# Patient Record
Sex: Female | Born: 1935 | Race: White | Hispanic: No | Smoking: Former smoker
Health system: Southern US, Community
[De-identification: ages and names within clinical notes are randomized; demographics above are authoritative.]

## PROBLEM LIST (undated history)

## (undated) DIAGNOSIS — E785 Hyperlipidemia, unspecified: Secondary | ICD-10-CM

## (undated) DIAGNOSIS — F419 Anxiety disorder, unspecified: Secondary | ICD-10-CM

## (undated) DIAGNOSIS — Z923 Personal history of irradiation: Secondary | ICD-10-CM

## (undated) DIAGNOSIS — I5022 Chronic systolic (congestive) heart failure: Secondary | ICD-10-CM

## (undated) DIAGNOSIS — K219 Gastro-esophageal reflux disease without esophagitis: Secondary | ICD-10-CM

## (undated) DIAGNOSIS — D649 Anemia, unspecified: Secondary | ICD-10-CM

## (undated) DIAGNOSIS — J849 Interstitial pulmonary disease, unspecified: Secondary | ICD-10-CM

## (undated) DIAGNOSIS — M8580 Other specified disorders of bone density and structure, unspecified site: Secondary | ICD-10-CM

## (undated) DIAGNOSIS — I255 Ischemic cardiomyopathy: Secondary | ICD-10-CM

## (undated) DIAGNOSIS — R55 Syncope and collapse: Secondary | ICD-10-CM

## (undated) DIAGNOSIS — C50919 Malignant neoplasm of unspecified site of unspecified female breast: Secondary | ICD-10-CM

## (undated) DIAGNOSIS — I2510 Atherosclerotic heart disease of native coronary artery without angina pectoris: Secondary | ICD-10-CM

## (undated) DIAGNOSIS — I10 Essential (primary) hypertension: Secondary | ICD-10-CM

## (undated) HISTORY — DX: Interstitial pulmonary disease, unspecified: J84.9

## (undated) HISTORY — DX: Other specified disorders of bone density and structure, unspecified site: M85.80

## (undated) HISTORY — PX: OVARIAN CYST SURGERY: SHX726

## (undated) HISTORY — DX: Chronic systolic (congestive) heart failure: I50.22

## (undated) HISTORY — DX: Anxiety disorder, unspecified: F41.9

## (undated) HISTORY — PX: CHOLECYSTECTOMY: SHX55

## (undated) HISTORY — DX: Hyperlipidemia, unspecified: E78.5

## (undated) HISTORY — DX: Anemia, unspecified: D64.9

## (undated) HISTORY — DX: Essential (primary) hypertension: I10

## (undated) HISTORY — DX: Syncope and collapse: R55

## (undated) HISTORY — DX: Ischemic cardiomyopathy: I25.5

## (undated) HISTORY — PX: APPENDECTOMY: SHX54

## (undated) HISTORY — DX: Gastro-esophageal reflux disease without esophagitis: K21.9

## (undated) HISTORY — DX: Atherosclerotic heart disease of native coronary artery without angina pectoris: I25.10

---

## 2001-07-02 ENCOUNTER — Other Ambulatory Visit: Admission: RE | Admit: 2001-07-02 | Discharge: 2001-07-02 | Payer: Self-pay | Admitting: Family Medicine

## 2004-03-11 HISTORY — PX: FEMUR FRACTURE SURGERY: SHX633

## 2004-07-13 ENCOUNTER — Other Ambulatory Visit: Payer: Self-pay

## 2004-07-13 ENCOUNTER — Inpatient Hospital Stay: Payer: Self-pay | Admitting: Specialist

## 2004-11-05 ENCOUNTER — Ambulatory Visit: Payer: Self-pay | Admitting: Family Medicine

## 2004-11-05 ENCOUNTER — Other Ambulatory Visit: Admission: RE | Admit: 2004-11-05 | Discharge: 2004-11-05 | Payer: Self-pay | Admitting: Family Medicine

## 2004-11-06 ENCOUNTER — Encounter: Payer: Self-pay | Admitting: Family Medicine

## 2004-11-06 LAB — CONVERTED CEMR LAB: Pap Smear: NORMAL

## 2004-11-08 ENCOUNTER — Ambulatory Visit: Payer: Self-pay | Admitting: Family Medicine

## 2004-11-13 ENCOUNTER — Ambulatory Visit: Payer: Self-pay | Admitting: Family Medicine

## 2004-11-26 LAB — FECAL OCCULT BLOOD, GUAIAC: Fecal Occult Blood: NEGATIVE

## 2004-11-27 ENCOUNTER — Ambulatory Visit: Payer: Self-pay | Admitting: Family Medicine

## 2004-12-19 ENCOUNTER — Ambulatory Visit: Payer: Self-pay | Admitting: Family Medicine

## 2005-01-30 ENCOUNTER — Ambulatory Visit: Payer: Self-pay | Admitting: Family Medicine

## 2005-03-01 ENCOUNTER — Ambulatory Visit: Payer: Self-pay | Admitting: Family Medicine

## 2005-04-09 ENCOUNTER — Ambulatory Visit: Payer: Self-pay | Admitting: Family Medicine

## 2005-05-10 ENCOUNTER — Ambulatory Visit: Payer: Self-pay | Admitting: Family Medicine

## 2005-06-26 ENCOUNTER — Ambulatory Visit: Payer: Self-pay | Admitting: Family Medicine

## 2005-08-07 ENCOUNTER — Ambulatory Visit: Payer: Self-pay | Admitting: Family Medicine

## 2005-09-09 ENCOUNTER — Ambulatory Visit: Payer: Self-pay | Admitting: Family Medicine

## 2005-10-10 ENCOUNTER — Ambulatory Visit: Payer: Self-pay | Admitting: Family Medicine

## 2005-11-25 ENCOUNTER — Ambulatory Visit: Payer: Self-pay | Admitting: Family Medicine

## 2005-12-30 ENCOUNTER — Ambulatory Visit: Payer: Self-pay | Admitting: Family Medicine

## 2006-01-20 ENCOUNTER — Ambulatory Visit: Payer: Self-pay | Admitting: Family Medicine

## 2006-01-28 ENCOUNTER — Ambulatory Visit: Payer: Self-pay | Admitting: Family Medicine

## 2006-04-01 ENCOUNTER — Ambulatory Visit: Payer: Self-pay | Admitting: Family Medicine

## 2006-04-08 ENCOUNTER — Ambulatory Visit: Payer: Self-pay | Admitting: Family Medicine

## 2006-04-11 ENCOUNTER — Ambulatory Visit: Payer: Self-pay | Admitting: Family Medicine

## 2006-07-01 ENCOUNTER — Ambulatory Visit: Payer: Self-pay | Admitting: Family Medicine

## 2006-09-24 ENCOUNTER — Encounter: Payer: Self-pay | Admitting: Family Medicine

## 2006-09-24 DIAGNOSIS — E538 Deficiency of other specified B group vitamins: Secondary | ICD-10-CM | POA: Insufficient documentation

## 2006-09-24 DIAGNOSIS — M810 Age-related osteoporosis without current pathological fracture: Secondary | ICD-10-CM | POA: Insufficient documentation

## 2006-09-24 DIAGNOSIS — J309 Allergic rhinitis, unspecified: Secondary | ICD-10-CM | POA: Insufficient documentation

## 2006-09-24 DIAGNOSIS — K219 Gastro-esophageal reflux disease without esophagitis: Secondary | ICD-10-CM | POA: Insufficient documentation

## 2006-09-24 DIAGNOSIS — I872 Venous insufficiency (chronic) (peripheral): Secondary | ICD-10-CM | POA: Insufficient documentation

## 2006-09-24 DIAGNOSIS — E785 Hyperlipidemia, unspecified: Secondary | ICD-10-CM | POA: Insufficient documentation

## 2006-09-30 ENCOUNTER — Ambulatory Visit: Payer: Self-pay | Admitting: Family Medicine

## 2006-12-31 ENCOUNTER — Ambulatory Visit: Payer: Self-pay | Admitting: Family Medicine

## 2007-03-30 ENCOUNTER — Ambulatory Visit: Payer: Self-pay | Admitting: Family Medicine

## 2007-06-29 ENCOUNTER — Ambulatory Visit: Payer: Self-pay | Admitting: Family Medicine

## 2007-09-25 ENCOUNTER — Ambulatory Visit: Payer: Self-pay | Admitting: Family Medicine

## 2007-09-25 DIAGNOSIS — I10 Essential (primary) hypertension: Secondary | ICD-10-CM | POA: Insufficient documentation

## 2007-09-28 LAB — CONVERTED CEMR LAB
ALT: 16 units/L (ref 0–35)
AST: 18 units/L (ref 0–37)
Albumin: 4.1 g/dL (ref 3.5–5.2)
Alkaline Phosphatase: 68 units/L (ref 39–117)
BUN: 9 mg/dL (ref 6–23)
Basophils Absolute: 0 10*3/uL (ref 0.0–0.1)
Basophils Relative: 0.4 % (ref 0.0–3.0)
Bilirubin, Direct: 0.1 mg/dL (ref 0.0–0.3)
CO2: 26 meq/L (ref 19–32)
Calcium: 9.4 mg/dL (ref 8.4–10.5)
Chloride: 105 meq/L (ref 96–112)
Cholesterol: 217 mg/dL (ref 0–200)
Creatinine, Ser: 0.8 mg/dL (ref 0.4–1.2)
Direct LDL: 127.1 mg/dL
Eosinophils Absolute: 0.1 10*3/uL (ref 0.0–0.7)
Eosinophils Relative: 1.4 % (ref 0.0–5.0)
GFR calc Af Amer: 90 mL/min
GFR calc non Af Amer: 75 mL/min
Glucose, Bld: 98 mg/dL (ref 70–99)
HCT: 34.2 % — ABNORMAL LOW (ref 36.0–46.0)
HDL: 48.2 mg/dL (ref >39.0)
Hemoglobin: 11.7 g/dL — ABNORMAL LOW (ref 12.0–15.0)
Lymphocytes Relative: 43.6 % (ref 12.0–46.0)
MCHC: 34.2 g/dL (ref 30.0–36.0)
MCV: 83.1 fL (ref 78.0–100.0)
Monocytes Absolute: 0.4 10*3/uL (ref 0.1–1.0)
Monocytes Relative: 7.4 % (ref 3.0–12.0)
Neutro Abs: 2.7 10*3/uL (ref 1.4–7.7)
Neutrophils Relative %: 47.2 % (ref 43.0–77.0)
Phosphorus: 4.3 mg/dL (ref 2.3–4.6)
Platelets: 208 10*3/uL (ref 150–400)
Potassium: 4.2 meq/L (ref 3.5–5.1)
RBC: 4.12 M/uL (ref 3.87–5.11)
RDW: 15.5 % — ABNORMAL HIGH (ref 11.5–14.6)
Sodium: 138 meq/L (ref 135–145)
TSH: 1.73 microintl units/mL (ref 0.35–5.50)
Total Bilirubin: 0.8 mg/dL (ref 0.3–1.2)
Total CHOL/HDL Ratio: 4.5
Total Protein: 8 g/dL (ref 6.0–8.3)
Triglycerides: 144 mg/dL (ref 0–149)
VLDL: 29 mg/dL (ref 0–40)
WBC: 5.6 10*3/uL (ref 4.5–10.5)

## 2007-09-29 ENCOUNTER — Encounter (INDEPENDENT_AMBULATORY_CARE_PROVIDER_SITE_OTHER): Payer: Self-pay | Admitting: *Deleted

## 2007-09-29 LAB — CONVERTED CEMR LAB: Vit D, 1,25-Dihydroxy: 21 — ABNORMAL LOW (ref 30–89)

## 2007-10-06 ENCOUNTER — Encounter: Payer: Self-pay | Admitting: Family Medicine

## 2007-10-06 ENCOUNTER — Ambulatory Visit: Payer: Self-pay | Admitting: Family Medicine

## 2007-10-06 LAB — HM MAMMOGRAPHY: HM Mammogram: NORMAL

## 2007-12-08 ENCOUNTER — Ambulatory Visit: Payer: Self-pay | Admitting: Family Medicine

## 2007-12-11 LAB — CONVERTED CEMR LAB: Vit D, 1,25-Dihydroxy: 23 — ABNORMAL LOW (ref 30–89)

## 2008-01-08 ENCOUNTER — Telehealth: Payer: Self-pay | Admitting: Family Medicine

## 2008-01-10 ENCOUNTER — Emergency Department: Payer: Self-pay | Admitting: Emergency Medicine

## 2008-01-20 ENCOUNTER — Ambulatory Visit: Payer: Self-pay | Admitting: Family Medicine

## 2008-01-21 ENCOUNTER — Telehealth: Payer: Self-pay | Admitting: Family Medicine

## 2008-01-22 ENCOUNTER — Telehealth: Payer: Self-pay | Admitting: Family Medicine

## 2008-01-29 ENCOUNTER — Ambulatory Visit: Payer: Self-pay | Admitting: Family Medicine

## 2008-01-29 DIAGNOSIS — F419 Anxiety disorder, unspecified: Secondary | ICD-10-CM | POA: Insufficient documentation

## 2008-01-29 DIAGNOSIS — F411 Generalized anxiety disorder: Secondary | ICD-10-CM | POA: Insufficient documentation

## 2008-02-19 ENCOUNTER — Ambulatory Visit: Payer: Self-pay | Admitting: Family Medicine

## 2008-03-24 ENCOUNTER — Ambulatory Visit: Payer: Self-pay | Admitting: Family Medicine

## 2008-03-28 LAB — CONVERTED CEMR LAB
ALT: 14 units/L (ref 0–35)
AST: 17 units/L (ref 0–37)
Albumin: 4.1 g/dL (ref 3.5–5.2)
BUN: 11 mg/dL (ref 6–23)
CO2: 29 meq/L (ref 19–32)
Calcium: 9.7 mg/dL (ref 8.4–10.5)
Chloride: 100 meq/L (ref 96–112)
Cholesterol: 224 mg/dL (ref 0–200)
Creatinine, Ser: 0.8 mg/dL (ref 0.4–1.2)
Direct LDL: 129.8 mg/dL
GFR calc Af Amer: 90 mL/min
GFR calc non Af Amer: 75 mL/min
Glucose, Bld: 102 mg/dL — ABNORMAL HIGH (ref 70–99)
HDL: 63.6 mg/dL (ref >39.0)
Phosphorus: 4.1 mg/dL (ref 2.3–4.6)
Potassium: 3.7 meq/L (ref 3.5–5.1)
Sodium: 138 meq/L (ref 135–145)
Total CHOL/HDL Ratio: 3.5
Triglycerides: 94 mg/dL (ref 0–149)
VLDL: 19 mg/dL (ref 0–40)
Vit D, 1,25-Dihydroxy: 30 (ref 30–89)

## 2008-08-09 ENCOUNTER — Ambulatory Visit: Payer: Self-pay | Admitting: Family Medicine

## 2008-08-19 ENCOUNTER — Ambulatory Visit: Payer: Self-pay | Admitting: Family Medicine

## 2008-08-19 DIAGNOSIS — E559 Vitamin D deficiency, unspecified: Secondary | ICD-10-CM | POA: Insufficient documentation

## 2008-08-26 LAB — CONVERTED CEMR LAB: Vit D, 25-Hydroxy: 38 ng/mL (ref 30–89)

## 2008-10-31 ENCOUNTER — Telehealth: Payer: Self-pay | Admitting: Family Medicine

## 2008-11-14 ENCOUNTER — Ambulatory Visit: Payer: Self-pay | Admitting: Cardiology

## 2008-11-14 ENCOUNTER — Encounter (INDEPENDENT_AMBULATORY_CARE_PROVIDER_SITE_OTHER): Payer: Self-pay | Admitting: Internal Medicine

## 2008-11-14 ENCOUNTER — Inpatient Hospital Stay (HOSPITAL_COMMUNITY): Admission: EM | Admit: 2008-11-14 | Discharge: 2008-11-15 | Payer: Self-pay | Admitting: Emergency Medicine

## 2008-11-15 ENCOUNTER — Encounter (INDEPENDENT_AMBULATORY_CARE_PROVIDER_SITE_OTHER): Payer: Self-pay | Admitting: Internal Medicine

## 2008-11-15 ENCOUNTER — Ambulatory Visit: Payer: Self-pay | Admitting: Surgery

## 2008-11-16 ENCOUNTER — Ambulatory Visit: Payer: Self-pay | Admitting: Family Medicine

## 2008-11-16 ENCOUNTER — Encounter: Payer: Self-pay | Admitting: Family Medicine

## 2008-11-21 ENCOUNTER — Ambulatory Visit: Payer: Self-pay | Admitting: Family Medicine

## 2008-11-22 ENCOUNTER — Encounter: Payer: Self-pay | Admitting: Family Medicine

## 2008-11-22 ENCOUNTER — Ambulatory Visit: Payer: Self-pay | Admitting: Family Medicine

## 2008-11-22 LAB — CONVERTED CEMR LAB
Albumin: 4 g/dL (ref 3.5–5.2)
BUN: 8 mg/dL (ref 6–23)
CO2: 28 meq/L (ref 19–32)
Calcium: 9.4 mg/dL (ref 8.4–10.5)
Chloride: 104 meq/L (ref 96–112)
Creatinine, Ser: 0.8 mg/dL (ref 0.4–1.2)
Glucose, Bld: 95 mg/dL (ref 70–99)
Phosphorus: 4.4 mg/dL (ref 2.3–4.6)
Potassium: 4.4 meq/L (ref 3.5–5.1)
Sodium: 138 meq/L (ref 135–145)

## 2008-11-28 ENCOUNTER — Encounter (INDEPENDENT_AMBULATORY_CARE_PROVIDER_SITE_OTHER): Payer: Self-pay | Admitting: General Surgery

## 2008-11-28 ENCOUNTER — Encounter: Payer: Self-pay | Admitting: Family Medicine

## 2008-11-28 ENCOUNTER — Encounter: Admission: RE | Admit: 2008-11-28 | Discharge: 2008-11-28 | Payer: Self-pay | Admitting: General Surgery

## 2008-11-29 HISTORY — PX: BREAST BIOPSY: SHX20

## 2008-12-08 ENCOUNTER — Encounter: Payer: Self-pay | Admitting: Family Medicine

## 2008-12-09 ENCOUNTER — Telehealth: Payer: Self-pay | Admitting: Family Medicine

## 2008-12-09 HISTORY — PX: BREAST SURGERY: SHX581

## 2008-12-22 ENCOUNTER — Ambulatory Visit: Payer: Self-pay | Admitting: Family Medicine

## 2008-12-26 ENCOUNTER — Encounter: Admission: RE | Admit: 2008-12-26 | Discharge: 2008-12-26 | Payer: Self-pay | Admitting: General Surgery

## 2008-12-26 HISTORY — PX: BREAST EXCISIONAL BIOPSY: SUR124

## 2009-01-09 ENCOUNTER — Encounter: Payer: Self-pay | Admitting: Family Medicine

## 2009-02-01 ENCOUNTER — Ambulatory Visit: Payer: Self-pay | Admitting: Family Medicine

## 2009-02-13 ENCOUNTER — Encounter: Payer: Self-pay | Admitting: Family Medicine

## 2009-02-28 ENCOUNTER — Ambulatory Visit: Payer: Self-pay | Admitting: Family Medicine

## 2009-04-28 ENCOUNTER — Telehealth: Payer: Self-pay | Admitting: Family Medicine

## 2009-05-03 ENCOUNTER — Ambulatory Visit: Payer: Self-pay | Admitting: Family Medicine

## 2009-06-06 ENCOUNTER — Ambulatory Visit: Payer: Self-pay | Admitting: Family Medicine

## 2009-08-01 ENCOUNTER — Ambulatory Visit: Payer: Self-pay | Admitting: Family Medicine

## 2009-08-01 DIAGNOSIS — F4321 Adjustment disorder with depressed mood: Secondary | ICD-10-CM | POA: Insufficient documentation

## 2009-08-16 ENCOUNTER — Telehealth: Payer: Self-pay | Admitting: Family Medicine

## 2009-08-18 ENCOUNTER — Ambulatory Visit: Payer: Self-pay | Admitting: Family Medicine

## 2009-09-05 ENCOUNTER — Ambulatory Visit: Payer: Self-pay | Admitting: Family Medicine

## 2009-10-16 ENCOUNTER — Ambulatory Visit: Payer: Self-pay | Admitting: Family Medicine

## 2009-11-19 IMAGING — MG MM BREAST NEEDLE LOCALIZATION*L*
3 series · 3 of 3 positions shown · non-contrast
Comparison: none

CLINICAL DATA: Recent diagnosis of sclerosing ductal papilloma in
the left breast.

[L CC]
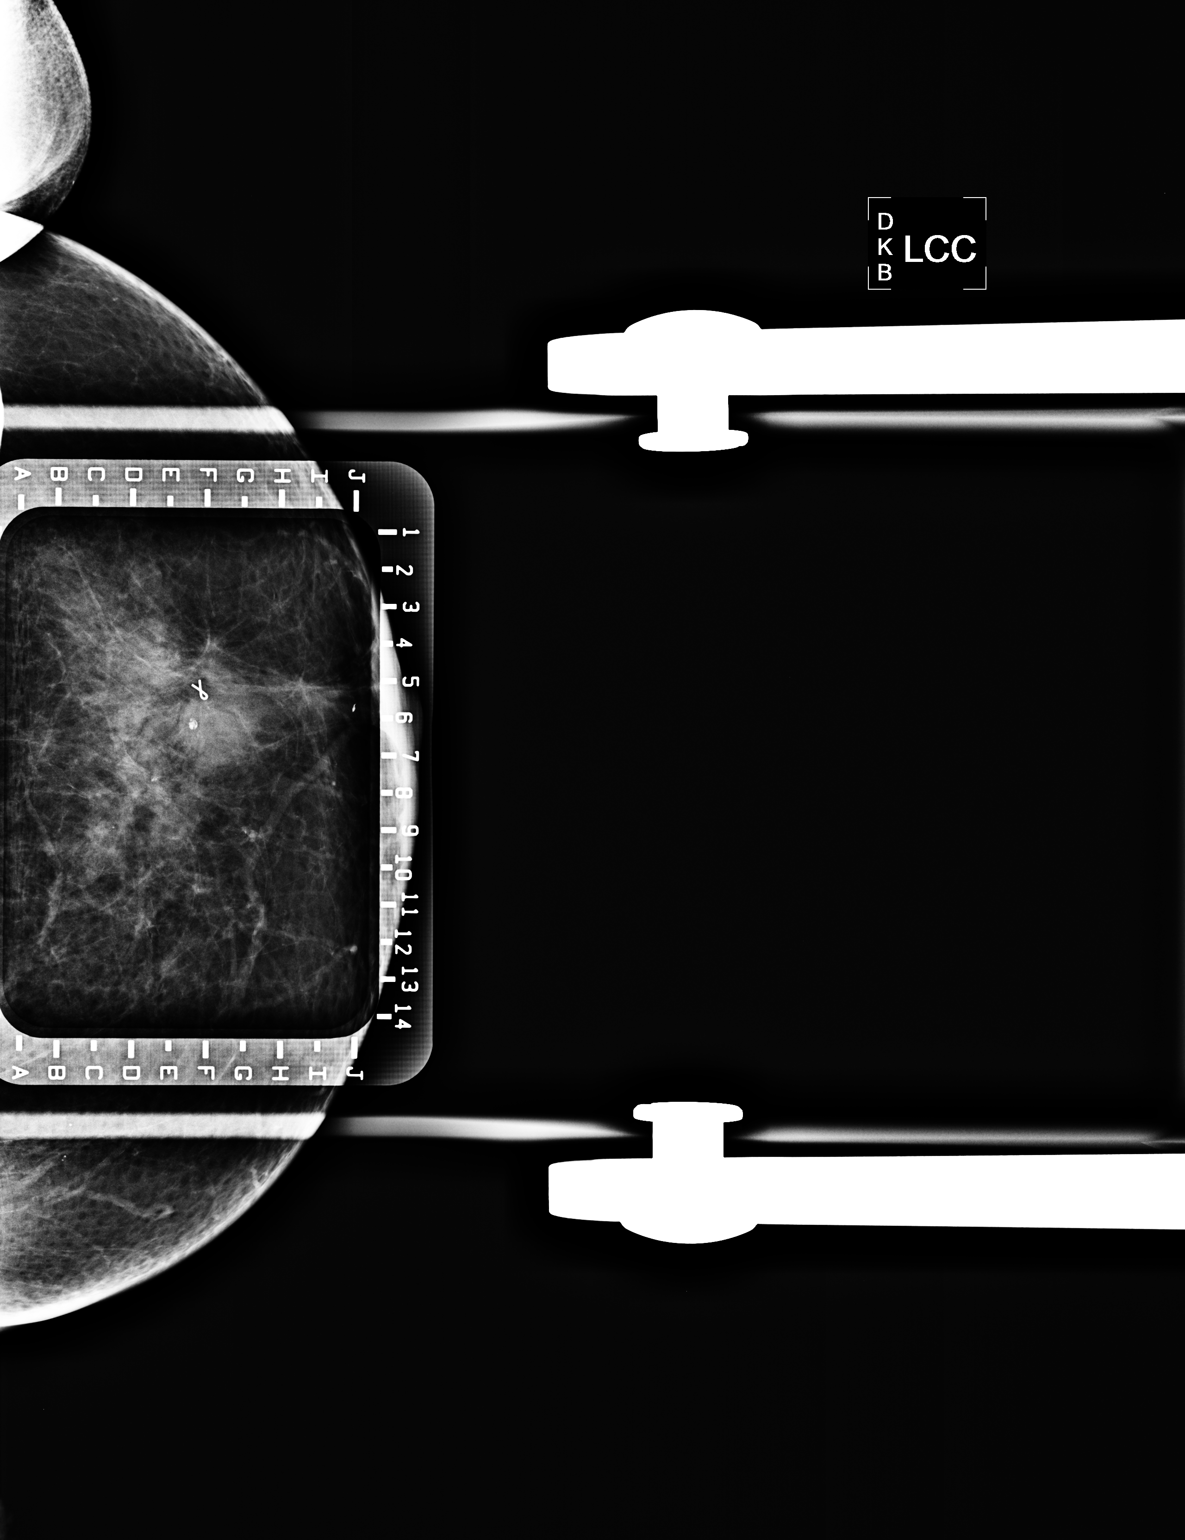

[L ML (1 of 2)]
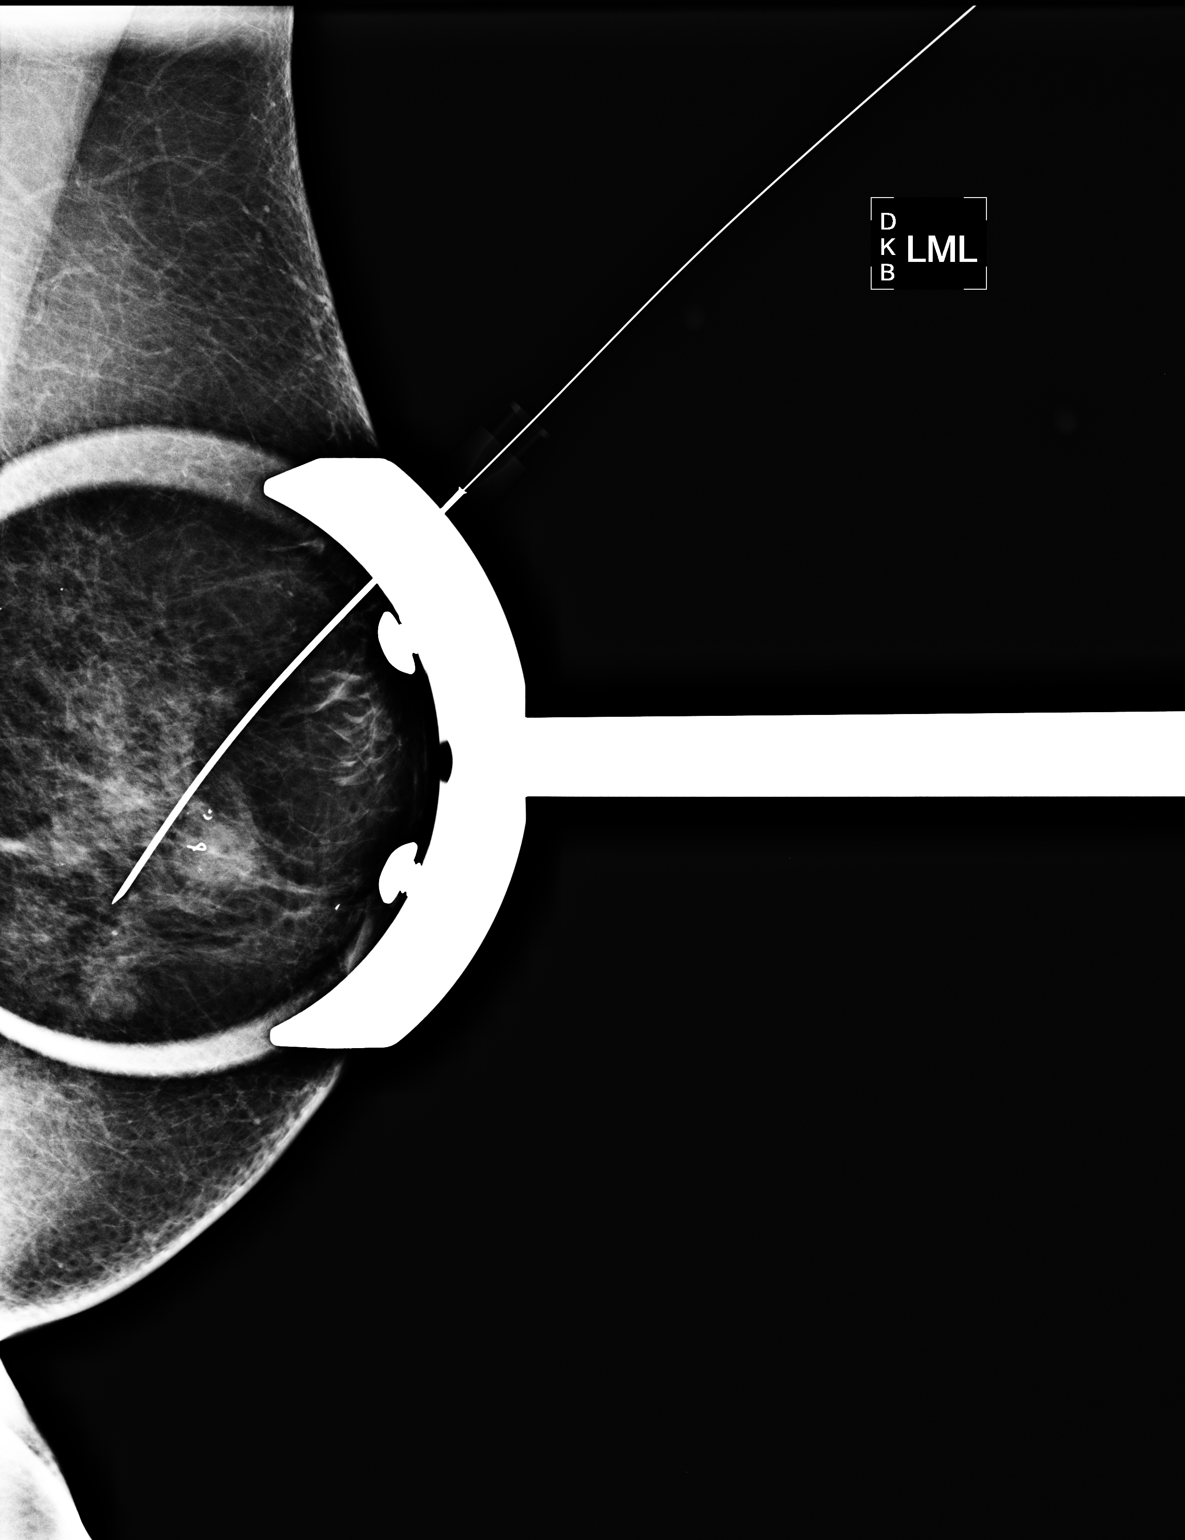

[L ML (2 of 2)]
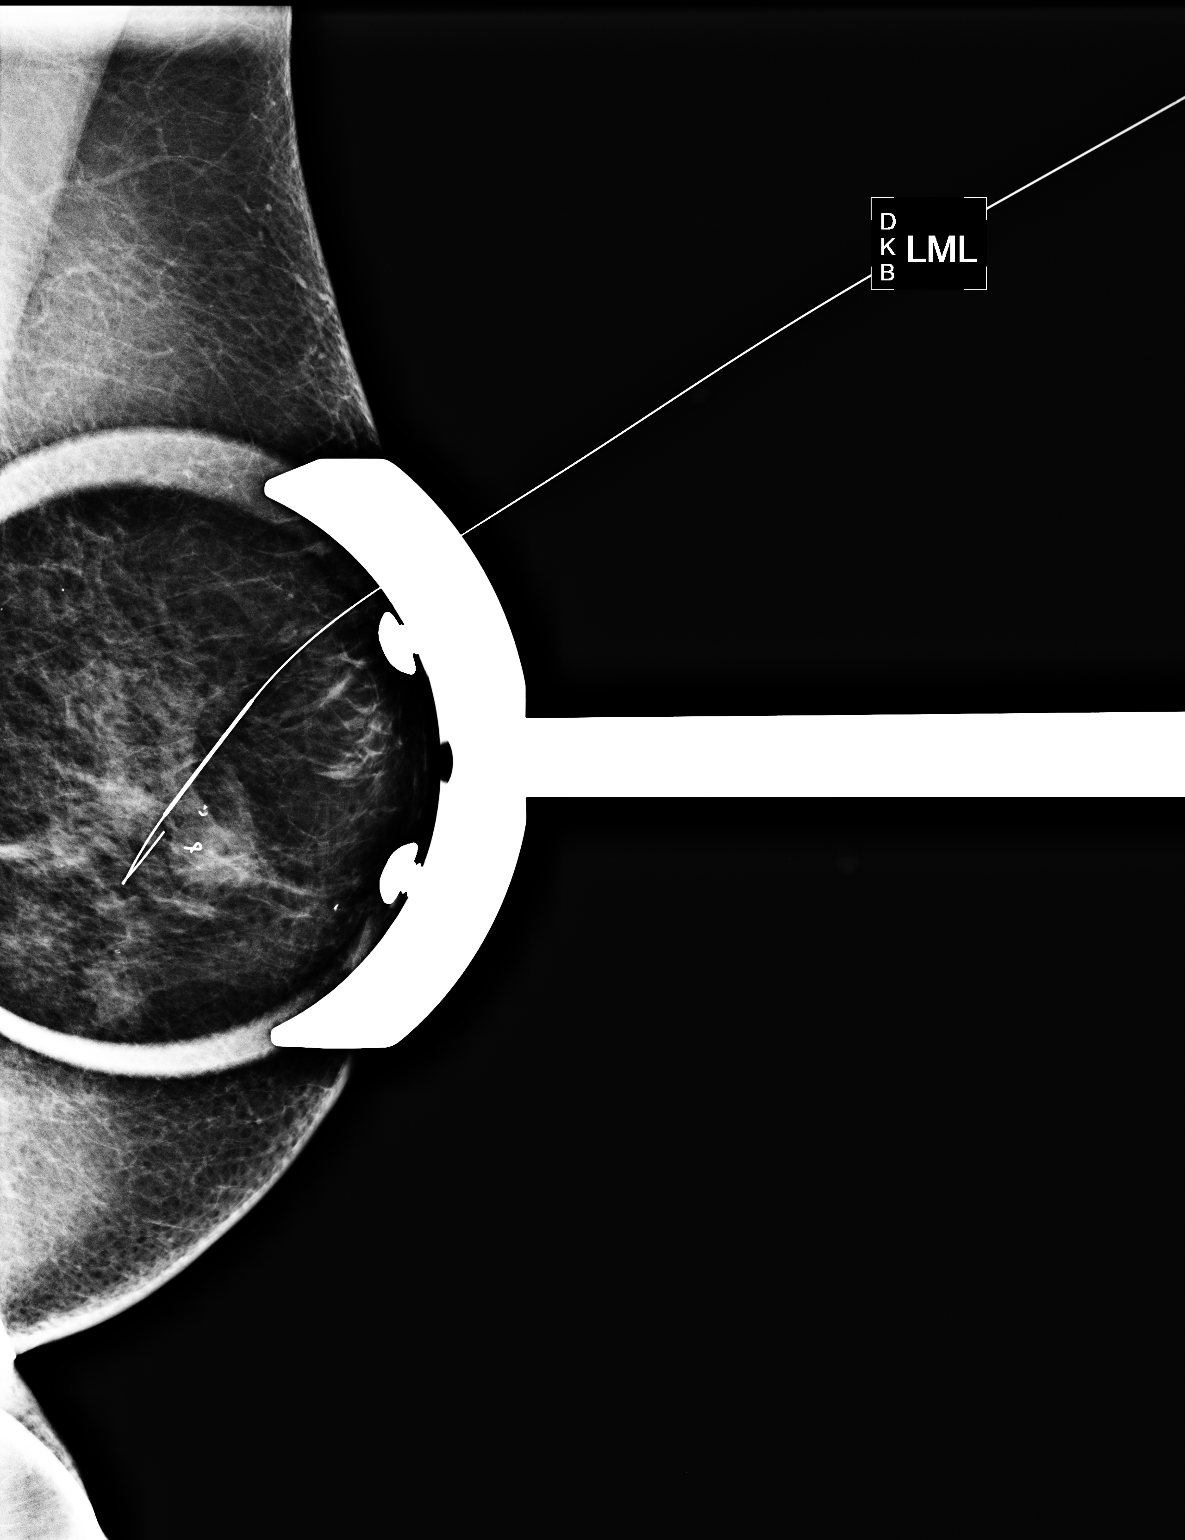

[3 of 3 positions shown; findings below may reference images not displayed]

LEFT BREAST NEEDLE LOCALIZATION WITH MAMMOGRAPHIC GUIDANCE AND
SPECIMEN RADIOGRAPH

Patient presents for needle localization prior to surgical
excision.  I met with the patient and we discussed the procedure of
needle localization including risks, benefits, and alternatives.
Specifically, we discussed the risks of infection, bleeding, tissue
injury, and inadequate sampling. Informed written consent was
given.

Using mammographic guidance, sterile technique, 2% lidocaine and a
7 cm modified Kopans needle, the clip and mass in the left
subareolar region was localized using a craniocaudal approach.
Films were labeled and sent with the patient surgery.  She
tolerated procedure well.

Specimen radiograph was performed at the [REDACTED] [REDACTED], and confirms the mass clip and wire to be present in the
tissue sample.  The specimen is marked for pathology.
IMPRESSION: Needle localization left breast.  No apparent complications.

## 2009-12-13 ENCOUNTER — Ambulatory Visit: Payer: Self-pay | Admitting: Family Medicine

## 2009-12-20 ENCOUNTER — Encounter: Admission: RE | Admit: 2009-12-20 | Discharge: 2009-12-20 | Payer: Self-pay | Admitting: General Surgery

## 2010-03-22 ENCOUNTER — Telehealth: Payer: Self-pay | Admitting: Family Medicine

## 2010-04-05 ENCOUNTER — Ambulatory Visit
Admission: RE | Admit: 2010-04-05 | Discharge: 2010-04-05 | Payer: Self-pay | Source: Home / Self Care | Attending: Family Medicine | Admitting: Family Medicine

## 2010-04-09 ENCOUNTER — Ambulatory Visit
Admission: RE | Admit: 2010-04-09 | Discharge: 2010-04-09 | Payer: Self-pay | Source: Home / Self Care | Attending: Family Medicine | Admitting: Family Medicine

## 2010-04-10 NOTE — Assessment & Plan Note (Signed)
Summary: B12 SHOT / LFW   Nurse Visit    Prior Medications: OSCAL 500/200 D-3 500-200 MG-UNIT  TABS (CALCIUM-VITAMIN D) take two by mouth daily VITAMIN B-12 CR 1000 MCG  TBCR (CYANOCOBALAMIN) one injection q 3 months ALTACE 2.5 MG  CAPS (RAMIPRIL) one by mouth daily PEPCID AC 10 MG  TABS (FAMOTIDINE) 1 AT BEDTIME BY MOUTH Current Allergies: ! PREVACID PENICILLIN CODEINE    Medication Administration  Injection # 1:    Medication: Vit B12 1000 mcg    Diagnosis: VITAMIN B12 DEFICIENCY (ICD-266.2)    Route: IM    Site: R deltoid    Exp Date: 08/08/2009    Lot #: [id number]    Mfr: American Regent    Patient tolerated injection without complications    Given by: [name] (December 08, 2007 8:22 AM)  Orders Added: 1)  Flu Vaccine 36yrs + [54098] 2)  Administration Flu vaccine [G0008] 3)  Vit B12 1000 mcg [J3420] 4)  Admin of Therapeutic Inj  intramuscular or subcutaneous Lepidus.Putnam    ]                  Flu Vaccine Consent Questions     Do you have a history of severe allergic reactions to this vaccine? no    Any prior history of allergic reactions to egg and/or gelatin? no    Do you have a sensitivity to the preservative Thimersol? no    Do you have a past history of Guillan-Barre Syndrome? no    Do you currently have an acute febrile illness? no    Have you ever had a severe reaction to latex? no    Vaccine information given and explained to patient? yes    Are you currently pregnant? no    Lot Number:[id number]   Site Given  Left Deltoid IM

## 2010-04-10 NOTE — Assessment & Plan Note (Signed)
Summary: B12 INJECTION  CYD   Nurse Visit   Allergies: 1)  ! Prevacid 2)  ! * Buspar 3)  ! Minocin (Minocycline Hcl) 4)  Penicillin 5)  Codeine  Medication Administration  Injection # 1:    Medication: Vit B12 1000 mcg    Diagnosis: Hx of VITAMIN B12 DEFICIENCY (ICD-266.2)    Route: IM    Site: L deltoid    Exp Date: 01/10/2011    Lot #: [id number]    Mfr: American Regent    Patient tolerated injection without complications    Given by: Linde Gillis CMA (AAMA) (June 06, 2009 12:00 PM)  Orders Added: 1)  Vit B12 1000 mcg [J3420] 2)  Admin of Therapeutic Inj  intramuscular or subcutaneous [11914]

## 2010-04-10 NOTE — Assessment & Plan Note (Signed)
Summary: CPX/HEA   Vital Signs:  Patient Profile:   77 Years Old Female Height:     62 inches Weight:      137 pounds Temp:     97.9 degrees F oral Pulse rate:   76 / minute Pulse rhythm:   regular Resp:     20 per minute BP sitting:   134 / 70  (right arm) Cuff size:   regular  Vitals Entered By: Providence Crosby (September 25, 2007 10:27 AM)                 Chief Complaint:  CHECK UP// HEMOCCULT CARDS TO PATIENT.  History of Present Illness: is doing well overall   some stress- husb dx with AAA could not operate due to bp problems-- will [facility]   prevacid gave her abd pain -- but pepcid works great for heartburn  takes ca and vitamin D  walks a mile a day- to tow healthy diet -- works on wt [place]  no gyn problems- nl pap in 06, no new partners no lumps on self breast exam-- due for mammogram is not interested in colonoscopy  no bowel changes or blood in her stool  refuses Td -- her mother had problems with shot in the past so she is apprehsive  gets flu shot no pneumovax   is not interested in tx or further monitoring of osteoporosis-- wishes not to have dexa          Prior Medications Reviewed Using: Patient Recall  Current Allergies (reviewed today): ! PREVACID PENICILLIN CODEINE   Family History:    Father: heart problems    Mother: HTN    Siblings:     sister with macular deg    sister OP    aunt DM    aunt throat ca       Social History:    Marital Status: Married    Children: 2    Occupation: part time Haematologist    non smoker     no alcohol     Review of Systems  General      Denies fatigue, loss of appetite, malaise, and weakness.  Eyes      Denies blurring and eye pain.  CV      Denies chest pain or discomfort, palpitations, and shortness of breath with exertion.  Resp      Denies cough and shortness of breath.  GI      Denies abdominal pain, bloody stools, and change in bowel habits.  GU      Denies discharge and  dysuria.  Derm      Denies itching, lesion(s), and rash.  Neuro      Denies numbness and tingling.  Psych      Denies anxiety and depression.  Endo      Denies excessive thirst and excessive urination.     Impression & Recommendations:  Problem # 1:  VITAMIN B12 DEFICIENCY (ICD-266.2) q 3 mo B 12 shot given today pt feels good with nl energy level and good nutrition Orders: Vit B12 1000 mcg (J3420) Admin of Therapeutic Inj  intramuscular or subcutaneous (16109)   Problem # 2:  OSTEOPOROSIS (ICD-733.00) pt refuses further monitoring and tx did agree to vit D level today-- added to labs  adv re: ca and vit D and exercise-- and safety with inc fx risk Her updated medication list for this problem includes:    Oscal 500/200 D-3 500-200 Mg-unit Tabs (Calcium-vitamin  d) ..... Take two by mouth daily  Orders: T-Vitamin D (25-Hydroxy) [phone])   Problem # 3:  HYPERLIPIDEMIA (ICD-272.4) has been controlled with diet labs today and advise Orders: Venipuncture (14782) TLB-Lipid Panel (80061-LIPID) TLB-Renal Function Panel (80069-RENAL) TLB-CBC Platelet - w/Differential (85025-CBCD) TLB-Hepatic/Liver Function Pnl (80076-HEPATIC)   Problem # 4:  GERD (ICD-530.81) good control of symptoms with daily pepcid and good diet  The following medications were removed from the medication list:    Prevacid Solutab 30 Mg Tbdp (Lansoprazole) ..... One by mouth tid  Her updated medication list for this problem includes:    Pepcid Ac 10 Mg Tabs (Famotidine) .Marland Kitchen... 1 at bedtime by mouth   Complete Medication List: 1)  Oscal 500/200 D-3 500-200 Mg-unit Tabs (Calcium-vitamin d) .... Take two by mouth daily 2)  Vitamin B-12 Cr 1000 Mcg Tbcr (Cyanocobalamin) .... One injection q 3 months 3)  Altace 2.5 Mg Caps (Ramipril) .... One by mouth daily 4)  Pepcid Ac 10 Mg Tabs (Famotidine) .Marland Kitchen.. 1 at bedtime by mouth  Other Orders: TLB-TSH (Thyroid Stimulating Hormone) [phone])  Radiology Referral (Radiology)   Patient Instructions: 1)  if you are interested in shingles vaccine in future -- zostavax-- check with your insurance first and you can call us to schedule 2)  the current recommendation for calcium intake is 1200-1500 mg daily with [phone] IU of vitamin D 3)  keep up the good work with diet and exercise  4)  we will set up mammogram at check out   Prescriptions: ALTACE 2.5 MG  CAPS (RAMIPRIL) one by mouth daily  #90 x 3   Entered and Authorized by:   Judith Part MD   Signed by:   Judith Part MD on 09/25/2007   Method used:   Print then Give to Patient   RxID:   [id number]  ]  Preventive Care Screening  Last Flu Shot:    Date:  12/10/2006    Results:  given   Bone Density:    Date:  11/09/2004    Results:  abnormal std dev     refuses td at this time --09          Medication Administration  Injection # 1:    Medication: Vit B12 1000 mcg    Diagnosis: VITAMIN B12 DEFICIENCY (ICD-266.2)    Route: IM    Site: L deltoid    Exp Date: 04/2009    Lot #: [id number]    Mfr: American Regent    Patient tolerated injection without complications  Orders Added: 1)  Venipuncture [36415] 2)  TLB-Lipid Panel [80061-LIPID] 3)  TLB-Renal Function Panel [80069-RENAL] 4)  TLB-CBC Platelet - w/Differential [85025-CBCD] 5)  TLB-Hepatic/Liver Function Pnl [80076-HEPATIC] 6)  TLB-TSH (Thyroid Stimulating Hormone) [84443-TSH] 7)  T-Vitamin D (25-Hydroxy) [10272-53664] 8)  Radiology Referral [Radiology] 9)  Vit B12 1000 mcg [J3420] 10)  Admin of Therapeutic Inj  intramuscular or subcutaneous [40347]

## 2010-04-10 NOTE — Assessment & Plan Note (Signed)
Summary: 8 Week follow-up   Vital Signs:  Patient profile:   77 year old female Height:      62.5 inches Weight:      134.25 pounds BMI:     24.25 Temp:     98 degrees F oral Pulse rate:   80 / minute Pulse rhythm:   regular BP sitting:   136 / 70  (left arm) Cuff size:   regular  Vitals Entered By: Lewanda Rife LPN (December 13, 2009 12:06 PM) CC: eight week f/u   History of Present Illness: here for f/u of HTN and stress reaction   overall has been well  bp is better at home  130s/70s at home for the most part -mostly not bad at all   wt is stable   bp is ok at 136/70 on first check last visit increased her altace to 10 mg daily   stressor - keeping grandchild-- he is doing better  , did turn 20  is still grieving loss of husband  it is his birthday today- tough day   Allergies: 1)  ! Prevacid 2)  ! * Buspar 3)  ! Minocin (Minocycline Hcl) 4)  Penicillin 5)  Codeine  Past History:  Past Medical History: Last updated: 02/19/2008 Allergic rhinitis GERD Hyperlipidemia Osteopenia Osteoporosis Fear of swallowing meds (pills) anxiety HTN   Past Surgical History: Last updated: 01/18/2009 Appendectomy Caesarean section Cholecystectomy Ovarian cyst- surgery Benign breast biopsy Dexa- OP of femoral neck (04/2000) Femoral neck fracture- surgery (08-07-2004) Dexa- OP (11/2004) 2D echo 09/10 -mild diastolic dysf carotid dopplers 9/10- nl  CT head 9/10 - calcified meningioma/otherwide nl  hosp 9/10 syncope from dehydration/ and uti breast bx neg 10/10  Family History: Last updated: 09/25/2007 Father: heart problems Mother: HTN Siblings:  sister with macular deg sister OP aunt DM aunt throat ca   Social History: Last updated: 10/16/2009 Marital Status: Married husb with AAA- almost died in 08-07-2009 , made recovery then died  Children: 2 Occupation: part time bank teller non smoker  no alcohol   Risk Factors: Smoking Status: quit  (09/24/2006)  Review of Systems General:  Denies fatigue, fever, loss of appetite, and malaise. Eyes:  Denies blurring and eye irritation. CV:  Denies chest pain or discomfort, lightheadness, palpitations, shortness of breath with exertion, and swelling of feet. Resp:  Denies cough and shortness of breath. GI:  Denies abdominal pain, change in bowel habits, and indigestion. GU:  Denies dysuria and urinary frequency. MS:  Denies muscle aches and cramps. Derm:  Denies itching, lesion(s), poor wound healing, and rash. Neuro:  Denies numbness and tingling. Psych:  Complains of depression; denies anxiety, panic attacks, sense of great danger, and suicidal thoughts/plans. Endo:  Denies excessive thirst and excessive urination. Heme:  Denies abnormal bruising and bleeding.  Physical Exam  General:  slim and well appearing elderly female Head:  normocephalic, atraumatic, and no abnormalities observed.   Eyes:  vision grossly intact, pupils equal, pupils round, and pupils reactive to light.  no conjunctival pallor, injection or icterus  Mouth:  pharynx pink and moist.   Neck:  supple with full rom and no masses or thyromegally, no JVD or carotid bruit  Lungs:  Normal respiratory effort, chest expands symmetrically. Lungs are clear to auscultation, no crackles or wheezes. Heart:  Normal rate and regular rhythm. S1 and S2 normal without gallop, murmur, click, rub or other extra sounds. Abdomen:  no renal bruits  Pulses:  R and L carotid,radial,femoral,dorsalis pedis  and posterior tibial pulses are full and equal bilaterally Extremities:  No clubbing, cyanosis, edema, or deformity noted with normal full range of motion of all joints.   Neurologic:  sensation intact to light touch, gait normal, and DTRs symmetrical and normal.  no tremor  Skin:  Intact without suspicious lesions or rashes Cervical Nodes:  No lymphadenopathy noted Psych:  a bit tearful today when disc her husband's death good eye  contact and comm skills   Impression & Recommendations:  Problem # 1:  HYPERTENSION NEC (ICD-997.91) Assessment Improved this is imp with inc altace- no problems  f/u 6 mo  urged to stay active  Problem # 2:  Hx of VITAMIN B12 DEFICIENCY (ICD-266.2) Assessment: Unchanged  B12 shot today  Orders: Vit B12 1000 mcg (J3420) Admin of Therapeutic Inj  intramuscular or subcutaneous (16109)  Problem # 3:  ANXIETY (ICD-300.00) Assessment: Improved from grief rxn and also poor home situation caring for teen  again disc imp of talking to family about reducing her responsibilities  offered counseling if she wants it  overall doing quite well Her updated medication list for this problem includes:    Alprazolam 0.5 Mg Tabs (Alprazolam) .Marland Kitchen... 1 by mouth at bedtime as needed anxiety  Complete Medication List: 1)  Oscal 500/200 D-3 500-200 Mg-unit Tabs (Calcium-vitamin d) .... Take two by mouth daily 2)  Vitamin B-12 Cr 1000 Mcg Tbcr (Cyanocobalamin) .... One injection every 3 months 3)  Altace 10 Mg Caps (Ramipril) .Marland Kitchen.. 1 by mouth once daily 4)  Vitamin D 2000 Unit Tabs (Cholecalciferol) .... Take 1 tablet by mouth once a day 5)  Aspirin 325 Mg Tabs (Aspirin) .... As needed 6)  Flonase 50 Mcg/act Susp (Fluticasone propionate) .... 2 sprays in each nostril once daily as needed 7)  Antivert 25 Mg Tabs (Meclizine hcl) .Marland Kitchen.. 1 by mouth up to three times a day as needed dizziness  (warn- can sedate) 8)  Alprazolam 0.5 Mg Tabs (Alprazolam) .Marland Kitchen.. 1 by mouth at bedtime as needed anxiety  Other Orders: Flu Vaccine 18yrs + MEDICARE PATIENTS (U0454) Administration Flu vaccine - MCR (U9811) Prescription Created Electronically [phone])  Patient Instructions: 1)  B12 shot and flu shot today  2)  blood pressure is better  3)  keep working on reducing your stress and stay active  4)  no change in medicines  5)  follow up in 6 months Prescriptions: FLONASE 50 MCG/ACT SUSP (FLUTICASONE PROPIONATE) 2  sprays in each nostril once daily as needed  #75mdi x 11   Entered and Authorized by:   Judith Part MD   Signed by:   Judith Part MD on 12/13/2009   Method used:   Electronically to        [facility]. [address] [phone]* (retail)       [address] [place], [place]  [id number]       Ph: [phone]       Fax: [phone]   RxID:   [id number]   Current Allergies (reviewed today): ! PREVACID ! * BUSPAR ! MINOCIN (MINOCYCLINE HCL) PENICILLIN CODEINE   Medication Administration  Injection # 1:    Medication: Vit B12 1000 mcg    Diagnosis: Hx of VITAMIN B12 DEFICIENCY (ICD-266.2)    Route: IM    Site: R deltoid    Exp Date: 06/10/2011    Lot #: [id number]    Mfr: American Regent    Patient tolerated injection without  complications    Given by: Lewanda Rife LPN (December 13, 2009 1:11 PM)  Orders Added: 1)  Vit B12 1000 mcg [J3420] 2)  Admin of Therapeutic Inj  intramuscular or subcutaneous [96372] 3)  Flu Vaccine 80yrs + MEDICARE PATIENTS [Q2039] 4)  Administration Flu vaccine - MCR [G0008] 5)  Prescription Created Electronically [G8553] 6)  Est. Patient Level IV [09811]    Flu Vaccine Consent Questions     Do you have a history of severe allergic reactions to this vaccine? no    Any prior history of allergic reactions to egg and/or gelatin? no    Do you have a sensitivity to the preservative Thimersol? no    Do you have a past history of Guillan-Barre Syndrome? no    Do you currently have an acute febrile illness? no    Have you ever had a severe reaction to latex? no    Vaccine information given and explained to patient? yes    Are you currently pregnant? no    Lot Number:[id number]   Exp Date:09/08/2010   Site Given  Left Deltoid IMbmedflu Lewanda Rife LPN  December 13, 2009 1:12 PM

## 2010-04-10 NOTE — Assessment & Plan Note (Signed)
Summary: ?SINUS INFECTION/CLE   Vital Signs:  Patient profile:   77 year old female Height:      62.5 inches Weight:      133.75 pounds BMI:     24.16 O2 Sat:      97 % on Room air Temp:     98.7 degrees F oral Pulse rate:   76 / minute Pulse rhythm:   regular Resp:     20 per minute BP sitting:   120 / 70  (left arm) Cuff size:   regular  Vitals Entered By: Lewanda Rife (August 09, 2008 11:36 AM)  O2 Flow:  Room air  CC:  sinus infection with headache and non productive cough.  History of Present Illness: Here for UTI signs--onset x 5d HA and cugh--non-productive most of the time, fever--100.8 last night and chills, does not feel good --taking tylenol and ASA 325 mg 2 when she takes--helps --decreased appetitie and drinking OK   Allergies: 1)  ! Prevacid 2)  ! * Buspar 3)  Penicillin 4)  Codeine  Review of Systems      See HPI  Physical Exam  General:  alert, well-developed, well-nourished, and well-hydrated.  NAD Ears:  R ear normal and L ear normal.   Nose:  no mucosal edema, no airflow obstruction, and mucosal erythema.  R maxillary sinus tender Mouth:  no exudates and pharyngeal erythema.   Lungs:  moist harsh cough, no wheezes.   Cervical Nodes:  no anterior cervical adenopathy and no posterior cervical adenopathy.   Psych:  normally interactive and good eye contact.     Impression & Recommendations:  Problem # 1:  BRONCHITIS-ACUTE (ICD-466.0) continue comfort care measures: increase po fluids, rest, tylenol or IBP as needed will start on Minocin two times a day x 7d see back in 4d if not improved Her updated medication list for this problem includes:    Minocin 100 Mg Caps (Minocycline hcl) .Marland Kitchen... 1 two times a day  Complete Medication List: 1)  Oscal 500/200 D-3 500-200 Mg-unit Tabs (Calcium-vitamin d) .... Take two by mouth daily 2)  Vitamin B-12 Cr 1000 Mcg Tbcr (Cyanocobalamin) .... One injection q 3 months 3)  Altace 5 Mg Caps (Ramipril) .Marland Kitchen.. 1 by  mouth once daily 4)  Pepcid Ac 10 Mg Tabs (Famotidine) .Marland Kitchen.. 1 at bedtime by mouth as needed 5)  Hydrochlorothiazide 25 Mg Tabs (Hydrochlorothiazide) .Marland Kitchen.. 1 by mouth once daily 6)  Vitamin D 2000 Unit Tabs (Cholecalciferol) .... Take 1 tablet by mouth once a day 7)  Aspirin 325 Mg Tabs (Aspirin) .... As needed 8)  Minocin 100 Mg Caps (Minocycline hcl) .Marland Kitchen.. 1 two times a day Prescriptions: MINOCIN 100 MG CAPS (MINOCYCLINE HCL) 1 two times a day  #14 x 0   Entered and Authorized by:   Gildardo Griffes FNP   Signed by:   Gildardo Griffes FNP on 08/09/2008   Method used:   Electronically to        [facility]. [address] [phone]* (retail)       [address] [place], [place]  [id number]       Ph: [phone]       Fax: [phone]   RxID:   [id number]   Current Allergies (reviewed today): ! PREVACID ! * BUSPAR PENICILLIN CODEINE

## 2010-04-10 NOTE — Miscellaneous (Signed)
Summary: Controlled Substance Agreement  Controlled Substance Agreement   Imported By: Lanelle Bal 08/24/2009 11:50:09  _____________________________________________________________________  External Attachment:    Type:   Image     Comment:   External Document

## 2010-04-10 NOTE — Assessment & Plan Note (Signed)
Summary: B-12   Nurse Visit   Allergies: 1)  ! Prevacid 2)  ! * Buspar 3)  ! Minocin (Minocycline Hcl) 4)  Penicillin 5)  Codeine  Medication Administration  Injection # 1:    Medication: Vit B12 1000 mcg    Diagnosis: Hx of VITAMIN B12 DEFICIENCY (ICD-266.2)    Route: IM    Site: R deltoid    Exp Date: 03/11/2011    Lot #: [id number]    Mfr: American Regent    Patient tolerated injection without complications    Given by: Delilah Shan CMA (AAMA) (September 05, 2009 10:27 AM)  Orders Added: 1)  Admin of Therapeutic Inj  intramuscular or subcutaneous [96372] 2)  Vit B12 1000 mcg [J3420]   Medication Administration  Injection # 1:    Medication: Vit B12 1000 mcg    Diagnosis: Hx of VITAMIN B12 DEFICIENCY (ICD-266.2)    Route: IM    Site: R deltoid    Exp Date: 03/11/2011    Lot #: [id number]    Mfr: American Regent    Patient tolerated injection without complications    Given by: Delilah Shan CMA (AAMA) (September 05, 2009 10:27 AM)  Orders Added: 1)  Admin of Therapeutic Inj  intramuscular or subcutaneous [96372] 2)  Vit B12 1000 mcg [J3420]

## 2010-04-10 NOTE — Assessment & Plan Note (Signed)
Summary: ROA 3 MTHS CYD   Vital Signs:  Patient profile:   77 year old female Height:      62.5 inches Weight:      133.25 pounds BMI:     24.07 Temp:     98 degrees F oral Pulse rate:   76 / minute Pulse rhythm:   regular BP sitting:   136 / 72  (left arm) Cuff size:   regular  Vitals Entered By: Lewanda Rife LPN (Aug 01, 2009 9:26 AM) CC: three month f/u   History of Present Illness: here for f/u of HTN and anxiety  husb had aneurysm and it ruptured again  he died 4 weeks ago  in bad shape with grief right now  in good support from neighbors   cries all the time  most of the legal issues are rectified   is re- living this over and over in her mind  very difficult to deal with   son is close by --he is very emotional too   would consider something mild for anxiety/ depression is not sleeping well -- gets most anxious and worried at night      Allergies: 1)  ! Prevacid 2)  ! * Buspar 3)  ! Minocin (Minocycline Hcl) 4)  Penicillin 5)  Codeine  Past History:  Past Medical History: Last updated: 02/19/2008 Allergic rhinitis GERD Hyperlipidemia Osteopenia Osteoporosis Fear of swallowing meds (pills) anxiety HTN   Past Surgical History: Last updated: 01/18/2009 Appendectomy Caesarean section Cholecystectomy Ovarian cyst- surgery Benign breast biopsy Dexa- OP of femoral neck (04/2000) Femoral neck fracture- surgery (07/25/04) Dexa- OP (11/2004) 2D echo 09/10 -mild diastolic dysf carotid dopplers 9/10- nl  CT head 9/10 - calcified meningioma/otherwide nl  hosp 9/10 syncope from dehydration/ and uti breast bx neg 10/10  Family History: Last updated: 09/25/2007 Father: heart problems Mother: HTN Siblings:  sister with macular deg sister OP aunt DM aunt throat ca   Social History: Last updated: 05/03/2009 Marital Status: Married husb with AAA- almost died in 07/25/09 , made recovery Children: 2 Occupation: part time bank teller non smoker    no alcohol   Risk Factors: Smoking Status: quit (09/24/2006)  Review of Systems General:  Complains of fatigue and loss of appetite; denies malaise. Eyes:  Denies blurring and eye irritation. CV:  Denies palpitations, shortness of breath with exertion, and swelling of feet. Resp:  Denies cough and wheezing. GI:  Denies abdominal pain, change in bowel habits, and indigestion. GU:  Denies abnormal vaginal bleeding, discharge, and hematuria. Derm:  Denies lesion(s) and rash. Neuro:  Denies headaches, numbness, tingling, and tremors. Psych:  Complains of anxiety and depression; denies panic attacks, sense of great danger, and suicidal thoughts/plans. Endo:  Denies cold intolerance, excessive thirst, excessive urination, and heat intolerance. Heme:  Denies abnormal bruising and bleeding.  Physical Exam  General:  Well-developed,well-nourished,in no acute distress; alert,appropriate and cooperative throughout examination Head:  normocephalic, atraumatic, and no abnormalities observed.   Eyes:  vision grossly intact, pupils equal, pupils round, and pupils reactive to no conjunctival pallor, injection or icterus  Mouth:  pharynx pink and moist.   Neck:  supple with full rom and no masses or thyromegally, no JVD or carotid bruit  Chest Wall:  No deformities, masses, or tenderness noted. Lungs:  Normal respiratory effort, chest expands symmetrically. Lungs are clear to auscultation, no crackles or wheezes. Heart:  Normal rate and regular rhythm. S1 and S2 normal without gallop, murmur, click, rub or other  extra sounds. Abdomen:  soft and non-tender.  no renal bruits  Msk:  No deformity or scoliosis noted of thoracic or lumbar spine.   Extremities:  No clubbing, cyanosis, edema, or deformity noted with normal full range of motion of all joints.   Neurologic:  gait normal and DTRs symmetrical and normal.  no tremor  Skin:  Intact without suspicious lesions or rashes Cervical Nodes:  No  lymphadenopathy noted Psych:  is tearful and anxious -- speaks freely about loss good eye contact and good insight about her grief and feelings    Impression & Recommendations:  Problem # 1:  GRIEF REACTION (ICD-309.0) Assessment New with anxiety and trouble sleeping fair support at home xanax given as needed at bedtime  ref to counselor disc stressors/symptoms/ coping techniques/ tx options and side eff in detail today 25 min spent face to face time  over 50% of which spent on counseling and coordination of care  f/u 6 wk  Orders: Psychology Referral (Psychology)  Problem # 2:  HYPERTENSION NEC (ICD-997.91) Assessment: Unchanged this is stable -despite worse anx no change in med   Complete Medication List: 1)  Oscal 500/200 D-3 500-200 Mg-unit Tabs (Calcium-vitamin d) .... Take two by mouth daily 2)  Vitamin B-12 Cr 1000 Mcg Tbcr (Cyanocobalamin) .... One injection q 3 months 3)  Altace 5 Mg Caps (Ramipril) .Marland Kitchen.. 1 by mouth once daily 4)  Hydrochlorothiazide 25 Mg Tabs (Hydrochlorothiazide) .Marland Kitchen.. 1 by mouth once daily holding for hypotension 5)  Vitamin D 2000 Unit Tabs (Cholecalciferol) .... Take 1 tablet by mouth once a day 6)  Aspirin 325 Mg Tabs (Aspirin) .... As needed 7)  Flonase 50 Mcg/act Susp (Fluticasone propionate) .... 2 sprays in each nostril once daily as needed 8)  Antivert 25 Mg Tabs (Meclizine hcl) .Marland Kitchen.. 1 by mouth up to three times a day as needed dizziness  (warn- can sedate) 9)  Alprazolam 0.5 Mg Tabs (Alprazolam) .Marland Kitchen.. 1 by mouth at bedtime as needed anxiety  Patient Instructions: 1)  we will do referral for some counseling at check out  2)  try the alprazolam as needed at night- caution- it will sedate/ will also help you sleep 3)  blood pressure is ok today 4)  try to eat regular meals and take care of yourself  5)  follow up with me in 6 weeks  Prescriptions: ALPRAZOLAM 0.5 MG TABS (ALPRAZOLAM) 1 by mouth at bedtime as needed anxiety  #30 x 3   Entered  and Authorized by:   Judith Part MD   Signed by:   Judith Part MD on 08/01/2009   Method used:   Print then Give to Patient   RxID:   [id number]   Current Allergies (reviewed today): ! PREVACID ! * BUSPAR ! MINOCIN (MINOCYCLINE HCL) PENICILLIN CODEINE

## 2010-04-10 NOTE — Assessment & Plan Note (Signed)
Summary: BP concerns per Dr. Fabio Pierce   Vital Signs:  Patient profile:   77 year old female Height:      62.5 inches Weight:      137.25 pounds BMI:     24.79 Temp:     97.9 degrees F oral Pulse rate:   60 / minute Pulse rhythm:   regular BP sitting:   140 / 70  (right arm) Cuff size:   regular  Vitals Entered By: Lewanda Rife LPN (May 03, 2009 9:49 AM)  Serial Vital Signs/Assessments:  Time      Position  BP       Pulse  Resp  Temp     By                     135/65                         Judith Part MD   History of Present Illness: here for some blood pressure concerns   at home has been checking bp and ranged from 150-170/80s several times in a row has come down in past couple of days  150/71 sat - ever since then 140/ 70-80s - overall a lot better   no symptoms from her blood pressure in generall   occ headache is not related  husband had aneurysm in his aorta  and he miraculously recovered  surgery was 21st of jan is not back to normal yet - will take a while  is requiring a lot of help and she has no extra help   he is doing a lot better  no rehab    some stress since husband had surgery -- now stress level is calming down   wants to start walking more  Allergies: 1)  ! Prevacid 2)  ! * Buspar 3)  ! Minocin (Minocycline Hcl) 4)  Penicillin 5)  Codeine  Past History:  Past Medical History: Last updated: 02/19/2008 Allergic rhinitis GERD Hyperlipidemia Osteopenia Osteoporosis Fear of swallowing meds (pills) anxiety HTN   Past Surgical History: Last updated: 01/18/2009 Appendectomy Caesarean section Cholecystectomy Ovarian cyst- surgery Benign breast biopsy Dexa- OP of femoral neck (04/2000) Femoral neck fracture- surgery (2004-08-12) Dexa- OP (11/2004) 2D echo 09/10 -mild diastolic dysf carotid dopplers 9/10- nl  CT head 9/10 - calcified meningioma/otherwide nl  hosp 9/10 syncope from dehydration/ and uti breast bx neg  10/10  Family History: Last updated: 09/25/2007 Father: heart problems Mother: HTN Siblings:  sister with macular deg sister OP aunt DM aunt throat ca   Social History: Last updated: 05/03/2009 Marital Status: Married husb with AAA- almost died in 2009/08/12 , made recovery Children: 2 Occupation: part time bank teller non smoker  no alcohol   Risk Factors: Smoking Status: quit (09/24/2006)  Social History: Marital Status: Married husb with AAA- almost died in 2009-08-12 , made recovery Children: 2 Occupation: part time Haematologist non smoker  no alcohol   Review of Systems General:  Complains of fatigue; denies chills, fever, loss of appetite, and malaise. Eyes:  Denies blurring and eye irritation. CV:  Denies chest pain or discomfort, lightheadness, palpitations, and shortness of breath with exertion. Resp:  Denies cough and wheezing. GI:  Denies abdominal pain, indigestion, and nausea. GU:  Denies hematuria and urinary frequency. MS:  Denies joint pain. Derm:  Denies itching, lesion(s), poor wound healing, and rash. Neuro:  Denies numbness and tingling. Endo:  Denies cold intolerance,  excessive thirst, excessive urination, and heat intolerance. Heme:  Denies abnormal bruising and bleeding.  Physical Exam  General:  Well-developed,well-nourished,in no acute distress; alert,appropriate and cooperative throughout examination Head:  normocephalic, atraumatic, and no abnormalities observed.   Eyes:  vision grossly intact, pupils equal, pupils round, and pupils reactive to light.  no conjunctival pallor, injection or icterus  Mouth:  pharynx pink and moist.   Neck:  supple with full rom and no masses or thyromegally, no JVD or carotid bruit  Chest Wall:  No deformities, masses, or tenderness noted. Lungs:  Normal respiratory effort, chest expands symmetrically. Lungs are clear to auscultation, no crackles or wheezes. Heart:  Normal rate and regular rhythm. S1 and S2 normal  without gallop, murmur, click, rub or other extra sounds. Abdomen:  Bowel sounds positive,abdomen soft and non-tender without masses, organomegaly or hernias noted. no renal bruits  Msk:  No deformity or scoliosis noted of thoracic or lumbar spine.   Pulses:  R and L carotid,radial,femoral,dorsalis pedis and posterior tibial pulses are full and equal bilaterally Extremities:  No clubbing, cyanosis, edema, or deformity noted with normal full range of motion of all joints.   Neurologic:  sensation intact to light touch, gait normal, and DTRs symmetrical and normal.  no tremor  Skin:  Intact without suspicious lesions or rashes Cervical Nodes:  No lymphadenopathy noted Inguinal Nodes:  No significant adenopathy Psych:  slightly anxious but not tearful good eye contact and comm skills     Impression & Recommendations:  Problem # 1:  HYPERTENSION NEC (ICD-997.91) Assessment Deteriorated bp was briefly elevated during extreme stress- but now seems to be improved  will continue to watch (consider new bp cuff as it seems to run high) rev lifestyle change - with low salt diet and exercise update if changes  f/u 3 mo   Problem # 2:  ANXIETY (ICD-300.00) Assessment: Deteriorated this worsened during stress but now is much imp good support in spouse and neighbor and getting back to nl  offered counseling- pt declined suspect bp lability is aff by this - and will try to check it when relaxed enc to call if symptoms worsen again  Complete Medication List: 1)  Oscal 500/200 D-3 500-200 Mg-unit Tabs (Calcium-vitamin d) .... Take two by mouth daily 2)  Vitamin B-12 Cr 1000 Mcg Tbcr (Cyanocobalamin) .... One injection q 3 months 3)  Altace 5 Mg Caps (Ramipril) .Marland Kitchen.. 1 by mouth once daily 4)  Hydrochlorothiazide 25 Mg Tabs (Hydrochlorothiazide) .Marland Kitchen.. 1 by mouth once daily holding for hypotension 5)  Vitamin D 2000 Unit Tabs (Cholecalciferol) .... Take 1 tablet by mouth once a day 6)  Aspirin 325 Mg  Tabs (Aspirin) .... As needed 7)  Flonase 50 Mcg/act Susp (Fluticasone propionate) .... 2 sprays in each nostril once daily as needed 8)  Antivert 25 Mg Tabs (Meclizine hcl) .Marland Kitchen.. 1 by mouth up to three times a day as needed dizziness  (warn- can sedate)  Patient Instructions: 1)  watch salt in diet and get good water intake 2)  start walking regularly indoors and out  3)  keep talking to your neighbor for support  4)  if blood pressure increases again or if symptoms worsen - let me know  5)  follow up with me in about 3 months 6)  shedule nurse visit for B12 shot at end of march please   Current Allergies (reviewed today): ! PREVACID ! * BUSPAR ! MINOCIN (MINOCYCLINE HCL) PENICILLIN CODEINE

## 2010-04-10 NOTE — Assessment & Plan Note (Signed)
Summary: fell-knot on head,filled with fluid,went to ER last sun.   Vital Signs:  Patient Profile:   77 Years Old Female Height:     62 inches Weight:      137 pounds BMI:     25.15 Temp:     97.9 degrees F oral Pulse rate:   68 / minute Pulse rhythm:   regular BP sitting:   150 / 78  (left arm) Cuff size:   regular  Vitals Entered By: [name] (January 20, 2008 9:20 AM)                 Chief Complaint:  f/u er fell hit head.  History of Present Illness: stubbed her toe going into a resturant -- on nov 1st  fell flat on her face (tried to catch herself too- a little soreness in L arm ) sidewalk was very high- and face hit it   had a lot of bleeding from lac of forehead and also nosebleed  went to [facility]  used tape to repair laceration  was recommended to f/u with ENt - but she chose not to (was told she has small nasal fx)   no breathing problems- no congestion (can blow nose with no problem)  has large hematoma in between/ over eyes - and some bruising   no headache or dizziness or blurry vision overall is feeling ok   was given pain pill - did not really need   did do a CT - showed small nasal fx and nothing else     Current Allergies (reviewed today): ! PREVACID PENICILLIN CODEINE  Past Medical History:    Reviewed history from 09/24/2006 and no changes required:       Allergic rhinitis       GERD       Hyperlipidemia       Osteopenia       Osteoporosis       Fear of swallowing meds (pills)  Past Surgical History:    Reviewed history from 09/24/2006 and no changes required:       Appendectomy       Caesarean section       Cholecystectomy       Ovarian cyst- surgery       Benign breast biopsy       Dexa- OP of femoral neck (04/2000)       Femoral neck fracture- surgery (07/2004)       Dexa- OP (11/2004)   Family History:    Reviewed history from 09/25/2007 and no changes required:       Father: heart problems       Mother: HTN  Siblings:        sister with macular deg       sister OP       aunt DM       aunt throat ca          Social History:    Reviewed history from 09/25/2007 and no changes required:       Marital Status: Married       Children: 2       Occupation: part time Haematologist       non smoker        no alcohol     Review of Systems  General      Denies chills, fatigue, fever, and malaise.  Eyes      Denies blurring, discharge, double vision, and eye pain.  ENT  Denies nasal congestion and nosebleeds.  CV      Denies chest pain or discomfort, palpitations, and shortness of breath with exertion.  Resp      Denies cough and wheezing.  GI      Denies nausea and vomiting.  MS      Complains of stiffness.      Denies joint redness and joint swelling.  Derm      Denies itching and rash.  Neuro      Denies difficulty with concentration, disturbances in coordination, headaches, inability to speak, memory loss, numbness, poor balance, seizures, sensation of room spinning, tingling, visual disturbances, and weakness.   Physical Exam  General:     Well-developed,well-nourished,in no acute distress; alert,appropriate and cooperative throughout examination Head:     hematoma on lower forehead above brows- is soft and compressible ecchymosis surrounding both eyes with mild edama underneath bridge of nose tender/ no crepitice healing /clean app laceration on forehead  no sinus or cheek tenderness  Eyes:     vision grossly intact, pupils equal, pupils round, and pupils reactive to light.  no conj pallor or injection Ears:     R ear normal and L ear normal.   Nose:     nares are patent and non- congested with good air flow  Mouth:     pharynx pink and moist, no erythema, and no exudates.   Neck:     nl rom without bony tenderness  Chest Wall:     No deformities, masses, or tenderness noted. Lungs:     Normal respiratory effort, chest expands symmetrically. Lungs are  clear to auscultation, no crackles or wheezes. Heart:     Normal rate and regular rhythm. S1 and S2 normal without gallop, murmur, click, rub or other extra sounds. Msk:     No deformity or scoliosis noted of thoracic or lumbar spine.  nl rom all ext with no acute joint changes  Extremities:     No clubbing, cyanosis, edema, or deformity noted with normal full range of motion of all joints.   Neurologic:     cranial nerves II-XII intact, sensation intact to light touch, gait normal, DTRs symmetrical and normal, and Romberg negative.   Skin:     see head exam - healing laceration facial  Cervical Nodes:     No lymphadenopathy noted Psych:     normal affect, talkative and pleasant     Impression & Recommendations:  Problem # 1:  ACCIDENTAL FALL ON OR FROM SIDEWALK CURB (ICD-E880.1) Assessment: New with facial injuries and hematoma that are healing pt will continue ice and update if inc pain or headache or other neurol change   Problem # 2:  CONTUSION OF FACE SCALP AND NECK EXCEPT EYE (ICD-920) Assessment: New overall - laceration is healing with skin edges well approximated  hematoma is still large but imp recommended ice and will take time to resolve discussed s/s of infection to watch for adv to update if inc pain or swelling or neurol sympt sent for ER records   Problem # 3:  FRACTURE, NOSE (ICD-802.0) Assessment: New with minimal swelling of nasal bridge at this time  pend records/ CT report if fx is displaced or if any nasal congestion- will need f/u with ENT ([facility]) will rev ER reports and update  Complete Medication List: 1)  Oscal 500/200 D-3 500-200 Mg-unit Tabs (Calcium-vitamin d) .... Take two by mouth daily 2)  Vitamin B-12 Cr 1000 Mcg Tbcr (Cyanocobalamin) .... One  injection q 3 months 3)  Altace 2.5 Mg Caps (Ramipril) .... 2 by mouth once daily 4)  Pepcid Ac 10 Mg Tabs (Famotidine) .Marland Kitchen.. 1 at bedtime by mouth   Patient Instructions: 1)  please send for ER  reports from [facility] as well as xrays and CT scan 2)  you can use cold compress on swollen area of forehead  3)  update me if you have headache or blurred vision or dizziness 4)  update me if you get nasal congestion or difficulty breathing through your nose    ]

## 2010-04-10 NOTE — Assessment & Plan Note (Signed)
Summary: Jenna Young b12 shot/rbh  Nurse Visit    Prior Medications: OSCAL 500/200 D-3 500-200 MG-UNIT  TABS (CALCIUM-VITAMIN D) take two by mouth daily VITAMIN B-12 CR 1000 MCG  TBCR (CYANOCOBALAMIN) one injection q 3 months ALTACE 2.5 MG  CAPS (RAMIPRIL) one by mouth daily PREVACID SOLUTAB 30 MG  TBDP (LANSOPRAZOLE) one by mouth tid Current Allergies: PENICILLIN CODEINE    Medication Administration  Injection # 1:    Medication: Vit B12 1000 mcg    Diagnosis: Hx of VITAMIN B12 DEFICIENCY (ICD-266.2)    Route: IM    Site: R deltoid    Exp Date: 01/2009    Lot #: [id number]    Mfr: American Regent    Patient tolerated injection without complications    Given by: Lowella Petties (June 29, 2007 2:33 PM)  Orders Added: 1)  Vit B12 1000 mcg [J3420] 2)  Admin of Therapeutic Inj  intramuscular or subcutaneous [email or web address]    ]

## 2010-04-10 NOTE — Progress Notes (Signed)
Summary: BP concerns  Phone Note Call from Patient   Caller: Patient Call For: Judith Part MD Summary of Call: Blood pressure concerns.  Yesterday 167/83 and 170/80.  Today 150/80, she has walked around the block and feels ok.  Been under a lot of stress lately because her husband just had surgery.  Advised her to just keep a check on it and if she becomes SOB or have palpitations or develop any other symptoms over the weekend to go to the ER.  Call us back and let us know how her BP's are running on Monday.   Initial call taken by: Linde Gillis CMA [facility] Dull),  April 28, 2009 3:03 PM  Follow-up for Phone Call        looks like her bp was a bit high last time here as well go ahead and please just schedule her f/u next week- thanks if symptoms , let me know  per MAT  Left message on voicemail  in detail.  Personalized VM.  Follow-up by: Delilah Shan CMA [facility] Dull),  April 28, 2009 4:45 PM

## 2010-04-10 NOTE — Assessment & Plan Note (Signed)
Summary: B-12 INJ/Winnie Barsky/[facility]   Nurse Visit   Allergies: 1)  ! Prevacid 2)  ! * Buspar 3)  ! Minocin (Minocycline Hcl) 4)  Penicillin 5)  Codeine  Medication Administration  Injection # 1:    Medication: Vit B12 1000 mcg    Diagnosis: Hx of VITAMIN B12 DEFICIENCY (ICD-266.2)    Route: IM    Site: R deltoid    Exp Date: 11/2010    Lot #: [id number]    Mfr: American Regent    Patient tolerated injection without complications    Given by: Lowella Petties CMA (February 28, 2009 10:14 AM)  Orders Added: 1)  Vit B12 1000 mcg [J3420] 2)  Admin of Therapeutic Inj  intramuscular or subcutaneous [[id number]]   Medication Administration  Injection # 1:    Medication: Vit B12 1000 mcg    Diagnosis: Hx of VITAMIN B12 DEFICIENCY (ICD-266.2)    Route: IM    Site: R deltoid    Exp Date: 11/2010    Lot #: [id number]    Mfr: American Regent    Patient tolerated injection without complications    Given by: Lowella Petties CMA (February 28, 2009 10:14 AM)  Orders Added: 1)  Vit B12 1000 mcg [J3420] 2)  Admin of Therapeutic Inj  intramuscular or subcutaneous [[id number]]

## 2010-04-10 NOTE — Assessment & Plan Note (Signed)
Summary: elevated blood pressure/alc   Vital Signs:  Patient profile:   77 year old female Height:      62.5 inches Weight:      133.75 pounds BMI:     24.16 Temp:     98.2 degrees F oral Pulse rate:   80 / minute Pulse rhythm:   regular BP sitting:   140 / 72  (left arm) Cuff size:   regular  Vitals Entered By: Lewanda Rife LPN (October 16, 2009 11:57 AM) CC: Elevated BP   History of Present Illness: here for f/u of bp  in recent past - goes up with grief and anx and then goes back down  bp has been up -- if she gets stressed out  had to take extra altace one day if she has inc bp does get bp in back of head  thought she was doing ok with stress and grief  is living with 43 year old grandchild -- lived with them since age 66  this is becoming more and more stressful  Allergies: 1)  ! Prevacid 2)  ! * Buspar 3)  ! Minocin (Minocycline Hcl) 4)  Penicillin 5)  Codeine  Past History:  Past Medical History: Last updated: 02/19/2008 Allergic rhinitis GERD Hyperlipidemia Osteopenia Osteoporosis Fear of swallowing meds (pills) anxiety HTN   Past Surgical History: Last updated: 01/18/2009 Appendectomy Caesarean section Cholecystectomy Ovarian cyst- surgery Benign breast biopsy Dexa- OP of femoral neck (04/2000) Femoral neck fracture- surgery (31-Jul-2004) Dexa- OP (11/2004) 2D echo 09/10 -mild diastolic dysf carotid dopplers 9/10- nl  CT head 9/10 - calcified meningioma/otherwide nl  hosp 9/10 syncope from dehydration/ and uti breast bx neg 10/10  Family History: Last updated: 09/25/2007 Father: heart problems Mother: HTN Siblings:  sister with macular deg sister OP aunt DM aunt throat ca   Social History: Last updated: 10/16/2009 Marital Status: Married husb with AAA- almost died in 07/31/2009 , made recovery then died  Children: 2 Occupation: part time bank teller non smoker  no alcohol   Risk Factors: Smoking Status: quit (09/24/2006)  Social  History: Marital Status: Married husb with AAA- almost died in Jul 31, 2009 , made recovery then died  Children: 2 Occupation: part time Haematologist non smoker  no alcohol   Review of Systems General:  Complains of fatigue; denies chills, fever, loss of appetite, and malaise. Eyes:  Denies blurring and eye irritation. CV:  Denies chest pain or discomfort and lightheadness. Resp:  Denies cough, shortness of breath, and wheezing. GI:  Denies abdominal pain, change in bowel habits, indigestion, nausea, and vomiting. MS:  Denies muscle aches and cramps. Derm:  Denies poor wound healing and rash. Neuro:  Complains of headaches; denies numbness, tingling, and weakness. Psych:  Complains of anxiety. Endo:  Denies cold intolerance, excessive thirst, excessive urination, and heat intolerance. Heme:  Denies abnormal bruising and bleeding.  Physical Exam  General:  Well-developed,well-nourished,in no acute distress; alert,appropriate and cooperative throughout examination--- seems generally fatigued  Head:  normocephalic, atraumatic, and no abnormalities observed.   Eyes:  vision grossly intact, pupils equal, pupils round, and pupils reactive to light.  no conjunctival pallor, injection or icterus  Mouth:  pharynx pink and moist.   Neck:  supple with full rom and no masses or thyromegally, no JVD or carotid bruit  Chest Wall:  No deformities, masses, or tenderness noted. Lungs:  Normal respiratory effort, chest expands symmetrically. Lungs are clear to auscultation, no crackles or wheezes. Heart:  Normal rate  and regular rhythm. S1 and S2 normal without gallop, murmur, click, rub or other extra sounds. Abdomen:  Bowel sounds positive,abdomen soft and non-tender without masses, organomegaly or hernias noted. no renal bruits  Msk:  No deformity or scoliosis noted of thoracic or lumbar spine.  no acute joint changes  Pulses:  R and L carotid,radial,femoral,dorsalis pedis and posterior tibial pulses are  full and equal bilaterally Extremities:  No clubbing, cyanosis, edema, or deformity noted with normal full range of motion of all joints.   Neurologic:  sensation intact to light touch, gait normal, and DTRs symmetrical and normal.  no tremor  Skin:  Intact without suspicious lesions or rashes Cervical Nodes:  No lymphadenopathy noted Psych:  seems stressed and tired nl eye contact and comm skills    Impression & Recommendations:  Problem # 1:  HYPERTENSION NEC (ICD-997.91) Assessment Deteriorated  this varies with stress but is not staying up more and more inc altace to 10 mg daily  disc low salt diet  enc to keep up walking  update if side eff or hypotension f/u 4-6 wk  Orders: Prescription Created Electronically [phone])  Problem # 2:  ANXIETY (ICD-300.00) Assessment: Deteriorated  has stress issues taking care of grandson this must stop for her own sanity and she will disc this with the child's parents disc this in detail along with her coping skills and stressors/symptoms  did offer counseling - declined at this time  Her updated medication list for this problem includes:    Alprazolam 0.5 Mg Tabs (Alprazolam) .Marland Kitchen... 1 by mouth at bedtime as needed anxiety  Orders: Prescription Created Electronically [phone])  Complete Medication List: 1)  Oscal 500/200 D-3 500-200 Mg-unit Tabs (Calcium-vitamin d) .... Take two by mouth daily 2)  Vitamin B-12 Cr 1000 Mcg Tbcr (Cyanocobalamin) .... One injection every 3 months 3)  Altace 10 Mg Caps (Ramipril) .Marland Kitchen.. 1 by mouth once daily 4)  Hydrochlorothiazide 25 Mg Tabs (Hydrochlorothiazide) .Marland Kitchen.. 1 by mouth once daily holding for hypotension 5)  Vitamin D 2000 Unit Tabs (Cholecalciferol) .... Take 1 tablet by mouth once a day 6)  Aspirin 325 Mg Tabs (Aspirin) .... As needed 7)  Flonase 50 Mcg/act Susp (Fluticasone propionate) .... 2 sprays in each nostril once daily as needed 8)  Antivert 25 Mg Tabs (Meclizine hcl) .Marland Kitchen.. 1 by mouth up to  three times a day as needed dizziness  (warn- can sedate) 9)  Alprazolam 0.5 Mg Tabs (Alprazolam) .Marland Kitchen.. 1 by mouth at bedtime as needed anxiety  Patient Instructions: 1)  increase your blood pressure medicine (altace) from 5 to 10 mg once daily  2)  take 2 of what you have until you fill new px  3)  keep up the walking  4)  think about wearing support hose  5)  if blood pressure is below 90/50 or you feel dizzy- let me know  6)  you need to talk to your son about taking care of the grandchild --- you do not need or deserve the stress from that  7)  follow up with me in about 6-8 weeks  Prescriptions: ALTACE 10 MG CAPS (RAMIPRIL) 1 by mouth once daily  #30 x 11   Entered and Authorized by:   Judith Part MD   Signed by:   Judith Part MD on 10/16/2009   Method used:   Electronically to        [facility]. [facility] [phone]* (retail)  [address] [place], [place]  [id number]       Ph: [phone]       Fax: [phone]   RxID:   [id number]   Current Allergies (reviewed today): ! PREVACID ! * BUSPAR ! MINOCIN (MINOCYCLINE HCL) PENICILLIN CODEINE

## 2010-04-10 NOTE — Progress Notes (Signed)
Summary: refill for ramipril  Phone Note Refill Request Message from:  Fax from Pharmacy  Refills Requested: Medication #1:  ALTACE 2.5 MG  CAPS one by mouth daily [facility], form is on your shelf  Initial call taken by: Lowella Petties,  January 08, 2008 2:13 PM  Follow-up for Phone Call        px printed out for fax   Follow-up by: Judith Part MD,  January 11, 2008 8:07 AM  Additional Follow-up for Phone Call Additional follow up Details #1::        Rx faxed to pharmacy Additional Follow-up by: [name],  January 11, 2008 8:56 AM    New/Updated Medications: ALTACE 2.5 MG  CAPS (RAMIPRIL) one by mouth daily   Prescriptions: ALTACE 2.5 MG  CAPS (RAMIPRIL) one by mouth daily  #90 x 3   Entered and Authorized by:   Judith Part MD   Signed by:   Judith Part MD on 01/11/2008   Method used:   Printed then faxed to ...       [facility]. # [phone]* (retail)       [address] [place], [place]  [address]       Ph: [phone]       Fax: [phone]   RxID:   [id number]

## 2010-04-10 NOTE — Assessment & Plan Note (Signed)
Summary: B-12 INJ/Shineka Auble/CLE   Nurse Visit    Prior Medications: OSCAL 500/200 D-3 500-200 MG-UNIT  TABS (CALCIUM-VITAMIN D) take two by mouth daily VITAMIN B-12 CR 1000 MCG  TBCR (CYANOCOBALAMIN) one injection q 3 months ALTACE 2.5 MG  CAPS (RAMIPRIL) one by mouth daily PREVACID SOLUTAB 30 MG  TBDP (LANSOPRAZOLE) one by mouth tid Current Allergies: PENICILLIN CODEINE    Medication Administration  Injection # 1:    Medication: Vit B12 1000 mcg    Diagnosis: Hx of VITAMIN B12 DEFICIENCY (ICD-266.2)    Route: IM    Site: L deltoid    Exp Date: 08/2008    Lot #: [id number]    Mfr: american regent    Patient tolerated injection without complications    Given by: Lowella Petties (December 31, 2006 2:41 PM)  Orders Added: 1)  Vit B12 1000 mcg [J3420] 2)  Admin of Therapeutic Inj  intramuscular or subcutaneous Quintilian.[email or web address]    ]  Medication Administration  Injection # 1:    Medication: Vit B12 1000 mcg    Diagnosis: Hx of VITAMIN B12 DEFICIENCY (ICD-266.2)    Route: IM    Site: L deltoid    Exp Date: 08/2008    Lot #: [id number]    Mfr: american regent    Patient tolerated injection without complications    Given by: Lowella Petties (December 31, 2006 2:41 PM)  Orders Added: 1)  Vit B12 1000 mcg [J3420] 2)  Admin of Therapeutic Inj  intramuscular or subcutaneous [[id number]]

## 2010-04-10 NOTE — Assessment & Plan Note (Signed)
Summary: nurse visit 9am--b-12 shot/tasha :)   Nurse Visit    Prior Medications: OSCAL 500/200 D-3 500-200 MG-UNIT  TABS (CALCIUM-VITAMIN D) take two by mouth daily VITAMIN B-12 CR 1000 MCG  TBCR (CYANOCOBALAMIN) one injection q 3 months ALTACE 5 MG CAPS (RAMIPRIL) 1 by mouth once daily PEPCID AC 10 MG  TABS (FAMOTIDINE) 1 AT BEDTIME BY MOUTH BUSPAR 15 MG TABS (BUSPIRONE HCL) 1/2 by mouth two times a day HYDROCHLOROTHIAZIDE 25 MG TABS (HYDROCHLOROTHIAZIDE) 1 by mouth once daily Current Allergies: ! PREVACID PENICILLIN CODEINE    Medication Administration  Injection # 1:    Medication: Vit B12 1000 mcg    Diagnosis: Hx of VITAMIN B12 DEFICIENCY (ICD-266.2)    Route: IM    Site: R deltoid    Exp Date: 11/2009    Lot #: [id number]    Mfr: American Regent    Patient tolerated injection without complications    Given by: Burna Mortimer Combs (February 29, 2008 8:41 AM)  Orders Added: 1)  Vit B12 1000 mcg [J3420] 2)  Admin of Therapeutic Inj  intramuscular or subcutaneous Lepidus.Putnam    ]    Medication Administration  Injection # 1:    Medication: Vit B12 1000 mcg    Diagnosis: Hx of VITAMIN B12 DEFICIENCY (ICD-266.2)    Route: IM    Site: R deltoid    Exp Date: 11/2009    Lot #: [id number]    Mfr: American Regent    Patient tolerated injection without complications    Given by: Burna Mortimer Combs (February 29, 2008 8:41 AM)  Orders Added: 1)  Vit B12 1000 mcg [J3420] 2)  Admin of Therapeutic Inj  intramuscular or subcutaneous [[id number]]

## 2010-04-10 NOTE — Assessment & Plan Note (Signed)
Summary: CHECK BLOOD PRESSURE CHECK LEG/MK   Vital Signs:  Patient profile:   77 year old female Height:      62.5 inches Weight:      135.13 pounds BMI:     24.41 Temp:     98 degrees F oral Pulse rate:   80 / minute Pulse rhythm:   regular BP sitting:   132 / 78  (left arm) Cuff size:   regular  Vitals Entered By: Delilah Shan CMA (AAMA) (December 22, 2008 12:14 PM) CC: 1. Check BP.  2.  Check leg.   History of Present Illness: 77 yo pt new to me here for follow up hypertension and new spot on her leg.  Hypertension - was  in hosp from 9/5- 9/7 for syncope/ dehydration/ low na.Cleotis Nipper was held at that time.  Restarted it at 5 mg due to hypertension and resolution of dizziness and she is here today to recheck BP.  Denies any more episodes of dizziness, blurred vision, presyncope, chest pain.    Spot on leg- right lower leg small red spot that was flaky and itchy and has grown circularly outwards.  Continues to itch.  Has not tried anything for it.  no bug bites.  has a dog.  Around children at times.    Current Medications (verified): 1)  Oscal 500/200 D-3 500-200 Mg-Unit  Tabs (Calcium-Vitamin D) .... Take Two By Mouth Daily 2)  Vitamin B-12 Cr 1000 Mcg  Tbcr (Cyanocobalamin) .... One Injection Q 3 Months 3)  Altace 5 Mg Caps (Ramipril) .Marland Kitchen.. 1 By Mouth Once Daily Holding For Hypotension 4)  Pepcid Ac 10 Mg  Tabs (Famotidine) .Marland Kitchen.. 1 At Bedtime By Mouth As Needed 5)  Hydrochlorothiazide 25 Mg Tabs (Hydrochlorothiazide) .Marland Kitchen.. 1 By Mouth Once Daily Holding For Hypotension 6)  Vitamin D 2000 Unit Tabs (Cholecalciferol) .... Take 1 Tablet By Mouth Once A Day 7)  Aspirin 325 Mg  Tabs (Aspirin) .... As Needed 8)  Clotrimazole 1 % Crea (Clotrimazole) .... Apply To Affected Area Bid  Allergies: 1)  ! Prevacid 2)  ! * Buspar 3)  ! Minocin (Minocycline Hcl) 4)  Penicillin 5)  Codeine  Review of Systems      See HPI Eyes:  Denies blurring. Neuro:  Denies seizures, sensation of  room spinning, visual disturbances, and weakness.  Physical Exam  General:  Well-developed,well-nourished,in no acute distress; alert,appropriate and cooperative throughout examination Vs reviewed- BP 132/78 Lungs:  Normal respiratory effort, chest expands symmetrically. Lungs are clear to auscultation, no crackles or wheezes. Heart:  Normal rate and regular rhythm. S1 and S2 normal without gallop, murmur, click, rub or other extra sounds. Skin:  4 cm, circular, scaly raised plaque Psych:  normal affect, talkative and pleasant    Impression & Recommendations:  Problem # 1:  HYPERTENSION NEC (ICD-997.91) Assessment Improved Continue Altace.  F/u with primary doctor if problems persist.  Has her own BP cuff at home.  Problem # 2:  RINGWORM (ICD-110.9) Assessment: New Treat with topical Clotrimazole.  Advised to follow up in a few weeks if lesion worsens.  Complete Medication List: 1)  Oscal 500/200 D-3 500-200 Mg-unit Tabs (Calcium-vitamin d) .... Take two by mouth daily 2)  Vitamin B-12 Cr 1000 Mcg Tbcr (Cyanocobalamin) .... One injection q 3 months 3)  Altace 5 Mg Caps (Ramipril) .Marland Kitchen.. 1 by mouth once daily holding for hypotension 4)  Pepcid Ac 10 Mg Tabs (Famotidine) .Marland Kitchen.. 1 at bedtime by mouth as needed  5)  Hydrochlorothiazide 25 Mg Tabs (Hydrochlorothiazide) .Marland Kitchen.. 1 by mouth once daily holding for hypotension 6)  Vitamin D 2000 Unit Tabs (Cholecalciferol) .... Take 1 tablet by mouth once a day 7)  Aspirin 325 Mg Tabs (Aspirin) .... As needed 8)  Clotrimazole 1 % Crea (Clotrimazole) .... Apply to affected area bid Prescriptions: CLOTRIMAZOLE 1 % CREA (CLOTRIMAZOLE) Apply to affected area bid  #30 g x 0   Entered and Authorized by:   Ruthe Mannan MD   Signed by:   Ruthe Mannan MD on 12/22/2008   Method used:   Electronically to        [facility]. [address] [phone]* (retail)       [address] [place], [place]  [id number]       Ph: [phone]       Fax: [phone]   RxID:    [id number]   Current Allergies (reviewed today): ! PREVACID ! * BUSPAR ! MINOCIN (MINOCYCLINE HCL) PENICILLIN CODEINE

## 2010-04-10 NOTE — Letter (Signed)
Summary: [facility]   Imported By: Maryln Gottron 12/27/2008 12:28:56  _____________________________________________________________________  External Attachment:    Type:   Image     Comment:   External Document

## 2010-04-10 NOTE — Assessment & Plan Note (Signed)
Summary: B12   Nurse Visit    Prior Medications: OSCAL 500/200 D-3 500-200 MG-UNIT  TABS (CALCIUM-VITAMIN D) take two by mouth daily VITAMIN B-12 CR 1000 MCG  TBCR (CYANOCOBALAMIN) one injection q 3 months ALTACE 2.5 MG  CAPS (RAMIPRIL) one by mouth daily PREVACID SOLUTAB 30 MG  TBDP (LANSOPRAZOLE) one by mouth tid Current Allergies: PENICILLIN CODEINE    Medication Administration  Injection # 1:    Medication: Vit B12 1000 mcg    Diagnosis: Hx of VITAMIN B12 DEFICIENCY (ICD-266.2)    Route: IV    Site: R deltoid    Exp Date: 12/2008    Lot #: [id number]    Mfr: American Regent    Patient tolerated injection without complications    Given by: Lowella Petties (March 30, 2007 3:08 PM)  Orders Added: 1)  Vit B12 1000 mcg [J3420] 2)  Admin of Therapeutic Inj  intramuscular or subcutaneous Lepidus.Putnam    ]

## 2010-04-10 NOTE — Assessment & Plan Note (Signed)
Summary: HOSPTIAL FOLLOW UP [facility] DISCHARGED 9/7/[facility]   Vital Signs:  Patient profile:   77 year old female Height:      62.5 inches Weight:      135 pounds BMI:     24.39 Temp:     97.8 degrees F oral Pulse rate:   72 / minute Pulse rhythm:   regular BP sitting:   120 / 60  (left arm) Cuff size:   regular  Vitals Entered By: [name] CMA [facility] Dull) (November 21, 2008 11:34 AM)  History of Present Illness: pt was in hosp from 9/5- 9/7 for syncope/ dehydration/ low na  got really dizzy one day- walking to bathroom in hall -- and lost balance  got her to bedroom and she passed out  thinks she was not drinking enough fluids-- had been to gun show outdoors and got really hot and sunburned   per pt had uti - did not know it  given iv abx for that  2Decho showed mild diastolic - this is new-- no sob / cp or other problems at all  nl carotid dopplers  CT head - showed old calcified meningioma   is currently holding bp meds (altace and hctz) bp is good today off of those   still feels a little weak- no dizziness at all  also has to return for f/u mam - retroalveolar density on L that is more promient     Allergies: 1)  ! Prevacid 2)  ! * Buspar 3)  ! 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 Eyes:  Denies blurring and eye pain. CV:  Denies chest pain or discomfort and palpitations; no PND or orthopnea. Resp:  Denies cough, shortness of breath, and wheezing. GI:  Denies abdominal pain and change in bowel habits. GU:  Denies dysuria, hematuria, and urinary frequency. Derm:  Denies itching, lesion(s), poor wound healing, and rash. Neuro:  Denies numbness and tingling. Psych:  mood is ok . Endo:  Denies excessive thirst and excessive urination. Heme:  Denies abnormal bruising and bleeding.  Physical Exam  General:  Well-developed,well-nourished,in no acute distress; alert,appropriate and cooperative throughout examination Head:  normocephalic, atraumatic, and no abnormalities observed.   Eyes:  vision grossly intact, pupils equal, pupils round, and pupils reactive to light.  no conjunctival pallor, injection or icterus  Mouth:  pharynx pink and moist.   Neck:  supple with full rom and no masses or thyromegally, no JVD or carotid bruit  Chest Wall:  No deformities, masses, or tenderness noted. Lungs:  Normal respiratory effort, chest expands symmetrically. Lungs are clear to auscultation, no crackles or  wheezes. Heart:  Normal rate and regular rhythm. S1 and S2 normal without gallop, murmur, click, rub or other extra sounds. Abdomen:  Bowel sounds positive,abdomen soft and non-tender without masses, organomegaly or hernias noted. no suprapubic tenderness or fullness felt  Msk:  No deformity or scoliosis noted of thoracic or lumbar spine.   Pulses:  R and L carotid,radial,femoral,dorsalis pedis and posterior tibial pulses are full and equal bilaterally  Extremities:  No clubbing, cyanosis, edema, or deformity noted with normal full range of motion of all joints.   Neurologic:  sensation intact to light touch, gait normal, and DTRs symmetrical and normal.   Skin:  Intact without suspicious lesions or rashes no pallor or jaundice Cervical Nodes:  No lymphadenopathy noted Inguinal Nodes:  No significant adenopathy Psych:  normal affect, talkative and pleasant    Impression & Recommendations:  Problem # 1:  SYNCOPE (ICD-780.2) Assessment New f/u post hosp for syncope/ heat exhaustion/dehydration/hyponatremia (was also tx for uti) reviewed hosp notes in detail  is orally re hydrating and feeling better  check renal panel today  continue to hold ace and hctz - since bp is good today suspect we will need to re start those in future  f/u 1 mo   rev hosp records in detail - incl test results there was some diastolic dysf on echo-- will disc further at f/u (may consid cardiol f/u or stress test) pt is entirely asymptomatic     Orders: Venipuncture (09811) TLB-Renal Function Panel (80069-RENAL)  Problem # 2:  HYPERTENSION NEC (ICD-997.91) Assessment: Improved holding meds for hypotension following syncope f/u 1 mo  Orders: Venipuncture (91478) TLB-Renal Function Panel (80069-RENAL)  Problem # 3:  Hx of VITAMIN B12 DEFICIENCY (ICD-266.2) Assessment: Unchanged  B12 shot today (last one 3 mo)  Orders: Admin of Therapeutic Inj  intramuscular or subcutaneous (29562) Vit B12 1000 mcg (J3420)  Problem # 4:  MAMMOGRAM, ABNORMAL, LEFT (ICD-793.80) Assessment: Comment Only for f/u tomorrow   Complete Medication List: 1)  Oscal 500/200 D-3 500-200 Mg-unit Tabs (Calcium-vitamin d) .... Take two by mouth daily 2)  Vitamin B-12 Cr 1000 Mcg Tbcr (Cyanocobalamin) .... One injection q 3 months 3)  Altace 5 Mg Caps (Ramipril) .Marland Kitchen.. 1 by mouth once daily holding for hypotension 4)  Pepcid Ac 10 Mg Tabs (Famotidine) .Marland Kitchen.. 1 at bedtime by  mouth as needed 5)  Hydrochlorothiazide 25 Mg Tabs (Hydrochlorothiazide) .Marland Kitchen.. 1 by mouth once daily holding for hypotension 6)  Vitamin D 2000 Unit Tabs (Cholecalciferol) .... Take 1 tablet by mouth once a day 7)  Aspirin 325 Mg Tabs (Aspirin) .... As needed  Patient Instructions: 1)  B12 shot today 2)  keep up a good fluid intake 3)  continue to hold the altace and the hctz  4)  labs today 5)  update me if you get any urine symptoms  6)  get mammogram tomorrow as planned  7)  follow up with me in 1 month to re check blood pressure   Prior Medications (reviewed today): OSCAL 500/200 D-3 500-200 MG-UNIT  TABS (CALCIUM-VITAMIN D) take two by mouth daily VITAMIN B-12 CR 1000 MCG  TBCR (CYANOCOBALAMIN) one injection q 3 months PEPCID AC 10 MG  TABS (FAMOTIDINE) 1 AT BEDTIME BY MOUTH as needed VITAMIN D 2000 UNIT TABS (CHOLECALCIFEROL) Take 1 tablet by mouth once a day ASPIRIN 325 MG  TABS (ASPIRIN) as needed Current Allergies (reviewed today): ! PREVACID ! * BUSPAR ! MINOCIN (MINOCYCLINE HCL) PENICILLIN CODEINE Current Medications (including changes  made in today's visit):  OSCAL 500/200 D-3 500-200 MG-UNIT  TABS (CALCIUM-VITAMIN D) take two by mouth daily VITAMIN B-12 CR 1000 MCG  TBCR (CYANOCOBALAMIN) one injection q 3 months ALTACE 5 MG CAPS (RAMIPRIL) 1 by mouth once daily HOLDING FOR HYPOTENSION PEPCID AC 10 MG  TABS (FAMOTIDINE) 1 AT BEDTIME BY MOUTH as needed HYDROCHLOROTHIAZIDE 25 MG TABS (HYDROCHLOROTHIAZIDE) 1 by mouth once daily HOLDING FOR HYPOTENSION VITAMIN D 2000 UNIT TABS (CHOLECALCIFEROL) Take 1 tablet by mouth once a day ASPIRIN 325 MG  TABS (ASPIRIN) as needed    Preventive Care Screening  Last Tetanus Booster:    Date:  01/10/2008    Results:  Td    Medication Administration  Injection # 1:    Medication: Vit B12 1000 mcg    Diagnosis: Hx of VITAMIN B12 DEFICIENCY (ICD-266.2)    Route: IM    Site: L deltoid    Exp Date: 07/09/2010    Lot #: [id number]     Mfr: [employer]    Patient tolerated injection without complications    Given by: [name] CMA (AAMA) (November 21, 2008 12:33 PM)  Orders Added: 1)  Venipuncture [50093] 2)  TLB-Renal Function Panel [80069-RENAL] 3)  Admin of Therapeutic Inj  intramuscular or subcutaneous [96372] 4)  Vit B12 1000 mcg [J3420] 5)  Est. Patient Level IV [81829]

## 2010-04-10 NOTE — Progress Notes (Signed)
Summary: BP up today  Phone Note Call from Patient Call back at [facility] Phone [phone] Call back at [phone]   Caller: Patient Call For: Judith Part MD Summary of Call: Pt states her BP has been elevated, 186/75 and 192/73 today. Felt a little light headed today.  She says it has been doing ok but went up today.  She was taken off hctz but still takes the altace.  She says she has been under a lot of stress which might be why it's up.  What should she do. Initial call taken by: Lowella Petties CMA,  August 16, 2009 3:48 PM  Follow-up for Phone Call        follow up when able - I know we have had problems with low bp and fainting in the past so we have to be careful about med adjustments   Follow-up by: Judith Part MD,  August 16, 2009 5:20 PM  Additional Follow-up for Phone Call Additional follow up Details #1::        Patient notified as instructed by telephone. Pt scheduled appt with Dr Milinda Antis 08/18/09 at 10:15am. If pt needs to be seen before will go to [facility] or ER.Lewanda Rife LPN  August 16, 4780 5:30 PM

## 2010-04-10 NOTE — Assessment & Plan Note (Signed)
Summary: F/U BP/CLE   Vital Signs:  Patient Profile:   77 Years Old Female Height:     62 inches Weight:      137 pounds BMI:     25.15 Temp:     98 degrees F oral Pulse rate:   64 / minute Pulse rhythm:   regular BP sitting:   150 / 80  (right arm) Cuff size:   regular  Vitals Entered By: [name] (January 29, 2008 11:33 AM)                 Chief Complaint:  f/u bp.  History of Present Illness: bp has been up since fall increased altace to 5 mg daily from 2.5  no side effects at all  bp at home - started coming down yesterday -- 130s over 70   days before usually 140s systolic  one day was very high 170 ? if up before she fell has been really nervous --ever since she fell ( ? because of her fall)   lots of worries- husband has aneurysm worries about son who drives truck grandson left her care to go back and live with his mom-- all these things upset  since not working - not much support (does not want to burden friends)  walks outside when the weather is ok - with her neighbor  reading helps - gets her away from worrying   had recent fall nasal fracture  breathing fine - no cong or pain  hematoma on forehead is imp-  very little pain -- is gradually coming down in size         Current Allergies (reviewed today): ! PREVACID PENICILLIN CODEINE  Past Medical History:    Allergic rhinitis    GERD    Hyperlipidemia    Osteopenia    Osteoporosis    Fear of swallowing meds (pills)    anxiety  Past Surgical History:    Reviewed history from 09/24/2006 and no changes required:       Appendectomy       Caesarean section       Cholecystectomy       Ovarian cyst- surgery       Benign breast biopsy       Dexa- OP of femoral neck (04/2000)       Femoral neck fracture- surgery (07/2004)       Dexa- OP (11/2004)   Family History:    Reviewed history from 09/25/2007 and no changes required:       Father: heart problems       Mother: HTN   Siblings:        sister with macular deg       sister OP       aunt DM       aunt throat ca          Social History:    Reviewed history from 09/25/2007 and no changes required:       Marital Status: Married       Children: 2       Occupation: part time Haematologist       non smoker        no alcohol     Review of Systems  General      Denies loss of appetite, malaise, sweats, and weight loss.  Eyes      Denies blurring, eye pain, and light sensitivity.  ENT      Denies nasal congestion, postnasal  drainage, sinus pressure, and sore throat.  CV      Denies chest pain or discomfort, palpitations, and shortness of breath with exertion.  Resp      Denies cough, pleuritic, and wheezing.  GI      Denies abdominal pain and change in bowel habits.  Derm      Denies itching, lesion(s), and rash.  Neuro      Denies numbness, tingling, tremors, and weakness.  Psych      Complains of anxiety.      Denies sense of great danger and suicidal thoughts/plans.  Endo      Denies excessive thirst and excessive urination.  Heme      Denies abnormal bruising and bleeding.   Physical Exam  General:     Well-developed,well-nourished,in no acute distress; alert,appropriate and cooperative throughout examination Head:     normocephalic, atraumatic, and no abnormalities observed.  no sinus tenderness  hematoma in central forehead is decreasing in size and color is normalizing  Eyes:     vision grossly intact, pupils equal, pupils round, and pupils reactive to light.  EOM- I Ears:     R ear normal and L ear normal.   Nose:     nares are patent with good air flow  slt tenderness over bridge of nose with improved swelling/ecchymosis Mouth:     pharynx pink and moist, no erythema, and no exudates.   Neck:     supple with full rom and no masses or thyromegally, no JVD or carotid bruit  Lungs:     Normal respiratory effort, chest expands symmetrically. Lungs are clear to  auscultation, no crackles or wheezes. Heart:     Normal rate and regular rhythm. S1 and S2 normal without gallop, murmur, click, rub or other extra sounds. Abdomen:     Bowel sounds positive,abdomen soft and non-tender without masses, organomegaly or hernias noted. no renal bruits  Msk:     No deformity or scoliosis noted of thoracic or lumbar spine.   Pulses:     R and L carotid,radial,femoral,dorsalis pedis and posterior tibial pulses are full and equal bilaterally Extremities:     No clubbing, cyanosis, edema, or deformity noted with normal full range of motion of all joints.   Neurologic:     sensation intact to light touch, gait normal, and DTRs symmetrical and normal.  no tremor  Skin:     Intact without suspicious lesions or rashes Cervical Nodes:     No lymphadenopathy noted Psych:     anxious appearing - but talkative with good communication skills     Impression & Recommendations:  Problem # 1:  FRACTURE, NOSE (ICD-802.0) Assessment: Improved clinically improved with no breathing problems at all  will continue to monitor  adv to continue cold compresses if helpful  Problem # 2:  CONTUSION OF FACE SCALP AND NECK EXCEPT EYE (ICD-920) Assessment: Improved well healed large hematoma on forehead is gradually dec in size adv to continue ice as needed   Problem # 3:  HYPERTENSION NEC (ICD-997.91) bp is up - poss multifactorial (stress)  will continue 5 of altace and add 25 hctz- will carefully monitor at home adv to update if dizzy or low bp or leg cramps enc to start back with exercise  f/u 2-3 weeks for visit and labs   Problem # 4:  ANXIETY (ICD-300.00) Assessment: Deteriorated worse lately after fall and with stress disc coping tech - pt not interested in counseling adv to talk  to friends and family  start buspar- rev poss side eff/ warnings  will update if side eff f/u 2-3 wk Her updated medication list for this problem includes:    Buspar 15 Mg Tabs  (Buspirone hcl) .Marland Kitchen... 1/2 by mouth two times a day   Complete Medication List: 1)  Oscal 500/200 D-3 500-200 Mg-unit Tabs (Calcium-vitamin d) .... Take two by mouth daily 2)  Vitamin B-12 Cr 1000 Mcg Tbcr (Cyanocobalamin) .... One injection q 3 months 3)  Altace 5 Mg Caps (Ramipril) .Marland Kitchen.. 1 by mouth once daily 4)  Pepcid Ac 10 Mg Tabs (Famotidine) .Marland Kitchen.. 1 at bedtime by mouth 5)  Buspar 15 Mg Tabs (Buspirone hcl) .... 1/2 by mouth two times a day 6)  Hydrochlorothiazide 25 Mg Tabs (Hydrochlorothiazide) .Marland Kitchen.. 1 by mouth once daily   Patient Instructions: 1)  start buspar 1/2 pill twice daily (if you have any side effects or feel more anxious or depressed-- stop it and let me know) 2)  continue altace at 5 mg daily (new px)  3)  add hctz 25 mg 1 pill each am with your other medicines  4)  if you are dizzy or have bp under 90s/ 50s - please update me  5)  get back walking regularly 6)  talk to friends or family about stress  7)  follow up in 2-3 weeks for blood pressure visit and labs   Prescriptions: HYDROCHLOROTHIAZIDE 25 MG TABS (HYDROCHLOROTHIAZIDE) 1 by mouth once daily  #30 x 5   Entered and Authorized by:   Judith Part MD   Signed by:   Judith Part MD on 01/29/2008   Method used:   Print then Give to Patient   RxID:   [id number] BUSPAR 15 MG TABS (BUSPIRONE HCL) 1/2 by mouth two times a day  #30 x 11   Entered and Authorized by:   Judith Part MD   Signed by:   Judith Part MD on 01/29/2008   Method used:   Print then Give to Patient   RxID:   [id number] ALTACE 5 MG CAPS (RAMIPRIL) 1 by mouth once daily  #30 x 11   Entered and Authorized by:   Judith Part MD   Signed by:   Judith Part MD on 01/29/2008   Method used:   Print then Give to Patient   RxID:   [id number]  ]

## 2010-04-10 NOTE — Progress Notes (Signed)
Summary: bp update  Phone Note Call from Patient Call back at Home Phone [phone]   Caller: Patient Call For: Sophya Vanblarcom Summary of Call: this am at waking up bp was 159/74  @ 9:25 156/72  just a few minutes ago bp was 134/65  pt took her altace around 4:30 pm yesterday Initial call taken by: [name],  January 22, 2008 10:29 AM  Follow-up for Phone Call        I want her to increase altace to 2 of the 2.5 mg pills once daily if any dizziness or if bp below 100/50 please update me  will put change on EMR f/u with me late next week  Follow-up by: Judith Part MD,  January 22, 2008 1:33 PM  Additional Follow-up for Phone Call Additional follow up Details #1::        Advised patient.  She just got mail order altace in mail so will wait on rx for a ittle while......................................................Marland KitchenLiane Comber January 22, 2008 3:16 PM     New/Updated Medications: ALTACE 2.5 MG  CAPS (RAMIPRIL) 2 by mouth once daily   Prescriptions: ALTACE 2.5 MG  CAPS (RAMIPRIL) 2 by mouth once daily  #60 x 1   Entered by:   [name]   Authorized by:   Judith Part MD   Signed by:   [name] on 01/22/2008   Method used:   Telephoned to ...       [facility]. # [phone]* (retail)       [address] [place], [place]  [address]       Ph: [phone]       Fax: [phone]   RxID:   [id number]

## 2010-04-10 NOTE — Assessment & Plan Note (Signed)
Summary: BP ELEVATED PER DR Ayesha Markwell/RI   Vital Signs:  Patient profile:   77 year old female Height:      62.5 inches Weight:      132.50 pounds BMI:     23.93 Temp:     98.3 degrees F oral Pulse rate:   76 / minute Pulse rhythm:   regular BP sitting:   134 / 72  (left arm) Cuff size:   regular  Vitals Entered By: Lewanda Rife LPN (August 18, 2009 10:34 AM)  Serial Vital Signs/Assessments:  Time      Position  BP       Pulse  Resp  Temp     By                     122/70                         Judith Part MD  CC: BP was elevated   History of Present Illness: called several days ago -- and bp went way up  now is back down to normal again   was 145/71 this am --much better  then 124/ 65 later in am    thinks it went up due to being upset and grief  got some help from her son  still having hard time with grief  she talked with hospice counselor -- and will had a good experience- that was helpful  will be entering a grief program   syncope in past was from low bp -- hesitant to inc med     Allergies: 1)  ! Prevacid 2)  ! * Buspar 3)  ! Minocin (Minocycline Hcl) 4)  Penicillin 5)  Codeine  Past History:  Past Medical History: Last updated: 02/19/2008 Allergic rhinitis GERD Hyperlipidemia Osteopenia Osteoporosis Fear of swallowing meds (pills) anxiety HTN   Past Surgical History: Last updated: 01/18/2009 Appendectomy Caesarean section Cholecystectomy Ovarian cyst- surgery Benign breast biopsy Dexa- OP of femoral neck (04/2000) Femoral neck fracture- surgery (Aug 01, 2004) Dexa- OP (11/2004) 2D echo 09/10 -mild diastolic dysf carotid dopplers 9/10- nl  CT head 9/10 - calcified meningioma/otherwide nl  hosp 9/10 syncope from dehydration/ and uti breast bx neg 10/10  Family History: Last updated: 09/25/2007 Father: heart problems Mother: HTN Siblings:  sister with macular deg sister OP aunt DM aunt throat ca   Social History: Last updated:  05/03/2009 Marital Status: Married husb with AAA- almost died in 2009/08/01 , made recovery Children: 2 Occupation: part time bank teller non smoker  no alcohol   Risk Factors: Smoking Status: quit (09/24/2006)  Review of Systems General:  Complains of fatigue; denies fever, loss of appetite, and malaise. Eyes:  Denies blurring, double vision, and eye irritation. CV:  Denies chest pain or discomfort, palpitations, shortness of breath with exertion, and swelling of feet. Resp:  Denies cough, shortness of breath, and wheezing. GI:  Denies abdominal pain, indigestion, and nausea. MS:  Denies muscle aches and stiffness. Derm:  Denies itching, lesion(s), poor wound healing, and rash. Neuro:  Denies numbness and tingling. Endo:  Denies cold intolerance, excessive thirst, excessive urination, and heat intolerance. Heme:  Denies abnormal bruising and bleeding.  Physical Exam  General:  Well-developed,well-nourished,in no acute distress; alert,appropriate and cooperative throughout examination Head:  normocephalic, atraumatic, and no abnormalities observed.   Eyes:  vision grossly intact, pupils equal, pupils round, and pupils reactive to light.  no conjunctival pallor, injection or icterus  Mouth:  pharynx pink and moist.   Neck:  supple with full rom and no masses or thyromegally, no JVD or carotid bruit  Lungs:  Normal respiratory effort, chest expands symmetrically. Lungs are clear to auscultation, no crackles or wheezes. Heart:  Normal rate and regular rhythm. S1 and S2 normal without gallop, murmur, click, rub or other extra sounds. Msk:  No deformity or scoliosis noted of thoracic or lumbar spine.   Pulses:  R and L carotid,radial,femoral,dorsalis pedis and posterior tibial pulses are full and equal bilaterally Extremities:  No clubbing, cyanosis, edema, or deformity noted with normal full range of motion of all joints.   Neurologic:  sensation intact to light touch, gait normal, and DTRs  symmetrical and normal.  no tremor  Skin:  Intact without suspicious lesions or rashes Cervical Nodes:  No lymphadenopathy noted Psych:  occ tearful and anxious  good eye contact and communication skills   Impression & Recommendations:  Problem # 1:  GRIEF REACTION (ICD-309.0) Assessment Unchanged overall grief causing some acute episodes of anxiety -- but pt is doing better with counseling does not want addn med will begin grief support group in fall - feel this will help a lot  disc link between this and elevated bp in detail   Problem # 2:  HYPERTENSION NEC (ICD-997.91) Assessment: Unchanged bp tends to go up with anx and come back down second check at rest today was 122/70-- do not want to inc med in addn - past hx of syncope with hypotension made agreement that for bp above 160/90- can take extra altace and update me  disc anx coping tech  continue counseling   Complete Medication List: 1)  Oscal 500/200 D-3 500-200 Mg-unit Tabs (Calcium-vitamin d) .... Take two by mouth daily 2)  Vitamin B-12 Cr 1000 Mcg Tbcr (Cyanocobalamin) .... One injection every 3 months 3)  Altace 5 Mg Caps (Ramipril) .Marland Kitchen.. 1 by mouth once daily 4)  Hydrochlorothiazide 25 Mg Tabs (Hydrochlorothiazide) .Marland Kitchen.. 1 by mouth once daily holding for hypotension 5)  Vitamin D 2000 Unit Tabs (Cholecalciferol) .... Take 1 tablet by mouth once a day 6)  Aspirin 325 Mg Tabs (Aspirin) .... As needed 7)  Flonase 50 Mcg/act Susp (Fluticasone propionate) .... 2 sprays in each nostril once daily as needed 8)  Antivert 25 Mg Tabs (Meclizine hcl) .Marland Kitchen.. 1 by mouth up to three times a day as needed dizziness  (warn- can sedate) 9)  Alprazolam 0.5 Mg Tabs (Alprazolam) .Marland Kitchen.. 1 by mouth at bedtime as needed anxiety  Patient Instructions: 1)  if you have a day when your blood pressure goes up above 160/90- please take an extra altace (ramipril)  2)  update me if it stays up  3)  continue counseling  4)  let me know if you need  more help   Current Allergies (reviewed today): ! PREVACID ! * BUSPAR ! MINOCIN (MINOCYCLINE HCL) PENICILLIN CODEINE

## 2010-04-10 NOTE — Progress Notes (Signed)
Summary: BP has been elevated  Phone Note Call from Patient Call back at Home Phone [phone]   Caller: Patient Call For: Judith Part MD Summary of Call: Pt thinks her blood pressure is up, she says it was 155/66 yesterday. 185/79 today.  She has been feeling dizzy.  She asks if she should go back on her medicine.  She has an appt with you on 10/14. Initial call taken by: Lowella Petties CMA,  December 09, 2008 11:20 AM  Follow-up for Phone Call        yes- start back on altace (stop it if dizzy or bp under 90/50) will re check at f/u Follow-up by: Judith Part MD,  December 09, 2008 11:49 AM  Additional Follow-up for Phone Call Additional follow up Details #1::        patient is aware Additional Follow-up by: Kern Reap CMA Duncan Dull),  December 09, 2008 12:33 PM

## 2010-04-10 NOTE — Letter (Signed)
Summary: [facility]   Imported By: Maryln Gottron 01/18/2009 11:33:45  _____________________________________________________________________  External Attachment:    Type:   Image     Comment:   External Document  Appended Document: [facility]     Clinical Lists Changes  Observations: Added new observation of PAST SURG HX: Appendectomy Caesarean section Cholecystectomy Ovarian cyst- surgery Benign breast biopsy Dexa- OP of femoral neck (04/2000) Femoral neck fracture- surgery (07/2004) Dexa- OP (11/2004) 2D echo 09/10 -mild diastolic dysf carotid dopplers 9/10- nl  CT head 9/10 - calcified meningioma/otherwide nl  hosp 9/10 syncope from dehydration/ and uti breast bx neg 10/10    (01/18/2009 22:11)       Past Surgical History:    Appendectomy    Caesarean section    Cholecystectomy    Ovarian cyst- surgery    Benign breast biopsy    Dexa- OP of femoral neck (04/2000)    Femoral neck fracture- surgery (07/2004)    Dexa- OP (11/2004)    2D echo 09/10 -mild diastolic dysf    carotid dopplers 9/10- nl     CT head 9/10 - calcified meningioma/otherwide nl     hosp 9/10 syncope from dehydration/ and uti    breast bx neg 10/10

## 2010-04-10 NOTE — Progress Notes (Signed)
Summary: BP up today  Phone Note Call from Patient   Caller: Patient Call For: Jenna Part MD Reason for Call: Talk to Nurse Summary of Call: Pt states her BP has been up and down since yesterday.  Up to 188/73 today.  She says she feels ok., no dizziness or light headedness.  She has not taken her altace yet today, takes this in the evening.  Please advise. Initial call taken by: Jenna Young,  January 21, 2008 3:12 PM  Follow-up for Phone Call        it was high in the office yesterday - suspected from stress of fall go ahead and take altace now instead of waiting until tonight update me if headache/dizziness- as reviewed before  if bp is not imp later tonight or in the am- I will increase her altace dose -- so call and update me in the am    Follow-up by: Jenna Part MD,  January 21, 2008 4:19 PM  Additional Follow-up for Phone Call Additional follow up Details #1::        Advised patient.  ......................................................Marland KitchenLiane Comber January 21, 2008 5:12 PM

## 2010-04-10 NOTE — Miscellaneous (Signed)
Summary: altace  Medications Added ALTACE 5 MG CAPS (RAMIPRIL) 1 by mouth once daily       Clinical Lists Changes  Medications: Changed medication from ALTACE 5 MG CAPS (RAMIPRIL) 1 by mouth once daily HOLDING FOR HYPOTENSION to ALTACE 5 MG CAPS (RAMIPRIL) 1 by mouth once daily - Signed Rx of ALTACE 5 MG CAPS (RAMIPRIL) 1 by mouth once daily;  #30 x 6;  Signed;  Entered by: Lowella Petties CMA;  Authorized by: Judith Part MD;  Method used: Electronically to [facility]. [facility] [id number]*, [address], [place], [place]  [address], Ph: [phone], Fax: [phone]    Prescriptions: ALTACE 5 MG CAPS (RAMIPRIL) 1 by mouth once daily  #30 x 6   Entered by:   Lowella Petties CMA   Authorized by:   Judith Part MD   Signed by:   Lowella Petties CMA on 02/13/2009   Method used:   Electronically to        [facility]. [address] [id number]* (retail)       [address] [place], [place]  [id number]       Ph: [phone]       Fax: [phone]   RxID:   [id number]   Prior Medications: OSCAL 500/200 D-3 500-200 MG-UNIT  TABS (CALCIUM-VITAMIN D) take two by mouth daily VITAMIN B-12 CR 1000 MCG  TBCR (CYANOCOBALAMIN) one injection q 3 months ALTACE 5 MG CAPS (RAMIPRIL) 1 by mouth once daily PEPCID AC 10 MG  TABS (FAMOTIDINE) 1 AT BEDTIME BY MOUTH as needed HYDROCHLOROTHIAZIDE 25 MG TABS (HYDROCHLOROTHIAZIDE) 1 by mouth once daily HOLDING FOR HYPOTENSION VITAMIN D 2000 UNIT TABS (CHOLECALCIFEROL) Take 1 tablet by mouth once a day ASPIRIN 325 MG  TABS (ASPIRIN) as needed FLONASE 50 MCG/ACT SUSP (FLUTICASONE PROPIONATE) 2 sprays in each nostril once daily ANTIVERT 25 MG TABS (MECLIZINE HCL) 1 by mouth up to three times a day as needed dizziness  (warn- can sedate) Current Allergies: ! PREVACID ! * BUSPAR ! MINOCIN (MINOCYCLINE HCL) PENICILLIN CODEINE

## 2010-04-10 NOTE — Assessment & Plan Note (Signed)
Summary: roa/6 month f/u/Dr. Milinda Antis   Vital Signs:  Patient profile:   77 year old female Height:      62.5 inches Weight:      134 pounds BMI:     24.21 Temp:     98.2 degrees F oral Pulse rate:   80 / minute Pulse rhythm:   regular BP sitting:   140 / 68  (left arm) Cuff size:   regular  Vitals Entered By: [name] (August 19, 2008 11:14 AM)  Serial Vital Signs/Assessments:  Time      Position  BP       Pulse  Resp  Temp     By                     135/70                         Judith Part MD   History of Present Illness: is here for f/u of HTN/ OP and lipids is feeling ok  now-- was sick with bronchitis  is starting to get better  is still coughing - dry -- not productive  did run fever - better now  could not tolerate minocycline -- made her too woozy and diarrhea   bp is up a bit on first check today with systolic 140 is on altace and hctz   lipids fair in jan with trig 94/ good HDL of 63 and LDL 129 diet is overall good is eating healthy diet overall - is trying to avoid fatty foods  was walking every day -- good exercise   wt is stable - BMI of 24.2  checked vit D level in light of bone loss  was 30 - so adv inc D to 2000 international units daily  due for re check  last dexa 06?    Allergies: 1)  ! Prevacid 2)  ! * Buspar 3)  ! Minocin (Minocycline Hcl) 4)  Penicillin 5)  Codeine  Past History:  Past Medical History: Last updated: 02/19/2008 Allergic rhinitis GERD Hyperlipidemia Osteopenia Osteoporosis Fear of swallowing meds (pills) anxiety HTN   Past Surgical History: Last updated: 09/24/2006 Appendectomy Caesarean section Cholecystectomy Ovarian cyst- surgery Benign breast biopsy Dexa- OP of femoral neck (04/2000) Femoral neck fracture- surgery (07/2004) Dexa- OP (11/2004)  Family History: Last updated: 09/25/2007 Father: heart problems Mother: HTN Siblings:  sister with macular deg sister OP aunt DM aunt throat  ca   Social History: Last updated: 09/25/2007 Marital Status: Married Children: 2 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 Eyes:  Denies blurring and eye irritation. ENT:  Complains of postnasal drainage. CV:  Denies chest pain or discomfort, lightheadness, palpitations, and shortness of breath with exertion. Resp:  Complains of cough; denies pleuritic, shortness of breath, sputum productive, and wheezing. GI:  Denies abdominal pain and change in bowel habits. MS:  Denies muscle aches, stiffness, and thoracic pain. Derm:  Denies poor wound healing and rash. Endo:  Denies excessive thirst and excessive urination.  Physical Exam  General:  Well-developed,well-nourished,in no acute distress; alert,appropriate and cooperative throughout examination Head:  normocephalic, atraumatic, and no abnormalities observed.  no sinus tenderness  Eyes:  vision grossly intact, pupils equal, pupils round, and pupils reactive to light.   Ears:  R ear normal and  L ear normal.   Nose:  no nasal discharge.   Mouth:  pharynx pink and moist.   Neck:  supple with full rom and no masses or thyromegally, no JVD or carotid bruit  Lungs:  CTA , no rales/rhonchi or wheeze  harsh cough occasionally Heart:  Normal rate and regular rhythm. S1 and S2 normal without gallop, murmur, click, rub or other extra sounds. Abdomen:  soft and non-tender.  no renal bruits  Msk:  No deformity or scoliosis noted of thoracic or lumbar spine.   Pulses:  R and L carotid,radial,femoral,dorsalis pedis and posterior tibial pulses are full and equal bilaterally Extremities:  No clubbing, cyanosis, edema, or deformity noted with normal full range of motion of all joints.   Neurologic:  sensation intact to light touch, gait normal, and DTRs symmetrical and normal.   Skin:  Intact without  suspicious lesions or rashes Cervical Nodes:  No lymphadenopathy noted Psych:  normal affect, talkative and pleasant    Impression & Recommendations:  Problem # 1:  BRONCHITIS-ACUTE (ICD-466.0) Assessment Improved improving - after stopping abx due to intolerance  will continue to monitor- keep up fluid intake pt advised to update me if symptoms worsen or do not improve - esp cough or any fever  The following medications were removed from the medication list:    Minocin 100 Mg Caps (Minocycline hcl) .Marland Kitchen... 1 two times a day  Problem # 2:  UNSPECIFIED VITAMIN D DEFICIENCY (ICD-268.9) Assessment: Improved mild - with last level of 30 checking today on inc dose of 2000 international units daily  disc ca intake  dexa ordered  Orders: Venipuncture (84132) T-Vitamin D (25-Hydroxy) (44010-27253)  Problem # 3:  OSTEOPOROSIS (ICD-733.00) Assessment: Comment Only  is overdue for dexa / last 2006 ca and vit D disc schedule dexa  Her updated medication list for this problem includes:    Oscal 500/200 D-3 500-200 Mg-unit Tabs (Calcium-vitamin d) .Marland Kitchen... Take two by mouth daily    Vitamin D 2000 Unit Tabs (Cholecalciferol) .Marland Kitchen... Take 1 tablet by mouth once a day  Orders: Venipuncture (66440) T-Vitamin D (25-Hydroxy) (34742-59563) Radiology Referral (Radiology)  Problem # 4:  HYPERLIPIDEMIA (ICD-272.4) Assessment: Unchanged  fair control with diet - rev labs today want to keep LDL below 130 good HDL  adv to continue watching sat fats in diet   Labs Reviewed: SGOT: 17 (03/24/2008)   SGPT: 14 (03/24/2008)   HDL:63.6 (03/24/2008), 48.2 (09/25/2007)  LDL:DEL (03/24/2008), DEL (09/25/2007)  Chol:224 (03/24/2008), 217 (09/25/2007)  Trig:94 (03/24/2008), 144 (09/25/2007)  Problem # 5:  HYPERTENSION NEC (ICD-997.91) Assessment: Unchanged better on second check -- overall fairly well controlled  135/60 second check  continue avoiding sodium in diet   Complete Medication List: 1)   Oscal 500/200 D-3 500-200 Mg-unit Tabs (Calcium-vitamin d) .... Take two by mouth daily 2)  Vitamin B-12 Cr 1000 Mcg Tbcr (Cyanocobalamin) .... One injection q 3 months 3)  Altace 5 Mg Caps (Ramipril) .Marland Kitchen.. 1 by mouth once daily 4)  Pepcid Ac 10 Mg Tabs (Famotidine) .Marland Kitchen.. 1 at bedtime by mouth as needed 5)  Hydrochlorothiazide 25 Mg Tabs (Hydrochlorothiazide) .Marland Kitchen.. 1 by mouth once daily 6)  Vitamin D 2000 Unit Tabs (Cholecalciferol) .... Take 1 tablet by mouth once a day 7)  Aspirin 325 Mg Tabs (Aspirin) .... As needed  Other Orders: Admin of Therapeutic Inj  intramuscular or subcutaneous (87564) Vit B12 1000 mcg (P3295)   Patient Instructions: 1)  update me if cough gets worse or fever  or other symptoms  2)  update me if bronchitis is not improving in 1-2 weeks  3)  no change in medication  4)  B12 shot today  5)  labs today for vit D- will update you  6)  we will schedule you for bone density test at check out  7)  (check your insurance coverage before you go) 8)  keep watching saturated fats in diet   Prior Medications (reviewed today): OSCAL 500/200 D-3 500-200 MG-UNIT  TABS (CALCIUM-VITAMIN D) take two by mouth daily VITAMIN B-12 CR 1000 MCG  TBCR (CYANOCOBALAMIN) one injection q 3 months ALTACE 5 MG CAPS (RAMIPRIL) 1 by mouth once daily PEPCID AC 10 MG  TABS (FAMOTIDINE) 1 AT BEDTIME BY MOUTH as needed HYDROCHLOROTHIAZIDE 25 MG TABS (HYDROCHLOROTHIAZIDE) 1 by mouth once daily VITAMIN D 2000 UNIT TABS (CHOLECALCIFEROL) Take 1 tablet by mouth once a day ASPIRIN 325 MG  TABS (ASPIRIN) as needed Current Allergies (reviewed today): ! PREVACID ! * BUSPAR ! MINOCIN (MINOCYCLINE HCL) PENICILLIN CODEINE Current Medications (including changes made in today's visit):  OSCAL 500/200 D-3 500-200 MG-UNIT  TABS (CALCIUM-VITAMIN D) take two by mouth daily VITAMIN B-12 CR 1000 MCG  TBCR (CYANOCOBALAMIN) one injection q 3 months ALTACE 5 MG CAPS (RAMIPRIL) 1 by mouth once daily PEPCID AC 10  MG  TABS (FAMOTIDINE) 1 AT BEDTIME BY MOUTH as needed HYDROCHLOROTHIAZIDE 25 MG TABS (HYDROCHLOROTHIAZIDE) 1 by mouth once daily VITAMIN D 2000 UNIT TABS (CHOLECALCIFEROL) Take 1 tablet by mouth once a day ASPIRIN 325 MG  TABS (ASPIRIN) as needed     Medication Administration  Injection # 1:    Medication: Vit B12 1000 mcg    Diagnosis: Hx of VITAMIN B12 DEFICIENCY (ICD-266.2)    Route: IM    Site: R deltoid    Exp Date: 03/10/2010    Lot #: [id number]    Mfr: [employer] Regent    Patient tolerated injection without complications    Given by: [name] (August 19, 2008 11:43 AM)  Orders Added: 1)  Venipuncture [96045] 2)  T-Vitamin D (25-Hydroxy) [40981-19147] 3)  Admin of Therapeutic Inj  intramuscular or subcutaneous [96372] 4)  Vit B12 1000 mcg [J3420] 5)  Radiology Referral [Radiology] 6)  Est. Patient Level IV [82956]

## 2010-04-10 NOTE — Assessment & Plan Note (Signed)
Summary: DIZZY/CLE   Vital Signs:  Patient profile:   77 year old female Weight:      137 pounds Temp:     98.2 degrees F oral Pulse rate:   84 / minute Pulse rhythm:   regular BP sitting:   160 / 70  (left arm) Cuff size:   regular  Vitals Entered By: Lowella Petties CMA (February 01, 2009 3:05 PM)  Serial Vital Signs/Assessments:  Time      Position  BP       Pulse  Resp  Temp     By                     132/71                         Judith Part MD  CC: Dizzy x one month   History of Present Illness: thinks she may have vertigo  is having intermittent dizziness where she feels like she is spinning - has to quickly sit down and shut her eyes  worse if she changes position / looks down or looks up quickly  happens 0-2 times depending on the day  started back in september - after her fall   no headaches  , no sinus pain  a lot of congestion and cannot breathe well through her nose  no fever  sometimes some blood when she blows her nose -- no colored mucous   no other neurol symptoms no weakness or numbness/ no memory change   Allergies: 1)  ! Prevacid 2)  ! * Buspar 3)  ! Minocin (Minocycline Hcl) 4)  Penicillin 5)  Codeine  Past History:  Past Medical History: Last updated: 02/19/2008 Allergic rhinitis GERD Hyperlipidemia Osteopenia Osteoporosis Fear of swallowing meds (pills) anxiety HTN   Past Surgical History: Last updated: 01/18/2009 Appendectomy Caesarean section Cholecystectomy Ovarian cyst- surgery Benign breast biopsy Dexa- OP of femoral neck (04/2000) Femoral neck fracture- surgery (07/2004) Dexa- OP (11/2004) 2D echo 09/10 -mild diastolic dysf carotid dopplers 9/10- nl  CT head 9/10 - calcified meningioma/otherwide nl  hosp 9/10 syncope from dehydration/ and uti breast bx neg 10/10  Family History: Last updated: 09/25/2007 Father: heart problems Mother: HTN Siblings:  sister with macular deg sister OP aunt DM aunt throat  ca   Social History: Last updated: 09/25/2007 Marital Status: Married Children: 2 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 Eyes:  Denies blurring, double vision, and eye irritation. ENT:  Complains of earache, nasal congestion, postnasal drainage, and sinus pressure; denies ear discharge and sore throat. CV:  Denies chest pain or discomfort and palpitations. Resp:  Denies cough, shortness of breath, and wheezing. GI:  Denies nausea and vomiting. Derm:  Denies itching, lesion(s), poor wound healing, and rash. Neuro:  Denies brief paralysis, falling down, headaches, inability to speak, memory loss, numbness, tingling, visual disturbances, and weakness. Psych:  mood is ok . Endo:  Denies cold intolerance and heat intolerance.  Physical Exam  General:  Well-developed,well-nourished,in no acute distress; alert,appropriate and cooperative throughout examination Head:  normocephalic, atraumatic, and no abnormalities observed.   Eyes:  vision grossly intact, pupils equal, pupils round, and pupils reactive to light.  several beats of horizontal nystagmus noted worse to L  Ears:  TMs are dull with some scarring  Nose:  nares are mildly congested and injected  Mouth:  pharynx pink and moist, no erythema, and no exudates.   Neck:  supple with full rom and no masses or thyromegally, no JVD or carotid bruit  Chest Wall:  No deformities, masses, or tenderness noted. Lungs:  Normal respiratory effort, chest expands symmetrically. Lungs are clear to auscultation, no crackles or wheezes. Heart:  Normal rate and regular rhythm. S1 and S2 normal without gallop, murmur, click, rub or other extra sounds. Msk:  No deformity or scoliosis noted of thoracic or lumbar spine.   Extremities:  No clubbing, cyanosis, edema, or deformity noted with normal full  range of motion of all joints.   Neurologic:  alert & oriented X3, cranial nerves II-XII intact, strength normal in all extremities, sensation intact to light touch, gait normal, DTRs symmetrical and normal, finger-to-nose normal, toes down bilaterally on Babinski, and Romberg negative.   Skin:  Intact without suspicious lesions or rashes Cervical Nodes:  No lymphadenopathy noted Psych:  normal affect, talkative and pleasant    Impression & Recommendations:  Problem # 1:  VERTIGO (ICD-780.4) Assessment New positional and intermittent with sinus congestion and etd  will tx with antivert acutely/ being cautious of sedation  flonase ns for congestion and etd (pt mentions this worked for her in the past)  update if worse or if no imp in a week Her updated medication list for this problem includes:    Antivert 25 Mg Tabs (Meclizine hcl) .Marland Kitchen... 1 by mouth up to three times a day as needed dizziness  (warn- can sedate)  Complete Medication List: 1)  Oscal 500/200 D-3 500-200 Mg-unit Tabs (Calcium-vitamin d) .... Take two by mouth daily 2)  Vitamin B-12 Cr 1000 Mcg Tbcr (Cyanocobalamin) .... One injection q 3 months 3)  Altace 5 Mg Caps (Ramipril) .Marland Kitchen.. 1 by mouth once daily holding for hypotension 4)  Pepcid Ac 10 Mg Tabs (Famotidine) .Marland Kitchen.. 1 at bedtime by mouth as needed 5)  Hydrochlorothiazide 25 Mg Tabs (Hydrochlorothiazide) .Marland Kitchen.. 1 by mouth once daily holding for hypotension 6)  Vitamin D 2000 Unit Tabs (Cholecalciferol) .... Take 1 tablet by mouth once a day 7)  Aspirin 325 Mg Tabs (Aspirin) .... As needed 8)  Flonase 50 Mcg/act Susp (Fluticasone propionate) .... 2 sprays in each nostril once daily 9)  Antivert 25 Mg Tabs (Meclizine hcl) .Marland Kitchen.. 1 by mouth up to three times a day as needed dizziness  (warn- can sedate)  Patient Instructions: 1)  use flonase every day as directed - this will help with congestion which should help your ears 2)  take the meclizine as needed 3)  update me if worse  or if not improved in 2 weeks  Prescriptions: ANTIVERT 25 MG TABS (MECLIZINE HCL) 1 by mouth up to three times a day as needed dizziness  (warn- can sedate)  #30 x 1   Entered and Authorized by:   Judith Part MD   Signed by:   Judith Part MD on 02/01/2009   Method used:   Print then Give to Patient   RxID:   [id number] FLONASE 50 MCG/ACT SUSP (FLUTICASONE PROPIONATE) 2 sprays in each nostril once daily  #1 mdi x 11   Entered and Authorized by:   Judith Part MD   Signed by:   Judith Part MD on 02/01/2009   Method used:   Print then Give to Patient   RxID:   [id number]   Prior Medications (reviewed  today): OSCAL 500/200 D-3 500-200 MG-UNIT  TABS (CALCIUM-VITAMIN D) take two by mouth daily VITAMIN B-12 CR 1000 MCG  TBCR (CYANOCOBALAMIN) one injection q 3 months ALTACE 5 MG CAPS (RAMIPRIL) 1 by mouth once daily HOLDING FOR HYPOTENSION PEPCID AC 10 MG  TABS (FAMOTIDINE) 1 AT BEDTIME BY MOUTH as needed HYDROCHLOROTHIAZIDE 25 MG TABS (HYDROCHLOROTHIAZIDE) 1 by mouth once daily HOLDING FOR HYPOTENSION VITAMIN D 2000 UNIT TABS (CHOLECALCIFEROL) Take 1 tablet by mouth once a day ASPIRIN 325 MG  TABS (ASPIRIN) as needed FLONASE 50 MCG/ACT SUSP (FLUTICASONE PROPIONATE) 2 sprays in each nostril once daily ANTIVERT 25 MG TABS (MECLIZINE HCL) 1 by mouth up to three times a day as needed dizziness  (warn- can sedate) Current Allergies: ! PREVACID ! * BUSPAR ! MINOCIN (MINOCYCLINE HCL) PENICILLIN CODEINE

## 2010-04-12 NOTE — Assessment & Plan Note (Signed)
Summary: B12 INJECTION/ TOWER   Nurse Visit   Vital Signs:  Patient profile:   77 year old female Temp:     98.3 degrees F Pulse rate:   80 / minute Pulse rhythm:   regular BP sitting:   132 / 68  (left arm) Cuff size:   regular CC: Pt is here for B12 injection. Comments Pt states BP has been running high and she does not have h/a, dizziness, chest pain or trouble breathing. Pt states she feels out of focus. Pt scheduled to see Dr Milinda Antis 04/09/10 at 12:30pm. Pt will bring BP log with her   Allergies: 1)  ! Prevacid 2)  ! * Buspar 3)  ! Minocin (Minocycline Hcl) 4)  Penicillin 5)  Codeine  Medication Administration  Injection # 1:    Medication: Vit B12 1000 mcg    Diagnosis: Hx of VITAMIN B12 DEFICIENCY (ICD-266.2)    Route: IM    Site: L deltoid    Exp Date: 12/10/2011    Lot #: [id number]    Mfr: [employer] Regent    Patient tolerated injection without complications    Given by: Lewanda Rife LPN (April 05, 2010 1:35 PM)  Orders Added: 1)  Vit B12 1000 mcg [J3420] 2)  Admin of Therapeutic Inj  intramuscular or subcutaneous [78295]

## 2010-04-12 NOTE — Progress Notes (Signed)
Summary: BP is up today  Phone Note Call from Patient Call back at [phone]   Caller: Patient Summary of Call: Pt had felt woozy and weak this morning so she took her BP and it was 176/72.  She says it has been running ok.  Took some alka seltzer plus last week, but none since.  She did take her medicine this morning, says she didnt sleep well last night, thinks that might be contributing to it being up.  She states she feels ok now but is concerned about BP.  Please advise. Initial call taken by: Lowella Petties CMA, AAMA,  March 22, 2010 9:45 AM  Follow-up for Phone Call        ensure no HA, CP/tightness, vision changes, SOB, etc.  encourage to get plenty of water and rest throughout day.  to check bp later today and tomorrow.  if staying high and still concerned, could come in for visit/rechekc.   Follow-up by: Eustaquio Boyden  MD,  March 22, 2010 10:18 AM  Additional Follow-up for Phone Call Additional follow up Details #1::        Message left for patient to return my call. Kim Dance CMA Duncan Dull)  March 22, 2010 10:45 AM   Spoke with patient and she denied any HA, SOB, Chest pain/tightness or vision changes. I advised her to get plenty of fluids and rest today and keep a check on her BP today and tomorrow. If it's still high and has her concerned, I told her to call for an appt. Instructed her to go to the ER with any of the above symptoms. She verbalized understanding. Additional Follow-up by: Janee Morn CMA Duncan Dull),  March 22, 2010 11:39 AM

## 2010-04-18 NOTE — Assessment & Plan Note (Signed)
Summary: HIGH BP PER [facility]   Vital Signs:  Patient profile:   77 year old female Height:      62.5 inches Weight:      135.75 pounds BMI:     24.52 Temp:     98 degrees F oral Pulse rate:   76 / minute Pulse rhythm:   regular BP sitting:   148 / 72  (left arm) Cuff size:   regular  Vitals Entered By: Lewanda Rife LPN (April 09, 2010 12:50 PM) CC: high BP Comments Pt brought her home BP cuff to office and BP on home cuff was 140/73.   History of Present Illness: here for inc bps   was up last week realized she was on alka selzer plus and held that  bp ok at nurse visit last week  her bp machine is accurate- just checked it   at home 136-176/ 60-80s  feels weird when it goes up -- kind of spacey and out of focus   does not eat like she should - does not watch sodium tries to drink more water  no swelling   walks only when it is warm   still in grief / loss of husband very lonely has a lot of support and goes out with friends a lot   Allergies: 1)  ! Prevacid 2)  ! * Buspar 3)  ! Minocin (Minocycline Hcl) 4)  ! * Hctz 5)  Penicillin 6)  Codeine  Past History:  Past Medical History: Last updated: 02/19/2008 Allergic rhinitis GERD Hyperlipidemia Osteopenia Osteoporosis Fear of swallowing meds (pills) anxiety HTN   Past Surgical History: Last updated: 01/18/2009 Appendectomy Caesarean section Cholecystectomy Ovarian cyst- surgery Benign breast biopsy Dexa- OP of femoral neck (04/2000) Femoral neck fracture- surgery (Aug 12, 2004) Dexa- OP (11/2004) 2D echo 09/10 -mild diastolic dysf carotid dopplers 9/10- nl  CT head 9/10 - calcified meningioma/otherwide nl  hosp 9/10 syncope from dehydration/ and uti breast bx neg 10/10  Family History: Last updated: 09/25/2007 Father: heart problems Mother: HTN Siblings:  sister with macular deg sister OP aunt DM aunt throat ca   Social History: Last updated: 10/16/2009 Marital Status: Married husb  with AAA- almost died in 08/12/09 , made recovery then died  Children: 2 Occupation: part time bank teller non smoker  no alcohol   Risk Factors: Smoking Status: quit (09/24/2006)  Review of Systems General:  Denies fatigue, fever, loss of appetite, and malaise. Eyes:  Denies blurring and eye irritation. ENT:  Denies sinus pressure and sore throat. CV:  Denies chest pain or discomfort, lightheadness, palpitations, and shortness of breath with exertion. Resp:  Denies cough, shortness of breath, and wheezing. GI:  Denies abdominal pain, change in bowel habits, indigestion, nausea, and vomiting. GU:  Denies dysuria and urinary frequency. Derm:  Denies itching and rash. Neuro:  Complains of weakness; denies headaches and tingling. Psych:  still in grief . Endo:  Denies cold intolerance, excessive thirst, excessive urination, and heat intolerance. Heme:  Denies abnormal bruising and bleeding.  Physical Exam  General:  Well-developed,well-nourished,in no acute distress; alert,appropriate and cooperative throughout examination Head:  normocephalic, atraumatic, and no abnormalities observed.   Eyes:  vision grossly intact, pupils equal, pupils round, and pupils reactive to light.   Mouth:  pharynx pink and moist.   Neck:  supple with full rom and no masses or thyromegally, no JVD or carotid bruit  Lungs:  Normal respiratory effort, chest expands symmetrically. Lungs are clear to auscultation, no crackles or  wheezes. Heart:  Normal rate and regular rhythm. S1 and S2 normal without gallop, murmur, click, rub or other extra sounds. Abdomen:  no renal bruits  Pulses:  R and L carotid,radial,femoral,dorsalis pedis and posterior tibial pulses are full and equal bilaterally Extremities:  No clubbing, cyanosis, edema, or deformity noted with normal full range of motion of all joints.   Neurologic:  sensation intact to light touch, gait normal, and DTRs symmetrical and normal.   Skin:  Intact without  suspicious lesions or rashes Cervical Nodes:  No lymphadenopathy noted Psych:  tearful at times when disc grief overall pleasant and talkative    Impression & Recommendations:  Problem # 1:  HYPERTENSION NEC (ICD-997.91) Assessment Deteriorated  this is worse over time - poss from a variety of factors incl age/ lack of exercise/ ? high sodium diet/ grief and stress will add norvasc hct in past may have been linked to dehydration and syncope  ?--unsure -- so will avoid diuretic for now  pt advised to update me if side eff or problems f/u 2 wk  Orders: Prescription Created Electronically [id number])  Complete Medication List: 1)  Oscal 500/200 D-3 500-200 Mg-unit Tabs (Calcium-vitamin d) .... Take two by mouth daily 2)  Vitamin B-12 Cr 1000 Mcg Tbcr (Cyanocobalamin) .... One injection every 3 months 3)  Altace 10 Mg Caps (Ramipril) .Marland Kitchen.. 1 by mouth once daily 4)  Vitamin D 2000 Unit Tabs (Cholecalciferol) .... Take 1 tablet by mouth once a day 5)  Aspirin 325 Mg Tabs (Aspirin) .... As needed 6)  Flonase 50 Mcg/act Susp (Fluticasone propionate) .... 2 sprays in each nostril once daily as needed 7)  Antivert 25 Mg Tabs (Meclizine hcl) .Marland Kitchen.. 1 by mouth up to three times a day as needed dizziness  (warn- can sedate) 8)  Alprazolam 0.5 Mg Tabs (Alprazolam) .Marland Kitchen.. 1 by mouth at bedtime as needed anxiety 9)  Norvasc 5 Mg Tabs (Amlodipine besylate) .Marland Kitchen.. 1 by mouth once daily  Patient Instructions: 1)  continue the altace  2)  add norvasc (amlodipine) 5 mg once daily in am  3)  if any side effects - stop it and let me know  4)  try to get exercise and drink enough water  5)  follow up in 2 weeks  Prescriptions: NORVASC 5 MG TABS (AMLODIPINE BESYLATE) 1 by mouth once daily  #30 x 11   Entered and Authorized by:   Judith Part MD   Signed by:   Judith Part MD on 04/09/2010   Method used:   Electronically to        [facility]. [address] [phone]* (retail)       [address] [place], [place]  [id number]       Ph: [phone]       Fax: [phone]   RxID:   [id number]    Orders Added: 1)  Prescription Created Electronically [[id number]] 2)  Est. Patient Level III [36644]    Current Allergies (reviewed today): ! PREVACID ! * BUSPAR ! MINOCIN (MINOCYCLINE HCL) ! * HCTZ PENICILLIN CODEINE

## 2010-04-23 ENCOUNTER — Other Ambulatory Visit: Payer: Self-pay | Admitting: Family Medicine

## 2010-04-23 ENCOUNTER — Encounter: Payer: Self-pay | Admitting: Family Medicine

## 2010-04-23 ENCOUNTER — Ambulatory Visit (INDEPENDENT_AMBULATORY_CARE_PROVIDER_SITE_OTHER): Payer: Commercial Managed Care - PPO | Admitting: Family Medicine

## 2010-04-23 DIAGNOSIS — E559 Vitamin D deficiency, unspecified: Secondary | ICD-10-CM

## 2010-04-23 DIAGNOSIS — E538 Deficiency of other specified B group vitamins: Secondary | ICD-10-CM

## 2010-04-23 DIAGNOSIS — IMO0002 Reserved for concepts with insufficient information to code with codable children: Secondary | ICD-10-CM

## 2010-04-23 DIAGNOSIS — E785 Hyperlipidemia, unspecified: Secondary | ICD-10-CM

## 2010-04-23 LAB — LIPID PANEL
Cholesterol: 209 mg/dL — ABNORMAL HIGH (ref 0–200)
HDL: 65.1 mg/dL (ref >39.00)
Total CHOL/HDL Ratio: 3
Triglycerides: 106 mg/dL (ref 0.0–149.0)
VLDL: 21.2 mg/dL (ref 0.0–40.0)

## 2010-04-23 LAB — CBC WITH DIFFERENTIAL/PLATELET
Basophils Absolute: 0 10*3/uL (ref 0.0–0.1)
Basophils Relative: 0.1 % (ref 0.0–3.0)
Eosinophils Absolute: 0.1 10*3/uL (ref 0.0–0.7)
Eosinophils Relative: 1.3 % (ref 0.0–5.0)
HCT: 33.9 % — ABNORMAL LOW (ref 36.0–46.0)
Hemoglobin: 11.4 g/dL — ABNORMAL LOW (ref 12.0–15.0)
Lymphocytes Relative: 36.1 % (ref 12.0–46.0)
Lymphs Abs: 2.5 10*3/uL (ref 0.7–4.0)
MCHC: 33.6 g/dL (ref 30.0–36.0)
MCV: 82.9 fl (ref 78.0–100.0)
Monocytes Absolute: 0.5 10*3/uL (ref 0.1–1.0)
Monocytes Relative: 7.1 % (ref 3.0–12.0)
Neutro Abs: 3.9 10*3/uL (ref 1.4–7.7)
Neutrophils Relative %: 55.4 % (ref 43.0–77.0)
Platelets: 231 10*3/uL (ref 150.0–400.0)
RBC: 4.09 Mil/uL (ref 3.87–5.11)
RDW: 16.4 % — ABNORMAL HIGH (ref 11.5–14.6)
WBC: 6.9 10*3/uL (ref 4.5–10.5)

## 2010-04-23 LAB — TSH: TSH: 1.53 u[IU]/mL (ref 0.35–5.50)

## 2010-04-23 LAB — HEPATIC FUNCTION PANEL
ALT: 12 U/L (ref 0–35)
AST: 16 U/L (ref 0–37)
Albumin: 4.1 g/dL (ref 3.5–5.2)
Alkaline Phosphatase: 67 U/L (ref 39–117)
Bilirubin, Direct: 0.1 mg/dL (ref 0.0–0.3)
Total Bilirubin: 0.5 mg/dL (ref 0.3–1.2)
Total Protein: 7.8 g/dL (ref 6.0–8.3)

## 2010-04-23 LAB — RENAL FUNCTION PANEL
Albumin: 4.1 g/dL (ref 3.5–5.2)
BUN: 14 mg/dL (ref 6–23)
CO2: 26 mEq/L (ref 19–32)
Calcium: 9.2 mg/dL (ref 8.4–10.5)
Chloride: 99 mEq/L (ref 96–112)
Creatinine, Ser: 0.8 mg/dL (ref 0.4–1.2)
GFR: 73.01 mL/min (ref >60.00)
Glucose, Bld: 89 mg/dL (ref 70–99)
Phosphorus: 3.9 mg/dL (ref 2.3–4.6)
Potassium: 4.4 mEq/L (ref 3.5–5.1)
Sodium: 138 mEq/L (ref 135–145)

## 2010-04-23 LAB — VITAMIN B12: Vitamin B-12: 335 pg/mL (ref 211–911)

## 2010-04-23 LAB — LDL CHOLESTEROL, DIRECT: Direct LDL: 117.1 mg/dL

## 2010-04-27 LAB — CONVERTED CEMR LAB: Vit D, 25-Hydroxy: 44 ng/mL (ref 30–89)

## 2010-05-02 NOTE — Assessment & Plan Note (Signed)
Summary: 2wk Follow Up / LFW   Vital Signs:  Patient profile:   77 year old female Height:      62.5 inches Weight:      134.75 pounds BMI:     24.34 Temp:     98 degrees F oral Pulse rate:   80 / minute Pulse rhythm:   regular BP sitting:   124 / 66  (left arm) Cuff size:   regular  Vitals Entered By: Lewanda Rife LPN (April 23, 2010 11:54 AM) CC: 2 wk f/u   History of Present Illness: here for f/u of HTN   124/66 on norvasc - much better  feels a bit more tired with the med but wants to give it some more time  overall feels better   at home check of bp is better lowest 117 systolic 130/63 this am   wt is down 1 lb with bmi of 24  b12 def - utd inj in jan   due for vit D check   due for chol check- on low sat fat diet       Allergies: 1)  ! Prevacid 2)  ! * Buspar 3)  ! Minocin (Minocycline Hcl) 4)  ! * Hctz 5)  Penicillin 6)  Codeine  Past History:  Past Medical History: Last updated: 02/19/2008 Allergic rhinitis GERD Hyperlipidemia Osteopenia Osteoporosis Fear of swallowing meds (pills) anxiety HTN   Past Surgical History: Last updated: 01/18/2009 Appendectomy Caesarean section Cholecystectomy Ovarian cyst- surgery Benign breast biopsy Dexa- OP of femoral neck (04/2000) Femoral neck fracture- surgery (2004/08/11) Dexa- OP (11/2004) 2D echo 09/10 -mild diastolic dysf carotid dopplers 9/10- nl  CT head 9/10 - calcified meningioma/otherwide nl  hosp 9/10 syncope from dehydration/ and uti breast bx neg 10/10  Family History: Last updated: 09/25/2007 Father: heart problems Mother: HTN Siblings:  sister with macular deg sister OP aunt DM aunt throat ca   Social History: Last updated: 10/16/2009 Marital Status: Married husb with AAA- almost died in 11-Aug-2009 , made recovery then died  Children: 2 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 Eyes:  Denies blurring and eye irritation. CV:  Denies chest pain or discomfort and fatigue. Resp:  Denies cough, shortness of breath, and wheezing. GI:  Denies abdominal pain, change in bowel habits, indigestion, and nausea. GU:  Denies urinary frequency. MS:  Denies muscle aches and cramps. Derm:  Denies itching, lesion(s), poor wound healing, and rash. Neuro:  Denies numbness and tingling. Endo:  Denies cold intolerance, excessive thirst, excessive urination, and heat intolerance. Heme:  Denies abnormal bruising and bleeding.  Physical Exam  General:  Well-developed,well-nourished,in no acute distress; alert,appropriate and cooperative throughout examination Head:  normocephalic, atraumatic, and no abnormalities observed.   Eyes:  vision grossly intact, pupils equal, pupils round, and pupils reactive to light.   Mouth:  pharynx pink and moist.   Neck:  supple with full rom and no masses or thyromegally, no JVD or carotid bruit  Chest Wall:  No deformities, masses, or tenderness noted. Lungs:  Normal respiratory effort, chest expands symmetrically. Lungs are clear to auscultation, no crackles or wheezes. Heart:  Normal rate and regular rhythm. S1 and S2 normal without gallop, murmur, click, rub or other extra sounds. Msk:  No deformity or scoliosis noted of thoracic or lumbar spine.  no acute joint changes  Extremities:  No clubbing, cyanosis, edema, or deformity noted with normal full range of motion of all joints.   Neurologic:  sensation intact to light touch, gait normal, and DTRs symmetrical and normal.   Skin:  Intact without suspicious lesions or rashes Cervical Nodes:  No lymphadenopathy noted Inguinal Nodes:  No significant adenopathy Psych:  normal affect, talkative and pleasant    Impression & Recommendations:  Problem # 1:  HYPERTENSION NEC (ICD-997.91) Assessment Improved overall  much imp with norvasc she tolerates with a little fatigue will udpdate if any other problems  enc exercise and low sod diet  f/u 6 mo  lab today Orders: Venipuncture (16109) TLB-Lipid Panel (80061-LIPID) TLB-Renal Function Panel (80069-RENAL) TLB-CBC Platelet - w/Differential (85025-CBCD) TLB-Hepatic/Liver Function Pnl (80076-HEPATIC) TLB-TSH (Thyroid Stimulating Hormone) (84443-TSH) TLB-B12, Serum-Total ONLY (60454-U98) T-Vitamin D (25-Hydroxy) (11914-78295)  Problem # 2:  UNSPECIFIED VITAMIN D DEFICIENCY (ICD-268.9) Assessment: Unchanged lab today and adv rev otc dose  Orders: Venipuncture (62130) TLB-Lipid Panel (80061-LIPID) TLB-Renal Function Panel (80069-RENAL) TLB-CBC Platelet - w/Differential (85025-CBCD) TLB-Hepatic/Liver Function Pnl (80076-HEPATIC) TLB-TSH (Thyroid Stimulating Hormone) (84443-TSH) TLB-B12, Serum-Total ONLY (86578-I69) T-Vitamin D (25-Hydroxy) (62952-84132)  Problem # 3:  Hx of VITAMIN B12 DEFICIENCY (ICD-266.2) Assessment: Unchanged continues Q 3 mo shots check level today Orders: Venipuncture (44010) TLB-Lipid Panel (80061-LIPID) TLB-Renal Function Panel (80069-RENAL) TLB-CBC Platelet - w/Differential (85025-CBCD) TLB-Hepatic/Liver Function Pnl (80076-HEPATIC) TLB-TSH (Thyroid Stimulating Hormone) (84443-TSH) TLB-B12, Serum-Total ONLY (27253-G64) T-Vitamin D (25-Hydroxy) (40347-42595)  Problem # 4:  HYPERLIPIDEMIA (ICD-272.4) Assessment: Unchanged  controlling with diet rev low sat fat  lab today and adv f/u 6 mo  Orders: Venipuncture (63875) TLB-Lipid Panel (80061-LIPID) TLB-Renal Function Panel (80069-RENAL) TLB-CBC Platelet - w/Differential (85025-CBCD) TLB-Hepatic/Liver Function Pnl (80076-HEPATIC) TLB-TSH (Thyroid Stimulating Hormone) (84443-TSH) TLB-B12, Serum-Total ONLY (64332-R51) T-Vitamin D (25-Hydroxy) [phone])  Labs Reviewed: SGOT: 17 (03/24/2008)   SGPT: 14 (03/24/2008)   HDL:63.6 (03/24/2008), 48.2  (09/25/2007)  LDL:DEL (03/24/2008), DEL (09/25/2007)  Chol:224 (03/24/2008), 217 (09/25/2007)  Trig:94 (03/24/2008), 144 (09/25/2007)  Complete Medication List: 1)  Oscal 500/200 D-3 500-200 Mg-unit Tabs (Calcium-vitamin d) .... Take two by mouth daily 2)  Vitamin B-12 Cr 1000 Mcg Tbcr (Cyanocobalamin) .... One injection every 3 months 3)  Altace 10 Mg Caps (Ramipril) .Marland Kitchen.. 1 by mouth once daily 4)  Vitamin D 2000 Unit Tabs (Cholecalciferol) .... Take 1 tablet by mouth once a day 5)  Aspirin 325 Mg Tabs (Aspirin) .... As needed 6)  Flonase 50 Mcg/act Susp (Fluticasone propionate) .... 2 sprays in each nostril once daily as needed 7)  Antivert 25 Mg Tabs (Meclizine hcl) .Marland Kitchen.. 1 by mouth up to three times a day as needed dizziness  (warn- can sedate) 8)  Alprazolam 0.5 Mg Tabs (Alprazolam) .Marland Kitchen.. 1 by mouth at bedtime as needed anxiety 9)  Norvasc 5 Mg Tabs (Amlodipine besylate) .Marland Kitchen.. 1 by mouth once daily  Patient Instructions: 1)  continue same medicines  2)  labs today  3)  blood pressure is better 4)  follow up with [facility] in 6 months 5)      Orders Added: 1)  Venipuncture [36415] 2)  TLB-Lipid Panel [80061-LIPID] 3)  TLB-Renal Function Panel [80069-RENAL] 4)  TLB-CBC Platelet - w/Differential [85025-CBCD] 5)  TLB-Hepatic/Liver Function Pnl [80076-HEPATIC] 6)  TLB-TSH (Thyroid Stimulating Hormone) [84443-TSH] 7)  TLB-B12, Serum-Total ONLY [82607-B12] 8)  T-Vitamin D (25-Hydroxy) [16010-93235] 9)  Est. Patient Level IV [57322]    Current Allergies (reviewed today): ! PREVACID ! * BUSPAR ! MINOCIN (MINOCYCLINE HCL) ! * HCTZ PENICILLIN CODEINE

## 2010-06-15 LAB — GLUCOSE, CAPILLARY: Glucose-Capillary: 113 mg/dL — ABNORMAL HIGH (ref 70–99)

## 2010-06-15 LAB — POCT I-STAT, CHEM 8
BUN: 13 mg/dL (ref 6–23)
Calcium, Ion: 1.13 mmol/L (ref 1.12–1.32)
Chloride: 94 mEq/L — ABNORMAL LOW (ref 96–112)
Creatinine, Ser: 0.9 mg/dL (ref 0.4–1.2)
Glucose, Bld: 118 mg/dL — ABNORMAL HIGH (ref 70–99)
HCT: 37 % (ref 36.0–46.0)
Hemoglobin: 12.6 g/dL (ref 12.0–15.0)
Potassium: 4 mEq/L (ref 3.5–5.1)
Sodium: 131 mEq/L — ABNORMAL LOW (ref 135–145)
TCO2: 27 mmol/L (ref 0–100)

## 2010-06-15 LAB — COMPREHENSIVE METABOLIC PANEL
ALT: 12 U/L (ref 0–35)
ALT: 16 U/L (ref 0–35)
AST: 17 U/L (ref 0–37)
AST: 25 U/L (ref 0–37)
Albumin: 3.5 g/dL (ref 3.5–5.2)
Albumin: 3.6 g/dL (ref 3.5–5.2)
Alkaline Phosphatase: 53 U/L (ref 39–117)
Alkaline Phosphatase: 53 U/L (ref 39–117)
BUN: 6 mg/dL (ref 6–23)
BUN: 7 mg/dL (ref 6–23)
CO2: 21 mEq/L (ref 19–32)
CO2: 25 mEq/L (ref 19–32)
Calcium: 8.9 mg/dL (ref 8.4–10.5)
Calcium: 9.2 mg/dL (ref 8.4–10.5)
Chloride: 104 mEq/L (ref 96–112)
Chloride: 104 mEq/L (ref 96–112)
Creatinine, Ser: 0.8 mg/dL (ref 0.4–1.2)
Creatinine, Ser: 0.86 mg/dL (ref 0.4–1.2)
GFR calc Af Amer: >60 mL/min (ref >60)
GFR calc Af Amer: >60 mL/min (ref >60)
GFR calc non Af Amer: >60 mL/min (ref >60)
GFR calc non Af Amer: >60 mL/min (ref >60)
Glucose, Bld: 135 mg/dL — ABNORMAL HIGH (ref 70–99)
Glucose, Bld: 93 mg/dL (ref 70–99)
Potassium: 3.2 mEq/L — ABNORMAL LOW (ref 3.5–5.1)
Potassium: 3.4 mEq/L — ABNORMAL LOW (ref 3.5–5.1)
Sodium: 136 mEq/L (ref 135–145)
Sodium: 138 mEq/L (ref 135–145)
Total Bilirubin: 0.7 mg/dL (ref 0.3–1.2)
Total Bilirubin: 0.7 mg/dL (ref 0.3–1.2)
Total Protein: 7 g/dL (ref 6.0–8.3)
Total Protein: 7.5 g/dL (ref 6.0–8.3)

## 2010-06-15 LAB — LIPID PANEL
Cholesterol: 189 mg/dL (ref 0–200)
HDL: 56 mg/dL (ref >39)
LDL Cholesterol: 123 mg/dL — ABNORMAL HIGH (ref 0–99)
Total CHOL/HDL Ratio: 3.4 RATIO
Triglycerides: 51 mg/dL (ref <150)
VLDL: 10 mg/dL (ref 0–40)

## 2010-06-15 LAB — DIFFERENTIAL
Basophils Absolute: 0 10*3/uL (ref 0.0–0.1)
Basophils Relative: 0 % (ref 0–1)
Eosinophils Absolute: 0 10*3/uL (ref 0.0–0.7)
Eosinophils Relative: 0 % (ref 0–5)
Lymphocytes Relative: 22 % (ref 12–46)
Lymphs Abs: 1.6 10*3/uL (ref 0.7–4.0)
Monocytes Absolute: 0.4 10*3/uL (ref 0.1–1.0)
Monocytes Relative: 6 % (ref 3–12)
Neutro Abs: 5.4 10*3/uL (ref 1.7–7.7)
Neutrophils Relative %: 72 % (ref 43–77)

## 2010-06-15 LAB — PROTIME-INR
INR: 1.1 (ref 0.00–1.49)
Prothrombin Time: 14.1 seconds (ref 11.6–15.2)

## 2010-06-15 LAB — CBC
HCT: 31.6 % — ABNORMAL LOW (ref 36.0–46.0)
HCT: 31.7 % — ABNORMAL LOW (ref 36.0–46.0)
HCT: 34.4 % — ABNORMAL LOW (ref 36.0–46.0)
Hemoglobin: 10.6 g/dL — ABNORMAL LOW (ref 12.0–15.0)
Hemoglobin: 10.8 g/dL — ABNORMAL LOW (ref 12.0–15.0)
Hemoglobin: 11.7 g/dL — ABNORMAL LOW (ref 12.0–15.0)
MCHC: 33.6 g/dL (ref 30.0–36.0)
MCHC: 34.1 g/dL (ref 30.0–36.0)
MCHC: 34.1 g/dL (ref 30.0–36.0)
MCV: 83 fL (ref 78.0–100.0)
MCV: 83.6 fL (ref 78.0–100.0)
MCV: 83.9 fL (ref 78.0–100.0)
Platelets: 229 10*3/uL (ref 150–400)
Platelets: 230 10*3/uL (ref 150–400)
Platelets: 268 10*3/uL (ref 150–400)
RBC: 3.77 MIL/uL — ABNORMAL LOW (ref 3.87–5.11)
RBC: 3.79 MIL/uL — ABNORMAL LOW (ref 3.87–5.11)
RBC: 4.14 MIL/uL (ref 3.87–5.11)
RDW: 15.8 % — ABNORMAL HIGH (ref 11.5–15.5)
RDW: 16 % — ABNORMAL HIGH (ref 11.5–15.5)
RDW: 16.4 % — ABNORMAL HIGH (ref 11.5–15.5)
WBC: 5.7 10*3/uL (ref 4.0–10.5)
WBC: 5.9 10*3/uL (ref 4.0–10.5)
WBC: 7.5 10*3/uL (ref 4.0–10.5)

## 2010-06-15 LAB — TROPONIN I
Troponin I: 0.02 ng/mL (ref 0.00–0.06)
Troponin I: 0.02 ng/mL (ref 0.00–0.06)
Troponin I: 0.03 ng/mL (ref 0.00–0.06)

## 2010-06-15 LAB — URINE MICROSCOPIC-ADD ON

## 2010-06-15 LAB — URINE CULTURE: Colony Count: 35000

## 2010-06-15 LAB — OSMOLALITY, URINE: Osmolality, Ur: 183 mOsm/kg — ABNORMAL LOW (ref 390–1090)

## 2010-06-15 LAB — URINALYSIS, ROUTINE W REFLEX MICROSCOPIC
Bilirubin Urine: NEGATIVE
Glucose, UA: NEGATIVE mg/dL
Ketones, ur: NEGATIVE mg/dL
Nitrite: NEGATIVE
Protein, ur: NEGATIVE mg/dL
Specific Gravity, Urine: 1.006 (ref 1.005–1.030)
Urobilinogen, UA: 0.2 mg/dL (ref 0.0–1.0)
pH: 7.5 (ref 5.0–8.0)

## 2010-06-15 LAB — CK TOTAL AND CKMB (NOT AT ARMC)
CK, MB: 1.7 ng/mL (ref 0.3–4.0)
CK, MB: 1.8 ng/mL (ref 0.3–4.0)
CK, MB: 1.9 ng/mL (ref 0.3–4.0)
Relative Index: INVALID (ref 0.0–2.5)
Relative Index: INVALID (ref 0.0–2.5)
Relative Index: INVALID (ref 0.0–2.5)
Total CK: 68 U/L (ref 7–177)
Total CK: 83 U/L (ref 7–177)
Total CK: 98 U/L (ref 7–177)

## 2010-06-15 LAB — APTT: aPTT: 26 seconds (ref 24–37)

## 2010-06-15 LAB — HEMOGLOBIN A1C
Hgb A1c MFr Bld: 6.1 % (ref 4.6–6.1)
Mean Plasma Glucose: 128 mg/dL

## 2010-06-15 LAB — TSH
TSH: 2.531 u[IU]/mL (ref 0.350–4.500)
TSH: 3.448 u[IU]/mL (ref 0.350–4.500)

## 2010-06-15 LAB — POCT CARDIAC MARKERS
CKMB, poc: <1 ng/mL — ABNORMAL LOW (ref 1.0–8.0)
Myoglobin, poc: 53.5 ng/mL (ref 12–200)
Troponin i, poc: <0.05 ng/mL (ref 0.00–0.09)

## 2010-06-15 LAB — D-DIMER, QUANTITATIVE (NOT AT ARMC): D-Dimer, Quant: 0.25 ug/mL-FEU (ref 0.00–0.48)

## 2010-06-15 LAB — SODIUM, URINE, RANDOM: Sodium, Ur: 59 mEq/L

## 2010-06-15 LAB — CREATININE, URINE, RANDOM: Creatinine, Urine: 24.8 mg/dL

## 2010-06-18 ENCOUNTER — Ambulatory Visit: Payer: Self-pay | Admitting: Family Medicine

## 2010-07-05 ENCOUNTER — Ambulatory Visit (INDEPENDENT_AMBULATORY_CARE_PROVIDER_SITE_OTHER): Payer: Commercial Managed Care - PPO | Admitting: Family Medicine

## 2010-07-05 DIAGNOSIS — E538 Deficiency of other specified B group vitamins: Secondary | ICD-10-CM

## 2010-07-05 MED ORDER — CYANOCOBALAMIN 1000 MCG/ML IJ SOLN
1000.0000 ug | Freq: Once | INTRAMUSCULAR | Status: AC
  Administered 2010-07-05: 1000 ug via INTRAMUSCULAR

## 2010-07-05 NOTE — Progress Notes (Signed)
  Subjective:    Patient ID: Jenna Young, female    DOB: 1931-11-09, 77 y.o.   MRN: [id number]  HPI  Here for B12 injection  Review of Systems     Objective:   Physical Exam        Assessment & Plan:

## 2010-10-04 ENCOUNTER — Ambulatory Visit (INDEPENDENT_AMBULATORY_CARE_PROVIDER_SITE_OTHER): Payer: Medicare Other | Admitting: Family Medicine

## 2010-10-04 ENCOUNTER — Other Ambulatory Visit: Payer: Self-pay | Admitting: *Deleted

## 2010-10-04 DIAGNOSIS — E538 Deficiency of other specified B group vitamins: Secondary | ICD-10-CM

## 2010-10-04 MED ORDER — CYANOCOBALAMIN 1000 MCG/ML IJ SOLN
1000.0000 ug | Freq: Once | INTRAMUSCULAR | Status: AC
  Administered 2010-10-04: 1000 ug via INTRAMUSCULAR

## 2010-10-04 MED ORDER — RAMIPRIL 10 MG PO CAPS: 10.0000 mg | ORAL_CAPSULE | Freq: Every day | ORAL | Status: DC

## 2010-10-05 NOTE — Progress Notes (Signed)
b12 shot 

## 2010-10-17 ENCOUNTER — Encounter: Payer: Self-pay | Admitting: Family Medicine

## 2010-10-22 ENCOUNTER — Ambulatory Visit (INDEPENDENT_AMBULATORY_CARE_PROVIDER_SITE_OTHER): Payer: Medicare Other | Admitting: Family Medicine

## 2010-10-22 ENCOUNTER — Encounter: Payer: Self-pay | Admitting: Family Medicine

## 2010-10-22 DIAGNOSIS — J309 Allergic rhinitis, unspecified: Secondary | ICD-10-CM

## 2010-10-22 DIAGNOSIS — M810 Age-related osteoporosis without current pathological fracture: Secondary | ICD-10-CM

## 2010-10-22 DIAGNOSIS — IMO0002 Reserved for concepts with insufficient information to code with codable children: Secondary | ICD-10-CM

## 2010-10-22 MED ORDER — FLUTICASONE PROPIONATE 50 MCG/ACT NA SUSP: 2.0000 | Freq: Every day | NASAL | Status: DC

## 2010-10-22 NOTE — Assessment & Plan Note (Signed)
Worse lately with constant post nasal drip and runny/stuffy nose This may exacerbate her inner ear problem also  Will try zyrtec otc 10 mg daily Also flonase ns Update if not imp

## 2010-10-22 NOTE — Progress Notes (Signed)
Subjective:    Patient ID: Jenna Young, female    DOB: [birth year], 77 y.o.   MRN: [id number]  HPI Here for f/u of HTN  Wt is stable Summer is going pretty good overall   Has had some vertigo off and on   Is clearing her throat - but not coughing  Runny nose every am  Also some congestion  occ pains in her R ear at times  Took allergy shots for years    bp is 126/64- very good today No cp or palpitations or sob on exercion On norvasc and altace currently   Mam was last October   Last dexa years ago- pt does not want to follow it any longer Takes her ca and vit D One fracture - broke her leg - 5 years ago  Patient Active Problem List  Diagnoses  . VITAMIN B12 DEFICIENCY  . UNSPECIFIED VITAMIN D DEFICIENCY  . HYPERLIPIDEMIA  . ANXIETY  . GRIEF REACTION  . VENOUS INSUFFICIENCY  . ALLERGIC RHINITIS  . GERD  . OSTEOPOROSIS  . HYPERTENSION NEC   Past Medical History  Diagnosis Date  . GERD (gastroesophageal reflux disease)   . Allergic rhinitis   . Hyperlipidemia   . Osteopenia   . Osteoporosis   . Anxiety   . Hypertension    Past Surgical History  Procedure Date  . Appendectomy   . Cesarean section   . Cholecystectomy   . Ovarian cyst surgery   . Fracture surgery 07/2008    neck  . Breast surgery 10/10    benign biopsy  negative   History  Substance Use Topics  . Smoking status: Never Smoker   . Smokeless tobacco: Not on file  . Alcohol Use: No   Family History  Problem Relation Age of Onset  . Hypertension Mother   . Heart disease Father   . Macular degeneration Sister   . Diabetes Maternal Aunt   . Cancer Maternal Aunt     throat   Allergies  Allergen Reactions  . Buspirone Hcl     REACTION: HEART PALPITATIONS  . Codeine     REACTION: nausea and vomiting  . Hydrochlorothiazide     REACTION: syncope-possibly from dehydration  . Lansoprazole     REACTION: abd pain  . Minocycline Hcl   . Penicillins     REACTION: mouth numbness  .  Sulfa Antibiotics Nausea Only   Current Outpatient Prescriptions on File Prior to Visit  Medication Sig Dispense Refill  . amLODipine (NORVASC) 5 MG tablet Take 5 mg by mouth daily.        . calcium-vitamin D (OSCAL WITH D) 500-200 MG-UNIT per tablet Take 2 tablets by mouth daily.        . Cholecalciferol (VITAMIN D) 2000 UNITS CAPS Take 1 capsule by mouth daily.        . cyanocobalamin (,VITAMIN B-12,) 1000 MCG/ML injection Inject 1,000 mcg into the muscle. One injection every 3 months       . fluticasone (FLONASE) 50 MCG/ACT nasal spray Place 2 sprays into the nose daily. In each nostril       . ramipril (ALTACE) 10 MG capsule Take 1 capsule (10 mg total) by mouth daily.  30 capsule  0  . ALPRAZolam (XANAX) 0.5 MG tablet Take 0.5 mg by mouth at bedtime as needed.        Marland Kitchen aspirin 325 MG tablet Take 325 mg by mouth as needed.  Review of Systems Review of Systems  Constitutional: Negative for fever, appetite change, fatigue and unexpected weight change.  Eyes: Negative for pain and visual disturbance.  Respiratory: Negative for cough and shortness of breath.   Cardiovascular: Negative.  for cp or palpitations Gastrointestinal: Negative for nausea, diarrhea and constipation.  Genitourinary: Negative for urgency and frequency.  Skin: Negative for pallor. or rash  Neurological: Negative for weakness, light-headedness, numbness and headaches.  Hematological: Negative for adenopathy. Does not bruise/bleed easily.  Psychiatric/Behavioral: Negative for dysphoric mood. The patient is not nervous/anxious.          Objective:   Physical Exam  Constitutional: She appears well-developed and well-nourished. No distress.  HENT:  Head: Normocephalic and atraumatic.  Mouth/Throat: Oropharynx is clear and moist.       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  Pupils are equal, round, and reactive to light. Right eye exhibits no discharge. Left eye exhibits no discharge.  Neck: Normal range of motion. Neck supple. No JVD present. Carotid bruit is not present. No thyromegaly present.  Cardiovascular: Normal rate, regular rhythm, normal heart sounds and intact distal pulses.   No murmur heard.      Varicosities noted No edema   Pulmonary/Chest: Effort normal and breath sounds normal. No respiratory distress. She has no wheezes.  Abdominal: Soft. Bowel sounds are normal. She exhibits no distension, no abdominal bruit and no mass. There is no tenderness.  Musculoskeletal: Normal range of motion. She exhibits no edema and no tenderness.  Lymphadenopathy:    She has no cervical adenopathy.  Neurological: She is alert. She has normal reflexes. No cranial nerve deficit. Coordination normal.  Skin: Skin is warm and dry. No rash noted. No erythema. No pallor.  Psychiatric: She has a normal mood and affect.          Assessment & Plan:

## 2010-10-22 NOTE — Patient Instructions (Signed)
We need to treat your allergies  I recommend 10 mg of zyrtec over the counter -- there is a liquid availible  In addition- please use flonase nasal spray as directed  I think this will help the vertigo too Blood pressure is good  No other changes  If you change your mind about getting bone density test - let me know  Will see you at your physical

## 2010-10-22 NOTE — Assessment & Plan Note (Signed)
Good control with 126/64 today  Disc habits- diet and exercise No change in meds

## 2010-11-01 ENCOUNTER — Telehealth: Payer: Self-pay | Admitting: *Deleted

## 2010-11-01 MED ORDER — ALPRAZOLAM 0.5 MG PO TABS: 0.5000 mg | ORAL_TABLET | Freq: Every day | ORAL | Status: DC | PRN

## 2010-11-01 NOTE — Telephone Encounter (Signed)
Rx called in and patient notified. Instructed her to call and schedule a follow up if she finds she is needing it daily. She verbalized understanding.

## 2010-11-01 NOTE — Telephone Encounter (Signed)
Xanax is fine to use for emergencies/ very stressful times But if she needs it more than once per week - please have her f/u to disc other more long term options and even counseling Px written for call in   Do not drive on this med as it sedates

## 2010-11-01 NOTE — Telephone Encounter (Signed)
Pt is asking if she can have something mild for anxiety.  She says she has a lot going on right now.  Xanax is on her med list, she says she ran out and never asked for a refill.  Uses [facility] [address]

## 2010-11-06 ENCOUNTER — Other Ambulatory Visit: Payer: Self-pay | Admitting: *Deleted

## 2010-11-06 MED ORDER — RAMIPRIL 10 MG PO CAPS: 10.0000 mg | ORAL_CAPSULE | Freq: Every day | ORAL | Status: DC

## 2010-11-28 ENCOUNTER — Other Ambulatory Visit (INDEPENDENT_AMBULATORY_CARE_PROVIDER_SITE_OTHER): Payer: Self-pay | Admitting: General Surgery

## 2010-11-28 DIAGNOSIS — Z1231 Encounter for screening mammogram for malignant neoplasm of breast: Secondary | ICD-10-CM

## 2010-12-04 ENCOUNTER — Other Ambulatory Visit (INDEPENDENT_AMBULATORY_CARE_PROVIDER_SITE_OTHER): Payer: Medicare Other

## 2010-12-04 DIAGNOSIS — E538 Deficiency of other specified B group vitamins: Secondary | ICD-10-CM

## 2010-12-04 DIAGNOSIS — I10 Essential (primary) hypertension: Secondary | ICD-10-CM

## 2010-12-04 DIAGNOSIS — J309 Allergic rhinitis, unspecified: Secondary | ICD-10-CM

## 2010-12-04 DIAGNOSIS — M810 Age-related osteoporosis without current pathological fracture: Secondary | ICD-10-CM

## 2010-12-04 DIAGNOSIS — E785 Hyperlipidemia, unspecified: Secondary | ICD-10-CM

## 2010-12-04 DIAGNOSIS — F419 Anxiety disorder, unspecified: Secondary | ICD-10-CM

## 2010-12-04 DIAGNOSIS — K219 Gastro-esophageal reflux disease without esophagitis: Secondary | ICD-10-CM

## 2010-12-04 DIAGNOSIS — F411 Generalized anxiety disorder: Secondary | ICD-10-CM

## 2010-12-04 DIAGNOSIS — E559 Vitamin D deficiency, unspecified: Secondary | ICD-10-CM

## 2010-12-04 LAB — HEPATIC FUNCTION PANEL
ALT: 12 U/L (ref 0–35)
AST: 18 U/L (ref 0–37)
Albumin: 4.3 g/dL (ref 3.5–5.2)
Alkaline Phosphatase: 81 U/L (ref 39–117)
Bilirubin, Direct: 0 mg/dL (ref 0.0–0.3)
Total Bilirubin: 0.5 mg/dL (ref 0.3–1.2)
Total Protein: 8.7 g/dL — ABNORMAL HIGH (ref 6.0–8.3)

## 2010-12-04 LAB — CBC WITH DIFFERENTIAL/PLATELET
Basophils Absolute: 0 10*3/uL (ref 0.0–0.1)
Basophils Relative: 0.5 % (ref 0.0–3.0)
Eosinophils Absolute: 0.1 10*3/uL (ref 0.0–0.7)
Eosinophils Relative: 1.8 % (ref 0.0–5.0)
HCT: 36.4 % (ref 36.0–46.0)
Hemoglobin: 12 g/dL (ref 12.0–15.0)
Lymphocytes Relative: 41.5 % (ref 12.0–46.0)
Lymphs Abs: 2.4 10*3/uL (ref 0.7–4.0)
MCHC: 33 g/dL (ref 30.0–36.0)
MCV: 83.8 fl (ref 78.0–100.0)
Monocytes Absolute: 0.4 10*3/uL (ref 0.1–1.0)
Monocytes Relative: 6.4 % (ref 3.0–12.0)
Neutro Abs: 2.9 10*3/uL (ref 1.4–7.7)
Neutrophils Relative %: 49.8 % (ref 43.0–77.0)
Platelets: 278 10*3/uL (ref 150.0–400.0)
RBC: 4.35 Mil/uL (ref 3.87–5.11)
RDW: 16.8 % — ABNORMAL HIGH (ref 11.5–14.6)
WBC: 5.7 10*3/uL (ref 4.5–10.5)

## 2010-12-04 LAB — BASIC METABOLIC PANEL
BUN: 10 mg/dL (ref 6–23)
CO2: 27 mEq/L (ref 19–32)
Calcium: 9.8 mg/dL (ref 8.4–10.5)
Chloride: 104 mEq/L (ref 96–112)
Creatinine, Ser: 0.8 mg/dL (ref 0.4–1.2)
GFR: 70.87 mL/min (ref >60.00)
Glucose, Bld: 93 mg/dL (ref 70–99)
Potassium: 4.5 mEq/L (ref 3.5–5.1)
Sodium: 139 mEq/L (ref 135–145)

## 2010-12-04 LAB — LIPID PANEL
Cholesterol: 209 mg/dL — ABNORMAL HIGH (ref 0–200)
HDL: 61.6 mg/dL (ref >39.00)
Total CHOL/HDL Ratio: 3
Triglycerides: 147 mg/dL (ref 0.0–149.0)
VLDL: 29.4 mg/dL (ref 0.0–40.0)

## 2010-12-04 LAB — LDL CHOLESTEROL, DIRECT: Direct LDL: 114.5 mg/dL

## 2010-12-05 LAB — VITAMIN D 25 HYDROXY (VIT D DEFICIENCY, FRACTURES): Vit D, 25-Hydroxy: 43 ng/mL (ref 30–89)

## 2010-12-05 LAB — VITAMIN B12: Vitamin B-12: 242 pg/mL (ref 211–911)

## 2010-12-05 LAB — TSH: TSH: 2.56 u[IU]/mL (ref 0.35–5.50)

## 2010-12-11 ENCOUNTER — Encounter: Payer: Self-pay | Admitting: Family Medicine

## 2010-12-11 ENCOUNTER — Ambulatory Visit (INDEPENDENT_AMBULATORY_CARE_PROVIDER_SITE_OTHER): Payer: Medicare Other | Admitting: Family Medicine

## 2010-12-11 VITALS — BP 130/64 | HR 84 | Temp 97.9°F | Ht 62.0 in | Wt 136.0 lb

## 2010-12-11 DIAGNOSIS — Z01419 Encounter for gynecological examination (general) (routine) without abnormal findings: Secondary | ICD-10-CM | POA: Insufficient documentation

## 2010-12-11 DIAGNOSIS — E785 Hyperlipidemia, unspecified: Secondary | ICD-10-CM

## 2010-12-11 DIAGNOSIS — IMO0002 Reserved for concepts with insufficient information to code with codable children: Secondary | ICD-10-CM

## 2010-12-11 DIAGNOSIS — E538 Deficiency of other specified B group vitamins: Secondary | ICD-10-CM

## 2010-12-11 DIAGNOSIS — Z23 Encounter for immunization: Secondary | ICD-10-CM

## 2010-12-11 DIAGNOSIS — E559 Vitamin D deficiency, unspecified: Secondary | ICD-10-CM

## 2010-12-11 DIAGNOSIS — M810 Age-related osteoporosis without current pathological fracture: Secondary | ICD-10-CM

## 2010-12-11 DIAGNOSIS — I10 Essential (primary) hypertension: Secondary | ICD-10-CM

## 2010-12-11 NOTE — Progress Notes (Signed)
Subjective:    Patient ID: Jenna Young, female    DOB: 1932-02-21, 77 y.o.   MRN: [id number]  HPI Here for check up of chronic health problems and to review health mt list Is feeling fine overall  Flu shot today   No new medical issues   HTN 130/64 Good conrol No side eff from med- no problems  No cp or palp or headache  Wt stable/ down 2 lb with bmi of 24-good  Lipids in fair control and stable  Lab Results  Component Value Date   CHOL 209* 12/04/2010   CHOL 209* 04/23/2010   CHOL  Value: 189        ATP III CLASSIFICATION:  <200     mg/dL   Desirable  657-846  mg/dL   Borderline High  >=962    mg/dL   High        11/14/2839   Lab Results  Component Value Date   HDL 61.60 12/04/2010   HDL 32.44 04/23/2010   HDL 56 11/14/2008   Lab Results  Component Value Date   LDLCALC  Value: 123        Total Cholesterol/HDL:CHD Risk Coronary Heart Disease Risk Table                     Men   Women  1/2 Average Risk   3.4   3.3  Average Risk       5.0   4.4  2 X Average Risk   9.6   7.1  3 X Average Risk  23.4   11.0        Use the calculated Patient Ratio above and the CHD Risk Table to determine the patient's CHD Risk.        ATP III CLASSIFICATION (LDL):  <100     mg/dL   Optimal  010-272  mg/dL   Near or Above                    Optimal  130-159  mg/dL   Borderline  536-644  mg/dL   High  >034     mg/dL   Very High* 09/11/2593   Lab Results  Component Value Date   TRIG 147.0 12/04/2010   TRIG 106.0 04/23/2010   TRIG 51 11/14/2008   Lab Results  Component Value Date   CHOLHDL 3 12/04/2010   CHOLHDL 3 04/23/2010   CHOLHDL 3.4 11/14/2008   Lab Results  Component Value Date   LDLDIRECT 114.5 12/04/2010   LDLDIRECT 117.1 04/23/2010   LDLDIRECT 129.8 03/24/2008   diet- is good to eat a fairly healthy diet  Can always do better  No red meat  occ french fries   Vit D def- ok with current suppl vit D level 43 OP- aware of this  Is taking her ca and vitamin D  Last dexa- was about 5 years with a  fracture  Is not interested in further dexa at this time   Vit B12 def Level 242 low normal - gets shots ever 3 months   Tdap 09 Flu shot today Pneumovax- interested in that today  Zoster- is not interested yet   Mam - is planned next Friday  No lumps on self exam  Needs breast exam   Colon screen - never had a colonoscopy -and does not want one  Wants to do ifob instead   Pap -- long time ago  No abnormal paps in past at all  No gyn symptoms  No new sexual partners   Patient Active Problem List  Diagnoses  . VITAMIN B12 DEFICIENCY  . UNSPECIFIED VITAMIN D DEFICIENCY  . HYPERLIPIDEMIA  . ANXIETY  . GRIEF REACTION  . VENOUS INSUFFICIENCY  . ALLERGIC RHINITIS  . GERD  . OSTEOPOROSIS  . HYPERTENSION NEC  . Gynecological examination   Past Medical History  Diagnosis Date  . GERD (gastroesophageal reflux disease)   . Allergic rhinitis   . Hyperlipidemia   . Osteopenia   . Osteoporosis   . Anxiety   . Hypertension    Past Surgical History  Procedure Date  . Appendectomy   . Cesarean section   . Cholecystectomy   . Ovarian cyst surgery   . Fracture surgery 07/2008    neck  . Breast surgery 10/10    benign biopsy  negative   History  Substance Use Topics  . Smoking status: Former Smoker    Quit date: 03/11/1968  . Smokeless tobacco: Not on file  . Alcohol Use: No   Family History  Problem Relation Age of Onset  . Hypertension Mother   . Heart disease Father   . Macular degeneration Sister   . Diabetes Maternal Aunt   . Cancer Maternal Aunt     throat   Allergies  Allergen Reactions  . Buspirone Hcl     REACTION: HEART PALPITATIONS  . Codeine     REACTION: nausea and vomiting  . Hydrochlorothiazide     REACTION: syncope-possibly from dehydration  . Lansoprazole     REACTION: abd pain  . Minocycline Hcl   . Penicillins     REACTION: mouth numbness  . Sulfa Antibiotics Nausea Only   Current Outpatient Prescriptions on File Prior to Visit    Medication Sig Dispense Refill  . amLODipine (NORVASC) 5 MG tablet Take 5 mg by mouth daily.        . Cholecalciferol (VITAMIN D) 2000 UNITS CAPS Take 1 capsule by mouth daily.        . cyanocobalamin (,VITAMIN B-12,) 1000 MCG/ML injection Inject 1,000 mcg into the muscle. One injection every 3 months       . fluticasone (FLONASE) 50 MCG/ACT nasal spray Place 2 sprays into the nose daily.  16 g  11  . ramipril (ALTACE) 10 MG capsule Take 1 capsule (10 mg total) by mouth daily.  30 capsule  6  . ALPRAZolam (XANAX) 0.5 MG tablet Take 1 tablet (0.5 mg total) by mouth daily as needed for anxiety.  30 tablet  0  . aspirin 325 MG tablet Take 325 mg by mouth as needed.        . calcium-vitamin D (OSCAL WITH D) 500-200 MG-UNIT per tablet Take 2 tablets by mouth daily.        . DimenhyDRINATE (DRAMAMINE) 50 MG CHEW Chew 1 tablet by mouth as needed.              Review of Systems Review of Systems  Constitutional: Negative for fever, appetite change, fatigue and unexpected weight change.  Eyes: Negative for pain and visual disturbance.  Respiratory: Negative for cough and shortness of breath.   Cardiovascular: Negative for cp or palpitations    Gastrointestinal: Negative for nausea, diarrhea and constipation.  Genitourinary: Negative for urgency and frequency.  Skin: Negative for pallor or rash   Neurological: Negative for weakness, light-headedness, numbness and headaches.  Hematological: Negative for adenopathy. Does not bruise/bleed easily.  Psychiatric/Behavioral: Negative for dysphoric mood. The patient  is not nervous/anxious.          Objective:   Physical Exam  Constitutional: She appears well-developed and well-nourished. No distress.  HENT:  Head: Normocephalic and atraumatic.  Right Ear: External ear normal.  Left Ear: External ear normal.  Nose: Nose normal.  Mouth/Throat: Oropharynx is clear and moist.  Eyes: Conjunctivae and EOM are normal. Pupils are equal, round, and  reactive to light.  Neck: Normal range of motion. Neck supple. No JVD present. Carotid bruit is not present. Erythema present. No thyromegaly present.  Cardiovascular: Normal rate, regular rhythm, normal heart sounds and intact distal pulses.  Exam reveals no gallop.   Pulmonary/Chest: Effort normal and breath sounds normal. No respiratory distress. She has no wheezes. She exhibits no tenderness.  Abdominal: Soft. Bowel sounds are normal. She exhibits no distension, no abdominal bruit and no mass. There is no tenderness.  Genitourinary: Vagina normal and uterus normal. No breast swelling, tenderness, discharge or bleeding. No vaginal discharge found.       No M or tenderness on bimanual exam Pap not done  Musculoskeletal: Normal range of motion. She exhibits no edema and no tenderness.  Lymphadenopathy:    She has no cervical adenopathy.  Neurological: She is alert. She has normal reflexes. No cranial nerve deficit. Coordination normal.  Skin: Skin is warm and dry. No rash noted. No erythema. No pallor.  Psychiatric: She has a normal mood and affect.          Assessment & Plan:

## 2010-12-11 NOTE — Assessment & Plan Note (Signed)
Pt declines further dexa  fx in past  Will continue ca and D  D level is theraputic

## 2010-12-11 NOTE — Assessment & Plan Note (Signed)
This is controlled fairly well with diet  LDL in one teens Rev low sat fat diet  Could cut down on fried foods

## 2010-12-11 NOTE — Assessment & Plan Note (Signed)
Level in 60s with current suppl Rev this with pt and imp to bone and general health

## 2010-12-11 NOTE — Assessment & Plan Note (Signed)
Level low nl- in 200s  Will continue inj every 3 mo  Asked her to add B complex vit daily also otc

## 2010-12-11 NOTE — Patient Instructions (Addendum)
Get any generic B complex vitamin over the counter and take one daily  Pneumonia vaccine today Flu vaccine today  Pelvic and breast exam today Don't forget your mammogram  Do the stool card for colon cancer screening please  Continue your calcium and vitamin D for your bones  Stay active

## 2010-12-11 NOTE — Assessment & Plan Note (Signed)
Exam today without pap  No complaints or problems No hx of abn paps and not sexually active

## 2010-12-11 NOTE — Assessment & Plan Note (Signed)
bp is in good control currently No change in medicines  Enc to stay active Labs reviewed

## 2010-12-21 ENCOUNTER — Ambulatory Visit
Admission: RE | Admit: 2010-12-21 | Discharge: 2010-12-21 | Disposition: A | Discharge status: Home / Self Care | Payer: Medicare Other | Source: Ambulatory Visit | Attending: General Surgery | Admitting: General Surgery

## 2010-12-21 DIAGNOSIS — Z1231 Encounter for screening mammogram for malignant neoplasm of breast: Secondary | ICD-10-CM

## 2011-01-03 ENCOUNTER — Encounter: Payer: Self-pay | Admitting: *Deleted

## 2011-01-03 ENCOUNTER — Other Ambulatory Visit: Payer: Medicare Other

## 2011-01-03 ENCOUNTER — Other Ambulatory Visit: Payer: Self-pay | Admitting: Family Medicine

## 2011-01-03 DIAGNOSIS — Z1211 Encounter for screening for malignant neoplasm of colon: Secondary | ICD-10-CM

## 2011-01-03 LAB — FECAL OCCULT BLOOD, IMMUNOCHEMICAL: Fecal Occult Bld: NEGATIVE

## 2011-01-07 ENCOUNTER — Ambulatory Visit (INDEPENDENT_AMBULATORY_CARE_PROVIDER_SITE_OTHER): Payer: Medicare Other | Admitting: *Deleted

## 2011-01-07 DIAGNOSIS — E538 Deficiency of other specified B group vitamins: Secondary | ICD-10-CM

## 2011-01-07 MED ORDER — CYANOCOBALAMIN 1000 MCG/ML IJ SOLN
1000.0000 ug | Freq: Once | INTRAMUSCULAR | Status: AC
  Administered 2011-01-07: 1000 ug via INTRAMUSCULAR

## 2011-03-06 ENCOUNTER — Encounter: Payer: Self-pay | Admitting: Family Medicine

## 2011-03-06 ENCOUNTER — Ambulatory Visit (INDEPENDENT_AMBULATORY_CARE_PROVIDER_SITE_OTHER): Payer: Medicare Other | Admitting: Family Medicine

## 2011-03-06 VITALS — BP 124/70 | HR 84 | Temp 98.8°F | Ht 62.0 in | Wt 137.5 lb

## 2011-03-06 DIAGNOSIS — J069 Acute upper respiratory infection, unspecified: Secondary | ICD-10-CM | POA: Insufficient documentation

## 2011-03-06 DIAGNOSIS — J029 Acute pharyngitis, unspecified: Secondary | ICD-10-CM

## 2011-03-06 DIAGNOSIS — R05 Cough: Secondary | ICD-10-CM

## 2011-03-06 DIAGNOSIS — R059 Cough, unspecified: Secondary | ICD-10-CM

## 2011-03-06 LAB — POCT RAPID STREP A (OFFICE): Rapid Strep A Screen: NEGATIVE

## 2011-03-06 MED ORDER — BENZONATATE 100 MG PO CAPS: 100.0000 mg | ORAL_CAPSULE | Freq: Three times a day (TID) | ORAL | Status: AC | PRN

## 2011-03-06 NOTE — Assessment & Plan Note (Signed)
With nl exam except for some throat irritation (rapid strep neg) Wonder about post viral vs ace cough Will hold altace shortly and update Also trial of tessalon  Will make plan once she updates me Of course inst to call back if worse

## 2011-03-06 NOTE — Progress Notes (Signed)
Subjective:    Patient ID: Jenna Young, female    DOB: 15-Apr-1931, 77 y.o.   MRN: [id number]  HPI Dry cough is driving her crazy for about 3 weeks-- dry cough  L side of throat is sore too  Has coughed so hard until her chest is sore  Up all night coughing Sunday night   Went to urgent care on Friday - told her it was allergy related and gave her zyrtec and gave her cough syrup with a narcotic -- did not help at all  She thinks they dx her with allergies- but not sure   Has not had a cold  No fever  Sore throat is irritating but not severe  Can swallow  No rash No exp to strep   Is on altace -- has never made her cough before - but knows it is possible   Patient Active Problem List  Diagnoses  . VITAMIN B12 DEFICIENCY  . UNSPECIFIED VITAMIN D DEFICIENCY  . HYPERLIPIDEMIA  . ANXIETY  . GRIEF REACTION  . VENOUS INSUFFICIENCY  . ALLERGIC RHINITIS  . GERD  . OSTEOPOROSIS  . HYPERTENSION NEC  . Gynecological examination  . Cough  . Sore throat   Past Medical History  Diagnosis Date  . GERD (gastroesophageal reflux disease)   . Allergic rhinitis   . Hyperlipidemia   . Osteopenia   . Osteoporosis   . Anxiety   . Hypertension    Past Surgical History  Procedure Date  . Appendectomy   . Cesarean section   . Cholecystectomy   . Ovarian cyst surgery   . Fracture surgery 07/2008    neck  . Breast surgery 10/10    benign biopsy  negative   History  Substance Use Topics  . Smoking status: Former Smoker    Quit date: 03/11/1968  . Smokeless tobacco: Not on file  . Alcohol Use: No   Family History  Problem Relation Age of Onset  . Hypertension Mother   . Heart disease Father   . Macular degeneration Sister   . Diabetes Maternal Aunt   . Cancer Maternal Aunt     throat   Allergies  Allergen Reactions  . Buspirone Hcl     REACTION: HEART PALPITATIONS  . Codeine     REACTION: nausea and vomiting  . Hydrochlorothiazide     REACTION: syncope-possibly from  dehydration  . Lansoprazole     REACTION: abd pain  . Minocycline Hcl   . Penicillins     REACTION: mouth numbness  . Sulfa Antibiotics Nausea Only   Current Outpatient Prescriptions on File Prior to Visit  Medication Sig Dispense Refill  . amLODipine (NORVASC) 5 MG tablet Take 5 mg by mouth daily.        Marland Kitchen aspirin 325 MG tablet Take 325 mg by mouth as needed.        . calcium-vitamin D (OSCAL WITH D) 500-200 MG-UNIT per tablet Take 2 tablets by mouth daily.        . Cholecalciferol (VITAMIN D) 2000 UNITS CAPS Take 1 capsule by mouth daily.        . cyanocobalamin (,VITAMIN B-12,) 1000 MCG/ML injection Inject 1,000 mcg into the muscle. One injection every 3 months       . Famotidine (PEPCID PO) Take 1 tablet by mouth daily as needed.        . fluticasone (FLONASE) 50 MCG/ACT nasal spray Place 2 sprays into the nose daily.  16 g  11  .  loratadine (CLARITIN) 10 MG tablet Take 10 mg by mouth daily.        . ramipril (ALTACE) 10 MG capsule Take 1 capsule (10 mg total) by mouth daily.  30 capsule  6  . ALPRAZolam (XANAX) 0.5 MG tablet Take 1 tablet (0.5 mg total) by mouth daily as needed for anxiety.  30 tablet  0  . DimenhyDRINATE (DRAMAMINE) 50 MG CHEW Chew 1 tablet by mouth as needed.           Review of Systems Review of Systems  Constitutional: Negative for fever, appetite change, fatigue and unexpected weight change.  Eyes: Negative for pain and visual disturbance.  ENT pos for post nasal drainage mild and sore throat - neg for runny and stuffy nose and ear pain  Respiratory: Negative for shortness of breath, wheeze, or cough that is productive   Cardiovascular: Negative for cp or palpitations    Gastrointestinal: Negative for nausea, diarrhea and constipation.  Genitourinary: Negative for urgency and frequency.  Skin: Negative for pallor or rash   Neurological: Negative for weakness, light-headedness, numbness and headaches.  Hematological: Negative for adenopathy. Does not  bruise/bleed easily.  Psychiatric/Behavioral: Negative for dysphoric mood. The patient is not nervous/anxious.          Objective:   Physical Exam  Constitutional: She appears well-developed and well-nourished. No distress.  HENT:  Head: Normocephalic and atraumatic.  Right Ear: External ear normal.  Left Ear: External ear normal.       Mild erythema L side of throat without swelling or ulceration Nares are boggy but not congested  No sinus tenderness  Eyes: Conjunctivae and EOM are normal. Pupils are equal, round, and reactive to light. Right eye exhibits no discharge. Left eye exhibits no discharge. No scleral icterus.  Neck: Normal range of motion. Neck supple. No JVD present. No thyromegaly present.  Cardiovascular: Normal rate, regular rhythm and normal heart sounds.   No murmur heard. Pulmonary/Chest: Effort normal and breath sounds normal. No respiratory distress. She has no wheezes. She has no rales. She exhibits no tenderness.  Musculoskeletal: She exhibits no edema and no tenderness.       No acute joint changes  Lymphadenopathy:    She has no cervical adenopathy.  Neurological: She is alert. She has normal reflexes.  Skin: Skin is warm and dry. No rash noted. No erythema. No pallor.  Psychiatric: She has a normal mood and affect.          Assessment & Plan:

## 2011-03-06 NOTE — Patient Instructions (Addendum)
Strep test is negative  Drink lots of fluids  Hold your altace (ramipril) for 3-4 days and then call and let me know how your cough is doing  Also try tessalon pills - swallow them whole / don't chew them  If you develop fever or productive cough -let me know

## 2011-03-06 NOTE — Assessment & Plan Note (Signed)
Mild redness on L without swelling (suspect due to cough) Neg rapid strep  Plan is to push fluids and try to control the cough

## 2011-03-11 ENCOUNTER — Telehealth: Payer: Self-pay | Admitting: Internal Medicine

## 2011-03-11 NOTE — Telephone Encounter (Signed)
Patient stopped her Ramipril for 4 days like you suggested to see if her cough would go away.  She states it's better but not completely gone.  It's only at night.

## 2011-03-11 NOTE — Telephone Encounter (Signed)
Patient notified as instructed by telephone. 

## 2011-03-11 NOTE — Telephone Encounter (Signed)
Ok- give it until the end of the week- update me on Friday as to status of the cough -- we may need to change her medicine

## 2011-03-15 ENCOUNTER — Telehealth: Payer: Self-pay | Admitting: Family Medicine

## 2011-03-15 NOTE — Telephone Encounter (Signed)
Spoke with pt and she was calling back with update since she had stopped taking Altace due to a persistent cough. Pt said cough is much better since stopped Altace; only occasional cough. Pt's BP on 03/14/11 was 152/66. Today her BP was 122/65. Pt said she feels fine and is still taking the Amlodipine 5 mg daily. Pt said she will continue monitoring her BP. Pt said OK to hear from Dr Milinda Antis next week but if she has elevated BP over weekend she will call call a nurse or go to UC.Pt uses [employer] [facility] and can be reached at [phone].

## 2011-03-15 NOTE — Telephone Encounter (Signed)
Patient has questions about blood pressure meds.  Please call back.

## 2011-03-17 NOTE — Telephone Encounter (Signed)
I'm glad cough is better  That 2nd bp is excellent - so check is through this week - if over 140/90 will need to add med , otherwise will not  Update me at end of the week

## 2011-03-18 NOTE — Telephone Encounter (Signed)
Patient notified as instructed by telephone. 

## 2011-03-19 ENCOUNTER — Telehealth: Payer: Self-pay | Admitting: Internal Medicine

## 2011-03-19 MED ORDER — LOSARTAN POTASSIUM 50 MG PO TABS: 50.0000 mg | ORAL_TABLET | Freq: Every day | ORAL | Status: DC

## 2011-03-19 NOTE — Telephone Encounter (Signed)
Patient notified as instructed by telephone. Medication phoned to [facility] [address] pharmacy as instructed.  

## 2011-03-19 NOTE — Telephone Encounter (Signed)
Patient called today about blood pressure she states on Sunday it was 152/61. Last night it ran 174/74 and this morning it has ran 153/69 and she checked it again it was 163/67.   She states she was supposed to call and update you on her blood pressure.

## 2011-03-19 NOTE — Telephone Encounter (Signed)
Thanks for the update  I'm going to go ahead and replace her altace with cozaar (losartan)  Update me with bp readings next week Px written for call in  Or elect transmission

## 2011-03-27 ENCOUNTER — Telehealth: Payer: Self-pay | Admitting: Internal Medicine

## 2011-03-27 NOTE — Telephone Encounter (Signed)
Patient called and stated you wanted to receive a update on her BP since she switched medication:  Here are her readings:   It is flucurating 184/70 to 110/57 and this morning it was 144/61 and last night it was 161/68.

## 2011-03-27 NOTE — Telephone Encounter (Signed)
Increase her cozaar to 2 pills each am and update me early next week with some bp  If better will inc her dose to 100  Thanks

## 2011-03-27 NOTE — Telephone Encounter (Signed)
Patient notified as instructed by telephone. Pt will call back next week with BP readings and if BP drops too low pt will call back sooner.

## 2011-04-03 ENCOUNTER — Telehealth: Payer: Self-pay | Admitting: *Deleted

## 2011-04-03 NOTE — Telephone Encounter (Signed)
Patient called stating that she was to report back to you on her BP readings after increasing her Cozaar. Patient states that she started out taking 2 every am and changed that to two times a day. Patient states that she does not like this medication because her BP and pulse rate have been all over the place. Patient states that her BP last week was 131/57 at 8:00 am, same day 146/65 at 6:00 pm, another day 141/62 at 7:30 am, 162/66 at 1:00 pm, 04/01/11 136/57 at 11:00 am, 04/02/11 122/60 pulse 76 at 8:15 am, today 110/59 pulse 87 at 9:00 am, 136/63 pulse 91 at 10:00 am. Pulse has been running 83-92 per patient. Please advise patient.

## 2011-04-03 NOTE — Telephone Encounter (Signed)
Continue the 2 times daily dosing (one in am , one in pm)-that may be working better , and this med does not generally affect heart rate - so pulse may be variable depending on what she is doing at that time Continue checking bp for another week - and try to check while at rest and relaxed  Then report back

## 2011-04-03 NOTE — Telephone Encounter (Signed)
Patient notified as instructed by telephone. 

## 2011-04-06 ENCOUNTER — Other Ambulatory Visit: Payer: Self-pay | Admitting: Family Medicine

## 2011-04-08 ENCOUNTER — Other Ambulatory Visit: Payer: Self-pay | Admitting: Family Medicine

## 2011-04-08 MED ORDER — AMLODIPINE BESYLATE 5 MG PO TABS: 5.0000 mg | ORAL_TABLET | Freq: Every day | ORAL | Status: DC

## 2011-04-08 MED ORDER — LOSARTAN POTASSIUM 100 MG PO TABS: 100.0000 mg | ORAL_TABLET | Freq: Every day | ORAL | Status: DC

## 2011-04-08 NOTE — Telephone Encounter (Signed)
Patient notified as instructed by telephone. 

## 2011-04-08 NOTE — Telephone Encounter (Signed)
I wanted her to increase cozaar from 50 to total dose of 100 each am  Will send in 100 mg pill with inst to take in am  Will refill electronically for both

## 2011-04-08 NOTE — Telephone Encounter (Signed)
Pt called, need refill on Amlodipine 5 mg  and Losavtan 50 mg. Says Dr. Milinda Antis increase this med to 50 mg twice daily. Says she is out and need Rx called in w/ the increase.  [facility] [address] [place], [place]

## 2011-04-09 ENCOUNTER — Ambulatory Visit: Payer: Medicare Other

## 2011-04-09 ENCOUNTER — Telehealth: Payer: Self-pay | Admitting: Family Medicine

## 2011-04-09 NOTE — Telephone Encounter (Signed)
Patient states that the drugstore wants her to get her refills through [facility] and that she does not want to get them through [facility].  She does not want to go through the mail order for her refills and is asking for assistance with her pharmacy on this.  Please call back.

## 2011-04-09 NOTE — Telephone Encounter (Signed)
Pt said she spoke with [facility] and got the problem resolved. Pt does not have to use [facility].

## 2011-04-11 ENCOUNTER — Ambulatory Visit (INDEPENDENT_AMBULATORY_CARE_PROVIDER_SITE_OTHER): Payer: Medicare Other | Admitting: Family Medicine

## 2011-04-11 ENCOUNTER — Encounter: Payer: Self-pay | Admitting: Family Medicine

## 2011-04-11 ENCOUNTER — Ambulatory Visit: Payer: Medicare Other

## 2011-04-11 DIAGNOSIS — R059 Cough, unspecified: Secondary | ICD-10-CM

## 2011-04-11 DIAGNOSIS — R05 Cough: Secondary | ICD-10-CM

## 2011-04-11 DIAGNOSIS — E538 Deficiency of other specified B group vitamins: Secondary | ICD-10-CM

## 2011-04-11 MED ORDER — CYANOCOBALAMIN 1000 MCG/ML IJ SOLN
1000.0000 ug | Freq: Once | INTRAMUSCULAR | Status: AC
  Administered 2011-04-11: 1000 ug via INTRAMUSCULAR

## 2011-04-11 NOTE — Patient Instructions (Signed)
B12 shot today. Sounds like you have a viral upper respiratory infection. Antibiotics are not needed for this.  Viral infections usually take 7-10 days to resolve.  The cough can last a couple weeks to go away. Take over the counter cough syrup like delsym or robitussin (make sure with pharmacist doesn't have decongestant in it). Push fluids and plenty of rest. Please let us know if you are not improving as expected, or if you have high fevers (>101.5) or difficulty swallowing or worsening productive cough. Call clinic with questions.  Good to see you today.

## 2011-04-11 NOTE — Assessment & Plan Note (Addendum)
Anticipate viral urti.  Supportive care. Crackles that clear - anticipate atelectasis. May use delsym otc and tylenol. Discussed reasons to update [place] to consider abx therapy. Tessalon didn't help in past.

## 2011-04-11 NOTE — Progress Notes (Signed)
Addended by: Josph Macho A on: 04/11/2011 10:43 AM   Modules accepted: Orders

## 2011-04-11 NOTE — Progress Notes (Signed)
  Subjective:    Patient ID: Anne-Marie Genson, female    DOB: [birth year], 78 y.o.   MRN: [id number]  HPI CC: feeling ill  2d h/o sinus congestion, cough with chest discomfort mildly productive, muffled hearing and full ears.  Fever 2d ago to 100.5.  Feeling very fatigued.  + HA, sinus congestion and PNdrainage, RN.    So far as tried aspirin.  Drinking plenty of water.  No abd pain, n/v, tooth pain, rashes, myalgia, arthralgia.  No ST.  Grandson and GF sick at home.  No smokers at home.  No h/o asthma, COPD.  + h/o allergic rhinitis.  Tessalon perls didn't help in past.  Review of Systems Per HPI    Objective:   Physical Exam  Nursing note and vitals reviewed. Constitutional: She appears well-developed and well-nourished. No distress.  HENT:  Head: Normocephalic and atraumatic.  Right Ear: Hearing, tympanic membrane, external ear and ear canal normal.  Left Ear: Hearing, tympanic membrane, external ear and ear canal normal.  Nose: No mucosal edema or rhinorrhea. Right sinus exhibits no maxillary sinus tenderness and no frontal sinus tenderness. Left sinus exhibits no maxillary sinus tenderness and no frontal sinus tenderness.  Mouth/Throat: Uvula is midline, oropharynx is clear and moist and mucous membranes are normal. No oropharyngeal exudate, posterior oropharyngeal edema, posterior oropharyngeal erythema or tonsillar abscesses.  Eyes: Conjunctivae and EOM are normal. Pupils are equal, round, and reactive to light. No scleral icterus.  Neck: Normal range of motion. Neck supple.  Cardiovascular: Normal rate, regular rhythm, normal heart sounds and intact distal pulses.   No murmur heard. Pulmonary/Chest: Effort normal and breath sounds normal. No respiratory distress. She has no wheezes. She has no rales.       Crackles bibasilarly but mostly clear with deep cough  Lymphadenopathy:    She has no cervical adenopathy.  Skin: Skin is warm and dry. No rash noted.       Assessment &  Plan:

## 2011-05-10 DIAGNOSIS — M7750 Other enthesopathy of unspecified foot: Secondary | ICD-10-CM | POA: Diagnosis not present

## 2011-05-10 DIAGNOSIS — G5760 Lesion of plantar nerve, unspecified lower limb: Secondary | ICD-10-CM | POA: Diagnosis not present

## 2011-07-11 ENCOUNTER — Ambulatory Visit (INDEPENDENT_AMBULATORY_CARE_PROVIDER_SITE_OTHER): Payer: Medicare Other | Admitting: *Deleted

## 2011-07-11 DIAGNOSIS — E538 Deficiency of other specified B group vitamins: Secondary | ICD-10-CM | POA: Diagnosis not present

## 2011-07-11 MED ORDER — CYANOCOBALAMIN 1000 MCG/ML IJ SOLN
1000.0000 ug | Freq: Once | INTRAMUSCULAR | Status: AC
  Administered 2011-07-11: 1000 ug via INTRAMUSCULAR

## 2011-08-13 DIAGNOSIS — L280 Lichen simplex chronicus: Secondary | ICD-10-CM | POA: Diagnosis not present

## 2011-08-14 ENCOUNTER — Ambulatory Visit (INDEPENDENT_AMBULATORY_CARE_PROVIDER_SITE_OTHER): Payer: Medicare Other | Admitting: Family Medicine

## 2011-08-14 ENCOUNTER — Encounter: Payer: Self-pay | Admitting: Family Medicine

## 2011-08-14 VITALS — BP 106/60 | HR 68 | Temp 97.6°F | Ht 62.0 in | Wt 139.2 lb

## 2011-08-14 DIAGNOSIS — J309 Allergic rhinitis, unspecified: Secondary | ICD-10-CM | POA: Diagnosis not present

## 2011-08-14 DIAGNOSIS — H6980 Other specified disorders of Eustachian tube, unspecified ear: Secondary | ICD-10-CM | POA: Insufficient documentation

## 2011-08-14 MED ORDER — FLUTICASONE PROPIONATE 50 MCG/ACT NA SUSP: 2.0000 | Freq: Every day | NASAL | Status: DC

## 2011-08-14 NOTE — Assessment & Plan Note (Signed)
Worse lately with spring season inst to continue flonase (refilled) Also change claritin to zyrtec 10 mg and update if not imp

## 2011-08-14 NOTE — Progress Notes (Signed)
Subjective:    Patient ID: Jenna Young, female    DOB: 07-13-1931, 78 y.o.   MRN: [id number]  HPI R hear hurts- comes and goes  No drainage  Does have post nasal drip  Going on for about a week  No fever- but feeling washed out  No uri   Using some ear drops -- ? For pain otc  Cannot hear well even with hearing aid   Allergies-sneezing and horseness No cough , but does clear her throat  Is using flonase and claritin  Patient Active Problem List  Diagnoses  . VITAMIN B12 DEFICIENCY  . UNSPECIFIED VITAMIN D DEFICIENCY  . HYPERLIPIDEMIA  . ANXIETY  . GRIEF REACTION  . VENOUS INSUFFICIENCY  . ALLERGIC RHINITIS  . GERD  . OSTEOPOROSIS  . HYPERTENSION NEC  . Gynecological examination  . Viral URI with cough  . Sore throat   Past Medical History  Diagnosis Date  . GERD (gastroesophageal reflux disease)   . Allergic rhinitis   . Hyperlipidemia   . Osteopenia   . Osteoporosis   . Anxiety   . Hypertension    Past Surgical History  Procedure Date  . Appendectomy   . Cesarean section   . Cholecystectomy   . Ovarian cyst surgery   . Fracture surgery 07/2008    neck  . Breast surgery 10/10    benign biopsy  negative   History  Substance Use Topics  . Smoking status: Former Smoker    Quit date: 03/11/1968  . Smokeless tobacco: Not on file  . Alcohol Use: No   Family History  Problem Relation Age of Onset  . Hypertension Mother   . Heart disease Father   . Macular degeneration Sister   . Diabetes Maternal Aunt   . Cancer Maternal Aunt     throat   Allergies  Allergen Reactions  . Buspirone Hcl     REACTION: HEART PALPITATIONS  . Codeine     REACTION: nausea and vomiting  . Hydrochlorothiazide     REACTION: syncope-possibly from dehydration  . Lansoprazole     REACTION: abd pain  . Minocycline Hcl   . Penicillins     REACTION: mouth numbness  . Ramipril     Cough   . Sulfa Antibiotics Nausea Only   Current Outpatient Prescriptions on File  Prior to Visit  Medication Sig Dispense Refill  . ALPRAZolam (XANAX) 0.5 MG tablet Take 1 tablet (0.5 mg total) by mouth daily as needed for anxiety.  30 tablet  0  . amLODipine (NORVASC) 5 MG tablet Take 1 tablet (5 mg total) by mouth daily.  30 tablet  11  . aspirin 325 MG tablet Take 325 mg by mouth as needed.        . calcium-vitamin D (OSCAL WITH D) 500-200 MG-UNIT per tablet Take 2 tablets by mouth daily.        . Cholecalciferol (VITAMIN D) 2000 UNITS CAPS Take 1 capsule by mouth daily.        . cyanocobalamin (,VITAMIN B-12,) 1000 MCG/ML injection Inject 1,000 mcg into the muscle. One injection every 3 months       . DimenhyDRINATE (DRAMAMINE) 50 MG CHEW Chew 1 tablet by mouth as needed.        . Famotidine (PEPCID PO) Take 1 tablet by mouth daily as needed.        . fluticasone (FLONASE) 50 MCG/ACT nasal spray Place 2 sprays into the nose daily.  16 g  11  .  loratadine (CLARITIN) 10 MG tablet Take 10 mg by mouth daily.        Marland Kitchen losartan (COZAAR) 100 MG tablet Take 1 tablet (100 mg total) by mouth daily.  30 tablet  11       Review of Systems Review of Systems  Constitutional: Negative for fever, appetite change,  and unexpected weight change.  Eyes: Negative for pain and visual disturbance.  ENT pos for runny/ stuffy nose / neg for ST or sinus pain  Respiratory: Negative for cough and shortness of breath.   Cardiovascular: Negative for cp or palpitations    Gastrointestinal: Negative for nausea, diarrhea and constipation.  Genitourinary: Negative for urgency and frequency.  Skin: Negative for pallor or rash   Neurological: Negative for weakness, light-headedness, numbness and headaches.  Hematological: Negative for adenopathy. Does not bruise/bleed easily.  Psychiatric/Behavioral: Negative for dysphoric mood. The patient is not nervous/anxious.          Objective:   Physical Exam  Constitutional: She appears well-developed and well-nourished. No distress.  HENT:  Head:  Normocephalic and atraumatic.  Mouth/Throat: Oropharynx is clear and moist. No oropharyngeal exudate.       Nares are injected and congested  No sinus tenderness  bilat TM effusions with dullness No erythema or bulging Throat clear - with some post nasal drip  Eyes: Conjunctivae and EOM are normal. Pupils are equal, round, and reactive to light. Right eye exhibits no discharge. Left eye exhibits no discharge.  Neck: Normal range of motion. Neck supple.  Cardiovascular: Normal rate, regular rhythm and normal heart sounds.   Pulmonary/Chest: Breath sounds normal. No respiratory distress. She has no wheezes.  Neurological: She is alert. No cranial nerve deficit.  Skin: Skin is warm and dry. No rash noted. No erythema. No pallor.  Psychiatric: She has a normal mood and affect.          Assessment & Plan:

## 2011-08-14 NOTE — Assessment & Plan Note (Signed)
In pt with prior hx of OM and ETD from allergies Will continue flonase Change antihist to zyrtec Use afrin for 2 days (disc rebound cong poss)  Update if worse or not improving

## 2011-08-14 NOTE — Patient Instructions (Signed)
I think you have ear pain from blockage of inner ear due to allergies (eustacian tube dysfunction) Stop claritin Start zyrtec otc 10 mg once daily (pills or liquid)  Also try afrin as directed for no more than 2 days  If no further improvement let me know  If worse or fever let me know- because this can turn into an infection

## 2011-10-02 DIAGNOSIS — H18419 Arcus senilis, unspecified eye: Secondary | ICD-10-CM | POA: Diagnosis not present

## 2011-10-02 DIAGNOSIS — H35049 Retinal micro-aneurysms, unspecified, unspecified eye: Secondary | ICD-10-CM | POA: Diagnosis not present

## 2011-10-02 DIAGNOSIS — H31009 Unspecified chorioretinal scars, unspecified eye: Secondary | ICD-10-CM | POA: Diagnosis not present

## 2011-10-02 DIAGNOSIS — H25099 Other age-related incipient cataract, unspecified eye: Secondary | ICD-10-CM | POA: Diagnosis not present

## 2011-10-11 ENCOUNTER — Ambulatory Visit (INDEPENDENT_AMBULATORY_CARE_PROVIDER_SITE_OTHER): Payer: Medicare Other

## 2011-10-11 DIAGNOSIS — E538 Deficiency of other specified B group vitamins: Secondary | ICD-10-CM | POA: Diagnosis not present

## 2011-10-11 MED ORDER — CYANOCOBALAMIN 1000 MCG/ML IJ SOLN
1000.0000 ug | Freq: Once | INTRAMUSCULAR | Status: AC
  Administered 2011-10-11: 1000 ug via INTRAMUSCULAR

## 2011-11-15 ENCOUNTER — Other Ambulatory Visit: Payer: Self-pay | Admitting: Family Medicine

## 2011-11-15 DIAGNOSIS — Z1231 Encounter for screening mammogram for malignant neoplasm of breast: Secondary | ICD-10-CM

## 2011-11-25 ENCOUNTER — Ambulatory Visit (INDEPENDENT_AMBULATORY_CARE_PROVIDER_SITE_OTHER): Payer: Medicare Other | Admitting: Family Medicine

## 2011-11-25 ENCOUNTER — Encounter: Payer: Self-pay | Admitting: Family Medicine

## 2011-11-25 VITALS — BP 140/58 | HR 82 | Temp 98.5°F | Ht 62.25 in | Wt 137.8 lb

## 2011-11-25 DIAGNOSIS — R42 Dizziness and giddiness: Secondary | ICD-10-CM

## 2011-11-25 DIAGNOSIS — H6980 Other specified disorders of Eustachian tube, unspecified ear: Secondary | ICD-10-CM | POA: Diagnosis not present

## 2011-11-25 DIAGNOSIS — H6990 Unspecified Eustachian tube disorder, unspecified ear: Secondary | ICD-10-CM

## 2011-11-25 DIAGNOSIS — J309 Allergic rhinitis, unspecified: Secondary | ICD-10-CM

## 2011-11-25 NOTE — Patient Instructions (Addendum)
I think you had vertigo related to allergies that create an ear problem  Let's try to prevent the ear problem by controlling allergies (with flonase and either claritin or zyrtec) You can decide whether you need flonase just during allergy season or all year  When you do get vertigo (dizziness) - dramamine is fine to take If worse - call and let me know  Update if not starting to improve in a week or if worsening

## 2011-11-25 NOTE — Assessment & Plan Note (Signed)
Intermittent when she has runny nose-resulting in vertigo  Will continue flonase - perhaps indefinitely If not imp or if ear pain or fever inst to call

## 2011-11-25 NOTE — Assessment & Plan Note (Signed)
BPV for one day -relieved by dramamine Suspect due to allergies and ETD  Will keep dramamine on hand and tx above problems more aggressively with antihist and flonase Update if not starting to improve in a week or if worsening

## 2011-11-25 NOTE — Progress Notes (Signed)
Subjective:    Patient ID: Jenna Young, female    DOB: [birth year], 78 y.o.   MRN: [id number]  HPI Here for ear symptoms and dizziness  Has hx of intermittent ETD  Friday- ear R did not feel right - occ fleeting pain/ fullness Back of throat is raw Then Saturday - started with vertigo- felt like the room was spinning -- took a dramamine and felt better pretty quickly  Then dizziness wore off  Went to church yesterday- felt generally fuzzy  Now lots of runny nose and sneezing No fever or facial pain   Went ahead and started flonase last night Takes claritin Could not find liquid zyrtec otc  Patient Active Problem List  Diagnosis  . VITAMIN B12 DEFICIENCY  . UNSPECIFIED VITAMIN D DEFICIENCY  . HYPERLIPIDEMIA  . ANXIETY  . GRIEF REACTION  . VENOUS INSUFFICIENCY  . ALLERGIC RHINITIS  . GERD  . OSTEOPOROSIS  . HYPERTENSION NEC  . Gynecological examination  . Viral URI with cough  . Sore throat  . ETD (eustachian tube dysfunction)   Past Medical History  Diagnosis Date  . GERD (gastroesophageal reflux disease)   . Allergic rhinitis   . Hyperlipidemia   . Osteopenia   . Osteoporosis   . Anxiety   . Hypertension    Past Surgical History  Procedure Date  . Appendectomy   . Cesarean section   . Cholecystectomy   . Ovarian cyst surgery   . Fracture surgery 07/2008    neck  . Breast surgery 10/10    benign biopsy  negative   History  Substance Use Topics  . Smoking status: Former Smoker    Quit date: 03/11/1968  . Smokeless tobacco: Not on file  . Alcohol Use: No   Family History  Problem Relation Age of Onset  . Hypertension Mother   . Heart disease Father   . Macular degeneration Sister   . Diabetes Maternal Aunt   . Cancer Maternal Aunt     throat   Allergies  Allergen Reactions  . Buspirone Hcl     REACTION: HEART PALPITATIONS  . Codeine     REACTION: nausea and vomiting  . Hydrochlorothiazide     REACTION: syncope-possibly from dehydration    . Lansoprazole     REACTION: abd pain  . Minocycline Hcl   . Penicillins     REACTION: mouth numbness  . Ramipril     Cough   . Sulfa Antibiotics Nausea Only   Current Outpatient Prescriptions on File Prior to Visit  Medication Sig Dispense Refill  . ALPRAZolam (XANAX) 0.5 MG tablet Take 1 tablet (0.5 mg total) by mouth daily as needed for anxiety.  30 tablet  0  . amLODipine (NORVASC) 5 MG tablet Take 1 tablet (5 mg total) by mouth daily.  30 tablet  11  . aspirin 325 MG tablet Take 325 mg by mouth as needed.        . calcium-vitamin D (OSCAL WITH D) 500-200 MG-UNIT per tablet Take 2 tablets by mouth daily.        . Cholecalciferol (VITAMIN D) 2000 UNITS CAPS Take 1 capsule by mouth daily.        . cyanocobalamin (,VITAMIN B-12,) 1000 MCG/ML injection Inject 1,000 mcg into the muscle. One injection every 3 months       . DimenhyDRINATE (DRAMAMINE) 50 MG CHEW Chew 1 tablet by mouth as needed.        . Famotidine (PEPCID PO) Take 1 tablet  by mouth daily as needed.        . fluticasone (FLONASE) 50 MCG/ACT nasal spray Place 2 sprays into the nose daily.  16 g  11  . loratadine (CLARITIN) 10 MG tablet Take 10 mg by mouth daily.        Marland Kitchen losartan (COZAAR) 100 MG tablet Take 1 tablet (100 mg total) by mouth daily.  30 tablet  11      Review of Systems Review of Systems  Constitutional: Negative for fever, appetite change, fatigue and unexpected weight change.  ENt pos for ear pain, cong/ rhinorrhea and sneezing, neg for st or sinus pain  Eyes: Negative for pain and visual disturbance.  Respiratory: Negative for cough and shortness of breath.  neg for wheezing  Cardiovascular: Negative for cp or palpitations    Gastrointestinal: Negative for nausea, diarrhea and constipation.  Genitourinary: Negative for urgency and frequency.  Skin: Negative for pallor or rash   Neurological: Negative for weakness, numbness and headaches. Neg for dizziness today (improved), neg for facial droop or  speech problem Hematological: Negative for adenopathy. Does not bruise/bleed easily.  Psychiatric/Behavioral: Negative for dysphoric mood. The patient is not nervous/anxious.         Objective:   Physical Exam  Constitutional: She is oriented to person, place, and time. She appears well-developed and well-nourished. No distress.  HENT:  Head: Normocephalic and atraumatic.  Mouth/Throat: Oropharynx is clear and moist. No oropharyngeal exudate.       Nares are injected and congested   No facial tenderness , no temporal tenderness TMs are dull with small effusions and no erythema  Throat clear post nasal drip   Eyes: Conjunctivae normal and EOM are normal. Pupils are equal, round, and reactive to light. Right eye exhibits no discharge. Left eye exhibits no discharge. No scleral icterus.       No nystagmus today  Neck: Normal range of motion. Neck supple. No JVD present. Carotid bruit is not present. No thyromegaly present.  Cardiovascular: Normal rate, regular rhythm and normal heart sounds.   Pulmonary/Chest: Effort normal and breath sounds normal. No respiratory distress. She has no wheezes.  Musculoskeletal: She exhibits no edema.  Lymphadenopathy:    She has no cervical adenopathy.  Neurological: She is alert and oriented to person, place, and time. She has normal reflexes. She displays no atrophy and no tremor. No cranial nerve deficit or sensory deficit. She exhibits normal muscle tone. She displays a negative Romberg sign. Coordination and gait normal.       Regular and tandem gait are normal today  Skin: Skin is warm and dry. No rash noted. No erythema. No pallor.  Psychiatric: She has a normal mood and affect.          Assessment & Plan:

## 2011-11-25 NOTE — Assessment & Plan Note (Signed)
With ETD and vertigo along with inc sneezing/ rhinorrhea- I adv continuing the flonase through allergy season- or all year  Also continue antihistamine Update if not starting to improve in a week or if worsening

## 2011-12-23 ENCOUNTER — Ambulatory Visit: Payer: Medicare (Managed Care)

## 2011-12-23 ENCOUNTER — Ambulatory Visit
Admission: RE | Admit: 2011-12-23 | Discharge: 2011-12-23 | Disposition: A | Discharge status: Home / Self Care | Payer: Medicare Other | Source: Ambulatory Visit | Attending: Family Medicine | Admitting: Family Medicine

## 2011-12-23 DIAGNOSIS — Z1231 Encounter for screening mammogram for malignant neoplasm of breast: Secondary | ICD-10-CM | POA: Diagnosis not present

## 2011-12-24 ENCOUNTER — Encounter: Payer: Self-pay | Admitting: *Deleted

## 2012-01-06 ENCOUNTER — Telehealth: Payer: Self-pay | Admitting: Family Medicine

## 2012-01-06 DIAGNOSIS — E559 Vitamin D deficiency, unspecified: Secondary | ICD-10-CM

## 2012-01-06 DIAGNOSIS — M810 Age-related osteoporosis without current pathological fracture: Secondary | ICD-10-CM

## 2012-01-06 DIAGNOSIS — I10 Essential (primary) hypertension: Secondary | ICD-10-CM

## 2012-01-06 DIAGNOSIS — K219 Gastro-esophageal reflux disease without esophagitis: Secondary | ICD-10-CM

## 2012-01-06 DIAGNOSIS — E538 Deficiency of other specified B group vitamins: Secondary | ICD-10-CM

## 2012-01-06 DIAGNOSIS — E785 Hyperlipidemia, unspecified: Secondary | ICD-10-CM

## 2012-01-06 DIAGNOSIS — IMO0002 Reserved for concepts with insufficient information to code with codable children: Secondary | ICD-10-CM

## 2012-01-06 NOTE — Telephone Encounter (Signed)
Message copied by Judy Pimple on Mon Jan 06, 2012  7:49 AM ------      Message from: Alvina Chou      Created: Wed Jan 01, 2012  4:19 PM      Regarding: Lab orders for Friday Nov. 1. 2013       Patient is scheduled for CPX labs, please order future labs, Thanks , Camelia Eng

## 2012-01-10 ENCOUNTER — Other Ambulatory Visit (INDEPENDENT_AMBULATORY_CARE_PROVIDER_SITE_OTHER): Payer: Medicare Other

## 2012-01-10 DIAGNOSIS — IMO0002 Reserved for concepts with insufficient information to code with codable children: Secondary | ICD-10-CM | POA: Diagnosis not present

## 2012-01-10 DIAGNOSIS — K219 Gastro-esophageal reflux disease without esophagitis: Secondary | ICD-10-CM

## 2012-01-10 DIAGNOSIS — I10 Essential (primary) hypertension: Secondary | ICD-10-CM

## 2012-01-10 DIAGNOSIS — E559 Vitamin D deficiency, unspecified: Secondary | ICD-10-CM

## 2012-01-10 DIAGNOSIS — E785 Hyperlipidemia, unspecified: Secondary | ICD-10-CM

## 2012-01-10 DIAGNOSIS — M810 Age-related osteoporosis without current pathological fracture: Secondary | ICD-10-CM

## 2012-01-10 DIAGNOSIS — E538 Deficiency of other specified B group vitamins: Secondary | ICD-10-CM

## 2012-01-10 LAB — CBC WITH DIFFERENTIAL/PLATELET
Basophils Absolute: 0 10*3/uL (ref 0.0–0.1)
Basophils Relative: 0.5 % (ref 0.0–3.0)
Eosinophils Absolute: 0.2 10*3/uL (ref 0.0–0.7)
Eosinophils Relative: 2.8 % (ref 0.0–5.0)
HCT: 31.8 % — ABNORMAL LOW (ref 36.0–46.0)
Hemoglobin: 10.5 g/dL — ABNORMAL LOW (ref 12.0–15.0)
Lymphocytes Relative: 37.4 % (ref 12.0–46.0)
Lymphs Abs: 2.1 10*3/uL (ref 0.7–4.0)
MCHC: 33.1 g/dL (ref 30.0–36.0)
MCV: 83.2 fl (ref 78.0–100.0)
Monocytes Absolute: 0.5 10*3/uL (ref 0.1–1.0)
Monocytes Relative: 9.1 % (ref 3.0–12.0)
Neutro Abs: 2.8 10*3/uL (ref 1.4–7.7)
Neutrophils Relative %: 50.2 % (ref 43.0–77.0)
Platelets: 243 10*3/uL (ref 150.0–400.0)
RBC: 3.82 Mil/uL — ABNORMAL LOW (ref 3.87–5.11)
RDW: 16.4 % — ABNORMAL HIGH (ref 11.5–14.6)
WBC: 5.6 10*3/uL (ref 4.5–10.5)

## 2012-01-10 LAB — LIPID PANEL
Cholesterol: 194 mg/dL (ref 0–200)
HDL: 56.8 mg/dL (ref >39.00)
LDL Cholesterol: 119 mg/dL — ABNORMAL HIGH (ref 0–99)
Total CHOL/HDL Ratio: 3
Triglycerides: 93 mg/dL (ref 0.0–149.0)
VLDL: 18.6 mg/dL (ref 0.0–40.0)

## 2012-01-10 LAB — COMPREHENSIVE METABOLIC PANEL
ALT: 15 U/L (ref 0–35)
AST: 18 U/L (ref 0–37)
Albumin: 3.8 g/dL (ref 3.5–5.2)
Alkaline Phosphatase: 71 U/L (ref 39–117)
BUN: 13 mg/dL (ref 6–23)
CO2: 25 mEq/L (ref 19–32)
Calcium: 9.2 mg/dL (ref 8.4–10.5)
Chloride: 103 mEq/L (ref 96–112)
Creatinine, Ser: 0.9 mg/dL (ref 0.4–1.2)
GFR: 63.55 mL/min (ref >60.00)
Glucose, Bld: 91 mg/dL (ref 70–99)
Potassium: 4.1 mEq/L (ref 3.5–5.1)
Sodium: 136 mEq/L (ref 135–145)
Total Bilirubin: 0.4 mg/dL (ref 0.3–1.2)
Total Protein: 8 g/dL (ref 6.0–8.3)

## 2012-01-10 LAB — TSH: TSH: 2 u[IU]/mL (ref 0.35–5.50)

## 2012-01-10 LAB — VITAMIN B12: Vitamin B-12: 231 pg/mL (ref 211–911)

## 2012-01-10 NOTE — Addendum Note (Signed)
Addended by: Alvina Chou on: 01/10/2012 10:56 AM   Modules accepted: Orders

## 2012-01-11 LAB — VITAMIN D 25 HYDROXY (VIT D DEFICIENCY, FRACTURES): Vit D, 25-Hydroxy: 48 ng/mL (ref 30–89)

## 2012-01-17 ENCOUNTER — Ambulatory Visit (INDEPENDENT_AMBULATORY_CARE_PROVIDER_SITE_OTHER): Payer: Medicare Other | Admitting: Family Medicine

## 2012-01-17 ENCOUNTER — Encounter: Payer: Self-pay | Admitting: Family Medicine

## 2012-01-17 VITALS — BP 132/58 | HR 68 | Temp 98.2°F | Ht 61.75 in | Wt 138.5 lb

## 2012-01-17 DIAGNOSIS — D649 Anemia, unspecified: Secondary | ICD-10-CM | POA: Diagnosis not present

## 2012-01-17 DIAGNOSIS — Z23 Encounter for immunization: Secondary | ICD-10-CM

## 2012-01-17 DIAGNOSIS — E538 Deficiency of other specified B group vitamins: Secondary | ICD-10-CM | POA: Diagnosis not present

## 2012-01-17 DIAGNOSIS — E785 Hyperlipidemia, unspecified: Secondary | ICD-10-CM

## 2012-01-17 DIAGNOSIS — IMO0002 Reserved for concepts with insufficient information to code with codable children: Secondary | ICD-10-CM | POA: Diagnosis not present

## 2012-01-17 DIAGNOSIS — Z1211 Encounter for screening for malignant neoplasm of colon: Secondary | ICD-10-CM | POA: Diagnosis not present

## 2012-01-17 DIAGNOSIS — E559 Vitamin D deficiency, unspecified: Secondary | ICD-10-CM

## 2012-01-17 MED ORDER — AMLODIPINE BESYLATE 5 MG PO TABS: 5.0000 mg | ORAL_TABLET | Freq: Every day | ORAL | Status: DC

## 2012-01-17 MED ORDER — CYANOCOBALAMIN 1000 MCG/ML IJ SOLN
1000.0000 ug | Freq: Once | INTRAMUSCULAR | Status: AC
  Administered 2012-01-17: 1000 ug via INTRAMUSCULAR

## 2012-01-17 MED ORDER — LOSARTAN POTASSIUM 100 MG PO TABS: 100.0000 mg | ORAL_TABLET | Freq: Every day | ORAL | Status: DC

## 2012-01-17 NOTE — Assessment & Plan Note (Signed)
Fair diet control Disc goals for lipids and reasons to control them Rev labs with pt Rev low sat fat diet in detail

## 2012-01-17 NOTE — Patient Instructions (Addendum)
Flu shot today B12 shot today  Please do stool card and then after you do it - get some iron (ferrous sulfate 325 mg over the counter) and take one daily - if this constipates you use a stool softener Increase your B12 injections to every 2 months now  Follow up with me in 6 months  Stay as active as you can

## 2012-01-17 NOTE — Assessment & Plan Note (Signed)
Pt declines colonosc  Will do ifob Is intermittently anemic -has been all her life

## 2012-01-17 NOTE — Assessment & Plan Note (Signed)
B12 level is low nl  Will inc inj to every 2 mo  inj today

## 2012-01-17 NOTE — Assessment & Plan Note (Signed)
bp in fair control at this time  No changes needed  Disc lifstyle change with low sodium diet and exercise   Rev labs today 

## 2012-01-17 NOTE — Progress Notes (Signed)
Subjective:    Patient ID: Jenna Young, female    DOB: 1931/05/19, 78 y.o.   MRN: [id number]  HPI Here for check up of chronic medical conditions and to review health mt list   Doing well overall -nothing new medically   Wt is stable with bmi of 25  Lipids- diet controlled Lab Results  Component Value Date   CHOL 194 01/10/2012   CHOL 209* 12/04/2010   CHOL 209* 04/23/2010   Lab Results  Component Value Date   HDL 56.80 01/10/2012   HDL 36.64 12/04/2010   HDL 40.34 04/23/2010   Lab Results  Component Value Date   LDLCALC 119* 01/10/2012   LDLCALC  Value: 123        Total Cholesterol/HDL:CHD Risk Coronary Heart Disease Risk Table                     Men   Women  1/2 Average Risk   3.4   3.3  Average Risk       5.0   4.4  2 X Average Risk   9.6   7.1  3 X Average Risk  23.4   11.0        Use the calculated Patient Ratio above and the CHD Risk Table to determine the patient's CHD Risk.        ATP III CLASSIFICATION (LDL):  <100     mg/dL   Optimal  742-595  mg/dL   Near or Above                    Optimal  130-159  mg/dL   Borderline  638-756  mg/dL   High  >433     mg/dL   Very High* 04/19/5186   Lab Results  Component Value Date   TRIG 93.0 01/10/2012   TRIG 147.0 12/04/2010   TRIG 106.0 04/23/2010   Lab Results  Component Value Date   CHOLHDL 3 01/10/2012   CHOLHDL 3 12/04/2010   CHOLHDL 3 04/23/2010   Lab Results  Component Value Date   LDLDIRECT 114.5 12/04/2010   LDLDIRECT 117.1 04/23/2010   LDLDIRECT 129.8 03/24/2008   is pretty good with diet -no red meat and very seldom eats fried foods   OP- has been 5 or more years ago  Does not want to get any further dexas  Last dexa Vit d is 48- good  No broken bones No falls whatsoever   B12 Lab Results  Component Value Date   VITAMINB12 231 01/10/2012   takes shot every 3 mo  Supposed to get one today  Feels better if level is higher   Colon cancer screen-never had one and is not interested in colon cancer screening    Zoster status- never had shingles vaccine  May be interested    Flu shot - will get that today  mammo 10/13 Self exam - no lumps or changes   Was anemic in labs  Lab Results  Component Value Date   WBC 5.6 01/10/2012   HGB 10.5* 01/10/2012   HCT 31.8* 01/10/2012   MCV 83.2 01/10/2012   PLT 243.0 01/10/2012   will do stool cards again - nothing ever shows up  Her hb goes up and down over time  Her diet is not optimal She does not want an aggressive work up for this   Patient Active Problem List  Diagnosis  . VITAMIN B12 DEFICIENCY  . UNSPECIFIED VITAMIN D DEFICIENCY  .  HYPERLIPIDEMIA  . ANXIETY  . GRIEF REACTION  . VENOUS INSUFFICIENCY  . ALLERGIC RHINITIS  . GERD  . OSTEOPOROSIS  . HYPERTENSION NEC  . Gynecological examination  . Viral URI with cough  . Sore throat  . ETD (eustachian tube dysfunction)  . Vertigo   Past Medical History  Diagnosis Date  . GERD (gastroesophageal reflux disease)   . Allergic rhinitis   . Hyperlipidemia   . Osteopenia   . Osteoporosis   . Anxiety   . Hypertension    Past Surgical History  Procedure Date  . Appendectomy   . Cesarean section   . Cholecystectomy   . Ovarian cyst surgery   . Fracture surgery 07/2008    neck  . Breast surgery 10/10    benign biopsy  negative   History  Substance Use Topics  . Smoking status: Former Smoker    Quit date: 03/11/1968  . Smokeless tobacco: Not on file  . Alcohol Use: No   Family History  Problem Relation Age of Onset  . Hypertension Mother   . Heart disease Father   . Macular degeneration Sister   . Diabetes Maternal Aunt   . Cancer Maternal Aunt     throat   Allergies  Allergen Reactions  . Buspirone Hcl     REACTION: HEART PALPITATIONS  . Codeine     REACTION: nausea and vomiting  . Hydrochlorothiazide     REACTION: syncope-possibly from dehydration  . Lansoprazole     REACTION: abd pain  . Minocycline Hcl   . Penicillins     REACTION: mouth numbness  .  Ramipril     Cough   . Sulfa Antibiotics Nausea Only   Current Outpatient Prescriptions on File Prior to Visit  Medication Sig Dispense Refill  . amLODipine (NORVASC) 5 MG tablet Take 1 tablet (5 mg total) by mouth daily.  30 tablet  11  . aspirin 325 MG tablet Take 325 mg by mouth as needed.        . calcium-vitamin D (OSCAL WITH D) 500-200 MG-UNIT per tablet Take 2 tablets by mouth daily.        . Cholecalciferol (VITAMIN D) 2000 UNITS CAPS Take 1 capsule by mouth daily.        . cyanocobalamin (,VITAMIN B-12,) 1000 MCG/ML injection Inject 1,000 mcg into the muscle. One injection every 3 months       . DimenhyDRINATE (DRAMAMINE) 50 MG CHEW Chew 1 tablet by mouth as needed.        . Famotidine (PEPCID PO) Take 1 tablet by mouth daily as needed.        . fluticasone (FLONASE) 50 MCG/ACT nasal spray Place 2 sprays into the nose daily.  16 g  11  . loratadine (CLARITIN) 10 MG tablet Take 10 mg by mouth daily.        Marland Kitchen losartan (COZAAR) 100 MG tablet Take 1 tablet (100 mg total) by mouth daily.  30 tablet  11        Review of Systems Review of Systems  Constitutional: Negative for fever, appetite change, fatigue and unexpected weight change.  Eyes: Negative for pain and visual disturbance.  Respiratory: Negative for cough and shortness of breath.   Cardiovascular: Negative for cp or palpitations    Gastrointestinal: Negative for nausea, diarrhea and constipation.  Genitourinary: Negative for urgency and frequency.  Skin: Negative for pallor or rash   Neurological: Negative for weakness, light-headedness, numbness and headaches.  Hematological: Negative for  adenopathy. Does not bruise/bleed easily.  Psychiatric/Behavioral: Negative for dysphoric mood. The patient is not nervous/anxious.         Objective:   Physical Exam  Constitutional: She appears well-developed and well-nourished. No distress.  HENT:  Head: Normocephalic and atraumatic.  Mouth/Throat: Oropharynx is clear and  moist.  Eyes: Conjunctivae normal and EOM are normal. Pupils are equal, round, and reactive to light. Right eye exhibits no discharge. Left eye exhibits no discharge. No scleral icterus.  Neck: Normal range of motion. Neck supple. No JVD present. Carotid bruit is not present. No thyromegaly present.  Cardiovascular: Normal rate, regular rhythm and normal heart sounds.   Pulmonary/Chest: Effort normal and breath sounds normal. No respiratory distress. She has no wheezes.  Abdominal: Soft. Bowel sounds are normal. She exhibits no distension, no abdominal bruit and no mass. There is no tenderness.  Musculoskeletal: She exhibits no edema.  Lymphadenopathy:    She has no cervical adenopathy.  Neurological: She is alert. She has normal reflexes. No cranial nerve deficit. She exhibits normal muscle tone. Coordination normal.  Skin: Skin is warm and dry. No rash noted. No erythema. No pallor.  Psychiatric: She has a normal mood and affect.          Assessment & Plan:

## 2012-01-17 NOTE — Assessment & Plan Note (Signed)
D level is ok  Pt declines further dexas

## 2012-01-17 NOTE — Assessment & Plan Note (Signed)
This comes and goes- per pt all her life  Lab Results  Component Value Date   WBC 5.6 01/10/2012   HGB 10.5* 01/10/2012   HCT 31.8* 01/10/2012   MCV 83.2 01/10/2012   PLT 243.0 01/10/2012    Will do ifob - declines endoscopy inst to start 325 ferrous sulfate daily after doing ifob -has helped in past Disc iron in diet also

## 2012-01-30 ENCOUNTER — Other Ambulatory Visit (INDEPENDENT_AMBULATORY_CARE_PROVIDER_SITE_OTHER): Payer: Medicare Other

## 2012-01-30 DIAGNOSIS — Z1211 Encounter for screening for malignant neoplasm of colon: Secondary | ICD-10-CM | POA: Diagnosis not present

## 2012-01-30 LAB — FECAL OCCULT BLOOD, IMMUNOCHEMICAL: Fecal Occult Bld: NEGATIVE

## 2012-03-15 DIAGNOSIS — H9209 Otalgia, unspecified ear: Secondary | ICD-10-CM | POA: Diagnosis not present

## 2012-03-15 DIAGNOSIS — R55 Syncope and collapse: Secondary | ICD-10-CM | POA: Diagnosis not present

## 2012-03-16 ENCOUNTER — Encounter: Payer: Self-pay | Admitting: Family Medicine

## 2012-03-16 ENCOUNTER — Ambulatory Visit (INDEPENDENT_AMBULATORY_CARE_PROVIDER_SITE_OTHER): Payer: Medicare Other | Admitting: Family Medicine

## 2012-03-16 VITALS — BP 140/68 | HR 79 | Temp 97.8°F | Ht 61.75 in | Wt 137.5 lb

## 2012-03-16 DIAGNOSIS — H9209 Otalgia, unspecified ear: Secondary | ICD-10-CM

## 2012-03-16 DIAGNOSIS — E538 Deficiency of other specified B group vitamins: Secondary | ICD-10-CM | POA: Diagnosis not present

## 2012-03-16 DIAGNOSIS — G8929 Other chronic pain: Secondary | ICD-10-CM | POA: Insufficient documentation

## 2012-03-16 DIAGNOSIS — H6980 Other specified disorders of Eustachian tube, unspecified ear: Secondary | ICD-10-CM

## 2012-03-16 DIAGNOSIS — H9201 Otalgia, right ear: Secondary | ICD-10-CM

## 2012-03-16 MED ORDER — CYANOCOBALAMIN 1000 MCG/ML IJ SOLN
1000.0000 ug | Freq: Once | INTRAMUSCULAR | Status: AC
  Administered 2012-03-16: 1000 ug via INTRAMUSCULAR

## 2012-03-16 NOTE — Assessment & Plan Note (Signed)
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

## 2012-03-16 NOTE — Patient Instructions (Addendum)
Continue flonase Continue and finish the antibiotic  We will do referral to ENT at check out B12 shot today  If symptoms worsen in the meantime- please let me know

## 2012-03-16 NOTE — Progress Notes (Signed)
Subjective:    Patient ID: Jenna Young, female    DOB: 1931/03/19, 79 y.o.   MRN: [id number]  HPI Here with ear problems   Last Thursday she eating bkfast - stood up and then got really dizzy - worried she would faint and then sat down until she felt ok  Then laid down and stayed in bed all day  Felt like the room was spinning   Yesterday went to urgent care - they wondered if she may have had an orthostatic bp change R ear hurts again -- got better after last visit- hurts all the way down to her neck  Worried about the vertigo   She was given cefdinir at the [facility]- unsure if infection  Uses flonase every day  Still feels congested all the time   Had lots of ear infections as a child and her ear drum ruptured a lot  Always the right ear   Patient Active Problem List  Diagnosis  . VITAMIN B12 DEFICIENCY  . UNSPECIFIED VITAMIN D DEFICIENCY  . HYPERLIPIDEMIA  . ANXIETY  . GRIEF REACTION  . VENOUS INSUFFICIENCY  . ALLERGIC RHINITIS  . GERD  . OSTEOPOROSIS  . HYPERTENSION NEC  . Gynecological examination  . Viral URI with cough  . Sore throat  . ETD (eustachian tube dysfunction)  . Vertigo  . Colon cancer screening  . Anemia   Past Medical History  Diagnosis Date  . GERD (gastroesophageal reflux disease)   . Allergic rhinitis   . Hyperlipidemia   . Osteopenia   . Osteoporosis   . Anxiety   . Hypertension    Past Surgical History  Procedure Date  . Appendectomy   . Cesarean section   . Cholecystectomy   . Ovarian cyst surgery   . Fracture surgery 07/2008    neck  . Breast surgery 10/10    benign biopsy  negative   History  Substance Use Topics  . Smoking status: Former Smoker    Quit date: 03/11/1968  . Smokeless tobacco: Not on file  . Alcohol Use: No   Family History  Problem Relation Age of Onset  . Hypertension Mother   . Heart disease Father   . Macular degeneration Sister   . Diabetes Maternal Aunt   . Cancer Maternal Aunt     throat    Allergies  Allergen Reactions  . Buspirone Hcl     REACTION: HEART PALPITATIONS  . Codeine     REACTION: nausea and vomiting  . Hydrochlorothiazide     REACTION: syncope-possibly from dehydration  . Lansoprazole     REACTION: abd pain  . Minocycline Hcl   . Penicillins     REACTION: mouth numbness  . Ramipril     Cough   . Sulfa Antibiotics Nausea Only   Current Outpatient Prescriptions on File Prior to Visit  Medication Sig Dispense Refill  . amLODipine (NORVASC) 5 MG tablet Take 1 tablet (5 mg total) by mouth daily.  30 tablet  11  . aspirin 325 MG tablet Take 325 mg by mouth as needed.        . calcium-vitamin D (OSCAL WITH D) 500-200 MG-UNIT per tablet Take 2 tablets by mouth daily.        . Cholecalciferol (VITAMIN D) 2000 UNITS CAPS Take 1 capsule by mouth daily.        . cyanocobalamin (,VITAMIN B-12,) 1000 MCG/ML injection Inject 1,000 mcg into the muscle. One injection every 2 months      .  DimenhyDRINATE (DRAMAMINE) 50 MG CHEW Chew 1 tablet by mouth as needed.        . Famotidine (PEPCID PO) Take 1 tablet by mouth daily as needed.        . fluticasone (FLONASE) 50 MCG/ACT nasal spray Place 2 sprays into the nose daily.  16 g  11  . loratadine (CLARITIN) 10 MG tablet Take 10 mg by mouth daily.        Marland Kitchen losartan (COZAAR) 100 MG tablet Take 1 tablet (100 mg total) by mouth daily.  30 tablet  11       Review of Systems Review of Systems  Constitutional: Negative for fever, appetite change, fatigue and unexpected weight change.  ENT pos for congestion/ ear pain / neg for ear drainage Eyes: Negative for pain and visual disturbance.  Respiratory: Negative for cough and shortness of breath.   Cardiovascular: Negative for cp or palpitations    Gastrointestinal: Negative for nausea, diarrhea and constipation.  Genitourinary: Negative for urgency and frequency.  Skin: Negative for pallor or rash   Neurological: Negative for weakness, light-headedness, numbness and  headaches. pos for vertigo that is better now Hematological: Negative for adenopathy. Does not bruise/bleed easily.  Psychiatric/Behavioral: Negative for dysphoric mood. The patient is not nervous/anxious.         Objective:   Physical Exam  Constitutional: She appears well-developed and well-nourished. No distress.  HENT:  Head: Normocephalic and atraumatic.  Mouth/Throat: Oropharynx is clear and moist. No oropharyngeal exudate.       Nares are boggy No sinus tenderness Tms are dull bilat-no erythema or bulging   Eyes: Conjunctivae normal and EOM are normal. Pupils are equal, round, and reactive to light. Right eye exhibits no discharge. Left eye exhibits no discharge. No scleral icterus.       No nystagmus   Neck: Normal range of motion. Neck supple. No JVD present.  Cardiovascular: Normal rate and regular rhythm.   Pulmonary/Chest: Effort normal and breath sounds normal. No respiratory distress. She has no wheezes.  Musculoskeletal: She exhibits no edema.  Lymphadenopathy:    She has no cervical adenopathy.  Neurological: She is alert. She has normal reflexes. No cranial nerve deficit. She exhibits normal muscle tone. Coordination normal.       No focal cerebellar signs  Skin: Skin is warm and dry. No rash noted. No erythema. No pallor.  Psychiatric: She has a normal mood and affect.          Assessment & Plan:

## 2012-03-16 NOTE — Assessment & Plan Note (Signed)
Chronic in R ear with hx of frequent infx ? If may need further eval or myringotomy tubes  Will ref to ENT Will continue flonase

## 2012-03-18 ENCOUNTER — Ambulatory Visit: Payer: Medicare Other

## 2012-03-18 DIAGNOSIS — H6120 Impacted cerumen, unspecified ear: Secondary | ICD-10-CM | POA: Diagnosis not present

## 2012-03-18 DIAGNOSIS — H9209 Otalgia, unspecified ear: Secondary | ICD-10-CM | POA: Diagnosis not present

## 2012-03-18 DIAGNOSIS — M2669 Other specified disorders of temporomandibular joint: Secondary | ICD-10-CM | POA: Diagnosis not present

## 2012-04-01 ENCOUNTER — Telehealth: Payer: Self-pay | Admitting: Family Medicine

## 2012-04-01 ENCOUNTER — Ambulatory Visit (INDEPENDENT_AMBULATORY_CARE_PROVIDER_SITE_OTHER): Payer: Medicare Other | Admitting: Family Medicine

## 2012-04-01 ENCOUNTER — Encounter: Payer: Self-pay | Admitting: Family Medicine

## 2012-04-01 VITALS — BP 144/60 | HR 60 | Temp 98.2°F | Wt 138.0 lb

## 2012-04-01 DIAGNOSIS — L293 Anogenital pruritus, unspecified: Secondary | ICD-10-CM | POA: Diagnosis not present

## 2012-04-01 DIAGNOSIS — N898 Other specified noninflammatory disorders of vagina: Secondary | ICD-10-CM | POA: Insufficient documentation

## 2012-04-01 MED ORDER — TERCONAZOLE 0.8 % VA CREA: TOPICAL_CREAM | VAGINAL | Status: DC

## 2012-04-01 MED ORDER — FLUCONAZOLE 150 MG PO TABS: 150.0000 mg | ORAL_TABLET | Freq: Once | ORAL | Status: DC

## 2012-04-01 NOTE — Assessment & Plan Note (Signed)
With yeast hyphae on wet prep tx with diflucan and terazol cream Given handout  Update if not starting to improve in a week or if worsening

## 2012-04-01 NOTE — Telephone Encounter (Signed)
I will see her then  

## 2012-04-01 NOTE — Telephone Encounter (Signed)
Patient Information:  Caller Name: Sahily  Phone: [phone]  Patient: Jenna, Young  Gender: Female  DOB: May 02, 1931  Age: 79 Years  PCP: Roxy Manns [facility])  Office Follow Up:  Does the office need to follow up with this patient?: No  Instructions For The Office: N/A  RN Note:  Wears peripad intrmittently for urinary leakage.  Took Cefdinir BID for 10 days for ear pain 03/15/12 prescribed by Urgent Care. No vaginal discharge.  Symptoms  Reason For Call & Symptoms: External vaginal itching for 1 week  Reviewed Health History In EMR: Yes  Reviewed Medications In EMR: Yes  Reviewed Allergies In EMR: Yes  Reviewed Surgeries / Procedures: Yes  Date of Onset of Symptoms: 03/25/2012  Treatments Tried: Vagisil, Vaseline  Treatments Tried Worked: No  Guideline(s) Used:  Vulvar Symptoms  Disposition Per Guideline:   See Today or Tomorrow in Office  Reason For Disposition Reached:   Vulvar itching and not improved > 3 days following Care Advice  Advice Given:   Genital Hygiene:  Keep your genital area clean. Wash daily.  Keep your genital area dry. Wear cotton underwear or underwear with a cotton crotch.  Expected Course:  If there is no improvement within 3 days, then you will need to be examined.  Call Back If:  Any rash lasts longer than 24 hours  Fever occurs  Yellow or green vaginal discharge occurs  No improvement in "yeast infection" within 3 days  Appointment Scheduled:  04/01/2012 16:00:00 Appointment Scheduled Provider:  Roxy Manns [facility])

## 2012-04-01 NOTE — Progress Notes (Signed)
Subjective:    Patient ID: Jenna Young, female    DOB: 11/23/1931, 79 y.o.   MRN: [id number]  HPI Here with vulvar/ vagina - mostly external  No vaginal discharge  She was recently on antibiotic from urgent care   Last yeast infx was years ago    Got some vagasil -no imp  Used some vaseline today  She does wear a pad for incontinence and support hose    No cramping or pelvic pain at all  No fever or other symptoms   Patient Active Problem List  Diagnosis  . VITAMIN B12 DEFICIENCY  . UNSPECIFIED VITAMIN D DEFICIENCY  . HYPERLIPIDEMIA  . ANXIETY  . GRIEF REACTION  . VENOUS INSUFFICIENCY  . ALLERGIC RHINITIS  . GERD  . OSTEOPOROSIS  . HYPERTENSION NEC  . Gynecological examination  . Viral URI with cough  . Sore throat  . ETD (eustachian tube dysfunction)  . Vertigo  . Colon cancer screening  . Anemia  . Chronic right ear pain   Past Medical History  Diagnosis Date  . GERD (gastroesophageal reflux disease)   . Allergic rhinitis   . Hyperlipidemia   . Osteopenia   . Osteoporosis   . Anxiety   . Hypertension    Past Surgical History  Procedure Date  . Appendectomy   . Cesarean section   . Cholecystectomy   . Ovarian cyst surgery   . Fracture surgery 07/2008    neck  . Breast surgery 10/10    benign biopsy  negative   History  Substance Use Topics  . Smoking status: Former Smoker    Quit date: 03/11/1968  . Smokeless tobacco: Not on file  . Alcohol Use: No   Family History  Problem Relation Age of Onset  . Hypertension Mother   . Heart disease Father   . Macular degeneration Sister   . Diabetes Maternal Aunt   . Cancer Maternal Aunt     throat   Allergies  Allergen Reactions  . Buspirone Hcl     REACTION: HEART PALPITATIONS  . Codeine     REACTION: nausea and vomiting  . Hydrochlorothiazide     REACTION: syncope-possibly from dehydration  . Lansoprazole     REACTION: abd pain  . Minocycline Hcl   . Penicillins     REACTION: mouth  numbness  . Ramipril     Cough   . Sulfa Antibiotics Nausea Only   Current Outpatient Prescriptions on File Prior to Visit  Medication Sig Dispense Refill  . amLODipine (NORVASC) 5 MG tablet Take 1 tablet (5 mg total) by mouth daily.  30 tablet  11  . aspirin 325 MG tablet Take 325 mg by mouth as needed.        . calcium-vitamin D (OSCAL WITH D) 500-200 MG-UNIT per tablet Take 2 tablets by mouth daily.        . Cholecalciferol (VITAMIN D) 2000 UNITS CAPS Take 1 capsule by mouth daily.        . cyanocobalamin (,VITAMIN B-12,) 1000 MCG/ML injection Inject 1,000 mcg into the muscle. One injection every 2 months      . DimenhyDRINATE (DRAMAMINE) 50 MG CHEW Chew 1 tablet by mouth as needed.        . Famotidine (PEPCID PO) Take 1 tablet by mouth daily as needed.        . fluticasone (FLONASE) 50 MCG/ACT nasal spray Place 2 sprays into the nose daily.  16 g  11  . loratadine (CLARITIN)  10 MG tablet Take 10 mg by mouth daily.        Marland Kitchen losartan (COZAAR) 100 MG tablet Take 1 tablet (100 mg total) by mouth daily.  30 tablet  11       Review of Systems Review of Systems  Constitutional: Negative for fever, appetite change, fatigue and unexpected weight change.  Eyes: Negative for pain and visual disturbance.  Respiratory: Negative for cough and shortness of breath.   Cardiovascular: Negative for cp or palpitations    Gastrointestinal: Negative for nausea, diarrhea and constipation.  Genitourinary: Negative for urgency and frequency. neg for vaginal d/c or pelvic pain  Skin: Negative for pallor or rash  pos for itching in vulvar area  Neurological: Negative for weakness, light-headedness, numbness and headaches.  Hematological: Negative for adenopathy. Does not bruise/bleed easily.  Psychiatric/Behavioral: Negative for dysphoric mood. The patient is not nervous/anxious.         Objective:   Physical Exam  Constitutional: She appears well-developed and well-nourished. No distress.  HENT:    Head: Normocephalic and atraumatic.  Mouth/Throat: Oropharynx is clear and moist.  Eyes: Conjunctivae normal and EOM are normal. Pupils are equal, round, and reactive to light.  Neck: Normal range of motion. Neck supple.  Cardiovascular: Normal rate.   Pulmonary/Chest: Effort normal and breath sounds normal.  Abdominal: Soft. Bowel sounds are normal. She exhibits no distension and no mass. There is no tenderness.       No suprapubic tenderness or fullness    Genitourinary: There is no rash, tenderness or lesion on the right labia. There is no rash, tenderness or lesion on the left labia. There is erythema around the vagina. No tenderness around the vagina. Vaginal discharge found.       Scant white vag d/c noted  No excoriations  Wet prep obtained  Musculoskeletal: She exhibits no edema.  Lymphadenopathy:    She has no cervical adenopathy.  Neurological: She is alert.  Skin: Skin is warm and dry. No rash noted.  Psychiatric: She has a normal mood and affect.          Assessment & Plan:

## 2012-04-01 NOTE — Patient Instructions (Addendum)
I think you have a vaginal yeast infection - brought on from recent antibiotics Take the diflucan pill - if it is too big it is ok to cut it  Also use terazol cream on itchy areas daily as needed  Try not to wear constrictive clothing for several days  Update if not starting to improve in a week or if worsening

## 2012-04-02 LAB — POCT WET PREP (WET MOUNT)
KOH Wet Prep POC: NEGATIVE
Trichomonas Wet Prep HPF POC: NEGATIVE

## 2012-04-28 ENCOUNTER — Ambulatory Visit (INDEPENDENT_AMBULATORY_CARE_PROVIDER_SITE_OTHER): Payer: Medicare Other | Admitting: Family Medicine

## 2012-04-28 ENCOUNTER — Encounter: Payer: Self-pay | Admitting: Family Medicine

## 2012-04-28 VITALS — BP 136/70 | HR 75 | Temp 98.8°F | Ht 61.75 in | Wt 136.0 lb

## 2012-04-28 DIAGNOSIS — R42 Dizziness and giddiness: Secondary | ICD-10-CM | POA: Diagnosis not present

## 2012-04-28 MED ORDER — MECLIZINE HCL 25 MG PO TABS: 25.0000 mg | ORAL_TABLET | Freq: Three times a day (TID) | ORAL | Status: DC | PRN

## 2012-04-28 NOTE — Assessment & Plan Note (Signed)
Recurrent - and not severe today  Pt has long hx of sinus and ear problems  Was once scheduled for testing at ENT office- but cancelled because she got better Will try meclizine 25 up to tid with warning of sedation Use flonase Update if not starting to improve in a week or if worsening

## 2012-04-28 NOTE — Patient Instructions (Addendum)
For vertigo- try the meclizine px - up to three times per day  Stop the bonine Continue flonase If new symptoms- like fever or facial pain - let me know  Update if not starting to improve in a week or if worsening

## 2012-04-28 NOTE — Progress Notes (Signed)
Subjective:    Patient ID: Jenna Young, female    DOB: [birth year], 79 y.o.   MRN: [id number]  HPI Here with a flare of her vertigo  Started over a week ago -- waxes and wanes-some moments better than others This time it happened while she was sitting  Has sensation of the room spinning - has to sit down quickly  (afraid she will fall) Has to be very careful not to move her head very quickly   Took bonine - helps some    No fever  Had had some sinus pressure over her eyes on both sides  occ her neck aches  Has had a lot of congestion  Ears feel ok for now -has had problems in the past - saw ENT and told she was ok , but she has TMJ occ nosebleed  Hearing is about the same   Patient Active Problem List  Diagnosis  . VITAMIN B12 DEFICIENCY  . UNSPECIFIED VITAMIN D DEFICIENCY  . HYPERLIPIDEMIA  . ANXIETY  . GRIEF REACTION  . VENOUS INSUFFICIENCY  . ALLERGIC RHINITIS  . GERD  . OSTEOPOROSIS  . HYPERTENSION NEC  . Gynecological examination  . Viral URI with cough  . Sore throat  . ETD (eustachian tube dysfunction)  . Vertigo  . Colon cancer screening  . Anemia  . Chronic right ear pain  . Itching in the vaginal area   Past Medical History  Diagnosis Date  . GERD (gastroesophageal reflux disease)   . Allergic rhinitis   . Hyperlipidemia   . Osteopenia   . Osteoporosis   . Anxiety   . Hypertension    Past Surgical History  Procedure Laterality Date  . Appendectomy    . Cesarean section    . Cholecystectomy    . Ovarian cyst surgery    . Fracture surgery  07/2008    neck  . Breast surgery  10/10    benign biopsy  negative   History  Substance Use Topics  . Smoking status: Former Smoker    Quit date: 03/11/1968  . Smokeless tobacco: Not on file  . Alcohol Use: No   Family History  Problem Relation Age of Onset  . Hypertension Mother   . Heart disease Father   . Macular degeneration Sister   . Diabetes Maternal Aunt   . Cancer Maternal Aunt    throat   Allergies  Allergen Reactions  . Buspirone Hcl     REACTION: HEART PALPITATIONS  . Codeine     REACTION: nausea and vomiting  . Hydrochlorothiazide     REACTION: syncope-possibly from dehydration  . Lansoprazole     REACTION: abd pain  . Minocycline Hcl   . Penicillins     REACTION: mouth numbness  . Ramipril     Cough   . Sulfa Antibiotics Nausea Only   Current Outpatient Prescriptions on File Prior to Visit  Medication Sig Dispense Refill  . amLODipine (NORVASC) 5 MG tablet Take 1 tablet (5 mg total) by mouth daily.  30 tablet  11  . aspirin 325 MG tablet Take 325 mg by mouth as needed.        . calcium-vitamin D (OSCAL WITH D) 500-200 MG-UNIT per tablet Take 2 tablets by mouth daily.        . Cholecalciferol (VITAMIN D) 2000 UNITS CAPS Take 1 capsule by mouth daily.        . cyanocobalamin (,VITAMIN B-12,) 1000 MCG/ML injection Inject 1,000 mcg into the muscle.  One injection every 2 months      . DimenhyDRINATE (DRAMAMINE) 50 MG CHEW Chew 1 tablet by mouth as needed.        . Famotidine (PEPCID PO) Take 1 tablet by mouth daily as needed.        . fluconazole (DIFLUCAN) 150 MG tablet Take 1 tablet (150 mg total) by mouth once.  1 tablet  0  . fluticasone (FLONASE) 50 MCG/ACT nasal spray Place 2 sprays into the nose daily.  16 g  11  . loratadine (CLARITIN) 10 MG tablet Take 10 mg by mouth daily.        Marland Kitchen losartan (COZAAR) 100 MG tablet Take 1 tablet (100 mg total) by mouth daily.  30 tablet  11  . terconazole (TERAZOL 3) 0.8 % vaginal cream Apply to affected area once daily as needed  20 g  0   No current facility-administered medications on file prior to visit.      Review of Systems Review of Systems  Constitutional: Negative for fever, appetite change, fatigue and unexpected weight change.  ENT pos for intermittent post nasal drip and ear fullness/ neg for ear drainage or sinus pain  Eyes: Negative for pain and visual disturbance.  Respiratory: Negative for  cough and shortness of breath.   Cardiovascular: Negative for cp or palpitations    Gastrointestinal: Negative for nausea, diarrhea and constipation.  Genitourinary: Negative for urgency and frequency.  Skin: Negative for pallor or rash   Neurological: Negative for weakness,  numbness and headaches. neg for problems with speech / neg for facial droop Hematological: Negative for adenopathy. Does not bruise/bleed easily.  Psychiatric/Behavioral: Negative for dysphoric mood. The patient is not nervous/anxious.         Objective:   Physical Exam  Constitutional: She is oriented to person, place, and time. She appears well-developed and well-nourished. No distress.  Well app elderly female -who moves slowly  HENT:  Head: Normocephalic and atraumatic.  Right Ear: External ear normal.  Left Ear: External ear normal.  Mouth/Throat: Oropharynx is clear and moist. No oropharyngeal exudate.  Nares are boggy but clear  TMs- scarred  Hearing aid intact Throat clear- some post nasal drip No sinus tenderness  Eyes: Conjunctivae and EOM are normal. Pupils are equal, round, and reactive to light. Right eye exhibits no discharge. Left eye exhibits no discharge.  2-3 beats of horizontal nystagmus bilaterally  Neck: Normal range of motion. Neck supple. No JVD present. Carotid bruit is not present. No thyromegaly present.  Cardiovascular: Normal rate and regular rhythm.   Pulmonary/Chest: Effort normal and breath sounds normal. No respiratory distress. She has no wheezes.  Musculoskeletal: She exhibits no edema.  Lymphadenopathy:    She has no cervical adenopathy.  Neurological: She is alert and oriented to person, place, and time. She has normal reflexes. No cranial nerve deficit or sensory deficit. She exhibits normal muscle tone. Coordination and gait normal.  No focal cerebellar signs  Skin: Skin is warm and dry. No rash noted. No erythema. No pallor.  Psychiatric: She has a normal mood and  affect.          Assessment & Plan:

## 2012-05-19 ENCOUNTER — Ambulatory Visit (INDEPENDENT_AMBULATORY_CARE_PROVIDER_SITE_OTHER): Payer: Medicare Other | Admitting: *Deleted

## 2012-05-19 DIAGNOSIS — E538 Deficiency of other specified B group vitamins: Secondary | ICD-10-CM

## 2012-05-19 MED ORDER — CYANOCOBALAMIN 1000 MCG/ML IJ SOLN
1000.0000 ug | Freq: Once | INTRAMUSCULAR | Status: AC
  Administered 2012-05-19: 1000 ug via INTRAMUSCULAR

## 2012-06-15 ENCOUNTER — Ambulatory Visit (INDEPENDENT_AMBULATORY_CARE_PROVIDER_SITE_OTHER): Payer: Medicare Other | Admitting: Family Medicine

## 2012-06-15 ENCOUNTER — Encounter: Payer: Self-pay | Admitting: Family Medicine

## 2012-06-15 VITALS — BP 168/76 | HR 88 | Temp 97.8°F | Wt 138.8 lb

## 2012-06-15 DIAGNOSIS — H8110 Benign paroxysmal vertigo, unspecified ear: Secondary | ICD-10-CM | POA: Diagnosis not present

## 2012-06-15 MED ORDER — MECLIZINE HCL 25 MG PO TABS: 25.0000 mg | ORAL_TABLET | Freq: Three times a day (TID) | ORAL | Status: DC | PRN

## 2012-06-15 NOTE — Patient Instructions (Addendum)
Take your blood pressure medicine when you get home, let me know if blood pressure is staying consistently elevated. You have benign paroxysmal vertigo - we did maneuvers today to get pebbles back into place. May do maneuvers provided today to keep them in place. No sudden head turns or bending over for next 24 hours. May use meclizine as needed at home (may make you sleepy).  Benign Positional Vertigo Vertigo means you feel like you or your surroundings are moving when they are not. Benign positional vertigo is the most common form of vertigo. Benign means that the cause of your condition is not serious. Benign positional vertigo is more common in older adults. CAUSES  Benign positional vertigo is the result of an upset in the labyrinth system. This is an area in the middle ear that helps control your balance. This may be caused by a viral infection, head injury, or repetitive motion. However, often no specific cause is found. SYMPTOMS  Symptoms of benign positional vertigo occur when you move your head or eyes in different directions. Some of the symptoms may include:  Loss of balance and falls.  Vomiting.  Blurred vision.  Dizziness.  Nausea.  Involuntary eye movements (nystagmus). DIAGNOSIS  Benign positional vertigo is usually diagnosed by physical exam. If the specific cause of your benign positional vertigo is unknown, your caregiver may perform imaging tests, such as magnetic resonance imaging (MRI) or computed tomography (CT). TREATMENT  Your caregiver may recommend movements or procedures to correct the benign positional vertigo. Medicines such as meclizine, benzodiazepines, and medicines for nausea may be used to treat your symptoms. In rare cases, if your symptoms are caused by certain conditions that affect the inner ear, you may need surgery. HOME CARE INSTRUCTIONS   Follow your caregiver's instructions.  Move slowly. Do not make sudden body or head movements.  Avoid  driving.  Avoid operating heavy machinery.  Avoid performing any tasks that would be dangerous to you or others during a vertigo episode.  Drink enough fluids to keep your urine clear or pale yellow. SEEK IMMEDIATE MEDICAL CARE IF:   You develop problems with walking, weakness, numbness, or using your arms, hands, or legs.  You have difficulty speaking.  You develop severe headaches.  Your nausea or vomiting continues or gets worse.  You develop visual changes.  Your family or friends notice any behavioral changes.  Your condition gets worse.  You have a fever.  You develop a stiff neck or sensitivity to light. MAKE SURE YOU:   Understand these instructions.  Will watch your condition.  Will get help right away if you are not doing well or get worse. Document Released: 12/03/2005 Document Revised: 05/20/2011 Document Reviewed: 11/15/2010 [facility] Patient Information 2013 [place], [place].

## 2012-06-15 NOTE — Assessment & Plan Note (Signed)
Treated in office with right modified epley maneuver. Provided with home treatment handout. Meclizine updated. To update [place] if sxs persist. No evidence of central cause of vertigo today.

## 2012-06-15 NOTE — Progress Notes (Signed)
  Subjective:    Patient ID: Jenna Young, female    DOB: 1931/10/15, 79 y.o.   MRN: [id number]  HPI CC: vertigo  2d h/o vertigo - started when turned over in bed.  Dizziness described as room spinning sensation.  Spinning episodes last a few seconds.  Nauseated with this. Happened again this morning.  Happens more when turning to right.  Persistent dizziness. Has had similar episodes 8 years ago - vertigo.  No fevers/chills, tinnitus, vomiting, headache, recent cold sxs.  No falls.  Has been taking out of date meclizine which did help some.  bp elevated recently.  Did not take bp meds this morning. BP Readings from Last 3 Encounters:  06/15/12 168/76  04/28/12 136/70  04/01/12 144/60   Lives alone.  Widower.   Past Medical History  Diagnosis Date  . GERD (gastroesophageal reflux disease)   . Allergic rhinitis   . Hyperlipidemia   . Osteopenia   . Osteoporosis   . Anxiety   . Hypertension      Review of Systems Per HPI    Objective:   Physical Exam  Nursing note and vitals reviewed. Constitutional: She appears well-developed and well-nourished. No distress.  HENT:  Head: Normocephalic and atraumatic.  Right Ear: Tympanic membrane, external ear and ear canal normal. Decreased hearing is noted.  Left Ear: Tympanic membrane, external ear and ear canal normal. Decreased hearing is noted.  Nose: Nose normal. No mucosal edema or rhinorrhea.  Mouth/Throat: Uvula is midline, oropharynx is clear and moist and mucous membranes are normal. No oropharyngeal exudate, posterior oropharyngeal edema, posterior oropharyngeal erythema or tonsillar abscesses.  Eyes: Conjunctivae and EOM are normal. Pupils are equal, round, and reactive to light. No scleral icterus.  Neck: Normal range of motion. Neck supple. Carotid bruit is not present.  Cardiovascular: Normal rate, regular rhythm, normal heart sounds and intact distal pulses.   No murmur heard. Pulmonary/Chest: Effort normal and  breath sounds normal. No respiratory distress. She has no wheezes. She has no rales.  Lymphadenopathy:    She has no cervical adenopathy.  Neurological: She has normal strength. No cranial nerve deficit or sensory deficit.  CN 2-12 intact. Normal FTN + dix hallpike with horizontal nystagmus with testing on right       Assessment & Plan:

## 2012-06-19 ENCOUNTER — Encounter: Payer: Self-pay | Admitting: Family Medicine

## 2012-06-19 ENCOUNTER — Ambulatory Visit (INDEPENDENT_AMBULATORY_CARE_PROVIDER_SITE_OTHER): Payer: Medicare Other | Admitting: Family Medicine

## 2012-06-19 VITALS — BP 152/70 | HR 99 | Temp 98.0°F | Wt 136.0 lb

## 2012-06-19 DIAGNOSIS — H8110 Benign paroxysmal vertigo, unspecified ear: Secondary | ICD-10-CM | POA: Diagnosis not present

## 2012-06-19 DIAGNOSIS — IMO0002 Reserved for concepts with insufficient information to code with codable children: Secondary | ICD-10-CM

## 2012-06-19 MED ORDER — AMLODIPINE BESYLATE 10 MG PO TABS: 10.0000 mg | ORAL_TABLET | Freq: Every day | ORAL | Status: DC

## 2012-06-19 NOTE — Patient Instructions (Addendum)
For blood pressure - increase amlodipine to 10mg  daily (2 pills daily until you run out then new prescription will be 1 pill daily) For vertigo - continue home exercises, and meclizine.  If persistent into next week, call me for referral for vertigo rehab. Good to see you today, I'm sorry you're not feeling better yet. Start 81mg  enteric coated aspirin daily.

## 2012-06-19 NOTE — Progress Notes (Signed)
  Subjective:    Patient ID: Jenna Young, female    DOB: [birth year], 79 y.o.   MRN: [id number]  HPI CC: high blood pressures  See prior note for details.  Seen here on Monday with dx BPPV - treated in office with epley maneuver - and has been doing modified epley which has helped.  However, this morning when sat up in bed had another episode of vertigo.  Noticed R earache today. Meclizine helps vertigo.  At bp has been elevated - up to 176/71 this morning.  did take meds this morning. No low blood pressures.  No chest pain/tightness, dyspnea, headaches.  No peripheral edema.  No fevers/chills.  No unilateral weakness, slurred speech.  BP Readings from Last 3 Encounters:  06/19/12 152/70  06/15/12 168/76  04/28/12 136/70    Past Medical History  Diagnosis Date  . GERD (gastroesophageal reflux disease)   . Allergic rhinitis   . Hyperlipidemia   . Osteopenia   . Osteoporosis   . Anxiety   . Hypertension      Review of Systems Per HPI    Objective:   Physical Exam  Nursing note and vitals reviewed. Constitutional: She appears well-developed and well-nourished. No distress.  HENT:  Head: Normocephalic and atraumatic.  Right Ear: Tympanic membrane, external ear and ear canal normal. Decreased hearing is noted.  Left Ear: Tympanic membrane, external ear and ear canal normal. Decreased hearing is noted.  Nose: Nose normal. No mucosal edema or rhinorrhea.  Mouth/Throat: Uvula is midline, oropharynx is clear and moist and mucous membranes are normal. No oropharyngeal exudate, posterior oropharyngeal edema, posterior oropharyngeal erythema or tonsillar abscesses.  Hearing aide in left ear Mild cerumen cleaned our of right canal  Eyes: Conjunctivae and EOM are normal. Pupils are equal, round, and reactive to light. No scleral icterus.  Neck: Normal range of motion. Neck supple. Carotid bruit is not present.  Cardiovascular: Normal rate, regular rhythm, normal heart sounds and  intact distal pulses.   No murmur heard. Pulmonary/Chest: Effort normal and breath sounds normal. No respiratory distress. She has no wheezes. She has no rales.  Lymphadenopathy:    She has no cervical adenopathy.  Neurological: She has normal strength. No cranial nerve deficit or sensory deficit.  CN 2-12 intact. Normal FTN dix hallpike negative. epley maneuver performed today.       Assessment & Plan:

## 2012-06-19 NOTE — Assessment & Plan Note (Signed)
Persistent BPPV.  Initially improved, then deteriorated again today. Re performed epley today in office, recommended continue modified epley at home.   Continue meclizine prn. If persists into next week, advised to call us for vestibular rehab.  Pt agrees with plan. Longstanding vertigo issues.

## 2012-06-19 NOTE — Assessment & Plan Note (Signed)
Persistently elevated, possibly attributable to feeling poorly from vertigo, however given persistent issues, I did recommend increasing amlodipine to 10mg  daily. If uncontrolled, low threshold to start HCTZ.

## 2012-06-22 ENCOUNTER — Telehealth: Payer: Self-pay | Admitting: Family Medicine

## 2012-06-22 ENCOUNTER — Telehealth: Payer: Self-pay | Admitting: *Deleted

## 2012-06-22 DIAGNOSIS — R42 Dizziness and giddiness: Secondary | ICD-10-CM

## 2012-06-22 DIAGNOSIS — Z0279 Encounter for issue of other medical certificate: Secondary | ICD-10-CM

## 2012-06-22 NOTE — Telephone Encounter (Signed)
Done and in IN box 

## 2012-06-22 NOTE — Telephone Encounter (Signed)
Placed referral in chart. How are blood pressures? Will route to PCP as fyi.

## 2012-06-22 NOTE — Telephone Encounter (Signed)
Pt dropped off a Release for Activity form to be completed.  She is requesting to pick it up tomorrow, if possible.  I advised her I would give it to you and she would be called once it's completed.

## 2012-06-22 NOTE — Telephone Encounter (Signed)
Patient notified. She will await call from [facility] for referral. Her BP is doing much better. It was 134/57 today and hasn't been much higher than that.

## 2012-06-22 NOTE — Telephone Encounter (Signed)
Patient says she was told to call and let you know if her vertigo was not better and you would refer her for vertigo rehab at [facility]. Patient says she is not better and would like this referral.

## 2012-06-22 NOTE — Telephone Encounter (Signed)
Form placed in your inbox

## 2012-06-23 NOTE — Addendum Note (Signed)
Addended by: Eustaquio Boyden on: 06/23/2012 10:31 AM   Modules accepted: Orders

## 2012-06-23 NOTE — Telephone Encounter (Signed)
Pt notified form ready for pick-up and advise of fee 

## 2012-07-07 ENCOUNTER — Encounter: Payer: Self-pay | Admitting: Family Medicine

## 2012-07-07 DIAGNOSIS — IMO0001 Reserved for inherently not codable concepts without codable children: Secondary | ICD-10-CM | POA: Diagnosis not present

## 2012-07-07 DIAGNOSIS — H8110 Benign paroxysmal vertigo, unspecified ear: Secondary | ICD-10-CM | POA: Diagnosis not present

## 2012-07-09 ENCOUNTER — Encounter: Payer: Self-pay | Admitting: Family Medicine

## 2012-07-09 DIAGNOSIS — R42 Dizziness and giddiness: Secondary | ICD-10-CM | POA: Diagnosis not present

## 2012-07-09 DIAGNOSIS — I69998 Other sequelae following unspecified cerebrovascular disease: Secondary | ICD-10-CM | POA: Diagnosis not present

## 2012-07-09 DIAGNOSIS — H8110 Benign paroxysmal vertigo, unspecified ear: Secondary | ICD-10-CM | POA: Diagnosis not present

## 2012-07-09 DIAGNOSIS — IMO0001 Reserved for inherently not codable concepts without codable children: Secondary | ICD-10-CM | POA: Diagnosis not present

## 2012-07-17 ENCOUNTER — Encounter: Payer: Self-pay | Admitting: Family Medicine

## 2012-07-17 ENCOUNTER — Ambulatory Visit (INDEPENDENT_AMBULATORY_CARE_PROVIDER_SITE_OTHER): Payer: Medicare Other | Admitting: Family Medicine

## 2012-07-17 VITALS — BP 122/60 | HR 88 | Temp 98.8°F | Ht 61.75 in | Wt 139.5 lb

## 2012-07-17 DIAGNOSIS — IMO0002 Reserved for concepts with insufficient information to code with codable children: Secondary | ICD-10-CM | POA: Diagnosis not present

## 2012-07-17 DIAGNOSIS — E538 Deficiency of other specified B group vitamins: Secondary | ICD-10-CM | POA: Diagnosis not present

## 2012-07-17 DIAGNOSIS — R42 Dizziness and giddiness: Secondary | ICD-10-CM

## 2012-07-17 NOTE — Progress Notes (Signed)
Subjective:    Patient ID: Jenna Young, female    DOB: 1931/09/01, 79 y.o.   MRN: [id number]  HPI Here for f/u of chronic medical problems   Has had vertigo problem - saw Dr Reece Agar  She is going to "vertigo rehab" - has had 7 treatments - and she is getting better   Wt is up 3 lb with bmi of 25  bp is stable today  No cp or palpitations or headaches or edema - Dr Reece Agar had to increase her bp medicine -inc amlodipine to 10 mg  She wears support stockings when she can No side effects to medicines  BP Readings from Last 3 Encounters:  07/17/12 122/60  06/19/12 152/70  06/15/12 168/76    She goes to the gym 3 times per week   Hx of B12 def Lab Results  Component Value Date   VITAMINB12 231 01/10/2012   was getting shots-now they are not avail  Hyperlipidemia Lab Results  Component Value Date   CHOL 194 01/10/2012   HDL 56.80 01/10/2012   LDLCALC 119* 01/10/2012   LDLDIRECT 114.5 12/04/2010   TRIG 93.0 01/10/2012   CHOLHDL 3 01/10/2012     Patient Active Problem List   Diagnosis Date Noted  . BPPV (benign paroxysmal positional vertigo) 06/15/2012  . Itching in the vaginal area 04/01/2012  . Chronic right ear pain 03/16/2012  . Colon cancer screening 01/17/2012  . Anemia 01/17/2012  . Vertigo 11/25/2011  . ETD (eustachian tube dysfunction) 08/14/2011  . Sore throat 03/06/2011  . Gynecological examination 12/11/2010  . GRIEF REACTION 08/01/2009  . UNSPECIFIED VITAMIN D DEFICIENCY 08/19/2008  . ANXIETY 01/29/2008  . HYPERTENSION NEC 09/25/2007  . VITAMIN B12 DEFICIENCY 09/24/2006  . HYPERLIPIDEMIA 09/24/2006  . VENOUS INSUFFICIENCY 09/24/2006  . ALLERGIC RHINITIS 09/24/2006  . GERD 09/24/2006  . OSTEOPOROSIS 09/24/2006   Past Medical History  Diagnosis Date  . GERD (gastroesophageal reflux disease)   . Allergic rhinitis   . Hyperlipidemia   . Osteopenia   . Osteoporosis   . Anxiety   . Hypertension    Past Surgical History  Procedure Laterality Date  . Appendectomy     . Cesarean section    . Cholecystectomy    . Ovarian cyst surgery    . Fracture surgery  07/2008    neck  . Breast surgery  10/10    benign biopsy  negative   History  Substance Use Topics  . Smoking status: Former Smoker    Quit date: 03/11/1968  . Smokeless tobacco: Not on file  . Alcohol Use: No   Family History  Problem Relation Age of Onset  . Hypertension Mother   . Heart disease Father   . Macular degeneration Sister   . Diabetes Maternal Aunt   . Cancer Maternal Aunt     throat   Allergies  Allergen Reactions  . Buspirone Hcl     REACTION: HEART PALPITATIONS  . Codeine     REACTION: nausea and vomiting  . Hydrochlorothiazide     REACTION: syncope-possibly from dehydration  . Lansoprazole     REACTION: abd pain  . Minocycline Hcl   . Penicillins     REACTION: mouth numbness  . Ramipril     Cough   . Sulfa Antibiotics Nausea Only   Current Outpatient Prescriptions on File Prior to Visit  Medication Sig Dispense Refill  . amLODipine (NORVASC) 10 MG tablet Take 1 tablet (10 mg total) by mouth daily.  30 tablet  11  . aspirin (ASPIRIN EC) 81 MG EC tablet Take 81 mg by mouth daily. Swallow whole.      . calcium-vitamin D (OSCAL WITH D) 500-200 MG-UNIT per tablet Take 2 tablets by mouth daily.        . cetirizine (ZYRTEC) 10 MG tablet Take 10 mg by mouth daily.      . Cholecalciferol (VITAMIN D) 2000 UNITS CAPS Take 1 capsule by mouth daily.        . cyanocobalamin (,VITAMIN B-12,) 1000 MCG/ML injection Inject 1,000 mcg into the muscle. One injection every 2 months      . DimenhyDRINATE (DRAMAMINE) 50 MG CHEW Chew 1 tablet by mouth as needed.        . Famotidine (PEPCID PO) Take 1 tablet by mouth daily as needed.        . ferrous sulfate 325 (65 FE) MG tablet Take 325 mg by mouth daily.      . fluticasone (FLONASE) 50 MCG/ACT nasal spray Place 2 sprays into the nose daily.  16 g  11  . losartan (COZAAR) 100 MG tablet Take 1 tablet (100 mg total) by mouth  daily.  30 tablet  11  . meclizine (ANTIVERT) 25 MG tablet Take 1 tablet (25 mg total) by mouth 3 (three) times daily as needed for dizziness.  30 tablet  1  . terconazole (TERAZOL 3) 0.8 % vaginal cream Apply to affected area once daily as needed  20 g  0   No current facility-administered medications on file prior to visit.    Review of Systems Review of Systems  Constitutional: Negative for fever, appetite change, fatigue and unexpected weight change.  Eyes: Negative for pain and visual disturbance.  Respiratory: Negative for cough and shortness of breath.   Cardiovascular: Negative for cp or palpitations    Gastrointestinal: Negative for nausea, diarrhea and constipation.  Genitourinary: Negative for urgency and frequency.  Skin: Negative for pallor or rash   Neurological: Negative for weakness, , numbness and headaches. pos for vertigo that is improving  Hematological: Negative for adenopathy. Does not bruise/bleed easily.  Psychiatric/Behavioral: Negative for dysphoric mood. The patient is not nervous/anxious.         Objective:   Physical Exam  Constitutional: She appears well-developed and well-nourished. No distress.  HENT:  Head: Normocephalic and atraumatic.  Right Ear: External ear normal.  Left Ear: External ear normal.  Nose: Nose normal.  Mouth/Throat: Oropharynx is clear and moist.  Eyes: Conjunctivae and EOM are normal. Pupils are equal, round, and reactive to light. Right eye exhibits no discharge. Left eye exhibits no discharge. No scleral icterus.  No nystagmus today  Neck: Normal range of motion. Neck supple. No JVD present. Carotid bruit is not present. No thyromegaly present.  Cardiovascular: Normal rate, regular rhythm, normal heart sounds and intact distal pulses.  Exam reveals no gallop.   Pulmonary/Chest: Effort normal and breath sounds normal. No respiratory distress. She has no wheezes. She has no rales.  No crackles   Abdominal: Soft. Bowel sounds are  normal. She exhibits no distension, no abdominal bruit and no mass. There is no tenderness.  Musculoskeletal: She exhibits no edema.  Lymphadenopathy:    She has no cervical adenopathy.  Neurological: She is alert. She has normal reflexes. No cranial nerve deficit. She exhibits normal muscle tone. Coordination normal.  Skin: Skin is warm and dry. No rash noted. No erythema. No pallor.  Psychiatric: She has a normal mood and affect.  Assessment & Plan:

## 2012-07-17 NOTE — Assessment & Plan Note (Signed)
Going to rehab for vertigo and slowly improving -uses meclizine sparingly  Disc safety and will keep me updated

## 2012-07-17 NOTE — Assessment & Plan Note (Signed)
Since shots are no longer available - will start 1000 mcg orally otc daily

## 2012-07-17 NOTE — Patient Instructions (Addendum)
Your blood pressure is great  Keep taking current medicines If you develop any more side effects to the amlodipine (blood pressure medicine) - let me know , and I think the swelling in your legs is going to improve with cooler weather - elevate legs and use support hose when you need to  Good luck with the vertigo rehabilitation  Take care of yourself  Get B12 supplement 1000 mcg daily over the counter (1 pill today)  Follow up in 6 months for annual exam with lab prior

## 2012-07-19 NOTE — Assessment & Plan Note (Signed)
This is improved with inc of norvasc to 10 mg - pt c/o edema but it is not too bothersome  Disc elevating feet or wearing supp hose She is intol to several other bp agents and I am hesitant to change something that works  Will continue to monitor

## 2012-07-21 ENCOUNTER — Ambulatory Visit: Payer: Medicare Other

## 2012-08-06 ENCOUNTER — Other Ambulatory Visit: Payer: Self-pay

## 2012-08-06 NOTE — Telephone Encounter (Signed)
If they have it -she can have 6 mo of refills

## 2012-08-06 NOTE — Telephone Encounter (Signed)
Pt request order for injectable  B 12 to [facility]. Pt request call back when done.

## 2012-08-07 ENCOUNTER — Ambulatory Visit (INDEPENDENT_AMBULATORY_CARE_PROVIDER_SITE_OTHER): Payer: Medicare Other | Admitting: *Deleted

## 2012-08-07 DIAGNOSIS — E538 Deficiency of other specified B group vitamins: Secondary | ICD-10-CM | POA: Diagnosis not present

## 2012-08-07 MED ORDER — CYANOCOBALAMIN 1000 MCG/ML IJ SOLN: 1000.0000 ug | INTRAMUSCULAR | Status: DC

## 2012-08-07 MED ORDER — CYANOCOBALAMIN 1000 MCG/ML IJ SOLN
1000.0000 ug | Freq: Once | INTRAMUSCULAR | Status: AC
  Administered 2012-08-07: 1000 ug via INTRAMUSCULAR

## 2012-08-07 NOTE — Telephone Encounter (Signed)
Checked with the pharmacy and they do have b12 inj available, Rx sent to pharmacy and left voicemail letting pt know Rx was sent to pharmacy

## 2012-08-09 ENCOUNTER — Encounter: Payer: Self-pay | Admitting: Family Medicine

## 2012-08-09 DIAGNOSIS — I69998 Other sequelae following unspecified cerebrovascular disease: Secondary | ICD-10-CM | POA: Diagnosis not present

## 2012-08-09 DIAGNOSIS — R42 Dizziness and giddiness: Secondary | ICD-10-CM | POA: Diagnosis not present

## 2012-08-09 DIAGNOSIS — H8110 Benign paroxysmal vertigo, unspecified ear: Secondary | ICD-10-CM | POA: Diagnosis not present

## 2012-08-09 DIAGNOSIS — IMO0001 Reserved for inherently not codable concepts without codable children: Secondary | ICD-10-CM | POA: Diagnosis not present

## 2012-08-12 DIAGNOSIS — H8110 Benign paroxysmal vertigo, unspecified ear: Secondary | ICD-10-CM | POA: Diagnosis not present

## 2012-08-12 DIAGNOSIS — IMO0001 Reserved for inherently not codable concepts without codable children: Secondary | ICD-10-CM | POA: Diagnosis not present

## 2012-08-12 DIAGNOSIS — I69998 Other sequelae following unspecified cerebrovascular disease: Secondary | ICD-10-CM | POA: Diagnosis not present

## 2012-08-12 DIAGNOSIS — R42 Dizziness and giddiness: Secondary | ICD-10-CM | POA: Diagnosis not present

## 2012-08-13 DIAGNOSIS — D485 Neoplasm of uncertain behavior of skin: Secondary | ICD-10-CM | POA: Diagnosis not present

## 2012-09-25 ENCOUNTER — Ambulatory Visit (INDEPENDENT_AMBULATORY_CARE_PROVIDER_SITE_OTHER): Payer: Medicare Other | Admitting: Family Medicine

## 2012-09-25 ENCOUNTER — Encounter: Payer: Self-pay | Admitting: Internal Medicine

## 2012-09-25 ENCOUNTER — Encounter: Payer: Self-pay | Admitting: Family Medicine

## 2012-09-25 VITALS — BP 126/60 | HR 84 | Temp 98.4°F | Ht 61.75 in | Wt 137.0 lb

## 2012-09-25 DIAGNOSIS — R109 Unspecified abdominal pain: Secondary | ICD-10-CM

## 2012-09-25 DIAGNOSIS — R55 Syncope and collapse: Secondary | ICD-10-CM | POA: Insufficient documentation

## 2012-09-25 LAB — POCT URINALYSIS DIPSTICK
Blood, UA: NEGATIVE
Glucose, UA: NEGATIVE
Ketones, UA: NEGATIVE
Nitrite, UA: NEGATIVE
Spec Grav, UA: 1.01
Urobilinogen, UA: 0.2
pH, UA: 6

## 2012-09-25 MED ORDER — HYOSCYAMINE SULFATE 0.125 MG SL SUBL: 0.1250 mg | SUBLINGUAL_TABLET | Freq: Four times a day (QID) | SUBLINGUAL | Status: DC | PRN

## 2012-09-25 NOTE — Progress Notes (Signed)
Subjective:    Patient ID: Jenna Young, female    DOB: 1931-12-17, 79 y.o.   MRN: [id number]  HPI Here with abdominal pain  Recurrent pain - low abdomen -- (one episode last week and one this week) Starts hurting "really bad" and she gets really hot  Then almost passes out    [place] - called 911  Had eaten breakfast-sausage egg biscuit Took blood sugar - it was good  Took EKG- NSR at rate of 69  Poss biventricular hypertrophy  Once she has a bm she is ok  Has never had a colonoscopy  Has never had a GI specialist   She has passed out in the past  Usually assoc with need to go to the bathroom      Chemistry      Component Value Date/Time   NA 136 01/10/2012 0858   K 4.1 01/10/2012 0858   CL 103 01/10/2012 0858   CO2 25 01/10/2012 0858   BUN 13 01/10/2012 0858   CREATININE 0.9 01/10/2012 0858      Component Value Date/Time   CALCIUM 9.2 01/10/2012 0858   ALKPHOS 71 01/10/2012 0858   AST 18 01/10/2012 0858   ALT 15 01/10/2012 0858   BILITOT 0.4 01/10/2012 0858     Lab Results  Component Value Date   WBC 5.6 01/10/2012   HGB 10.5* 01/10/2012   HCT 31.8* 01/10/2012   MCV 83.2 01/10/2012   PLT 243.0 01/10/2012     Patient Active Problem List   Diagnosis Date Noted  . Abdominal cramping 09/25/2012  . Vaso-vagal reaction 09/25/2012  . BPPV (benign paroxysmal positional vertigo) 06/15/2012  . Itching in the vaginal area 04/01/2012  . Chronic right ear pain 03/16/2012  . Colon cancer screening 01/17/2012  . Anemia 01/17/2012  . Vertigo 11/25/2011  . ETD (eustachian tube dysfunction) 08/14/2011  . Sore throat 03/06/2011  . Gynecological examination 12/11/2010  . GRIEF REACTION 08/01/2009  . UNSPECIFIED VITAMIN D DEFICIENCY 08/19/2008  . ANXIETY 01/29/2008  . HYPERTENSION NEC 09/25/2007  . VITAMIN B12 DEFICIENCY 09/24/2006  . HYPERLIPIDEMIA 09/24/2006  . VENOUS INSUFFICIENCY 09/24/2006  . ALLERGIC RHINITIS 09/24/2006  . GERD  09/24/2006  . OSTEOPOROSIS 09/24/2006   Past Medical History  Diagnosis Date  . GERD (gastroesophageal reflux disease)   . Allergic rhinitis   . Hyperlipidemia   . Osteopenia   . Osteoporosis   . Anxiety   . Hypertension    Past Surgical History  Procedure Laterality Date  . Appendectomy    . Cesarean section    . Cholecystectomy    . Ovarian cyst surgery    . Fracture surgery  07/2008    neck  . Breast surgery  10/10    benign biopsy  negative   History  Substance Use Topics  . Smoking status: Former Smoker    Quit date: 03/11/1968  . Smokeless tobacco: Not on file  . Alcohol Use: No   Family History  Problem Relation Age of Onset  . Hypertension Mother   . Heart disease Father   . Macular degeneration Sister   . Diabetes Maternal Aunt   . Cancer Maternal Aunt     throat   Allergies  Allergen Reactions  . Buspirone Hcl     REACTION: HEART PALPITATIONS  . Codeine     REACTION: nausea and vomiting  . Hydrochlorothiazide     REACTION: syncope-possibly from dehydration  . Lansoprazole  REACTION: abd pain  . Minocycline Hcl   . Penicillins     REACTION: mouth numbness  . Ramipril     Cough   . Sulfa Antibiotics Nausea Only   Current Outpatient Prescriptions on File Prior to Visit  Medication Sig Dispense Refill  . amLODipine (NORVASC) 10 MG tablet Take 1 tablet (10 mg total) by mouth daily.  30 tablet  11  . aspirin (ASPIRIN EC) 81 MG EC tablet Take 81 mg by mouth daily. Swallow whole.      . calcium-vitamin D (OSCAL WITH D) 500-200 MG-UNIT per tablet Take 2 tablets by mouth daily.        . cetirizine (ZYRTEC) 10 MG tablet Take 10 mg by mouth as needed.       . Cholecalciferol (VITAMIN D) 2000 UNITS CAPS Take 1 capsule by mouth daily.        . cyanocobalamin (,VITAMIN B-12,) 1000 MCG/ML injection Inject 1 mL (1,000 mcg total) into the muscle every 8 (eight) weeks. One injection every 2 months  1 mL  5  . DimenhyDRINATE (DRAMAMINE) 50 MG CHEW Chew 1  tablet by mouth as needed.        . Famotidine (PEPCID PO) Take 1 tablet by mouth daily as needed.        . ferrous sulfate 325 (65 FE) MG tablet Take 325 mg by mouth daily.      . fluticasone (FLONASE) 50 MCG/ACT nasal spray Place 2 sprays into the nose daily.  16 g  11  . losartan (COZAAR) 100 MG tablet Take 1 tablet (100 mg total) by mouth daily.  30 tablet  11  . meclizine (ANTIVERT) 25 MG tablet Take 1 tablet (25 mg total) by mouth 3 (three) times daily as needed for dizziness.  30 tablet  1   No current facility-administered medications on file prior to visit.    Review of Systems Review of Systems  Constitutional: Negative for fever, appetite change, fatigue and unexpected weight change.  Eyes: Negative for pain and visual disturbance.  Respiratory: Negative for cough and shortness of breath.   Cardiovascular: Negative for cp or palpitations    Gastrointestinal: Negative for nausea,  and constipation. neg for blood in stools  Genitourinary: Negative for urgency and frequency.  Skin: Negative for pallor or rash   Neurological: Negative for weakness, light-headedness, numbness and headaches. pos for pre syncope (and syncope in the past)- has always been an "easy fainter" Hematological: Negative for adenopathy. Does not bruise/bleed easily.  Psychiatric/Behavioral: Negative for dysphoric mood. The patient is not nervous/anxious.         Objective:   Physical Exam  Constitutional: She appears well-developed and well-nourished. No distress.  HENT:  Head: Normocephalic and atraumatic.  Mouth/Throat: Oropharynx is clear and moist.  Eyes: Conjunctivae and EOM are normal. Pupils are equal, round, and reactive to light. Right eye exhibits no discharge. Left eye exhibits no discharge. No scleral icterus.  Neck: Normal range of motion. Neck supple. No JVD present. Carotid bruit is not present. No thyromegaly present.  Cardiovascular: Normal rate, regular rhythm, normal heart sounds and  intact distal pulses.  Exam reveals no gallop.   No murmur heard. Pulmonary/Chest: Effort normal and breath sounds normal. No respiratory distress. She has no wheezes. She has no rales.  Abdominal: Soft. Bowel sounds are normal. She exhibits no distension, no abdominal bruit and no mass. There is no hepatosplenomegaly. There is no tenderness. There is no rebound, no guarding and no CVA  tenderness.  Musculoskeletal: She exhibits no edema and no tenderness.  Lymphadenopathy:    She has no cervical adenopathy.  Neurological: She is alert. She has normal reflexes. She displays no atrophy and no tremor. No cranial nerve deficit or sensory deficit. She exhibits normal muscle tone. Coordination and gait normal.  No cerebellar signs  Skin: Skin is warm and dry. No rash noted. No erythema. No pallor.  Psychiatric: She has a normal mood and affect.          Assessment & Plan:

## 2012-09-25 NOTE — Patient Instructions (Addendum)
We will do a GI referral at check out -for abdominal cramping If you get cramps in the meantime - try levsin sl under the tongue  Eat bland diet-no fatty or spicy foods  If symptoms worsen please let me know

## 2012-09-27 NOTE — Assessment & Plan Note (Signed)
Related to abd cramping episodes/ diarrhea (ref to GI for this)  Given hx - suspect vaso vagal  No episodes with urination fortunately Disc need to stop the cramping episodes and most importantly- to lie down / sit if she feels like she could faint at any time  If no imp consider cardiovasc eval No s/s of arrhythmia known

## 2012-09-27 NOTE — Assessment & Plan Note (Signed)
This could be from IBS but other etiologies need to be ruled out  Ref to GI (of note-pt has never had a screening colonoscopy) More pressing in that- these episodes can bring on what seems to be a vasovagal reaction  Disc risk of falls with pt and her daughter -who voiced understanding Given levsin for acute cramping to be used SL in the meantime

## 2012-09-29 ENCOUNTER — Other Ambulatory Visit (INDEPENDENT_AMBULATORY_CARE_PROVIDER_SITE_OTHER): Payer: Medicare Other

## 2012-09-29 ENCOUNTER — Ambulatory Visit (INDEPENDENT_AMBULATORY_CARE_PROVIDER_SITE_OTHER): Payer: Medicare Other | Admitting: Internal Medicine

## 2012-09-29 ENCOUNTER — Encounter: Payer: Self-pay | Admitting: Internal Medicine

## 2012-09-29 VITALS — BP 128/70 | HR 80 | Ht 61.5 in | Wt 137.0 lb

## 2012-09-29 DIAGNOSIS — Z1211 Encounter for screening for malignant neoplasm of colon: Secondary | ICD-10-CM

## 2012-09-29 DIAGNOSIS — D649 Anemia, unspecified: Secondary | ICD-10-CM | POA: Diagnosis not present

## 2012-09-29 DIAGNOSIS — R109 Unspecified abdominal pain: Secondary | ICD-10-CM

## 2012-09-29 LAB — CBC
HCT: 34.9 % — ABNORMAL LOW (ref 36.0–46.0)
Hemoglobin: 11.7 g/dL — ABNORMAL LOW (ref 12.0–15.0)
MCHC: 33.6 g/dL (ref 30.0–36.0)
MCV: 83.4 fl (ref 78.0–100.0)
Platelets: 245 10*3/uL (ref 150.0–400.0)
RBC: 4.18 Mil/uL (ref 3.87–5.11)
RDW: 16.7 % — ABNORMAL HIGH (ref 11.5–14.6)
WBC: 6.4 10*3/uL (ref 4.5–10.5)

## 2012-09-29 LAB — IBC PANEL
Iron: 81 ug/dL (ref 42–145)
Saturation Ratios: 21.4 % (ref 20.0–50.0)
Transferrin: 270.7 mg/dL (ref 212.0–360.0)

## 2012-09-29 LAB — FERRITIN: Ferritin: 28.5 ng/mL (ref 10.0–291.0)

## 2012-09-29 MED ORDER — HYOSCYAMINE SULFATE 0.125 MG SL SUBL: 0.1250 mg | SUBLINGUAL_TABLET | Freq: Four times a day (QID) | SUBLINGUAL | Status: DC | PRN

## 2012-09-29 NOTE — Progress Notes (Signed)
Patient ID: Jenna Young, female   DOB: December 17, 1931, 79 y.o.   MRN: [id number] HPI: Jenna Young is a 79 year old female with a past medical history of hypertension, hyperlipidemia, vertigo, long-standing anemia who is seen in consultation at the request of Dr. Milinda Antis for evaluation of lower abdominal cramping and presyncope/syncope.  The patient is here today with her niece and daughter.  The patient reports that she's had episodic intense lower abdominal cramping since January 2012. She associates this directly with oral iron supplementation. She reports episodes of fairly urgent onset lower abdominal cramping which is associated with loose stools. The stools are urgent and if she is not able to defecate immediately, she often has nausea, sweating, and presyncopal symptoms. It sounds that she had an episode of syncope occurring recently during a trip to the beach, again in the setting of lower abdominal intense cramping pain. The cramping is relieved by defecation. The stools are nonbloody and non-melenic.  She estimates this has happened less than once per month and is always been associated with taking oral iron for her anemia. In the intervening times she denies abdominal pain. She notes normal bowel movements without significant diarrhea or constipation. She reports she is eating well for her with no nausea or vomiting. She reports a stable weight. She has never had colonoscopy though she does recall FOB testing.  She was given a prescription for Levsin but has not needed it because she has not had an episode since her beach trip about a month ago.  No known family history of colon cancer. She has previously had cholecystectomy and cesarean section and removal of an ovarian cyst.  Patient Active Problem List   Diagnosis Date Noted  . Abdominal cramping 09/25/2012  . Vaso-vagal reaction 09/25/2012  . BPPV (benign paroxysmal positional vertigo) 06/15/2012  . Chronic right ear pain 03/16/2012  . Colon  cancer screening 01/17/2012  . Anemia 01/17/2012  . Vertigo 11/25/2011  . Gynecological examination 12/11/2010  . GRIEF REACTION 08/01/2009  . UNSPECIFIED VITAMIN D DEFICIENCY 08/19/2008  . ANXIETY 01/29/2008  . HYPERTENSION NEC 09/25/2007  . VITAMIN B12 DEFICIENCY 09/24/2006  . HYPERLIPIDEMIA 09/24/2006  . VENOUS INSUFFICIENCY 09/24/2006  . ALLERGIC RHINITIS 09/24/2006  . GERD 09/24/2006  . OSTEOPOROSIS 09/24/2006    Past Surgical History  Procedure Laterality Date  . Appendectomy    . Cesarean section    . Cholecystectomy    . Ovarian cyst surgery    . Fracture surgery  07/2008    neck  . Breast surgery  10/10    benign biopsy  negative    Current Outpatient Prescriptions  Medication Sig Dispense Refill  . amLODipine (NORVASC) 10 MG tablet Take 1 tablet (10 mg total) by mouth daily.  30 tablet  11  . aspirin (ASPIRIN EC) 81 MG EC tablet Take 81 mg by mouth daily. Swallow whole.      . calcium-vitamin D (OSCAL WITH D) 500-200 MG-UNIT per tablet Take 2 tablets by mouth daily.        . cetirizine (ZYRTEC) 10 MG tablet Take 10 mg by mouth as needed.       . Cholecalciferol (VITAMIN D) 2000 UNITS CAPS Take 1 capsule by mouth daily.        . cyanocobalamin (,VITAMIN B-12,) 1000 MCG/ML injection Inject 1 mL (1,000 mcg total) into the muscle every 8 (eight) weeks. One injection every 2 months  1 mL  5  . DimenhyDRINATE (DRAMAMINE) 50 MG CHEW Chew 1 tablet by mouth  as needed.        . Famotidine (PEPCID PO) Take 1 tablet by mouth daily as needed.        . ferrous sulfate 325 (65 FE) MG tablet Take 325 mg by mouth daily.      . fluticasone (FLONASE) 50 MCG/ACT nasal spray Place 2 sprays into the nose daily.  16 g  11  . hyoscyamine (LEVSIN SL) 0.125 MG SL tablet Place 1 tablet (0.125 mg total) under the tongue every 6 (six) hours as needed for cramping.  15 tablet  0  . losartan (COZAAR) 100 MG tablet Take 1 tablet (100 mg total) by mouth daily.  30 tablet  11  . meclizine  (ANTIVERT) 25 MG tablet Take 1 tablet (25 mg total) by mouth 3 (three) times daily as needed for dizziness.  30 tablet  1   No current facility-administered medications for this visit.    Allergies  Allergen Reactions  . Buspirone Hcl     REACTION: HEART PALPITATIONS  . Codeine     REACTION: nausea and vomiting  . Hydrochlorothiazide     REACTION: syncope-possibly from dehydration  . Lansoprazole     REACTION: abd pain  . Minocycline Hcl   . Penicillins     REACTION: mouth numbness  . Ramipril     Cough   . Sulfa Antibiotics Nausea Only    Family History  Problem Relation Age of Onset  . Hypertension Mother   . Heart disease Father   . Macular degeneration Sister   . Diabetes Maternal Aunt   . Cancer Maternal Aunt     throat    History  Substance Use Topics  . Smoking status: Former Smoker    Quit date: 03/11/1968  . Smokeless tobacco: Never Used  . Alcohol Use: No    ROS: As per history of present illness, otherwise negative  BP 128/70  Pulse 80  Ht 5' 1.5" (1.562 m)  Wt 137 lb (62.143 kg)  BMI 25.47 kg/m2 Constitutional: Well-developed and well-nourished. No distress. HEENT: Normocephalic and atraumatic. Oropharynx is clear and moist. No oropharyngeal exudate. Conjunctivae are normal.  No scleral icterus. Neck: Neck supple. Trachea midline. Cardiovascular: Normal rate, regular rhythm and intact distal pulses. No M/R/G Pulmonary/chest: Effort normal and breath sounds normal. No wheezing, rales or rhonchi. Abdominal: Soft, nontender, nondistended. Bowel sounds active throughout.  Extremities: no clubbing, cyanosis, or edema Lymphadenopathy: No cervical adenopathy noted. Neurological: Alert and oriented to person place and time. Skin: Skin is warm and dry. No rashes noted. Psychiatric: Normal mood and affect. Behavior is normal.  RELEVANT LABS AND IMAGING: CBC    Component Value Date/Time   WBC 5.6 01/10/2012 0858   RBC 3.82* 01/10/2012 0858   HGB  10.5* 01/10/2012 0858   HCT 31.8* 01/10/2012 0858   PLT 243.0 01/10/2012 0858   MCV 83.2 01/10/2012 0858   MCHC 33.1 01/10/2012 0858   RDW 16.4* 01/10/2012 0858   LYMPHSABS 2.1 01/10/2012 0858   MONOABS 0.5 01/10/2012 0858   EOSABS 0.2 01/10/2012 0858   BASOSABS 0.0 01/10/2012 0858    CMP     Component Value Date/Time   NA 136 01/10/2012 0858   K 4.1 01/10/2012 0858   CL 103 01/10/2012 0858   CO2 25 01/10/2012 0858   GLUCOSE 91 01/10/2012 0858   BUN 13 01/10/2012 0858   CREATININE 0.9 01/10/2012 0858   CALCIUM 9.2 01/10/2012 0858   PROT 8.0 01/10/2012 0858   ALBUMIN 3.8 01/10/2012 0858  AST 18 01/10/2012 0858   ALT 15 01/10/2012 0858   ALKPHOS 71 01/10/2012 0858   BILITOT 0.4 01/10/2012 0858   GFRNONAA >60 11/15/2008 0505   GFRAA  Value: >60        The eGFR has been calculated using the MDRD equation. This calculation has not been validated in all clinical situations. eGFR's persistently <60 mL/min signify possible Chronic Kidney Disease. 11/15/2008 0505   FOBT 01/22/2012 - negative  ASSESSMENT/PLAN: 79 year old female with a past medical history of hypertension, hyperlipidemia, vertigo, long-standing anemia who is seen in consultation at the request of Dr. Milinda Antis for evaluation of lower abdominal cramping and presyncope/syncope.   1.  Lower abd cramping pain/syncope -- her lower abdominal cramping is felt most likely to represent severe colonic spasming, possibly caused by oral iron. The pain is often associated with urgent loose stools and then the pain subsides. The episodic nature makes inflammation or infection much less likely. She has stopped oral iron with no subsequent cramping pain in the last month.  We have discussed colonoscopy at length today both for colon cancer screening and to evaluate her episodic pain, but she is very hesitant to undergo this test. She reports having a friend who died of a colonic perforation after colonoscopy.  We discussed other possible screening modalities for the  colon including barium enema and virtual colonoscopy. She is aware that my recommendation is colonoscopy, but she would like to think more about this. For now she will avoid oral iron and I will see her back in 3 months. She continues to have these episodes, she will consider colonoscopy in the future. She can use Levsin on an as-needed basis for lower abdominal cramping pain.  It is reassuring that her stool has been heme negative when checked 3 times over the last several years.  2.  Anemia -- I would like to check her blood counts and iron studies today. If her iron is low, I will recommend IV iron infusion due to the poor toleration of oral iron as discussed in #1  3.  CRC screening -- see #1. Patient declines colonoscopy at this time.

## 2012-09-29 NOTE — Patient Instructions (Addendum)
Your physician has requested that you go to the basement for the following lab work before leaving today: CBC, Iron studies  Stop taking all oral iron  Follow up with Dr. Rhea Belton in office in 3 months                                               We are excited to introduce MyChart, a new best-in-class service that provides you online access to important information in your electronic medical record. We want to make it easier for you to view your health information - all in one secure location - when and where you need it. We expect MyChart will enhance the quality of care and service we provide.  When you register for MyChart, you can:    View your test results.    Request appointments and receive appointment reminders via email.    Request medication renewals.    View your medical history, allergies, medications and immunizations.    Communicate with your physician's office through a password-protected site.    Conveniently print information such as your medication lists.  To find out if MyChart is right for you, please talk to a member of our clinical staff today. We will gladly answer your questions about this free health and wellness tool.  If you are age 77 or older and want a member of your family to have access to your record, you must provide written consent by completing a proxy form available at our office. Please speak to our clinical staff about guidelines regarding accounts for patients younger than age 77.  As you activate your MyChart account and need any technical assistance, please call the MyChart technical support line at [phone]-CHART [phone]) or email your question to [email or web address]. If you email your question(s), please include your name, a return phone number and the best time to reach you.  If you have non-urgent health-related questions, you can send a message to our office through MyChart at [email or web address]. If you have a medical  emergency, call 911.  Thank you for using MyChart as your new health and wellness resource!   MyChart licensed from [employer],  4540-9811. Patents Pending.

## 2012-10-08 ENCOUNTER — Ambulatory Visit (INDEPENDENT_AMBULATORY_CARE_PROVIDER_SITE_OTHER): Payer: Medicare Other | Admitting: Family Medicine

## 2012-10-08 DIAGNOSIS — D518 Other vitamin B12 deficiency anemias: Secondary | ICD-10-CM | POA: Diagnosis not present

## 2012-10-08 DIAGNOSIS — D519 Vitamin B12 deficiency anemia, unspecified: Secondary | ICD-10-CM

## 2012-10-08 MED ORDER — CYANOCOBALAMIN 1000 MCG/ML IJ SOLN
1000.0000 ug | Freq: Once | INTRAMUSCULAR | Status: AC
  Administered 2012-10-08: 1000 ug via INTRAMUSCULAR

## 2012-10-15 DIAGNOSIS — D485 Neoplasm of uncertain behavior of skin: Secondary | ICD-10-CM | POA: Diagnosis not present

## 2012-10-22 ENCOUNTER — Emergency Department: Payer: Self-pay | Admitting: Emergency Medicine

## 2012-10-22 DIAGNOSIS — R42 Dizziness and giddiness: Secondary | ICD-10-CM | POA: Diagnosis not present

## 2012-10-22 DIAGNOSIS — K219 Gastro-esophageal reflux disease without esophagitis: Secondary | ICD-10-CM | POA: Diagnosis not present

## 2012-10-22 DIAGNOSIS — I10 Essential (primary) hypertension: Secondary | ICD-10-CM | POA: Diagnosis not present

## 2012-10-22 DIAGNOSIS — Z79899 Other long term (current) drug therapy: Secondary | ICD-10-CM | POA: Diagnosis not present

## 2012-10-22 DIAGNOSIS — Z9089 Acquired absence of other organs: Secondary | ICD-10-CM | POA: Diagnosis not present

## 2012-10-22 DIAGNOSIS — E860 Dehydration: Secondary | ICD-10-CM | POA: Diagnosis not present

## 2012-10-22 LAB — COMPREHENSIVE METABOLIC PANEL
Albumin: 3.9 g/dL (ref 3.4–5.0)
Alkaline Phosphatase: 85 U/L (ref 50–136)
Anion Gap: 6 — ABNORMAL LOW (ref 7–16)
BUN: 13 mg/dL (ref 7–18)
Bilirubin,Total: 0.3 mg/dL (ref 0.2–1.0)
Calcium, Total: 9.1 mg/dL (ref 8.5–10.1)
Chloride: 103 mmol/L (ref 98–107)
Co2: 27 mmol/L (ref 21–32)
Creatinine: 0.95 mg/dL (ref 0.60–1.30)
EGFR (African American): >60
EGFR (Non-African Amer.): 57 — ABNORMAL LOW
Glucose: 106 mg/dL — ABNORMAL HIGH (ref 65–99)
Osmolality: 272 (ref 275–301)
Potassium: 3.8 mmol/L (ref 3.5–5.1)
SGOT(AST): 20 U/L (ref 15–37)
SGPT (ALT): 20 U/L (ref 12–78)
Sodium: 136 mmol/L (ref 136–145)
Total Protein: 8.7 g/dL — ABNORMAL HIGH (ref 6.4–8.2)

## 2012-10-22 LAB — CBC
HCT: 33.2 % — ABNORMAL LOW (ref 35.0–47.0)
HGB: 11.3 g/dL — ABNORMAL LOW (ref 12.0–16.0)
MCH: 27.9 pg (ref 26.0–34.0)
MCHC: 34.1 g/dL (ref 32.0–36.0)
MCV: 82 fL (ref 80–100)
Platelet: 229 10*3/uL (ref 150–440)
RBC: 4.06 10*6/uL (ref 3.80–5.20)
RDW: 16.2 % — ABNORMAL HIGH (ref 11.5–14.5)
WBC: 6.3 10*3/uL (ref 3.6–11.0)

## 2012-10-22 LAB — TROPONIN I: Troponin-I: <0.02 ng/mL

## 2012-10-26 ENCOUNTER — Telehealth: Payer: Self-pay

## 2012-10-26 NOTE — Telephone Encounter (Signed)
Thanks - I will see her then  

## 2012-10-26 NOTE — Telephone Encounter (Signed)
Pt seen at [facility] ED on 10/22/12 for dizziness; pt had CT scan and EKG and other test that were OK. Pt was to f/u with PCP in 5 days. Pt said she is still taking Meclizine but feels much better. Pt scheduled 30 min ED f/u on 10/27/12 at 2 pm. [facility] ED records requested.

## 2012-10-27 ENCOUNTER — Encounter: Payer: Self-pay | Admitting: Family Medicine

## 2012-10-27 ENCOUNTER — Ambulatory Visit (INDEPENDENT_AMBULATORY_CARE_PROVIDER_SITE_OTHER): Payer: Medicare Other | Admitting: Family Medicine

## 2012-10-27 VITALS — BP 122/78 | HR 90 | Temp 98.4°F | Ht 61.5 in | Wt 137.5 lb

## 2012-10-27 DIAGNOSIS — R42 Dizziness and giddiness: Secondary | ICD-10-CM

## 2012-10-27 DIAGNOSIS — J0120 Acute ethmoidal sinusitis, unspecified: Secondary | ICD-10-CM

## 2012-10-27 MED ORDER — AZITHROMYCIN 250 MG PO TABS: ORAL_TABLET | ORAL | Status: DC

## 2012-10-27 NOTE — Assessment & Plan Note (Addendum)
Intermittent - with a bad but brief episode thurday-sending her to the ER at [facility] Records and studies rev in detail with pt  Will tx sinusitis seen on CT Her symptoms have totally resolved She has been w/u by ENT and knows exercises to do- but I would certainly send her back if necessary  Disc fall prevention Will update if worse/ return of symptoms

## 2012-10-27 NOTE — Progress Notes (Signed)
Subjective:    Patient ID: Jenna Young, female    DOB: 08/09/1931, 79 y.o.   MRN: [id number]  HPI Here for f/u of ER visit at [facility] last Thursday for dizziness  Was hit with dizziness walking to her car after lunch  Spinning / vertigo ? Her bp went up with this episode and she got shaky Was a typical day - but was a little anxious   No falls  Her friend to took her to [facility]  No vomiting or nausea   Had some diarrhea when she got home -- had eaten chicken   Had stable EKG and labs CT scan showed air fluid level in L ethmoid sinus   Did not get any medicines in the ER - got fluids / was a little dehydrated  She got better when she left the ER   Is feeling normal now - just fatigued Does have sinus pressure on the left and ears feel full  No fever    Patient Active Problem List   Diagnosis Date Noted  . Abdominal cramping 09/25/2012  . Vaso-vagal reaction 09/25/2012  . BPPV (benign paroxysmal positional vertigo) 06/15/2012  . Chronic right ear pain 03/16/2012  . Colon cancer screening 01/17/2012  . Anemia 01/17/2012  . Vertigo 11/25/2011  . Gynecological examination 12/11/2010  . GRIEF REACTION 08/01/2009  . UNSPECIFIED VITAMIN D DEFICIENCY 08/19/2008  . ANXIETY 01/29/2008  . HYPERTENSION NEC 09/25/2007  . VITAMIN B12 DEFICIENCY 09/24/2006  . HYPERLIPIDEMIA 09/24/2006  . VENOUS INSUFFICIENCY 09/24/2006  . ALLERGIC RHINITIS 09/24/2006  . GERD 09/24/2006  . OSTEOPOROSIS 09/24/2006   Past Medical History  Diagnosis Date  . GERD (gastroesophageal reflux disease)   . Allergic rhinitis   . Hyperlipidemia   . Osteopenia   . Osteoporosis   . Anxiety   . Hypertension    Past Surgical History  Procedure Laterality Date  . Appendectomy    . Cesarean section    . Cholecystectomy    . Ovarian cyst surgery    . Fracture surgery  07/2008    neck  . Breast surgery  10/10    benign biopsy  negative   History  Substance Use Topics  . Smoking status: Former Smoker    Quit date: 03/11/1968  . Smokeless tobacco: Never Used  . Alcohol Use: No   Family History  Problem Relation Age of Onset  . Hypertension Mother   . Heart disease Father   . Macular degeneration Sister   . Diabetes Maternal Aunt   . Cancer Maternal Aunt     throat   Allergies  Allergen Reactions  . Buspirone Hcl     REACTION: HEART PALPITATIONS  . Codeine     REACTION: nausea and vomiting  . Hydrochlorothiazide     REACTION: syncope-possibly from dehydration  . Lansoprazole     REACTION: abd pain  . Minocycline Hcl   . Penicillins     REACTION: mouth numbness  . Ramipril     Cough   . Sulfa Antibiotics Nausea Only   Current Outpatient Prescriptions on File Prior to Visit  Medication Sig Dispense Refill  . amLODipine (NORVASC) 10 MG tablet Take 1 tablet (10 mg total) by mouth daily.  30 tablet  11  . aspirin (ASPIRIN EC) 81 MG EC tablet Take 81 mg by mouth daily. Swallow whole.      . calcium-vitamin D (OSCAL WITH D) 500-200 MG-UNIT per tablet Take 2 tablets by mouth daily.        Marland Kitchen  cetirizine (ZYRTEC) 10 MG tablet Take 10 mg by mouth as needed.       . Cholecalciferol (VITAMIN D) 2000 UNITS CAPS Take 1 capsule by mouth daily.        . cyanocobalamin (,VITAMIN B-12,) 1000 MCG/ML injection Inject 1 mL (1,000 mcg total) into the muscle every 8 (eight) weeks. One injection every 2 months  1 mL  5  . DimenhyDRINATE (DRAMAMINE) 50 MG CHEW Chew 1 tablet by mouth as needed.        . Famotidine (PEPCID PO) Take 1 tablet by mouth daily as needed.        . ferrous sulfate 325 (65 FE) MG tablet Take 325 mg by mouth daily.      . fluticasone (FLONASE) 50 MCG/ACT nasal spray Place 2 sprays into the nose daily.  16 g  11  . hyoscyamine (LEVSIN SL) 0.125 MG SL tablet Place 1 tablet (0.125 mg total) under the tongue every 6 (six) hours as needed for cramping.  15 tablet  0  . losartan (COZAAR) 100 MG tablet Take 1 tablet (100 mg total) by mouth daily.  30 tablet  11  . meclizine  (ANTIVERT) 25 MG tablet Take 1 tablet (25 mg total) by mouth 3 (three) times daily as needed for dizziness.  30 tablet  1   No current facility-administered medications on file prior to visit.     Review of Systems Review of Systems  Constitutional: Negative for fever, appetite change, fatigue and unexpected weight change.  Eyes: Negative for pain and visual disturbance.  ENT pos for sinus and ear pressure/ neg for purulent nasal discharge Respiratory: Negative for cough and shortness of breath.   Cardiovascular: Negative for cp or palpitations    Gastrointestinal: Negative for nausea, diarrhea and constipation.  Genitourinary: Negative for urgency and frequency.  Skin: Negative for pallor or rash   Neurological: Negative for weakness, light-headedness, numbness and headaches. (no dizziness today) Hematological: Negative for adenopathy. Does not bruise/bleed easily.  Psychiatric/Behavioral: Negative for dysphoric mood. The patient is not nervous/anxious.         Objective:   Physical Exam  Constitutional: She appears well-developed and well-nourished. No distress.  HENT:  Head: Normocephalic and atraumatic.  Right Ear: External ear normal.  Mouth/Throat: Oropharynx is clear and moist. No oropharyngeal exudate.  Nares are injected and congested  Tender ethmoid sinus areas  L TM has baseline perf (wears hearing aide in that ear)  Eyes: Conjunctivae and EOM are normal. Pupils are equal, round, and reactive to light. Right eye exhibits no discharge. Left eye exhibits no discharge. No scleral icterus.  No nystagmus today  Neck: Normal range of motion. Neck supple. Carotid bruit is not present. No thyromegaly present.  Cardiovascular: Normal rate, regular rhythm, normal heart sounds and intact distal pulses.  Exam reveals no gallop.   Pulmonary/Chest: Effort normal and breath sounds normal. No respiratory distress. She has no wheezes. She has no rales.  Abdominal: Soft. Bowel sounds are  normal. She exhibits no abdominal bruit.  Musculoskeletal: She exhibits no edema.  Lymphadenopathy:    She has no cervical adenopathy.  Neurological: She is alert. She has normal reflexes. No cranial nerve deficit or sensory deficit. She exhibits normal muscle tone. Coordination and gait normal.  No cerebellar signs  Skin: Skin is warm and dry. No rash noted. No erythema. No pallor.  Psychiatric: She has a normal mood and affect.          Assessment & Plan:

## 2012-10-27 NOTE — Assessment & Plan Note (Signed)
Evidence of L ethmoid sinusitis on recent CT scan in the ER and dizziness as well as sinus and ear pressure  Will tx with zithromax (pcn and sulfa all)  Update if no imp Disc tx for congestion

## 2012-10-27 NOTE — Patient Instructions (Addendum)
Your vertigo may have been triggered by a sinus infection Take the zithromax as directed  Stay hydrated - make sure to drink water Take meclizine as needed Update me if worse or no improvement

## 2012-11-18 ENCOUNTER — Other Ambulatory Visit: Payer: Self-pay

## 2012-11-18 DIAGNOSIS — Z1231 Encounter for screening mammogram for malignant neoplasm of breast: Secondary | ICD-10-CM

## 2012-12-09 ENCOUNTER — Ambulatory Visit (INDEPENDENT_AMBULATORY_CARE_PROVIDER_SITE_OTHER): Payer: Medicare Other | Admitting: *Deleted

## 2012-12-09 DIAGNOSIS — E538 Deficiency of other specified B group vitamins: Secondary | ICD-10-CM | POA: Diagnosis not present

## 2012-12-09 DIAGNOSIS — Z23 Encounter for immunization: Secondary | ICD-10-CM | POA: Diagnosis not present

## 2012-12-09 MED ORDER — CYANOCOBALAMIN 1000 MCG/ML IJ SOLN
1000.0000 ug | Freq: Once | INTRAMUSCULAR | Status: AC
  Administered 2012-12-09: 1000 ug via INTRAMUSCULAR

## 2012-12-18 ENCOUNTER — Ambulatory Visit: Payer: Self-pay | Admitting: Oncology

## 2012-12-24 ENCOUNTER — Other Ambulatory Visit: Payer: Self-pay

## 2012-12-24 ENCOUNTER — Ambulatory Visit
Admission: RE | Admit: 2012-12-24 | Discharge: 2012-12-24 | Disposition: A | Discharge status: Home / Self Care | Payer: Medicare Other | Source: Ambulatory Visit

## 2012-12-24 ENCOUNTER — Ambulatory Visit: Payer: Medicare Other

## 2012-12-24 DIAGNOSIS — Z1231 Encounter for screening mammogram for malignant neoplasm of breast: Secondary | ICD-10-CM

## 2013-01-01 ENCOUNTER — Encounter: Payer: Self-pay | Admitting: Internal Medicine

## 2013-01-05 ENCOUNTER — Ambulatory Visit (INDEPENDENT_AMBULATORY_CARE_PROVIDER_SITE_OTHER): Payer: Medicare Other | Admitting: Internal Medicine

## 2013-01-05 ENCOUNTER — Ambulatory Visit: Payer: Medicare Other | Admitting: Internal Medicine

## 2013-01-05 ENCOUNTER — Encounter: Payer: Self-pay | Admitting: Internal Medicine

## 2013-01-05 ENCOUNTER — Other Ambulatory Visit (INDEPENDENT_AMBULATORY_CARE_PROVIDER_SITE_OTHER): Payer: Medicare Other

## 2013-01-05 VITALS — BP 118/72 | HR 80 | Ht 61.5 in | Wt 141.4 lb

## 2013-01-05 DIAGNOSIS — D649 Anemia, unspecified: Secondary | ICD-10-CM

## 2013-01-05 DIAGNOSIS — Z1211 Encounter for screening for malignant neoplasm of colon: Secondary | ICD-10-CM

## 2013-01-05 LAB — CBC
HCT: 31.6 % — ABNORMAL LOW (ref 36.0–46.0)
Hemoglobin: 10.6 g/dL — ABNORMAL LOW (ref 12.0–15.0)
MCHC: 33.7 g/dL (ref 30.0–36.0)
MCV: 82.5 fl (ref 78.0–100.0)
Platelets: 236 10*3/uL (ref 150.0–400.0)
RBC: 3.83 Mil/uL — ABNORMAL LOW (ref 3.87–5.11)
RDW: 16.2 % — ABNORMAL HIGH (ref 11.5–14.6)
WBC: 7.9 10*3/uL (ref 4.5–10.5)

## 2013-01-05 LAB — IBC PANEL
Iron: 63 ug/dL (ref 42–145)
Saturation Ratios: 16.3 % — ABNORMAL LOW (ref 20.0–50.0)
Transferrin: 276.1 mg/dL (ref 212.0–360.0)

## 2013-01-05 LAB — FERRITIN: Ferritin: 16.7 ng/mL (ref 10.0–291.0)

## 2013-01-05 NOTE — Patient Instructions (Signed)
Your physician has requested that you go to the basement for the following lab work before leaving today: Iron studies  You have been given a form for [employer], they will contact you to set everything up.                                               We are excited to introduce MyChart, a new best-in-class service that provides you online access to important information in your electronic medical record. We want to make it easier for you to view your health information - all in one secure location - when and where you need it. We expect MyChart will enhance the quality of care and service we provide.  When you register for MyChart, you can:    View your test results.    Request appointments and receive appointment reminders via email.    Request medication renewals.    View your medical history, allergies, medications and immunizations.    Communicate with your physician's office through a password-protected site.    Conveniently print information such as your medication lists.  To find out if MyChart is right for you, please talk to a member of our clinical staff today. We will gladly answer your questions about this free health and wellness tool.  If you are age 77 or older and want a member of your family to have access to your record, you must provide written consent by completing a proxy form available at our office. Please speak to our clinical staff about guidelines regarding accounts for patients younger than age 61.  As you activate your MyChart account and need any technical assistance, please call the MyChart technical support line at [phone]-CHART 5136948827) or email your question to [email or web address]. If you email your question(s), please include your name, a return phone number and the best time to reach you.  If you have non-urgent health-related questions, you can send a message to our office through MyChart at [email or web address]. If you have a medical  emergency, call 911.  Thank you for using MyChart as your new health and wellness resource!   MyChart licensed from [employer],  4540-9811. Patents Pending.

## 2013-01-06 ENCOUNTER — Encounter: Payer: Self-pay | Admitting: Internal Medicine

## 2013-01-06 ENCOUNTER — Telehealth: Payer: Self-pay | Admitting: *Deleted

## 2013-01-06 DIAGNOSIS — D509 Iron deficiency anemia, unspecified: Secondary | ICD-10-CM

## 2013-01-06 NOTE — Telephone Encounter (Signed)
Per Dr Rhea Belton, refer pt to Hem/Onc for IV Iron therapy. Faxed info to [facility], [phone] for Dr Orlie Dakin to review. Informed pt.

## 2013-01-06 NOTE — Progress Notes (Signed)
Subjective:    Patient ID: Jenna Young, female    DOB: [birth year], 79 y.o.   MRN: [id number]  HPI Jenna Young is a 79 year old female with a past medical history of hypertension, hyperlipidemia, vertigo, long-standing anemia who is seen in followup. She is to see seen 3 months ago to evaluate lower abdominal cramping which was felt to be secondary to oral iron intolerance. She returns today with her daughter.  She reports she has been feeling well. She's had no further lower abdominal pain or cramping since discontinuing oral iron. She reports she is eating well. No nausea or vomiting. Bowel movements have been regular for her without blood or melena. She reports a good appetite. No and heartburn. She reports she still does not want colonoscopy unless she absolutely needs it, and asked about the new colon cancer stool test which she heard about recently on the news.  Review of Systems As per history of present illness, otherwise negative  Current Medications, Allergies, Past Medical History, Past Surgical History, Family History and Social History were reviewed in [employer] record.     Objective:   Physical Exam BP 118/72  Pulse 80  Ht 5' 1.5" (1.562 m)  Wt 141 lb 6.4 oz (64.139 kg)  BMI 26.29 kg/m2  SpO2 97% Constitutional: Well-developed and well-nourished. No distress. HEENT: Normocephalic and atraumatic. Oropharynx is clear and moist. No oropharyngeal exudate. Conjunctivae are normal.  No scleral icterus. Cardiovascular: Normal rate, regular rhythm and intact distal pulses.  Pulmonary/chest: Effort normal and breath sounds normal. No wheezing, rales or rhonchi. Abdominal: Soft, nontender, nondistended. Bowel sounds active throughout Extremities: no clubbing, cyanosis, or edema Neurological: Alert and oriented to person place and time. Skin: Skin is warm and dry. No rashes noted. Psychiatric: Normal mood and affect. Behavior is normal.  CBC    Component  Value Date/Time   WBC 7.9 01/05/2013 1448   RBC 3.83* 01/05/2013 1448   HGB 10.6* 01/05/2013 1448   HCT 31.6* 01/05/2013 1448   PLT 236.0 01/05/2013 1448   MCV 82.5 01/05/2013 1448   MCHC 33.7 01/05/2013 1448   RDW 16.2* 01/05/2013 1448   LYMPHSABS 2.1 01/10/2012 0858   MONOABS 0.5 01/10/2012 0858   EOSABS 0.2 01/10/2012 0858   BASOSABS 0.0 01/10/2012 0858    CMP     Component Value Date/Time   NA 136 01/10/2012 0858   K 4.1 01/10/2012 0858   CL 103 01/10/2012 0858   CO2 25 01/10/2012 0858   GLUCOSE 91 01/10/2012 0858   BUN 13 01/10/2012 0858   CREATININE 0.9 01/10/2012 0858   CALCIUM 9.2 01/10/2012 0858   PROT 8.0 01/10/2012 0858   ALBUMIN 3.8 01/10/2012 0858   AST 18 01/10/2012 0858   ALT 15 01/10/2012 0858   ALKPHOS 71 01/10/2012 0858   BILITOT 0.4 01/10/2012 0858   GFRNONAA >60 11/15/2008 0505   GFRAA  Value: >60        The eGFR has been calculated using the MDRD equation. This calculation has not been validated in all clinical situations. eGFR's persistently <60 mL/min signify possible Chronic Kidney Disease. 11/15/2008 0505    Iron/TIBC/Ferritin    Component Value Date/Time   IRON 63 01/05/2013 1448   FERRITIN 16.7 01/05/2013 1448       Assessment & Plan:  79 year old female with a past medical history of hypertension, hyperlipidemia, vertigo, long-standing anemia who is seen in followup.   1.  Lower abd pain -- resolved, felt to be secondary to constipation  caused by oral iron therapy.  2.  Anemia, low iron -- her hemoglobin has trended down 1 g over the last 3 months. Her ferritin is also at the low end of normal which is suggestive of at least a partial iron deficiency. She is intolerant of oral iron and I will refer her for IV iron therapy. See #3  3.  CRC screening -- we have discussed colonoscopy, and she is hesitant and less she knows something therapeutic as necessary such as polypectomy. She asked about Cologuard.  We discussed this test at length today and she is  instructed in proceeding. We will proceed with this test as screening, and she understands that if this test is positive colonoscopy will be indicated and advised. She reports willingness for colonoscopy if this test is positive.

## 2013-01-18 ENCOUNTER — Ambulatory Visit: Payer: Self-pay | Admitting: Oncology

## 2013-01-18 DIAGNOSIS — Z7982 Long term (current) use of aspirin: Secondary | ICD-10-CM | POA: Diagnosis not present

## 2013-01-18 DIAGNOSIS — Z79899 Other long term (current) drug therapy: Secondary | ICD-10-CM | POA: Diagnosis not present

## 2013-01-18 DIAGNOSIS — I10 Essential (primary) hypertension: Secondary | ICD-10-CM | POA: Diagnosis not present

## 2013-01-18 DIAGNOSIS — R42 Dizziness and giddiness: Secondary | ICD-10-CM | POA: Diagnosis not present

## 2013-01-18 DIAGNOSIS — D509 Iron deficiency anemia, unspecified: Secondary | ICD-10-CM | POA: Diagnosis not present

## 2013-01-18 DIAGNOSIS — K219 Gastro-esophageal reflux disease without esophagitis: Secondary | ICD-10-CM | POA: Diagnosis not present

## 2013-01-18 DIAGNOSIS — Z1211 Encounter for screening for malignant neoplasm of colon: Secondary | ICD-10-CM | POA: Diagnosis not present

## 2013-01-18 LAB — CBC CANCER CENTER
Basophil #: 0 x10 3/mm (ref 0.0–0.1)
Basophil %: 0.6 %
Eosinophil #: 0.1 x10 3/mm (ref 0.0–0.7)
Eosinophil %: 1.3 %
HCT: 35.3 % (ref 35.0–47.0)
HGB: 11.5 g/dL — ABNORMAL LOW (ref 12.0–16.0)
Lymphocyte #: 2.5 x10 3/mm (ref 1.0–3.6)
Lymphocyte %: 39.7 %
MCH: 27.1 pg (ref 26.0–34.0)
MCHC: 32.5 g/dL (ref 32.0–36.0)
MCV: 83 fL (ref 80–100)
Monocyte #: 0.5 x10 3/mm (ref 0.2–0.9)
Monocyte %: 7.1 %
Neutrophil #: 3.3 x10 3/mm (ref 1.4–6.5)
Neutrophil %: 51.3 %
Platelet: 264 x10 3/mm (ref 150–440)
RBC: 4.23 10*6/uL (ref 3.80–5.20)
RDW: 16 % — ABNORMAL HIGH (ref 11.5–14.5)
WBC: 6.4 x10 3/mm (ref 3.6–11.0)

## 2013-01-18 LAB — FERRITIN: Ferritin (ARMC): 21 ng/mL (ref 8–388)

## 2013-01-18 LAB — FOLATE: Folic Acid: 19.8 ng/mL (ref 3.1–100.0)

## 2013-01-18 LAB — IRON AND TIBC
Iron Bind.Cap.(Total): 399 ug/dL (ref 250–450)
Iron Saturation: 20 %
Iron: 79 ug/dL (ref 50–170)
Unbound Iron-Bind.Cap.: 320 ug/dL

## 2013-01-18 LAB — LACTATE DEHYDROGENASE: LDH: 168 U/L (ref 81–246)

## 2013-01-19 NOTE — Telephone Encounter (Signed)
Misty Stanley called back and stated they saw the pt today and pt has a f/u appt.

## 2013-01-19 NOTE — Telephone Encounter (Signed)
Never heard from Dr Milinda Cave ofc. Called today and someone will call me back.

## 2013-01-19 NOTE — Telephone Encounter (Signed)
Tried twice to reach someone at [facility] about the referral and no one has called back. Left another message just now.

## 2013-01-20 LAB — PROT IMMUNOELECTROPHORES(ARMC)

## 2013-01-26 ENCOUNTER — Telehealth: Payer: Self-pay | Admitting: Family Medicine

## 2013-01-26 DIAGNOSIS — E538 Deficiency of other specified B group vitamins: Secondary | ICD-10-CM

## 2013-01-26 DIAGNOSIS — D649 Anemia, unspecified: Secondary | ICD-10-CM

## 2013-01-26 DIAGNOSIS — M810 Age-related osteoporosis without current pathological fracture: Secondary | ICD-10-CM

## 2013-01-26 DIAGNOSIS — E559 Vitamin D deficiency, unspecified: Secondary | ICD-10-CM

## 2013-01-26 DIAGNOSIS — IMO0002 Reserved for concepts with insufficient information to code with codable children: Secondary | ICD-10-CM

## 2013-01-26 DIAGNOSIS — E785 Hyperlipidemia, unspecified: Secondary | ICD-10-CM

## 2013-01-26 NOTE — Telephone Encounter (Signed)
Message copied by Judy Pimple on Tue Jan 26, 2013  8:01 AM ------      Message from: Alvina Chou      Created: Wed Jan 20, 2013  3:03 PM      Regarding: Lab orders for Wednesday 11.19.14       Patient is scheduled for CPX labs, please order future labs, Thanks , Terri       ------

## 2013-01-27 ENCOUNTER — Other Ambulatory Visit (INDEPENDENT_AMBULATORY_CARE_PROVIDER_SITE_OTHER): Payer: Medicare Other

## 2013-01-27 DIAGNOSIS — E538 Deficiency of other specified B group vitamins: Secondary | ICD-10-CM | POA: Diagnosis not present

## 2013-01-27 DIAGNOSIS — E559 Vitamin D deficiency, unspecified: Secondary | ICD-10-CM

## 2013-01-27 DIAGNOSIS — D649 Anemia, unspecified: Secondary | ICD-10-CM | POA: Diagnosis not present

## 2013-01-27 DIAGNOSIS — IMO0002 Reserved for concepts with insufficient information to code with codable children: Secondary | ICD-10-CM | POA: Diagnosis not present

## 2013-01-27 DIAGNOSIS — E785 Hyperlipidemia, unspecified: Secondary | ICD-10-CM | POA: Diagnosis not present

## 2013-01-27 DIAGNOSIS — M810 Age-related osteoporosis without current pathological fracture: Secondary | ICD-10-CM

## 2013-01-27 LAB — LDL CHOLESTEROL, DIRECT: Direct LDL: 116.6 mg/dL

## 2013-01-27 LAB — LIPID PANEL
Cholesterol: 207 mg/dL — ABNORMAL HIGH (ref 0–200)
HDL: 59.8 mg/dL (ref >39.00)
Total CHOL/HDL Ratio: 3
Triglycerides: 123 mg/dL (ref 0.0–149.0)
VLDL: 24.6 mg/dL (ref 0.0–40.0)

## 2013-01-27 LAB — COMPREHENSIVE METABOLIC PANEL
ALT: 13 U/L (ref 0–35)
AST: 19 U/L (ref 0–37)
Albumin: 4.2 g/dL (ref 3.5–5.2)
Alkaline Phosphatase: 65 U/L (ref 39–117)
BUN: 14 mg/dL (ref 6–23)
CO2: 23 mEq/L (ref 19–32)
Calcium: 9.6 mg/dL (ref 8.4–10.5)
Chloride: 106 mEq/L (ref 96–112)
Creatinine, Ser: 0.8 mg/dL (ref 0.4–1.2)
GFR: 72.49 mL/min (ref >60.00)
Glucose, Bld: 101 mg/dL — ABNORMAL HIGH (ref 70–99)
Potassium: 4.3 mEq/L (ref 3.5–5.1)
Sodium: 137 mEq/L (ref 135–145)
Total Bilirubin: 0.6 mg/dL (ref 0.3–1.2)
Total Protein: 8.4 g/dL — ABNORMAL HIGH (ref 6.0–8.3)

## 2013-01-27 LAB — CBC WITH DIFFERENTIAL/PLATELET
Basophils Absolute: 0 10*3/uL (ref 0.0–0.1)
Basophils Relative: 0.7 % (ref 0.0–3.0)
Eosinophils Absolute: 0.1 10*3/uL (ref 0.0–0.7)
Eosinophils Relative: 2 % (ref 0.0–5.0)
HCT: 33.2 % — ABNORMAL LOW (ref 36.0–46.0)
Hemoglobin: 11.2 g/dL — ABNORMAL LOW (ref 12.0–15.0)
Lymphocytes Relative: 42.6 % (ref 12.0–46.0)
Lymphs Abs: 2.6 10*3/uL (ref 0.7–4.0)
MCHC: 33.8 g/dL (ref 30.0–36.0)
MCV: 82.6 fl (ref 78.0–100.0)
Monocytes Absolute: 0.4 10*3/uL (ref 0.1–1.0)
Monocytes Relative: 6.9 % (ref 3.0–12.0)
Neutro Abs: 2.9 10*3/uL (ref 1.4–7.7)
Neutrophils Relative %: 47.8 % (ref 43.0–77.0)
Platelets: 237 10*3/uL (ref 150.0–400.0)
RBC: 4.01 Mil/uL (ref 3.87–5.11)
RDW: 16.5 % — ABNORMAL HIGH (ref 11.5–14.6)
WBC: 6 10*3/uL (ref 4.5–10.5)

## 2013-01-27 LAB — TSH: TSH: 1.74 u[IU]/mL (ref 0.35–5.50)

## 2013-01-27 LAB — VITAMIN B12: Vitamin B-12: 321 pg/mL (ref 211–911)

## 2013-01-28 LAB — VITAMIN D 25 HYDROXY (VIT D DEFICIENCY, FRACTURES): Vit D, 25-Hydroxy: 40 ng/mL (ref 30–89)

## 2013-02-02 ENCOUNTER — Encounter: Payer: Self-pay | Admitting: Family Medicine

## 2013-02-02 ENCOUNTER — Ambulatory Visit (INDEPENDENT_AMBULATORY_CARE_PROVIDER_SITE_OTHER): Payer: Medicare Other | Admitting: Family Medicine

## 2013-02-02 VITALS — BP 130/64 | HR 80 | Temp 97.7°F | Ht 62.0 in | Wt 138.8 lb

## 2013-02-02 DIAGNOSIS — M810 Age-related osteoporosis without current pathological fracture: Secondary | ICD-10-CM | POA: Diagnosis not present

## 2013-02-02 DIAGNOSIS — E538 Deficiency of other specified B group vitamins: Secondary | ICD-10-CM | POA: Diagnosis not present

## 2013-02-02 DIAGNOSIS — Z0000 Encounter for general adult medical examination without abnormal findings: Secondary | ICD-10-CM

## 2013-02-02 DIAGNOSIS — E559 Vitamin D deficiency, unspecified: Secondary | ICD-10-CM

## 2013-02-02 DIAGNOSIS — E785 Hyperlipidemia, unspecified: Secondary | ICD-10-CM

## 2013-02-02 DIAGNOSIS — IMO0002 Reserved for concepts with insufficient information to code with codable children: Secondary | ICD-10-CM

## 2013-02-02 MED ORDER — CYANOCOBALAMIN 1000 MCG/ML IJ SOLN
1000.0000 ug | Freq: Once | INTRAMUSCULAR | Status: AC
  Administered 2013-02-02: 1000 ug via INTRAMUSCULAR

## 2013-02-02 MED ORDER — FLUTICASONE PROPIONATE 50 MCG/ACT NA SUSP: 2.0000 | Freq: Every day | NASAL | Status: DC

## 2013-02-02 MED ORDER — AMLODIPINE BESYLATE 10 MG PO TABS: 10.0000 mg | ORAL_TABLET | Freq: Every day | ORAL | Status: DC

## 2013-02-02 MED ORDER — LOSARTAN POTASSIUM 100 MG PO TABS: 100.0000 mg | ORAL_TABLET | Freq: Every day | ORAL | Status: DC

## 2013-02-02 NOTE — Patient Instructions (Addendum)
Don't forget to work on a living will - it is important  Take care of yourself  Keep exercising  Keep socializing  B12 shot today

## 2013-02-02 NOTE — Progress Notes (Signed)
Subjective:    Patient ID: Jenna Young, female    DOB: [birth year], 79 y.o.   MRN: [id number]  HPI I have personally reviewed the Medicare Annual Wellness questionnaire and have noted 1. The patient's medical and social history 2. Their use of alcohol, tobacco or illicit drugs 3. Their current medications and supplements 4. The patient's functional ability including ADL's, fall risks, home safety risks and hearing or visual             impairment. 5. Diet and physical activities 6. Evidence for depression or mood disorders  The patients weight, height, BMI have been recorded in the chart and visual acuity is per eye clinic.  I have made referrals, counseling and provided education to the patient based review of the above and I have provided the pt with a written personalized care plan for preventive services.  Feeling good overall  Nothing new to report   See scanned forms.  Routine anticipatory guidance given to patient.  See health maintenance. Flu vaccine 10/14 Shingles status - not interested in vaccine  PNA vaccine 10/12  Tetanus 1/09 vaccine  Colonoscopy- declined last year - and has done  ifob 11/13  -- is doing something new for colon screening from Dr Rhea Belton this year -- will update  Breast cancer screening mammogram 10/14 No lumps on self exam  Advance directive- she does not have a living will -has the papers to work on that  Cognitive function addressed- see scanned forms- and if abnormal then additional documentation follows. -no major memory problems   PMH and SH reviewed  Meds, vitals, and allergies reviewed.   ROS: See HPI.  Otherwise negative.    Not depressed - does get lonely Goes to the gym 3 times per week- social Also going on a church trip  bp is stable today  No cp or palpitations or headaches or edema  No side effects to medicines  BP Readings from Last 3 Encounters:  02/02/13 130/64  01/05/13 118/72  10/27/12 122/78   .  Hx of B12 def Due  for shot next week but will get today--needs to continue these  Lab Results  Component Value Date   VITAMINB12 321 01/27/2013      Chemistry      Component Value Date/Time   NA 137 01/27/2013 0905   K 4.3 01/27/2013 0905   CL 106 01/27/2013 0905   CO2 23 01/27/2013 0905   BUN 14 01/27/2013 0905   CREATININE 0.8 01/27/2013 0905      Component Value Date/Time   CALCIUM 9.6 01/27/2013 0905   ALKPHOS 65 01/27/2013 0905   AST 19 01/27/2013 0905   ALT 13 01/27/2013 0905   BILITOT 0.6 01/27/2013 0905      Lab Results  Component Value Date   WBC 6.0 01/27/2013   HGB 11.2* 01/27/2013   HCT 33.2* 01/27/2013   MCV 82.6 01/27/2013   PLT 237.0 01/27/2013    Lab Results  Component Value Date   TSH 1.74 01/27/2013     Lab Results  Component Value Date   CHOL 207* 01/27/2013   CHOL 194 01/10/2012   CHOL 209* 12/04/2010   Lab Results  Component Value Date   HDL 59.80 01/27/2013   HDL 56.80 01/10/2012   HDL 78.29 12/04/2010   Lab Results  Component Value Date   LDLCALC 119* 01/10/2012   LDLCALC  Value: 123        Total Cholesterol/HDL:CHD Risk Coronary Heart Disease Risk Table  Men   Women  1/2 Average Risk   3.4   3.3  Average Risk       5.0   4.4  2 X Average Risk   9.6   7.1  3 X Average Risk  23.4   11.0        Use the calculated Patient Ratio above and the CHD Risk Table to determine the patient's CHD Risk.        ATP III CLASSIFICATION (LDL):  <100     mg/dL   Optimal  161-096  mg/dL   Near or Above                    Optimal  130-159  mg/dL   Borderline  045-409  mg/dL   High  >811     mg/dL   Very High* 11/09/4780   Lab Results  Component Value Date   TRIG 123.0 01/27/2013   TRIG 93.0 01/10/2012   TRIG 147.0 12/04/2010   Lab Results  Component Value Date   CHOLHDL 3 01/27/2013   CHOLHDL 3 01/10/2012   CHOLHDL 3 12/04/2010   Lab Results  Component Value Date   LDLDIRECT 116.6 01/27/2013   LDLDIRECT 114.5 12/04/2010   LDLDIRECT 117.1 04/23/2010    profile is pretty low risk and pretty stable on a healthy diet No cp or other symptoms Takes one asa daily 81 mg   Patient Active Problem List   Diagnosis Date Noted  . Acute ethmoidal sinusitis 10/27/2012  . Abdominal cramping 09/25/2012  . Vaso-vagal reaction 09/25/2012  . BPPV (benign paroxysmal positional vertigo) 06/15/2012  . Chronic right ear pain 03/16/2012  . Colon cancer screening 01/17/2012  . Anemia 01/17/2012  . Vertigo 11/25/2011  . Gynecological examination 12/11/2010  . GRIEF REACTION 08/01/2009  . UNSPECIFIED VITAMIN D DEFICIENCY 08/19/2008  . ANXIETY 01/29/2008  . HYPERTENSION NEC 09/25/2007  . VITAMIN B12 DEFICIENCY 09/24/2006  . HYPERLIPIDEMIA 09/24/2006  . VENOUS INSUFFICIENCY 09/24/2006  . ALLERGIC RHINITIS 09/24/2006  . GERD 09/24/2006  . OSTEOPOROSIS 09/24/2006   Past Medical History  Diagnosis Date  . GERD (gastroesophageal reflux disease)   . Allergic rhinitis   . Hyperlipidemia   . Osteopenia   . Osteoporosis   . Anxiety   . Hypertension    Past Surgical History  Procedure Laterality Date  . Appendectomy    . Cesarean section    . Cholecystectomy    . Ovarian cyst surgery    . Fracture surgery  07/2008    neck  . Breast surgery  10/10    benign biopsy  negative   History  Substance Use Topics  . Smoking status: Former Smoker    Quit date: 03/11/1968  . Smokeless tobacco: Never Used  . Alcohol Use: No   Family History  Problem Relation Age of Onset  . Hypertension Mother   . Heart disease Father   . Macular degeneration Sister   . Diabetes Maternal Aunt   . Cancer Maternal Aunt     throat   Allergies  Allergen Reactions  . Buspirone Hcl     REACTION: HEART PALPITATIONS  . Codeine     REACTION: nausea and vomiting  . Hydrochlorothiazide     REACTION: syncope-possibly from dehydration  . Lansoprazole     REACTION: abd pain  . Minocycline Hcl   . Penicillins     REACTION: mouth numbness  . Ramipril     Cough   .  Sulfa Antibiotics Nausea  Only   Current Outpatient Prescriptions on File Prior to Visit  Medication Sig Dispense Refill  . amLODipine (NORVASC) 10 MG tablet Take 1 tablet (10 mg total) by mouth daily.  30 tablet  11  . aspirin (ASPIRIN EC) 81 MG EC tablet Take 81 mg by mouth daily. Swallow whole.      . calcium-vitamin D (OSCAL WITH D) 500-200 MG-UNIT per tablet Take 2 tablets by mouth daily.        . cetirizine (ZYRTEC) 10 MG tablet Take 10 mg by mouth as needed.       . Cholecalciferol (VITAMIN D) 2000 UNITS CAPS Take 1 capsule by mouth daily.        . cyanocobalamin (,VITAMIN B-12,) 1000 MCG/ML injection Inject 1 mL (1,000 mcg total) into the muscle every 8 (eight) weeks. One injection every 2 months  1 mL  5  . DimenhyDRINATE (DRAMAMINE) 50 MG CHEW Chew 1 tablet by mouth as needed.        . Famotidine (PEPCID PO) Take 1 tablet by mouth daily as needed.        . fluticasone (FLONASE) 50 MCG/ACT nasal spray Place 2 sprays into the nose daily.  16 g  11  . hyoscyamine (LEVSIN SL) 0.125 MG SL tablet Place 1 tablet (0.125 mg total) under the tongue every 6 (six) hours as needed for cramping.  15 tablet  0  . meclizine (ANTIVERT) 25 MG tablet Take 1 tablet (25 mg total) by mouth 3 (three) times daily as needed for dizziness.  30 tablet  1  . losartan (COZAAR) 100 MG tablet Take 1 tablet (100 mg total) by mouth daily.  30 tablet  11   No current facility-administered medications on file prior to visit.     Review of Systems Review of Systems  Constitutional: Negative for fever, appetite change, fatigue and unexpected weight change.  Eyes: Negative for pain and visual disturbance.  Respiratory: Negative for cough and shortness of breath.   Cardiovascular: Negative for cp or palpitations    Gastrointestinal: Negative for nausea, diarrhea and constipation.  Genitourinary: Negative for urgency and frequency.  Skin: Negative for pallor or rash   Neurological: Negative for weakness,  light-headedness, numbness and headaches.  Hematological: Negative for adenopathy. Does not bruise/bleed easily.  Psychiatric/Behavioral: Negative for dysphoric mood. The patient is not nervous/anxious.         Objective:   Physical Exam  Constitutional: She appears well-developed and well-nourished. No distress.  HENT:  Head: Normocephalic and atraumatic.  Right Ear: External ear normal.  Left Ear: External ear normal.  Mouth/Throat: Oropharynx is clear and moist.  Eyes: Conjunctivae and EOM are normal. Pupils are equal, round, and reactive to light. No scleral icterus.  Neck: Normal range of motion. Neck supple. No JVD present. Carotid bruit is not present. No thyromegaly present.  Cardiovascular: Normal rate, regular rhythm, normal heart sounds and intact distal pulses.  Exam reveals no gallop.   Pulmonary/Chest: Effort normal and breath sounds normal. No respiratory distress. She has no wheezes. She exhibits no tenderness.  Abdominal: Soft. Bowel sounds are normal. She exhibits no distension, no abdominal bruit and no mass. There is no tenderness.  Genitourinary: No breast swelling, tenderness, discharge or bleeding.  Breast exam: No mass, nodules, thickening, tenderness, bulging, retraction, inflamation, nipple discharge or skin changes noted.  No axillary or clavicular LA.    Musculoskeletal: Normal range of motion. She exhibits no edema and no tenderness.  Lymphadenopathy:    She  has no cervical adenopathy.  Neurological: She is alert. She has normal reflexes. No cranial nerve deficit. She exhibits normal muscle tone. Coordination normal.  Skin: Skin is warm and dry. No rash noted. No erythema. No pallor.  Some SKs  Nevi on arms -stable   Varicosities on ankles   Psychiatric: She has a normal mood and affect.          Assessment & Plan:

## 2013-02-03 ENCOUNTER — Telehealth: Payer: Self-pay | Admitting: Gastroenterology

## 2013-02-03 DIAGNOSIS — Z1211 Encounter for screening for malignant neoplasm of colon: Secondary | ICD-10-CM

## 2013-02-03 DIAGNOSIS — Z0000 Encounter for general adult medical examination without abnormal findings: Secondary | ICD-10-CM | POA: Insufficient documentation

## 2013-02-03 MED ORDER — MOVIPREP 100 G PO SOLR: ORAL | Status: DC

## 2013-02-03 NOTE — Assessment & Plan Note (Signed)
Reviewed health habits including diet and exercise and skin cancer prevention Reviewed appropriate screening tests for age  Also reviewed health mt list, fam hx and immunization status , as well as social and family history   See HPI Rev wellness labs and habits

## 2013-02-03 NOTE — Assessment & Plan Note (Signed)
Disc goals for lipids and reasons to control them Rev labs with pt Rev low sat fat diet in detail Diet controlled  

## 2013-02-03 NOTE — Assessment & Plan Note (Signed)
Continues to decline further dexa  No falls or fx  On ca and D

## 2013-02-03 NOTE — Assessment & Plan Note (Signed)
Lab Results  Component Value Date   VITAMINB12 321 01/27/2013   Shot today

## 2013-02-03 NOTE — Assessment & Plan Note (Signed)
D level is not on the 40s with current suppl  Declines dexa Disc imp to bone and overall health

## 2013-02-03 NOTE — Telephone Encounter (Signed)
Spoke to pt gave her results of cologuard test which came back positive. Scheduled colonoscopy on 03/08/2013 in 1 hour slot per Dr. Sherald Barge request. Told pt I will mail her out instructions and when she receives them to call me to go over them. Pt verbalized understanding.

## 2013-02-03 NOTE — Assessment & Plan Note (Signed)
bp in fair control at this time  No changes needed Disc lifstyle change with low sodium diet and exercise  BP: 130/64 mmHg  Lab reviewed

## 2013-02-08 ENCOUNTER — Ambulatory Visit: Payer: Self-pay | Admitting: Oncology

## 2013-02-09 ENCOUNTER — Ambulatory Visit: Payer: Medicare Other

## 2013-02-11 ENCOUNTER — Telehealth: Payer: Self-pay | Admitting: Family Medicine

## 2013-02-11 NOTE — Telephone Encounter (Signed)
Spoke with patient and gave suggestions. He BP now is 152/68. She has no symptoms-only the elevated BP. I scheduled follow up for next Monday, but advised patient if BP doesn't continue to come down, if it spikes again or if she develops dizziness or chest pain to come in tomorrow or go to [facility]/ ER. She verbalized understanding.

## 2013-02-11 NOTE — Telephone Encounter (Addendum)
I would have her rest over next few hours and recheck blood pressure this afternoon or evening.  Make sure to drink plenty of water.  If persistently elevated bp, recommend she come in for office visit.  If persistent dizziness or HA or chest pain, come in sooner. BP Readings from Last 3 Encounters:  02/02/13 130/64  01/05/13 118/72  10/27/12 122/78   Past Medical History  Diagnosis Date  . GERD (gastroesophageal reflux disease)   . Allergic rhinitis   . Hyperlipidemia   . Osteopenia   . Osteoporosis   . Anxiety   . Hypertension

## 2013-02-11 NOTE — Telephone Encounter (Signed)
Patient Information:  Caller Name: Janifer  Phone: [phone]  Patient: Jenna Young, Jenna Young  Gender: Female  DOB: May 12, 1931  Age: 79 Years  PCP: [facility] [employer] [facility])  Office Follow Up:  Does the office need to follow up with this patient?: Yes  Instructions For The Office: Concerned about spike in blood pressure with bout of dizziness.  Encouraged caller to keep Blood pressure log.  She no longer feels dizzy after treatment of meclazine but admits to being nervous.  She denies palpatations/chest pain or shortness of breath.  Please review and advise  RN Note:  Concerned about spike in blood pressure with bout of dizziness.  Encouraged caller to keep Blood pressure log.  She no longer feels dizzy after treatment of meclazine but admits to being nervous.  She denies palpatations/chest pain or shortness of breath.  Please review and advise.  Symptoms  Reason For Call & Symptoms: Patient states "all of sudden felt dizzy"  but has vertigo.  she took her medication for vertigo and she feels better.  She decided to take  Her Blood pressure 180/66 and rechecked 173/65.  She takes blood pressure medication (norvasc and Losartan)  and is compliant.  Had patient recheck blood pressure during triage- 160/68  Pulse 107.  Patient denies palpatations.  Reviewed Health History In EMR: Yes  Reviewed Medications In EMR: Yes  Reviewed Allergies In EMR: Yes  Reviewed Surgeries / Procedures: Yes  Date of Onset of Symptoms: 02/11/2013  Guideline(s) Used:  High Blood Pressure  Dizziness  Disposition Per Guideline:   See Within 2 Weeks in Office  Reason For Disposition Reached:   BP > 140/90 and is taking BP medications  Advice Given:  Lifestyle Changes  Maintain a healthy weight. Lose weight if you are overweight.  Eat a diet high in fresh fruits and low-fat dairy products. Limit your intake of saturated and total fat. Choose foods that are lower in salt.  If you smoke, you should stop.  If you  drink alcohol, you should limit your daily alcohol drinking. Women should have no more than one drink per day. Men should have no more than 2 drinks per day. A drink is defined as 1.5 oz hard liquor (one shot or jigger; 45 ml), 5 oz wine (small glass; 150 ml), or 12 oz beer (one can; 360 ml).  Call Back If:  Headache, blurred vision, difficulty talking, or difficulty walking occurs  Chest pain or difficulty breathing occurs  You become worse.  Some Causes of Temporary Dizziness:  Poor Fluid Intake - Not drinking enough fluids and being a little dehydrated is a common cause of temporary dizziness. This is always worse during hot weather.  Standing Up Suddenly - Standing up suddenly (especially getting out of bed) or prolonged standing in one place are common causes of temporary dizziness. Not drinking enough fluids always makes it worse. Certain medications can cause or increase this type of dizziness (e.g., blood pressure medications).  Drink Fluids:  Drink several glasses of fruit juice, other clear fluids, or water. This will improve hydration and blood glucose. If you have a fever or have had heat exposure, make sure the fluids are cold.  Rest for 1-2 Hours:  Lie down with feet elevated for 1 hour. This will improve blood flow and increase blood flow to the brain.  Call Back If:  Headache, blurred vision, difficulty talking, or difficulty walking occurs  Chest pain or difficulty breathing occurs  You become worse.  RN  Overrode Recommendation:  Make Appointment  Concerned about spike in blood pressure with bout of dizziness.  Encouraged caller to keep Blood pressure log.  She no longer feels dizzy after treatment of meclazine but admits to being nervous.  She denies palpatations/chest pain or shortness of breath.  Please review and advise

## 2013-02-12 NOTE — Telephone Encounter (Signed)
I will see her on Monday unless condition worsens

## 2013-02-15 ENCOUNTER — Encounter: Payer: Self-pay | Admitting: Family Medicine

## 2013-02-15 ENCOUNTER — Ambulatory Visit (INDEPENDENT_AMBULATORY_CARE_PROVIDER_SITE_OTHER): Payer: Medicare Other | Admitting: Family Medicine

## 2013-02-15 VITALS — BP 135/60 | HR 83 | Temp 97.9°F | Ht 62.75 in | Wt 137.0 lb

## 2013-02-15 DIAGNOSIS — IMO0002 Reserved for concepts with insufficient information to code with codable children: Secondary | ICD-10-CM

## 2013-02-15 NOTE — Assessment & Plan Note (Addendum)
BP Readings from Last 3 Encounters:  02/15/13 135/60  02/02/13 130/64  01/05/13 118/72   Although high at home-this remains well controlled here Spike correlated with a time of nervousness/ anxiety inst to check bp when very relaxed -will follow closely She declined further tx of anxiety or counseling

## 2013-02-15 NOTE — Progress Notes (Signed)
Pre visit review using our clinic review tool, if applicable. No additional management support is needed unless otherwise documented below in the visit note. 

## 2013-02-15 NOTE — Patient Instructions (Signed)
Your  Blood pressure is much much better here I think it went up due to anxiety If you ever want to see a counselor for anxiety let me know Only check your blood pressure when you feel relaxed -never when nervous or worried  We will keep an eye on this   DASH Diet The DASH diet stands for "Dietary Approaches to Stop Hypertension." It is a healthy eating plan that has been shown to reduce high blood pressure (hypertension) in as little as 14 days, while also possibly providing other significant health benefits. These other health benefits include reducing the risk of breast cancer after menopause and reducing the risk of type 2 diabetes, heart disease, colon cancer, and stroke. Health benefits also include weight loss and slowing kidney failure in patients with chronic kidney disease.  DIET GUIDELINES  Limit salt (sodium). Your diet should contain less than 1500 mg of sodium daily.  Limit refined or processed carbohydrates. Your diet should include mostly whole grains. Desserts and added sugars should be used sparingly.  Include small amounts of heart-healthy fats. These types of fats include nuts, oils, and tub margarine. Limit saturated and trans fats. These fats have been shown to be harmful in the body. CHOOSING FOODS  The following food groups are based on a 2000 calorie diet. See your Registered Dietitian for individual calorie needs. Grains and Grain Products (6 to 8 servings daily)  Eat More Often: Whole-wheat bread, brown rice, whole-grain or wheat pasta, quinoa, popcorn without added fat or salt (air popped).  Eat Less Often: White bread, white pasta, white rice, cornbread. Vegetables (4 to 5 servings daily)  Eat More Often: Fresh, frozen, and canned vegetables. Vegetables may be raw, steamed, roasted, or grilled with a minimal amount of fat.  Eat Less Often/Avoid: Creamed or fried vegetables. Vegetables in a cheese sauce. Fruit (4 to 5 servings daily)  Eat More Often: All  fresh, canned (in natural juice), or frozen fruits. Dried fruits without added sugar. One hundred percent fruit juice ( cup [237 mL] daily).  Eat Less Often: Dried fruits with added sugar. Canned fruit in light or heavy syrup. [employer], Fish, and Poultry (2 servings or less daily. One serving is 3 to 4 oz [85-114 g]).  Eat More Often: Ninety percent or leaner ground beef, tenderloin, sirloin. Round cuts of beef, chicken breast, [place] breast. All fish. Grill, bake, or broil your meat. Nothing should be fried.  Eat Less Often/Avoid: Fatty cuts of meat, [place], or chicken leg, thigh, or wing. Fried cuts of meat or fish. Dairy (2 to 3 servings)  Eat More Often: Low-fat or fat-free milk, low-fat plain or light yogurt, reduced-fat or part-skim cheese.  Eat Less Often/Avoid: Milk (whole, 2%).Whole milk yogurt. Full-fat cheeses. Nuts, Seeds, and Legumes (4 to 5 servings per week)  Eat More Often: All without added salt.  Eat Less Often/Avoid: Salted nuts and seeds, canned beans with added salt. Fats and Sweets (limited)  Eat More Often: Vegetable oils, tub margarines without trans fats, sugar-free gelatin. Mayonnaise and salad dressings.  Eat Less Often/Avoid: Coconut oils, palm oils, butter, stick margarine, cream, half and half, cookies, candy, pie. FOR MORE INFORMATION The Dash Diet Eating Plan: www.dashdiet.org Document Released: 02/14/2011 Document Revised: 05/20/2011 Document Reviewed: 02/14/2011 [facility] Patient Information 2014 [place], [place].

## 2013-02-15 NOTE — Progress Notes (Signed)
   Subjective:    Patient ID: Jenna Young, female    DOB: 12-21-1931, 79 y.o.   MRN: [id number]  HPI Here for HTN  Her bp went way upon Thursday and it went as high as 180/66= then 130s -- was very worried about upcoming colonoscopy -- was told her screening is pos and needs to do this Has never done one before   No bowel problems or abd pain or blood in stool  BP Readings from Last 3 Encounters:  02/15/13 134/62  02/02/13 130/64  01/05/13 118/72    She has not missed any medicines  No ha or dizziness     Chemistry      Component Value Date/Time   NA 137 01/27/2013 0905   K 4.3 01/27/2013 0905   CL 106 01/27/2013 0905   CO2 23 01/27/2013 0905   BUN 14 01/27/2013 0905   CREATININE 0.8 01/27/2013 0905      Component Value Date/Time   CALCIUM 9.6 01/27/2013 0905   ALKPHOS 65 01/27/2013 0905   AST 19 01/27/2013 0905   ALT 13 01/27/2013 0905   BILITOT 0.6 01/27/2013 0905         Review of Systems    Review of Systems  Constitutional: Negative for fever, appetite change, fatigue and unexpected weight change.  Eyes: Negative for pain and visual disturbance.  Respiratory: Negative for cough and shortness of breath.   Cardiovascular: Negative for cp or palpitations    Gastrointestinal: Negative for nausea, diarrhea and constipation.  Genitourinary: Negative for urgency and frequency.  Skin: Negative for pallor or rash   Neurological: Negative for weakness, light-headedness, numbness and headaches.  Hematological: Negative for adenopathy. Does not bruise/bleed easily.  Psychiatric/Behavioral: Negative for dysphoric mood. The patient is very nervous/anxious.      Objective:   Physical Exam  Constitutional: She appears well-developed and well-nourished. No distress.  HENT:  Head: Normocephalic and atraumatic.  Right Ear: External ear normal.  Left Ear: External ear normal.  Mouth/Throat: Oropharynx is clear and moist.  Eyes: Conjunctivae and EOM are normal. Pupils  are equal, round, and reactive to light. No scleral icterus.  Neck: Normal range of motion. Neck supple. No JVD present. Carotid bruit is not present. No thyromegaly present.  Cardiovascular: Normal rate, regular rhythm, normal heart sounds and intact distal pulses.  Exam reveals no gallop.   Pulmonary/Chest: Effort normal and breath sounds normal. No respiratory distress. She has no wheezes. She exhibits no tenderness.  Abdominal: Soft. Bowel sounds are normal. She exhibits no distension, no abdominal bruit and no mass. There is no tenderness.  Musculoskeletal: Normal range of motion. She exhibits no edema and no tenderness.  Lymphadenopathy:    She has no cervical adenopathy.  Neurological: She is alert. She has normal reflexes. No cranial nerve deficit. She exhibits normal muscle tone. Coordination normal.  Skin: Skin is warm and dry. No rash noted. No erythema. No pallor.  Psychiatric: Her mood appears anxious.  A little tearful Relaxes at end of the visit          Assessment & Plan:

## 2013-02-17 ENCOUNTER — Encounter: Payer: Self-pay | Admitting: Internal Medicine

## 2013-03-08 ENCOUNTER — Ambulatory Visit (AMBULATORY_SURGERY_CENTER): Payer: Medicare Other | Admitting: Internal Medicine

## 2013-03-08 ENCOUNTER — Encounter: Payer: Self-pay | Admitting: Internal Medicine

## 2013-03-08 VITALS — BP 128/59 | HR 74 | Temp 97.8°F | Resp 21 | Ht 62.75 in | Wt 137.0 lb

## 2013-03-08 DIAGNOSIS — D649 Anemia, unspecified: Secondary | ICD-10-CM | POA: Diagnosis not present

## 2013-03-08 DIAGNOSIS — E785 Hyperlipidemia, unspecified: Secondary | ICD-10-CM | POA: Diagnosis not present

## 2013-03-08 DIAGNOSIS — D126 Benign neoplasm of colon, unspecified: Secondary | ICD-10-CM

## 2013-03-08 DIAGNOSIS — K219 Gastro-esophageal reflux disease without esophagitis: Secondary | ICD-10-CM | POA: Diagnosis not present

## 2013-03-08 DIAGNOSIS — Z1211 Encounter for screening for malignant neoplasm of colon: Secondary | ICD-10-CM | POA: Diagnosis not present

## 2013-03-08 DIAGNOSIS — M810 Age-related osteoporosis without current pathological fracture: Secondary | ICD-10-CM | POA: Diagnosis not present

## 2013-03-08 DIAGNOSIS — R195 Other fecal abnormalities: Secondary | ICD-10-CM | POA: Diagnosis not present

## 2013-03-08 DIAGNOSIS — I10 Essential (primary) hypertension: Secondary | ICD-10-CM | POA: Diagnosis not present

## 2013-03-08 MED ORDER — SODIUM CHLORIDE 0.9 % IV SOLN: 500.0000 mL | INTRAVENOUS | Status: DC

## 2013-03-08 NOTE — Progress Notes (Signed)
Pt stable to RR 

## 2013-03-08 NOTE — Op Note (Signed)
[facility] [address] [place], [address]   COLONOSCOPY PROCEDURE REPORT  PATIENT: Jenna Young, Jenna Young  MR#: [id number] BIRTHDATE: Jan 04, 1932 , 81  yrs. old GENDER: Female ENDOSCOPIST: Beverley Fiedler, MD PROCEDURE DATE:  03/08/2013 PROCEDURE:   Colonoscopy with snare polypectomy and Colonoscopy with cold biopsy polypectomy First Screening Colonoscopy - Avg.  risk and is 50 yrs.  old or older - No.  Prior Negative Screening - Now for repeat screening. N/A  History of Adenoma - Now for follow-up colonoscopy & has been > or = to 3 yrs.  N/A  Polyps Removed Today? Yes. ASA CLASS:   Class III INDICATIONS:Colon cancer screening, positive Cologuard. MEDICATIONS: MAC sedation, administered by CRNA and propofol (Diprivan) 300mg  IV  DESCRIPTION OF PROCEDURE:   After the risks benefits and alternatives of the procedure were thoroughly explained, informed consent was obtained.  A digital rectal exam revealed no rectal mass.   The LB [id number]-[id number]  endoscope was introduced through the anus and advanced to the cecum, which was identified by both the appendix and ileocecal valve. No adverse events experienced. The quality of the prep was good, using MoviPrep  The instrument was then slowly withdrawn as the colon was fully examined.   COLON FINDINGS: Three sessile polyps measuring 2-6 mm in size were found at the cecum (2) and in the ascending colon (1).  Polypectomy was performed with cold forceps (2) and using cold snare (1).  All resections were complete and all polyp tissue was completely retrieved.   Two polyps, one sessile, the other semi-pedunculated, measuring 4-6 mm in size were found in the descending colon and rectosigmoid colon.  Polypectomy was performed using cold snare. All resections were complete and all polyp tissue was completely retrieved.   There was oozing from the rectosigmoid polypectomy site and 1 hemostatic clip was placed with success.  No bleeding  or oozing after clip placement. There was moderate diverticulosis noted in the descending colon and sigmoid colon with associated tortuosity.  Retroflexion was not performed due to a narrow rectal vault. The time to cecum=16 minutes 09 seconds.  Withdrawal time=16 minutes 59 seconds.  The scope was withdrawn and the procedure completed.  COMPLICATIONS: There were no complications.    ENDOSCOPIC IMPRESSION: 1.   Three sessile polyps measuring 3-6 mm in size were found at the cecum and in the ascending colon; Polypectomy was performed with cold forceps and using cold snare 2.   Two polyps measuring 4-6 mm in size were found in the descending colon and rectosigmoid colon; Polypectomy was performed using cold snare.   1 hemostatic clip placed at polypectomy site in rectosigmoid colon with excellent hemostasis 3.   There was moderate diverticulosis noted in the descending colon and sigmoid colon with significant tortuosity  RECOMMENDATIONS: 1.  Await pathology results 2.  Hold aspirin, aspirin products, and anti-inflammatory medication for 1 week. 3.  High fiber diet 4.  Given your age, you will not need another colonoscopy for colon cancer screening or polyp surveillance.  These types of tests usually stop around the age 45.   eSigned:  Beverley Fiedler, MD 03/08/2013 10:48 AM   cc: The Patient and Judy Pimple, MD   PATIENT NAME:  Jenna Young, Jenna Young MR#: [id number]

## 2013-03-08 NOTE — Patient Instructions (Signed)

## 2013-03-08 NOTE — Progress Notes (Signed)
Pt belly was soft, non-distended, non-tender at discharge, pt denies abd pain or cramping, instructions were given to pass gas and what to do after discharge to help get the rest of the air out, pt states she does not feel like she has any gas to pass, only passed a little during recovery-adm

## 2013-03-08 NOTE — Progress Notes (Signed)
Called to room to assist during endoscopic procedure.  Patient ID and intended procedure confirmed with present staff. Received instructions for my participation in the procedure from the performing physician.  

## 2013-03-09 ENCOUNTER — Telehealth: Payer: Self-pay

## 2013-03-09 NOTE — Telephone Encounter (Signed)
  Follow up Call-  Call back number 03/08/2013  Post procedure Call Back phone  # [phone]  Permission to leave phone message Yes     Patient questions:  Do you have a fever, pain , or abdominal swelling? no Pain Score  0 *  Have you tolerated food without any problems? yes  Have you been able to return to your normal activities? yes  Do you have any questions about your discharge instructions: Diet   no Medications  no Follow up visit  no  Do you have questions or concerns about your Care? no  Actions: * If pain score is 4 or above: No action needed, pain <4.

## 2013-03-14 ENCOUNTER — Encounter: Payer: Self-pay | Admitting: Internal Medicine

## 2013-03-22 ENCOUNTER — Ambulatory Visit: Payer: Self-pay | Admitting: Oncology

## 2013-03-22 DIAGNOSIS — D509 Iron deficiency anemia, unspecified: Secondary | ICD-10-CM | POA: Diagnosis not present

## 2013-03-22 DIAGNOSIS — K219 Gastro-esophageal reflux disease without esophagitis: Secondary | ICD-10-CM | POA: Diagnosis not present

## 2013-03-22 DIAGNOSIS — I10 Essential (primary) hypertension: Secondary | ICD-10-CM | POA: Diagnosis not present

## 2013-03-22 DIAGNOSIS — Z7982 Long term (current) use of aspirin: Secondary | ICD-10-CM | POA: Diagnosis not present

## 2013-03-22 DIAGNOSIS — Z79899 Other long term (current) drug therapy: Secondary | ICD-10-CM | POA: Diagnosis not present

## 2013-03-22 LAB — CBC CANCER CENTER
Basophil #: 0 x10 3/mm (ref 0.0–0.1)
Basophil %: 0.6 %
Eosinophil #: 0.3 x10 3/mm (ref 0.0–0.7)
Eosinophil %: 3.3 %
HCT: 34.5 % — ABNORMAL LOW (ref 35.0–47.0)
HGB: 11.1 g/dL — ABNORMAL LOW (ref 12.0–16.0)
Lymphocyte #: 2.8 x10 3/mm (ref 1.0–3.6)
Lymphocyte %: 35.4 %
MCH: 26.4 pg (ref 26.0–34.0)
MCHC: 32.1 g/dL (ref 32.0–36.0)
MCV: 82 fL (ref 80–100)
Monocyte #: 0.8 x10 3/mm (ref 0.2–0.9)
Monocyte %: 10.1 %
Neutrophil #: 4 x10 3/mm (ref 1.4–6.5)
Neutrophil %: 50.6 %
Platelet: 322 x10 3/mm (ref 150–440)
RBC: 4.2 10*6/uL (ref 3.80–5.20)
RDW: 15.6 % — ABNORMAL HIGH (ref 11.5–14.5)
WBC: 8 x10 3/mm (ref 3.6–11.0)

## 2013-03-22 LAB — IRON AND TIBC
Iron Bind.Cap.(Total): 343 ug/dL (ref 250–450)
Iron Saturation: 15 %
Iron: 51 ug/dL (ref 50–170)
Unbound Iron-Bind.Cap.: 292 ug/dL

## 2013-03-22 LAB — FERRITIN: Ferritin (ARMC): 20 ng/mL (ref 8–388)

## 2013-04-02 ENCOUNTER — Ambulatory Visit (INDEPENDENT_AMBULATORY_CARE_PROVIDER_SITE_OTHER): Payer: Medicare Other | Admitting: Internal Medicine

## 2013-04-02 ENCOUNTER — Encounter: Payer: Self-pay | Admitting: Internal Medicine

## 2013-04-02 ENCOUNTER — Telehealth: Payer: Self-pay

## 2013-04-02 ENCOUNTER — Ambulatory Visit (INDEPENDENT_AMBULATORY_CARE_PROVIDER_SITE_OTHER)
Admission: RE | Admit: 2013-04-02 | Discharge: 2013-04-02 | Disposition: A | Discharge status: Home / Self Care | Payer: Medicare Other | Source: Ambulatory Visit | Attending: Internal Medicine | Admitting: Internal Medicine

## 2013-04-02 VITALS — BP 140/64 | HR 86 | Temp 98.1°F | Wt 135.0 lb

## 2013-04-02 DIAGNOSIS — M545 Low back pain, unspecified: Secondary | ICD-10-CM

## 2013-04-02 DIAGNOSIS — Y92009 Unspecified place in unspecified non-institutional (private) residence as the place of occurrence of the external cause: Secondary | ICD-10-CM

## 2013-04-02 DIAGNOSIS — W19XXXA Unspecified fall, initial encounter: Secondary | ICD-10-CM

## 2013-04-02 DIAGNOSIS — E538 Deficiency of other specified B group vitamins: Secondary | ICD-10-CM | POA: Diagnosis not present

## 2013-04-02 DIAGNOSIS — S32009A Unspecified fracture of unspecified lumbar vertebra, initial encounter for closed fracture: Secondary | ICD-10-CM | POA: Diagnosis not present

## 2013-04-02 MED ORDER — TRAMADOL HCL 50 MG PO TABS: 50.0000 mg | ORAL_TABLET | Freq: Three times a day (TID) | ORAL | Status: DC | PRN

## 2013-04-02 MED ORDER — CYANOCOBALAMIN 1000 MCG/ML IJ SOLN
1000.0000 ug | Freq: Once | INTRAMUSCULAR | Status: AC
  Administered 2013-04-02: 1000 ug via INTRAMUSCULAR

## 2013-04-02 NOTE — Addendum Note (Signed)
Addended by: Lurlean Nanny on: 04/02/2013 02:46 PM   Modules accepted: Orders

## 2013-04-02 NOTE — Progress Notes (Signed)
Pre-visit discussion using our clinic review tool. No additional management support is needed unless otherwise documented below in the visit note.  

## 2013-04-02 NOTE — Progress Notes (Signed)
Subjective:    Patient ID: Jenna Young, female    DOB: 04/20/1931, 80 y.o.   MRN: [id number]  HPI  Pt presents to the clinic today with c/o back pain s/p a fall that occurred 2 days ago. She leaned over while sitting in a chair and fell on her chest. She felt her back twist. She describes the low back pain as soreness. She has tried heat, ice and been taking Ibuprofen. She denies loss of bowel or bladder.  Review of Systems      Past Medical History  Diagnosis Date  . GERD (gastroesophageal reflux disease)   . Allergic rhinitis   . Hyperlipidemia   . Osteopenia   . Osteoporosis   . Anxiety   . Hypertension   . Anemia     Current Outpatient Prescriptions  Medication Sig Dispense Refill  . amLODipine (NORVASC) 10 MG tablet Take 1 tablet (10 mg total) by mouth daily.  30 tablet  11  . aspirin (ASPIRIN EC) 81 MG EC tablet Take 81 mg by mouth daily. Swallow whole.      . calcium-vitamin D (OSCAL WITH D) 500-200 MG-UNIT per tablet Take 2 tablets by mouth daily.        . cetirizine (ZYRTEC) 10 MG tablet Take 10 mg by mouth as needed.       . Cholecalciferol (VITAMIN D) 2000 UNITS CAPS Take 1 capsule by mouth daily.        . cyanocobalamin (,VITAMIN B-12,) 1000 MCG/ML injection Inject 1 mL (1,000 mcg total) into the muscle every 8 (eight) weeks. One injection every 2 months  1 mL  5  . DimenhyDRINATE (DRAMAMINE) 50 MG CHEW Chew 1 tablet by mouth as needed.        . Famotidine (PEPCID PO) Take 1 tablet by mouth daily as needed.        . fluticasone (FLONASE) 50 MCG/ACT nasal spray Place 2 sprays into both nostrils daily.  16 g  11  . hyoscyamine (LEVSIN SL) 0.125 MG SL tablet Place 1 tablet (0.125 mg total) under the tongue every 6 (six) hours as needed for cramping.  15 tablet  0  . losartan (COZAAR) 100 MG tablet Take 1 tablet (100 mg total) by mouth daily.  30 tablet  11  . meclizine (ANTIVERT) 25 MG tablet Take 1 tablet (25 mg total) by mouth 3 (three) times daily as needed for  dizziness.  30 tablet  1   No current facility-administered medications for this visit.    Allergies  Allergen Reactions  . Buspirone Hcl     REACTION: HEART PALPITATIONS  . Codeine     REACTION: nausea and vomiting  . Hydrochlorothiazide     REACTION: syncope-possibly from dehydration  . Lansoprazole     REACTION: abd pain  . Minocycline Hcl   . Penicillins     REACTION: mouth numbness  . Ramipril     Cough   . Sulfa Antibiotics Nausea Only    Family History  Problem Relation Age of Onset  . Hypertension Mother   . Heart disease Father   . Macular degeneration Sister   . Diabetes Maternal Aunt   . Cancer Maternal Aunt     throat    History   Social History  . Marital Status: Widowed    Spouse Name: N/A    Number of Children: 1  . Years of Education: N/A   Occupational History  .     Social History Main Topics  .  Smoking status: Former Smoker    Quit date: 03/11/1968  . Smokeless tobacco: Never Used  . Alcohol Use: No  . Drug Use: No  . Sexual Activity: Not on file   Other Topics Concern  . Not on file   Social History Narrative  . No narrative on file     Constitutional: Denies fever, malaise, fatigue, headache or abrupt weight changes.  Musculoskeletal: Pt reports decrease of range of motion and muscle pain. Denies difficulty with gait, or joint pain and swelling.  Skin: Denies redness, rashes, lesions or ulcercations.  Neurological: Denies dizziness, difficulty with memory, difficulty with speech or problems with balance and coordination.   No other specific complaints in a complete review of systems (except as listed in HPI above).  Objective:   Physical Exam  BP 140/64  Pulse 86  Temp(Src) 98.1 F (36.7 C) (Oral)  Wt 135 lb (61.236 kg)  SpO2 98% Wt Readings from Last 3 Encounters:  04/02/13 135 lb (61.236 kg)  03/08/13 137 lb (62.143 kg)  02/15/13 137 lb (62.143 kg)    General: Appears her stated age, well developed, well  nourished in NAD. Cardiovascular: Normal rate and rhythm. S1,S2 noted.  No murmur, rubs or gallops noted. No JVD or BLE edema. No carotid bruits noted. Pulmonary/Chest: Normal effort and positive vesicular breath sounds. No respiratory distress. No wheezes, rales or ronchi noted.  Musculoskeletal: Decreased extension of the back secondary to pain. No signs of joint swelling. No difficulty with gait.  Neurological: Alert and oriented. Cranial nerves II-XII intact. Coordination normal. +DTRs bilaterally.   BMET    Component Value Date/Time   NA 137 01/27/2013 0905   K 4.3 01/27/2013 0905   CL 106 01/27/2013 0905   CO2 23 01/27/2013 0905   GLUCOSE 101* 01/27/2013 0905   BUN 14 01/27/2013 0905   CREATININE 0.8 01/27/2013 0905   CALCIUM 9.6 01/27/2013 0905   GFRNONAA >60 11/15/2008 0505   GFRAA  Value: >60        The eGFR has been calculated using the MDRD equation. This calculation has not been validated in all clinical situations. eGFR's persistently <60 mL/min signify possible Chronic Kidney Disease. 11/15/2008 0505    Lipid Panel     Component Value Date/Time   CHOL 207* 01/27/2013 0905   TRIG 123.0 01/27/2013 0905   HDL 59.80 01/27/2013 0905   CHOLHDL 3 01/27/2013 0905   VLDL 24.6 01/27/2013 0905   LDLCALC 119* 01/10/2012 0858    CBC    Component Value Date/Time   WBC 6.0 01/27/2013 0905   RBC 4.01 01/27/2013 0905   HGB 11.2* 01/27/2013 0905   HCT 33.2* 01/27/2013 0905   PLT 237.0 01/27/2013 0905   MCV 82.6 01/27/2013 0905   MCHC 33.8 01/27/2013 0905   RDW 16.5* 01/27/2013 0905   LYMPHSABS 2.6 01/27/2013 0905   MONOABS 0.4 01/27/2013 0905   EOSABS 0.1 01/27/2013 0905   BASOSABS 0.0 01/27/2013 0905    Hgb A1C Lab Results  Component Value Date   HGBA1C  Value: 6.1 (NOTE) The ADA recommends the following therapeutic goal for glycemic control related to Hgb A1c measurement: Goal of therapy: <6.5 Hgb A1c  Reference: American Diabetes Association: Clinical Practice  Recommendations 2010, Diabetes Care, 2010, 33: (Suppl  1). 11/15/2008         Assessment & Plan:   Low back pain s/p fall at home:  She has some decreased ROM Will check xray of lumbar spine to make sure she does   not have a fracture Rx for Tramadol- will phone in for you Information given about fall risk and home safety  RTC as needed or if symptoms persist or worsen

## 2013-04-02 NOTE — Patient Instructions (Signed)

## 2013-04-02 NOTE — Telephone Encounter (Signed)
Called in Rx for Tramadol to pharmacy

## 2013-04-06 ENCOUNTER — Ambulatory Visit: Payer: Medicare Other

## 2013-04-07 ENCOUNTER — Telehealth: Payer: Self-pay | Admitting: Family Medicine

## 2013-04-07 NOTE — Telephone Encounter (Signed)
Let me know if she has any severe abdominal pain please  The constipation may be due to her pain med for her back -continue miralax daily- it takes several days to begin working She can try a dulcolax suppository otc also - and let me know if no improvement in the next few days

## 2013-04-07 NOTE — Telephone Encounter (Signed)
Pt called and said that she has not been able to pass stool since last Wed when she had a fall and landed on her stomach.  She has taken Miralax, a stool softener, and a third medication that she could not remember.  She is passing gas with these medications which helps the discomfort, but only temporarily.  She had OV on 04/02/13 with Webb Silversmith.

## 2013-04-08 NOTE — Telephone Encounter (Signed)
Pt notified she can try OTC magnesium citrate, pt advise it can cause cramping and if it's to bad then stop medication. Pt did let me know she has used a suppository and it did help a little. Pt notified to update [place] if no improvement, pt verbalized understanding

## 2013-04-08 NOTE — Telephone Encounter (Signed)
Pt said when she used miralax it caused severe abdominal pain so she stopped taking it and since then she hasn't had any pain but she is still very constipated she has only had 1 small BM and that was yesterday, pt wanted to know if you could recommend anything else for her to try besides the miralax, I did advise pt she can try the suppositories too, but she wants to know is there any other medications she can take orally, please advise

## 2013-04-08 NOTE — Telephone Encounter (Signed)
She can try a bottle of magnesium citrate over the counter - warn her that this can cause more cramping than the miralax - let me know how it goes

## 2013-04-11 ENCOUNTER — Ambulatory Visit: Payer: Self-pay | Admitting: Oncology

## 2013-04-12 ENCOUNTER — Telehealth: Payer: Self-pay | Admitting: Internal Medicine

## 2013-04-12 NOTE — Telephone Encounter (Signed)
Friend states pt fell almost 2 weeks again on her stomach and hasn't had a BM since. She saw the NP at Dr Marliss Coots and has been taking tramadol for pain.  When I spoke with Ms Nicole Kindred, she states the pt reported a normal stool this am. Informed her Tramadol can cause constipation, I know she doesn't like Miralax, but as long as she is on a narcotic, please take daily. She will inform the pt; pt doesn't hear well.

## 2013-04-19 ENCOUNTER — Telehealth: Payer: Self-pay

## 2013-04-19 NOTE — Telephone Encounter (Signed)
I would start some slow walking first to get some strength back - if no improvement after a week we can consult physical therapy  If improved -then can drive  Do not drive on pain med however

## 2013-04-19 NOTE — Telephone Encounter (Signed)
Pt left v/m; pt was seen on 04/02/13 after fall that fx vertabrae; pt said her legs are still weak; pt said is feeling better, with less pain now. Pt wants to if she should be walking or if pt can drive. Pt request cb.

## 2013-04-20 NOTE — Telephone Encounter (Signed)
Left voicemail requesting pt to call office back 

## 2013-04-20 NOTE — Telephone Encounter (Signed)
Pt notified of Dr. Marliss Coots comments  "I would start some slow walking first to get some strength back - if no improvement after a week we can consult physical therapy  If improved -then can drive  Do not drive on pain med however"  And verbalized understanding. Pt did let me know she isn't taking her pain meds anymore, they were to strong for her stomach so she is only taking OTC pain med

## 2013-06-01 ENCOUNTER — Ambulatory Visit (INDEPENDENT_AMBULATORY_CARE_PROVIDER_SITE_OTHER): Payer: Medicare Other

## 2013-06-01 DIAGNOSIS — E538 Deficiency of other specified B group vitamins: Secondary | ICD-10-CM

## 2013-06-01 MED ORDER — CYANOCOBALAMIN 1000 MCG/ML IJ SOLN
1000.0000 ug | Freq: Once | INTRAMUSCULAR | Status: AC
  Administered 2013-06-01: 1000 ug via INTRAMUSCULAR

## 2013-06-18 DIAGNOSIS — L820 Inflamed seborrheic keratosis: Secondary | ICD-10-CM | POA: Diagnosis not present

## 2013-06-18 DIAGNOSIS — L739 Follicular disorder, unspecified: Secondary | ICD-10-CM | POA: Diagnosis not present

## 2013-06-18 DIAGNOSIS — L570 Actinic keratosis: Secondary | ICD-10-CM | POA: Diagnosis not present

## 2013-07-26 ENCOUNTER — Ambulatory Visit: Payer: Self-pay | Admitting: Oncology

## 2013-07-26 DIAGNOSIS — I10 Essential (primary) hypertension: Secondary | ICD-10-CM | POA: Diagnosis not present

## 2013-07-26 DIAGNOSIS — Z79899 Other long term (current) drug therapy: Secondary | ICD-10-CM | POA: Diagnosis not present

## 2013-07-26 DIAGNOSIS — D509 Iron deficiency anemia, unspecified: Secondary | ICD-10-CM | POA: Diagnosis not present

## 2013-07-26 DIAGNOSIS — Z9089 Acquired absence of other organs: Secondary | ICD-10-CM | POA: Diagnosis not present

## 2013-07-26 DIAGNOSIS — R42 Dizziness and giddiness: Secondary | ICD-10-CM | POA: Diagnosis not present

## 2013-07-26 DIAGNOSIS — Z7982 Long term (current) use of aspirin: Secondary | ICD-10-CM | POA: Diagnosis not present

## 2013-07-26 DIAGNOSIS — K219 Gastro-esophageal reflux disease without esophagitis: Secondary | ICD-10-CM | POA: Diagnosis not present

## 2013-07-27 DIAGNOSIS — D509 Iron deficiency anemia, unspecified: Secondary | ICD-10-CM | POA: Diagnosis not present

## 2013-07-27 LAB — CBC CANCER CENTER
Basophil #: 0.1 x10 3/mm (ref 0.0–0.1)
Basophil %: 0.7 %
Eosinophil #: 0.1 x10 3/mm (ref 0.0–0.7)
Eosinophil %: 1.3 %
HCT: 33.7 % — ABNORMAL LOW (ref 35.0–47.0)
HGB: 11.3 g/dL — ABNORMAL LOW (ref 12.0–16.0)
Lymphocyte #: 3.1 x10 3/mm (ref 1.0–3.6)
Lymphocyte %: 42.4 %
MCH: 26.7 pg (ref 26.0–34.0)
MCHC: 33.4 g/dL (ref 32.0–36.0)
MCV: 80 fL (ref 80–100)
Monocyte #: 0.5 x10 3/mm (ref 0.2–0.9)
Monocyte %: 7.1 %
Neutrophil #: 3.5 x10 3/mm (ref 1.4–6.5)
Neutrophil %: 48.5 %
Platelet: 257 x10 3/mm (ref 150–440)
RBC: 4.22 10*6/uL (ref 3.80–5.20)
RDW: 17.9 % — ABNORMAL HIGH (ref 11.5–14.5)
WBC: 7.2 x10 3/mm (ref 3.6–11.0)

## 2013-07-27 LAB — IRON AND TIBC
Iron Bind.Cap.(Total): 391 ug/dL (ref 250–450)
Iron Saturation: 18 %
Iron: 69 ug/dL (ref 50–170)
Unbound Iron-Bind.Cap.: 322 ug/dL

## 2013-07-27 LAB — FERRITIN: Ferritin (ARMC): 18 ng/mL (ref 8–388)

## 2013-08-03 ENCOUNTER — Ambulatory Visit (INDEPENDENT_AMBULATORY_CARE_PROVIDER_SITE_OTHER): Payer: Medicare Other

## 2013-08-03 DIAGNOSIS — E538 Deficiency of other specified B group vitamins: Secondary | ICD-10-CM

## 2013-08-03 MED ORDER — CYANOCOBALAMIN 1000 MCG/ML IJ SOLN
1000.0000 ug | Freq: Once | INTRAMUSCULAR | Status: AC
  Administered 2013-08-03: 1000 ug via INTRAMUSCULAR

## 2013-08-09 ENCOUNTER — Ambulatory Visit: Payer: Self-pay | Admitting: Oncology

## 2013-09-20 ENCOUNTER — Ambulatory Visit (INDEPENDENT_AMBULATORY_CARE_PROVIDER_SITE_OTHER): Payer: Medicare Other | Admitting: Family Medicine

## 2013-09-20 ENCOUNTER — Encounter: Payer: Self-pay | Admitting: Family Medicine

## 2013-09-20 VITALS — BP 132/64 | HR 77 | Temp 98.3°F | Ht 62.75 in | Wt 137.5 lb

## 2013-09-20 DIAGNOSIS — R8290 Unspecified abnormal findings in urine: Secondary | ICD-10-CM | POA: Insufficient documentation

## 2013-09-20 DIAGNOSIS — E538 Deficiency of other specified B group vitamins: Secondary | ICD-10-CM | POA: Diagnosis not present

## 2013-09-20 DIAGNOSIS — R5383 Other fatigue: Secondary | ICD-10-CM

## 2013-09-20 DIAGNOSIS — F341 Dysthymic disorder: Secondary | ICD-10-CM | POA: Insufficient documentation

## 2013-09-20 DIAGNOSIS — S20229A Contusion of unspecified back wall of thorax, initial encounter: Secondary | ICD-10-CM | POA: Diagnosis not present

## 2013-09-20 DIAGNOSIS — S20221A Contusion of right back wall of thorax, initial encounter: Secondary | ICD-10-CM

## 2013-09-20 DIAGNOSIS — R82998 Other abnormal findings in urine: Secondary | ICD-10-CM | POA: Diagnosis not present

## 2013-09-20 DIAGNOSIS — R5381 Other malaise: Secondary | ICD-10-CM

## 2013-09-20 LAB — POCT URINALYSIS DIPSTICK
Bilirubin, UA: NEGATIVE
Glucose, UA: NEGATIVE
Ketones, UA: NEGATIVE
Nitrite, UA: NEGATIVE
Protein, UA: NEGATIVE
Spec Grav, UA: 1.01
Urobilinogen, UA: 0.2
pH, UA: 6

## 2013-09-20 MED ORDER — CYANOCOBALAMIN 1000 MCG/ML IJ SOLN
1000.0000 ug | Freq: Once | INTRAMUSCULAR | Status: AC
  Administered 2013-09-20: 1000 ug via INTRAMUSCULAR

## 2013-09-20 NOTE — Assessment & Plan Note (Signed)
Pt admits to being lonely and needing more socialization - she would like to someday move from her house to an apt in a retirement communitly to be around more people-she will discuss this with her daughter

## 2013-09-20 NOTE — Assessment & Plan Note (Signed)
milldy abn/ plus one leukocytes in pt with fatigue but no urinary symptoms  Will culture and enc to drink water

## 2013-09-20 NOTE — Patient Instructions (Signed)
I think your back and side are healing  Try not to fall - especially if you feel unsteady  Talk to your daughter about possibly moving to [place] to a [place] community for more socialization  This would help your mood  Take bonine if needed for dizziness and keep me updated about that  We will do a urine culture to make sure you do not have an infection- will let you know

## 2013-09-20 NOTE — Assessment & Plan Note (Signed)
Mild- some old ecchymosis on back and R CW-not very tender Non tender spine with nl rom Reassuring - susupect resolving hematoma  wll continue to watch

## 2013-09-20 NOTE — Progress Notes (Signed)
Pre visit review using our clinic review tool, if applicable. No additional management support is needed unless otherwise documented below in the visit note. 

## 2013-09-20 NOTE — Assessment & Plan Note (Signed)
Shot today 

## 2013-09-20 NOTE — Progress Notes (Signed)
Subjective:    Patient ID: Jenna Young, female    DOB: 1931/11/21, 80 y.o.   MRN: [id number]  HPI Here for back pain after a fall 2-3 weeks ago  Also tired   She was trying to turn on a light (stepping on a bed rail)- hand slipped -she fell backwards and her back hit a dresser and she fell on the floor Bruised but not much pain at the time  Is more sore now than it was  R side - mid back  No radiation of pain   Feeling tired and washed out lately  Afraid her vertigo will come back (she gets this occ) Had a brief episode last week - took a bonine and she got better   No new urinary symptoms  Some nocturia-not new No dysuria or hematuria   Mood stays down - she is generally down Lonely  No SI  She stays quite motivated  Goes for exercise and socializes with her girlfriends as much as she can - but would prefer to move into a retirement community so she has more to do and more friends Has a daughter in [place] who could help her with this   Results for orders placed in visit on 09/20/13  POCT URINALYSIS DIPSTICK      Result Value Ref Range   Color, UA yellow     Clarity, UA hazy     Glucose, UA neg.     Bilirubin, UA neg.     Ketones, UA neg.     Spec Grav, UA 1.010     Blood, UA trace     pH, UA 6.0     Protein, UA neg.     Urobilinogen, UA 0.2     Nitrite, UA neg.     Leukocytes, UA small (1+)       Patient Active Problem List   Diagnosis Date Noted  . Abnormal urinalysis 09/20/2013  . Back contusion 09/20/2013  . Dysthymia 09/20/2013  . Encounter for Medicare annual wellness exam 02/03/2013  . Vaso-vagal reaction 09/25/2012  . BPPV (benign paroxysmal positional vertigo) 06/15/2012  . Chronic right ear pain 03/16/2012  . Colon cancer screening 01/17/2012  . Anemia 01/17/2012  . Vertigo 11/25/2011  . GRIEF REACTION 08/01/2009  . UNSPECIFIED VITAMIN D DEFICIENCY 08/19/2008  . ANXIETY 01/29/2008  . HYPERTENSION NEC 09/25/2007  . VITAMIN B12 DEFICIENCY  09/24/2006  . HYPERLIPIDEMIA 09/24/2006  . VENOUS INSUFFICIENCY 09/24/2006  . ALLERGIC RHINITIS 09/24/2006  . GERD 09/24/2006  . OSTEOPOROSIS 09/24/2006   Past Medical History  Diagnosis Date  . GERD (gastroesophageal reflux disease)   . Allergic rhinitis   . Hyperlipidemia   . Osteopenia   . Osteoporosis   . Anxiety   . Hypertension   . Anemia    Past Surgical History  Procedure Laterality Date  . Appendectomy    . Cesarean section    . Cholecystectomy    . Ovarian cyst surgery    . Fracture surgery  07/2008    neck  . Breast surgery  10/10    benign biopsy  negative   History  Substance Use Topics  . Smoking status: Former Smoker    Quit date: 03/11/1968  . Smokeless tobacco: Never Used  . Alcohol Use: No   Family History  Problem Relation Age of Onset  . Hypertension Mother   . Heart disease Father   . Macular degeneration Sister   . Diabetes Maternal Aunt   . Cancer  Maternal Aunt     throat   Allergies  Allergen Reactions  . Buspirone Hcl     REACTION: HEART PALPITATIONS  . Codeine     REACTION: nausea and vomiting  . Hydrochlorothiazide     REACTION: syncope-possibly from dehydration  . Lansoprazole     REACTION: abd pain  . Minocycline Hcl   . Penicillins     REACTION: mouth numbness  . Ramipril     Cough   . Sulfa Antibiotics Nausea Only   Current Outpatient Prescriptions on File Prior to Visit  Medication Sig Dispense Refill  . amLODipine (NORVASC) 10 MG tablet Take 1 tablet (10 mg total) by mouth daily.  30 tablet  11  . aspirin (ASPIRIN EC) 81 MG EC tablet Take 81 mg by mouth daily. Swallow whole.      . calcium-vitamin D (OSCAL WITH D) 500-200 MG-UNIT per tablet Take 2 tablets by mouth daily.        . cetirizine (ZYRTEC) 10 MG tablet Take 10 mg by mouth as needed.       . Cholecalciferol (VITAMIN D) 2000 UNITS CAPS Take 1 capsule by mouth daily.        . cyanocobalamin (,VITAMIN B-12,) 1000 MCG/ML injection Inject 1 mL (1,000 mcg total)  into the muscle every 8 (eight) weeks. One injection every 2 months  1 mL  5  . DimenhyDRINATE (DRAMAMINE) 50 MG CHEW Chew 1 tablet by mouth as needed.        . Famotidine (PEPCID PO) Take 1 tablet by mouth daily as needed.        . fluticasone (FLONASE) 50 MCG/ACT nasal spray Place 2 sprays into both nostrils daily.  16 g  11  . hyoscyamine (LEVSIN SL) 0.125 MG SL tablet Place 1 tablet (0.125 mg total) under the tongue every 6 (six) hours as needed for cramping.  15 tablet  0  . losartan (COZAAR) 100 MG tablet Take 1 tablet (100 mg total) by mouth daily.  30 tablet  11  . meclizine (ANTIVERT) 25 MG tablet Take 1 tablet (25 mg total) by mouth 3 (three) times daily as needed for dizziness.  30 tablet  1  . traMADol (ULTRAM) 50 MG tablet Take 1 tablet (50 mg total) by mouth every 8 (eight) hours as needed.  30 tablet  0   No current facility-administered medications on file prior to visit.    Review of Systems    Review of Systems  Constitutional: Negative for fever, appetite change,  and unexpected weight change. pos for fatigue  Eyes: Negative for pain and visual disturbance.  Respiratory: Negative for cough and shortness of breath.   Cardiovascular: Negative for cp or palpitations    Gastrointestinal: Negative for nausea, diarrhea and constipation.  Genitourinary: Negative for urgency and pos for frequency, neg for hematuria or dysuria or flank pain  Skin: Negative for pallor or rash   Neurological: Negative for weakness, numbness and headaches. pos for intermittent vertigo  Hematological: Negative for adenopathy. Does not bruise/bleed easily.  Psychiatric/Behavioral:pos for dysphoric mood without SI and neg for anxiety     Objective:   Physical Exam  Constitutional: She appears well-developed and well-nourished. No distress.  HENT:  Head: Normocephalic and atraumatic.  Mouth/Throat: Oropharynx is clear and moist.  Eyes: Conjunctivae and EOM are normal. Pupils are equal, round, and  reactive to light. No scleral icterus.  No nystagmus today  Neck: Normal range of motion. Neck supple. No JVD present. Carotid bruit  is not present. No thyromegaly present.  Cardiovascular: Normal rate, regular rhythm and intact distal pulses.  Exam reveals no gallop.   Pulmonary/Chest: Effort normal and breath sounds normal. No respiratory distress. She has no wheezes. She has no rales. She exhibits tenderness.  No crackles  Mild tenderness over R CW with ecchymosis (resolving) and no crepitus  Abdominal: Soft. Bowel sounds are normal. She exhibits no distension and no mass. There is no tenderness.  Musculoskeletal: She exhibits tenderness. She exhibits no edema.  R thoracic area - 3-4 cm oval area of ecchymosis that is slt tender  Nl rom spine No spinal tenderness No other signs of trauma   Lymphadenopathy:    She has no cervical adenopathy.  Neurological: She is alert. She has normal reflexes. No cranial nerve deficit. She exhibits normal muscle tone. Coordination normal.  Skin: Skin is warm and dry. No rash noted. No erythema. No pallor.  Psychiatric: Her behavior is normal. Thought content normal. Her affect is not blunt, not labile and not inappropriate. Her speech is not delayed and not tangential. Thought content is not paranoid. Cognition and memory are normal. She exhibits a depressed mood. She expresses no homicidal and no suicidal ideation.  Pt seems a bit down-but not tearful Is pleasant and talkative           Assessment & Plan:   Problem List Items Addressed This Visit     Other   VITAMIN B12 DEFICIENCY     Shot today    Abnormal urinalysis     milldy abn/ plus one leukocytes in pt with fatigue but no urinary symptoms  Will culture and enc to drink water    Relevant Orders      Urine culture   Back contusion     Mild- some old ecchymosis on back and R CW-not very tender Non tender spine with nl rom Reassuring - susupect resolving hematoma  wll continue to  watch    Dysthymia     Pt admits to being lonely and needing more socialization - she would like to someday move from her house to an apt in a retirement communitly to be around more people-she will discuss this with her daughter      Other Visit Diagnoses   Other fatigue    -  Primary    Relevant Orders       POCT urinalysis dipstick (Completed)    Vitamin B 12 deficiency        Relevant Medications       cyanocobalamin ((VITAMIN B-12)) injection 1,000 mcg (Completed)

## 2013-09-22 LAB — URINE CULTURE: Colony Count: 8000

## 2013-10-05 ENCOUNTER — Ambulatory Visit: Payer: Medicare Other

## 2013-10-06 ENCOUNTER — Telehealth: Payer: Self-pay | Admitting: Family Medicine

## 2013-10-06 DIAGNOSIS — H8113 Benign paroxysmal vertigo, bilateral: Secondary | ICD-10-CM

## 2013-10-06 NOTE — Telephone Encounter (Signed)
Ref done- let her know she will get a call

## 2013-10-06 NOTE — Telephone Encounter (Signed)
Jenna Young calls in stating her vertigo is worse and would like to be referred back to the [facility] in [place]. Current vertigo is not getting any better, pt is afraid to drive. Please call pt with referral information and any questions. Thank you

## 2013-10-06 NOTE — Telephone Encounter (Signed)
Left voicemail letting pt know referral done and Marion/Linda will call to schedule appt

## 2013-10-18 ENCOUNTER — Encounter: Payer: Self-pay | Admitting: Family Medicine

## 2013-10-18 ENCOUNTER — Ambulatory Visit (INDEPENDENT_AMBULATORY_CARE_PROVIDER_SITE_OTHER): Payer: Medicare Other | Admitting: Family Medicine

## 2013-10-18 VITALS — BP 134/60 | HR 82 | Temp 98.1°F | Ht 62.75 in | Wt 138.2 lb

## 2013-10-18 DIAGNOSIS — K1120 Sialoadenitis, unspecified: Secondary | ICD-10-CM | POA: Diagnosis not present

## 2013-10-18 MED ORDER — CLINDAMYCIN HCL 150 MG PO CAPS: 450.0000 mg | ORAL_CAPSULE | Freq: Three times a day (TID) | ORAL | Status: DC

## 2013-10-18 MED ORDER — CIPROFLOXACIN HCL 500 MG PO TABS: 500.0000 mg | ORAL_TABLET | Freq: Two times a day (BID) | ORAL | Status: DC

## 2013-10-18 NOTE — Progress Notes (Signed)
[place] [address]           Phone: [phone]             Fax: [phone]  Patient ID: Jenna Young MRN: [id number], DOB: [birth year], 80 y.o. Date of Encounter: 10/18/2013  Primary Physician:  Loura Pardon, MD   Chief Complaint: Knot on side of neck   Subjective:   History of Present Illness:  Jenna Young is a 80 y.o. very pleasant female patient who presents with the following:  Pleasant patient who developed some pain on the right side of her neck with some swelling at the right jaw line. She has not had any fevers, chills, or sweats she knows of. It is tender to palpation. She has not had any ear pain. No sore throat, no recent sinus infections or other URIs or other systemic symptoms.  Past Medical History, Surgical History, Social History, Family History, Problem List, Medications, and Allergies have been reviewed and updated if relevant.  Review of Systems: ROS: GEN: Acute illness details above GI: Tolerating PO intake GU: maintaining adequate hydration and urination Pulm: No SOB Interactive and getting along well at home.  Otherwise, ROS is as per the HPI.   Objective:   Physical Examination: BP 134/60  Pulse 82  Temp(Src) 98.1 F (36.7 C) (Oral)  Ht 5' 2.75" (1.594 m)  Wt 138 lb 4 oz (62.71 kg)  BMI 24.68 kg/m2   GEN: WDWN, NAD, Non-toxic, Alert & Oriented x 3 HEENT: Atraumatic, Normocephalic. TM clear B. NT.  NECK: On the left side there is no cervical lymphadenopathy. On the right side at the corner of the jaw there is a 3 cm area that is notably tender to palpation at the edge of the jaw in the region of the parotid gland. There appears to be no surrounding adjacent lymphadenopathy. Ears and Nose: No external deformity. EXTR: No clubbing/cyanosis/edema NEURO: Normal gait.  PSYCH: Normally interactive. Conversant. Not depressed or anxious appearing.  Calm demeanor.     Laboratory and Imaging Data:  Assessment &  Plan:   Parotitis  Most likely infectious or a blocked duct. If it does not get better, then further investigation would be warranted and recommended followup in the case.  New Prescriptions   CIPROFLOXACIN (CIPRO) 500 MG TABLET    Take 1 tablet (500 mg total) by mouth 2 (two) times daily.   CLINDAMYCIN (CLEOCIN) 150 MG CAPSULE    Take 3 capsules (450 mg total) by mouth 3 (three) times daily.   Patient Instructions  Usually would get better in 2-3 weeks, if not better by 11/09/2013, f/u Dr. Glori Bickers     Signed,  Maud Deed. Stephanie Mcglone, MD, [facility]  Current Medications at Discharge:   Medication List       This list is accurate as of: 10/18/13  2:57 PM.  Always use your most recent med list.               amLODipine 10 MG tablet  Commonly known as:  NORVASC  Take 1 tablet (10 mg total) by mouth daily.     aspirin EC 81 MG EC tablet  Generic drug:  aspirin  Take 81 mg by mouth daily. Swallow whole.     calcium-vitamin D 500-200 MG-UNIT per tablet  Commonly known as:  OSCAL WITH D  Take 2 tablets by mouth daily.  cetirizine 10 MG tablet  Commonly known as:  ZYRTEC  Take 10 mg by mouth as needed.     ciprofloxacin 500 MG tablet  Commonly known as:  CIPRO  Take 1 tablet (500 mg total) by mouth 2 (two) times daily.     clindamycin 150 MG capsule  Commonly known as:  CLEOCIN  Take 3 capsules (450 mg total) by mouth 3 (three) times daily.     cyanocobalamin 1000 MCG/ML injection  Commonly known as:  (VITAMIN B-12)  Inject 1 mL (1,000 mcg total) into the muscle every 8 (eight) weeks. One injection every 2 months     DRAMAMINE 50 MG Chew  Generic drug:  DimenhyDRINATE  Chew 1 tablet by mouth as needed.     fluticasone 50 MCG/ACT nasal spray  Commonly known as:  FLONASE  Place 2 sprays into both nostrils daily.     hyoscyamine 0.125 MG SL tablet  Commonly known as:  LEVSIN SL  Place 1 tablet (0.125 mg total) under the tongue every 6 (six) hours as needed  for cramping.     losartan 100 MG tablet  Commonly known as:  COZAAR  Take 1 tablet (100 mg total) by mouth daily.     meclizine 25 MG tablet  Commonly known as:  ANTIVERT  Take 1 tablet (25 mg total) by mouth 3 (three) times daily as needed for dizziness.     PEPCID PO  Take 1 tablet by mouth daily as needed.     traMADol 50 MG tablet  Commonly known as:  ULTRAM  Take 1 tablet (50 mg total) by mouth every 8 (eight) hours as needed.     Vitamin D 2000 UNITS Caps  Take 1 capsule by mouth daily.

## 2013-10-18 NOTE — Patient Instructions (Signed)
Usually would get better in 2-3 weeks, if not better by 11/09/2013, f/u Dr. Glori Bickers

## 2013-10-18 NOTE — Progress Notes (Signed)
Pre visit review using our clinic review tool, if applicable. No additional management support is needed unless otherwise documented below in the visit note. 

## 2013-10-26 ENCOUNTER — Encounter: Payer: Self-pay | Admitting: Family Medicine

## 2013-10-26 DIAGNOSIS — IMO0001 Reserved for inherently not codable concepts without codable children: Secondary | ICD-10-CM | POA: Diagnosis not present

## 2013-10-26 DIAGNOSIS — R42 Dizziness and giddiness: Secondary | ICD-10-CM | POA: Diagnosis not present

## 2013-10-26 DIAGNOSIS — H8110 Benign paroxysmal vertigo, unspecified ear: Secondary | ICD-10-CM | POA: Diagnosis not present

## 2013-10-28 ENCOUNTER — Telehealth: Payer: Self-pay

## 2013-10-28 MED ORDER — PRAMOXINE HCL 1 % RE FOAM: 1.0000 "application " | Freq: Three times a day (TID) | RECTAL | Status: DC | PRN

## 2013-10-28 NOTE — Telephone Encounter (Signed)
Please get more information.

## 2013-10-28 NOTE — Telephone Encounter (Signed)
Jenna Young notified prescription has been sent to her pharmacy.

## 2013-10-28 NOTE — Telephone Encounter (Signed)
Pt left v/m; pt was seen on 10/18/13 and has taken clindamycin and cipro; pt has had diarrhea and now having rectal itching. Pt request med for rectal itching to [facility] [address]. Pt request cb.

## 2013-10-29 ENCOUNTER — Telehealth: Payer: Self-pay | Admitting: *Deleted

## 2013-10-29 NOTE — Telephone Encounter (Signed)
Received Prior Authorization request from [facility] for [employer] and they state this is an excluded medication due to it being an OTC medication.  Spoke with pharmacist at [facility] who states they do not carry the Proctofoam as an over the counter medication and it was going to cost the patient $90.  Advised by pharmacist her cheapest option would be to try Preparation H anti-itch cream.  Jenna Young notified.

## 2013-11-01 DIAGNOSIS — R42 Dizziness and giddiness: Secondary | ICD-10-CM | POA: Diagnosis not present

## 2013-11-01 DIAGNOSIS — H8110 Benign paroxysmal vertigo, unspecified ear: Secondary | ICD-10-CM | POA: Diagnosis not present

## 2013-11-01 DIAGNOSIS — IMO0001 Reserved for inherently not codable concepts without codable children: Secondary | ICD-10-CM | POA: Diagnosis not present

## 2013-11-08 DIAGNOSIS — H8110 Benign paroxysmal vertigo, unspecified ear: Secondary | ICD-10-CM | POA: Diagnosis not present

## 2013-11-08 DIAGNOSIS — IMO0001 Reserved for inherently not codable concepts without codable children: Secondary | ICD-10-CM | POA: Diagnosis not present

## 2013-11-08 DIAGNOSIS — R42 Dizziness and giddiness: Secondary | ICD-10-CM | POA: Diagnosis not present

## 2013-11-09 ENCOUNTER — Encounter: Payer: Self-pay | Admitting: Family Medicine

## 2013-11-09 DIAGNOSIS — H8110 Benign paroxysmal vertigo, unspecified ear: Secondary | ICD-10-CM | POA: Diagnosis not present

## 2013-11-09 DIAGNOSIS — IMO0001 Reserved for inherently not codable concepts without codable children: Secondary | ICD-10-CM | POA: Diagnosis not present

## 2013-11-09 DIAGNOSIS — R42 Dizziness and giddiness: Secondary | ICD-10-CM | POA: Diagnosis not present

## 2013-11-23 ENCOUNTER — Ambulatory Visit (INDEPENDENT_AMBULATORY_CARE_PROVIDER_SITE_OTHER): Payer: Medicare Other

## 2013-11-23 DIAGNOSIS — E538 Deficiency of other specified B group vitamins: Secondary | ICD-10-CM

## 2013-11-23 MED ORDER — CYANOCOBALAMIN 1000 MCG/ML IJ SOLN
1000.0000 ug | Freq: Once | INTRAMUSCULAR | Status: AC
  Administered 2013-11-23: 1000 ug via INTRAMUSCULAR

## 2013-12-09 ENCOUNTER — Encounter: Payer: Self-pay | Admitting: Family Medicine

## 2013-12-14 ENCOUNTER — Telehealth: Payer: Self-pay | Admitting: Family Medicine

## 2013-12-14 NOTE — Telephone Encounter (Signed)
I will see her then  

## 2013-12-14 NOTE — Telephone Encounter (Signed)
Patient Information:  Caller Name: Pallie  Phone: [phone]  Patient: Jenna Young, Jenna Young  Gender: Female  DOB: 30-Aug-1931  Age: 80 Years  PCP: Loura Pardon [facility])  Office Follow Up:  Does the office need to follow up with this patient?: No  Instructions For The Office: N/A  RN Note:  Patient calling regarding one episode of mild dizziness on 12/12/13.  States "I took my blood pressure and it was 176/over something." Blood pressure since has been 158/58 to 129/73.  Denies any other episodes of dizziness or missing any pills.  Patient has scheduled an appointment for 12/15/13 for evaluation.  Symptoms  Reason For Call & Symptoms: dizziness  Reviewed Health History In EMR: Yes  Reviewed Medications In EMR: Yes  Reviewed Allergies In EMR: Yes  Reviewed Surgeries / Procedures: Yes  Date of Onset of Symptoms: 12/12/2013  Guideline(s) Used:  High Blood Pressure  Disposition Per Guideline:   See Today in Office  Reason For Disposition Reached:   Patient wants to be seen  Advice Given:  Lifestyle Changes  Maintain a healthy weight. Lose weight if you are overweight.  Do 30 minutes of aerobic physical activity (e.g., brisk walking) most days of the week.  If you smoke, you should stop.  If you drink alcohol, you should limit your daily alcohol drinking. Women should have no more than one drink per day. Men should have no more than 2 drinks per day. A drink is defined as 1.5 oz hard liquor (one shot or jigger; 45 ml), 5 oz wine (small glass; 150 ml), or 12 oz beer (one can; 360 ml).  Call Back If:  Headache, blurred vision, difficulty talking, or difficulty walking occurs  Chest pain or difficulty breathing occurs  You want to go in to the office for a blood pressure check  You become worse.  RN Overrode Recommendation:  Follow Up With Office Later  Patient declines appointment today because she is seeing ENT for visit.  Appointment is scheduled for 12/15/13 with office  regarding HTN

## 2013-12-15 ENCOUNTER — Ambulatory Visit (INDEPENDENT_AMBULATORY_CARE_PROVIDER_SITE_OTHER): Payer: Medicare Other | Admitting: Family Medicine

## 2013-12-15 ENCOUNTER — Ambulatory Visit: Payer: Medicare Other

## 2013-12-15 ENCOUNTER — Encounter: Payer: Self-pay | Admitting: Family Medicine

## 2013-12-15 VITALS — BP 136/54 | HR 78 | Temp 98.1°F | Ht 62.75 in | Wt 136.5 lb

## 2013-12-15 DIAGNOSIS — Z23 Encounter for immunization: Secondary | ICD-10-CM

## 2013-12-15 DIAGNOSIS — H8113 Benign paroxysmal vertigo, bilateral: Secondary | ICD-10-CM

## 2013-12-15 DIAGNOSIS — I10 Essential (primary) hypertension: Secondary | ICD-10-CM | POA: Diagnosis not present

## 2013-12-15 MED ORDER — MECLIZINE HCL 25 MG PO TABS: 25.0000 mg | ORAL_TABLET | Freq: Three times a day (TID) | ORAL | Status: DC | PRN

## 2013-12-15 NOTE — Assessment & Plan Note (Signed)
This is stable here  occ spikes at home - suspect this is in resp to anxiety or her occ dizziness  inst to take meclizine if needed/lie down and update if bp does not return to baseline  Reassuring exam today

## 2013-12-15 NOTE — Patient Instructions (Signed)
Your occasional dizziness may be the cause of increased blood pressure at times  If you are anxious or worried-this can also raise blood pressure Continue current medicines  If you get dizzy- lie down and take a meclizine (I will refill this) - watch out for sedation with this  If symptoms worsen or become more frequent let me know Blood pressure is good and stable today

## 2013-12-15 NOTE — Assessment & Plan Note (Signed)
This is intermittent-not frequent and helped with mecilzine Refilled this  Disc fall prev  Will take meclizine prn and lie down- then update if not imp  Reassuring exam today
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
## 2013-12-15 NOTE — Progress Notes (Signed)
Pre visit review using our clinic review tool, if applicable. No additional management support is needed unless otherwise documented below in the visit note. 

## 2013-12-16 ENCOUNTER — Telehealth: Payer: Self-pay | Admitting: Family Medicine

## 2013-12-16 ENCOUNTER — Other Ambulatory Visit: Payer: Self-pay

## 2013-12-16 DIAGNOSIS — Z1231 Encounter for screening mammogram for malignant neoplasm of breast: Secondary | ICD-10-CM

## 2013-12-16 NOTE — Telephone Encounter (Signed)
emmi emailed °

## 2013-12-28 ENCOUNTER — Ambulatory Visit
Admission: RE | Admit: 2013-12-28 | Discharge: 2013-12-28 | Disposition: A | Discharge status: Home / Self Care | Payer: Medicare Other | Source: Ambulatory Visit

## 2013-12-28 DIAGNOSIS — Z1231 Encounter for screening mammogram for malignant neoplasm of breast: Secondary | ICD-10-CM | POA: Diagnosis not present

## 2014-01-19 ENCOUNTER — Other Ambulatory Visit: Payer: Self-pay | Admitting: Family Medicine

## 2014-01-25 ENCOUNTER — Ambulatory Visit (INDEPENDENT_AMBULATORY_CARE_PROVIDER_SITE_OTHER): Payer: Medicare Other

## 2014-01-25 DIAGNOSIS — E538 Deficiency of other specified B group vitamins: Secondary | ICD-10-CM

## 2014-01-25 MED ORDER — CYANOCOBALAMIN 1000 MCG/ML IJ SOLN
1000.0000 ug | Freq: Once | INTRAMUSCULAR | Status: AC
  Administered 2014-01-25: 1000 ug via INTRAMUSCULAR

## 2014-01-31 ENCOUNTER — Other Ambulatory Visit: Payer: Self-pay | Admitting: Family Medicine

## 2014-02-20 ENCOUNTER — Telehealth: Payer: Self-pay | Admitting: Family Medicine

## 2014-02-20 DIAGNOSIS — M810 Age-related osteoporosis without current pathological fracture: Secondary | ICD-10-CM

## 2014-02-20 DIAGNOSIS — E559 Vitamin D deficiency, unspecified: Secondary | ICD-10-CM

## 2014-02-20 DIAGNOSIS — E538 Deficiency of other specified B group vitamins: Secondary | ICD-10-CM

## 2014-02-20 DIAGNOSIS — I10 Essential (primary) hypertension: Secondary | ICD-10-CM

## 2014-02-20 NOTE — Telephone Encounter (Signed)
-----   Message from Ellamae Sia sent at 02/16/2014  4:56 PM EST ----- Regarding: Lab orders for Monday,12.14.15 Patient is scheduled for CPX labs, please order future labs, Thanks , Karna Christmas

## 2014-02-21 ENCOUNTER — Other Ambulatory Visit (INDEPENDENT_AMBULATORY_CARE_PROVIDER_SITE_OTHER): Payer: Medicare Other

## 2014-02-21 DIAGNOSIS — E538 Deficiency of other specified B group vitamins: Secondary | ICD-10-CM | POA: Diagnosis not present

## 2014-02-21 DIAGNOSIS — I10 Essential (primary) hypertension: Secondary | ICD-10-CM

## 2014-02-21 DIAGNOSIS — E559 Vitamin D deficiency, unspecified: Secondary | ICD-10-CM | POA: Diagnosis not present

## 2014-02-21 DIAGNOSIS — M810 Age-related osteoporosis without current pathological fracture: Secondary | ICD-10-CM

## 2014-02-21 LAB — VITAMIN B12: Vitamin B-12: 397 pg/mL (ref 211–911)

## 2014-02-21 LAB — CBC WITH DIFFERENTIAL/PLATELET
Basophils Absolute: 0 10*3/uL (ref 0.0–0.1)
Basophils Relative: 0.5 % (ref 0.0–3.0)
Eosinophils Absolute: 0.2 10*3/uL (ref 0.0–0.7)
Eosinophils Relative: 2.9 % (ref 0.0–5.0)
HCT: 34.1 % — ABNORMAL LOW (ref 36.0–46.0)
Hemoglobin: 11.2 g/dL — ABNORMAL LOW (ref 12.0–15.0)
Lymphocytes Relative: 44 % (ref 12.0–46.0)
Lymphs Abs: 2.5 10*3/uL (ref 0.7–4.0)
MCHC: 32.9 g/dL (ref 30.0–36.0)
MCV: 81.5 fl (ref 78.0–100.0)
Monocytes Absolute: 0.5 10*3/uL (ref 0.1–1.0)
Monocytes Relative: 7.8 % (ref 3.0–12.0)
Neutro Abs: 2.6 10*3/uL (ref 1.4–7.7)
Neutrophils Relative %: 44.8 % (ref 43.0–77.0)
Platelets: 252 10*3/uL (ref 150.0–400.0)
RBC: 4.19 Mil/uL (ref 3.87–5.11)
RDW: 16.5 % — ABNORMAL HIGH (ref 11.5–15.5)
WBC: 5.8 10*3/uL (ref 4.0–10.5)

## 2014-02-21 LAB — VITAMIN D 25 HYDROXY (VIT D DEFICIENCY, FRACTURES): VITD: 48.36 ng/mL (ref 30.00–100.00)

## 2014-02-21 LAB — LIPID PANEL
Cholesterol: 221 mg/dL — ABNORMAL HIGH (ref 0–200)
HDL: 48.6 mg/dL (ref >39.00)
LDL Cholesterol: 137 mg/dL — ABNORMAL HIGH (ref 0–99)
NonHDL: 172.4
Total CHOL/HDL Ratio: 5
Triglycerides: 179 mg/dL — ABNORMAL HIGH (ref 0.0–149.0)
VLDL: 35.8 mg/dL (ref 0.0–40.0)

## 2014-02-21 LAB — COMPREHENSIVE METABOLIC PANEL
ALT: 12 U/L (ref 0–35)
AST: 19 U/L (ref 0–37)
Albumin: 4 g/dL (ref 3.5–5.2)
Alkaline Phosphatase: 69 U/L (ref 39–117)
BUN: 11 mg/dL (ref 6–23)
CO2: 25 mEq/L (ref 19–32)
Calcium: 9.4 mg/dL (ref 8.4–10.5)
Chloride: 108 mEq/L (ref 96–112)
Creatinine, Ser: 0.9 mg/dL (ref 0.4–1.2)
GFR: 63.2 mL/min (ref >60.00)
Glucose, Bld: 95 mg/dL (ref 70–99)
Potassium: 4.4 mEq/L (ref 3.5–5.1)
Sodium: 139 mEq/L (ref 135–145)
Total Bilirubin: 0.4 mg/dL (ref 0.2–1.2)
Total Protein: 8.1 g/dL (ref 6.0–8.3)

## 2014-02-21 LAB — TSH: TSH: 3.8 u[IU]/mL (ref 0.35–4.50)

## 2014-02-23 ENCOUNTER — Ambulatory Visit (INDEPENDENT_AMBULATORY_CARE_PROVIDER_SITE_OTHER): Payer: Medicare Other | Admitting: Family Medicine

## 2014-02-23 ENCOUNTER — Encounter: Payer: Self-pay | Admitting: Family Medicine

## 2014-02-23 VITALS — BP 122/60 | HR 85 | Temp 98.3°F | Ht 61.5 in | Wt 138.5 lb

## 2014-02-23 DIAGNOSIS — E538 Deficiency of other specified B group vitamins: Secondary | ICD-10-CM

## 2014-02-23 DIAGNOSIS — M810 Age-related osteoporosis without current pathological fracture: Secondary | ICD-10-CM

## 2014-02-23 DIAGNOSIS — Z1211 Encounter for screening for malignant neoplasm of colon: Secondary | ICD-10-CM

## 2014-02-23 DIAGNOSIS — Z0000 Encounter for general adult medical examination without abnormal findings: Secondary | ICD-10-CM | POA: Diagnosis not present

## 2014-02-23 DIAGNOSIS — D649 Anemia, unspecified: Secondary | ICD-10-CM | POA: Diagnosis not present

## 2014-02-23 DIAGNOSIS — Z23 Encounter for immunization: Secondary | ICD-10-CM

## 2014-02-23 DIAGNOSIS — I10 Essential (primary) hypertension: Secondary | ICD-10-CM | POA: Diagnosis not present

## 2014-02-23 DIAGNOSIS — E785 Hyperlipidemia, unspecified: Secondary | ICD-10-CM | POA: Diagnosis not present

## 2014-02-23 DIAGNOSIS — E559 Vitamin D deficiency, unspecified: Secondary | ICD-10-CM

## 2014-02-23 MED ORDER — OMEPRAZOLE 20 MG PO CPDR: 20.0000 mg | DELAYED_RELEASE_CAPSULE | Freq: Every day | ORAL | Status: DC

## 2014-02-23 NOTE — Assessment & Plan Note (Signed)
D level in 73s  Enc to continue supplementation  Imp to bone and overall health

## 2014-02-23 NOTE — Patient Instructions (Signed)
Cholesterol is up  Avoid red meat/ fried foods/ egg yolks/ fatty breakfast meats/ butter, cheese and high fat dairy/ and shellfish   For acid reflux stop pepcid and start omeprazole (generic prilosec) one daily  If no improvement let me know  prevnar vaccine today  Work on your advanced directive (the blue packet) Keep exercising

## 2014-02-23 NOTE — Assessment & Plan Note (Signed)
Despite hx of vert fx-still declines dexa or tx Disc need for calcium/ vitamin D/ wt bearing exercise and bone density test every 2 y to monitor Disc safety/ fracture risk in detail   Rev safety/fall prev

## 2014-02-23 NOTE — Assessment & Plan Note (Signed)
Disc goals for lipids and reasons to control them Rev labs with pt Rev low sat fat diet in detail  This is up - disc foods to cut down on (cheeze/shrimp/butter)

## 2014-02-23 NOTE — Assessment & Plan Note (Signed)
Lab Results  Component Value Date   [id number] 704 02/21/2014   Stable  Cbc is stable  Will continue supplements

## 2014-02-23 NOTE — Assessment & Plan Note (Signed)
utd colonosc 2014 Has polyps- unsure if she will do a 5 year recall due to age Anemia is improved No symptoms

## 2014-02-23 NOTE — Assessment & Plan Note (Signed)
bp in fair control at this time  BP Readings from Last 1 Encounters:  02/23/14 122/60   No changes needed Disc lifstyle change with low sodium diet and exercise  Labs reviewed

## 2014-02-23 NOTE — Progress Notes (Signed)
Subjective:    Patient ID: Jenna Young, female    DOB: 08/16/1931, 80 y.o.   MRN: [id number]  HPI Here for annual medicare wellness visit and acute/chronic medical problems  I have personally reviewed the Medicare Annual Wellness questionnaire and have noted 1. The patient's medical and social history 2. Their use of alcohol, tobacco or illicit drugs 3. Their current medications and supplements 4. The patient's functional ability including ADL's, fall risks, home safety risks and hearing or visual             impairment. 5. Diet and physical activities 6. Evidence for depression or mood disorders  The patients weight, height, BMI have been recorded in the chart and visual acuity is per eye clinic.  I have made referrals, counseling and provided education to the patient based review of the above and I have provided the pt with a written personalized care plan for preventive services.  Is doing well overall  No new complaints except acid reflux -it is getting worse with age  On pepcid  Tried prilosec otc - 2 weeks ago - (held the pepcid)  On that alone - improved , just one episode when eating peanut butter She does not tolerate salads either - she regurgitates     See scanned forms.  Routine anticipatory guidance given to patient.  See health maintenance. Colon cancer screening 12/14 - had polyps - ? 5 year recall - will decide when the time comes  Breast cancer screening 10/15 nl  Self breast exam- no lumps Flu vaccine 10/15 Tetanus vaccine  Pneumovax 10/12  Zoster vaccine- she declines this vaccine   Advance directive= does not have one - given packet to work on that today  Cognitive function addressed- see scanned forms- and if abnormal then additional documentation follows.  No concerns    PMH and SH reviewed  Meds, vitals, and allergies reviewed.   ROS: See HPI.  Otherwise negative.     Hx of OP - had a fractured vertebrae this year  She still declines any  tx or further dexa   D def- level 48   bp is stable today  No cp or palpitations or headaches or edema  No side effects to medicines  BP Readings from Last 3 Encounters:  02/23/14 122/60  12/15/13 136/54  10/18/13 134/60     Wt is up 2 lb with bmi of 25  Chronic anemia  Lab Results  Component Value Date   WBC 5.8 02/21/2014   HGB 11.2* 02/21/2014   HCT 34.1* 02/21/2014   MCV 81.5 02/21/2014   PLT 252.0 02/21/2014    Has had all her life and has had colonoscopies    Hyperlipidemia  Lab Results  Component Value Date   CHOL 221* 02/21/2014   CHOL 207* 01/27/2013   CHOL 194 01/10/2012   Lab Results  Component Value Date   HDL 48.60 02/21/2014   HDL 59.80 01/27/2013   HDL 56.80 01/10/2012   Lab Results  Component Value Date   LDLCALC 137* 02/21/2014   LDLCALC 119* 01/10/2012   LDLCALC * 11/14/2008    123        Total Cholesterol/HDL:CHD Risk Coronary Heart Disease Risk Table                     Men   Women  1/2 Average Risk   3.4   3.3  Average Risk       5.0   4.4  2 X Average Risk   9.6   7.1  3 X Average Risk  23.4   11.0        Use the calculated Patient Ratio above and the CHD Risk Table to determine the patient's CHD Risk.        ATP III CLASSIFICATION (LDL):  <100     mg/dL   Optimal  100-129  mg/dL   Near or Above                    Optimal  130-159  mg/dL   Borderline  160-189  mg/dL   High  >190     mg/dL   Very High   Lab Results  Component Value Date   TRIG 179.0* 02/21/2014   TRIG 123.0 01/27/2013   TRIG 93.0 01/10/2012   Lab Results  Component Value Date   CHOLHDL 5 02/21/2014   CHOLHDL 3 01/27/2013   CHOLHDL 3 01/10/2012   Lab Results  Component Value Date   LDLDIRECT 116.6 01/27/2013   LDLDIRECT 114.5 12/04/2010   LDLDIRECT 117.1 04/23/2010   her HDL went down despite more exercise  Diet - did not think had changed  Not a lot of fatty foods  Does eat butter and cheese   Patient Active Problem List   Diagnosis Date  Noted  . Abnormal urinalysis 09/20/2013  . Back contusion 09/20/2013  . Dysthymia 09/20/2013  . Encounter for Medicare annual wellness exam 02/03/2013  . Vaso-vagal reaction 09/25/2012  . BPPV (benign paroxysmal positional vertigo) 06/15/2012  . Chronic right ear pain 03/16/2012  . Colon cancer screening 01/17/2012  . Anemia 01/17/2012  . Vertigo 11/25/2011  . GRIEF REACTION 08/01/2009  . Vitamin D deficiency 08/19/2008  . ANXIETY 01/29/2008  . Essential hypertension, benign 09/25/2007  . B12 deficiency 09/24/2006  . Hyperlipidemia 09/24/2006  . VENOUS INSUFFICIENCY 09/24/2006  . ALLERGIC RHINITIS 09/24/2006  . GERD 09/24/2006  . Osteoporosis 09/24/2006   Past Medical History  Diagnosis Date  . GERD (gastroesophageal reflux disease)   . Allergic rhinitis   . Hyperlipidemia   . Osteopenia   . Osteoporosis   . Anxiety   . Hypertension   . Anemia    Past Surgical History  Procedure Laterality Date  . Appendectomy    . Cesarean section    . Cholecystectomy    . Ovarian cyst surgery    . Fracture surgery  07/2008    neck  . Breast surgery  10/10    benign biopsy  negative   History  Substance Use Topics  . Smoking status: Former Smoker    Quit date: 03/11/1968  . Smokeless tobacco: Never Used  . Alcohol Use: No   Family History  Problem Relation Age of Onset  . Hypertension Mother   . Heart disease Father   . Macular degeneration Sister   . Diabetes Maternal Aunt   . Cancer Maternal Aunt     throat   Allergies  Allergen Reactions  . Buspirone Hcl     REACTION: HEART PALPITATIONS  . Codeine     REACTION: nausea and vomiting  . Hydrochlorothiazide     REACTION: syncope-possibly from dehydration  . Lansoprazole     REACTION: abd pain  . Minocycline Hcl   . Penicillins     REACTION: mouth numbness  . Ramipril     Cough   . Sulfa Antibiotics Nausea Only   Current Outpatient Prescriptions on File Prior to Visit  Medication Sig Dispense  Refill  .  amLODipine (NORVASC) 10 MG tablet TAKE 1 TABLET BY MOUTH EVERY DAY 30 tablet 5  . aspirin (ASPIRIN EC) 81 MG EC tablet Take 81 mg by mouth daily. Swallow whole.    . calcium-vitamin D (OSCAL WITH D) 500-200 MG-UNIT per tablet Take 2 tablets by mouth daily.      . cetirizine (ZYRTEC) 10 MG tablet Take 10 mg by mouth as needed.     . Cholecalciferol (VITAMIN D) 2000 UNITS CAPS Take 1 capsule by mouth daily.      . cyanocobalamin (,VITAMIN B-12,) 1000 MCG/ML injection Inject 1 mL (1,000 mcg total) into the muscle every 8 (eight) weeks. One injection every 2 months 1 mL 5  . DimenhyDRINATE (DRAMAMINE) 50 MG CHEW Chew 1 tablet by mouth as needed.      . Famotidine (PEPCID PO) Take 1 tablet by mouth daily as needed.      . fluticasone (FLONASE) 50 MCG/ACT nasal spray Place 2 sprays into both nostrils daily. 16 g 11  . hyoscyamine (LEVSIN SL) 0.125 MG SL tablet Place 1 tablet (0.125 mg total) under the tongue every 6 (six) hours as needed for cramping. 15 tablet 0  . losartan (COZAAR) 100 MG tablet TAKE 1 TABLET BY MOUTH EVERY DAY 30 tablet 5  . meclizine (ANTIVERT) 25 MG tablet Take 1 tablet (25 mg total) by mouth 3 (three) times daily as needed for dizziness. 30 tablet 3  . pramoxine (PROCTOFOAM) 1 % foam Place 1 application rectally 3 (three) times daily as needed for itching. 15 g 0  . traMADol (ULTRAM) 50 MG tablet Take 1 tablet (50 mg total) by mouth every 8 (eight) hours as needed. 30 tablet 0   No current facility-administered medications on file prior to visit.       Review of Systems Review of Systems  Constitutional: Negative for fever, appetite change, fatigue and unexpected weight change.  Eyes: Negative for pain and visual disturbance.  Respiratory: Negative for cough and shortness of breath.   Cardiovascular: Negative for cp or palpitations    Gastrointestinal: Negative for nausea, diarrhea and constipation. pos for heartburn/ indigestion / neg for blood in stool or dark stool    Genitourinary: Negative for urgency and frequency.  Skin: Negative for pallor or rash   Neurological: Negative for weakness, light-headedness, numbness and headaches.  Hematological: Negative for adenopathy. Does not bruise/bleed easily.  Psychiatric/Behavioral: Negative for dysphoric mood. The patient is not nervous/anxious.         Objective:   Physical Exam  Constitutional: She appears well-developed and well-nourished. No distress.  overwt and well app  HENT:  Head: Normocephalic and atraumatic.  Right Ear: External ear normal.  Left Ear: External ear normal.  Mouth/Throat: Oropharynx is clear and moist.  Eyes: Conjunctivae and EOM are normal. Pupils are equal, round, and reactive to light. No scleral icterus.  Neck: Normal range of motion. Neck supple. No JVD present. Carotid bruit is not present. No thyromegaly present.  Cardiovascular: Normal rate, regular rhythm, normal heart sounds and intact distal pulses.  Exam reveals no gallop.   Pulmonary/Chest: Effort normal and breath sounds normal. No respiratory distress. She has no wheezes. She exhibits no tenderness.  Abdominal: Soft. Bowel sounds are normal. She exhibits no distension, no abdominal bruit and no mass. There is no tenderness.  Genitourinary: No breast swelling, tenderness, discharge or bleeding.  Breast exam: No mass, nodules, thickening, tenderness, bulging, retraction, inflamation, nipple discharge or skin changes noted.  No  axillary or clavicular LA.      Musculoskeletal: Normal range of motion. She exhibits no edema or tenderness.  Lymphadenopathy:    She has no cervical adenopathy.  Neurological: She is alert. She has normal reflexes. No cranial nerve deficit. She exhibits normal muscle tone. Coordination normal.  Skin: Skin is warm and dry. No rash noted. No erythema. No pallor.  Psychiatric: She has a normal mood and affect.          Assessment & Plan:   Problem List Items Addressed This Visit       Cardiovascular and Mediastinum   Essential hypertension, benign    bp in fair control at this time  BP Readings from Last 1 Encounters:  02/23/14 122/60   No changes needed Disc lifstyle change with low sodium diet and exercise  Labs reviewed       Digestive   B12 deficiency    Lab Results  Component Value Date   [id number] 098 02/21/2014   Stable  Cbc is stable  Will continue supplements       Musculoskeletal and Integument   Osteoporosis    Despite hx of vert fx-still declines dexa or tx Disc need for calcium/ vitamin D/ wt bearing exercise and bone density test every 2 y to monitor Disc safety/ fracture risk in detail   Rev safety/fall prev       Other   Anemia    Stable to improved  Rev last colonoscopy  Will continue to watch Lab Results  Component Value Date   WBC 5.8 02/21/2014   HGB 11.2* 02/21/2014   HCT 34.1* 02/21/2014   MCV 81.5 02/21/2014   PLT 252.0 02/21/2014       Colon cancer screening    utd colonosc 2014 Has polyps- unsure if she will do a 5 year recall due to age Anemia is improved No symptoms     Encounter for Medicare annual wellness exam - Primary    Reviewed health habits including diet and exercise and skin cancer prevention Reviewed appropriate screening tests for age  Also reviewed health mt list, fam hx and immunization status , as well as social and family history   See HPI Disc imp of adv directive -packet given and rev this  prevnar vaccine today  Labs rev  Declines dexa     Hyperlipidemia    Disc goals for lipids and reasons to control them Rev labs with pt Rev low sat fat diet in detail  This is up - disc foods to cut down on (cheeze/shrimp/butter)    Vitamin D deficiency    D level in 76s  Enc to continue supplementation  Imp to bone and overall health      Other Visit Diagnoses    Need for vaccination with 13-polyvalent pneumococcal conjugate vaccine        Relevant Orders       Pneumococcal conjugate  vaccine 13-valent (Completed)

## 2014-02-23 NOTE — Assessment & Plan Note (Signed)
Reviewed health habits including diet and exercise and skin cancer prevention Reviewed appropriate screening tests for age  Also reviewed health mt list, fam hx and immunization status , as well as social and family history   See HPI Disc imp of adv directive -packet given and rev this  prevnar vaccine today  Labs rev  Declines dexa

## 2014-02-23 NOTE — Progress Notes (Signed)
Pre visit review using our clinic review tool, if applicable. No additional management support is needed unless otherwise documented below in the visit note. 

## 2014-02-23 NOTE — Assessment & Plan Note (Signed)
Stable to improved  Rev last colonoscopy  Will continue to watch Lab Results  Component Value Date   WBC 5.8 02/21/2014   HGB 11.2* 02/21/2014   HCT 34.1* 02/21/2014   MCV 81.5 02/21/2014   PLT 252.0 02/21/2014

## 2014-03-30 ENCOUNTER — Ambulatory Visit (INDEPENDENT_AMBULATORY_CARE_PROVIDER_SITE_OTHER): Payer: Medicare Other | Admitting: *Deleted

## 2014-03-30 ENCOUNTER — Ambulatory Visit: Payer: Medicare Other

## 2014-03-30 DIAGNOSIS — E538 Deficiency of other specified B group vitamins: Secondary | ICD-10-CM | POA: Diagnosis not present

## 2014-03-30 MED ORDER — CYANOCOBALAMIN 1000 MCG/ML IJ SOLN
1000.0000 ug | Freq: Once | INTRAMUSCULAR | Status: AC
  Administered 2014-03-30: 1000 ug via INTRAMUSCULAR

## 2014-04-07 DIAGNOSIS — I10 Essential (primary) hypertension: Secondary | ICD-10-CM | POA: Diagnosis not present

## 2014-04-07 DIAGNOSIS — H52223 Regular astigmatism, bilateral: Secondary | ICD-10-CM | POA: Diagnosis not present

## 2014-04-07 DIAGNOSIS — H2513 Age-related nuclear cataract, bilateral: Secondary | ICD-10-CM | POA: Diagnosis not present

## 2014-04-07 DIAGNOSIS — H524 Presbyopia: Secondary | ICD-10-CM | POA: Diagnosis not present

## 2014-04-07 DIAGNOSIS — H5213 Myopia, bilateral: Secondary | ICD-10-CM | POA: Diagnosis not present

## 2014-05-31 ENCOUNTER — Ambulatory Visit (INDEPENDENT_AMBULATORY_CARE_PROVIDER_SITE_OTHER): Payer: Medicare Other | Admitting: *Deleted

## 2014-05-31 DIAGNOSIS — D519 Vitamin B12 deficiency anemia, unspecified: Secondary | ICD-10-CM

## 2014-05-31 MED ORDER — CYANOCOBALAMIN 1000 MCG/ML IJ SOLN
1000.0000 ug | Freq: Once | INTRAMUSCULAR | Status: AC
  Administered 2014-05-31: 1000 ug via INTRAMUSCULAR

## 2014-07-15 ENCOUNTER — Other Ambulatory Visit: Payer: Self-pay | Admitting: Family Medicine

## 2014-07-20 DIAGNOSIS — H1045 Other chronic allergic conjunctivitis: Secondary | ICD-10-CM | POA: Diagnosis not present

## 2014-08-01 ENCOUNTER — Other Ambulatory Visit: Payer: Self-pay | Admitting: Family Medicine

## 2014-08-02 DIAGNOSIS — H2513 Age-related nuclear cataract, bilateral: Secondary | ICD-10-CM | POA: Diagnosis not present

## 2014-08-02 DIAGNOSIS — H5213 Myopia, bilateral: Secondary | ICD-10-CM | POA: Diagnosis not present

## 2014-08-02 DIAGNOSIS — H40053 Ocular hypertension, bilateral: Secondary | ICD-10-CM | POA: Diagnosis not present

## 2014-08-02 DIAGNOSIS — H524 Presbyopia: Secondary | ICD-10-CM | POA: Diagnosis not present

## 2014-08-02 DIAGNOSIS — H52223 Regular astigmatism, bilateral: Secondary | ICD-10-CM | POA: Diagnosis not present

## 2014-08-02 DIAGNOSIS — I10 Essential (primary) hypertension: Secondary | ICD-10-CM | POA: Diagnosis not present

## 2014-08-17 ENCOUNTER — Ambulatory Visit (INDEPENDENT_AMBULATORY_CARE_PROVIDER_SITE_OTHER): Payer: Medicare Other | Admitting: Primary Care

## 2014-08-17 ENCOUNTER — Encounter: Payer: Self-pay | Admitting: Primary Care

## 2014-08-17 VITALS — BP 120/60 | HR 77 | Temp 98.2°F | Ht 61.5 in | Wt 140.4 lb

## 2014-08-17 DIAGNOSIS — K219 Gastro-esophageal reflux disease without esophagitis: Secondary | ICD-10-CM

## 2014-08-17 NOTE — Patient Instructions (Signed)
Your cough is likely related to your acid reflux. Increase your omeprazole to 1 tablet twice daily for 2 weeks, then take 1 tablet by mouth daily. Follow up if no improvement in cough within 2 weeks. It was nice meeting you!  Gastroesophageal Reflux Disease, Adult Gastroesophageal reflux disease (GERD) happens when acid from your stomach flows up into the esophagus. When acid comes in contact with the esophagus, the acid causes soreness (inflammation) in the esophagus. Over time, GERD may create small holes (ulcers) in the lining of the esophagus. CAUSES   Increased body weight. This puts pressure on the stomach, making acid rise from the stomach into the esophagus.  Smoking. This increases acid production in the stomach.  Drinking alcohol. This causes decreased pressure in the lower esophageal sphincter (valve or ring of muscle between the esophagus and stomach), allowing acid from the stomach into the esophagus.  Late evening meals and a full stomach. This increases pressure and acid production in the stomach.  A malformed lower esophageal sphincter. Sometimes, no cause is found. SYMPTOMS   Burning pain in the lower part of the mid-chest behind the breastbone and in the mid-stomach area. This may occur twice a week or more often.  Trouble swallowing.  Sore throat.  Dry cough.  Asthma-like symptoms including chest tightness, shortness of breath, or wheezing. DIAGNOSIS  Your caregiver may be able to diagnose GERD based on your symptoms. In some cases, X-rays and other tests may be done to check for complications or to check the condition of your stomach and esophagus. TREATMENT  Your caregiver may recommend over-the-counter or prescription medicines to help decrease acid production. Ask your caregiver before starting or adding any new medicines.  HOME CARE INSTRUCTIONS   Change the factors that you can control. Ask your caregiver for guidance concerning weight loss, quitting  smoking, and alcohol consumption.  Avoid foods and drinks that make your symptoms worse, such as:  Caffeine or alcoholic drinks.  Chocolate.  Peppermint or mint flavorings.  Garlic and onions.  Spicy foods.  Citrus fruits, such as oranges, lemons, or limes.  Tomato-based foods such as sauce, chili, salsa, and pizza.  Fried and fatty foods.  Avoid lying down for the 3 hours prior to your bedtime or prior to taking a nap.  Eat small, frequent meals instead of large meals.  Wear loose-fitting clothing. Do not wear anything tight around your waist that causes pressure on your stomach.  Raise the head of your bed 6 to 8 inches with wood blocks to help you sleep. Extra pillows will not help.  Only take over-the-counter or prescription medicines for pain, discomfort, or fever as directed by your caregiver.  Do not take aspirin, ibuprofen, or other nonsteroidal anti-inflammatory drugs (NSAIDs). SEEK IMMEDIATE MEDICAL CARE IF:   You have pain in your arms, neck, jaw, teeth, or back.  Your pain increases or changes in intensity or duration.  You develop nausea, vomiting, or sweating (diaphoresis).  You develop shortness of breath, or you faint.  Your vomit is green, yellow, black, or looks like coffee grounds or blood.  Your stool is red, bloody, or black. These symptoms could be signs of other problems, such as heart disease, gastric bleeding, or esophageal bleeding. MAKE SURE YOU:   Understand these instructions.  Will watch your condition.  Will get help right away if you are not doing well or get worse. Document Released: 12/05/2004 Document Revised: 05/20/2011 Document Reviewed: 09/14/2010 [facility] Patient Information 2015 [place], [place]. This information  is not intended to replace advice given to you by your health care provider. Make sure you discuss any questions you have with your health care provider.  

## 2014-08-17 NOTE — Progress Notes (Signed)
Pre visit review using our clinic review tool, if applicable. No additional management support is needed unless otherwise documented below in the visit note. 

## 2014-08-17 NOTE — Assessment & Plan Note (Addendum)
Suspect cough related to worsening GERD. HEENT exam unremarkable and does not appear acutely ill. Increase Omeprazole to 1 tablet twice daily x 2 weeks. Provided education regarding triggers for reflux. She is to call with an update in 2 weeks if no improvement.

## 2014-08-17 NOTE — Progress Notes (Signed)
Subjective:    Patient ID: Jenna Young, female    DOB: [birth year], 81 y.o.   MRN: [id number]  HPI  [name] is an 81 year old female who presents today with a chief complaint of cough. She first noticed her cough 7-10 days ago and is non productive. Denies fevers, sore throat, nasal congestion, shortness of breath. She has a history of acid reflux and is managed on omeprazole 20 mg tablets. She does occasionally experience reflux of gastric contents despite taking omeprazole. Overall she feels well and not acutely ill.  Review of Systems  Constitutional: Negative for fever and chills.  HENT: Negative for ear pain, rhinorrhea and sore throat.   Respiratory: Negative for cough and shortness of breath.   Cardiovascular: Negative for chest pain.  Gastrointestinal: Negative for nausea and abdominal pain.  Musculoskeletal: Negative for myalgias.       Past Medical History  Diagnosis Date  . GERD (gastroesophageal reflux disease)   . Allergic rhinitis   . Hyperlipidemia   . Osteopenia   . Osteoporosis   . Anxiety   . Hypertension   . Anemia     History   Social History  . Marital Status: Married    Spouse Name: N/A  . Number of Children: 1  . Years of Education: N/A   Occupational History  .     Social History Main Topics  . Smoking status: Former Smoker    Quit date: 03/11/1968  . Smokeless tobacco: Never Used  . Alcohol Use: No  . Drug Use: No  . Sexual Activity: Not on file   Other Topics Concern  . Not on file   Social History Narrative    Past Surgical History  Procedure Laterality Date  . Appendectomy    . Cesarean section    . Cholecystectomy    . Ovarian cyst surgery    . Fracture surgery  07/2008    neck  . Breast surgery  10/10    benign biopsy  negative    Family History  Problem Relation Age of Onset  . Hypertension Mother   . Heart disease Father   . Macular degeneration Sister   . Diabetes Maternal Aunt   . Cancer Maternal Aunt       throat    Allergies  Allergen Reactions  . Buspirone Hcl     REACTION: HEART PALPITATIONS  . Codeine     REACTION: nausea and vomiting  . Hydrochlorothiazide     REACTION: syncope-possibly from dehydration  . Lansoprazole     REACTION: abd pain  . Minocycline Hcl   . Penicillins     REACTION: mouth numbness  . Ramipril     Cough   . Sulfa Antibiotics Nausea Only    Current Outpatient Prescriptions on File Prior to Visit  Medication Sig Dispense Refill  . amLODipine (NORVASC) 10 MG tablet TAKE 1 TABLET BY MOUTH EVERY DAY 30 tablet 5  . aspirin (ASPIRIN EC) 81 MG EC tablet Take 81 mg by mouth daily. Swallow whole.    . calcium-vitamin D (OSCAL WITH D) 500-200 MG-UNIT per tablet Take 2 tablets by mouth daily.      . cetirizine (ZYRTEC) 10 MG tablet Take 10 mg by mouth as needed.     . Cholecalciferol (VITAMIN D) 2000 UNITS CAPS Take 1 capsule by mouth daily.      . DimenhyDRINATE (DRAMAMINE) 50 MG CHEW Chew 1 tablet by mouth as needed.      Marland Kitchen  fluticasone (FLONASE) 50 MCG/ACT nasal spray Place 2 sprays into both nostrils daily. 16 g 11  . hyoscyamine (LEVSIN SL) 0.125 MG SL tablet Place 1 tablet (0.125 mg total) under the tongue every 6 (six) hours as needed for cramping. 15 tablet 0  . losartan (COZAAR) 100 MG tablet TAKE 1 TABLET BY MOUTH EVERY DAY 30 tablet 5  . meclizine (ANTIVERT) 25 MG tablet Take 1 tablet (25 mg total) by mouth 3 (three) times daily as needed for dizziness. 30 tablet 3  . omeprazole (PRILOSEC) 20 MG capsule Take 1 capsule (20 mg total) by mouth daily. 30 capsule 11  . pramoxine (PROCTOFOAM) 1 % foam Place 1 application rectally 3 (three) times daily as needed for itching. 15 g 0  . traMADol (ULTRAM) 50 MG tablet Take 1 tablet (50 mg total) by mouth every 8 (eight) hours as needed. 30 tablet 0   No current facility-administered medications on file prior to visit.    BP 120/60 mmHg  Pulse 77  Temp(Src) 98.2 F (36.8 C) (Oral)  Ht 5' 1.5" (1.562 m)   Wt 140 lb 6.4 oz (63.685 kg)  BMI 26.10 kg/m2  SpO2 95%    Objective:   Physical Exam  Constitutional: She does not appear ill.  HENT:  Right Ear: Tympanic membrane and ear canal normal.  Left Ear: Tympanic membrane and ear canal normal.  Nose: Nose normal.  Mouth/Throat: Oropharynx is clear and moist.  Neck: Neck supple.  Cardiovascular: Normal rate and regular rhythm.   Pulmonary/Chest: Effort normal and breath sounds normal.  Lymphadenopathy:    She has no cervical adenopathy.  Skin: Skin is warm and dry.          Assessment & Plan:

## 2014-09-02 ENCOUNTER — Ambulatory Visit (INDEPENDENT_AMBULATORY_CARE_PROVIDER_SITE_OTHER): Payer: Medicare Other

## 2014-09-02 DIAGNOSIS — E538 Deficiency of other specified B group vitamins: Secondary | ICD-10-CM | POA: Diagnosis not present

## 2014-09-02 MED ORDER — CYANOCOBALAMIN 1000 MCG/ML IJ SOLN
1000.0000 ug | Freq: Once | INTRAMUSCULAR | Status: AC
  Administered 2014-09-02: 1000 ug via INTRAMUSCULAR

## 2014-11-08 ENCOUNTER — Ambulatory Visit (INDEPENDENT_AMBULATORY_CARE_PROVIDER_SITE_OTHER): Payer: Medicare Other | Admitting: Family Medicine

## 2014-11-08 ENCOUNTER — Encounter: Payer: Self-pay | Admitting: Family Medicine

## 2014-11-08 VITALS — BP 134/60 | HR 78 | Temp 97.7°F | Ht 61.5 in | Wt 140.5 lb

## 2014-11-08 DIAGNOSIS — D170 Benign lipomatous neoplasm of skin and subcutaneous tissue of head, face and neck: Secondary | ICD-10-CM | POA: Insufficient documentation

## 2014-11-08 NOTE — Progress Notes (Signed)
Pre visit review using our clinic review tool, if applicable. No additional management support is needed unless otherwise documented below in the visit note. 

## 2014-11-08 NOTE — Progress Notes (Signed)
Subjective:    Patient ID: Jenna Young, female    DOB: 1931-04-28, 81 y.o.   MRN: [id number]  HPI Here with a "knot" on her forehead No trauma  Noticed it last week  Not painful or sore to the touch No redness and no drainage   Some sneezing  No sinus pain or pressure (at least not persistant)  Patient Active Problem List   Diagnosis Date Noted  . Abnormal urinalysis 09/20/2013  . Back contusion 09/20/2013  . Dysthymia 09/20/2013  . Encounter for Medicare annual wellness exam 02/03/2013  . Vaso-vagal reaction 09/25/2012  . BPPV (benign paroxysmal positional vertigo) 06/15/2012  . Chronic right ear pain 03/16/2012  . Colon cancer screening 01/17/2012  . Anemia 01/17/2012  . Vertigo 11/25/2011  . GRIEF REACTION 08/01/2009  . Vitamin D deficiency 08/19/2008  . ANXIETY 01/29/2008  . Essential hypertension, benign 09/25/2007  . B12 deficiency 09/24/2006  . Hyperlipidemia 09/24/2006  . VENOUS INSUFFICIENCY 09/24/2006  . ALLERGIC RHINITIS 09/24/2006  . GERD 09/24/2006  . Osteoporosis 09/24/2006   Past Medical History  Diagnosis Date  . GERD (gastroesophageal reflux disease)   . Allergic rhinitis   . Hyperlipidemia   . Osteopenia   . Osteoporosis   . Anxiety   . Hypertension   . Anemia    Past Surgical History  Procedure Laterality Date  . Appendectomy    . Cesarean section    . Cholecystectomy    . Ovarian cyst surgery    . Fracture surgery  07/2008    neck  . Breast surgery  10/10    benign biopsy  negative   Social History  Substance Use Topics  . Smoking status: Former Smoker    Quit date: 03/11/1968  . Smokeless tobacco: Never Used  . Alcohol Use: No   Family History  Problem Relation Age of Onset  . Hypertension Mother   . Heart disease Father   . Macular degeneration Sister   . Diabetes Maternal Aunt   . Cancer Maternal Aunt     throat   Allergies  Allergen Reactions  . Buspirone Hcl     REACTION: HEART PALPITATIONS  . Codeine    REACTION: nausea and vomiting  . Hydrochlorothiazide     REACTION: syncope-possibly from dehydration  . Lansoprazole     REACTION: abd pain  . Minocycline Hcl   . Penicillins     REACTION: mouth numbness  . Ramipril     Cough   . Sulfa Antibiotics Nausea Only   Current Outpatient Prescriptions on File Prior to Visit  Medication Sig Dispense Refill  . amLODipine (NORVASC) 10 MG tablet TAKE 1 TABLET BY MOUTH EVERY DAY 30 tablet 5  . aspirin (ASPIRIN EC) 81 MG EC tablet Take 81 mg by mouth daily. Swallow whole.    . calcium-vitamin D (OSCAL WITH D) 500-200 MG-UNIT per tablet Take 2 tablets by mouth daily.      . cetirizine (ZYRTEC) 10 MG tablet Take 10 mg by mouth as needed.     . Cholecalciferol (VITAMIN D) 2000 UNITS CAPS Take 1 capsule by mouth daily.      . DimenhyDRINATE (DRAMAMINE) 50 MG CHEW Chew 1 tablet by mouth as needed.      Marland Kitchen losartan (COZAAR) 100 MG tablet TAKE 1 TABLET BY MOUTH EVERY DAY 30 tablet 5  . meclizine (ANTIVERT) 25 MG tablet Take 1 tablet (25 mg total) by mouth 3 (three) times daily as needed for dizziness. 30 tablet 3  .  omeprazole (PRILOSEC) 20 MG capsule Take 1 capsule (20 mg total) by mouth daily. 30 capsule 11  . fluticasone (FLONASE) 50 MCG/ACT nasal spray Place 2 sprays into both nostrils daily. (Patient not taking: Reported on 11/08/2014) 16 g 11   No current facility-administered medications on file prior to visit.      Review of Systems Review of Systems  Constitutional: Negative for fever, appetite change, fatigue and unexpected weight change.  Eyes: Negative for pain and visual disturbance.  Respiratory: Negative for cough and shortness of breath.   Cardiovascular: Negative for cp or palpitations    Gastrointestinal: Negative for nausea, diarrhea and constipation.  Genitourinary: Negative for urgency and frequency.  Skin: Negative for pallor or rash   Neurological: Negative for weakness, light-headedness, numbness and headaches.    Hematological: Negative for adenopathy. Does not bruise/bleed easily.  Psychiatric/Behavioral: Negative for dysphoric mood. The patient is not nervous/anxious.         Objective:   Physical Exam  Constitutional: She appears well-developed and well-nourished. No distress.  HENT:  Head: Normocephalic and atraumatic.  Nose: Nose normal.  Mouth/Throat: Oropharynx is clear and moist.  Eyes: Conjunctivae and EOM are normal. Pupils are equal, round, and reactive to light. Right eye exhibits no discharge. Left eye exhibits no discharge. No scleral icterus.  Neck: Normal range of motion. Neck supple.  Cardiovascular: Normal rate and regular rhythm.   Musculoskeletal: She exhibits no edema.  Lymphadenopathy:    She has no cervical adenopathy.  Neurological: She is alert. She has normal reflexes. No cranial nerve deficit.  Skin: Skin is warm and dry. No rash noted. No erythema. No pallor.  1 cm mobile nt lump over R medial brow  Feels fatty in texture  No redness or drainage  Suspect lipoma   Psychiatric: She has a normal mood and affect.          Assessment & Plan:   Problem List Items Addressed This Visit    Lipoma of face - Primary    1 cm lump that feels fatty in texture at R brow bone medially  nt / no s/s of infection Ref to derm for verification of lipoma - since it is not bothersome pt is not interested in removal unless necessary  Enc to update if this changes in any way or becomes painful or red       Relevant Orders   Ambulatory referral to Dermatology

## 2014-11-08 NOTE — Patient Instructions (Signed)
Stop at check out for referral to dermatology for area on forehead I suspect it is a fatty deposit called a lipoma - and not worrisome

## 2014-11-08 NOTE — Assessment & Plan Note (Signed)
1 cm lump that feels fatty in texture at R brow bone medially  nt / no s/s of infection Ref to derm for verification of lipoma - since it is not bothersome pt is not interested in removal unless necessary  Enc to update if this changes in any way or becomes painful or red

## 2014-12-06 ENCOUNTER — Ambulatory Visit (INDEPENDENT_AMBULATORY_CARE_PROVIDER_SITE_OTHER): Payer: Medicare Other | Admitting: *Deleted

## 2014-12-06 DIAGNOSIS — E538 Deficiency of other specified B group vitamins: Secondary | ICD-10-CM | POA: Diagnosis not present

## 2014-12-06 MED ORDER — CYANOCOBALAMIN 1000 MCG/ML IJ SOLN
1000.0000 ug | Freq: Once | INTRAMUSCULAR | Status: AC
  Administered 2014-12-06: 1000 ug via INTRAMUSCULAR

## 2014-12-19 ENCOUNTER — Other Ambulatory Visit: Payer: Self-pay

## 2014-12-19 DIAGNOSIS — Z1231 Encounter for screening mammogram for malignant neoplasm of breast: Secondary | ICD-10-CM

## 2014-12-22 ENCOUNTER — Ambulatory Visit (INDEPENDENT_AMBULATORY_CARE_PROVIDER_SITE_OTHER): Payer: Medicare Other

## 2014-12-22 DIAGNOSIS — Z23 Encounter for immunization: Secondary | ICD-10-CM | POA: Diagnosis not present

## 2014-12-30 ENCOUNTER — Ambulatory Visit
Admission: RE | Admit: 2014-12-30 | Discharge: 2014-12-30 | Disposition: A | Discharge status: Home / Self Care | Payer: Medicare Other | Source: Ambulatory Visit

## 2014-12-30 DIAGNOSIS — Z1231 Encounter for screening mammogram for malignant neoplasm of breast: Secondary | ICD-10-CM | POA: Diagnosis not present

## 2015-01-18 ENCOUNTER — Other Ambulatory Visit: Payer: Self-pay | Admitting: Family Medicine

## 2015-01-24 DIAGNOSIS — L814 Other melanin hyperpigmentation: Secondary | ICD-10-CM | POA: Diagnosis not present

## 2015-01-24 DIAGNOSIS — R234 Changes in skin texture: Secondary | ICD-10-CM | POA: Diagnosis not present

## 2015-01-24 DIAGNOSIS — L739 Follicular disorder, unspecified: Secondary | ICD-10-CM | POA: Diagnosis not present

## 2015-01-24 DIAGNOSIS — L821 Other seborrheic keratosis: Secondary | ICD-10-CM | POA: Diagnosis not present

## 2015-01-24 DIAGNOSIS — L820 Inflamed seborrheic keratosis: Secondary | ICD-10-CM | POA: Diagnosis not present

## 2015-02-10 ENCOUNTER — Telehealth: Payer: Self-pay

## 2015-02-10 NOTE — Telephone Encounter (Signed)
Pt started with diarrhea 02/08/15; last 24 hrs had diarrhea x 3;last time was 8:30 AM this morning. Watery diarrhea, no mucus or blood; no abd pain and no fever. Pt had vomiting on 02/05/15 and no vomiting since. Pt has taken Pepto Bismol with no relief. Pt has not tried Immodium. Pt request med to [place] request cb.

## 2015-02-10 NOTE — Telephone Encounter (Signed)
Pt hasn't been to hospital or had any abx, pt hasn't had any blood in stool or abd pain so she will try to immodium and if it doesn't help she will f/u with either our sat clinic or Dr. Glori Bickers next week

## 2015-02-10 NOTE — Telephone Encounter (Signed)
Let me know if she has been on abx lately or been in a hospital for any reason (would test for c diff) Also let me know if any abdominal pain or if blood in stool develops Keep up fluids (sips) to prevent dehydration   Can try a dose of immodium to see if this helps as long as no abd pain   F/u next week if no imp over the weekend (or sat clinic if she worsens)

## 2015-02-13 ENCOUNTER — Other Ambulatory Visit: Payer: Self-pay | Admitting: Family Medicine

## 2015-02-13 NOTE — Telephone Encounter (Signed)
Received refill request electronically Last office visit 11/08/14 See allergy/contraindication Is it okay to refill medication?

## 2015-02-13 NOTE — Telephone Encounter (Signed)
It is ok  Please refill for a year  

## 2015-02-14 NOTE — Telephone Encounter (Signed)
done

## 2015-03-09 ENCOUNTER — Ambulatory Visit (INDEPENDENT_AMBULATORY_CARE_PROVIDER_SITE_OTHER): Payer: Medicare Other

## 2015-03-09 ENCOUNTER — Encounter: Payer: Self-pay | Admitting: *Deleted

## 2015-03-09 DIAGNOSIS — E538 Deficiency of other specified B group vitamins: Secondary | ICD-10-CM | POA: Diagnosis not present

## 2015-03-09 MED ORDER — CYANOCOBALAMIN 1000 MCG/ML IJ SOLN
1000.0000 ug | Freq: Once | INTRAMUSCULAR | Status: AC
  Administered 2015-03-09: 1000 ug via INTRAMUSCULAR

## 2015-03-13 ENCOUNTER — Other Ambulatory Visit: Payer: Self-pay | Admitting: Family Medicine

## 2015-03-16 ENCOUNTER — Telehealth: Payer: Self-pay | Admitting: Family Medicine

## 2015-03-16 DIAGNOSIS — E559 Vitamin D deficiency, unspecified: Secondary | ICD-10-CM

## 2015-03-16 DIAGNOSIS — E538 Deficiency of other specified B group vitamins: Secondary | ICD-10-CM

## 2015-03-16 DIAGNOSIS — I10 Essential (primary) hypertension: Secondary | ICD-10-CM

## 2015-03-16 NOTE — Telephone Encounter (Signed)
-----   Message from Marchia Bond sent at 03/08/2015  3:20 PM EST ----- Regarding: cpx labs Fri 1/6, need orders. Thanks :-) Please order  future cpx labs for pt's upcoming lab appt. Thanks Aniceto Boss

## 2015-03-17 ENCOUNTER — Other Ambulatory Visit (INDEPENDENT_AMBULATORY_CARE_PROVIDER_SITE_OTHER): Payer: Medicare Other

## 2015-03-17 DIAGNOSIS — I10 Essential (primary) hypertension: Secondary | ICD-10-CM | POA: Diagnosis not present

## 2015-03-17 DIAGNOSIS — E538 Deficiency of other specified B group vitamins: Secondary | ICD-10-CM

## 2015-03-17 DIAGNOSIS — E559 Vitamin D deficiency, unspecified: Secondary | ICD-10-CM

## 2015-03-17 LAB — COMPREHENSIVE METABOLIC PANEL
ALT: 10 U/L (ref 0–35)
AST: 14 U/L (ref 0–37)
Albumin: 4.1 g/dL (ref 3.5–5.2)
Alkaline Phosphatase: 76 U/L (ref 39–117)
BUN: 13 mg/dL (ref 6–23)
CO2: 27 mEq/L (ref 19–32)
Calcium: 9.8 mg/dL (ref 8.4–10.5)
Chloride: 104 mEq/L (ref 96–112)
Creatinine, Ser: 0.94 mg/dL (ref 0.40–1.20)
GFR: 60.72 mL/min (ref >60.00)
Glucose, Bld: 89 mg/dL (ref 70–99)
Potassium: 4.2 mEq/L (ref 3.5–5.1)
Sodium: 139 mEq/L (ref 135–145)
Total Bilirubin: 0.3 mg/dL (ref 0.2–1.2)
Total Protein: 7.7 g/dL (ref 6.0–8.3)

## 2015-03-17 LAB — LIPID PANEL
Cholesterol: 200 mg/dL (ref 0–200)
HDL: 51.5 mg/dL (ref >39.00)
LDL Cholesterol: 115 mg/dL — ABNORMAL HIGH (ref 0–99)
NonHDL: 148.74
Total CHOL/HDL Ratio: 4
Triglycerides: 167 mg/dL — ABNORMAL HIGH (ref 0.0–149.0)
VLDL: 33.4 mg/dL (ref 0.0–40.0)

## 2015-03-17 LAB — CBC WITH DIFFERENTIAL/PLATELET
Basophils Absolute: 0 10*3/uL (ref 0.0–0.1)
Basophils Relative: 0.6 % (ref 0.0–3.0)
Eosinophils Absolute: 0.2 10*3/uL (ref 0.0–0.7)
Eosinophils Relative: 2.2 % (ref 0.0–5.0)
HCT: 33.4 % — ABNORMAL LOW (ref 36.0–46.0)
Hemoglobin: 10.8 g/dL — ABNORMAL LOW (ref 12.0–15.0)
Lymphocytes Relative: 43.2 % (ref 12.0–46.0)
Lymphs Abs: 2.9 10*3/uL (ref 0.7–4.0)
MCHC: 32.3 g/dL (ref 30.0–36.0)
MCV: 81.9 fl (ref 78.0–100.0)
Monocytes Absolute: 0.6 10*3/uL (ref 0.1–1.0)
Monocytes Relative: 9 % (ref 3.0–12.0)
Neutro Abs: 3 10*3/uL (ref 1.4–7.7)
Neutrophils Relative %: 45 % (ref 43.0–77.0)
Platelets: 261 10*3/uL (ref 150.0–400.0)
RBC: 4.08 Mil/uL (ref 3.87–5.11)
RDW: 16.9 % — ABNORMAL HIGH (ref 11.5–15.5)
WBC: 6.7 10*3/uL (ref 4.0–10.5)

## 2015-03-17 LAB — TSH: TSH: 2.98 u[IU]/mL (ref 0.35–4.50)

## 2015-03-17 LAB — VITAMIN B12: Vitamin B-12: 361 pg/mL (ref 211–911)

## 2015-03-17 LAB — VITAMIN D 25 HYDROXY (VIT D DEFICIENCY, FRACTURES): VITD: 44.02 ng/mL (ref 30.00–100.00)

## 2015-03-20 ENCOUNTER — Encounter: Payer: Medicare Other | Admitting: Family Medicine

## 2015-03-27 ENCOUNTER — Ambulatory Visit (INDEPENDENT_AMBULATORY_CARE_PROVIDER_SITE_OTHER): Payer: Medicare Other | Admitting: Family Medicine

## 2015-03-27 ENCOUNTER — Encounter: Payer: Self-pay | Admitting: Family Medicine

## 2015-03-27 VITALS — BP 132/70 | HR 84 | Temp 98.1°F | Ht 61.5 in | Wt 134.8 lb

## 2015-03-27 DIAGNOSIS — Z0000 Encounter for general adult medical examination without abnormal findings: Secondary | ICD-10-CM

## 2015-03-27 DIAGNOSIS — M810 Age-related osteoporosis without current pathological fracture: Secondary | ICD-10-CM | POA: Diagnosis not present

## 2015-03-27 DIAGNOSIS — I10 Essential (primary) hypertension: Secondary | ICD-10-CM

## 2015-03-27 DIAGNOSIS — E538 Deficiency of other specified B group vitamins: Secondary | ICD-10-CM | POA: Diagnosis not present

## 2015-03-27 DIAGNOSIS — E559 Vitamin D deficiency, unspecified: Secondary | ICD-10-CM | POA: Diagnosis not present

## 2015-03-27 DIAGNOSIS — E785 Hyperlipidemia, unspecified: Secondary | ICD-10-CM

## 2015-03-27 DIAGNOSIS — D649 Anemia, unspecified: Secondary | ICD-10-CM

## 2015-03-27 NOTE — Progress Notes (Signed)
Pre visit review using our clinic review tool, if applicable. No additional management support is needed unless otherwise documented below in the visit note. 

## 2015-03-27 NOTE — Assessment & Plan Note (Signed)
Declines further eval or tx  Fosamax in the past  One fx several years ago   On ca and D

## 2015-03-27 NOTE — Patient Instructions (Addendum)
Please work on an advance directive (living will and power of attorney)- the blue booklet I gave you  Anemia is still present  B12 level is stable  Continue vitamin D Take care of yourself and try to eat a balanced diet (make sure to get green vegetables)

## 2015-03-27 NOTE — Assessment & Plan Note (Signed)
Hb is 10.8  Close to baseline  No symptoms  Unsure of cause -has had GI w/u Continue to monitor Has seen hematology- +inst to only return if Hb below 10

## 2015-03-27 NOTE — Assessment & Plan Note (Signed)
bp in fair control at this time  BP Readings from Last 1 Encounters:  03/27/15 132/70   No changes needed Disc lifstyle change with low sodium diet and exercise  Labs reviewed

## 2015-03-27 NOTE — Assessment & Plan Note (Signed)
Reviewed health habits including diet and exercise and skin cancer prevention Reviewed appropriate screening tests for age  Also reviewed health mt list, fam hx and immunization status , as well as social and family history   See HPI Labs reviewed Please work on an advance directive (living will and power of attorney)- the blue booklet I gave you  Anemia is still present  B12 level is stable  Continue vitamin D Take care of yourself and try to eat a balanced diet (make sure to get green vegetables)

## 2015-03-27 NOTE — Assessment & Plan Note (Signed)
Disc goals for lipids and reasons to control them Rev labs with pt Rev low sat fat diet in detail  Improved-commended  

## 2015-03-27 NOTE — Progress Notes (Signed)
Subjective:    Patient ID: Jenna Young, female    DOB: October 27, 1931, 82 y.o.   MRN: [id number]  HPI Here for annual medicare wellness visit as well as chronic/acute medical problems    I have personally reviewed the Medicare Annual Wellness questionnaire and have noted 1. The patient's medical and social history 2. Their use of alcohol, tobacco or illicit drugs 3. Their current medications and supplements 4. The patient's functional ability including ADL's, fall risks, home safety risks and hearing or visual             impairment. 5. Diet and physical activities 6. Evidence for depression or mood disorders  The patients weight, height, BMI have been recorded in the chart and visual acuity is per eye clinic.  I have made referrals, counseling and provided education to the patient based review of the above and I have provided the pt with a written personalized care plan for preventive services. Reviewed and updated provider list, see scanned forms.  Is doing well overall  Wt is stable   See scanned forms.  Routine anticipatory guidance given to patient.  See health maintenance. Colon cancer screening -scope was 12/14 - had polyp/adenoma - no blood in stool Breast cancer screening mm 10/16 normal (dense)  Self breast exam-no lumps Flu vaccine 10/16  Tetanus vaccine 11/09 Pneumovax- had both of them  Zoster vaccine-declines  dexa - has hx of OP - declines further tx  No falls or fx recently (broke leg in remote past), takes her ca and D , D level is ok at 44  Is on PPI Advance directive does not have a living will and POA , given materials today  Cognitive function addressed- see scanned forms- and if abnormal then additional documentation follows. Memory is fine-no problems   PMH and SH reviewed  Meds, vitals, and allergies reviewed.   ROS: See HPI.  Otherwise negative.    B12 def On shots Lab Results  Component Value Date   VITAMINB12 361 03/17/2015   This is stable     bp is up on first check today today  No cp or palpitations or headaches or edema  No side effects to medicines  BP Readings from Last 3 Encounters:  03/27/15 140/58  11/08/14 134/60  08/17/14 120/60    BP: 132/70 mmHg -better on re check    Anemia  Lab Results  Component Value Date   WBC 6.7 03/17/2015   HGB 10.8* 03/17/2015   HCT 33.4* 03/17/2015   MCV 81.9 03/17/2015   PLT 261.0 03/17/2015   Hb was 11.2 Has not been tired/she exercises with no problem  No blood in urine or vaginal  Thinks she has always been anemic  Tries to eat a balanced diet -no red meat/eats chicken/ some hamburger , not enough greens  The hematologist she saw wants her to contact them if it gets in the single digits (HB)  Cholesterol Lab Results  Component Value Date   CHOL 200 03/17/2015   CHOL 221* 02/21/2014   CHOL 207* 01/27/2013   Lab Results  Component Value Date   HDL 51.50 03/17/2015   HDL 48.60 02/21/2014   HDL 59.80 01/27/2013   Lab Results  Component Value Date   LDLCALC 115* 03/17/2015   LDLCALC 137* 02/21/2014   LDLCALC 119* 01/10/2012   Lab Results  Component Value Date   TRIG 167.0* 03/17/2015   TRIG 179.0* 02/21/2014   TRIG 123.0 01/27/2013   Lab Results  Component Value Date   CHOLHDL 4 03/17/2015   CHOLHDL 5 02/21/2014   CHOLHDL 3 01/27/2013   Lab Results  Component Value Date   LDLDIRECT 116.6 01/27/2013   LDLDIRECT 114.5 12/04/2010   LDLDIRECT 117.1 04/23/2010    Overall improved! - she is exercising and eating well    Patient Active Problem List   Diagnosis Date Noted  . Lipoma of face 11/08/2014  . Abnormal urinalysis 09/20/2013  . Back contusion 09/20/2013  . Dysthymia 09/20/2013  . Encounter for Medicare annual wellness exam 02/03/2013  . Vaso-vagal reaction 09/25/2012  . BPPV (benign paroxysmal positional vertigo) 06/15/2012  . Chronic right ear pain 03/16/2012  . Colon cancer screening 01/17/2012  . Anemia 01/17/2012  . Vertigo  11/25/2011  . GRIEF REACTION 08/01/2009  . Vitamin D deficiency 08/19/2008  . ANXIETY 01/29/2008  . Essential hypertension, benign 09/25/2007  . B12 deficiency 09/24/2006  . Hyperlipidemia 09/24/2006  . VENOUS INSUFFICIENCY 09/24/2006  . ALLERGIC RHINITIS 09/24/2006  . GERD 09/24/2006  . Osteoporosis 09/24/2006   Past Medical History  Diagnosis Date  . GERD (gastroesophageal reflux disease)   . Allergic rhinitis   . Hyperlipidemia   . Osteopenia   . Osteoporosis   . Anxiety   . Hypertension   . Anemia    Past Surgical History  Procedure Laterality Date  . Appendectomy    . Cesarean section    . Cholecystectomy    . Ovarian cyst surgery    . Fracture surgery  07/2008    neck  . Breast surgery  10/10    benign biopsy  negative   Social History  Substance Use Topics  . Smoking status: Former Smoker    Quit date: 03/11/1968  . Smokeless tobacco: Never Used  . Alcohol Use: No   Family History  Problem Relation Age of Onset  . Hypertension Mother   . Heart disease Father   . Macular degeneration Sister   . Diabetes Maternal Aunt   . Cancer Maternal Aunt     throat   Allergies  Allergen Reactions  . Buspirone Hcl     REACTION: HEART PALPITATIONS  . Codeine     REACTION: nausea and vomiting  . Hydrochlorothiazide     REACTION: syncope-possibly from dehydration  . Lansoprazole     REACTION: abd pain  . Minocycline Hcl   . Penicillins     REACTION: mouth numbness  . Ramipril     Cough   . Sulfa Antibiotics Nausea Only   Current Outpatient Prescriptions on File Prior to Visit  Medication Sig Dispense Refill  . amLODipine (NORVASC) 10 MG tablet TAKE 1 TABLET BY MOUTH EVERY DAY 30 tablet 2  . aspirin (ASPIRIN EC) 81 MG EC tablet Take 81 mg by mouth daily. Swallow whole.    . calcium-vitamin D (OSCAL WITH D) 500-200 MG-UNIT per tablet Take 2 tablets by mouth daily.      . cetirizine (ZYRTEC) 10 MG tablet Take 10 mg by mouth as needed.     . Cholecalciferol  (VITAMIN D) 2000 UNITS CAPS Take 1 capsule by mouth daily.      . DimenhyDRINATE (DRAMAMINE) 50 MG CHEW Chew 1 tablet by mouth as needed.      Marland Kitchen losartan (COZAAR) 100 MG tablet TAKE 1 TABLET BY MOUTH EVERY DAY 30 tablet 2  . meclizine (ANTIVERT) 25 MG tablet Take 1 tablet (25 mg total) by mouth 3 (three) times daily as needed for dizziness. 30 tablet 3  .  omeprazole (PRILOSEC) 20 MG capsule TAKE 1 CAPSULE BY MOUTH EVERY DAY 30 capsule 11  . fluticasone (FLONASE) 50 MCG/ACT nasal spray Place 2 sprays into both nostrils daily. (Patient not taking: Reported on 11/08/2014) 16 g 11   No current facility-administered medications on file prior to visit.    Review of Systems Review of Systems  Constitutional: Negative for fever, appetite change, fatigue and unexpected weight change.  Eyes: Negative for pain and visual disturbance.  Respiratory: Negative for cough and shortness of breath.   Cardiovascular: Negative for cp or palpitations    Gastrointestinal: Negative for nausea, diarrhea and constipation.  Genitourinary: Negative for urgency and frequency.  Skin: Negative for pallor or rash   Neurological: Negative for weakness, light-headedness, numbness and headaches.  Hematological: Negative for adenopathy. Does not bruise/bleed easily.  Psychiatric/Behavioral: Negative for dysphoric mood. The patient is not nervous/anxious.         Objective:   Physical Exam  Constitutional: She appears well-developed and well-nourished. No distress.  Well appearing   HENT:  Head: Normocephalic and atraumatic.  Right Ear: External ear normal.  Left Ear: External ear normal.  Mouth/Throat: Oropharynx is clear and moist.  Eyes: Conjunctivae and EOM are normal. Pupils are equal, round, and reactive to light. No scleral icterus.  Neck: Normal range of motion. Neck supple. No JVD present. Carotid bruit is not present. No thyromegaly present.  Cardiovascular: Normal rate, regular rhythm, normal heart sounds  and intact distal pulses.  Exam reveals no gallop.   Varicose veins bilat legs of different sizes   Pulmonary/Chest: Effort normal and breath sounds normal. No respiratory distress. She has no wheezes. She exhibits no tenderness.  Abdominal: Soft. Bowel sounds are normal. She exhibits no distension, no abdominal bruit and no mass. There is no tenderness.  Genitourinary: No breast swelling, tenderness, discharge or bleeding.  Breast exam: No mass, nodules, thickening, tenderness, bulging, retraction, inflamation, nipple discharge or skin changes noted.  No axillary or clavicular LA.      Musculoskeletal: Normal range of motion. She exhibits no edema or tenderness.  Lymphadenopathy:    She has no cervical adenopathy.  Neurological: She is alert. She has normal reflexes. No cranial nerve deficit. She exhibits normal muscle tone. Coordination normal.  Skin: Skin is warm and dry. No rash noted. No erythema. No pallor.  Psychiatric: She has a normal mood and affect.          Assessment & Plan:   Problem List Items Addressed This Visit      Cardiovascular and Mediastinum   Essential hypertension, benign - Primary    bp in fair control at this time  BP Readings from Last 1 Encounters:  03/27/15 132/70   No changes needed Disc lifstyle change with low sodium diet and exercise  Labs reviewed         Digestive   B12 deficiency    Lab Results  Component Value Date   VITAMINB12 361 03/17/2015   Stable with b12 shots         Musculoskeletal and Integument   Osteoporosis    Declines further eval or tx  Fosamax in the past  One fx several years ago   On ca and D        Other   Anemia    Hb is 10.8  Close to baseline  No symptoms  Unsure of cause -has had GI w/u Continue to monitor Has seen hematology- +inst to only return if Hb below 10  Encounter for Medicare annual wellness exam    Reviewed health habits including diet and exercise and skin cancer  prevention Reviewed appropriate screening tests for age  Also reviewed health mt list, fam hx and immunization status , as well as social and family history   See HPI Labs reviewed Please work on an advance directive (living will and power of attorney)- the blue booklet I gave you  Anemia is still present  B12 level is stable  Continue vitamin D Take care of yourself and try to eat a balanced diet (make sure to get green vegetables)       Hyperlipidemia    Disc goals for lipids and reasons to control them Rev labs with pt Rev low sat fat diet in detail Improved- commended       Vitamin D deficiency    Vitamin D level is therapeutic with current supplementation Disc importance of this to bone and overall health

## 2015-03-27 NOTE — Assessment & Plan Note (Signed)
Vitamin D level is therapeutic with current supplementation Disc importance of this to bone and overall health  

## 2015-03-27 NOTE — Assessment & Plan Note (Signed)
Lab Results  Component Value Date   VITAMINB12 361 03/17/2015   Stable with b12 shots

## 2015-05-03 ENCOUNTER — Ambulatory Visit (INDEPENDENT_AMBULATORY_CARE_PROVIDER_SITE_OTHER)
Admission: RE | Admit: 2015-05-03 | Discharge: 2015-05-03 | Disposition: A | Discharge status: Home / Self Care | Payer: Medicare Other | Source: Ambulatory Visit | Attending: Family Medicine | Admitting: Family Medicine

## 2015-05-03 ENCOUNTER — Ambulatory Visit (INDEPENDENT_AMBULATORY_CARE_PROVIDER_SITE_OTHER): Payer: Medicare Other | Admitting: Family Medicine

## 2015-05-03 ENCOUNTER — Encounter: Payer: Self-pay | Admitting: Family Medicine

## 2015-05-03 VITALS — BP 120/60 | HR 82 | Temp 98.3°F | Wt 139.0 lb

## 2015-05-03 DIAGNOSIS — M545 Low back pain, unspecified: Secondary | ICD-10-CM

## 2015-05-03 DIAGNOSIS — S3992XA Unspecified injury of lower back, initial encounter: Secondary | ICD-10-CM | POA: Diagnosis not present

## 2015-05-03 MED ORDER — MELOXICAM 15 MG PO TABS: 15.0000 mg | ORAL_TABLET | Freq: Every day | ORAL | Status: DC | PRN

## 2015-05-03 NOTE — Progress Notes (Signed)
Pre visit review using our clinic review tool, if applicable. No additional management support is needed unless otherwise documented below in the visit note. 

## 2015-05-03 NOTE — Patient Instructions (Signed)
Try the meloxicam 15 mg one pill as needed daily with food -this is for pain and inflammation Back off the aspirin while you are on this  Use a cold compress on your back  Xray now - we will contact you in the am after results come in  I think you strained your back but I want to make sure nothing else is going on    Update me if pain worsens at any time

## 2015-05-03 NOTE — Progress Notes (Signed)
Subjective:    Patient ID: Jenna Young, female    DOB: September 19, 1931, 82 y.o.   MRN: [id number]  HPI Here with back pain  Started after she exercised - was lifting 5 lb weight (she picked up a 15 lb wt and it was too much- she lifted it in front of her ) Back started to hurt on the way back from exercise  Very sore -to the touch and a dull ache  Upper lumbar area (she points to)  Worse with movement  Flex and extend -hurts both No pain with twist   Heat made it worse Did not try ice  Took aspirin Tried asper cream    Hx of L1 fx (comp) in the past - in 2015- (with 30-40% ht loss ) after trauma  No radiation  No weakness or numbness in legs or feet   Walking does not bother her much   Patient Active Problem List   Diagnosis Date Noted  . Lumbar pain 05/03/2015  . Lipoma of face 11/08/2014  . Abnormal urinalysis 09/20/2013  . Back contusion 09/20/2013  . Dysthymia 09/20/2013  . Encounter for Medicare annual wellness exam 02/03/2013  . Vaso-vagal reaction 09/25/2012  . BPPV (benign paroxysmal positional vertigo) 06/15/2012  . Chronic right ear pain 03/16/2012  . Colon cancer screening 01/17/2012  . Anemia 01/17/2012  . Vertigo 11/25/2011  . GRIEF REACTION 08/01/2009  . Vitamin D deficiency 08/19/2008  . ANXIETY 01/29/2008  . Essential hypertension, benign 09/25/2007  . B12 deficiency 09/24/2006  . Hyperlipidemia 09/24/2006  . VENOUS INSUFFICIENCY 09/24/2006  . ALLERGIC RHINITIS 09/24/2006  . GERD 09/24/2006  . Osteoporosis 09/24/2006   Past Medical History  Diagnosis Date  . GERD (gastroesophageal reflux disease)   . Allergic rhinitis   . Hyperlipidemia   . Osteopenia   . Osteoporosis   . Anxiety   . Hypertension   . Anemia    Past Surgical History  Procedure Laterality Date  . Appendectomy    . Cesarean section    . Cholecystectomy    . Ovarian cyst surgery    . Fracture surgery  07/2008    neck  . Breast surgery  10/10    benign biopsy   negative   Social History  Substance Use Topics  . Smoking status: Former Smoker    Quit date: 03/11/1968  . Smokeless tobacco: Never Used  . Alcohol Use: No   Family History  Problem Relation Age of Onset  . Hypertension Mother   . Heart disease Father   . Macular degeneration Sister   . Diabetes Maternal Aunt   . Cancer Maternal Aunt     throat   Allergies  Allergen Reactions  . Buspirone Hcl     REACTION: HEART PALPITATIONS  . Codeine     REACTION: nausea and vomiting  . Hydrochlorothiazide     REACTION: syncope-possibly from dehydration  . Lansoprazole     REACTION: abd pain  . Minocycline Hcl   . Penicillins     REACTION: mouth numbness  . Ramipril     Cough   . Sulfa Antibiotics Nausea Only   Current Outpatient Prescriptions on File Prior to Visit  Medication Sig Dispense Refill  . amLODipine (NORVASC) 10 MG tablet TAKE 1 TABLET BY MOUTH EVERY DAY 30 tablet 2  . aspirin (ASPIRIN EC) 81 MG EC tablet Take 81 mg by mouth daily. Swallow whole.    . calcium-vitamin D (OSCAL WITH D) 500-200 MG-UNIT per tablet Take  2 tablets by mouth daily.      . cetirizine (ZYRTEC) 10 MG tablet Take 10 mg by mouth as needed.     . Cholecalciferol (VITAMIN D) 2000 UNITS CAPS Take 1 capsule by mouth daily.      . DimenhyDRINATE (DRAMAMINE) 50 MG CHEW Chew 1 tablet by mouth as needed.      Marland Kitchen losartan (COZAAR) 100 MG tablet TAKE 1 TABLET BY MOUTH EVERY DAY 30 tablet 2  . meclizine (ANTIVERT) 25 MG tablet Take 1 tablet (25 mg total) by mouth 3 (three) times daily as needed for dizziness. 30 tablet 3  . omeprazole (PRILOSEC) 20 MG capsule TAKE 1 CAPSULE BY MOUTH EVERY DAY 30 capsule 11  . fluticasone (FLONASE) 50 MCG/ACT nasal spray Place 2 sprays into both nostrils daily. (Patient not taking: Reported on 11/08/2014) 16 g 11   No current facility-administered medications on file prior to visit.     Review of Systems Review of Systems  Constitutional: Negative for fever, appetite  change, fatigue and unexpected weight change.  Eyes: Negative for pain and visual disturbance.  Respiratory: Negative for cough and shortness of breath.   Cardiovascular: Negative for cp or palpitations    Gastrointestinal: Negative for nausea, diarrhea and constipation.  Genitourinary: Negative for urgency and frequency.  Skin: Negative for pallor or rash   MSK pos for back pain w/o radicular symptoms  Neurological: Negative for weakness, light-headedness, numbness and headaches.  Hematological: Negative for adenopathy. Does not bruise/bleed easily.  Psychiatric/Behavioral: Negative for dysphoric mood. The patient is not nervous/anxious.         Objective:   Physical Exam  Constitutional: She appears well-developed and well-nourished. No distress.  Well appearing elderly female  HENT:  Head: Normocephalic and atraumatic.  Eyes: Conjunctivae and EOM are normal. Pupils are equal, round, and reactive to light. No scleral icterus.  Neck: Normal range of motion. Neck supple.  Cardiovascular: Normal rate and regular rhythm.   Pulmonary/Chest: Effort normal and breath sounds normal. She has no wheezes. She has no rales.  Abdominal: Soft. Bowel sounds are normal. She exhibits no distension. There is no tenderness.  Musculoskeletal: She exhibits tenderness.       Lumbar back: She exhibits decreased range of motion, tenderness and bony tenderness. She exhibits no edema and no spasm.  Mild tenderness in upper lumbar spines  No lateral tenderness or swelling  Flex 30 deg Ext 5-10 deg Nl twist and lateral flex  Nl slr  Nl rom hips   Walking is slow and steady  Lymphadenopathy:    She has no cervical adenopathy.  Neurological: She is alert. She has normal strength and normal reflexes. She displays no atrophy. No cranial nerve deficit or sensory deficit. She exhibits normal muscle tone. Coordination normal.  Negative SLR  Skin: Skin is warm and dry. No rash noted. No erythema. No pallor.    Psychiatric: She has a normal mood and affect.          Assessment & Plan:   Problem List Items Addressed This Visit      Other   Lumbar pain - Primary    After lifting a dumbell She has hx of L1 comp fx and OP  LS xray today  meloxicam 15 mg daily with food prn  Cold compress Slow walking   Update with plan       Relevant Medications   meloxicam (MOBIC) 15 MG tablet   Other Relevant Orders   DG Lumbar Spine Complete (  Completed)

## 2015-05-04 NOTE — Assessment & Plan Note (Signed)
After lifting a dumbell She has hx of L1 comp fx and OP  LS xray today  meloxicam 15 mg daily with food prn  Cold compress Slow walking   Update with plan

## 2015-05-05 ENCOUNTER — Telehealth: Payer: Self-pay | Admitting: Family Medicine

## 2015-05-05 DIAGNOSIS — IMO0001 Reserved for inherently not codable concepts without codable children: Secondary | ICD-10-CM

## 2015-05-05 DIAGNOSIS — M545 Low back pain, unspecified: Secondary | ICD-10-CM

## 2015-05-05 DIAGNOSIS — M4850XS Collapsed vertebra, not elsewhere classified, site unspecified, sequela of fracture: Secondary | ICD-10-CM

## 2015-05-05 NOTE — Telephone Encounter (Signed)
Patient called to find out the results of her x-ray.  Patient said she can't get on my chart.  I'm going to deactivate her my chart. Patient asked what an orthopaedic could do for her since she won't do surgery.  Please call patient and let her know.

## 2015-05-05 NOTE — Telephone Encounter (Signed)
There may be some non surgical proceedures they can offer without surgery, different imaging that can give them a better idea re; what is causing the pain / and other therapies like physical therapy  I want a more expert opinion to help [place] out  If she is interested-let me know

## 2015-05-05 NOTE — Telephone Encounter (Signed)
Pt notified of Dr. Marliss Coots comments and verbalized understanding, pt agrees with Ortho referral, she needs an appt in [place], pt is willing to see ortho she just wanted to make it clear that she isn't having any surgery to fix back issue, I advise pt our [facility] will call to schedule appt

## 2015-05-06 ENCOUNTER — Emergency Department: Payer: Medicare Other

## 2015-05-06 ENCOUNTER — Encounter: Payer: Self-pay | Admitting: Emergency Medicine

## 2015-05-06 ENCOUNTER — Emergency Department
Admission: EM | Admit: 2015-05-06 | Discharge: 2015-05-06 | Disposition: A | Discharge status: Home / Self Care | Payer: Medicare Other | Attending: Emergency Medicine | Admitting: Emergency Medicine

## 2015-05-06 DIAGNOSIS — M545 Low back pain: Secondary | ICD-10-CM | POA: Diagnosis not present

## 2015-05-06 DIAGNOSIS — K5901 Slow transit constipation: Secondary | ICD-10-CM | POA: Insufficient documentation

## 2015-05-06 DIAGNOSIS — K5900 Constipation, unspecified: Secondary | ICD-10-CM | POA: Diagnosis not present

## 2015-05-06 DIAGNOSIS — Z79899 Other long term (current) drug therapy: Secondary | ICD-10-CM | POA: Insufficient documentation

## 2015-05-06 DIAGNOSIS — I10 Essential (primary) hypertension: Secondary | ICD-10-CM | POA: Diagnosis not present

## 2015-05-06 DIAGNOSIS — Z880 Allergy status to penicillin: Secondary | ICD-10-CM | POA: Diagnosis not present

## 2015-05-06 DIAGNOSIS — Z7982 Long term (current) use of aspirin: Secondary | ICD-10-CM | POA: Insufficient documentation

## 2015-05-06 DIAGNOSIS — R109 Unspecified abdominal pain: Secondary | ICD-10-CM | POA: Diagnosis present

## 2015-05-06 DIAGNOSIS — Z87891 Personal history of nicotine dependence: Secondary | ICD-10-CM | POA: Diagnosis not present

## 2015-05-06 LAB — COMPREHENSIVE METABOLIC PANEL
ALT: 13 U/L — ABNORMAL LOW (ref 14–54)
AST: 18 U/L (ref 15–41)
Albumin: 4.1 g/dL (ref 3.5–5.0)
Alkaline Phosphatase: 93 U/L (ref 38–126)
Anion gap: 8 (ref 5–15)
BUN: 18 mg/dL (ref 6–20)
CO2: 22 mmol/L (ref 22–32)
Calcium: 9.1 mg/dL (ref 8.9–10.3)
Chloride: 107 mmol/L (ref 101–111)
Creatinine, Ser: 1.04 mg/dL — ABNORMAL HIGH (ref 0.44–1.00)
GFR calc Af Amer: 57 mL/min — ABNORMAL LOW (ref >60)
GFR calc non Af Amer: 49 mL/min — ABNORMAL LOW (ref >60)
Glucose, Bld: 106 mg/dL — ABNORMAL HIGH (ref 65–99)
Potassium: 4.5 mmol/L (ref 3.5–5.1)
Sodium: 137 mmol/L (ref 135–145)
Total Bilirubin: 0.9 mg/dL (ref 0.3–1.2)
Total Protein: 8.2 g/dL — ABNORMAL HIGH (ref 6.5–8.1)

## 2015-05-06 LAB — CBC
HCT: 32.9 % — ABNORMAL LOW (ref 35.0–47.0)
Hemoglobin: 11 g/dL — ABNORMAL LOW (ref 12.0–16.0)
MCH: 26.6 pg (ref 26.0–34.0)
MCHC: 33.6 g/dL (ref 32.0–36.0)
MCV: 79.1 fL — ABNORMAL LOW (ref 80.0–100.0)
Platelets: 264 10*3/uL (ref 150–440)
RBC: 4.16 MIL/uL (ref 3.80–5.20)
RDW: 16.4 % — ABNORMAL HIGH (ref 11.5–14.5)
WBC: 8.6 10*3/uL (ref 3.6–11.0)

## 2015-05-06 LAB — LIPASE, BLOOD: Lipase: 22 U/L (ref 11–51)

## 2015-05-06 LAB — TROPONIN I: Troponin I: 0.03 ng/mL (ref <0.031)

## 2015-05-06 MED ORDER — IOHEXOL 240 MG/ML SOLN
25.0000 mL | Freq: Once | INTRAMUSCULAR | Status: AC | PRN
  Administered 2015-05-06: 25 mL via ORAL

## 2015-05-06 MED ORDER — ONDANSETRON HCL 4 MG/2ML IJ SOLN
4.0000 mg | Freq: Once | INTRAMUSCULAR | Status: AC
  Administered 2015-05-06: 4 mg via INTRAVENOUS
  Filled 2015-05-06: qty 2

## 2015-05-06 MED ORDER — SODIUM CHLORIDE 0.9 % IV BOLUS (SEPSIS)
1000.0000 mL | Freq: Once | INTRAVENOUS | Status: AC
  Administered 2015-05-06: 1000 mL via INTRAVENOUS

## 2015-05-06 MED ORDER — MAGNESIUM CITRATE PO SOLN
1.0000 | Freq: Once | ORAL | Status: AC
  Administered 2015-05-06: 1 via ORAL
  Filled 2015-05-06: qty 296

## 2015-05-06 MED ORDER — IOHEXOL 300 MG/ML  SOLN
75.0000 mL | Freq: Once | INTRAMUSCULAR | Status: AC | PRN
  Administered 2015-05-06: 75 mL via INTRAVENOUS

## 2015-05-06 NOTE — ED Notes (Signed)
Reports no BM for several days.  NAD

## 2015-05-06 NOTE — ED Provider Notes (Signed)
[facility] Emergency Department Provider Note  ____________________________________________  Time seen: Approximately 2:24 PM  I have reviewed the triage vital signs and the nursing notes.   HISTORY  Chief Complaint Constipation  HPI Jenna Young is a 82 y.o. female presents for evaluation of abdominal pain. Patient began experiencing abdominal pain today, but tells me that she is not failed a bowel movement since Monday. She feels slightly bloated and nauseated. Denies any dark or bloody stool, she has not vomited. She does relate that on Monday she suffered a compression fracture which is currently under the care of her doctor, but the pain in her back does not seem be related to crampy abdominal pain she's been into today.  She tried to have a large bowel movement, but was unable and during that episode she did feel weak and felt sweaty for a couple seconds but this went away. Denies any chest pain or trouble breathing.  Past Medical History  Diagnosis Date  . GERD (gastroesophageal reflux disease)   . Allergic rhinitis   . Hyperlipidemia   . Osteopenia   . Osteoporosis   . Anxiety   . Hypertension   . Anemia     Patient Active Problem List   Diagnosis Date Noted  . Lumbar pain 05/03/2015  . Lipoma of face 11/08/2014  . Abnormal urinalysis 09/20/2013  . Back contusion 09/20/2013  . Dysthymia 09/20/2013  . Encounter for Medicare annual wellness exam 02/03/2013  . Vaso-vagal reaction 09/25/2012  . BPPV (benign paroxysmal positional vertigo) 06/15/2012  . Chronic right ear pain 03/16/2012  . Colon cancer screening 01/17/2012  . Anemia 01/17/2012  . Vertigo 11/25/2011  . GRIEF REACTION 08/01/2009  . Vitamin D deficiency 08/19/2008  . ANXIETY 01/29/2008  . Essential hypertension, benign 09/25/2007  . B12 deficiency 09/24/2006  . Hyperlipidemia 09/24/2006  . VENOUS INSUFFICIENCY 09/24/2006  . ALLERGIC RHINITIS 09/24/2006  . GERD 09/24/2006   . Osteoporosis 09/24/2006    Past Surgical History  Procedure Laterality Date  . Appendectomy    . Cesarean section    . Cholecystectomy    . Ovarian cyst surgery    . Fracture surgery  07/2008    neck  . Breast surgery  10/10    benign biopsy  negative    Current Outpatient Rx  Name  Route  Sig  Dispense  Refill  . amLODipine (NORVASC) 10 MG tablet      TAKE 1 TABLET BY MOUTH EVERY DAY   30 tablet   2   . aspirin (ASPIRIN EC) 81 MG EC tablet   Oral   Take 81 mg by mouth daily. Swallow whole.         . calcium-vitamin D (OSCAL WITH D) 500-200 MG-UNIT per tablet   Oral   Take 2 tablets by mouth daily.           . cetirizine (ZYRTEC) 10 MG tablet   Oral   Take 10 mg by mouth as needed.          . Cholecalciferol (VITAMIN D) 2000 UNITS CAPS   Oral   Take 1 capsule by mouth daily.           . DimenhyDRINATE (DRAMAMINE) 50 MG CHEW   Oral   Chew 1 tablet by mouth as needed.           Marland Kitchen EXPIRED: fluticasone (FLONASE) 50 MCG/ACT nasal spray   Each Nare   Place 2 sprays into both nostrils daily. Patient  not taking: Reported on 11/08/2014   16 g   11   . losartan (COZAAR) 100 MG tablet      TAKE 1 TABLET BY MOUTH EVERY DAY   30 tablet   2   . meclizine (ANTIVERT) 25 MG tablet   Oral   Take 1 tablet (25 mg total) by mouth 3 (three) times daily as needed for dizziness.   30 tablet   3   . meloxicam (MOBIC) 15 MG tablet   Oral   Take 1 tablet (15 mg total) by mouth daily as needed for pain (back pain). Take with a meal   30 tablet   1   . omeprazole (PRILOSEC) 20 MG capsule      TAKE 1 CAPSULE BY MOUTH EVERY DAY   30 capsule   11     Allergies Buspirone hcl; Codeine; Hydrochlorothiazide; Lansoprazole; Minocycline hcl; Penicillins; Ramipril; and Sulfa antibiotics  Family History  Problem Relation Age of Onset  . Hypertension Mother   . Heart disease Father   . Macular degeneration Sister   . Diabetes Maternal Aunt   . Cancer Maternal  Aunt     throat    Social History Social History  Substance Use Topics  . Smoking status: Former Smoker    Quit date: 03/11/1968  . Smokeless tobacco: Never Used  . Alcohol Use: No    Review of Systems Constitutional: No fever/chills Eyes: No visual changes. ENT: No sore throat. Cardiovascular: Denies chest pain. Respiratory: Denies shortness of breath. Gastrointestinal: No vomiting.  No diarrhea.   Genitourinary: Negative for dysuria. Musculoskeletal: Negative for back pain. Skin: Negative for rash. Neurological: Negative for headaches, focal weakness or numbness.  10-point ROS otherwise negative.  ____________________________________________   PHYSICAL EXAM:  VITAL SIGNS: ED Triage Vitals  Enc Vitals Group     BP 05/06/15 1250 140/59 mmHg     Pulse Rate 05/06/15 1250 89     Resp 05/06/15 1250 18     Temp 05/06/15 1250 97.9 F (36.6 C)     Temp Source 05/06/15 1250 Oral     SpO2 05/06/15 1250 96 %     Weight 05/06/15 1250 130 lb (58.968 kg)     Height 05/06/15 1250 5\' 4"  (1.626 m)     Head Cir --      Peak Flow --      Pain Score 05/06/15 1244 5     Pain Loc --      Pain Edu? --      Excl. in [place]? --    Constitutional: Alert and oriented. Well appearing and in no acute distress. Eyes: Conjunctivae are normal. PERRL. EOMI. Head: Atraumatic. Nose: No congestion/rhinnorhea. Mouth/Throat: Mucous membranes are moist.  Oropharynx non-erythematous. Neck: No stridor.   Cardiovascular: Normal rate, regular rhythm. Grossly normal heart sounds.  Good peripheral circulation. Respiratory: Normal respiratory effort.  No retractions. Lungs CTAB. Gastrointestinal: Soft but moderate nonfocal tenderness without frank rebound or guarding. No distention. No abdominal bruits. No CVA tenderness. Musculoskeletal: Mild tenderness to the mid lumbar spine without step-off. Normal distal motor sensory exam and strong dorsalis pedis pulses bilateral. 5 out of 5 strength in the legs  bilaterally. No lower extremity tenderness nor edema.  No joint effusions. Rectal exam demonstrates normal rectal tone, normal sensation, and vault empty. Heme-negative slight amount of brown stool. Neurologic:  Normal speech and language. No gross focal neurologic deficits are appreciated.able to stand and walk without difficulty Skin:  Skin is warm, dry and  intact. No rash noted. Psychiatric: Mood and affect are normal. Speech and behavior are normal.  ____________________________________________   LABS (all labs ordered are listed, but only abnormal results are displayed)  Labs Reviewed  CBC - Abnormal; Notable for the following:    Hemoglobin 11.0 (*)    HCT 32.9 (*)    MCV 79.1 (*)    RDW 16.4 (*)    All other components within normal limits  COMPREHENSIVE METABOLIC PANEL - Abnormal; Notable for the following:    Glucose, Bld 106 (*)    Creatinine, Ser 1.04 (*)    Total Protein 8.2 (*)    ALT 13 (*)    GFR calc non Af Amer 49 (*)    GFR calc Af Amer 57 (*)    All other components within normal limits  LIPASE, BLOOD  TROPONIN I   ____________________________________________  EKG  Reviewed and her by me at 1410 Normal sinus rhythm Heart rate 90 QRS 1:15 QTc 450 Probable left ventricular hypertrophy, some T-wave inversion noted in V6 and no concerning for acute ischemic abnormality is noted. ____________________________________________  RADIOLOGY  CT Abdomen Pelvis W Contrast (Final result) Result time: 05/06/15 15:48:28   Final result by Rad Results In Interface (05/06/15 15:48:28)   Narrative:   CLINICAL DATA: 82 year old female with abdominal pain, nausea and constipation for 1 week.  EXAM: CT ABDOMEN AND PELVIS WITH CONTRAST  TECHNIQUE: Multidetector CT imaging of the abdomen and pelvis was performed using the standard protocol following bolus administration of intravenous contrast.  CONTRAST: 41mL OMNIPAQUE IOHEXOL 300 MG/ML SOLN  COMPARISON:  05/03/2015 and prior radiographs  FINDINGS: Lower chest: Cardiomegaly without acute abnormality.  Hepatobiliary: The liver is unremarkable. The patient is status post cholecystectomy. There is no evidence of biliary dilatation.  Pancreas: Unremarkable  Spleen: Unremarkable  Adrenals/Urinary Tract: The kidneys, adrenal glands and bladder are unremarkable.  Stomach/Bowel: Moderate stool in the ascending and transverse colon noted. There is no evidence of bowel aortic atherosclerosis without aneurysm.  Obstruction or definite bowel wall thickening.  Vascular/Lymphatic: No enlarged lymph nodes. Aortic atherosclerotic calcifications noted without aneurysm.  Reproductive: Unremarkable  Other: No free fluid, abscess or pneumoperitoneum.  Musculoskeletal: No acute or suspicious abnormalities identified. 75% L1 compression fracture and mild compression of the T12 superior endplate again noted.  IMPRESSION: No evidence of acute abnormality.  Moderate stool within the ascending and transverse colon without transition point or evidence of bowel obstruction.  Unchanged T12 and L1 compression fractures.   Electronically Signed By: Margarette Canada M.D. On: 05/06/2015 15:48    ____________________________________________   PROCEDURES  Procedure(s) performed: None  Critical Care performed: No  ____________________________________________   INITIAL IMPRESSION / ASSESSMENT AND PLAN / ED COURSE  Pertinent labs & imaging results that were available during my care of the patient were reviewed by me and considered in my medical decision making (see chart for details).  for abdominal discomfort, feeling "constipated". She has a recent diagnosis of lumbar fracture but is currently under treatment by her primary care. Based on patient's age, we will obtain CT imaging to evaluate and rule out acute intra-abdominal pathology as  well.  ----------------------------------------- 5:20 PM on 05/06/2015 -----------------------------------------  CT scan reassuring, seems a fit the patient's clinical history of constipation. We will provide enema and oral laxatives. Thereafter anticipate likely discharge to home, close follow-up with primary care doctor. Patient is currently under treatment for compression fracture and does not have any evidence of neurovascular compromise.  ----------------------------------------- 6:49 PM on 05/06/2015 -----------------------------------------  Patient had large bowel movement. Feels much improved. Vital signs are stable. Discharge home with treatment recommendations and follow-up care/return precautions advised the patient and family. All in agreement with plan. ____________________________________________   FINAL CLINICAL IMPRESSION(S) / ED DIAGNOSES  Final diagnoses:  Slow transit constipation      Delman Kitten, MD 05/06/15 1850

## 2015-05-06 NOTE — Discharge Instructions (Signed)
You were seen in the emergency department today for constipation.  We recommend that you use one or more of the following over-the-counter medications in the order described:   1)  Colace (or Dulcolax) 100 mg:  This is a stool softener, and you may take it once or twice a day as needed. 2)  Senna tablets:  This is a bowel stimulant that will help "push" out your stool. It is the next step to add after you have tried a stool softener. 3)  Miralax (powder):  This medication works by drawing additional fluid into your intestines and helps to flush out your stool.  Mix the powder with water or juice according to label instructions.  It may help if the Colace and Senna are not sufficient, but you must be sure to use the recommended amount of water or juice when you mix up the powder. Remember that narcotic pain medications are constipating, so avoid them or minimize their use.  Drink plenty of fluids.  Please return to the Emergency Department immediately if you develop new or worsening symptoms that concern you, such as (but not limited to) fever > 101 degrees, severe abdominal pain, or persistent vomiting.   Constipation, Adult Constipation is when a person has fewer than three bowel movements a week, has difficulty having a bowel movement, or has stools that are dry, hard, or larger than normal. As people grow older, constipation is more common. A low-fiber diet, not taking in enough fluids, and taking certain medicines may make constipation worse.  CAUSES   Certain medicines, such as antidepressants, pain medicine, iron supplements, antacids, and water pills.   Certain diseases, such as diabetes, irritable bowel syndrome (IBS), thyroid disease, or depression.   Not drinking enough water.   Not eating enough fiber-rich foods.   Stress or travel.   Lack of physical activity or exercise.   Ignoring the urge to have a bowel movement.   Using laxatives too much.  SIGNS AND SYMPTOMS    Having fewer than three bowel movements a week.   Straining to have a bowel movement.   Having stools that are hard, dry, or larger than normal.   Feeling full or bloated.   Pain in the lower abdomen.   Not feeling relief after having a bowel movement.  DIAGNOSIS  Your health care provider will take a medical history and perform a physical exam. Further testing may be done for severe constipation. Some tests may include:  A barium enema X-ray to examine your rectum, colon, and, sometimes, your small intestine.   A sigmoidoscopy to examine your lower colon.   A colonoscopy to examine your entire colon. TREATMENT  Treatment will depend on the severity of your constipation and what is causing it. Some dietary treatments include drinking more fluids and eating more fiber-rich foods. Lifestyle treatments may include regular exercise. If these diet and lifestyle recommendations do not help, your health care provider may recommend taking over-the-counter laxative medicines to help you have bowel movements. Prescription medicines may be prescribed if over-the-counter medicines do not work.  HOME CARE INSTRUCTIONS   Eat foods that have a lot of fiber, such as fruits, vegetables, whole grains, and beans.  Limit foods high in fat and processed sugars, such as french fries, hamburgers, cookies, candies, and soda.   A fiber supplement may be added to your diet if you cannot get enough fiber from foods.   Drink enough fluids to keep your urine clear or pale yellow.  Exercise regularly or as directed by your health care provider.   Go to the restroom when you have the urge to go. Do not hold it.   Only take over-the-counter or prescription medicines as directed by your health care provider. Do not take other medicines for constipation without talking to your health care provider first.  [place] IF:   You have bright red blood in your stool.   Your  constipation lasts for more than 4 days or gets worse.   You have abdominal or rectal pain.   You have thin, pencil-like stools.   You have unexplained weight loss. MAKE SURE YOU:   Understand these instructions.  Will watch your condition.  Will get help right away if you are not doing well or get worse.   This information is not intended to replace advice given to you by your health care provider. Make sure you discuss any questions you have with your health care provider.   Document Released: 11/24/2003 Document Revised: 03/18/2014 Document Reviewed: 12/07/2012 Elsevier Interactive Patient Education [employer].

## 2015-05-07 DIAGNOSIS — Z8781 Personal history of (healed) traumatic fracture: Secondary | ICD-10-CM | POA: Insufficient documentation

## 2015-05-07 MED ORDER — FENTANYL CITRATE (PF) 100 MCG/2ML IJ SOLN
INTRAMUSCULAR | Status: AC
  Filled 2015-05-07: qty 2

## 2015-05-07 NOTE — Telephone Encounter (Signed)
Ref done  

## 2015-05-17 DIAGNOSIS — S32010A Wedge compression fracture of first lumbar vertebra, initial encounter for closed fracture: Secondary | ICD-10-CM | POA: Diagnosis not present

## 2015-05-30 ENCOUNTER — Other Ambulatory Visit: Payer: Self-pay | Admitting: Family Medicine

## 2015-06-08 ENCOUNTER — Ambulatory Visit (INDEPENDENT_AMBULATORY_CARE_PROVIDER_SITE_OTHER): Payer: Medicare Other | Admitting: *Deleted

## 2015-06-08 DIAGNOSIS — E538 Deficiency of other specified B group vitamins: Secondary | ICD-10-CM | POA: Diagnosis not present

## 2015-06-08 MED ORDER — CYANOCOBALAMIN 1000 MCG/ML IJ SOLN
1000.0000 ug | Freq: Once | INTRAMUSCULAR | Status: AC
  Administered 2015-06-08: 1000 ug via INTRAMUSCULAR

## 2015-07-03 DIAGNOSIS — H5213 Myopia, bilateral: Secondary | ICD-10-CM | POA: Diagnosis not present

## 2015-07-03 DIAGNOSIS — H2513 Age-related nuclear cataract, bilateral: Secondary | ICD-10-CM | POA: Diagnosis not present

## 2015-07-03 DIAGNOSIS — H52223 Regular astigmatism, bilateral: Secondary | ICD-10-CM | POA: Diagnosis not present

## 2015-07-03 DIAGNOSIS — H524 Presbyopia: Secondary | ICD-10-CM | POA: Diagnosis not present

## 2015-07-03 DIAGNOSIS — I10 Essential (primary) hypertension: Secondary | ICD-10-CM | POA: Diagnosis not present

## 2015-09-04 ENCOUNTER — Telehealth: Payer: Self-pay | Admitting: Family Medicine

## 2015-09-04 NOTE — Telephone Encounter (Signed)
Patient Name: TRENESHA SPAGNOLI  DOB: 29-Jun-1931    Initial Comment Caller states she has been bit by a Tick.   Nurse Assessment  Nurse: Leilani Merl, RN, Heather Date/Time (Eastern Time): 09/04/2015 11:23:03 AM  Confirm and document reason for call. If symptomatic, describe symptoms. You must click the next button to save text entered. ---Caller states she has been bitten by a tick on Friday, she has a knot there that is smaller than a dime  Has the patient traveled out of the country within the last 30 days? ---Not Applicable  Does the patient have any new or worsening symptoms? ---Yes  Will a triage be completed? ---Yes  Related visit to physician within the last 2 weeks? ---No  Does the PT have any chronic conditions? (i.e. diabetes, asthma, etc.) ---Yes  List chronic conditions. ---see MR  Is this a behavioral health or substance abuse call? ---No     Guidelines    Guideline Title Affirmed Question Affirmed Notes  Tick Bite Tick bite with no complications    Final Disposition [place], RN, Water quality scientist    Disagree/Comply: Comply

## 2015-09-14 ENCOUNTER — Ambulatory Visit (INDEPENDENT_AMBULATORY_CARE_PROVIDER_SITE_OTHER): Payer: Medicare Other

## 2015-09-14 DIAGNOSIS — E538 Deficiency of other specified B group vitamins: Secondary | ICD-10-CM

## 2015-09-14 MED ORDER — CYANOCOBALAMIN 1000 MCG/ML IJ SOLN
1000.0000 ug | Freq: Once | INTRAMUSCULAR | Status: AC
  Administered 2015-09-14: 1000 ug via INTRAMUSCULAR

## 2015-10-09 ENCOUNTER — Ambulatory Visit (INDEPENDENT_AMBULATORY_CARE_PROVIDER_SITE_OTHER): Payer: Medicare Other | Admitting: Family Medicine

## 2015-10-09 ENCOUNTER — Encounter: Payer: Self-pay | Admitting: Family Medicine

## 2015-10-09 VITALS — BP 130/60 | HR 84 | Temp 98.3°F | Ht 61.5 in | Wt 141.0 lb

## 2015-10-09 DIAGNOSIS — R011 Cardiac murmur, unspecified: Secondary | ICD-10-CM

## 2015-10-09 DIAGNOSIS — R5382 Chronic fatigue, unspecified: Secondary | ICD-10-CM

## 2015-10-09 DIAGNOSIS — I10 Essential (primary) hypertension: Secondary | ICD-10-CM | POA: Diagnosis not present

## 2015-10-09 DIAGNOSIS — R600 Localized edema: Secondary | ICD-10-CM

## 2015-10-09 DIAGNOSIS — I872 Venous insufficiency (chronic) (peripheral): Secondary | ICD-10-CM

## 2015-10-09 DIAGNOSIS — I8393 Asymptomatic varicose veins of bilateral lower extremities: Secondary | ICD-10-CM | POA: Diagnosis not present

## 2015-10-09 DIAGNOSIS — I8390 Asymptomatic varicose veins of unspecified lower extremity: Secondary | ICD-10-CM | POA: Insufficient documentation

## 2015-10-09 DIAGNOSIS — R5383 Other fatigue: Secondary | ICD-10-CM | POA: Insufficient documentation

## 2015-10-09 NOTE — Progress Notes (Signed)
Pre visit review using our clinic review tool, if applicable. No additional management support is needed unless otherwise documented below in the visit note. 

## 2015-10-09 NOTE — Patient Instructions (Signed)
Blood pressure is better  Start wearing support stockings whenever you are not in bed  Elevate your feet when you sit We may consider a vein specialist in the future   Stop at check out for referral for an echocardiogram  Make sure you are drinking enough water (in addition to the tea)   Labs today for fatigue and not feeling well

## 2015-10-09 NOTE — Assessment & Plan Note (Signed)
More bothersome lately with pedal edema  Disc imp of elevation of legs She will start wearing her support hose daily (consider px for them if she can tolerate) Pt is interested in a vein clinic ref in the future

## 2015-10-09 NOTE — Assessment & Plan Note (Signed)
Likely multifactorial Labs today  No cardiac s/s - but did detect a murmur on exam  2D echo ordered

## 2015-10-09 NOTE — Assessment & Plan Note (Signed)
Likely due to venous insuff and amlodipine bp is controlled Not a candidate for diuretic (prior caused syncope) Disc leg elevation supp stockings every day  Consider vein clinic ref in the future

## 2015-10-09 NOTE — Assessment & Plan Note (Signed)
New on exam today Systolic No sob or cp Does have pedal edema 2D echo ordered

## 2015-10-09 NOTE — Assessment & Plan Note (Signed)
One larger vein on R upper leg is bothersome  Pt will wear support stockings Interested in ref to vein clinic in the future

## 2015-10-09 NOTE — Assessment & Plan Note (Signed)
bp in fair control at this time  BP Readings from Last 1 Encounters:  10/09/15 130/60   No changes needed Disc lifstyle change with low sodium diet and exercise  Suspect amlodipine is causing pedal edema  Cannot start diuretic in light of rxn to hctz in the past Disc DASH diet and fluid intake

## 2015-10-09 NOTE — Progress Notes (Signed)
Subjective:    Patient ID: Jenna Young, female    DOB: 03/07/1932, 82 y.o.   MRN: [id number]  HPI Here for swelling of legs/feet Going on for a while -getting worse  She does have support stockings - does not wear them every day (just on the weekends)   Woke up this am and just felt sort of weak Went to her exercise class (did ok)  Ate a sandwhich Felt dizzy -took a bonine  No head injury   Has one bad varicose vein in R upper leg- hurts at times   No sob  No cp   BP Readings from Last 3 Encounters:  10/09/15 (!) 142/56  05/06/15 (!) 125/51  05/03/15 120/60    Of note- dehydration and syncope when she tried hctz   Wt Readings from Last 3 Encounters:  10/09/15 141 lb (64 kg)  05/06/15 130 lb (59 kg)  05/03/15 139 lb (63 kg)   bmi is 26.2    Chemistry      Component Value Date/Time   NA 137 05/06/2015 1416   NA 136 10/22/2012 1914   K 4.5 05/06/2015 1416   K 3.8 10/22/2012 1914   CL 107 05/06/2015 1416   CL 103 10/22/2012 1914   CO2 22 05/06/2015 1416   CO2 27 10/22/2012 1914   BUN 18 05/06/2015 1416   BUN 13 10/22/2012 1914   CREATININE 1.04 (H) 05/06/2015 1416   CREATININE 0.95 10/22/2012 1914      Component Value Date/Time   CALCIUM 9.1 05/06/2015 1416   CALCIUM 9.1 10/22/2012 1914   ALKPHOS 93 05/06/2015 1416   ALKPHOS 85 10/22/2012 1914   AST 18 05/06/2015 1416   AST 20 10/22/2012 1914   ALT 13 (L) 05/06/2015 1416   ALT 20 10/22/2012 1914   BILITOT 0.9 05/06/2015 1416   BILITOT 0.3 10/22/2012 1914      Patient Active Problem List   Diagnosis Date Noted  . Varicose vein of leg 10/09/2015  . Fatigue 10/09/2015  . Pedal edema 10/09/2015  . Heart murmur 10/09/2015  . Spinal compression fracture ([place]) 05/07/2015  . Lumbar pain 05/03/2015  . Lipoma of face 11/08/2014  . Abnormal urinalysis 09/20/2013  . Back contusion 09/20/2013  . Dysthymia 09/20/2013  . Encounter for Medicare annual wellness exam 02/03/2013  . Vaso-vagal reaction  09/25/2012  . BPPV (benign paroxysmal positional vertigo) 06/15/2012  . Chronic right ear pain 03/16/2012  . Colon cancer screening 01/17/2012  . Anemia 01/17/2012  . Vertigo 11/25/2011  . GRIEF REACTION 08/01/2009  . Vitamin D deficiency 08/19/2008  . ANXIETY 01/29/2008  . Essential hypertension, benign 09/25/2007  . B12 deficiency 09/24/2006  . Hyperlipidemia 09/24/2006  . Venous (peripheral) insufficiency 09/24/2006  . ALLERGIC RHINITIS 09/24/2006  . GERD 09/24/2006  . Osteoporosis 09/24/2006   Past Medical History:  Diagnosis Date  . Allergic rhinitis   . Anemia   . Anxiety   . GERD (gastroesophageal reflux disease)   . Hyperlipidemia   . Hypertension   . Osteopenia   . Osteoporosis    Past Surgical History:  Procedure Laterality Date  . APPENDECTOMY    . BREAST SURGERY  10/10   benign biopsy  negative  . CESAREAN SECTION    . CHOLECYSTECTOMY    . FRACTURE SURGERY  07/2008   neck  . OVARIAN CYST SURGERY     Social History  Substance Use Topics  . Smoking status: Former Smoker    Quit date: 03/11/1968  .  Smokeless tobacco: Never Used  . Alcohol use No   Family History  Problem Relation Age of Onset  . Hypertension Mother   . Heart disease Father   . Macular degeneration Sister   . Diabetes Maternal Aunt   . Cancer Maternal Aunt     throat   Allergies  Allergen Reactions  . Buspirone Hcl     REACTION: HEART PALPITATIONS  . Codeine     REACTION: nausea and vomiting  . Hydrochlorothiazide     REACTION: syncope-possibly from dehydration  . Lansoprazole     REACTION: abd pain  . Minocycline Hcl   . Penicillins     REACTION: mouth numbness  . Ramipril     Cough   . Sulfa Antibiotics Nausea Only   Current Outpatient Prescriptions on File Prior to Visit  Medication Sig Dispense Refill  . amLODipine (NORVASC) 10 MG tablet TAKE 1 TABLET BY MOUTH EVERY DAY 30 tablet 11  . aspirin (ASPIRIN EC) 81 MG EC tablet Take 81 mg by mouth daily. Swallow whole.     . calcium-vitamin D (OSCAL WITH D) 500-200 MG-UNIT per tablet Take 2 tablets by mouth daily.      . cetirizine (ZYRTEC) 10 MG tablet Take 10 mg by mouth as needed.     . Cholecalciferol (VITAMIN D) 2000 UNITS CAPS Take 1 capsule by mouth daily.      . DimenhyDRINATE (DRAMAMINE) 50 MG CHEW Chew 1 tablet by mouth as needed.      Marland Kitchen losartan (COZAAR) 100 MG tablet TAKE 1 TABLET BY MOUTH EVERY DAY 30 tablet 11  . meclizine (ANTIVERT) 25 MG tablet Take 1 tablet (25 mg total) by mouth 3 (three) times daily as needed for dizziness. 30 tablet 3  . meloxicam (MOBIC) 15 MG tablet Take 1 tablet (15 mg total) by mouth daily as needed for pain (back pain). Take with a meal 30 tablet 1  . omeprazole (PRILOSEC) 20 MG capsule TAKE 1 CAPSULE BY MOUTH EVERY DAY 30 capsule 11  . fluticasone (FLONASE) 50 MCG/ACT nasal spray Place 2 sprays into both nostrils daily. (Patient not taking: Reported on 11/08/2014) 16 g 11   No current facility-administered medications on file prior to visit.      Review of Systems Review of Systems  Constitutional: Negative for fever, appetite change,  and unexpected weight change. pos for generalized fatigue and malaise  Eyes: Negative for pain and visual disturbance.  Respiratory: Negative for cough and shortness of breath.   Cardiovascular: Negative for cp or palpitations   pos for varicose veins and pedal edema  Gastrointestinal: Negative for nausea, diarrhea and constipation.  Genitourinary: Negative for urgency and frequency.  Skin: Negative for pallor or rash   Neurological: Negative for weakness, light-headedness, numbness and headaches.  Hematological: Negative for adenopathy. Does not bruise/bleed easily.  Psychiatric/Behavioral: Negative for dysphoric mood. The patient is not nervous/anxious.         Objective:   Physical Exam  Constitutional: She appears well-developed and well-nourished. No distress.  Well appearing   HENT:  Head: Normocephalic and atraumatic.    Mouth/Throat: Oropharynx is clear and moist.  Eyes: Conjunctivae and EOM are normal. Pupils are equal, round, and reactive to light.  Neck: Normal range of motion. Neck supple. No JVD present. Carotid bruit is not present. No thyromegaly present.  Cardiovascular: Normal rate, regular rhythm and intact distal pulses.  Exam reveals no gallop.   Murmur heard. Systolic M heard 2/6 systolic  bilat LE varicosities  Pulmonary/Chest: Effort normal and breath sounds normal. No respiratory distress. She has no wheezes. She has no rales.  No crackles  Abdominal: Soft. Bowel sounds are normal. She exhibits no distension, no abdominal bruit and no mass. There is no tenderness.  Musculoskeletal: She exhibits edema.  Trace pedal edema-not pitting Feet and ankles are covered in small varicosities Large tortuous vein r upper leg All compressible w/o ulceration    Lymphadenopathy:    She has no cervical adenopathy.  Neurological: She is alert. She has normal reflexes. No cranial nerve deficit. She exhibits normal muscle tone. Coordination normal.  Skin: Skin is warm and dry. No rash noted. No pallor.  Psychiatric: She has a normal mood and affect.          Assessment & Plan:   Problem List Items Addressed This Visit      Cardiovascular and Mediastinum   Venous (peripheral) insufficiency    More bothersome lately with pedal edema  Disc imp of elevation of legs She will start wearing her support hose daily (consider px for them if she can tolerate) Pt is interested in a vein clinic ref in the future      Varicose vein of leg    One larger vein on R upper leg is bothersome  Pt will wear support stockings Interested in ref to vein clinic in the future      Essential hypertension, benign - Primary    bp in fair control at this time  BP Readings from Last 1 Encounters:  10/09/15 130/60   No changes needed Disc lifstyle change with low sodium diet and exercise  Suspect amlodipine is  causing pedal edema  Cannot start diuretic in light of rxn to hctz in the past Disc DASH diet and fluid intake        Other   Pedal edema    Likely due to venous insuff and amlodipine bp is controlled Not a candidate for diuretic (prior caused syncope) Disc leg elevation supp stockings every day  Consider vein clinic ref in the future      Relevant Orders   Comprehensive metabolic panel   TSH   ECHOCARDIOGRAM COMPLETE   Heart murmur   Relevant Orders   ECHOCARDIOGRAM COMPLETE   Fatigue    Likely multifactorial Labs today  No cardiac s/s - but did detect a murmur on exam  2D echo ordered       Relevant Orders   CBC with Differential/Platelet   Comprehensive metabolic panel   TSH    Other Visit Diagnoses   None.

## 2015-10-10 ENCOUNTER — Encounter: Payer: Self-pay | Admitting: *Deleted

## 2015-10-10 LAB — CBC WITH DIFFERENTIAL/PLATELET
Basophils Absolute: 0.1 10*3/uL (ref 0.0–0.1)
Basophils Relative: 1.5 % (ref 0.0–3.0)
Eosinophils Absolute: 0.1 10*3/uL (ref 0.0–0.7)
Eosinophils Relative: 1.7 % (ref 0.0–5.0)
HCT: 32.3 % — ABNORMAL LOW (ref 36.0–46.0)
Hemoglobin: 10.8 g/dL — ABNORMAL LOW (ref 12.0–15.0)
Lymphocytes Relative: 38.7 % (ref 12.0–46.0)
Lymphs Abs: 2.9 10*3/uL (ref 0.7–4.0)
MCHC: 33.3 g/dL (ref 30.0–36.0)
MCV: 79.2 fl (ref 78.0–100.0)
Monocytes Absolute: 0.5 10*3/uL (ref 0.1–1.0)
Monocytes Relative: 6.2 % (ref 3.0–12.0)
Neutro Abs: 3.9 10*3/uL (ref 1.4–7.7)
Neutrophils Relative %: 51.9 % (ref 43.0–77.0)
Platelets: 264 10*3/uL (ref 150.0–400.0)
RBC: 4.09 Mil/uL (ref 3.87–5.11)
RDW: 18 % — ABNORMAL HIGH (ref 11.5–15.5)
WBC: 7.6 10*3/uL (ref 4.0–10.5)

## 2015-10-10 LAB — COMPREHENSIVE METABOLIC PANEL
ALT: 10 U/L (ref 0–35)
AST: 15 U/L (ref 0–37)
Albumin: 4.3 g/dL (ref 3.5–5.2)
Alkaline Phosphatase: 75 U/L (ref 39–117)
BUN: 15 mg/dL (ref 6–23)
CO2: 24 mEq/L (ref 19–32)
Calcium: 9.8 mg/dL (ref 8.4–10.5)
Chloride: 102 mEq/L (ref 96–112)
Creatinine, Ser: 1.07 mg/dL (ref 0.40–1.20)
GFR: 52.21 mL/min — ABNORMAL LOW (ref >60.00)
Glucose, Bld: 87 mg/dL (ref 70–99)
Potassium: 4.4 mEq/L (ref 3.5–5.1)
Sodium: 135 mEq/L (ref 135–145)
Total Bilirubin: 0.4 mg/dL (ref 0.2–1.2)
Total Protein: 8.5 g/dL — ABNORMAL HIGH (ref 6.0–8.3)

## 2015-10-10 LAB — TSH: TSH: 1.95 u[IU]/mL (ref 0.35–4.50)

## 2015-10-25 ENCOUNTER — Other Ambulatory Visit: Payer: Self-pay

## 2015-10-25 ENCOUNTER — Ambulatory Visit (INDEPENDENT_AMBULATORY_CARE_PROVIDER_SITE_OTHER): Payer: Medicare Other

## 2015-10-25 DIAGNOSIS — R600 Localized edema: Secondary | ICD-10-CM | POA: Diagnosis not present

## 2015-10-25 DIAGNOSIS — R011 Cardiac murmur, unspecified: Secondary | ICD-10-CM | POA: Diagnosis not present

## 2015-11-09 ENCOUNTER — Telehealth: Payer: Self-pay | Admitting: Family Medicine

## 2015-11-09 ENCOUNTER — Ambulatory Visit (INDEPENDENT_AMBULATORY_CARE_PROVIDER_SITE_OTHER): Payer: Medicare Other | Admitting: Internal Medicine

## 2015-11-09 ENCOUNTER — Encounter: Payer: Self-pay | Admitting: Internal Medicine

## 2015-11-09 VITALS — BP 132/56 | HR 85 | Temp 98.4°F | Wt 142.0 lb

## 2015-11-09 DIAGNOSIS — R002 Palpitations: Secondary | ICD-10-CM | POA: Diagnosis not present

## 2015-11-09 DIAGNOSIS — R42 Dizziness and giddiness: Secondary | ICD-10-CM | POA: Diagnosis not present

## 2015-11-09 NOTE — Progress Notes (Signed)
Subjective:    Patient ID: Jenna Young, female    DOB: 02-Jun-1931, 82 y.o.   MRN: [id number]  HPI  Pt presents to the clinic today with c/o intermittent fluttering of her heart with associated dizziness. She reports this started over the weekend. It has been intermittent. She denies visual changes, dizziness, chest pain or shortness of breath. She has not tried anything OTC for this. She has a history of vertigo and reports she takes Meclizine with some relief. She denies recent changed in medications. She is anxious at times, because she is living all by herself. She had a normal echo 10/26/15, but she does not see a cardiologist.  Review of Systems      Past Medical History:  Diagnosis Date  . Allergic rhinitis   . Anemia   . Anxiety   . GERD (gastroesophageal reflux disease)   . Hyperlipidemia   . Hypertension   . Osteopenia   . Osteoporosis     Current Outpatient Prescriptions  Medication Sig Dispense Refill  . amLODipine (NORVASC) 10 MG tablet TAKE 1 TABLET BY MOUTH EVERY DAY 30 tablet 11  . aspirin (ASPIRIN EC) 81 MG EC tablet Take 81 mg by mouth daily. Swallow whole.    . calcium-vitamin D (OSCAL WITH D) 500-200 MG-UNIT per tablet Take 2 tablets by mouth daily.      . cetirizine (ZYRTEC) 10 MG tablet Take 10 mg by mouth as needed.     . Cholecalciferol (VITAMIN D) 2000 UNITS CAPS Take 1 capsule by mouth daily.      . DimenhyDRINATE (DRAMAMINE) 50 MG CHEW Chew 1 tablet by mouth as needed.      Marland Kitchen losartan (COZAAR) 100 MG tablet TAKE 1 TABLET BY MOUTH EVERY DAY 30 tablet 11  . meclizine (ANTIVERT) 25 MG tablet Take 1 tablet (25 mg total) by mouth 3 (three) times daily as needed for dizziness. 30 tablet 3  . meloxicam (MOBIC) 15 MG tablet Take 1 tablet (15 mg total) by mouth daily as needed for pain (back pain). Take with a meal 30 tablet 1  . omeprazole (PRILOSEC) 20 MG capsule TAKE 1 CAPSULE BY MOUTH EVERY DAY 30 capsule 11  . fluticasone (FLONASE) 50 MCG/ACT nasal spray  Place 2 sprays into both nostrils daily. (Patient not taking: Reported on 11/08/2014) 16 g 11   No current facility-administered medications for this visit.     Allergies  Allergen Reactions  . Buspirone Hcl     REACTION: HEART PALPITATIONS  . Codeine     REACTION: nausea and vomiting  . Hydrochlorothiazide     REACTION: syncope-possibly from dehydration  . Lansoprazole     REACTION: abd pain  . Minocycline Hcl   . Penicillins     REACTION: mouth numbness  . Ramipril     Cough   . Sulfa Antibiotics Nausea Only    Family History  Problem Relation Age of Onset  . Hypertension Mother   . Heart disease Father   . Macular degeneration Sister   . Diabetes Maternal Aunt   . Cancer Maternal Aunt     throat    Social History   Social History  . Marital status: Widowed    Spouse name: N/A  . Number of children: 1  . Years of education: N/A   Occupational History  .  Retired   Social History Main Topics  . Smoking status: Former Smoker    Quit date: 03/11/1968  . Smokeless tobacco: Never Used  .  Alcohol use No  . Drug use: No  . Sexual activity: Not on file   Other Topics Concern  . Not on file   Social History Narrative  . No narrative on file     Constitutional: Denies fever, malaise, fatigue, headache or abrupt weight changes.  Respiratory: Denies difficulty breathing, shortness of breath, cough or sputum production.   Cardiovascular: Pt reports irregular heart beat. Denies chest pain, chest tightness, palpitations or swelling in the hands or feet.  Gastrointestinal: Denies abdominal pain, bloating, constipation, diarrhea or blood in the stool.  Neurological: Pt reports dizziness. Denies difficulty with memory, difficulty with speech or problems with balance and coordination.  Psych: Pt reports anxiety. Denies depression, SI/HI.  No other specific complaints in a complete review of systems (except as listed in HPI above).   Objective:   Physical  Exam   BP (!) 132/56   Pulse 85   Temp 98.4 F (36.9 C) (Oral)   Wt 142 lb (64.4 kg)   SpO2 97%   BMI 26.40 kg/m  Wt Readings from Last 3 Encounters:  11/09/15 142 lb (64.4 kg)  10/09/15 141 lb (64 kg)  05/06/15 130 lb (59 kg)    General: Appears herstated age, in NAD. Cardiovascular: Normal rate and rhythm. S1,S2 noted.   Pulmonary/Chest: Normal effort and positive vesicular breath sounds. No respiratory distress. No wheezes, rales or ronchi noted.  Neurological: Alert and oriented.  Coordination normal.  Psychiatric: Mood and affect normal.   BMET    Component Value Date/Time   NA 135 10/09/2015 1550   NA 136 10/22/2012 1914   K 4.4 10/09/2015 1550   K 3.8 10/22/2012 1914   CL 102 10/09/2015 1550   CL 103 10/22/2012 1914   CO2 24 10/09/2015 1550   CO2 27 10/22/2012 1914   GLUCOSE 87 10/09/2015 1550   GLUCOSE 106 (H) 10/22/2012 1914   BUN 15 10/09/2015 1550   BUN 13 10/22/2012 1914   CREATININE 1.07 10/09/2015 1550   CREATININE 0.95 10/22/2012 1914   CALCIUM 9.8 10/09/2015 1550   CALCIUM 9.1 10/22/2012 1914   GFRNONAA 49 (L) 05/06/2015 1416   GFRNONAA 57 (L) 10/22/2012 1914   GFRAA 57 (L) 05/06/2015 1416   GFRAA >60 10/22/2012 1914    Lipid Panel     Component Value Date/Time   CHOL 200 03/17/2015 0847   TRIG 167.0 (H) 03/17/2015 0847   HDL 51.50 03/17/2015 0847   CHOLHDL 4 03/17/2015 0847   VLDL 33.4 03/17/2015 0847   LDLCALC 115 (H) 03/17/2015 0847    CBC    Component Value Date/Time   WBC 7.6 10/09/2015 1550   RBC 4.09 10/09/2015 1550   HGB 10.8 (L) 10/09/2015 1550   HGB 11.3 (L) 07/27/2013 1318   HCT 32.3 (L) 10/09/2015 1550   HCT 33.7 (L) 07/27/2013 1318   PLT 264.0 10/09/2015 1550   PLT 257 07/27/2013 1318   MCV 79.2 10/09/2015 1550   MCV 80 07/27/2013 1318   MCH 26.6 05/06/2015 1416   MCHC 33.3 10/09/2015 1550   RDW 18.0 (H) 10/09/2015 1550   RDW 17.9 (H) 07/27/2013 1318   LYMPHSABS 2.9 10/09/2015 1550   LYMPHSABS 3.1 07/27/2013  1318   MONOABS 0.5 10/09/2015 1550   MONOABS 0.5 07/27/2013 1318   EOSABS 0.1 10/09/2015 1550   EOSABS 0.1 07/27/2013 1318   BASOSABS 0.1 10/09/2015 1550   BASOSABS 0.1 07/27/2013 1318    Hgb A1C Lab Results  Component Value Date   HGBA1C  11/15/2008    6.1 (NOTE) The ADA recommends the following therapeutic goal for glycemic control related to Hgb A1c measurement: Goal of therapy: <6.5 Hgb A1c  Reference: American Diabetes Association: Clinical Practice Recommendations 2010, Diabetes Care, 2010, 33: (Suppl  1).           Assessment & Plan:   Fluttering of chest, dizziness:  ECG today, essentially unchanged from prior, no arrhyhtmia, evidence of LVH Recent labs reviewed, no need to repeat Referral to cardiology for Holter monitor and further evaluation Continue Meclizine as needed  Follow up with PCP after you meet with the cardiologist Webb Silversmith, NP

## 2015-11-09 NOTE — Patient Instructions (Signed)

## 2015-11-09 NOTE — Telephone Encounter (Signed)
Pt has appt on 11/09/15 at 4 with Avie Echevaria NP

## 2015-11-09 NOTE — Telephone Encounter (Signed)
Patient Name: Jenna Young  DOB: 21-Nov-1931    Initial Comment Caller states c/o irregular heart rate, feels like a flutter and dizziness.   Nurse Assessment  Nurse: Raphael Gibney, RN, Vanita Ingles Date/Time Eilene Ghazi Time): 11/09/2015 2:48:45 PM  Confirm and document reason for call. If symptomatic, describe symptoms. You must click the next button to save text entered. ---Caller states she has irregular heart beat and she feels dizzy when it happens. Does not last long. No symptoms now.  Has the patient traveled out of the country within the last 30 days? ---Not Applicable  Does the patient have any new or worsening symptoms? ---Yes  Will a triage be completed? ---Yes  Related visit to physician within the last 2 weeks? ---No  Does the PT have any chronic conditions? (i.e. diabetes, asthma, etc.) ---No  Is this a behavioral health or substance abuse call? ---No     Guidelines    Guideline Title Affirmed Question Affirmed Notes  Heart Rate and Heartbeat Questions Age > 60 years (Exception: brief heart beat symptoms that went away and now feels well)    Final Disposition User   See Physician within 4 Hours (or PCP triage) Raphael Gibney, RN, Vanita Ingles    Comments  appt scheduled for 4 pm with Webb Silversmith on 11/09/2015   Referrals  REFERRED TO PCP OFFICE   Disagree/Comply: Comply

## 2015-11-16 ENCOUNTER — Encounter: Payer: Self-pay | Admitting: Cardiovascular Disease

## 2015-11-16 ENCOUNTER — Ambulatory Visit (INDEPENDENT_AMBULATORY_CARE_PROVIDER_SITE_OTHER): Payer: Medicare Other | Admitting: Cardiovascular Disease

## 2015-11-16 VITALS — BP 144/68 | HR 83 | Ht 62.0 in | Wt 140.5 lb

## 2015-11-16 DIAGNOSIS — D649 Anemia, unspecified: Secondary | ICD-10-CM

## 2015-11-16 DIAGNOSIS — R600 Localized edema: Secondary | ICD-10-CM | POA: Diagnosis not present

## 2015-11-16 DIAGNOSIS — R002 Palpitations: Secondary | ICD-10-CM | POA: Diagnosis not present

## 2015-11-16 DIAGNOSIS — R011 Cardiac murmur, unspecified: Secondary | ICD-10-CM | POA: Diagnosis not present

## 2015-11-16 DIAGNOSIS — I10 Essential (primary) hypertension: Secondary | ICD-10-CM | POA: Diagnosis not present

## 2015-11-16 NOTE — Progress Notes (Signed)
Cardiology Office Note  Date:  11/16/2015   ID:  LATIGRA MIKRUT, DOB 05/07/1931, MRN [id number]  PCP:  Loura Pardon, MD   Chief Complaint  Patient presents with  . Other    Ref by Webb Silversmith for dizziness. Pt. c/o LE edema in legs, feet and ankles. Meds reviewed by the patient verbally.     HPI:  Ms. Streett is a pleasant 82 year old woman who presents by referral from Dr. Glori Bickers for evaluation of palpitations, murmur, leg edema. Prior history of vertigo  She reports the past 2 weeks having rare episodes of palpitations. Describes it as a hard beat followed by very brief episode of dizziness, recovers quickly She has not had any symptoms for several days Does not know what triggered the symptoms, denies having long. Palpitations, no tachycardia, only describes it as a single solitary strong beat  Otherwise she is active, exercises several days per week at the gym  Recently seen by Dr. Glori Bickers, murmur appreciated Echocardiogram ordered that showed no significant valve disease, mild MR  She reports that she lives alone, husband died 6 years ago, lonely at times  Lab work reviewed showing total cholesterol 200, LDL 115 Hematocrit 32 Previous abdominal symptoms with iron pills, dizziness, possible vasovagal symptoms  EKG on today's visit shows normal sinus rhythm with rate 84 bpm, intraventricular conduction delay, left axis deviation   PMH:   has a past medical history of Allergic rhinitis; Anemia; Anxiety; GERD (gastroesophageal reflux disease); Hyperlipidemia; Hypertension; Osteopenia; and Osteoporosis.  PSH:    Past Surgical History:  Procedure Laterality Date  . APPENDECTOMY    . BREAST SURGERY  10/10   benign biopsy  negative  . CESAREAN SECTION    . CHOLECYSTECTOMY    . FRACTURE SURGERY  07/2008   neck  . OVARIAN CYST SURGERY      Current Outpatient Prescriptions  Medication Sig Dispense Refill  . aspirin (ASPIRIN EC) 81 MG EC tablet Take 81 mg by mouth daily. Swallow  whole.    . calcium-vitamin D (OSCAL WITH D) 500-200 MG-UNIT per tablet Take 2 tablets by mouth daily.      . cetirizine (ZYRTEC) 10 MG tablet Take 10 mg by mouth as needed.     . Cholecalciferol (VITAMIN D) 2000 UNITS CAPS Take 1 capsule by mouth daily.      . DimenhyDRINATE (DRAMAMINE) 50 MG CHEW Chew 1 tablet by mouth as needed.      Marland Kitchen losartan (COZAAR) 100 MG tablet TAKE 1 TABLET BY MOUTH EVERY DAY 30 tablet 11  . meclizine (ANTIVERT) 25 MG tablet Take 1 tablet (25 mg total) by mouth 3 (three) times daily as needed for dizziness. 30 tablet 3  . meloxicam (MOBIC) 15 MG tablet Take 1 tablet (15 mg total) by mouth daily as needed for pain (back pain). Take with a meal 30 tablet 1  . omeprazole (PRILOSEC) 20 MG capsule TAKE 1 CAPSULE BY MOUTH EVERY DAY 30 capsule 11  . fluticasone (FLONASE) 50 MCG/ACT nasal spray Place 2 sprays into both nostrils daily. (Patient not taking: Reported on 11/08/2014) 16 g 11   No current facility-administered medications for this visit.      Allergies:   Buspirone hcl; Codeine; Hydrochlorothiazide; Lansoprazole; Minocycline hcl; Penicillins; Ramipril; and Sulfa antibiotics   Social History:  The patient  reports that she quit smoking about 47 years ago. She has never used smokeless tobacco. She reports that she does not drink alcohol or use drugs.   Family History:  family history includes Cancer in her maternal aunt; Diabetes in her maternal aunt; Heart disease in her father; Hypertension in her mother; Macular degeneration in her sister.    Review of Systems: Review of Systems  Constitutional: Negative.   Respiratory: Negative.   Cardiovascular: Positive for palpitations.  Gastrointestinal: Negative.   Musculoskeletal: Negative.   Neurological: Negative.   Psychiatric/Behavioral: Negative.   All other systems reviewed and are negative.    PHYSICAL EXAM: VS:  BP (!) 144/68 (BP Location: Left Arm, Patient Position: Sitting, Cuff Size: Normal)    Pulse 83   Ht 5\' 2"  (1.575 m)   Wt 140 lb 8 oz (63.7 kg)   BMI 25.70 kg/m  , BMI Body mass index is 25.7 kg/m. GEN: Well nourished, well developed, in no acute distress  HEENT: normal  Neck: no JVD, carotid bruits, or masses Cardiac: RRR; 2+ systolic ejection murmur right sternal border, no rubs, or gallops,no edema  Respiratory:  clear to auscultation bilaterally, normal work of breathing GI: soft, nontender, nondistended, + BS MS: no deformity or atrophy  Skin: warm and dry, no rash Neuro:  Strength and sensation are intact Psych: euthymic mood, full affect    Recent Labs: 10/09/2015: ALT 10; BUN 15; Creatinine, Ser 1.07; Hemoglobin 10.8; Platelets 264.0; Potassium 4.4; Sodium 135; TSH 1.95    Lipid Panel Lab Results  Component Value Date   CHOL 200 03/17/2015   HDL 51.50 03/17/2015   LDLCALC 115 (H) 03/17/2015   TRIG 167.0 (H) 03/17/2015      Wt Readings from Last 3 Encounters:  11/16/15 140 lb 8 oz (63.7 kg)  11/09/15 142 lb (64.4 kg)  10/09/15 141 lb (64 kg)       ASSESSMENT AND PLAN:  Essential hypertension, benign - Plan: EKG 12-Lead She is requesting a change to her amlodipine. She feels this is contributing to her lower extremity edema. She prefers to take her medications daily Options include bystolic, HCTZ Other medications taken with him once a day include clonidine, hydralazine Samples provided of bystolic 10 mg daily. If this is expensive, would try alternate Recommended she monitor her blood pressure at home  Bilateral lower extremity edema  Venous insufficiency, possibly exacerbation by the amlodipine She is requesting a change  Heart murmur Likely aortic valve sclerosis with no stenosis Recent normal echocardiogram  Palpitations Likely having rare PVCs Not having any recently, Discussed various treatment options including Holter monitor or 30 day monitor Suggested we wait for now, will try the beta blocker as above  Anemia, unspecified  anemia type Recommended she increase her iron intake. Does not want iron pill given previous GI issues   Total encounter time more than 45 minutes  Greater than 50% was spent in counseling and coordination of care with the patient   Disposition:   F/U  6 months   Orders Placed This Encounter  Procedures  . EKG 12-Lead     Signed, Esmond Plants, M.D., Ph.D. 11/16/2015  [employer], [place]

## 2015-11-16 NOTE — Patient Instructions (Signed)
Medication Instructions:   Please stop the amlodipine  Please start bystolic one a day If this is expensive, call the office   Call the office if you develop increasing frequency of the extra beats A monitor could be order  Labwork:  No new labs needed  Testing/Procedures:  No further testing at this time   Follow-Up: It was a pleasure seeing you in the office today. Please call us if you have new issues that need to be addressed before your next appt.  [phone]  Your physician wants you to follow-up in: 6 months.  You will receive a reminder letter in the mail two months in advance. If you don't receive a letter, please call our office to schedule the follow-up appointment.  If you need a refill on your cardiac medications before your next appointment, please call your pharmacy.

## 2015-12-08 ENCOUNTER — Telehealth: Payer: Self-pay | Admitting: Cardiovascular Disease

## 2015-12-08 NOTE — Telephone Encounter (Signed)
Pt calling stating since we changed her bp medication and up until now its been going good. Just took her BP and it is 181/59  Pt c/o BP issue: STAT if pt c/o blurred vision, one-sided weakness or slurred speech  1. What are your last 5 BP readings?  12/08/15 : 181/59  12/07/15: 191/60 last night    2. Are you having any other symptoms (ex. Dizziness, headache, blurred vision, passed out)? No   3. What is your BP issue? Just running a bit high past two days

## 2015-12-08 NOTE — Telephone Encounter (Signed)
Spoke w/ pt.  She reports that she ate teriyaki chicken @ [employer] 2 nights ago, BP has been elevated since. Advised her that this is full of sodium and that her BP should normalize soon. Advised her to monitor BP over the weekend and call me back on Monday if numbers remain elevated. She is appreciative and will call this weekend to the PA service if she is concerned about readings before that time.

## 2015-12-11 ENCOUNTER — Telehealth: Payer: Self-pay | Admitting: Cardiovascular Disease

## 2015-12-11 MED ORDER — HYDROCHLOROTHIAZIDE 25 MG PO TABS: 25.0000 mg | ORAL_TABLET | ORAL | 6 refills | Status: DC

## 2015-12-11 NOTE — Telephone Encounter (Signed)
Spoke w/ pt.  She reports that she is taking Bystolic 10 mg daily - has not missed any doses. Her leg swelling has almost completely resolved.  She is quite please w/ this.  Advised her that I discussed w/ Dr. Rockey Situ and he recommends that pt try HCTZ 25 mg QOD. Advised him that pt has an allergy listed in her chart.  After discussing w/ pt, she nor Dr. Rockey Situ feel that this is a true allergy. Pt took this some time ago and after walking around on a hot day, she became dehydrated.  She is willing to try this med again and is unsure why this is listed as an allergy in her chart.  She will continue to monitor her BP and call back in a few days w/ readings.  She is appreciative of the call.

## 2015-12-11 NOTE — Telephone Encounter (Signed)
Pt c/o BP issue: STAT if pt c/o blurred vision, one-sided weakness or slurred speech  1. What are your last 5 BP readings?  12/09/15:  187/59 Afternoon  182/54 Evening 12/10/15: 151/55 Afternoon 12/11/15  177/59 Morning Went to the gym and came back waited a bit and it was 171/45 HR 55   2. Are you having any other symptoms (ex. Dizziness, headache, blurred vision, passed out)? no   3. What is your BP issue? BP is high and now lower number is getting to low

## 2015-12-18 ENCOUNTER — Other Ambulatory Visit: Payer: Self-pay

## 2015-12-18 DIAGNOSIS — Z1231 Encounter for screening mammogram for malignant neoplasm of breast: Secondary | ICD-10-CM

## 2015-12-19 ENCOUNTER — Encounter: Payer: Self-pay | Admitting: Internal Medicine

## 2015-12-20 ENCOUNTER — Other Ambulatory Visit: Payer: Self-pay | Admitting: *Deleted

## 2015-12-20 ENCOUNTER — Telehealth: Payer: Self-pay | Admitting: Cardiovascular Disease

## 2015-12-20 MED ORDER — NEBIVOLOL HCL 10 MG PO TABS: 10.0000 mg | ORAL_TABLET | Freq: Every day | ORAL | 3 refills | Status: DC

## 2015-12-20 NOTE — Telephone Encounter (Signed)
°*  STAT* If patient is at the pharmacy, call can be transferred to refill team.   1. Which medications need to be refilled? (please list name of each medication and dose if known)  Hydrochlorothiazide   2. Which pharmacy/location (including street and city if local pharmacy) is medication to be sent to? [facility] on [address]   3. Do they need a 30 day or 90 day supply? 90 day

## 2015-12-20 NOTE — Telephone Encounter (Signed)
Bystolic 10 mg tablet has been added to medication list.  Bystolic 10 mg tablet refill sent to local pharmacy.

## 2015-12-20 NOTE — Telephone Encounter (Signed)
Pt calling in the correct refill that needs to be sent in bystolic 10 mg sent to [facility] on [address]

## 2015-12-21 ENCOUNTER — Telehealth: Payer: Self-pay | Admitting: Cardiovascular Disease

## 2015-12-21 ENCOUNTER — Ambulatory Visit (INDEPENDENT_AMBULATORY_CARE_PROVIDER_SITE_OTHER): Payer: Medicare Other

## 2015-12-21 DIAGNOSIS — E538 Deficiency of other specified B group vitamins: Secondary | ICD-10-CM | POA: Diagnosis not present

## 2015-12-21 DIAGNOSIS — Z23 Encounter for immunization: Secondary | ICD-10-CM

## 2015-12-21 MED ORDER — CYANOCOBALAMIN 1000 MCG/ML IJ SOLN
1000.0000 ug | Freq: Once | INTRAMUSCULAR | Status: AC
  Administered 2015-12-21: 1000 ug via INTRAMUSCULAR

## 2015-12-21 NOTE — Telephone Encounter (Signed)
Please review medication, the patient can't afford.

## 2015-12-21 NOTE — Telephone Encounter (Signed)
Several other generics we could try One would be clonidine 0.1 mg twice a day (preferably not to be taken once a day as she might get rebound hypertension) Would monitor BP

## 2015-12-21 NOTE — Telephone Encounter (Signed)
Pt calling stating she can't afford to pay 400 a month for her to get the Bystolic  Would like to know if she can have an alternative or if there's something to help with the cost.  Please advise.

## 2015-12-22 NOTE — Telephone Encounter (Signed)
Left message for pt to call back  °

## 2015-12-25 MED ORDER — CLONIDINE HCL 0.1 MG PO TABS: 0.1000 mg | ORAL_TABLET | Freq: Two times a day (BID) | ORAL | 6 refills | Status: DC

## 2015-12-25 NOTE — Telephone Encounter (Signed)
Spoke w/ pt.  Advised her of Dr. Donivan Scull recommendation.  She is agreeable to trying clonidine. Advised her to monitor BP and call back w/ any questions or concerns.

## 2015-12-25 NOTE — Telephone Encounter (Signed)
Pt is returning your call

## 2015-12-31 ENCOUNTER — Emergency Department: Payer: Medicare Other

## 2015-12-31 ENCOUNTER — Encounter: Payer: Self-pay | Admitting: Emergency Medicine

## 2015-12-31 ENCOUNTER — Emergency Department
Admission: EM | Admit: 2015-12-31 | Discharge: 2015-12-31 | Disposition: A | Discharge status: Home / Self Care | Payer: Medicare Other | Attending: Emergency Medicine | Admitting: Emergency Medicine

## 2015-12-31 DIAGNOSIS — I10 Essential (primary) hypertension: Secondary | ICD-10-CM | POA: Diagnosis not present

## 2015-12-31 DIAGNOSIS — Z5181 Encounter for therapeutic drug level monitoring: Secondary | ICD-10-CM | POA: Insufficient documentation

## 2015-12-31 DIAGNOSIS — E860 Dehydration: Secondary | ICD-10-CM | POA: Diagnosis not present

## 2015-12-31 DIAGNOSIS — Z79899 Other long term (current) drug therapy: Secondary | ICD-10-CM | POA: Diagnosis not present

## 2015-12-31 DIAGNOSIS — Z7982 Long term (current) use of aspirin: Secondary | ICD-10-CM | POA: Diagnosis not present

## 2015-12-31 DIAGNOSIS — R1111 Vomiting without nausea: Secondary | ICD-10-CM | POA: Diagnosis not present

## 2015-12-31 DIAGNOSIS — Z7951 Long term (current) use of inhaled steroids: Secondary | ICD-10-CM | POA: Insufficient documentation

## 2015-12-31 DIAGNOSIS — R55 Syncope and collapse: Secondary | ICD-10-CM | POA: Diagnosis not present

## 2015-12-31 DIAGNOSIS — R531 Weakness: Secondary | ICD-10-CM | POA: Diagnosis not present

## 2015-12-31 DIAGNOSIS — Z87891 Personal history of nicotine dependence: Secondary | ICD-10-CM | POA: Insufficient documentation

## 2015-12-31 LAB — URINALYSIS COMPLETE WITH MICROSCOPIC (ARMC ONLY)
Bilirubin Urine: NEGATIVE
Glucose, UA: NEGATIVE mg/dL
Hgb urine dipstick: NEGATIVE
Ketones, ur: NEGATIVE mg/dL
Leukocytes, UA: NEGATIVE
Nitrite: NEGATIVE
Protein, ur: 30 mg/dL — AB
Specific Gravity, Urine: 1.016 (ref 1.005–1.030)
pH: 5 (ref 5.0–8.0)

## 2015-12-31 LAB — COMPREHENSIVE METABOLIC PANEL
ALT: 14 U/L (ref 14–54)
AST: 22 U/L (ref 15–41)
Albumin: 4 g/dL (ref 3.5–5.0)
Alkaline Phosphatase: 77 U/L (ref 38–126)
Anion gap: 10 (ref 5–15)
BUN: 20 mg/dL (ref 6–20)
CO2: 25 mmol/L (ref 22–32)
Calcium: 9.1 mg/dL (ref 8.9–10.3)
Chloride: 94 mmol/L — ABNORMAL LOW (ref 101–111)
Creatinine, Ser: 1.35 mg/dL — ABNORMAL HIGH (ref 0.44–1.00)
GFR calc Af Amer: 41 mL/min — ABNORMAL LOW (ref >60)
GFR calc non Af Amer: 36 mL/min — ABNORMAL LOW (ref >60)
Glucose, Bld: 113 mg/dL — ABNORMAL HIGH (ref 65–99)
Potassium: 3.8 mmol/L (ref 3.5–5.1)
Sodium: 129 mmol/L — ABNORMAL LOW (ref 135–145)
Total Bilirubin: 0.5 mg/dL (ref 0.3–1.2)
Total Protein: 8 g/dL (ref 6.5–8.1)

## 2015-12-31 LAB — CBC WITH DIFFERENTIAL/PLATELET
Basophils Absolute: 0 10*3/uL (ref 0–0.1)
Basophils Relative: 1 %
Eosinophils Absolute: 0.2 10*3/uL (ref 0–0.7)
Eosinophils Relative: 2 %
HCT: 34.4 % — ABNORMAL LOW (ref 35.0–47.0)
Hemoglobin: 11.4 g/dL — ABNORMAL LOW (ref 12.0–16.0)
Lymphocytes Relative: 50 %
Lymphs Abs: 3.7 10*3/uL — ABNORMAL HIGH (ref 1.0–3.6)
MCH: 26.4 pg (ref 26.0–34.0)
MCHC: 33.2 g/dL (ref 32.0–36.0)
MCV: 79.3 fL — ABNORMAL LOW (ref 80.0–100.0)
Monocytes Absolute: 0.7 10*3/uL (ref 0.2–0.9)
Monocytes Relative: 9 %
Neutro Abs: 2.7 10*3/uL (ref 1.4–6.5)
Neutrophils Relative %: 38 %
Platelets: 261 10*3/uL (ref 150–440)
RBC: 4.34 MIL/uL (ref 3.80–5.20)
RDW: 16.2 % — ABNORMAL HIGH (ref 11.5–14.5)
WBC: 7.2 10*3/uL (ref 3.6–11.0)

## 2015-12-31 LAB — TROPONIN I: Troponin I: <0.03 ng/mL (ref <0.03)

## 2015-12-31 LAB — TYPE AND SCREEN
ABO/RH(D): A POS
Antibody Screen: NEGATIVE

## 2015-12-31 LAB — PROTIME-INR
INR: 1.08
Prothrombin Time: 14 seconds (ref 11.4–15.2)

## 2015-12-31 MED ORDER — SODIUM CHLORIDE 0.9 % IV BOLUS (SEPSIS)
500.0000 mL | Freq: Once | INTRAVENOUS | Status: AC
  Administered 2015-12-31: 500 mL via INTRAVENOUS

## 2015-12-31 NOTE — ED Triage Notes (Signed)
Patient brought in by [facility] from home, patient was sitting on swing when patient son found her unconscious, slummed over, and drooling. Patient son took patient inside and placed her on the toilet where she had an episode of diarrhea and vomiting. EMS reports that stool was black and tarry.   Patient took first dose of Clonidine this morning.

## 2015-12-31 NOTE — Discharge Instructions (Signed)
You have been feeling somewhat weak because of the medication that he took today, clonidine can do this. Please talk to your doctor first thing in the morning about whether they wish to continue on this medication. Return to the emergency room feeling any new or worrisome symptoms refill worse in any way.

## 2015-12-31 NOTE — ED Provider Notes (Addendum)
[facility] Emergency Department Provider Note  ____________________________________________   I have reviewed the triage vital signs and the nursing notes.   HISTORY  Chief Complaint Loss of Consciousness    HPI Jenna Young is a 82 y.o. female who was started on clonidine approximately 3 or 4 days ago. Today she was in her normal state of health, she was sitting on a sliding swing and she believes that she fell sleep. Her son found her out there. She did not have a seizure she was not apparently difficult to arouse. However there is concern about her doing this and she went inside had a large non-melanotic nonbloody bowel movement. She was brought in after that. No antecedent diarrhea. No fever no chills no chest pain or shortness of breath. She has no complaints at this time.    Past Medical History:  Diagnosis Date  . Allergic rhinitis   . Anemia   . Anxiety   . GERD (gastroesophageal reflux disease)   . Hyperlipidemia   . Hypertension   . Osteopenia   . Osteoporosis     Patient Active Problem List   Diagnosis Date Noted  . Varicose vein of leg 10/09/2015  . Fatigue 10/09/2015  . Pedal edema 10/09/2015  . Heart murmur 10/09/2015  . Spinal compression fracture ([place]) 05/07/2015  . Lumbar pain 05/03/2015  . Lipoma of face 11/08/2014  . Abnormal urinalysis 09/20/2013  . Back contusion 09/20/2013  . Dysthymia 09/20/2013  . Encounter for Medicare annual wellness exam 02/03/2013  . Vaso-vagal reaction 09/25/2012  . BPPV (benign paroxysmal positional vertigo) 06/15/2012  . Chronic right ear pain 03/16/2012  . Colon cancer screening 01/17/2012  . Anemia 01/17/2012  . Vertigo 11/25/2011  . GRIEF REACTION 08/01/2009  . Vitamin D deficiency 08/19/2008  . ANXIETY 01/29/2008  . Essential hypertension, benign 09/25/2007  . B12 deficiency 09/24/2006  . Hyperlipidemia 09/24/2006  . Venous (peripheral) insufficiency 09/24/2006  . ALLERGIC RHINITIS  09/24/2006  . GERD 09/24/2006  . Osteoporosis 09/24/2006    Past Surgical History:  Procedure Laterality Date  . APPENDECTOMY    . BREAST SURGERY  10/10   benign biopsy  negative  . CESAREAN SECTION    . CHOLECYSTECTOMY    . FRACTURE SURGERY  07/2008   neck  . OVARIAN CYST SURGERY      Prior to Admission medications   Medication Sig Start Date End Date Taking? Authorizing Provider  aspirin (ASPIRIN EC) 81 MG EC tablet Take 81 mg by mouth daily. Swallow whole.    Historical Provider, MD  calcium-vitamin D (OSCAL WITH D) 500-200 MG-UNIT per tablet Take 2 tablets by mouth daily.      Historical Provider, MD  cetirizine (ZYRTEC) 10 MG tablet Take 10 mg by mouth as needed.     Historical Provider, MD  Cholecalciferol (VITAMIN D) 2000 UNITS CAPS Take 1 capsule by mouth daily.      Historical Provider, MD  cloNIDine (CATAPRES) 0.1 MG tablet Take 1 tablet (0.1 mg total) by mouth 2 (two) times daily. 12/25/15   Minna Merritts, MD  DimenhyDRINATE (DRAMAMINE) 50 MG CHEW Chew 1 tablet by mouth as needed.      Historical Provider, MD  fluticasone (FLONASE) 50 MCG/ACT nasal spray Place 2 sprays into both nostrils daily. Patient not taking: Reported on 11/08/2014 02/02/13 03/17/15  Abner Greenspan, MD  hydrochlorothiazide (HYDRODIURIL) 25 MG tablet Take 1 tablet (25 mg total) by mouth every other day. 12/11/15 03/10/16  Kathlene November  Rockey Situ, MD  losartan (COZAAR) 100 MG tablet TAKE 1 TABLET BY MOUTH EVERY DAY 05/30/15   Abner Greenspan, MD  meclizine (ANTIVERT) 25 MG tablet Take 1 tablet (25 mg total) by mouth 3 (three) times daily as needed for dizziness. 12/15/13   Abner Greenspan, MD  meloxicam (MOBIC) 15 MG tablet Take 1 tablet (15 mg total) by mouth daily as needed for pain (back pain). Take with a meal 05/03/15   Abner Greenspan, MD  omeprazole (PRILOSEC) 20 MG capsule TAKE 1 CAPSULE BY MOUTH EVERY DAY 02/14/15   Abner Greenspan, MD    Allergies Buspirone hcl; Codeine; Hydrochlorothiazide; Lansoprazole;  Minocycline hcl; Penicillins; Ramipril; and Sulfa antibiotics  Family History  Problem Relation Age of Onset  . Hypertension Mother   . Heart disease Father   . Macular degeneration Sister   . Diabetes Maternal Aunt   . Cancer Maternal Aunt     throat    Social History Social History  Substance Use Topics  . Smoking status: Former Smoker    Quit date: 03/11/1968  . Smokeless tobacco: Never Used  . Alcohol use No    Review of Systems Constitutional: No fever/chills Eyes: No visual changes. ENT: No sore throat. No stiff neck no neck pain Cardiovascular: Denies chest pain. Respiratory: Denies shortness of breath. Gastrointestinal:   no vomiting.  No diarrhea.  No constipation. Genitourinary: Negative for dysuria. Musculoskeletal: Negative lower extremity swelling Skin: Negative for rash. Neurological: Negative for severe headaches, focal weakness or numbness. 10-point ROS otherwise negative.  ____________________________________________   PHYSICAL EXAM:  VITAL SIGNS: ED Triage Vitals  Enc Vitals Group     BP 12/31/15 1612 (!) 145/53     Pulse Rate 12/31/15 1612 (!) 50     Resp 12/31/15 1612 16     Temp 12/31/15 1612 97.8 F (36.6 C)     Temp Source 12/31/15 1612 Oral     SpO2 12/31/15 1612 97 %     Weight 12/31/15 1613 181 lb 12.8 oz (82.5 kg)     Height 12/31/15 1613 5\' 5"  (1.651 m)     Head Circumference --      Peak Flow --      Pain Score --      Pain Loc --      Pain Edu? --      Excl. in [place]? --     Constitutional: Alert and oriented. Well appearing and in no acute distress. Eyes: Conjunctivae are normal. PERRL. EOMI. Head: Atraumatic. Nose: No congestion/rhinnorhea. Mouth/Throat: Mucous membranes are moist.  Oropharynx non-erythematous. Neck: No stridor.   Nontender with no meningismus Cardiovascular: Normal rate, regular rhythm. Grossly normal heart sounds.  Good peripheral circulation. Respiratory: Normal respiratory effort.  No retractions. Lungs  CTAB. Abdominal: Soft and nontender. No distention. No guarding no rebound Rectal exam: Guaiac negative brown stool Back:  There is no focal tenderness or step off.  there is no midline tenderness there are no lesions noted. there is no CVA tenderness Musculoskeletal: No lower extremity tenderness, no upper extremity tenderness. No joint effusions, no DVT signs strong distal pulses no edema Neurologic:  Normal speech and language. No gross focal neurologic deficits are appreciated.  Skin:  Skin is warm, dry and intact. No rash noted. Psychiatric: Mood and affect are normal. Speech and behavior are normal.  ____________________________________________   LABS (all labs ordered are listed, but only abnormal results are displayed)  Labs Reviewed  COMPREHENSIVE METABOLIC PANEL - Abnormal; Notable for  the following:       Result Value   Sodium 129 (*)    Chloride 94 (*)    Glucose, Bld 113 (*)    Creatinine, Ser 1.35 (*)    GFR calc non Af Amer 36 (*)    GFR calc Af Amer 41 (*)    All other components within normal limits  CBC WITH DIFFERENTIAL/PLATELET - Abnormal; Notable for the following:    Hemoglobin 11.4 (*)    HCT 34.4 (*)    MCV 79.3 (*)    RDW 16.2 (*)    Lymphs Abs 3.7 (*)    All other components within normal limits  PROTIME-INR  TROPONIN I  URINALYSIS COMPLETEWITH MICROSCOPIC ([facility] ONLY)  TYPE AND SCREEN   ____________________________________________  EKG  I personally interpreted any EKGs ordered by me or triage Sinus rhythm rate 51 bpm no acute ST elevation or depression normal axis with ____________________________________________  RADIOLOGY  I reviewed any imaging ordered by me or triage that were performed during my shift and, if possible, patient and/or family made aware of any abnormal findings. ____________________________________________   PROCEDURES  Procedure(s) performed: None  Procedures  Critical Care performed:  None  ____________________________________________   INITIAL IMPRESSION / ASSESSMENT AND PLAN / ED COURSE  Pertinent labs & imaging results that were available during my care of the patient were reviewed by me and considered in my medical decision making (see chart for details).   Patient either fell sleep or had a syncopal event today. She feels that she had a episode of falling asleep. She is in no acute distress at this time. She did just start clonidine which can cause drowsiness. Workup is otherwise unremarkable no evidence of acute infection. Her sodium is somewhat low, but not dangerously so we are repleting it with IV fluid. She states she feels that she might be somewhat dehydrated. No evidence of significant GI bleed, no evidence of acute ACS no evidence of significant dysrhythmia noted while she is here. Heart rate at this time is 62. Blood pressures have remained steady. She is asymptomatic. No evidence of pneumonia but we will obtain a chest x-ray, no complaints of urinary tract infection but we will obtain a urine. We are giving her IV fluid and we'll reassess.  ----------------------------------------- 7:43 PM on 12/31/2015 -----------------------------------------  We discussed admission patient feels 100% better, she never actually fell that while she was here, she ambulated throughout the department with no difficulty, and she would prefer very much to go home. I do suspect that some of her symptoms such as they were, where likely influenced by her clonidine and I have asked her to hold it in the morning pending a discussion with her doctor about the advisability of this medication for her. Patient is supposed to be taking 2 a day but has only been taking one because she feels it is quite strong and makes her somewhat sleepy. I did discuss with her son, he states that she wasn't truly unconscious she just appeared to be sleeping in the sun and he feels that she was somewhat  dehydrated. There wasn't any evidence of seizure activity or true syncope she did not fall. As she has no symptoms and no concerns or blood work is reassuring her sodium is slightly low but again we are replacing that, they will follow-up closely with primary care.  Clinical Course   ____________________________________________   FINAL CLINICAL IMPRESSION(S) / ED DIAGNOSES  Final diagnoses:  None  This chart was dictated using voice recognition software.  Despite best efforts to proofread,  errors can occur which can change meaning.      Schuyler Amor, MD 12/31/15 [place], MD 12/31/15 [phone]

## 2015-12-31 NOTE — ED Notes (Signed)
Patient given ginger ale; patient unable to urinate at this time

## 2016-01-01 ENCOUNTER — Telehealth: Payer: Self-pay | Admitting: Cardiovascular Disease

## 2016-01-01 NOTE — Telephone Encounter (Signed)
Pt c/o BP issue: STAT if pt c/o blurred vision, one-sided weakness or slurred speech  1. What are your last 5 BP readings? 01/01/16 8:45 134/57 and 12:30 pm 148/58.  2. Are you having any other symptoms (ex. Dizziness, headache, blurred vision, passed out)? Patient passed out yesterday and went to the ED at [facility].  3. What is your BP issue? Not sure why she passed out. Please call patient.

## 2016-01-01 NOTE — Telephone Encounter (Signed)
She needs an ED f/u appt.

## 2016-01-02 ENCOUNTER — Ambulatory Visit: Payer: Medicare Other

## 2016-01-02 NOTE — Telephone Encounter (Signed)
Made appt with Christell Faith 01-03-16 at 3 pm.  Has questions about medications.

## 2016-01-02 NOTE — Telephone Encounter (Signed)
Patient was started on clonidine and she passed out. Blood pressure today was 151/61 and she has been afraid to take anything since she passed out on Sunday in the swing. She is coming in tomorrow to see Christell Faith PA and let her know that he could review her medications with her at that time. Instructed her to call 911 in the meantime if she has any more symptoms. She verbalized understanding and had no further questions at this time.

## 2016-01-03 ENCOUNTER — Encounter: Payer: Self-pay | Admitting: Physician Assistant

## 2016-01-03 ENCOUNTER — Ambulatory Visit (INDEPENDENT_AMBULATORY_CARE_PROVIDER_SITE_OTHER): Payer: Medicare Other | Admitting: Physician Assistant

## 2016-01-03 VITALS — BP 196/80 | HR 59 | Ht 61.0 in | Wt 140.2 lb

## 2016-01-03 DIAGNOSIS — N179 Acute kidney failure, unspecified: Secondary | ICD-10-CM | POA: Diagnosis not present

## 2016-01-03 DIAGNOSIS — R400 Somnolence: Secondary | ICD-10-CM

## 2016-01-03 DIAGNOSIS — D649 Anemia, unspecified: Secondary | ICD-10-CM

## 2016-01-03 DIAGNOSIS — R002 Palpitations: Secondary | ICD-10-CM

## 2016-01-03 DIAGNOSIS — I10 Essential (primary) hypertension: Secondary | ICD-10-CM | POA: Diagnosis not present

## 2016-01-03 DIAGNOSIS — E871 Hypo-osmolality and hyponatremia: Secondary | ICD-10-CM

## 2016-01-03 MED ORDER — ISOSORBIDE MONONITRATE ER 30 MG PO TB24: 30.0000 mg | ORAL_TABLET | Freq: Every day | ORAL | 3 refills | Status: DC

## 2016-01-03 NOTE — Progress Notes (Signed)
Cardiology Office Note Date:  01/03/2016  Patient ID:  Jenna Young, DOB 11/06/1931, MRN [id number] PCP:  Loura Pardon, MD  Cardiologist:  Dr. Rockey Situ, MD    Chief Complaint: ED follow up  History of Present Illness: Jenna Young is a 82 y.o. female with history of HTN, HLD, anxiety, reflux, palpitations, murmur, and LE swelling who presents for ED follow up following a possible syncopal episode on a swing.   She was initially seen by Dr. Rockey Situ on 11/16/2015 for evaluation of the above. At that time she reported a 2 week history of rare palpitations followed by a brief period of dizziness that quickly resolves. Recent echo on 10/25/15 showed EF 60-65%, normal wall motion, GR1DD, mild TR, normal RV systolic function, left atrium normal in size, PASP normal. Her amlodipine was stopped at her September visit as it was felt this may have been contributing to her LE swelling. She was started on Bystolic with good results. She was noted to have some dietary indiscretion that led to intermittent flucuations in her BP and the addition of HCTZ every other day. Unfortunately, she could not afford Bystolic long term and was changed to clonidine.   She was seen in the ED on 10/22 after sitting on a swing outside and possibly fell asleep. She was easily arroused by her son, went inside and had a large bowel movement. No known syncope, LOC, or fall. No chest pain, palpitations, SOB, dizziness, nausea, vomiting, or diaphoresis. In the ED she was noted to be hypokalemic with a Na of 129, troponin negative x 1, hgb 11.4 (around her baseline), and dehydrated with a SCr of 1.35 (baseline 0.9) and BUN of 20. CXR showed possible acute bronchitis. EKG showed sinus bradycardia, 51 bpm, poor R wave progression, nonspecific anterolateral st/t changes. It was felt she fell asleep as she had recently just started clonidine, which is known to cause drowsiness. She was given IV fluids for her AKI and hyponatremia. She felt well  and was discharged.   Recent TSH normal in July 2017.   She comes in stating she has not taken any of her medications since Sunday morning prior to her above possible syncopal episode. BP at home has been running in the [id number] to [id number] systolic range. No associated chest pain, SOB, vision changes, diaphoresis, nausea, or vomiting. BP this morning at home [id number] systolic. She is nervous at her office visit.   She tells me on Sunday, 10/22 she was sitting on swing on her porch. She was in her usual state of health. Her son just happened to walk around the corner and called his mom's name. She opened her eyes and states "I must have passed out." She denied any chest pain, SOB, diaphoresis, or palpitations. She was in her usual state of health upon opening her eyes. She was brought to the ED for the above with the above workup and findings.    Past Medical History:  Diagnosis Date  . Allergic rhinitis   . Anemia   . Anxiety   . GERD (gastroesophageal reflux disease)   . Hyperlipidemia   . Hypertension   . Osteopenia   . Osteoporosis     Past Surgical History:  Procedure Laterality Date  . APPENDECTOMY    . BREAST SURGERY  10/10   benign biopsy  negative  . CESAREAN SECTION    . CHOLECYSTECTOMY    . FRACTURE SURGERY  07/2008   neck  . OVARIAN CYST SURGERY  Current Outpatient Prescriptions  Medication Sig Dispense Refill  . aspirin (ASPIRIN EC) 81 MG EC tablet Take 81 mg by mouth daily. Swallow whole.    . calcium-vitamin D (OSCAL WITH D) 500-200 MG-UNIT per tablet Take 2 tablets by mouth daily.      . Cholecalciferol (VITAMIN D) 2000 UNITS CAPS Take 1 capsule by mouth daily.      . cloNIDine (CATAPRES) 0.1 MG tablet Take 1 tablet (0.1 mg total) by mouth 2 (two) times daily. 60 tablet 6  . DimenhyDRINATE (DRAMAMINE) 50 MG CHEW Chew 1 tablet by mouth as needed.      . fluticasone (FLONASE) 50 MCG/ACT nasal spray Place 2 sprays into both nostrils daily. 16 g 11  . hydrochlorothiazide  (HYDRODIURIL) 25 MG tablet Take 1 tablet (25 mg total) by mouth every other day. 30 tablet 6  . losartan (COZAAR) 100 MG tablet TAKE 1 TABLET BY MOUTH EVERY DAY 30 tablet 11  . meclizine (ANTIVERT) 25 MG tablet Take 1 tablet (25 mg total) by mouth 3 (three) times daily as needed for dizziness. 30 tablet 3  . omeprazole (PRILOSEC) 20 MG capsule TAKE 1 CAPSULE BY MOUTH EVERY DAY 30 capsule 11   No current facility-administered medications for this visit.     Allergies:   Buspirone hcl; Codeine; Hydrochlorothiazide; Lansoprazole; Minocycline hcl; Penicillins; Ramipril; and Sulfa antibiotics   Social History:  The patient  reports that she quit smoking about 47 years ago. She has never used smokeless tobacco. She reports that she does not drink alcohol or use drugs.   Family History:  The patient's family history includes Cancer in her maternal aunt; Diabetes in her maternal aunt; Heart disease in her father; Hypertension in her mother; Macular degeneration in her sister.  ROS:   Review of Systems  Constitutional: Positive for malaise/fatigue. Negative for chills, diaphoresis, fever and weight loss.  HENT: Negative for congestion.   Eyes: Negative for discharge and redness.  Respiratory: Negative for cough, hemoptysis, sputum production, shortness of breath and wheezing.   Cardiovascular: Negative for chest pain, palpitations, orthopnea, claudication, leg swelling and PND.  Gastrointestinal: Negative for abdominal pain, blood in stool, heartburn, melena, nausea and vomiting.  Genitourinary: Negative for hematuria.  Musculoskeletal: Negative for falls and myalgias.  Skin: Negative for rash.  Neurological: Negative for dizziness, tingling, tremors, sensory change, speech change, focal weakness, loss of consciousness and weakness.  Endo/Heme/Allergies: Does not bruise/bleed easily.  Psychiatric/Behavioral: Negative for substance abuse. The patient is nervous/anxious.   All other systems reviewed  and are negative.    PHYSICAL EXAM:  VS:  BP (!) 196/80 (BP Location: Left Arm, Patient Position: Sitting, Cuff Size: Normal)   Pulse (!) 59   Ht 5\' 1"  (1.549 m)   Wt 140 lb 4 oz (63.6 kg)   BMI 26.50 kg/m  BMI: Body mass index is 26.5 kg/m.  Physical Exam  Constitutional: She is oriented to person, place, and time. She appears well-developed and well-nourished.  HENT:  Head: Normocephalic and atraumatic.  Eyes: Right eye exhibits no discharge. Left eye exhibits no discharge.  Neck: Normal range of motion. No JVD present.  Cardiovascular: Normal rate, regular rhythm, S1 normal, S2 normal and normal heart sounds.  Exam reveals no distant heart sounds, no friction rub, no midsystolic click and no opening snap.   No murmur heard. Pulmonary/Chest: Effort normal and breath sounds normal. No respiratory distress. She has no decreased breath sounds. She has no wheezes. She has no rales. She exhibits  no tenderness.  Abdominal: Soft. She exhibits no distension. There is no tenderness.  Musculoskeletal: She exhibits no edema.  Neurological: She is alert and oriented to person, place, and time.  Skin: Skin is warm and dry. No cyanosis. Nails show no clubbing.  Psychiatric: She has a normal mood and affect. Her speech is normal and behavior is normal. Judgment and thought content normal.     EKG:  Was not ordered today.   Recent Labs: 10/09/2015: TSH 1.95 12/31/2015: ALT 14; BUN 20; Creatinine, Ser 1.35; Hemoglobin 11.4; Platelets 261; Potassium 3.8; Sodium 129  03/17/2015: Cholesterol 200; HDL 51.50; LDL Cholesterol 115; Total CHOL/HDL Ratio 4; Triglycerides 167.0; VLDL 33.4   Estimated Creatinine Clearance: 27.9 mL/min (by C-G formula based on SCr of 1.35 mg/dL (H)).   Wt Readings from Last 3 Encounters:  01/03/16 140 lb 4 oz (63.6 kg)  12/31/15 181 lb 12.8 oz (82.5 kg)  11/16/15 140 lb 8 oz (63.7 kg)     Other studies reviewed: Additional studies/records reviewed today include:  summarized above  ASSESSMENT AND PLAN:  1. Possible syncopal episode/somnolence vs sleeping: It is not certain at this time if the patient actually had a syncopal episode vs falling a sleep 2/2 having recently taken clonidine. Work up has been unrevealing to date. I offered her a 30-day event monitor, she declined stating she felt dehydrated and extremely tired on clonidine. Recent normal echo. Declines ischemic evaluation at this time. Consider carotid doppler ultrasound vs sleep study for possible sleep apnea vs neuro evaluation for possible narcolepsy if symptoms return.   2. Accelerated HTN: In the setting of self discontinuing all medications. When she was taking clonidine she was taking this only once daily and only took this medication for a few days before self stopping. She no longer wants to take clonidine because it made her very tired. She will go back on losartan 100 mg daily as well as HCTZ every other day. She will start Imdur 30 mg daily. She prefers to take once-a-day medications. In the future could consider hydralazine. I discussed this with her, but does not want to take a medication 3 times daily. BP was rechecked in the office with a SBP of 181 mmHg. She was advised to take her losartan and HCTZ in the office prior to going home.   3. Palpitations: No further events. Declines cardiac monitoring.   4. Chronic anemia: Stable in the ED. Defer to PCP.   5. AKI: Status post IV fluids in the ED. Check bmet.   6. Hyponatremia: Check bmet as above. Likely in the setting of her diuretic.   Disposition: F/u with Dr. Rockey Situ in 1 month.   Current medicines are reviewed at length with the patient today.  The patient did not have any concerns regarding medicines.  Melvern Banker PA-C 01/03/2016 3:23 PM     [facility] [address] [place], [place] [address] [phone]

## 2016-01-03 NOTE — Patient Instructions (Signed)
Medication Instructions:  Your physician has recommended you make the following change in your medication:  RESTART losartan 100mg  once daily START taking HCTZ 25mg  every other day START taking Imdur 30mg  once daily STOP taking clonidine  Labwork: BMET  Testing/Procedures: none  Follow-Up: Your physician recommends that you schedule a follow-up appointment in: one month with Christell Faith, PA or Dr. Rockey Situ.    Any Other Special Instructions Will Be Listed Below (If Applicable). Check your BP daily and call our office Monday, October 30 with the readings.    If you need a refill on your cardiac medications before your next appointment, please call your pharmacy.

## 2016-01-05 ENCOUNTER — Other Ambulatory Visit: Payer: Self-pay

## 2016-01-05 DIAGNOSIS — I10 Essential (primary) hypertension: Secondary | ICD-10-CM

## 2016-01-05 LAB — BASIC METABOLIC PANEL

## 2016-01-08 ENCOUNTER — Other Ambulatory Visit
Admission: RE | Admit: 2016-01-08 | Discharge: 2016-01-08 | Disposition: A | Discharge status: Home / Self Care | Payer: Medicare Other | Source: Ambulatory Visit | Attending: Physician Assistant | Admitting: Physician Assistant

## 2016-01-08 ENCOUNTER — Ambulatory Visit
Admission: RE | Admit: 2016-01-08 | Discharge: 2016-01-08 | Disposition: A | Discharge status: Home / Self Care | Payer: Medicare Other | Source: Ambulatory Visit

## 2016-01-08 ENCOUNTER — Telehealth: Payer: Self-pay | Admitting: Physician Assistant

## 2016-01-08 ENCOUNTER — Other Ambulatory Visit: Payer: Self-pay | Admitting: Family Medicine

## 2016-01-08 DIAGNOSIS — Z1231 Encounter for screening mammogram for malignant neoplasm of breast: Secondary | ICD-10-CM

## 2016-01-08 DIAGNOSIS — I10 Essential (primary) hypertension: Secondary | ICD-10-CM

## 2016-01-08 LAB — BASIC METABOLIC PANEL
Anion gap: 10 (ref 5–15)
BUN: 14 mg/dL (ref 6–20)
CO2: 24 mmol/L (ref 22–32)
Calcium: 9.5 mg/dL (ref 8.9–10.3)
Chloride: 92 mmol/L — ABNORMAL LOW (ref 101–111)
Creatinine, Ser: 0.89 mg/dL (ref 0.44–1.00)
GFR calc Af Amer: >60 mL/min (ref >60)
GFR calc non Af Amer: 59 mL/min — ABNORMAL LOW (ref >60)
Glucose, Bld: 120 mg/dL — ABNORMAL HIGH (ref 65–99)
Potassium: 3.9 mmol/L (ref 3.5–5.1)
Sodium: 126 mmol/L — ABNORMAL LOW (ref 135–145)

## 2016-01-08 MED ORDER — ISOSORBIDE MONONITRATE ER 30 MG PO TB24: 60.0000 mg | ORAL_TABLET | Freq: Every day | ORAL | 3 refills | Status: DC

## 2016-01-08 NOTE — Telephone Encounter (Signed)
Spoke w/ pt.  Advised her of Ryan's recommendation.  She verbalizes understanding and is agreeable.  Asked her to call back if readings do not improve.  She is appreciative of the call.

## 2016-01-08 NOTE — Telephone Encounter (Signed)
BP improving. Titrate Imdur to 60 mg daily. If BP continues to run > [id number] mmHg systolic after several days of being on 60 mg daily can increase to 90 mg daily.

## 2016-01-08 NOTE — Addendum Note (Signed)
Addended by: Dede Query R on: 01/08/2016 11:11 AM   Modules accepted: Orders

## 2016-01-08 NOTE — Telephone Encounter (Signed)
Pt was advised at last ov to call back w/ readings today.

## 2016-01-08 NOTE — Telephone Encounter (Signed)
Pt is calling back with BP readings: 10/26-147/66 HR 104 10/27-152/60 HR 77 10/28-143/65 HR 93 10/29-144/63 HR 84/ p.m. 131/64 HR 84 10/30-152/63 HR 77 Please call this afternoon, pt has another appt this morning.

## 2016-01-11 ENCOUNTER — Other Ambulatory Visit: Payer: Self-pay | Admitting: Family Medicine

## 2016-01-11 DIAGNOSIS — R928 Other abnormal and inconclusive findings on diagnostic imaging of breast: Secondary | ICD-10-CM

## 2016-01-14 ENCOUNTER — Emergency Department: Payer: Medicare Other

## 2016-01-14 ENCOUNTER — Encounter: Payer: Self-pay | Admitting: Emergency Medicine

## 2016-01-14 ENCOUNTER — Emergency Department
Admission: EM | Admit: 2016-01-14 | Discharge: 2016-01-14 | Disposition: A | Discharge status: Home / Self Care | Payer: Medicare Other | Attending: Emergency Medicine | Admitting: Emergency Medicine

## 2016-01-14 DIAGNOSIS — Z79899 Other long term (current) drug therapy: Secondary | ICD-10-CM | POA: Insufficient documentation

## 2016-01-14 DIAGNOSIS — N390 Urinary tract infection, site not specified: Secondary | ICD-10-CM | POA: Diagnosis not present

## 2016-01-14 DIAGNOSIS — I10 Essential (primary) hypertension: Secondary | ICD-10-CM | POA: Insufficient documentation

## 2016-01-14 DIAGNOSIS — G9389 Other specified disorders of brain: Secondary | ICD-10-CM | POA: Diagnosis not present

## 2016-01-14 DIAGNOSIS — Z87891 Personal history of nicotine dependence: Secondary | ICD-10-CM | POA: Insufficient documentation

## 2016-01-14 DIAGNOSIS — Z7982 Long term (current) use of aspirin: Secondary | ICD-10-CM | POA: Diagnosis not present

## 2016-01-14 DIAGNOSIS — R55 Syncope and collapse: Secondary | ICD-10-CM | POA: Diagnosis present

## 2016-01-14 DIAGNOSIS — R110 Nausea: Secondary | ICD-10-CM | POA: Diagnosis not present

## 2016-01-14 LAB — CBC WITH DIFFERENTIAL/PLATELET
Basophils Absolute: 0.2 10*3/uL — ABNORMAL HIGH (ref 0–0.1)
Basophils Relative: 2 %
Eosinophils Absolute: 0.2 10*3/uL (ref 0–0.7)
Eosinophils Relative: 3 %
HCT: 31.7 % — ABNORMAL LOW (ref 35.0–47.0)
Hemoglobin: 11 g/dL — ABNORMAL LOW (ref 12.0–16.0)
Lymphocytes Relative: 42 %
Lymphs Abs: 3 10*3/uL (ref 1.0–3.6)
MCH: 27.3 pg (ref 26.0–34.0)
MCHC: 34.8 g/dL (ref 32.0–36.0)
MCV: 78.5 fL — ABNORMAL LOW (ref 80.0–100.0)
Monocytes Absolute: 0.6 10*3/uL (ref 0.2–0.9)
Monocytes Relative: 9 %
Neutro Abs: 3.2 10*3/uL (ref 1.4–6.5)
Neutrophils Relative %: 44 %
Platelets: 267 10*3/uL (ref 150–440)
RBC: 4.03 MIL/uL (ref 3.80–5.20)
RDW: 16.1 % — ABNORMAL HIGH (ref 11.5–14.5)
WBC: 7.2 10*3/uL (ref 3.6–11.0)

## 2016-01-14 LAB — URINALYSIS COMPLETE WITH MICROSCOPIC (ARMC ONLY)
Bilirubin Urine: NEGATIVE
Glucose, UA: NEGATIVE mg/dL
Hgb urine dipstick: NEGATIVE
Ketones, ur: NEGATIVE mg/dL
Nitrite: NEGATIVE
Protein, ur: NEGATIVE mg/dL
Specific Gravity, Urine: 1.012 (ref 1.005–1.030)
Trans Epithel, UA: <1
pH: 5 (ref 5.0–8.0)

## 2016-01-14 LAB — GLUCOSE, CAPILLARY: Glucose-Capillary: 115 mg/dL — ABNORMAL HIGH (ref 65–99)

## 2016-01-14 LAB — BASIC METABOLIC PANEL
Anion gap: 8 (ref 5–15)
BUN: 14 mg/dL (ref 6–20)
CO2: 24 mmol/L (ref 22–32)
Calcium: 8.9 mg/dL (ref 8.9–10.3)
Chloride: 101 mmol/L (ref 101–111)
Creatinine, Ser: 0.99 mg/dL (ref 0.44–1.00)
GFR calc Af Amer: 60 mL/min — ABNORMAL LOW (ref >60)
GFR calc non Af Amer: 52 mL/min — ABNORMAL LOW (ref >60)
Glucose, Bld: 114 mg/dL — ABNORMAL HIGH (ref 65–99)
Potassium: 3.6 mmol/L (ref 3.5–5.1)
Sodium: 133 mmol/L — ABNORMAL LOW (ref 135–145)

## 2016-01-14 LAB — TROPONIN I: Troponin I: <0.03 ng/mL (ref <0.03)

## 2016-01-14 MED ORDER — CEPHALEXIN 500 MG PO CAPS: 500.0000 mg | ORAL_CAPSULE | Freq: Three times a day (TID) | ORAL | 0 refills | Status: AC

## 2016-01-14 MED ORDER — CEPHALEXIN 500 MG PO CAPS
500.0000 mg | ORAL_CAPSULE | Freq: Once | ORAL | Status: AC
  Administered 2016-01-14: 500 mg via ORAL
  Filled 2016-01-14: qty 1

## 2016-01-14 MED ORDER — SODIUM CHLORIDE 0.9 % IV BOLUS (SEPSIS)
1000.0000 mL | Freq: Once | INTRAVENOUS | Status: AC
  Administered 2016-01-14: 1000 mL via INTRAVENOUS

## 2016-01-14 NOTE — ED Notes (Signed)

## 2016-01-14 NOTE — ED Provider Notes (Signed)
[facility] Emergency Department Provider Note  ____________________________________________   First MD Initiated Contact with Patient 01/14/16 1657     (approximate)  I have reviewed the triage vital signs and the nursing notes.   HISTORY  Chief Complaint Loss of Consciousness   HPI Jenna Young is a 82 y.o. female with a history of hypertension and possible recent episode of syncope was presenting to the emergency department after a near syncopal or syncopal episode. She states that she was standing up and making tea when she started C everything "go black." She also had some lightheadedness at that time. She was lowered to a chair by her family. The patient denies losing consciousness but the family says that she did. Unknown duration of loss of consciousness. The ambulance was called and then the patient vomited in route 1. She denies any nausea, chest pain or shortness of breath. Says that she feels back to normal at this time. She had a similar episode on October 22 when she was evaluated in the emergency department. She then followed up with her cardiologist and stopped clonidine but continued on her losartan as well as HCTZ. She says that she has not felt sick lately.  Says that she has been eating and drinking normally.  On arrival of EMS, the patient had a systolic blood pressure of 92.  Past Medical History:  Diagnosis Date  . Allergic rhinitis   . Anemia   . Anxiety   . GERD (gastroesophageal reflux disease)   . Hyperlipidemia   . Hypertension   . Osteopenia   . Osteoporosis     Patient Active Problem List   Diagnosis Date Noted  . Varicose vein of leg 10/09/2015  . Fatigue 10/09/2015  . Pedal edema 10/09/2015  . Heart murmur 10/09/2015  . Spinal compression fracture ([place]) 05/07/2015  . Lumbar pain 05/03/2015  . Lipoma of face 11/08/2014  . Abnormal urinalysis 09/20/2013  . Back contusion 09/20/2013  . Dysthymia 09/20/2013  .  Encounter for Medicare annual wellness exam 02/03/2013  . Vaso-vagal reaction 09/25/2012  . BPPV (benign paroxysmal positional vertigo) 06/15/2012  . Chronic right ear pain 03/16/2012  . Colon cancer screening 01/17/2012  . Anemia 01/17/2012  . Vertigo 11/25/2011  . GRIEF REACTION 08/01/2009  . Vitamin D deficiency 08/19/2008  . ANXIETY 01/29/2008  . Essential hypertension, benign 09/25/2007  . B12 deficiency 09/24/2006  . Hyperlipidemia 09/24/2006  . Venous (peripheral) insufficiency 09/24/2006  . ALLERGIC RHINITIS 09/24/2006  . GERD 09/24/2006  . Osteoporosis 09/24/2006    Past Surgical History:  Procedure Laterality Date  . APPENDECTOMY    . BREAST SURGERY  10/10   benign biopsy  negative  . CESAREAN SECTION    . CHOLECYSTECTOMY    . FRACTURE SURGERY  07/2008   neck  . OVARIAN CYST SURGERY      Prior to Admission medications   Medication Sig Start Date End Date Taking? Authorizing Provider  aspirin (ASPIRIN EC) 81 MG EC tablet Take 81 mg by mouth daily. Swallow whole.   Yes Historical Provider, MD  aspirin 325 MG tablet Take 325 mg by mouth as needed.   Yes Historical Provider, MD  calcium-vitamin D (OSCAL WITH D) 500-200 MG-UNIT per tablet Take 2 tablets by mouth daily.     Yes Historical Provider, MD  Cholecalciferol (VITAMIN D) 2000 UNITS CAPS Take 1 capsule by mouth daily.     Yes Historical Provider, MD  hydrochlorothiazide (HYDRODIURIL) 25 MG tablet Take 1  tablet (25 mg total) by mouth every other day. 12/11/15 03/10/16 Yes Minna Merritts, MD  isosorbide mononitrate (IMDUR) 30 MG 24 hr tablet Take 2 tablets (60 mg total) by mouth daily. 01/08/16 04/07/16 Yes Ryan M Dunn, PA-C  losartan (COZAAR) 100 MG tablet TAKE 1 TABLET BY MOUTH EVERY DAY 05/30/15  Yes Abner Greenspan, MD  meclizine (ANTIVERT) 25 MG tablet Take 1 tablet (25 mg total) by mouth 3 (three) times daily as needed for dizziness. 12/15/13  Yes Abner Greenspan, MD  omeprazole (PRILOSEC) 20 MG capsule TAKE 1  CAPSULE BY MOUTH EVERY DAY 02/14/15  Yes Abner Greenspan, MD  fluticasone (FLONASE) 50 MCG/ACT nasal spray Place 2 sprays into both nostrils daily. 02/02/13 01/03/16  Abner Greenspan, MD    Allergies Buspirone hcl; Codeine; Hydrochlorothiazide; Lansoprazole; Minocycline hcl; Penicillins; Ramipril; and Sulfa antibiotics  Family History  Problem Relation Age of Onset  . Hypertension Mother   . Heart disease Father   . Macular degeneration Sister   . Diabetes Maternal Aunt   . Cancer Maternal Aunt     throat    Social History Social History  Substance Use Topics  . Smoking status: Former Smoker    Quit date: 03/11/1968  . Smokeless tobacco: Never Used  . Alcohol use No    Review of Systems Constitutional: No fever/chills Eyes: No visual changes. ENT: No sore throat. Cardiovascular: Denies chest pain. Respiratory: Denies shortness of breath. Gastrointestinal: No abdominal pain.   No diarrhea.  No constipation. Genitourinary: Negative for dysuria. Musculoskeletal: Negative for back pain. Skin: Negative for rash. Neurological: Negative for headaches, focal weakness or numbness.  10-point ROS otherwise negative.  ____________________________________________   PHYSICAL EXAM:  VITAL SIGNS: ED Triage Vitals  Enc Vitals Group     BP 01/14/16 1655 130/62     Pulse Rate 01/14/16 1655 78     Resp 01/14/16 1655 18     Temp 01/14/16 1655 97.7 F (36.5 C)     Temp Source 01/14/16 1655 Oral     SpO2 01/14/16 1655 97 %     Weight 01/14/16 1653 140 lb (63.5 kg)     Height 01/14/16 1653 5\' 1"  (1.549 m)     Head Circumference --      Peak Flow --      Pain Score 01/14/16 1654 0     Pain Loc --      Pain Edu? --      Excl. in [place]? --     Constitutional: Alert and oriented. Well appearing and in no acute distress. Eyes: Conjunctivae are normal. PERRL. EOMI. Head: Atraumatic. Nose: No congestion/rhinnorhea. Mouth/Throat: Mucous membranes are moist.   Neck: No stridor.     Cardiovascular: Normal rate, regular rhythm. Grossly normal heart sounds.  Good peripheral circulation With intact, equal and bilateral radial as well as dorsalis pedis pulses. Respiratory: Normal respiratory effort.  No retractions. Lungs CTAB. Gastrointestinal: Soft and nontender. No distention.  Musculoskeletal: No lower extremity tenderness nor edema.  No joint effusions. Neurologic:  Normal speech and language. No gross focal neurologic deficits are appreciated. Skin:  Skin is warm, dry and intact. No rash noted. Psychiatric: Mood and affect are normal. Speech and behavior are normal.  ____________________________________________   LABS (all labs ordered are listed, but only abnormal results are displayed)  Labs Reviewed  CBC WITH DIFFERENTIAL/PLATELET - Abnormal; Notable for the following:       Result Value   Hemoglobin 11.0 (*)  HCT 31.7 (*)    MCV 78.5 (*)    RDW 16.1 (*)    Basophils Absolute 0.2 (*)    All other components within normal limits  BASIC METABOLIC PANEL - Abnormal; Notable for the following:    Sodium 133 (*)    Glucose, Bld 114 (*)    GFR calc non Af Amer 52 (*)    GFR calc Af Amer 60 (*)    All other components within normal limits  GLUCOSE, CAPILLARY - Abnormal; Notable for the following:    Glucose-Capillary 115 (*)    All other components within normal limits  URINALYSIS COMPLETEWITH MICROSCOPIC ([facility] ONLY) - Abnormal; Notable for the following:    Color, Urine YELLOW (*)    APPearance HAZY (*)    Leukocytes, UA 3+ (*)    Bacteria, UA RARE (*)    Squamous Epithelial / LPF 6-30 (*)    All other components within normal limits  URINE CULTURE  TROPONIN I  CBG MONITORING, ED   ____________________________________________  EKG  ED ECG REPORT I, Doran Stabler, the attending physician, personally viewed and interpreted this ECG.   Date: 01/14/2016  EKG Time: 1653  Rate: 76  Rhythm: normal sinus rhythm  Axis: Normal   Intervals:none  ST&T Change: No ST segment elevation or depression. No abnormal T-wave inversion. LVH present. No significant change from EKG done on 12/31/2015. ____________________________________________  RADIOLOGY  CT Head Wo Contrast (Final result)  Result time 01/14/16 19:11:42  Final result by Jeannine Boga, MD (01/14/16 19:11:42)           Narrative:   CLINICAL DATA: Initial evaluation for possible seizure.  EXAM: CT HEAD WITHOUT CONTRAST  TECHNIQUE: Contiguous axial images were obtained from the base of the skull through the vertex without intravenous contrast.  COMPARISON: Prior CT from 10/22/2012.  FINDINGS: Brain: No acute intracranial hemorrhage. No evidence for acute large vessel territory infarct. No mass lesion, midline shift or mass effect. No hydrocephalus. No extra-axial fluid collection.  Vascular: No hyperdense vessel. Scattered vascular calcifications noted within the carotid siphons.  Skull: Scalp soft tissues demonstrate no acute abnormality. Calvarium intact.  Sinuses/Orbits: Globes and orbital soft tissues within normal limits. Moderate fluid layering within the left sphenoid sinus with evidence for chronic sinusitis. Paranasal sinuses are otherwise clear. No mastoid effusion.  IMPRESSION: 1. No acute intracranial process. 2. Mild age-related cerebral atrophy with chronic microvascular ischemic disease. 3. Acute on chronic left sphenoid sinusitis.   Electronically Signed By: Jeannine Boga M.D. On: 01/14/2016 19:11            DG Chest 1 View (Final result)  Result time 01/14/16 17:39:20  Final result by Gilford Silvius, MD (01/14/16 17:39:20)           Narrative:   CLINICAL DATA: Syncope  EXAM: CHEST 1 VIEW  COMPARISON: 12/31/2015  FINDINGS: Normal heart size and mediastinal contours. No acute infiltrate or edema. No effusion or pneumothorax. No acute osseous findings.  IMPRESSION: No  evidence of active disease.   Electronically Signed By: Monte Fantasia M.D. On: 01/14/2016 17:39            ____________________________________________   PROCEDURES  Procedure(s) performed:   Procedures  Critical Care performed:   ____________________________________________   INITIAL IMPRESSION / ASSESSMENT AND PLAN / ED COURSE  Pertinent labs & imaging results that were available during my care of the patient were reviewed by me and considered in my medical decision making (see chart for details).  -----------------------------------------  7:41 PM on 01/14/2016 -----------------------------------------  Patient resting comfortably at this time. Discussed the incident with the family at the bedside who say that the patient was having a glazed over look tonight and lost urinary continence. They said that she was in and out of consciousness for about 20 minutes tonight. Had some shaking activity of her upper extremities. In addition to the episode 2 weeks ago she also had a similar episode several years ago.  I had a detailed discussion with Dr. Harrington Challenger, the cardiologist on-call, who thinks that the patient should have a Holter monitor and orthostatic vital signs taken. If the patient does not have orthostasis than the recognition from Dr. Harrington Challenger is to continue on only 30 mg of Imdur. She may then follow-up in the office with a Holter monitor. I also discussed the case with Dr. Nicole Kindred, the on-call neurologist. We discussed the possibility of seizure versus syncope. Dr. Nicole Kindred thinks that she should follow up with neurology for an outpatient workup and does not think that the patient should be started on medication at this time. Patient also found to have UTI. Possible concerning factor.   Clinical Course     ----------------------------------------- 8:03 PM on 01/14/2016 -----------------------------------------  Patient without orthostatic changes on her vital signs.  Continues to feel at her baseline. We'll treat for UTI. Says that she had some facial numbness last time she had penicillin before without any swelling and rash. We'll give Keflex for her UTI.  I discussed the lab results as well as imaging results the family. They're also aware of the discussions with consultants. The patient will continue on 30 mg of Imdur only. She will not drive until she follows up with both a neurologist and the cardiologist and she is cleared to resume driving. She would like to go home at this time. We also discussed following up this week for a Holter monitor with the cardiologist. The patient also asked about following up tomorrow with a mammogram result appointment and I encouraged her to do so. The patient as well as the family understanding of the plan and willing to comply. Still unclear if this was a seizure versus syncopal episode and will require further workup to differentiate. However, the patient is stable and at her baseline and I feel is appropriate for outpatient treatment. ____________________________________________   FINAL CLINICAL IMPRESSION(S) / ED DIAGNOSES  Seizure versus syncope. UTI.    NEW MEDICATIONS STARTED DURING THIS VISIT:  New Prescriptions   No medications on file     Note:  This document was prepared using Dragon voice recognition software and may include unintentional dictation errors.    Orbie Pyo, MD 01/14/16 2006

## 2016-01-14 NOTE — ED Notes (Signed)
Pt returned from CT °

## 2016-01-14 NOTE — ED Triage Notes (Signed)
Pt presents to ED via [facility] from home. Pt states she was up making tea and felt her vision go dark and sat down. Family reports syncopal episodes. Has had changes to BP meds recently. BP [id number] per EMS, systolic BP increased to 0000000 after 521mL NS bolus. Pt had x1 emesis PTA. Pt diaphoretic and lethargic when EMS arrived at home, A&Ox4 upon arrival to ED. No c/o pain at this time.

## 2016-01-14 NOTE — ED Notes (Signed)
Patient transported to CT 

## 2016-01-15 ENCOUNTER — Ambulatory Visit
Admission: RE | Admit: 2016-01-15 | Discharge: 2016-01-15 | Disposition: A | Discharge status: Home / Self Care | Payer: Medicare Other | Source: Ambulatory Visit | Attending: Family Medicine | Admitting: Family Medicine

## 2016-01-15 ENCOUNTER — Other Ambulatory Visit: Payer: Self-pay | Admitting: Family Medicine

## 2016-01-15 DIAGNOSIS — R928 Other abnormal and inconclusive findings on diagnostic imaging of breast: Secondary | ICD-10-CM

## 2016-01-15 DIAGNOSIS — N6489 Other specified disorders of breast: Secondary | ICD-10-CM | POA: Diagnosis not present

## 2016-01-15 DIAGNOSIS — R921 Mammographic calcification found on diagnostic imaging of breast: Secondary | ICD-10-CM

## 2016-01-15 DIAGNOSIS — R920 Mammographic microcalcification found on diagnostic imaging of breast: Secondary | ICD-10-CM | POA: Diagnosis not present

## 2016-01-16 ENCOUNTER — Ambulatory Visit (INDEPENDENT_AMBULATORY_CARE_PROVIDER_SITE_OTHER): Payer: Medicare Other | Admitting: Cardiovascular Disease

## 2016-01-16 ENCOUNTER — Encounter: Payer: Self-pay | Admitting: Cardiovascular Disease

## 2016-01-16 VITALS — BP 168/72 | HR 74 | Ht 61.0 in | Wt 139.0 lb

## 2016-01-16 DIAGNOSIS — R55 Syncope and collapse: Secondary | ICD-10-CM | POA: Diagnosis not present

## 2016-01-16 DIAGNOSIS — R600 Localized edema: Secondary | ICD-10-CM

## 2016-01-16 DIAGNOSIS — E7800 Pure hypercholesterolemia, unspecified: Secondary | ICD-10-CM

## 2016-01-16 DIAGNOSIS — I10 Essential (primary) hypertension: Secondary | ICD-10-CM | POA: Diagnosis not present

## 2016-01-16 LAB — URINE CULTURE

## 2016-01-16 MED ORDER — AMLODIPINE BESYLATE 5 MG PO TABS: 5.0000 mg | ORAL_TABLET | Freq: Every day | ORAL | 3 refills | Status: DC

## 2016-01-16 NOTE — Progress Notes (Signed)
Cardiology Office Note  Date:  01/16/2016   ID:  Jenna Young, DOB [birth year], MRN [id number]  PCP:  Jenna Pardon, MD   Chief Complaint  Patient presents with  . other    follow up from [facility] ER. Meds reviewed by the pt. verbally. "doing well."     HPI:  Jenna Young is a pleasant 82 year old woman who initially presented with palpitations, murmur, leg edema, who follows up today for her hypertension, near syncope/syncope Prior history of vertigo  Was previously on amlodipine but wanted to change secondary to leg edema Started on bystolic Since her last clinic visit, we had phone calls of elevated blood pressure Started on HCTZ every other day  could not afford Bystolic long term   was changed to clonidine.  Presented to the emergency room October 22 with near syncope , dehydration  "Fell asleep in the sun " while she was on a swing set, was difficult to arouse, was taken inside by family, had large bowel movement   in the emergency room heart rate 62 bpm , sodium down to 129, creatinine elevated Notes indicate she was only taking clonidine once a day   Seen by our office on 01/03/2016  Blood pressure was 196/80 in the office, she was not taking any of her medications She declined a 30 day monitor, declined ischemic evaluation Restarted on losartan 100 mg daily, HCTZ every other day, Imdur 30 mg daily In follow-up blood pressures 140s up to 150 Recommended to increase Imdur up to 60 mg daily on October 30 On November 5, presented to the emergency room for near-syncope EMS noted systolic pressure 92. Emergency room low pressure 130/62 Orthostatics negative in the emergency room UTI was treated with keflex, Imdur decreased back to 30 mg daily On lab work she did not appear dehydrated, hematocrit 31.7 Subsequent lab work sodium was 126  In follow-up today, she reports that she feels well since hospital evaluation 2 days ago She has not been monitoring her blood pressure closely but  does report one measurement of [id number] systolic. Events of recent hospitalizations discussed with her in detail  Previous lab work reviewed total cholesterol 200, LDL 115 Hematocrit 32 Previous abdominal symptoms with iron pills, dizziness, possible vasovagal symptoms  EKG from the hospital reviewed   PMH:   has a past medical history of Allergic rhinitis; Anemia; Anxiety; GERD (gastroesophageal reflux disease); Hyperlipidemia; Hypertension; Osteopenia; and Osteoporosis.  PSH:    Past Surgical History:  Procedure Laterality Date  . APPENDECTOMY    . BREAST SURGERY  10/10   benign biopsy  negative  . CESAREAN SECTION    . CHOLECYSTECTOMY    . FRACTURE SURGERY  07/2008   neck  . OVARIAN CYST SURGERY      Current Outpatient Prescriptions  Medication Sig Dispense Refill  . aspirin (ASPIRIN EC) 81 MG EC tablet Take 81 mg by mouth daily. Swallow whole.    Marland Kitchen aspirin 325 MG tablet Take 325 mg by mouth as needed.    . calcium-vitamin D (OSCAL WITH D) 500-200 MG-UNIT per tablet Take 2 tablets by mouth daily.      . cephALEXin (KEFLEX) 500 MG capsule Take 1 capsule (500 mg total) by mouth 3 (three) times daily. 21 capsule 0  . Cholecalciferol (VITAMIN D) 2000 UNITS CAPS Take 1 capsule by mouth daily.      . fluticasone (FLONASE) 50 MCG/ACT nasal spray Place 2 sprays into both nostrils daily. 16 g 11  . isosorbide mononitrate (  IMDUR) 30 MG 24 hr tablet Take 30 mg by mouth daily.    Marland Kitchen losartan (COZAAR) 100 MG tablet TAKE 1 TABLET BY MOUTH EVERY DAY 30 tablet 11  . meclizine (ANTIVERT) 25 MG tablet Take 1 tablet (25 mg total) by mouth 3 (three) times daily as needed for dizziness. 30 tablet 3  . omeprazole (PRILOSEC) 20 MG capsule TAKE 1 CAPSULE BY MOUTH EVERY DAY 30 capsule 11  . amLODipine (NORVASC) 5 MG tablet Take 1 tablet (5 mg total) by mouth daily. 90 tablet 3   No current facility-administered medications for this visit.      Allergies:   Buspirone hcl; Codeine; Hydrochlorothiazide;  Lansoprazole; Minocycline hcl; Penicillins; Ramipril; and Sulfa antibiotics   Social History:  The patient  reports that she quit smoking about 47 years ago. She has never used smokeless tobacco. She reports that she does not drink alcohol or use drugs.   Family History:   family history includes Cancer in her maternal aunt; Diabetes in her maternal aunt; Heart disease in her father; Hypertension in her mother; Macular degeneration in her sister.    Review of Systems: Review of Systems  Constitutional: Negative.   Respiratory: Negative.   Cardiovascular: Negative.   Gastrointestinal: Negative.   Musculoskeletal: Negative.   Neurological: Positive for dizziness and loss of consciousness.  Psychiatric/Behavioral: Negative.   All other systems reviewed and are negative.    PHYSICAL EXAM: VS:  BP (!) 168/72 (BP Location: Left Arm, Patient Position: Sitting, Cuff Size: Normal)   Pulse 74   Ht 5\' 1"  (1.549 m)   Wt 139 lb (63 kg)   BMI 26.26 kg/m  , BMI Body mass index is 26.26 kg/m. GEN: Well nourished, well developed, in no acute distress  HEENT: normal  Neck: no JVD, carotid bruits, or masses Cardiac: RRR; no murmurs, rubs, or gallops,no edema  Respiratory:  clear to auscultation bilaterally, normal work of breathing GI: soft, nontender, nondistended, + BS MS: no deformity or atrophy  Skin: warm and dry, no rash Neuro:  Strength and sensation are intact Psych: euthymic mood, full affect    Recent Labs: 10/09/2015: TSH 1.95 12/31/2015: ALT 14 01/14/2016: BUN 14; Creatinine, Ser 0.99; Hemoglobin 11.0; Platelets 267; Potassium 3.6; Sodium 133    Lipid Panel Lab Results  Component Value Date   CHOL 200 03/17/2015   HDL 51.50 03/17/2015   LDLCALC 115 (H) 03/17/2015   TRIG 167.0 (H) 03/17/2015      Wt Readings from Last 3 Encounters:  01/16/16 139 lb (63 kg)  01/14/16 140 lb (63.5 kg)  01/03/16 140 lb 4 oz (63.6 kg)       ASSESSMENT AND PLAN:  Pure  hypercholesterolemia - Plan: Cardiac event monitor  Essential hypertension, benign -  Long discussion concerning her blood pressure, recent medications We have recommended that she hold the HCTZ given drop in her sodium, and given dehydration on first hospital visit.  Recommended she monitor blood pressure on losartan and isosorbide 30 mg daily -If blood pressure runs high, more the him [id number] systolic on a regular basis, we have recommended she add amlodipine 5 mg (could potentially use 2.5 mg if blood pressure borderline elevated ). We will try to avoid 10 mg dosing given previous leg swelling . We will avoid clonidine given previous bradycardia   avoid HCTZ given drop in sodium, episode of dehydration We will avoid higher dose isosorbide, seems to tolerate 30 mg daily  Pedal edema - Plan: Cardiac event monitor  the leg edema has essentially resolved , minimal edema likely from venous insufficiency   Syncope, unspecified syncope type - Plan: Cardiac event monitor, CANCELED: EKG 12-Lead Recent episode of near syncope or syncope concerning for hypotension unable to exclude arrhythmia given previous symptoms of palpitations, pausing   she's had 2 episodes of near syncope/syncope   30 day monitor has been ordered   Total encounter time more than 40 minutes  Greater than 50% was spent in counseling and coordination of care with the patient   Disposition:   F/U  6 weeks   Orders Placed This Encounter  Procedures  . Cardiac event monitor     Signed, Esmond Plants, M.D., Ph.D. 01/16/2016  [place], [place]

## 2016-01-16 NOTE — Patient Instructions (Addendum)
Medication Instructions:   Please stop the hydrochlorothiazide (HCTZ)  If blood pressure runs high, >150 to 160 Consider start amlodipine 2.5 up to 5 mg  Labwork:  No new labs needed  Testing/Procedures:  We will order a 30 day monitor for syncope, palpitations Your physician has recommended that you wear an event monitor. Event monitors are medical devices that record the heart's electrical activity. Doctors most often [place] these monitors to diagnose arrhythmias. Arrhythmias are problems with the speed or rhythm of the heartbeat. The monitor is a small, portable device. You can wear one while you do your normal daily activities. This is usually used to diagnose what is causing palpitations/syncope (passing out).  You will receive a call from [employer] to verify your address. It is very important that you answer this call.   Follow-Up: It was a pleasure seeing you in the office today. Please call us if you have new issues that need to be addressed before your next appt.  [phone]  Your physician wants you to follow-up in: 6 weeks   If you need a refill on your cardiac medications before your next appointment, please call your pharmacy.     Cardiac Event Monitoring A cardiac event monitor is a small recording device used to help detect abnormal heart rhythms (arrhythmias). The monitor is used to record heart rhythm when noticeable symptoms such as the following occur:  Fast heartbeats (palpitations), such as heart racing or fluttering.  Dizziness.  Fainting or light-headedness.  Unexplained weakness. The monitor is wired to two electrodes placed on your chest. Electrodes are flat, sticky disks that attach to your skin. The monitor can be worn for up to 30 days. You will wear the monitor at all times, except when bathing.  HOW TO USE YOUR CARDIAC EVENT MONITOR A technician will prepare your chest for the electrode placement. The technician will show you how to place the  electrodes, how to work the monitor, and how to replace the batteries. Take time to practice using the monitor before you leave the office. Make sure you understand how to send the information from the monitor to your health care provider. This requires a telephone with a landline, not a cell phone. You need to:  Wear your monitor at all times, except when you are in water:  Do not get the monitor wet.  Take the monitor off when bathing. Do not swim or use a hot tub with it on.  Keep your skin clean. Do not put body lotion or moisturizer on your chest.  Change the electrodes daily or any time they stop sticking to your skin. You might need to use tape to keep them on.  It is possible that your skin under the electrodes could become irritated. To keep this from happening, try to put the electrodes in slightly different places on your chest. However, they must remain in the area under your left breast and in the upper right section of your chest.  Make sure the monitor is safely clipped to your clothing or in a location close to your body that your health care provider recommends.  Press the button to record when you feel symptoms of heart trouble, such as dizziness, weakness, light-headedness, palpitations, thumping, shortness of breath, unexplained weakness, or a fluttering or racing heart. The monitor is always on and records what happened slightly before you pressed the button, so do not worry about being too late to get good information.  Keep a diary of your activities,  such as walking, doing chores, and taking medicine. It is especially important to note what you were doing when you pushed the button to record your symptoms. This will help your health care provider determine what might be contributing to your symptoms. The information stored in your monitor will be reviewed by your health care provider alongside your diary entries.  Send the recorded information as recommended by your health  care provider. It is important to understand that it will take some time for your health care provider to process the results.  Change the batteries as recommended by your health care provider. SEEK IMMEDIATE MEDICAL CARE IF:   You have chest pain.  You have extreme difficulty breathing or shortness of breath.  You develop a very fast heartbeat that persists.  You develop dizziness that does not go away.  You faint or constantly feel you are about to faint.   This information is not intended to replace advice given to you by your health care provider. Make sure you discuss any questions you have with your health care provider.   Document Released: 12/05/2007 Document Revised: 03/18/2014 Document Reviewed: 08/24/2012 Elsevier Interactive Patient Education [employer].

## 2016-01-19 NOTE — Telephone Encounter (Signed)
Spoke with patient and she states that she has not received her monitor yet. She reported that they did call her on Wednesday to confirm her address and let her know that she should get it either today or tomorrow. Let her know that we sent them a message to make sure it is on its way. Let her know that blood pressure readings looked better and to continue monitoring. She was appreciative for the call and had no further questions at this time.

## 2016-01-19 NOTE — Telephone Encounter (Signed)
Blood pressure readings   01/17/16  Wed    10 am  144/70  hr 87   4 pm   130/67   hr 81  01/18/16 thurs    815 am  131/68   Hr 93   415 pm 139/74  Hr 76  01/19/16 fri       10 am    123/68  hr 100 (changing bed sheets)    410 pm   147/68   Hr 89     Patient has not received monitor from [employer] yet

## 2016-01-20 ENCOUNTER — Encounter (INDEPENDENT_AMBULATORY_CARE_PROVIDER_SITE_OTHER): Payer: Medicare Other

## 2016-01-20 DIAGNOSIS — I10 Essential (primary) hypertension: Secondary | ICD-10-CM | POA: Diagnosis not present

## 2016-01-20 DIAGNOSIS — R002 Palpitations: Secondary | ICD-10-CM | POA: Diagnosis not present

## 2016-01-20 DIAGNOSIS — R600 Localized edema: Secondary | ICD-10-CM | POA: Diagnosis not present

## 2016-01-20 DIAGNOSIS — R55 Syncope and collapse: Secondary | ICD-10-CM | POA: Diagnosis not present

## 2016-01-20 DIAGNOSIS — E7800 Pure hypercholesterolemia, unspecified: Secondary | ICD-10-CM | POA: Diagnosis not present

## 2016-01-26 ENCOUNTER — Ambulatory Visit
Admission: RE | Admit: 2016-01-26 | Discharge: 2016-01-26 | Disposition: A | Discharge status: Home / Self Care | Payer: Medicare Other | Source: Ambulatory Visit | Attending: Family Medicine | Admitting: Family Medicine

## 2016-01-26 ENCOUNTER — Other Ambulatory Visit: Payer: Self-pay | Admitting: Family Medicine

## 2016-01-26 DIAGNOSIS — R921 Mammographic calcification found on diagnostic imaging of breast: Secondary | ICD-10-CM

## 2016-01-26 DIAGNOSIS — D0512 Intraductal carcinoma in situ of left breast: Secondary | ICD-10-CM | POA: Diagnosis not present

## 2016-01-26 HISTORY — PX: BREAST BIOPSY: SHX20

## 2016-02-05 ENCOUNTER — Ambulatory Visit: Payer: Medicare Other | Admitting: Physician Assistant

## 2016-02-12 ENCOUNTER — Ambulatory Visit: Payer: Self-pay | Admitting: General Surgery

## 2016-02-12 DIAGNOSIS — D0512 Intraductal carcinoma in situ of left breast: Secondary | ICD-10-CM

## 2016-02-13 ENCOUNTER — Telehealth: Payer: Self-pay | Admitting: Cardiovascular Disease

## 2016-02-13 NOTE — Telephone Encounter (Signed)
Received cardiac clearance request for pt to proceed w/ breat lumpectomy surgery in the near future under general anesthesia. The pt does not have a surgery date scheduled yet b/c she will require written clearance in order to proceed.  Dx: ductal carcinoma in situ of left breast. Please route clearance to [facility], Attn: Carlene Coria CMA @ [phone].

## 2016-02-14 NOTE — Telephone Encounter (Signed)
Acceptable risk for surgery No further testing needed 

## 2016-02-14 NOTE — Telephone Encounter (Signed)
Cardiac clearance routed through Epic to [phone]

## 2016-02-15 ENCOUNTER — Telehealth: Payer: Self-pay | Admitting: Hematology

## 2016-02-15 NOTE — Telephone Encounter (Signed)
Appt scheduled w/Feng on 12/18 at 11am. Pt aware to arrive 30 minutes early. Demographics verified.

## 2016-02-16 ENCOUNTER — Ambulatory Visit (INDEPENDENT_AMBULATORY_CARE_PROVIDER_SITE_OTHER): Payer: Medicare Other | Admitting: Cardiovascular Disease

## 2016-02-16 ENCOUNTER — Encounter: Payer: Self-pay | Admitting: Cardiovascular Disease

## 2016-02-16 VITALS — BP 140/68 | HR 98 | Ht 61.0 in | Wt 138.0 lb

## 2016-02-16 DIAGNOSIS — R002 Palpitations: Secondary | ICD-10-CM | POA: Diagnosis not present

## 2016-02-16 DIAGNOSIS — Z01810 Encounter for preprocedural cardiovascular examination: Secondary | ICD-10-CM

## 2016-02-16 DIAGNOSIS — E7800 Pure hypercholesterolemia, unspecified: Secondary | ICD-10-CM | POA: Diagnosis not present

## 2016-02-16 DIAGNOSIS — D0512 Intraductal carcinoma in situ of left breast: Secondary | ICD-10-CM

## 2016-02-16 DIAGNOSIS — I10 Essential (primary) hypertension: Secondary | ICD-10-CM | POA: Diagnosis not present

## 2016-02-16 NOTE — Progress Notes (Addendum)
Cardiology Office Note  Date:  02/16/2016   ID:  MARDEL MARSICO, DOB 07/10/1931, MRN [id number]  PCP:  Loura Pardon, MD   Chief Complaint  Patient presents with  . other    6 week follow up, pt. still wearing cardiac event monitor. Needs pre op for a breast lumpectomy; not scheduled yet.  Meds reviewed by the pt. verbally. "doing well."     HPI:  Ms. Amborn is a pleasant 82 year old woman who initially presented with palpitations, murmur, leg edema, who follows up today for her hypertension, near syncope/syncope Prior history of vertigo  She reports recent diagnosis of DCIS, had a biopsy bx, needs lumpectomy left side Procedure scheduled to be performed by Dr.Paul  Marlou Starks, [place] surgery  Otherwise she reports that she feels well No chest pain, no shortness of breath Blood pressure stable, no leg edema, no near syncope/lightheadedness Scheduled to finish wearing 30 day monitor this Sunday Events reviewed off the event monitor for the past month, no significant arrhythmia noted  EKG on 01/14/2016 shows normal sinus rhythm rate 76 bpm, LVH EKG on today's visit shows normal sinus rhythm rate 98 bpm, no significant ST or T-wave changes  Other past medical history reviewed  previously on amlodipine but wanted to change secondary to leg edema Started on bystolic Blood pressure continue to run high Started on HCTZ every other day  could not afford Bystolic long term   was changed to clonidine.  Presented to the emergency room December 31 2015 with near syncope , dehydration  "Fell asleep in the sun " while she was on a swing set, was difficult to arouse, was taken inside by family, had large bowel movement   in the emergency room heart rate 62 bpm , sodium down to 129, creatinine elevated Notes indicate she was only taking clonidine once a day   Seen by our office on 01/03/2016  Blood pressure was 196/80 in the office, she was not taking any of her medications She declined a 30 day  monitor, declined ischemic evaluation Restarted on losartan 100 mg daily, HCTZ every other day, Imdur 30 mg daily In follow-up blood pressures 140s up to 150 Recommended to increase Imdur up to 60 mg daily on January 08 2016 On November 5, presented to the emergency room for near-syncope EMS noted systolic pressure 92. Emergency room low pressure 130/62 Orthostatics negative in the emergency room UTI was treated with keflex, Imdur decreased back to 30 mg daily On lab work she did not appear dehydrated, hematocrit 31.7 Subsequent lab work sodium was 126 On her last clinic visit HCTZ held, amlodipine held, continued on losartan 100 daily and Imdur 30 daily  Previous lab work reviewed total cholesterol 200, LDL 115 Hematocrit 32 Previous abdominal symptoms with iron pills, dizziness, possible vasovagal symptoms   PMH:   has a past medical history of Allergic rhinitis; Anemia; Anxiety; GERD (gastroesophageal reflux disease); Hyperlipidemia; Hypertension; Osteopenia; and Osteoporosis.  PSH:    Past Surgical History:  Procedure Laterality Date  . APPENDECTOMY    . BREAST SURGERY  10/10   benign biopsy  negative  . CESAREAN SECTION    . CHOLECYSTECTOMY    . FRACTURE SURGERY  07/2008   neck  . OVARIAN CYST SURGERY      Current Outpatient Prescriptions  Medication Sig Dispense Refill  . amLODipine (NORVASC) 5 MG tablet Take 1 tablet (5 mg total) by mouth daily as needed (only for high bnlood pressure). 90 tablet 3  . aspirin (  ASPIRIN EC) 81 MG EC tablet Take 81 mg by mouth daily. Swallow whole.    . calcium-vitamin D (OSCAL WITH D) 500-200 MG-UNIT per tablet Take 2 tablets by mouth daily.      . Cholecalciferol (VITAMIN D) 2000 UNITS CAPS Take 1 capsule by mouth daily.      . fluticasone (FLONASE) 50 MCG/ACT nasal spray Place 2 sprays into both nostrils daily. 16 g 11  . isosorbide mononitrate (IMDUR) 30 MG 24 hr tablet Take 30 mg by mouth daily.    Marland Kitchen losartan (COZAAR) 100 MG tablet  TAKE 1 TABLET BY MOUTH EVERY DAY 30 tablet 11  . meclizine (ANTIVERT) 25 MG tablet Take 1 tablet (25 mg total) by mouth 3 (three) times daily as needed for dizziness. 30 tablet 3  . omeprazole (PRILOSEC) 20 MG capsule TAKE 1 CAPSULE BY MOUTH EVERY DAY 30 capsule 11   No current facility-administered medications for this visit.      Allergies:   Buspirone hcl; Codeine; Hydrochlorothiazide; Lansoprazole; Minocycline hcl; Penicillins; Ramipril; and Sulfa antibiotics   Social History:  The patient  reports that she quit smoking about 47 years ago. She has never used smokeless tobacco. She reports that she does not drink alcohol or use drugs.   Family History:   family history includes Cancer in her maternal aunt; Diabetes in her maternal aunt; Heart disease in her father; Hypertension in her mother; Macular degeneration in her sister.    Review of Systems: Review of Systems  Constitutional: Negative.   Respiratory: Negative.   Cardiovascular: Negative.   Gastrointestinal: Negative.   Musculoskeletal: Negative.   Neurological: Negative.   Psychiatric/Behavioral: Negative.   All other systems reviewed and are negative.    PHYSICAL EXAM: VS:  BP 140/68 (BP Location: Left Arm, Patient Position: Sitting, Cuff Size: Normal)   Pulse 98   Ht 5\' 1"  (1.549 m)   Wt 138 lb (62.6 kg)   BMI 26.07 kg/m  , BMI Body mass index is 26.07 kg/m. GEN: Well nourished, well developed, in no acute distress  HEENT: normal  Neck: no JVD, carotid bruits, or masses Cardiac: RRR; no murmurs, rubs, or gallops,no edema  Respiratory:  clear to auscultation bilaterally, normal work of breathing GI: soft, nontender, nondistended, + BS MS: no deformity or atrophy  Skin: warm and dry, no rash Neuro:  Strength and sensation are intact Psych: euthymic mood, full affect    Recent Labs: 10/09/2015: TSH 1.95 12/31/2015: ALT 14 01/14/2016: BUN 14; Creatinine, Ser 0.99; Hemoglobin 11.0; Platelets 267; Potassium  3.6; Sodium 133    Lipid Panel Lab Results  Component Value Date   CHOL 200 03/17/2015   HDL 51.50 03/17/2015   LDLCALC 115 (H) 03/17/2015   TRIG 167.0 (H) 03/17/2015      Wt Readings from Last 3 Encounters:  02/16/16 138 lb (62.6 kg)  01/16/16 139 lb (63 kg)  01/14/16 140 lb (63.5 kg)       ASSESSMENT AND PLAN:  Essential hypertension, benign - Plan: EKG 12-Lead Blood pressure is well controlled on today's visit. No changes made to the medications. Currently not taking amlodipine Only on losartan, low-dose Imdur  Palpitations - Plan: EKG 12-Lead Monitor reviewed, no significant arrhythmia  Pre-operative cardiovascular examination - Plan: EKG 12-Lead Acceptable risk for upcoming lumpectomy on left No further testing needed  Pure hypercholesterolemia  Ductal carcinoma in situ (DCIS) of left breast   Total encounter time more than 25 minutes  Greater than 50% was spent in counseling  and coordination of care with the patient   Disposition:   F/U  6 months   Orders Placed This Encounter  Procedures  . EKG 12-Lead     Signed, Esmond Plants, M.D., Ph.D. 02/16/2016  [place], [place]

## 2016-02-16 NOTE — Patient Instructions (Signed)

## 2016-02-17 ENCOUNTER — Other Ambulatory Visit: Payer: Self-pay | Admitting: Family Medicine

## 2016-02-19 ENCOUNTER — Encounter: Payer: Self-pay | Admitting: Radiation Oncology

## 2016-02-19 ENCOUNTER — Encounter: Payer: Self-pay | Admitting: Hematology

## 2016-02-21 ENCOUNTER — Encounter (HOSPITAL_BASED_OUTPATIENT_CLINIC_OR_DEPARTMENT_OTHER): Payer: Self-pay | Admitting: *Deleted

## 2016-02-22 ENCOUNTER — Other Ambulatory Visit: Payer: Self-pay | Admitting: General Surgery

## 2016-02-22 DIAGNOSIS — D0512 Intraductal carcinoma in situ of left breast: Secondary | ICD-10-CM

## 2016-02-23 ENCOUNTER — Ambulatory Visit
Admission: RE | Admit: 2016-02-23 | Discharge: 2016-02-23 | Disposition: A | Discharge status: Home / Self Care | Payer: Medicare Other | Source: Ambulatory Visit | Attending: General Surgery | Admitting: General Surgery

## 2016-02-23 DIAGNOSIS — D0512 Intraductal carcinoma in situ of left breast: Secondary | ICD-10-CM

## 2016-02-23 DIAGNOSIS — R928 Other abnormal and inconclusive findings on diagnostic imaging of breast: Secondary | ICD-10-CM | POA: Diagnosis not present

## 2016-02-23 NOTE — Progress Notes (Signed)
Pt instructed to drink Boost by 0600 day of surgery with teach back method.

## 2016-02-26 ENCOUNTER — Encounter: Payer: Self-pay | Admitting: Hematology

## 2016-02-26 ENCOUNTER — Ambulatory Visit (HOSPITAL_BASED_OUTPATIENT_CLINIC_OR_DEPARTMENT_OTHER): Payer: Medicare Other | Admitting: Hematology

## 2016-02-26 DIAGNOSIS — D0512 Intraductal carcinoma in situ of left breast: Secondary | ICD-10-CM

## 2016-02-26 DIAGNOSIS — Z853 Personal history of malignant neoplasm of breast: Secondary | ICD-10-CM | POA: Insufficient documentation

## 2016-02-26 DIAGNOSIS — Z171 Estrogen receptor negative status [ER-]: Secondary | ICD-10-CM

## 2016-02-26 NOTE — Progress Notes (Signed)
[place]  [phone] Fax:([phone] [place] Note   Patient Care Team: Abner Greenspan, MD as PCP - General Minna Merritts, MD as Consulting Physician (Cardiology) 02/26/2016  Referring physician: Dr. Marlou Starks  CHIEF COMPLAINTS/PURPOSE OF CONSULTATION:  Newly diagnosed left breast DCIS  Oncology History   Ductal carcinoma in situ (DCIS) of left breast   Staging form: Breast, AJCC 7th Edition   - Clinical stage from 01/26/2016: Stage 0 (Tis (DCIS), N0, M0) - Signed by Truitt Merle, MD on 02/26/2016      Ductal carcinoma in situ (DCIS) of left breast   01/15/2016 Mammogram    Diagnostic mammogram showed a persistent focal asymmetry in the left upper central breast, with associated indeterminatd microcalcifications. The area measures 1.5 x 1.3 x 0.9 cm. Ultrasound was negative for left axillary lymphadenopathy.      01/26/2016 Initial Biopsy    Left breast upper core needle biopsy showed DCIS with necrosis and calcification, intermediate grade.      01/26/2016 Receptors her2    ER negative, PR negative       01/26/2016 Initial Diagnosis    Ductal carcinoma in situ (DCIS) of left breast        HISTORY OF PRESENTING ILLNESS:  Jenna Young 82 y.o. female is here because of Her newly diagnosed left breast DCIS. She is accompanied by her granddaughter to my clinic today.  This is discovered by screening mammogram. She previously had a bilateral breast surgery for benign breast lesion (intraductal papilloma of left breast in 2010). Her screening mammogram in November 2016 showed focal asymmetry in the left upper central breast, with associated microcalcifications. The area measured 1.5 cm. She underwent ultrasound-guided biopsy, which showed DCIS, ER/PR negative, intermediate grade. She was seen by breast surgeon Dr. Marlou Starks, and is scheduled to have lumpectomy tomorrow.  She is widowed, lives alone and very independent. She denies any  pain, or other discomfort. She has good appetite and Level. She remains to be physically active, still drives. Her children and granddaughter live close to her. She has one son and one daughter, one granddaughter. No family history of breast or ovary cancer.  MEDICAL HISTORY:  Past Medical History:  Diagnosis Date  . Allergic rhinitis   . Anemia   . Anxiety   . Cancer [facility]) 02/2016   Left breast DCIS  . GERD (gastroesophageal reflux disease)   . Hyperlipidemia   . Hypertension    treated by Dr Thurnell Garbe recently with syncope episode  . Osteopenia   . Osteoporosis     SURGICAL HISTORY: Past Surgical History:  Procedure Laterality Date  . APPENDECTOMY    . BREAST SURGERY  10/10   benign biopsy  negative  . CESAREAN SECTION    . CHOLECYSTECTOMY    . FRACTURE SURGERY  07/2008   neck  . OVARIAN CYST SURGERY      SOCIAL HISTORY: Social History   Social History  . Marital status: Widowed    Spouse name: N/A  . Number of children: 1  . Years of education: N/A   Occupational History  .  Retired   Social History Main Topics  . Smoking status: Former Smoker    Quit date: 03/11/1968  . Smokeless tobacco: Never Used  . Alcohol use No  . Drug use: No  . Sexual activity: Not on file   Other Topics Concern  . Not on file   Social History Narrative  . No narrative on  file    FAMILY HISTORY: Family History  Problem Relation Age of Onset  . Hypertension Mother   . Heart disease Father   . Macular degeneration Sister   . Diabetes Maternal Aunt   . Cancer Maternal Aunt     throat    ALLERGIES:  is allergic to buspirone hcl; codeine; hydrochlorothiazide; lansoprazole; minocycline hcl; penicillins; ramipril; and sulfa antibiotics.  MEDICATIONS:  Current Outpatient Prescriptions  Medication Sig Dispense Refill  . amLODipine (NORVASC) 5 MG tablet Take 1 tablet (5 mg total) by mouth daily as needed (only for high bnlood pressure). 90 tablet 3  . aspirin (ASPIRIN EC)  81 MG EC tablet Take 81 mg by mouth daily. Swallow whole.    . calcium-vitamin D (OSCAL WITH D) 500-200 MG-UNIT per tablet Take 2 tablets by mouth daily.      . Cholecalciferol (VITAMIN D) 2000 UNITS CAPS Take 1 capsule by mouth daily.      . fluticasone (FLONASE) 50 MCG/ACT nasal spray Place 2 sprays into both nostrils daily. 16 g 11  . isosorbide mononitrate (IMDUR) 30 MG 24 hr tablet Take 30 mg by mouth daily.    Marland Kitchen losartan (COZAAR) 100 MG tablet TAKE 1 TABLET BY MOUTH EVERY DAY 30 tablet 11  . omeprazole (PRILOSEC) 20 MG capsule TAKE 1 CAPSULE BY MOUTH EVERY DAY 30 capsule 5   No current facility-administered medications for this visit.     REVIEW OF SYSTEMS:   Constitutional: Denies fevers, chills or abnormal night sweats Eyes: Denies blurriness of vision, double vision or watery eyes Ears, nose, mouth, throat, and face: Denies mucositis or sore throat Respiratory: Denies cough, dyspnea or wheezes Cardiovascular: Denies palpitation, chest discomfort or lower extremity swelling Gastrointestinal:  Denies nausea, heartburn or change in bowel habits Skin: Denies abnormal skin rashes Lymphatics: Denies new lymphadenopathy or easy bruising Neurological:Denies numbness, tingling or new weaknesses Behavioral/Psych: Mood is stable, no new changes  All other systems were reviewed with the patient and are negative.  PHYSICAL EXAMINATION: ECOG PERFORMANCE STATUS: 0 - Asymptomatic  Vitals:   02/26/16 1110  BP: (!) 148/57  Pulse: 80  Resp: 18  Temp: 98.1 F (36.7 C)   Filed Weights   02/26/16 1110  Weight: 139 lb 3.2 oz (63.1 kg)    GENERAL:alert, no distress and comfortable SKIN: skin color, texture, turgor are normal, no rashes or significant lesions EYES: normal, conjunctiva are pink and non-injected, sclera clear OROPHARYNX:no exudate, no erythema and lips, buccal mucosa, and tongue normal  NECK: supple, thyroid normal size, non-tender, without nodularity LYMPH:  no palpable  lymphadenopathy in the cervical, axillary or inguinal LUNGS: clear to auscultation and percussion with normal breathing effort HEART: regular rate & rhythm and no murmurs and no lower extremity edema ABDOMEN:abdomen soft, non-tender and normal bowel sounds Musculoskeletal:no cyanosis of digits and no clubbing  PSYCH: alert & oriented x 3 with fluent speech NEURO: no focal motor/sensory deficits Breasts: Breast inspection showed them to be symmetrical with no nipple discharge. Palpation of the breasts and axilla revealed no obvious mass that I could appreciate.   LABORATORY DATA:  I have reviewed the data as listed CBC Latest Ref Rng & Units 01/14/2016 12/31/2015 10/09/2015  WBC 3.6 - 11.0 K/uL 7.2 7.2 7.6  Hemoglobin 12.0 - 16.0 g/dL 11.0(L) 11.4(L) 10.8(L)  Hematocrit 35.0 - 47.0 % 31.7(L) 34.4(L) 32.3(L)  Platelets 150 - 440 K/uL 267 261 264.0   CMP Latest Ref Rng & Units 01/14/2016 01/08/2016 01/03/2016  Glucose 65 -  99 mg/dL 114(H) 120(H) CANCELED  BUN 6 - 20 mg/dL 14 14 CANCELED  Creatinine 0.44 - 1.00 mg/dL 0.99 0.89 CANCELED  Sodium 135 - 145 mmol/L 133(L) 126(L) CANCELED  Potassium 3.5 - 5.1 mmol/L 3.6 3.9 CANCELED  Chloride 101 - 111 mmol/L 101 92(L) CANCELED  CO2 22 - 32 mmol/L 24 24 CANCELED  Calcium 8.9 - 10.3 mg/dL 8.9 9.5 CANCELED  Total Protein 6.5 - 8.1 g/dL - - -  Total Bilirubin 0.3 - 1.2 mg/dL - - -  Alkaline Phos 38 - 126 U/L - - -  AST 15 - 41 U/L - - -  ALT 14 - 54 U/L - - -   PATHOLOGY REPORT  Diagnosis 01/26/2016 Breast, left, needle core biopsy, upper - DUCTAL CARCINOMA IN SITU WITH ASSOCIATED NECROSIS AND CALCIFICATION. - SEE COMMENT. Microscopic Comment Although grading of ductal carcinoma in situ is best performed on excision specimen, the ductal carcinoma in situ appears intermediate grade, as sampled. A quantitative estrogen receptor and progesterone receptor will be performed on the tumor and reported in an addendum to follow. In addition to the  above findings, fibrocystic changes with adenosis, usual ductal hyperplasia and associated calcification are also present within the specimen. Dr. Lyndon Code has seen this case in consultation with agreement. The findings are called to the [facility] on 01/29/2016. (RH:ecj 01/29/2016)  Results: IMMUNOHISTOCHEMICAL AND MORPHOMETRIC ANALYSIS PERFORMED MANUALLY Estrogen Receptor: 0%, NEGATIVE Progesterone Receptor: 0%, NEGATIVE   RADIOGRAPHIC STUDIES: I have personally reviewed the radiological images as listed and agreed with the findings in the report. Mm Lt Radioactive Seed Loc Mammo Guide  Result Date: 02/23/2016 CLINICAL DATA:  Patient for preoperative localization prior to left breast lumpectomy. EXAM: MAMMOGRAPHIC GUIDED RADIOACTIVE SEED LOCALIZATION OF THE LEFT BREAST COMPARISON:  Previous exam(s). FINDINGS: Patient presents for radioactive seed localization prior to left breast lump peg. I met with the patient and we discussed the procedure of seed localization including benefits and alternatives. We discussed the high likelihood of a successful procedure. We discussed the risks of the procedure including infection, bleeding, tissue injury and further surgery. We discussed the low dose of radioactivity involved in the procedure. Informed, written consent was given. The usual time-out protocol was performed immediately prior to the procedure. Using mammographic guidance, sterile technique, 1% lidocaine and an I-125 radioactive seed, remaining calcifications and biopsy marking clip within the central left breast were localized using a cranial approach. The follow-up mammogram images confirm the seed in the expected location and were marked for Dr. Marlou Starks. Follow-up survey of the patient confirms presence of the radioactive seed. Order number of I-125 seed:  [id number]. Total activity:  5.056 millicuries  Reference Date: 02/16/2016 The patient tolerated the procedure well and was released  from the [facility]. She was given instructions regarding seed removal. IMPRESSION: Radioactive seed localization left breast. No apparent complications. Electronically Signed   By: Lovey Newcomer M.D.   On: 02/23/2016 13:54   Diagnostic left breast mammogram and ultrasound 01/15/2016 FINDINGS: Mammographically, there is a persistent focal asymmetry in the left upper central breast, middle depth, with associated indeterminate microcalcifications. The finding measures 1.5 x 1.3 x 0.9 cm mammographically.  IMPRESSION: Left breast focal asymmetry containing microcalcifications, for which stereotactic core needle biopsy is recommended.  No evidence of left axillary lymphadenopathy.  RECOMMENDATION: Stereotactic core needle biopsy of the right breast.   ASSESSMENT & PLAN:  82 year old postmenopausal woman, presented with screening discovered left breast DCIS.  1. Left breast DCIS, grade  2, ER- /PR- -I discussed her breast imaging and needle biopsy results with patient and her family members in great detail. -She is a candidate for breast conservation surgery. She has been seen by breast surgeon Dr. Marlou Starks and scheduled for lumpectomy tomorrow.  -Her DCIS will be cured by complete surgical resection. Any form of adjuvant therapy is preventive. -Given her negative ER and PR, and her advanced age, I do not recommend antiestrogen therapy -She will likely benefit from breast radiation if she undergo lumpectomy to decrease the risk of breast cancer. She is scheduled to see radiation oncologist Dr. Camelia Eng in 2 days. I informed [place] that her surgery is tomorrow, and she will reschedule her appointment. -We also discussed that biopsy may have sampling limitation, we will review her surgical path, to see if she has any invasive carcinoma components. -We discussed breast cancer surveillance after she completes treatment, Including annual mammogram, breast exam every 6-12 months.  Plan -She will  have lumpectomy tomorrow  -If her surgical path does not reveal invasive cancer, she probably no need to see me back. She will follow-up with her primary care physician for breast cancer surveillance -She will see Dr. Camelia Eng after her breast surgery    All questions were answered. The patient knows to call the clinic with any problems, questions or concerns. I spent 40 minutes counseling the patient face to face. The total time spent in the appointment was 45 minutes and more than 50% was on counseling.     Truitt Merle, MD 02/26/2016 7:13 AM

## 2016-02-26 NOTE — Progress Notes (Signed)
error 

## 2016-02-27 ENCOUNTER — Ambulatory Visit
Admission: RE | Admit: 2016-02-27 | Discharge: 2016-02-27 | Disposition: A | Discharge status: Home / Self Care | Payer: Medicare Other | Source: Ambulatory Visit | Attending: General Surgery | Admitting: General Surgery

## 2016-02-27 ENCOUNTER — Encounter (HOSPITAL_BASED_OUTPATIENT_CLINIC_OR_DEPARTMENT_OTHER)
Admission: RE | Disposition: A | Discharge status: Home / Self Care | Payer: Self-pay | Source: Ambulatory Visit | Attending: General Surgery

## 2016-02-27 ENCOUNTER — Ambulatory Visit (HOSPITAL_BASED_OUTPATIENT_CLINIC_OR_DEPARTMENT_OTHER): Payer: Medicare Other | Admitting: Anesthesiology

## 2016-02-27 ENCOUNTER — Encounter: Payer: Self-pay | Admitting: Hematology

## 2016-02-27 ENCOUNTER — Ambulatory Visit (HOSPITAL_BASED_OUTPATIENT_CLINIC_OR_DEPARTMENT_OTHER)
Admission: RE | Admit: 2016-02-27 | Discharge: 2016-02-27 | Disposition: A | Discharge status: Home / Self Care | Payer: Medicare Other | Source: Ambulatory Visit | Attending: General Surgery | Admitting: General Surgery

## 2016-02-27 ENCOUNTER — Encounter (HOSPITAL_BASED_OUTPATIENT_CLINIC_OR_DEPARTMENT_OTHER): Payer: Self-pay | Admitting: Anesthesiology

## 2016-02-27 ENCOUNTER — Encounter: Payer: Self-pay | Admitting: *Deleted

## 2016-02-27 DIAGNOSIS — D0512 Intraductal carcinoma in situ of left breast: Secondary | ICD-10-CM

## 2016-02-27 DIAGNOSIS — N6022 Fibroadenosis of left breast: Secondary | ICD-10-CM | POA: Diagnosis not present

## 2016-02-27 DIAGNOSIS — Z87891 Personal history of nicotine dependence: Secondary | ICD-10-CM | POA: Insufficient documentation

## 2016-02-27 DIAGNOSIS — Z79899 Other long term (current) drug therapy: Secondary | ICD-10-CM | POA: Diagnosis not present

## 2016-02-27 DIAGNOSIS — I10 Essential (primary) hypertension: Secondary | ICD-10-CM | POA: Diagnosis not present

## 2016-02-27 DIAGNOSIS — Z7982 Long term (current) use of aspirin: Secondary | ICD-10-CM | POA: Diagnosis not present

## 2016-02-27 DIAGNOSIS — K219 Gastro-esophageal reflux disease without esophagitis: Secondary | ICD-10-CM | POA: Diagnosis not present

## 2016-02-27 DIAGNOSIS — Z7951 Long term (current) use of inhaled steroids: Secondary | ICD-10-CM | POA: Diagnosis not present

## 2016-02-27 DIAGNOSIS — D649 Anemia, unspecified: Secondary | ICD-10-CM | POA: Diagnosis not present

## 2016-02-27 HISTORY — PX: BREAST LUMPECTOMY: SHX2

## 2016-02-27 HISTORY — PX: BREAST LUMPECTOMY WITH RADIOACTIVE SEED LOCALIZATION: SHX6424

## 2016-02-27 SURGERY — BREAST LUMPECTOMY WITH RADIOACTIVE SEED LOCALIZATION
Anesthesia: General | Site: Breast | Laterality: Left

## 2016-02-27 MED ORDER — SCOPOLAMINE 1 MG/3DAYS TD PT72: 1.0000 | MEDICATED_PATCH | Freq: Once | TRANSDERMAL | Status: DC | PRN

## 2016-02-27 MED ORDER — PROPOFOL 10 MG/ML IV BOLUS
INTRAVENOUS | Status: AC
  Filled 2016-02-27: qty 20

## 2016-02-27 MED ORDER — BUPIVACAINE-EPINEPHRINE (PF) 0.25% -1:200000 IJ SOLN
INTRAMUSCULAR | Status: DC | PRN
  Administered 2016-02-27: 10 mL via PERINEURAL

## 2016-02-27 MED ORDER — ONDANSETRON HCL 4 MG/2ML IJ SOLN
INTRAMUSCULAR | Status: AC
  Filled 2016-02-27: qty 2

## 2016-02-27 MED ORDER — PHENYLEPHRINE HCL 10 MG/ML IJ SOLN
INTRAMUSCULAR | Status: DC | PRN
  Administered 2016-02-27 (×2): 80 ug via INTRAVENOUS

## 2016-02-27 MED ORDER — VANCOMYCIN HCL IN DEXTROSE 1-5 GM/200ML-% IV SOLN
1000.0000 mg | INTRAVENOUS | Status: AC
  Administered 2016-02-27: 1000 mg via INTRAVENOUS

## 2016-02-27 MED ORDER — VANCOMYCIN HCL 1000 MG IV SOLR
INTRAVENOUS | Status: AC
  Filled 2016-02-27: qty 1000

## 2016-02-27 MED ORDER — HYDROCODONE-ACETAMINOPHEN 5-325 MG PO TABS
1.0000 | ORAL_TABLET | Freq: Four times a day (QID) | ORAL | Status: DC | PRN
  Administered 2016-02-27: 1 via ORAL

## 2016-02-27 MED ORDER — LIDOCAINE 2% (20 MG/ML) 5 ML SYRINGE
INTRAMUSCULAR | Status: AC
  Filled 2016-02-27: qty 5

## 2016-02-27 MED ORDER — FENTANYL CITRATE (PF) 100 MCG/2ML IJ SOLN: 50.0000 ug | INTRAMUSCULAR | Status: DC | PRN

## 2016-02-27 MED ORDER — PROPOFOL 10 MG/ML IV BOLUS
INTRAVENOUS | Status: DC | PRN
  Administered 2016-02-27: 50 mg via INTRAVENOUS
  Administered 2016-02-27: 150 mg via INTRAVENOUS

## 2016-02-27 MED ORDER — FENTANYL CITRATE (PF) 100 MCG/2ML IJ SOLN
25.0000 ug | INTRAMUSCULAR | Status: DC | PRN
  Administered 2016-02-27: 25 ug via INTRAVENOUS

## 2016-02-27 MED ORDER — VANCOMYCIN HCL IN DEXTROSE 1-5 GM/200ML-% IV SOLN
INTRAVENOUS | Status: AC
  Filled 2016-02-27: qty 200

## 2016-02-27 MED ORDER — CHLORHEXIDINE GLUCONATE CLOTH 2 % EX PADS: 6.0000 | MEDICATED_PAD | Freq: Once | CUTANEOUS | Status: DC

## 2016-02-27 MED ORDER — DEXAMETHASONE SODIUM PHOSPHATE 4 MG/ML IJ SOLN
INTRAMUSCULAR | Status: DC | PRN
  Administered 2016-02-27: 10 mg via INTRAVENOUS

## 2016-02-27 MED ORDER — MIDAZOLAM HCL 2 MG/2ML IJ SOLN: 1.0000 mg | INTRAMUSCULAR | Status: DC | PRN

## 2016-02-27 MED ORDER — PHENYLEPHRINE 40 MCG/ML (10ML) SYRINGE FOR IV PUSH (FOR BLOOD PRESSURE SUPPORT)
PREFILLED_SYRINGE | INTRAVENOUS | Status: AC
  Filled 2016-02-27: qty 10

## 2016-02-27 MED ORDER — DEXAMETHASONE SODIUM PHOSPHATE 10 MG/ML IJ SOLN
INTRAMUSCULAR | Status: AC
  Filled 2016-02-27: qty 1

## 2016-02-27 MED ORDER — LIDOCAINE 2% (20 MG/ML) 5 ML SYRINGE
INTRAMUSCULAR | Status: DC | PRN
  Administered 2016-02-27: 60 mg via INTRAVENOUS

## 2016-02-27 MED ORDER — ONDANSETRON HCL 4 MG/2ML IJ SOLN: 4.0000 mg | Freq: Once | INTRAMUSCULAR | Status: DC | PRN

## 2016-02-27 MED ORDER — LACTATED RINGERS IV SOLN
INTRAVENOUS | Status: DC
  Administered 2016-02-27 (×2): via INTRAVENOUS

## 2016-02-27 MED ORDER — TRAMADOL HCL 50 MG PO TABS: 50.0000 mg | ORAL_TABLET | Freq: Four times a day (QID) | ORAL | 0 refills | Status: DC | PRN

## 2016-02-27 MED ORDER — HYDROCODONE-ACETAMINOPHEN 5-325 MG PO TABS
ORAL_TABLET | ORAL | Status: AC
  Filled 2016-02-27: qty 1

## 2016-02-27 MED ORDER — HYDROCODONE-ACETAMINOPHEN 5-325 MG PO TABS: 1.0000 | ORAL_TABLET | ORAL | 0 refills | Status: DC | PRN

## 2016-02-27 MED ORDER — FENTANYL CITRATE (PF) 100 MCG/2ML IJ SOLN
INTRAMUSCULAR | Status: AC
  Filled 2016-02-27: qty 2

## 2016-02-27 SURGICAL SUPPLY — 46 items
APPLIER CLIP 9.375 MED OPEN (MISCELLANEOUS) ×3
BLADE SURG 15 STRL LF DISP TIS (BLADE) ×1 IMPLANT
BLADE SURG 15 STRL SS (BLADE) ×2
CANISTER SUC SOCK COL 7IN (MISCELLANEOUS) IMPLANT
CANISTER SUCT 1200ML W/VALVE (MISCELLANEOUS) IMPLANT
CHLORAPREP W/TINT 26ML (MISCELLANEOUS) ×3 IMPLANT
CLIP APPLIE 9.375 MED OPEN (MISCELLANEOUS) ×1 IMPLANT
COVER BACK TABLE 60X90IN (DRAPES) ×3 IMPLANT
COVER MAYO STAND STRL (DRAPES) ×3 IMPLANT
COVER PROBE W GEL 5X96 (DRAPES) ×3 IMPLANT
DECANTER SPIKE VIAL GLASS SM (MISCELLANEOUS) IMPLANT
DERMABOND ADVANCED (GAUZE/BANDAGES/DRESSINGS) ×2
DERMABOND ADVANCED .7 DNX12 (GAUZE/BANDAGES/DRESSINGS) ×1 IMPLANT
DEVICE DUBIN W/COMP PLATE 8390 (MISCELLANEOUS) ×3 IMPLANT
DRAPE LAPAROSCOPIC ABDOMINAL (DRAPES) ×3 IMPLANT
DRAPE UTILITY XL STRL (DRAPES) ×3 IMPLANT
ELECT COATED BLADE 2.86 ST (ELECTRODE) ×3 IMPLANT
ELECT REM PT RETURN 9FT ADLT (ELECTROSURGICAL) ×3
ELECTRODE REM PT RTRN 9FT ADLT (ELECTROSURGICAL) ×1 IMPLANT
GLOVE BIO SURGEON STRL SZ 6.5 (GLOVE) ×2 IMPLANT
GLOVE BIO SURGEON STRL SZ7.5 (GLOVE) ×6 IMPLANT
GLOVE BIO SURGEONS STRL SZ 6.5 (GLOVE) ×1
GLOVE BIOGEL PI IND STRL 6.5 (GLOVE) ×1 IMPLANT
GLOVE BIOGEL PI IND STRL 7.0 (GLOVE) ×3 IMPLANT
GLOVE BIOGEL PI INDICATOR 6.5 (GLOVE) ×2
GLOVE BIOGEL PI INDICATOR 7.0 (GLOVE) ×6
GOWN STRL REUS W/ TWL LRG LVL3 (GOWN DISPOSABLE) ×3 IMPLANT
GOWN STRL REUS W/TWL LRG LVL3 (GOWN DISPOSABLE) ×6
ILLUMINATOR WAVEGUIDE N/F (MISCELLANEOUS) IMPLANT
KIT MARKER MARGIN INK (KITS) ×3 IMPLANT
LIGHT WAVEGUIDE WIDE FLAT (MISCELLANEOUS) IMPLANT
NEEDLE HYPO 25X1 1.5 SAFETY (NEEDLE) ×3 IMPLANT
NS IRRIG 1000ML POUR BTL (IV SOLUTION) IMPLANT
PACK BASIN DAY SURGERY FS (CUSTOM PROCEDURE TRAY) ×3 IMPLANT
PENCIL BUTTON HOLSTER BLD 10FT (ELECTRODE) ×3 IMPLANT
SLEEVE SCD COMPRESS KNEE MED (MISCELLANEOUS) ×3 IMPLANT
SPONGE LAP 18X18 X RAY DECT (DISPOSABLE) ×3 IMPLANT
SUT MON AB 4-0 PC3 18 (SUTURE) ×3 IMPLANT
SUT SILK 2 0 SH (SUTURE) IMPLANT
SUT VICRYL 3-0 CR8 SH (SUTURE) ×3 IMPLANT
SYR CONTROL 10ML LL (SYRINGE) IMPLANT
TOWEL OR 17X24 6PK STRL BLUE (TOWEL DISPOSABLE) ×3 IMPLANT
TOWEL OR NON WOVEN STRL DISP B (DISPOSABLE) ×3 IMPLANT
TUBE CONNECTING 20'X1/4 (TUBING) ×1
TUBE CONNECTING 20X1/4 (TUBING) ×2 IMPLANT
YANKAUER SUCT BULB TIP NO VENT (SUCTIONS) IMPLANT

## 2016-02-27 NOTE — Interval H&P Note (Signed)
History and Physical Interval Note:  02/27/2016 8:49 AM  Jenna Young  has presented today for surgery, with the diagnosis of LEFT BREAST DCIS  The various methods of treatment have been discussed with the patient and family. After consideration of risks, benefits and other options for treatment, the patient has consented to  Procedure(s): LEFT BREAST LUMPECTOMY WITH RADIOACTIVE SEED LOCALIZATION (Left) as a surgical intervention .  The patient's history has been reviewed, patient examined, no change in status, stable for surgery.  I have reviewed the patient's chart and labs.  Questions were answered to the patient's satisfaction.     TOTH III,Saidy Ormand S

## 2016-02-27 NOTE — Anesthesia Preprocedure Evaluation (Addendum)
Anesthesia Evaluation  Patient identified by MRN, date of birth, ID band Patient awake    Reviewed: Allergy & Precautions, NPO status , Patient's Chart, lab work & pertinent test results  Airway Mallampati: II  TM Distance: >3 FB Neck ROM: Full    Dental  (+) Dental Advisory Given, Edentulous Upper, Edentulous Lower   Pulmonary former smoker,    Pulmonary exam normal breath sounds clear to auscultation       Cardiovascular hypertension, Pt. on medications Normal cardiovascular exam Rhythm:Regular Rate:Normal  HLD   Neuro/Psych PSYCHIATRIC DISORDERS Anxiety Depression negative neurological ROS     GI/Hepatic Neg liver ROS, GERD  Medicated,  Endo/Other  negative endocrine ROS  Renal/GU negative Renal ROS     Musculoskeletal negative musculoskeletal ROS (+)   Abdominal   Peds  Hematology  (+) Blood dyscrasia, anemia ,   Anesthesia Other Findings Day of surgery medications reviewed with the patient.  DCIS  Reproductive/Obstetrics                            Anesthesia Physical Anesthesia Plan  ASA: III  Anesthesia Plan: General   Post-op Pain Management:    Induction: Intravenous  Airway Management Planned: LMA  Additional Equipment:   Intra-op Plan:   Post-operative Plan: Extubation in OR  Informed Consent: I have reviewed the patients History and Physical, chart, labs and discussed the procedure including the risks, benefits and alternatives for the proposed anesthesia with the patient or authorized representative who has indicated his/her understanding and acceptance.   Dental advisory given  Plan Discussed with: CRNA  Anesthesia Plan Comments: (Risks/benefits of general anesthesia discussed with patient including risk of damage to teeth, lips, gum, and tongue, nausea/vomiting, allergic reactions to medications, and the possibility of heart attack, stroke and death.  All  patient questions answered.  Patient wishes to proceed.)       Anesthesia Quick Evaluation

## 2016-02-27 NOTE — Transfer of Care (Signed)
Immediate Anesthesia Transfer of Care Note  Patient: Jenna Young  Procedure(s) Performed: Procedure(s): LEFT BREAST LUMPECTOMY WITH RADIOACTIVE SEED LOCALIZATION (Left)  Patient Location: PACU  Anesthesia Type:General  Level of Consciousness: sedated  Airway & Oxygen Therapy: Patient Spontanous Breathing and Patient connected to face mask oxygen  Post-op Assessment: Report given to RN and Post -op Vital signs reviewed and stable  Post vital signs: Reviewed and stable  Last Vitals:  Vitals:   02/27/16 0754  BP: (!) 158/53  Pulse: 88  Resp: 20  Temp: 36.6 C    Last Pain:  Vitals:   02/27/16 0754  TempSrc: Oral         Complications: No apparent anesthesia complications

## 2016-02-27 NOTE — Discharge Instructions (Signed)

## 2016-02-27 NOTE — Anesthesia Postprocedure Evaluation (Signed)
Anesthesia Post Note  Patient: Jenna Young  Procedure(s) Performed: Procedure(s) (LRB): LEFT BREAST LUMPECTOMY WITH RADIOACTIVE SEED LOCALIZATION (Left)  Patient location during evaluation: PACU Anesthesia Type: General Level of consciousness: awake and alert Pain management: pain level controlled Vital Signs Assessment: post-procedure vital signs reviewed and stable Respiratory status: spontaneous breathing, nonlabored ventilation, respiratory function stable and patient connected to nasal cannula oxygen Cardiovascular status: blood pressure returned to baseline and stable Postop Assessment: no signs of nausea or vomiting Anesthetic complications: no       Last Vitals:  Vitals:   02/27/16 1047 02/27/16 1055  BP:    Pulse: 69 78  Resp: 13 13  Temp:  36.9 C    Last Pain:  Vitals:   02/27/16 1045  TempSrc:   PainSc: 2                  Catalina Gravel

## 2016-02-27 NOTE — Anesthesia Procedure Notes (Signed)
Procedure Name: LMA Insertion Date/Time: 02/27/2016 9:19 AM Performed by: Toula Moos L Pre-anesthesia Checklist: Patient identified, Emergency Drugs available, Suction available, Patient being monitored and Timeout performed Patient Re-evaluated:Patient Re-evaluated prior to inductionOxygen Delivery Method: Circle system utilized Preoxygenation: Pre-oxygenation with 100% oxygen Intubation Type: IV induction Ventilation: Mask ventilation without difficulty LMA: LMA inserted LMA Size: 4.0 Number of attempts: 1 Airway Equipment and Method: Bite block Placement Confirmation: positive ETCO2 Tube secured with: Tape Dental Injury: Teeth and Oropharynx as per pre-operative assessment

## 2016-02-27 NOTE — H&P (Signed)
Jenna Young  Location: [facility] Patient #: [id number] DOB: Jun 05, 1931 Widowed / Language: [name] / Race: White Female   History of Present Illness  The patient is a 82 year old female who presents with breast cancer. We are asked to see the patient in consultation by Dr. Loura Pardon to evaluate her for a new left breast cancer. The patient is an 82 year old white female who recently went for a routine screening mammogram. At that time she was found to have a 1.5 cm area of abnormal calcification in the upper outer quadrant of the left breast. This was biopsied and came back as high-grade ductal carcinoma in situ. Her axilla looked negative. She was ER and PR negative. She denies any breast pain or discharge from the nipple. She does note that she has had some recent fainting spells and is currently wearing a heart monitor. Her cardiologist is in the process of changing her antihypertensive medicines   Other Problems  Back Pain  Cholelithiasis  Gastroesophageal Reflux Disease  High blood pressure   Past Surgical History  Breast Biopsy  Bilateral. Cesarean Section - 1  Gallbladder Surgery - Laparoscopic   Diagnostic Studies History  Colonoscopy  1-5 years ago Mammogram  within last year  Allergies  BusPIRone HCl *ANTIANXIETY AGENTS*  Rash. Codeine Phosphate *ANALGESICS - OPIOID*  Nausea. HydroCHLOROthiazide *DIURETICS*  Hives. Lansoprazole *CHEMICALS*  Nausea. PenicillAMINE *MISCELLANEOUS THERAPEUTIC CLASSES*  Nausea. Ramipril *ANTIHYPERTENSIVES*  Hives. Sulfa Antibiotics  Rash.  Medication History  Aspirin (81MG  Tablet, Oral daily) Active. Vitamin D (Cholecalciferol) (1000UNIT Capsule, Oral daily) Active. (2 tablets daily.) Flonase (50MCG/ACT Suspension, Nasal daily) Active. Imdur (30MG  Tablet ER 24HR, Oral daily) Active. Cozaar (100MG  Tablet, Oral daily) Active. Antivert (25MG  Tablet, Oral daily) Active. PriLOSEC (20MG   Capsule DR, Oral daily) Active. Medications Reconciled  Social History  Caffeine use  Coffee, Tea. No alcohol use  No drug use  Tobacco use  Former smoker.  Family History  Arthritis  Mother. Hypertension  Daughter, Father, Mother, Sister. Thyroid problems  Daughter.  Pregnancy / Birth History  Age at menarche  45 years. Age of menopause  53-50 Gravida  2 Maternal age  56-20 Para  2    Review of Systems  General Not Present- Appetite Loss, Chills, Fatigue, Fever, Night Sweats, Weight Gain and Weight Loss. Skin Not Present- Change in Wart/Mole, Dryness, Hives, Jaundice, New Lesions, Non-Healing Wounds, Rash and Ulcer. HEENT Present- Hearing Loss and Wears glasses/contact lenses. Not Present- Earache, Hoarseness, Nose Bleed, Oral Ulcers, Ringing in the Ears, Seasonal Allergies, Sinus Pain, Sore Throat, Visual Disturbances and Yellow Eyes. Respiratory Not Present- Bloody sputum, Chronic Cough, Difficulty Breathing, Snoring and Wheezing. Breast Not Present- Breast Mass, Breast Pain, Nipple Discharge and Skin Changes. Cardiovascular Present- Leg Cramps. Not Present- Chest Pain, Difficulty Breathing Lying Down, Palpitations, Rapid Heart Rate, Shortness of Breath and Swelling of Extremities. Gastrointestinal Not Present- Abdominal Pain, Bloating, Bloody Stool, Change in Bowel Habits, Chronic diarrhea, Constipation, Difficulty Swallowing, Excessive gas, Gets full quickly at meals, Hemorrhoids, Indigestion, Nausea, Rectal Pain and Vomiting. Female Genitourinary Present- Urgency. Not Present- Frequency, Nocturia, Painful Urination and Pelvic Pain. Musculoskeletal Present- Back Pain. Not Present- Joint Pain, Joint Stiffness, Muscle Pain, Muscle Weakness and Swelling of Extremities. Neurological Not Present- Decreased Memory, Fainting, Headaches, Numbness, Seizures, Tingling, Tremor, Trouble walking and Weakness. Psychiatric Not Present- Anxiety, Bipolar, Change in Sleep Pattern,  Depression, Fearful and Frequent crying. Endocrine Not Present- Cold Intolerance, Excessive Hunger, Hair Changes, Heat Intolerance, Hot flashes and New Diabetes.  Vitals Weight: 139.6 lb Height: 61in Body Surface Area: 1.62 m Body Mass Index: 26.38 kg/m  Temp.: 98.45F  Pulse: 100 (Regular)  BP: 124/78 (Sitting, Left Arm, Standard)       Physical Exam  General Mental Status-Alert. General Appearance-Consistent with stated age. Hydration-Well hydrated. Voice-Normal.  Head and Neck Head-normocephalic, atraumatic with no lesions or palpable masses. Trachea-midline. Thyroid Gland Characteristics - normal size and consistency.  Eye Eyeball - Bilateral-Extraocular movements intact. Sclera/Conjunctiva - Bilateral-No scleral icterus.  Chest and Lung Exam Chest and lung exam reveals -quiet, even and easy respiratory effort with no use of accessory muscles and on auscultation, normal breath sounds, no adventitious sounds and normal vocal resonance. Inspection Chest Wall - Normal. Back - normal.  Breast Note: There is no palpable mass in either breast. There is no palpable axillary, supraclavicular, or cervical lymphadenopathy. There is a small upper outer quadrant left breast incision that is very hard to detect from a previous benign biopsy   Cardiovascular Cardiovascular examination reveals -normal heart sounds, regular rate and rhythm with no murmurs and normal pedal pulses bilaterally.  Abdomen Inspection Inspection of the abdomen reveals - No Hernias. Skin - Scar - no surgical scars. Palpation/Percussion Palpation and Percussion of the abdomen reveal - Soft, Non Tender, No Rebound tenderness, No Rigidity (guarding) and No hepatosplenomegaly. Auscultation Auscultation of the abdomen reveals - Bowel sounds normal.  Neurologic Neurologic evaluation reveals -alert and oriented x 3 with no impairment of recent or remote memory. Mental  Status-Normal.  Musculoskeletal Normal Exam - Left-Upper Extremity Strength Normal and Lower Extremity Strength Normal. Normal Exam - Right-Upper Extremity Strength Normal and Lower Extremity Strength Normal.  Lymphatic Head & Neck  General Head & Neck Lymphatics: Bilateral - Description - Normal. Axillary  General Axillary Region: Bilateral - Description - Normal. Tenderness - Non Tender. Femoral & Inguinal  Generalized Femoral & Inguinal Lymphatics: Bilateral - Description - Normal. Tenderness - Non Tender.    Assessment & Plan DUCTAL CARCINOMA IN SITU (DCIS) OF LEFT BREAST (D05.12) Impression: The patient appears to have a small area of DCIS in the upper outer left breast. I have talked to her in detail about the different options for treatment and at this point she favors breast conservation. I think this is a very reasonable way of treating her cancer. I would plan for a left breast radioactive seed or wire localized lumpectomy. I have discussed with her in detail the risks and benefits of the operation as well as some of the technical aspects and she understands and wishes to proceed. She is currently wearing a heart monitor and we will get cardiac clearance from her cardiologist, Dr. Rockey Situ, prior to scheduling surgery.

## 2016-02-27 NOTE — Anesthesia Procedure Notes (Deleted)
Procedure Name: LMA Insertion Date/Time: 02/27/2016 9:21 AM Performed by: Toula Moos L Pre-anesthesia Checklist: Patient identified, Emergency Drugs available, Suction available and Patient being monitored Patient Re-evaluated:Patient Re-evaluated prior to inductionOxygen Delivery Method: Circle system utilized Preoxygenation: Pre-oxygenation with 100% oxygen Intubation Type: IV induction Ventilation: Mask ventilation without difficulty LMA: LMA inserted LMA Size: 4.0 Number of attempts: 1 Airway Equipment and Method: Bite block Placement Confirmation: positive ETCO2 Tube secured with: Tape Dental Injury: Teeth and Oropharynx as per pre-operative assessment

## 2016-02-27 NOTE — Op Note (Signed)
02/27/2016  10:14 AM  PATIENT:  Jenna Young  82 y.o. female  PRE-OPERATIVE DIAGNOSIS:  LEFT BREAST DCIS  POST-OPERATIVE DIAGNOSIS:  LEFT BREAST DCIS  PROCEDURE:  Procedure(s): LEFT BREAST LUMPECTOMY WITH RADIOACTIVE SEED LOCALIZATION (Left)  SURGEON:  Surgeon(s) and Role:    * Jovita Kussmaul, MD - Primary  PHYSICIAN ASSISTANT:   ASSISTANTS: none   ANESTHESIA:   local and general  EBL:  Total I/O In: 1000 [I.V.:1000] Out: 4 [Blood:4]  BLOOD ADMINISTERED:none  DRAINS: none   LOCAL MEDICATIONS USED:  MARCAINE     SPECIMEN:  Source of Specimen:  left breast tissue with additional superior margin  DISPOSITION OF SPECIMEN:  PATHOLOGY  COUNTS:  YES  TOURNIQUET:  * No tourniquets in log *  DICTATION: .Dragon Dictation   After informed consent was obtained the patient was brought to the operating room and placed in the supine position on the operating room table. After adequate induction of general anesthesia the patient's left breast was prepped with ChloraPrep, allowed to dry, and draped in usual sterile manner. An appropriate timeout was performed. Previously an I-125 seed was placed in the upper portion of the left breast to mark an area of ductal carcinoma in situ. The neoprobe was said to I-125 in the area of radioactivity was readily identified. A transversely oriented incision was made through her previous incision in the upper portion of the left breast. The incision was carried through the skin and subcutaneous tissue sharply with the electrocautery. Dissection was then carried towards the radioactive seed under the direction of the neoprobe. Once the seed was approached and a circular portion of breast tissue was excised sharply around the radioactive seed while checking the area of radioactivity frequently with the neoprobe. Once the specimen was removed it was oriented with the appropriate paint colors. A specimen radiograph was obtained that showed the clip in seed to  be near the superior portion of the specimen. The specimen was then sent to pathology for further evaluation. An additional superior margin was removed sharply with the electrocautery and marked appropriately. This was also sent to pathology for further evaluation. The wound was then infiltrated with quarter percent Marcaine and irrigated with saline. The cavity was marked with clips. The deep layer of the wound was then closed with layers of interrupted 3-0 Vicryl stitches. The skin was then closed with interrupted 4-0 Monocryl subcuticular stitches. Dermabond dressings were applied. The patient tolerated the procedure well. At the end of the case all needle sponge and instrument counts were correct. The patient was then awakened and taken to recovery in stable condition.  PLAN OF CARE: Discharge to home after PACU  PATIENT DISPOSITION:  PACU - hemodynamically stable.   Delay start of Pharmacological VTE agent (>24hrs) due to surgical blood loss or risk of bleeding: not applicable

## 2016-02-28 ENCOUNTER — Ambulatory Visit
Admission: RE | Admit: 2016-02-28 | Discharge: 2016-02-28 | Disposition: A | Discharge status: Home / Self Care | Payer: Medicare Other | Source: Ambulatory Visit | Attending: Radiation Oncology | Admitting: Radiation Oncology

## 2016-02-28 ENCOUNTER — Encounter (HOSPITAL_BASED_OUTPATIENT_CLINIC_OR_DEPARTMENT_OTHER): Payer: Self-pay | Admitting: General Surgery

## 2016-02-28 DIAGNOSIS — C50112 Malignant neoplasm of central portion of left female breast: Secondary | ICD-10-CM | POA: Insufficient documentation

## 2016-02-28 DIAGNOSIS — Z510 Encounter for antineoplastic radiation therapy: Secondary | ICD-10-CM | POA: Insufficient documentation

## 2016-02-28 DIAGNOSIS — Z171 Estrogen receptor negative status [ER-]: Secondary | ICD-10-CM | POA: Insufficient documentation

## 2016-03-06 ENCOUNTER — Encounter (HOSPITAL_BASED_OUTPATIENT_CLINIC_OR_DEPARTMENT_OTHER): Payer: Self-pay | Admitting: General Surgery

## 2016-03-08 ENCOUNTER — Other Ambulatory Visit: Payer: Self-pay | Admitting: General Surgery

## 2016-03-08 ENCOUNTER — Ambulatory Visit: Payer: Self-pay | Admitting: General Surgery

## 2016-03-09 ENCOUNTER — Telehealth: Payer: Self-pay | Admitting: Family Medicine

## 2016-03-09 DIAGNOSIS — E559 Vitamin D deficiency, unspecified: Secondary | ICD-10-CM

## 2016-03-09 DIAGNOSIS — E538 Deficiency of other specified B group vitamins: Secondary | ICD-10-CM

## 2016-03-09 DIAGNOSIS — I10 Essential (primary) hypertension: Secondary | ICD-10-CM

## 2016-03-09 DIAGNOSIS — E7800 Pure hypercholesterolemia, unspecified: Secondary | ICD-10-CM

## 2016-03-09 NOTE — Telephone Encounter (Signed)
-----   Message from Ellamae Sia sent at 03/08/2016 10:32 AM EST ----- Regarding: Lab orders for Thursday, 1.11.18 Patient is scheduled for CPX labs, please order future labs, Thanks , Karna Christmas

## 2016-03-11 DIAGNOSIS — Z923 Personal history of irradiation: Secondary | ICD-10-CM

## 2016-03-11 HISTORY — DX: Personal history of irradiation: Z92.3

## 2016-03-18 ENCOUNTER — Telehealth: Payer: Self-pay

## 2016-03-18 ENCOUNTER — Encounter: Payer: Self-pay | Admitting: Family Medicine

## 2016-03-18 ENCOUNTER — Ambulatory Visit (INDEPENDENT_AMBULATORY_CARE_PROVIDER_SITE_OTHER): Payer: Medicare Other | Admitting: Family Medicine

## 2016-03-18 DIAGNOSIS — N3000 Acute cystitis without hematuria: Secondary | ICD-10-CM | POA: Diagnosis not present

## 2016-03-18 DIAGNOSIS — N390 Urinary tract infection, site not specified: Secondary | ICD-10-CM | POA: Insufficient documentation

## 2016-03-18 LAB — POC URINALSYSI DIPSTICK (AUTOMATED)
Ketones, UA: NEGATIVE
Nitrite, UA: POSITIVE
Spec Grav, UA: 1.025
Urobilinogen, UA: 4
pH, UA: 5

## 2016-03-18 MED ORDER — CIPROFLOXACIN HCL 250 MG PO TABS: 250.0000 mg | ORAL_TABLET | Freq: Two times a day (BID) | ORAL | 0 refills | Status: DC

## 2016-03-18 NOTE — Progress Notes (Signed)
Subjective:    Patient ID: Jenna Young, female    DOB: Apr 14, 1931, 83 y.o.   MRN: [id number]  HPI  Here for uti symptoms   Urgency and frequency but cannot always get urine volume Only a few drops to go  Urine does not look or smell different -but on AZO  No blood in urine   Drinking water- trying to drink more Also cranberry juice   Feels miserable   No back pain or fever or n/v  Tried azo for symptoms   Results for orders placed or performed in visit on 03/18/16  POCT Urinalysis Dipstick (Automated)  Result Value Ref Range   Color, UA dark orange    Clarity, UA cloudy    Glucose, UA 2+    Bilirubin, UA 2+    Ketones, UA negative    Spec Grav, UA 1.025    Blood, UA 1+    pH, UA 5.0    Protein, UA 15mg /dl    Urobilinogen, UA 4.0    Nitrite, UA positive    Leukocytes, UA large (3+) (A) Negative     Since last visit - L lumpectomy for pre cancer  Has to have one more surgery to get the rest   Patient Active Problem List   Diagnosis Date Noted  . Ductal carcinoma in situ (DCIS) of left breast 02/26/2016  . Varicose vein of leg 10/09/2015  . Fatigue 10/09/2015  . Pedal edema 10/09/2015  . Heart murmur 10/09/2015  . Spinal compression fracture ([place]) 05/07/2015  . Lumbar pain 05/03/2015  . Lipoma of face 11/08/2014  . Abnormal urinalysis 09/20/2013  . Back contusion 09/20/2013  . Dysthymia 09/20/2013  . Encounter for Medicare annual wellness exam 02/03/2013  . Vaso-vagal reaction 09/25/2012  . BPPV (benign paroxysmal positional vertigo) 06/15/2012  . Chronic right ear pain 03/16/2012  . Colon cancer screening 01/17/2012  . Anemia 01/17/2012  . Vertigo 11/25/2011  . GRIEF REACTION 08/01/2009  . Vitamin D deficiency 08/19/2008  . ANXIETY 01/29/2008  . Essential hypertension, benign 09/25/2007  . B12 deficiency 09/24/2006  . Hyperlipidemia 09/24/2006  . Venous (peripheral) insufficiency 09/24/2006  . ALLERGIC RHINITIS 09/24/2006  . GERD 09/24/2006  .  Osteoporosis 09/24/2006   Past Medical History:  Diagnosis Date  . Allergic rhinitis   . Anemia   . Anxiety   . GERD (gastroesophageal reflux disease)   . Hyperlipidemia   . Hypertension    treated by Dr Thurnell Garbe recently with syncope episode  . Osteopenia   . Osteoporosis    Past Surgical History:  Procedure Laterality Date  . APPENDECTOMY    . BREAST LUMPECTOMY WITH RADIOACTIVE SEED LOCALIZATION Left 02/27/2016   Procedure: LEFT BREAST LUMPECTOMY WITH RADIOACTIVE SEED LOCALIZATION;  Surgeon: Autumn Messing III, MD;  Location: [facility];  Service: General;  Laterality: Left;  . BREAST SURGERY  10/10   benign biopsy  negative  . CESAREAN SECTION    . CHOLECYSTECTOMY    . FRACTURE SURGERY  07/2008   neck  . OVARIAN CYST SURGERY     Social History  Substance Use Topics  . Smoking status: Former Smoker    Quit date: 03/11/1968  . Smokeless tobacco: Never Used  . Alcohol use No   Family History  Problem Relation Age of Onset  . Hypertension Mother   . Heart disease Father   . Macular degeneration Sister   . Diabetes Maternal Aunt   . Cancer Maternal Aunt     throat  Allergies  Allergen Reactions  . Buspirone Hcl     REACTION: HEART PALPITATIONS  . Codeine     REACTION: nausea and vomiting  . Hydrochlorothiazide     REACTION: syncope-possibly from dehydration  . Lansoprazole     REACTION: abd pain  . Minocycline Hcl   . Penicillins     REACTION: mouth numbness  . Ramipril     Cough   . Sulfa Antibiotics Nausea Only   Current Outpatient Prescriptions on File Prior to Visit  Medication Sig Dispense Refill  . amLODipine (NORVASC) 5 MG tablet Take 1 tablet (5 mg total) by mouth daily as needed (only for high bnlood pressure). 90 tablet 3  . aspirin (ASPIRIN EC) 81 MG EC tablet Take 81 mg by mouth daily. Swallow whole.    . calcium-vitamin D (OSCAL WITH D) 500-200 MG-UNIT per tablet Take 2 tablets by mouth daily.      . Cholecalciferol (VITAMIN  D) 2000 UNITS CAPS Take 1 capsule by mouth daily.      . isosorbide mononitrate (IMDUR) 30 MG 24 hr tablet Take 30 mg by mouth daily.    Marland Kitchen losartan (COZAAR) 100 MG tablet TAKE 1 TABLET BY MOUTH EVERY DAY 30 tablet 11  . omeprazole (PRILOSEC) 20 MG capsule TAKE 1 CAPSULE BY MOUTH EVERY DAY 30 capsule 5  . fluticasone (FLONASE) 50 MCG/ACT nasal spray Place 2 sprays into both nostrils daily. 16 g 11  . HYDROcodone-acetaminophen (NORCO/VICODIN) 5-325 MG tablet Take 1-2 tablets by mouth every 4 (four) hours as needed for moderate pain or severe pain. (Patient not taking: Reported on 03/18/2016) 10 tablet 0  . traMADol (ULTRAM) 50 MG tablet Take 1-2 tablets (50-100 mg total) by mouth every 6 (six) hours as needed. (Patient not taking: Reported on 03/18/2016) 30 tablet 0   No current facility-administered medications on file prior to visit.        Review of Systems  Constitutional: Positive for fatigue. Negative for activity change, appetite change and fever.  HENT: Negative for congestion and sore throat.   Eyes: Negative for itching and visual disturbance.  Respiratory: Negative for cough and shortness of breath.   Cardiovascular: Negative for leg swelling.  Gastrointestinal: Negative for abdominal distention, abdominal pain, constipation, diarrhea and nausea.  Endocrine: Negative for cold intolerance and polydipsia.  Genitourinary: Positive for dysuria, frequency and urgency. Negative for difficulty urinating, flank pain and hematuria.  Musculoskeletal: Negative for myalgias.  Skin: Negative for rash.  Allergic/Immunologic: Negative for immunocompromised state.  Neurological: Negative for dizziness and weakness.  Hematological: Negative for adenopathy.   .    Objective:   Physical Exam  Constitutional: She appears well-developed and well-nourished. No distress.  overwt and well appearing   HENT:  Head: Normocephalic and atraumatic.  Eyes: Conjunctivae and EOM are normal. Pupils are equal,  round, and reactive to light.  Neck: Normal range of motion. Neck supple.  Cardiovascular: Normal rate, regular rhythm and normal heart sounds.   Pulmonary/Chest: Effort normal and breath sounds normal.  Abdominal: Soft. Bowel sounds are normal. She exhibits no distension. There is tenderness. There is no rebound.  No cva tenderness  Mild suprapubic tenderness  Musculoskeletal: She exhibits no edema.  Lymphadenopathy:    She has no cervical adenopathy.  Neurological: She is alert.  Skin: No rash noted.  Psychiatric: She has a normal mood and affect.          Assessment & Plan:   Problem List Items Addressed This Visit  Genitourinary   UTI (urinary tract infection)    Uncomplicated with pos ua Pending cx  tx with cipro (sulfa allergic) Disc symptomatic care - see instructions on AVS  AZO ok for several days Update if not starting to improve in a week or if worsening    Handout given on uti       Relevant Orders   Urine culture   POCT Urinalysis Dipstick (Automated) (Completed)

## 2016-03-18 NOTE — Patient Instructions (Addendum)
You have a uti  Drink lots of water - aim for 64 oz of fluids per day  Take the cipro as directed  Update if not starting to improve in several days  or if worsening   We will contact you when the urine culture returns     Urinary Tract Infection, Adult A urinary tract infection (UTI) is an infection of any part of the urinary tract, which includes the kidneys, ureters, bladder, and urethra. These organs make, store, and get rid of urine in the body. UTI can be a bladder infection (cystitis) or kidney infection (pyelonephritis). What are the causes? This infection may be caused by fungi, viruses, or bacteria. Bacteria are the most common cause of UTIs. This condition can also be caused by repeated incomplete emptying of the bladder during urination. What increases the risk? This condition is more likely to develop if:  You ignore your need to urinate or hold urine for long periods of time.  You do not empty your bladder completely during urination.  You wipe back to front after urinating or having a bowel movement, if you are female.  You are uncircumcised, if you are female.  You are constipated.  You have a urinary catheter that stays in place (indwelling).  You have a weak defense (immune) system.  You have a medical condition that affects your bowels, kidneys, or bladder.  You have diabetes.  You take antibiotic medicines frequently or for long periods of time, and the antibiotics no longer work well against certain types of infections (antibiotic resistance).  You take medicines that irritate your urinary tract.  You are exposed to chemicals that irritate your urinary tract.  You are female. What are the signs or symptoms? Symptoms of this condition include:  Fever.  Frequent urination or passing small amounts of urine frequently.  Needing to urinate urgently.  Pain or burning with urination.  Urine that smells bad or unusual.  Cloudy urine.  Pain in the  lower abdomen or back.  Trouble urinating.  Blood in the urine.  Vomiting or being less hungry than normal.  Diarrhea or abdominal pain.  Vaginal discharge, if you are female. How is this diagnosed? This condition is diagnosed with a medical history and physical exam. You will also need to provide a urine sample to test your urine. Other tests may be done, including:  Blood tests.  Sexually transmitted disease (STD) testing. If you have had more than one UTI, a cystoscopy or imaging studies may be done to determine the cause of the infections. How is this treated? Treatment for this condition often includes a combination of two or more of the following:  Antibiotic medicine.  Other medicines to treat less common causes of UTI.  Over-the-counter medicines to treat pain.  Drinking enough water to stay hydrated. Follow these instructions at home:  Take over-the-counter and prescription medicines only as told by your health care provider.  If you were prescribed an antibiotic, take it as told by your health care provider. Do not stop taking the antibiotic even if you start to feel better.  Avoid alcohol, caffeine, tea, and carbonated beverages. They can irritate your bladder.  Drink enough fluid to keep your urine clear or pale yellow.  Keep all follow-up visits as told by your health care provider. This is important.  Make sure to:  Empty your bladder often and completely. Do not hold urine for long periods of time.  Empty your bladder before and after sex.  Wipe from front to back after a bowel movement if you are female. Use each tissue one time when you wipe. Contact a health care provider if:  You have back pain.  You have a fever.  You feel nauseous or vomit.  Your symptoms do not get better after 3 days.  Your symptoms go away and then return. Get help right away if:  You have severe back pain or lower abdominal pain.  You are vomiting and cannot keep  down any medicines or water. This information is not intended to replace advice given to you by your health care provider. Make sure you discuss any questions you have with your health care provider. Document Released: 12/05/2004 Document Revised: 08/09/2015 Document Reviewed: 01/16/2015 Elsevier Interactive Patient Education  2017 [employer].

## 2016-03-18 NOTE — Assessment & Plan Note (Signed)
Uncomplicated with pos ua Pending cx  tx with cipro (sulfa allergic) Disc symptomatic care - see instructions on AVS  AZO ok for several days Update if not starting to improve in a week or if worsening    Handout given on uti

## 2016-03-18 NOTE — Telephone Encounter (Signed)
Please call her and tell her to come in now - I have coverage Thanks

## 2016-03-18 NOTE — Progress Notes (Signed)
Pre visit review using our clinic review tool, if applicable. No additional management support is needed unless otherwise documented below in the visit note. 

## 2016-03-18 NOTE — Telephone Encounter (Signed)
Pt left v/m; pt has UTI; pt having bladder spasms, pt has urgency feeling but cannot urinate; last normal urination was 03/17/16. No back pain and no fever and no abd distention. Pt prefers not to schedule appt since no available appts at [facility], pt has been taking AZO otc which has helped minimally. Pt request med sent to [facility] [address] Pt request cb. Pt last seen 10/09/15 but pt has medicare wellness on 03/29/16.

## 2016-03-19 NOTE — Telephone Encounter (Signed)
Pt was seen

## 2016-03-20 LAB — URINE CULTURE: Colony Count: >=100000

## 2016-03-21 ENCOUNTER — Ambulatory Visit (INDEPENDENT_AMBULATORY_CARE_PROVIDER_SITE_OTHER): Payer: Medicare Other

## 2016-03-21 VITALS — BP 138/70 | HR 78 | Temp 98.0°F | Ht 61.0 in | Wt 137.2 lb

## 2016-03-21 DIAGNOSIS — E538 Deficiency of other specified B group vitamins: Secondary | ICD-10-CM | POA: Diagnosis not present

## 2016-03-21 DIAGNOSIS — Z0000 Encounter for general adult medical examination without abnormal findings: Secondary | ICD-10-CM | POA: Diagnosis not present

## 2016-03-21 DIAGNOSIS — I10 Essential (primary) hypertension: Secondary | ICD-10-CM

## 2016-03-21 DIAGNOSIS — E559 Vitamin D deficiency, unspecified: Secondary | ICD-10-CM

## 2016-03-21 DIAGNOSIS — E7800 Pure hypercholesterolemia, unspecified: Secondary | ICD-10-CM | POA: Diagnosis not present

## 2016-03-21 LAB — CBC WITH DIFFERENTIAL/PLATELET
Basophils Absolute: 0 10*3/uL (ref 0.0–0.1)
Basophils Relative: 0.4 % (ref 0.0–3.0)
Eosinophils Absolute: 0.2 10*3/uL (ref 0.0–0.7)
Eosinophils Relative: 3.6 % (ref 0.0–5.0)
HCT: 32.7 % — ABNORMAL LOW (ref 36.0–46.0)
Hemoglobin: 11 g/dL — ABNORMAL LOW (ref 12.0–15.0)
Lymphocytes Relative: 42.4 % (ref 12.0–46.0)
Lymphs Abs: 2.7 10*3/uL (ref 0.7–4.0)
MCHC: 33.6 g/dL (ref 30.0–36.0)
MCV: 78.9 fl (ref 78.0–100.0)
Monocytes Absolute: 0.6 10*3/uL (ref 0.1–1.0)
Monocytes Relative: 9.1 % (ref 3.0–12.0)
Neutro Abs: 2.9 10*3/uL (ref 1.4–7.7)
Neutrophils Relative %: 44.5 % (ref 43.0–77.0)
Platelets: 312 10*3/uL (ref 150.0–400.0)
RBC: 4.15 Mil/uL (ref 3.87–5.11)
RDW: 17.2 % — ABNORMAL HIGH (ref 11.5–15.5)
WBC: 6.4 10*3/uL (ref 4.0–10.5)

## 2016-03-21 LAB — COMPREHENSIVE METABOLIC PANEL
ALT: 10 U/L (ref 0–35)
AST: 15 U/L (ref 0–37)
Albumin: 3.9 g/dL (ref 3.5–5.2)
Alkaline Phosphatase: 90 U/L (ref 39–117)
BUN: 12 mg/dL (ref 6–23)
CO2: 28 mEq/L (ref 19–32)
Calcium: 9.7 mg/dL (ref 8.4–10.5)
Chloride: 104 mEq/L (ref 96–112)
Creatinine, Ser: 0.94 mg/dL (ref 0.40–1.20)
GFR: 60.56 mL/min (ref >60.00)
Glucose, Bld: 98 mg/dL (ref 70–99)
Potassium: 4.4 mEq/L (ref 3.5–5.1)
Sodium: 139 mEq/L (ref 135–145)
Total Bilirubin: 0.2 mg/dL (ref 0.2–1.2)
Total Protein: 8 g/dL (ref 6.0–8.3)

## 2016-03-21 LAB — LIPID PANEL
Cholesterol: 205 mg/dL — ABNORMAL HIGH (ref 0–200)
HDL: 56.5 mg/dL (ref >39.00)
LDL Cholesterol: 125 mg/dL — ABNORMAL HIGH (ref 0–99)
NonHDL: 148.26
Total CHOL/HDL Ratio: 4
Triglycerides: 117 mg/dL (ref 0.0–149.0)
VLDL: 23.4 mg/dL (ref 0.0–40.0)

## 2016-03-21 LAB — VITAMIN B12: Vitamin B-12: 281 pg/mL (ref 211–911)

## 2016-03-21 LAB — VITAMIN D 25 HYDROXY (VIT D DEFICIENCY, FRACTURES): VITD: 51.46 ng/mL (ref 30.00–100.00)

## 2016-03-21 LAB — TSH: TSH: 2.07 u[IU]/mL (ref 0.35–4.50)

## 2016-03-21 MED ORDER — CYANOCOBALAMIN 1000 MCG/ML IJ SOLN
1000.0000 ug | Freq: Once | INTRAMUSCULAR | Status: AC
  Administered 2016-03-21: 1000 ug via INTRAMUSCULAR

## 2016-03-21 NOTE — Progress Notes (Signed)
PCP notes:   Health maintenance:  Bone density - declined Colonoscopy - pt will make appt after cancer treatment Shingles - declined   Abnormal screenings:   Hearing - failed (assessed right ear only) Mini-Cog score: 18/20  Patient concerns:   Pt requested B12 injection. Injection administered.  Nurse concerns:  None  Next PCP appt:   03/29/16 @ 1430

## 2016-03-21 NOTE — Patient Instructions (Signed)
Ms. Parkman , Thank you for taking time to come for your Medicare Wellness Visit. I appreciate your ongoing commitment to your health goals. Please review the following plan we discussed and let me know if I can assist you in the future.   These are the goals we discussed: Goals    . Increase physical activity          Starting 03/21/2016, I will continue to exercise at least 30 min 3 days per week.        This is a list of the screening recommended for you and due dates:  Health Maintenance  Topic Date Due  . Colon Cancer Screening  03/08/2019*  . DEXA scan (bone density measurement)  03/21/2025*  . Shingles Vaccine  03/21/2025*  . Mammogram  01/07/2017  . Tetanus Vaccine  01/09/2018  . Flu Shot  Completed  . Pneumonia vaccines  Completed  *Topic was postponed. The date shown is not the original due date.   Preventive Care for Adults  A healthy lifestyle and preventive care can promote health and wellness. Preventive health guidelines for adults include the following key practices.  . A routine yearly physical is a good way to check with your health care provider about your health and preventive screening. It is a chance to share any concerns and updates on your health and to receive a thorough exam.  . Visit your dentist for a routine exam and preventive care every 6 months. Brush your teeth twice a day and floss once a day. Good oral hygiene prevents tooth decay and gum disease.  . The frequency of eye exams is based on your age, health, family medical history, use  of contact lenses, and other factors. Follow your health care provider's ecommendations for frequency of eye exams.  . Eat a healthy diet. Foods like vegetables, fruits, whole grains, low-fat dairy products, and lean protein foods contain the nutrients you need without too many calories. Decrease your intake of foods high in solid fats, added sugars, and salt. Eat the right amount of calories for you. Get information  about a proper diet from your health care provider, if necessary.  . Regular physical exercise is one of the most important things you can do for your health. Most adults should get at least 150 minutes of moderate-intensity exercise (any activity that increases your heart rate and causes you to sweat) each week. In addition, most adults need muscle-strengthening exercises on 2 or more days a week.  Silver Sneakers may be a benefit available to you. To determine eligibility, you may visit the website: www.silversneakers.com or contact program at (941) 072-6688 Mon-Fri between 8AM-8PM.   . Maintain a healthy weight. The body mass index (BMI) is a screening tool to identify possible weight problems. It provides an estimate of body fat based on height and weight. Your health care provider can find your BMI and can help you achieve or maintain a healthy weight.   For adults 20 years and older: ? A BMI below 18.5 is considered underweight. ? A BMI of 18.5 to 24.9 is normal. ? A BMI of 25 to 29.9 is considered overweight. ? A BMI of 30 and above is considered obese.   . Maintain normal blood lipids and cholesterol levels by exercising and minimizing your intake of saturated fat. Eat a balanced diet with plenty of fruit and vegetables. Blood tests for lipids and cholesterol should begin at age 72 and be repeated every 5 years. If your lipid  or cholesterol levels are high, you are over 50, or you are at high risk for heart disease, you may need your cholesterol levels checked more frequently. Ongoing high lipid and cholesterol levels should be treated with medicines if diet and exercise are not working.  . If you smoke, find out from your health care provider how to quit. If you do not use tobacco, please do not start.  . If you choose to drink alcohol, please do not consume more than 2 drinks per day. One drink is considered to be 12 ounces (355 mL) of beer, 5 ounces (148 mL) of wine, or 1.5 ounces (44  mL) of liquor.  . If you are 75-23 years old, ask your health care provider if you should take aspirin to prevent strokes.  . Use sunscreen. Apply sunscreen liberally and repeatedly throughout the day. You should seek shade when your shadow is shorter than you. Protect yourself by wearing long sleeves, pants, a wide-brimmed hat, and sunglasses year round, whenever you are outdoors.  . Once a month, do a whole body skin exam, using a mirror to look at the skin on your back. Tell your health care provider of new moles, moles that have irregular borders, moles that are larger than a pencil eraser, or moles that have changed in shape or color.

## 2016-03-21 NOTE — Progress Notes (Signed)
I reviewed health advisor's note, was available for consultation, and agree with documentation and plan.  

## 2016-03-21 NOTE — Progress Notes (Signed)
Pre visit review using our clinic review tool, if applicable. No additional management support is needed unless otherwise documented below in the visit note. 

## 2016-03-21 NOTE — Progress Notes (Signed)
Subjective:   Jenna Young is a 83 y.o. female who presents for Medicare Annual (Subsequent) preventive examination.  Review of Systems:  N/A Cardiac Risk Factors include: advanced age (>85men, >28 women);dyslipidemia;hypertension     Objective:     Vitals: BP 138/70 (BP Location: Right Arm, Patient Position: Sitting, Cuff Size: Normal) Comment: no medication taken  Pulse 78   Temp 98 F (36.7 C) (Oral)   Ht 5\' 1"  (1.549 m) Comment: no shoes  Wt 137 lb 4 oz (62.3 kg)   SpO2 97%   BMI 25.93 kg/m   Body mass index is 25.93 kg/m.   Tobacco History  Smoking Status  . Former Smoker  . Quit date: 03/11/1968  Smokeless Tobacco  . Never Used     Counseling given: No   Past Medical History:  Diagnosis Date  . Allergic rhinitis   . Anemia   . Anxiety   . GERD (gastroesophageal reflux disease)   . Hyperlipidemia   . Hypertension    treated by Dr Thurnell Garbe recently with syncope episode  . Osteopenia   . Osteoporosis    Past Surgical History:  Procedure Laterality Date  . APPENDECTOMY    . BREAST LUMPECTOMY WITH RADIOACTIVE SEED LOCALIZATION Left 02/27/2016   Procedure: LEFT BREAST LUMPECTOMY WITH RADIOACTIVE SEED LOCALIZATION;  Surgeon: Autumn Messing III, MD;  Location: [facility];  Service: General;  Laterality: Left;  . BREAST SURGERY  10/10   benign biopsy  negative  . CESAREAN SECTION    . CHOLECYSTECTOMY    . FRACTURE SURGERY  07/2008   neck  . OVARIAN CYST SURGERY     Family History  Problem Relation Age of Onset  . Hypertension Mother   . Heart disease Father   . Macular degeneration Sister   . Diabetes Maternal Aunt   . Cancer Maternal Aunt     throat   History  Sexual Activity  . Sexual activity: No    Outpatient Encounter Prescriptions as of 03/21/2016  Medication Sig  . amLODipine (NORVASC) 5 MG tablet Take 1 tablet (5 mg total) by mouth daily as needed (only for high bnlood pressure).  Marland Kitchen aspirin (ASPIRIN EC) 81 MG EC tablet  Take 81 mg by mouth daily. Swallow whole.  . calcium-vitamin D (OSCAL WITH D) 500-200 MG-UNIT per tablet Take 2 tablets by mouth daily.    . Cholecalciferol (VITAMIN D) 2000 UNITS CAPS Take 1 capsule by mouth daily.    . ciprofloxacin (CIPRO) 250 MG tablet Take 1 tablet (250 mg total) by mouth 2 (two) times daily.  . isosorbide mononitrate (IMDUR) 30 MG 24 hr tablet Take 30 mg by mouth daily.  Marland Kitchen losartan (COZAAR) 100 MG tablet TAKE 1 TABLET BY MOUTH EVERY DAY  . omeprazole (PRILOSEC) 20 MG capsule TAKE 1 CAPSULE BY MOUTH EVERY DAY  . fluticasone (FLONASE) 50 MCG/ACT nasal spray Place 2 sprays into both nostrils daily.  Marland Kitchen HYDROcodone-acetaminophen (NORCO/VICODIN) 5-325 MG tablet Take 1-2 tablets by mouth every 4 (four) hours as needed for moderate pain or severe pain. (Patient not taking: Reported on 03/21/2016)  . traMADol (ULTRAM) 50 MG tablet Take 1-2 tablets (50-100 mg total) by mouth every 6 (six) hours as needed. (Patient not taking: Reported on 03/21/2016)  . [EXPIRED] cyanocobalamin ((VITAMIN B-12)) injection 1,000 mcg    No facility-administered encounter medications on file as of 03/21/2016.     Activities of Daily Living In your present state of health, do you have any difficulty performing  the following activities: 03/21/2016 02/27/2016  Hearing? Y N  Vision? Y N  Difficulty concentrating or making decisions? N N  Walking or climbing stairs? N N  Dressing or bathing? N N  Doing errands, shopping? N -  Preparing Food and eating ? N -  Using the Toilet? N -  In the past six months, have you accidently leaked urine? N -  Do you have problems with loss of bowel control? N -  Managing your Medications? N -  Managing your Finances? N -  Housekeeping or managing your Housekeeping? N -  Some recent data might be hidden    Patient Care Team: Abner Greenspan, MD as PCP - General Minna Merritts, MD as Consulting Physician (Cardiology) Autumn Messing III, MD as Consulting Physician (General  Surgery) Truitt Merle, MD as Consulting Physician (Hematology) Eppie Gibson, MD as Attending Physician (Radiation Oncology)    Assessment:     Hearing Screening   125Hz  250Hz  500Hz  1000Hz  2000Hz  3000Hz  4000Hz  6000Hz  8000Hz   Right ear:   0 0 0  0    Left ear:           Comments: Hearing aid - left ear  Vision Screening Comments: Last vision exam in Jan 2017 with Dr. Matilde Sprang   Exercise Activities and Dietary recommendations Current Exercise Habits: Home exercise routine, Type of exercise: strength training/weights;stretching;treadmill, Time (Minutes): 30, Frequency (Times/Week): 3, Weekly Exercise (Minutes/Week): 90, Intensity: Moderate, Exercise limited by: None identified  Goals    . Increase physical activity          Starting 03/21/2016, I will continue to exercise at least 30 min 3 days per week.       Fall Risk Fall Risk  03/21/2016 03/27/2015 02/23/2014 02/02/2013  Falls in the past year? No No No No   Depression Screen PHQ 2/9 Scores 03/21/2016 03/27/2015 02/23/2014 02/02/2013  PHQ - 2 Score 0 0 0 0     Cognitive Function MMSE - Mini Mental State Exam 03/21/2016  Orientation to time 5  Orientation to Place 5  Registration 3  Attention/ Calculation 0  Recall 1  Recall-comments pt was unable to recall 2 of 3 words  Language- name 2 objects 0  Language- repeat 1  Language- follow 3 step command 3  Language- read & follow direction 0  Write a sentence 0  Copy design 0  Total score 18       PLEASE NOTE: A Mini-Cog screen was completed. Maximum score is 20. A value of 0 denotes this part of Folstein MMSE was not completed or the patient failed this part of the Mini-Cog screening.   Mini-Cog Screening Orientation to Time - Max 5 pts Orientation to Place - Max 5 pts Registration - Max 3 pts Recall - Max 3 pts Language Repeat - Max 1 pts Language Follow 3 Step Command - Max 3 pts   Immunization History  Administered Date(s) Administered  . Influenza Split 12/11/2010,  01/17/2012  . Influenza Whole 01/09/2006, 12/10/2006, 12/08/2007, 12/13/2009  . Influenza,inj,Quad PF,36+ Mos 12/09/2012, 12/15/2013, 12/22/2014, 12/21/2015  . Pneumococcal Conjugate-13 02/23/2014  . Pneumococcal Polysaccharide-23 12/11/2010  . Td 01/10/2008   Screening Tests Health Maintenance  Topic Date Due  . COLONOSCOPY  03/08/2019 (Originally 03/08/2016)  . DEXA SCAN  03/21/2025 (Originally 01/11/1999)  . ZOSTAVAX  03/21/2025 (Originally 01/10/1994)  . MAMMOGRAM  01/07/2017  . TETANUS/TDAP  01/09/2018  . INFLUENZA VACCINE  Completed  . PNA vac Low Risk Adult  Completed  Plan:     I have personally reviewed and addressed the Medicare Annual Wellness questionnaire and have noted the following in the patient's chart:  A. Medical and social history B. Use of alcohol, tobacco or illicit drugs  C. Current medications and supplements D. Functional ability and status E.  Nutritional status F.  Physical activity G. Advance directives H. List of other physicians I.  Hospitalizations, surgeries, and ER visits in previous 12 months J.  [place] to include hearing, vision, cognitive, depression L. Referrals and appointments - none  In addition, I have reviewed and discussed with patient certain preventive protocols, quality metrics, and best practice recommendations. A written personalized care plan for preventive services as well as general preventive health recommendations were provided to patient.  See attached scanned questionnaire for additional information.   Signed,   Lindell Noe, MHA, BS, LPN Health Coach

## 2016-03-22 NOTE — Progress Notes (Signed)
error 

## 2016-03-26 ENCOUNTER — Encounter (HOSPITAL_BASED_OUTPATIENT_CLINIC_OR_DEPARTMENT_OTHER): Payer: Self-pay | Admitting: *Deleted

## 2016-03-27 ENCOUNTER — Ambulatory Visit
Admission: RE | Admit: 2016-03-27 | Discharge: 2016-03-27 | Disposition: A | Discharge status: Home / Self Care | Payer: Medicare Other | Source: Ambulatory Visit | Attending: Radiation Oncology | Admitting: Radiation Oncology

## 2016-03-27 ENCOUNTER — Ambulatory Visit: Payer: Medicare Other

## 2016-03-28 NOTE — Progress Notes (Signed)
Boost drink picked up for morning of surgery. Pt verbalized understanding of instructions. NPO otherwise

## 2016-03-29 ENCOUNTER — Encounter: Payer: Self-pay | Admitting: Family Medicine

## 2016-03-29 ENCOUNTER — Ambulatory Visit (INDEPENDENT_AMBULATORY_CARE_PROVIDER_SITE_OTHER): Payer: Medicare Other | Admitting: Family Medicine

## 2016-03-29 VITALS — BP 130/60 | HR 96 | Temp 98.2°F | Ht 61.0 in | Wt 141.0 lb

## 2016-03-29 DIAGNOSIS — D649 Anemia, unspecified: Secondary | ICD-10-CM

## 2016-03-29 DIAGNOSIS — D0512 Intraductal carcinoma in situ of left breast: Secondary | ICD-10-CM | POA: Diagnosis not present

## 2016-03-29 DIAGNOSIS — Z8781 Personal history of (healed) traumatic fracture: Secondary | ICD-10-CM

## 2016-03-29 DIAGNOSIS — I10 Essential (primary) hypertension: Secondary | ICD-10-CM

## 2016-03-29 DIAGNOSIS — M810 Age-related osteoporosis without current pathological fracture: Secondary | ICD-10-CM | POA: Diagnosis not present

## 2016-03-29 DIAGNOSIS — Z1211 Encounter for screening for malignant neoplasm of colon: Secondary | ICD-10-CM

## 2016-03-29 DIAGNOSIS — E559 Vitamin D deficiency, unspecified: Secondary | ICD-10-CM

## 2016-03-29 DIAGNOSIS — E7800 Pure hypercholesterolemia, unspecified: Secondary | ICD-10-CM

## 2016-03-29 DIAGNOSIS — E538 Deficiency of other specified B group vitamins: Secondary | ICD-10-CM

## 2016-03-29 NOTE — Patient Instructions (Addendum)
For cholesterol    Avoid red meat/ fried foods/ egg yolks/ fatty breakfast meats/ butter, cheese and high fat dairy/ and shellfish    Let me know when you need medicine refills -which pharmacy you will use   Good luck with your procedure Monday   Stay active when you are feeling better- keep drinking your water (tea is ok but drink mostly water)  Overall labs are stable but cholesterol is up a little   Continue B12 shots every 3 months

## 2016-03-29 NOTE — Progress Notes (Signed)
Pre visit review using our clinic review tool, if applicable. No additional management support is needed unless otherwise documented below in the visit note. 

## 2016-03-29 NOTE — Progress Notes (Signed)
Subjective:    Patient ID: Jenna Young, female    DOB: 1931/06/06, 83 y.o.   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  Is wearing her hearing aide - she thinks it is helpful and does not want further eval  Does not want a 2nd hearing aide at this time due to expense    Mini cog score was 18/20- failed 2 recall questions  She has not been worried about her memory  Thinks she was not paying attention and was nervous  Family has not mentioned anything  Never misses appts or dates  Does not tend to misplace things often Does not get lost at all   Colonoscopy 12/14 pos for polyps  No recall due to age for screening  She is signed up for the cologuard kit     Mammogram 10/17 Self breast exam - no change except for healing incision  Hx of breast cancer  Has next procedure to get the margin left- coming up on Monday  Also considering radiation    bp is stable today  No cp or palpitations or headaches or edema  No side effects to medicines  BP Readings from Last 3 Encounters:  03/29/16 (!) 142/62  03/21/16 138/70  03/18/16 (!) 138/52      Hx of anemia in the past w/o source Lab Results  Component Value Date   WBC 6.4 03/21/2016   HGB 11.0 (L) 03/21/2016   HCT 32.7 (L) 03/21/2016   MCV 78.9 03/21/2016   PLT 312.0 03/21/2016     Hx of B12 def Had a shot 1/11 and gets them every 3 months  Lab Results  Component Value Date   VITAMINB12 281 03/21/2016   Cholesterol Lab  Results  Component Value Date   CHOL 205 (H) 03/21/2016   CHOL 200 03/17/2015   CHOL 221 (H) 02/21/2014   Lab Results  Component Value Date   HDL 56.50 03/21/2016   HDL 51.50 03/17/2015   HDL 48.60 02/21/2014   Lab Results  Component Value Date   LDLCALC 125 (H) 03/21/2016   LDLCALC 115 (H) 03/17/2015   LDLCALC 137 (H) 02/21/2014   Lab Results  Component Value Date   TRIG 117.0 03/21/2016   TRIG 167.0 (H) 03/17/2015   TRIG 179.0 (H) 02/21/2014   Lab Results  Component Value Date   CHOLHDL 4 03/21/2016   CHOLHDL 4 03/17/2015   CHOLHDL 5 02/21/2014   Lab Results  Component Value Date   LDLDIRECT 116.6 01/27/2013   LDLDIRECT 114.5 12/04/2010   LDLDIRECT 117.1 04/23/2010    LDL up about 10 pts Not as much exercise - will get back to it when she can  Does try to fit in vegetables   Patient Active Problem List   Diagnosis Date Noted  . Ductal carcinoma in situ (DCIS) of left  breast 02/26/2016  . Varicose vein of leg 10/09/2015  . Fatigue 10/09/2015  . Pedal edema 10/09/2015  . Heart murmur 10/09/2015  . History of spinal fracture 05/07/2015  . Lumbar pain 05/03/2015  . Lipoma of face 11/08/2014  . Dysthymia 09/20/2013  . Encounter for Medicare annual wellness exam 02/03/2013  . BPPV (benign paroxysmal positional vertigo) 06/15/2012  . Chronic right ear pain 03/16/2012  . Colon cancer screening 01/17/2012  . Anemia 01/17/2012  . GRIEF REACTION 08/01/2009  . Vitamin D deficiency 08/19/2008  . ANXIETY 01/29/2008  . Essential hypertension, benign 09/25/2007  . B12 deficiency 09/24/2006  . Hyperlipidemia 09/24/2006  . Venous (peripheral) insufficiency 09/24/2006  . ALLERGIC RHINITIS 09/24/2006  . GERD 09/24/2006  . Osteoporosis 09/24/2006   Past Medical History:  Diagnosis Date  . Allergic rhinitis   . Anemia   . Anxiety   . GERD (gastroesophageal reflux disease)   . Hyperlipidemia   . Hypertension    treated by Dr Thurnell Garbe recently with syncope  episode  . Osteopenia   . Osteoporosis    Past Surgical History:  Procedure Laterality Date  . APPENDECTOMY    . BREAST LUMPECTOMY WITH RADIOACTIVE SEED LOCALIZATION Left 02/27/2016   Procedure: LEFT BREAST LUMPECTOMY WITH RADIOACTIVE SEED LOCALIZATION;  Surgeon: Autumn Messing III, MD;  Location: [facility];  Service: General;  Laterality: Left;  . BREAST SURGERY  10/10   benign biopsy  negative  . CESAREAN SECTION    . CHOLECYSTECTOMY    . FRACTURE SURGERY  07/2008   neck  . OVARIAN CYST SURGERY     Social History  Substance Use Topics  . Smoking status: Former Smoker    Quit date: 03/11/1968  . Smokeless tobacco: Never Used  . Alcohol use No   Family History  Problem Relation Age of Onset  . Hypertension Mother   . Heart disease Father   . Macular degeneration Sister   . Diabetes Maternal Aunt   . Cancer Maternal Aunt     throat   Allergies  Allergen Reactions  . Buspirone Hcl     REACTION: HEART PALPITATIONS  . Codeine     REACTION: nausea and vomiting  . Hydrochlorothiazide     REACTION: syncope-possibly from dehydration  . Lansoprazole     REACTION: abd pain  . Minocycline Hcl   . Penicillins     REACTION: mouth numbness  . Ramipril     Cough   . Sulfa Antibiotics Nausea Only   Current Outpatient Prescriptions on File Prior to Visit  Medication Sig Dispense Refill  . amLODipine (NORVASC) 5 MG tablet Take 1 tablet (5 mg total) by mouth daily as needed (only for high bnlood pressure). 90 tablet 3  . aspirin (ASPIRIN EC) 81 MG EC tablet Take 81 mg by mouth daily. Swallow whole.    . calcium-vitamin D (OSCAL WITH D) 500-200 MG-UNIT per tablet Take 2 tablets by mouth daily.      . Cholecalciferol (VITAMIN D) 2000 UNITS CAPS Take 1 capsule by mouth daily.      . isosorbide mononitrate (IMDUR) 30 MG 24 hr tablet Take 30 mg by mouth daily.    Marland Kitchen losartan (COZAAR) 100 MG tablet TAKE 1 TABLET BY MOUTH EVERY DAY 30 tablet 11  . omeprazole (PRILOSEC) 20 MG  capsule TAKE 1 CAPSULE BY MOUTH EVERY DAY 30 capsule 5  . fluticasone (FLONASE) 50 MCG/ACT nasal spray Place 2 sprays into both nostrils daily. 16 g 11   No  current facility-administered medications on file prior to visit.     Review of Systems Review of Systems  Constitutional: Negative for fever, appetite change, fatigue and unexpected weight change.  Eyes: Negative for pain and visual disturbance.  Respiratory: Negative for cough and shortness of breath.   Cardiovascular: Negative for cp or palpitations    Gastrointestinal: Negative for nausea, diarrhea and constipation.  Genitourinary: Negative for urgency and frequency.  Skin: Negative for pallor or rash   Neurological: Negative for weakness, light-headedness, numbness and headaches.  Hematological: Negative for adenopathy. Does not bruise/bleed easily.  Psychiatric/Behavioral: Negative for dysphoric mood. The patient is nervous/anxious.         Objective:   Physical Exam  Constitutional: She appears well-developed and well-nourished. No distress.  Well appearing anxious elderly female   HENT:  Head: Normocephalic and atraumatic.  Right Ear: External ear normal.  Left Ear: External ear normal.  Mouth/Throat: Oropharynx is clear and moist.  Eyes: Conjunctivae and EOM are normal. Pupils are equal, round, and reactive to light. No scleral icterus.  Neck: Normal range of motion. Neck supple. No JVD present. Carotid bruit is not present. No thyromegaly present.  Cardiovascular: Normal rate, regular rhythm, normal heart sounds and intact distal pulses.  Exam reveals no gallop.   Pulmonary/Chest: Effort normal and breath sounds normal. No respiratory distress. She has no wheezes. She exhibits no tenderness.  Abdominal: Soft. Bowel sounds are normal. She exhibits no distension, no abdominal bruit and no mass. There is no tenderness.  Genitourinary: No breast swelling, tenderness, discharge or bleeding.  Genitourinary Comments: Breast  exam: No mass, nodules, thickening, tenderness, bulging, retraction, inflamation, nipple discharge or skin changes noted.  No axillary or clavicular LA.    Scar on L breast is well healed now -no erythema    Musculoskeletal: Normal range of motion. She exhibits no edema or tenderness.  Lymphadenopathy:    She has no cervical adenopathy.  Neurological: She is alert. She has normal reflexes. No cranial nerve deficit. She exhibits normal muscle tone. Coordination normal.  Skin: Skin is warm and dry. No rash noted. No erythema. No pallor.  Psychiatric: She has a normal mood and affect.          Assessment & Plan:   Problem List Items Addressed This Visit      Cardiovascular and Mediastinum   Essential hypertension, benign - Primary    bp in fair control at this time  BP Readings from Last 1 Encounters:  03/29/16 130/60   No changes needed Disc lifstyle change with low sodium diet and exercise  Labs reviewed         Musculoskeletal and Integument   Osteoporosis    Pt declines further dexa  Hx of leg fx and spinal compression fracture On ca and D Exercise is spotty  On fosamax in the past  D level is 51   Disc safety          Other   Anemia   Relevant Medications   Cyanocobalamin (B-12 COMPLIANCE INJECTION IJ)   B12 deficiency   Colon cancer screening   Ductal carcinoma in situ (DCIS) of left breast    Pt for surg on current scar planned Monday- (there was a pos margin) She is nervous about that  Per pt considering radiation      History of spinal fracture    With OP  Declines further dexa or tx Fosamax in the past  Fall prev discussed  Hyperlipidemia    Disc goals for lipids and reasons to control them Rev labs with pt Rev low sat fat diet in detail  LDL is up a bit  She will try to eat a healthier diet       Vitamin D deficiency    Vitamin D level is therapeutic with current supplementation Disc importance of this to bone and overall  health

## 2016-03-31 NOTE — Assessment & Plan Note (Signed)
Pt declines further dexa  Hx of leg fx and spinal compression fracture On ca and D Exercise is spotty  On fosamax in the past  D level is 51   Disc safety

## 2016-03-31 NOTE — Assessment & Plan Note (Signed)
With OP  Declines further dexa or tx Fosamax in the past  Fall prev discussed

## 2016-03-31 NOTE — Assessment & Plan Note (Signed)
Vitamin D level is therapeutic with current supplementation Disc importance of this to bone and overall health  

## 2016-03-31 NOTE — Assessment & Plan Note (Signed)
bp in fair control at this time  BP Readings from Last 1 Encounters:  03/29/16 130/60   No changes needed Disc lifstyle change with low sodium diet and exercise  Labs reviewed

## 2016-03-31 NOTE — Assessment & Plan Note (Signed)
Pt for surg on current scar planned Monday- (there was a pos margin) She is nervous about that  Per pt considering radiation

## 2016-03-31 NOTE — Assessment & Plan Note (Signed)
Disc goals for lipids and reasons to control them Rev labs with pt Rev low sat fat diet in detail  LDL is up a bit  She will try to eat a healthier diet

## 2016-04-01 ENCOUNTER — Ambulatory Visit (HOSPITAL_BASED_OUTPATIENT_CLINIC_OR_DEPARTMENT_OTHER)
Admission: RE | Admit: 2016-04-01 | Discharge: 2016-04-01 | Disposition: A | Discharge status: Home / Self Care | Payer: Medicare Other | Source: Ambulatory Visit | Attending: General Surgery | Admitting: General Surgery

## 2016-04-01 ENCOUNTER — Ambulatory Visit (HOSPITAL_BASED_OUTPATIENT_CLINIC_OR_DEPARTMENT_OTHER): Payer: Medicare Other | Admitting: Certified Registered"

## 2016-04-01 ENCOUNTER — Encounter (HOSPITAL_BASED_OUTPATIENT_CLINIC_OR_DEPARTMENT_OTHER): Payer: Self-pay | Admitting: Certified Registered"

## 2016-04-01 ENCOUNTER — Encounter (HOSPITAL_BASED_OUTPATIENT_CLINIC_OR_DEPARTMENT_OTHER)
Admission: RE | Disposition: A | Discharge status: Home / Self Care | Payer: Self-pay | Source: Ambulatory Visit | Attending: General Surgery

## 2016-04-01 DIAGNOSIS — I739 Peripheral vascular disease, unspecified: Secondary | ICD-10-CM | POA: Diagnosis not present

## 2016-04-01 DIAGNOSIS — I10 Essential (primary) hypertension: Secondary | ICD-10-CM | POA: Diagnosis not present

## 2016-04-01 DIAGNOSIS — Z888 Allergy status to other drugs, medicaments and biological substances status: Secondary | ICD-10-CM | POA: Insufficient documentation

## 2016-04-01 DIAGNOSIS — Z885 Allergy status to narcotic agent status: Secondary | ICD-10-CM | POA: Diagnosis not present

## 2016-04-01 DIAGNOSIS — Z882 Allergy status to sulfonamides status: Secondary | ICD-10-CM | POA: Insufficient documentation

## 2016-04-01 DIAGNOSIS — Z8249 Family history of ischemic heart disease and other diseases of the circulatory system: Secondary | ICD-10-CM | POA: Diagnosis not present

## 2016-04-01 DIAGNOSIS — Z7982 Long term (current) use of aspirin: Secondary | ICD-10-CM | POA: Insufficient documentation

## 2016-04-01 DIAGNOSIS — Z87891 Personal history of nicotine dependence: Secondary | ICD-10-CM | POA: Diagnosis not present

## 2016-04-01 DIAGNOSIS — M549 Dorsalgia, unspecified: Secondary | ICD-10-CM | POA: Insufficient documentation

## 2016-04-01 DIAGNOSIS — Z79899 Other long term (current) drug therapy: Secondary | ICD-10-CM | POA: Insufficient documentation

## 2016-04-01 DIAGNOSIS — D0512 Intraductal carcinoma in situ of left breast: Secondary | ICD-10-CM | POA: Diagnosis not present

## 2016-04-01 DIAGNOSIS — Z171 Estrogen receptor negative status [ER-]: Secondary | ICD-10-CM | POA: Diagnosis not present

## 2016-04-01 DIAGNOSIS — Z8349 Family history of other endocrine, nutritional and metabolic diseases: Secondary | ICD-10-CM | POA: Insufficient documentation

## 2016-04-01 DIAGNOSIS — C50912 Malignant neoplasm of unspecified site of left female breast: Secondary | ICD-10-CM | POA: Diagnosis present

## 2016-04-01 DIAGNOSIS — Z8261 Family history of arthritis: Secondary | ICD-10-CM | POA: Insufficient documentation

## 2016-04-01 DIAGNOSIS — E785 Hyperlipidemia, unspecified: Secondary | ICD-10-CM | POA: Diagnosis not present

## 2016-04-01 DIAGNOSIS — Z9049 Acquired absence of other specified parts of digestive tract: Secondary | ICD-10-CM | POA: Diagnosis not present

## 2016-04-01 DIAGNOSIS — K219 Gastro-esophageal reflux disease without esophagitis: Secondary | ICD-10-CM | POA: Insufficient documentation

## 2016-04-01 DIAGNOSIS — D649 Anemia, unspecified: Secondary | ICD-10-CM | POA: Diagnosis not present

## 2016-04-01 DIAGNOSIS — I499 Cardiac arrhythmia, unspecified: Secondary | ICD-10-CM | POA: Diagnosis not present

## 2016-04-01 HISTORY — PX: RE-EXCISION OF BREAST LUMPECTOMY: SHX6048

## 2016-04-01 HISTORY — PX: BREAST LUMPECTOMY: SHX2

## 2016-04-01 SURGERY — EXCISION, LESION, BREAST
Anesthesia: General | Site: Breast | Laterality: Left

## 2016-04-01 MED ORDER — CHLORHEXIDINE GLUCONATE CLOTH 2 % EX PADS: 6.0000 | MEDICATED_PAD | Freq: Once | CUTANEOUS | Status: DC

## 2016-04-01 MED ORDER — CELECOXIB 200 MG PO CAPS
ORAL_CAPSULE | ORAL | Status: AC
  Filled 2016-04-01: qty 2

## 2016-04-01 MED ORDER — OXYCODONE HCL 5 MG/5ML PO SOLN
ORAL | Status: AC
  Filled 2016-04-01: qty 5

## 2016-04-01 MED ORDER — ONDANSETRON HCL 4 MG/2ML IJ SOLN
INTRAMUSCULAR | Status: DC | PRN
  Administered 2016-04-01: 4 mg via INTRAVENOUS

## 2016-04-01 MED ORDER — SCOPOLAMINE 1 MG/3DAYS TD PT72: 1.0000 | MEDICATED_PATCH | Freq: Once | TRANSDERMAL | Status: DC | PRN

## 2016-04-01 MED ORDER — FENTANYL CITRATE (PF) 100 MCG/2ML IJ SOLN
50.0000 ug | INTRAMUSCULAR | Status: DC | PRN
  Administered 2016-04-01 (×2): 50 ug via INTRAVENOUS

## 2016-04-01 MED ORDER — LACTATED RINGERS IV SOLN
INTRAVENOUS | Status: DC
  Administered 2016-04-01 (×2): via INTRAVENOUS

## 2016-04-01 MED ORDER — CELECOXIB 400 MG PO CAPS
400.0000 mg | ORAL_CAPSULE | ORAL | Status: AC
Start: 2016-04-01 — End: 2016-04-01
  Administered 2016-04-01: 400 mg via ORAL

## 2016-04-01 MED ORDER — LIDOCAINE HCL (CARDIAC) 20 MG/ML IV SOLN
INTRAVENOUS | Status: DC | PRN
  Administered 2016-04-01: 60 mg via INTRAVENOUS

## 2016-04-01 MED ORDER — MIDAZOLAM HCL 2 MG/2ML IJ SOLN: 1.0000 mg | INTRAMUSCULAR | Status: DC | PRN

## 2016-04-01 MED ORDER — DEXAMETHASONE SODIUM PHOSPHATE 4 MG/ML IJ SOLN
INTRAMUSCULAR | Status: DC | PRN
  Administered 2016-04-01: 10 mg via INTRAVENOUS

## 2016-04-01 MED ORDER — PROMETHAZINE HCL 25 MG/ML IJ SOLN: 6.2500 mg | INTRAMUSCULAR | Status: DC | PRN

## 2016-04-01 MED ORDER — TRAMADOL HCL 50 MG PO TABS: 50.0000 mg | ORAL_TABLET | Freq: Four times a day (QID) | ORAL | 1 refills | Status: DC | PRN

## 2016-04-01 MED ORDER — FENTANYL CITRATE (PF) 100 MCG/2ML IJ SOLN: 25.0000 ug | INTRAMUSCULAR | Status: DC | PRN

## 2016-04-01 MED ORDER — OXYCODONE HCL 5 MG PO TABS: 5.0000 mg | ORAL_TABLET | Freq: Once | ORAL | Status: AC | PRN

## 2016-04-01 MED ORDER — ACETAMINOPHEN 500 MG PO TABS: 1000.0000 mg | ORAL_TABLET | ORAL | Status: DC

## 2016-04-01 MED ORDER — VANCOMYCIN HCL IN DEXTROSE 1-5 GM/200ML-% IV SOLN
1000.0000 mg | INTRAVENOUS | Status: AC
  Administered 2016-04-01: 1000 mg via INTRAVENOUS

## 2016-04-01 MED ORDER — FENTANYL CITRATE (PF) 100 MCG/2ML IJ SOLN
INTRAMUSCULAR | Status: AC
  Filled 2016-04-01: qty 2

## 2016-04-01 MED ORDER — ACETAMINOPHEN 500 MG PO TABS
ORAL_TABLET | ORAL | Status: AC
  Filled 2016-04-01: qty 2

## 2016-04-01 MED ORDER — VANCOMYCIN HCL IN DEXTROSE 1-5 GM/200ML-% IV SOLN
INTRAVENOUS | Status: AC
  Filled 2016-04-01: qty 200

## 2016-04-01 MED ORDER — BUPIVACAINE HCL (PF) 0.25 % IJ SOLN
INTRAMUSCULAR | Status: DC | PRN
  Administered 2016-04-01: 8 mL

## 2016-04-01 MED ORDER — PROPOFOL 10 MG/ML IV BOLUS
INTRAVENOUS | Status: DC | PRN
  Administered 2016-04-01: 100 mg via INTRAVENOUS

## 2016-04-01 MED ORDER — MEPERIDINE HCL 25 MG/ML IJ SOLN: 6.2500 mg | INTRAMUSCULAR | Status: DC | PRN

## 2016-04-01 MED ORDER — OXYCODONE HCL 5 MG/5ML PO SOLN
5.0000 mg | Freq: Once | ORAL | Status: AC | PRN
  Administered 2016-04-01: 5 mg via ORAL

## 2016-04-01 SURGICAL SUPPLY — 39 items
APPLIER CLIP 9.375 MED OPEN (MISCELLANEOUS) ×3
BLADE SURG 15 STRL LF DISP TIS (BLADE) ×1 IMPLANT
BLADE SURG 15 STRL SS (BLADE) ×2
CANISTER SUCT 1200ML W/VALVE (MISCELLANEOUS) ×3 IMPLANT
CHLORAPREP W/TINT 26ML (MISCELLANEOUS) ×3 IMPLANT
CLIP APPLIE 9.375 MED OPEN (MISCELLANEOUS) ×1 IMPLANT
CLIP TI WIDE RED SMALL 6 (CLIP) IMPLANT
COVER BACK TABLE 60X90IN (DRAPES) ×3 IMPLANT
COVER MAYO STAND STRL (DRAPES) ×3 IMPLANT
DECANTER SPIKE VIAL GLASS SM (MISCELLANEOUS) IMPLANT
DERMABOND ADVANCED (GAUZE/BANDAGES/DRESSINGS) ×2
DERMABOND ADVANCED .7 DNX12 (GAUZE/BANDAGES/DRESSINGS) ×1 IMPLANT
DEVICE DUBIN W/COMP PLATE 8390 (MISCELLANEOUS) IMPLANT
DRAPE LAPAROSCOPIC ABDOMINAL (DRAPES) ×3 IMPLANT
DRAPE UTILITY XL STRL (DRAPES) ×3 IMPLANT
ELECT COATED BLADE 2.86 ST (ELECTRODE) ×3 IMPLANT
ELECT REM PT RETURN 9FT ADLT (ELECTROSURGICAL) ×3
ELECTRODE REM PT RTRN 9FT ADLT (ELECTROSURGICAL) ×1 IMPLANT
GLOVE BIO SURGEON STRL SZ7.5 (GLOVE) ×3 IMPLANT
GOWN STRL REUS W/ TWL LRG LVL3 (GOWN DISPOSABLE) ×2 IMPLANT
GOWN STRL REUS W/TWL LRG LVL3 (GOWN DISPOSABLE) ×4
ILLUMINATOR WAVEGUIDE N/F (MISCELLANEOUS) IMPLANT
LIGHT WAVEGUIDE WIDE FLAT (MISCELLANEOUS) IMPLANT
NEEDLE HYPO 25X1 1.5 SAFETY (NEEDLE) ×3 IMPLANT
NS IRRIG 1000ML POUR BTL (IV SOLUTION) ×3 IMPLANT
PACK BASIN DAY SURGERY FS (CUSTOM PROCEDURE TRAY) ×3 IMPLANT
PENCIL BUTTON HOLSTER BLD 10FT (ELECTRODE) ×3 IMPLANT
SLEEVE SCD COMPRESS KNEE MED (MISCELLANEOUS) ×3 IMPLANT
SPONGE LAP 18X18 X RAY DECT (DISPOSABLE) ×3 IMPLANT
STAPLER VISISTAT 35W (STAPLE) IMPLANT
SUT MON AB 4-0 PC3 18 (SUTURE) ×3 IMPLANT
SUT SILK 2 0 SH (SUTURE) ×3 IMPLANT
SUT VICRYL 3-0 CR8 SH (SUTURE) ×3 IMPLANT
SYR CONTROL 10ML LL (SYRINGE) ×3 IMPLANT
TOWEL OR 17X24 6PK STRL BLUE (TOWEL DISPOSABLE) ×3 IMPLANT
TOWEL OR NON WOVEN STRL DISP B (DISPOSABLE) IMPLANT
TUBE CONNECTING 20'X1/4 (TUBING) ×1
TUBE CONNECTING 20X1/4 (TUBING) ×2 IMPLANT
YANKAUER SUCT BULB TIP NO VENT (SUCTIONS) ×3 IMPLANT

## 2016-04-01 NOTE — Anesthesia Postprocedure Evaluation (Signed)
Anesthesia Post Note  Patient: Jenna Young  Procedure(s) Performed: Procedure(s) (LRB): RE-EXCISION OF LEFT BREAST MEDIAL MARGIN (Left)  Patient location during evaluation: PACU Anesthesia Type: General Level of consciousness: sedated and patient cooperative Pain management: pain level controlled Vital Signs Assessment: post-procedure vital signs reviewed and stable Respiratory status: spontaneous breathing Cardiovascular status: stable Anesthetic complications: no       Last Vitals:  Vitals:   04/01/16 1100 04/01/16 1215  BP: (!) 141/64 (!) 148/49  Pulse: 75 70  Resp: 17 20  Temp:  36.5 C    Last Pain:  Vitals:   04/01/16 1215  TempSrc:   PainSc: [place]

## 2016-04-01 NOTE — Anesthesia Preprocedure Evaluation (Signed)
Anesthesia Evaluation  Patient identified by MRN, date of birth, ID band Patient awake    Reviewed: Allergy & Precautions, NPO status , Patient's Chart, lab work & pertinent test results  Airway Mallampati: II  TM Distance: >3 FB Neck ROM: Full    Dental  (+) Dental Advisory Given, Edentulous Upper, Edentulous Lower   Pulmonary former smoker,    Pulmonary exam normal breath sounds clear to auscultation       Cardiovascular hypertension, Pt. on medications + Peripheral Vascular Disease  Normal cardiovascular exam+ Valvular Problems/Murmurs  Rhythm:Regular Rate:Normal  HLD   Neuro/Psych PSYCHIATRIC DISORDERS Anxiety Depression negative neurological ROS     GI/Hepatic Neg liver ROS, GERD  Medicated,  Endo/Other  negative endocrine ROS  Renal/GU negative Renal ROS     Musculoskeletal negative musculoskeletal ROS (+)   Abdominal   Peds  Hematology  (+) Blood dyscrasia, anemia ,   Anesthesia Other Findings Day of surgery medications reviewed with the patient.  DCIS  Reproductive/Obstetrics                             Anesthesia Physical  Anesthesia Plan  ASA: III  Anesthesia Plan: General   Post-op Pain Management:    Induction: Intravenous  Airway Management Planned: LMA  Additional Equipment:   Intra-op Plan:   Post-operative Plan: Extubation in OR  Informed Consent: I have reviewed the patients History and Physical, chart, labs and discussed the procedure including the risks, benefits and alternatives for the proposed anesthesia with the patient or authorized representative who has indicated his/her understanding and acceptance.   Dental advisory given  Plan Discussed with: CRNA  Anesthesia Plan Comments:         Anesthesia Quick Evaluation

## 2016-04-01 NOTE — Transfer of Care (Signed)
Immediate Anesthesia Transfer of Care Note  Patient: Jenna Young  Procedure(s) Performed: Procedure(s): RE-EXCISION OF LEFT BREAST MEDIAL MARGIN (Left)  Patient Location: PACU  Anesthesia Type:General  Level of Consciousness: awake and patient cooperative  Airway & Oxygen Therapy: Patient Spontanous Breathing and Patient connected to face mask oxygen  Post-op Assessment: Report given to RN and Post -op Vital signs reviewed and stable  Post vital signs: Reviewed and stable  Last Vitals:  Vitals:   04/01/16 0928  BP: (!) 153/55  Pulse: 81  Resp: 20  Temp: 36.6 C    Last Pain:  Vitals:   04/01/16 0928  TempSrc: Oral  PainSc: 0-No pain         Complications: No apparent anesthesia complications

## 2016-04-01 NOTE — Anesthesia Procedure Notes (Signed)
Procedure Name: LMA Insertion Date/Time: 04/01/2016 9:52 AM Performed by: Dalonda Simoni D Pre-anesthesia Checklist: Patient identified, Emergency Drugs available, Suction available and Patient being monitored Patient Re-evaluated:Patient Re-evaluated prior to inductionOxygen Delivery Method: Circle system utilized Preoxygenation: Pre-oxygenation with 100% oxygen Intubation Type: IV induction Ventilation: Mask ventilation without difficulty LMA: LMA inserted LMA Size: 4.0 Number of attempts: 1 Airway Equipment and Method: Bite block Placement Confirmation: positive ETCO2 Tube secured with: Tape Dental Injury: Teeth and Oropharynx as per pre-operative assessment

## 2016-04-01 NOTE — H&P (Signed)
Jenna Young  Location: [facility] Patient #: [id number] DOB: 1931/07/29 Widowed / Language: [name] / Race: White Female   History of Present Illness  The patient is a 83 year old female who presents with breast cancer. We are asked to see the patient in consultation by Dr. Loura Pardon to evaluate her for a new left breast cancer. The patient is an 83 year old white female who recently went for a routine screening mammogram. At that time she was found to have a 1.5 cm area of abnormal calcification in the upper outer quadrant of the left breast. This was biopsied and came back as high-grade ductal carcinoma in situ. Her axilla looked negative. She was ER and PR negative. She denies any breast pain or discharge from the nipple. She does note that she has had some recent fainting spells and is currently wearing a heart monitor. Her cardiologist is in the process of changing her antihypertensive medicines   Other Problems  Back Pain  Cholelithiasis  Gastroesophageal Reflux Disease  High blood pressure   Past Surgical History  Breast Biopsy  Bilateral. Cesarean Section - 1  Gallbladder Surgery - Laparoscopic   Diagnostic Studies History  Colonoscopy  1-5 years ago Mammogram  within last year  Allergies  BusPIRone HCl *ANTIANXIETY AGENTS*  Rash. Codeine Phosphate *ANALGESICS - OPIOID*  Nausea. HydroCHLOROthiazide *DIURETICS*  Hives. Lansoprazole *CHEMICALS*  Nausea. PenicillAMINE *MISCELLANEOUS THERAPEUTIC CLASSES*  Nausea. Ramipril *ANTIHYPERTENSIVES*  Hives. Sulfa Antibiotics  Rash.  Medication History  Aspirin (81MG  Tablet, Oral daily) Active. Vitamin D (Cholecalciferol) (1000UNIT Capsule, Oral daily) Active. (2 tablets daily.) Flonase (50MCG/ACT Suspension, Nasal daily) Active. Imdur (30MG  Tablet ER 24HR, Oral daily) Active. Cozaar (100MG  Tablet, Oral daily) Active. Antivert (25MG  Tablet, Oral daily) Active. PriLOSEC (20MG   Capsule DR, Oral daily) Active. Medications Reconciled  Social History  Caffeine use  Coffee, Tea. No alcohol use  No drug use  Tobacco use  Former smoker.  Family History  Arthritis  Mother. Hypertension  Daughter, Father, Mother, Sister. Thyroid problems  Daughter.  Pregnancy / Birth History  Age at menarche  69 years. Age of menopause  59-50 Gravida  2 Maternal age  78-20 Para  2    Review of Systems  General Not Present- Appetite Loss, Chills, Fatigue, Fever, Night Sweats, Weight Gain and Weight Loss. Skin Not Present- Change in Wart/Mole, Dryness, Hives, Jaundice, New Lesions, Non-Healing Wounds, Rash and Ulcer. HEENT Present- Hearing Loss and Wears glasses/contact lenses. Not Present- Earache, Hoarseness, Nose Bleed, Oral Ulcers, Ringing in the Ears, Seasonal Allergies, Sinus Pain, Sore Throat, Visual Disturbances and Yellow Eyes. Respiratory Not Present- Bloody sputum, Chronic Cough, Difficulty Breathing, Snoring and Wheezing. Breast Not Present- Breast Mass, Breast Pain, Nipple Discharge and Skin Changes. Cardiovascular Present- Leg Cramps. Not Present- Chest Pain, Difficulty Breathing Lying Down, Palpitations, Rapid Heart Rate, Shortness of Breath and Swelling of Extremities. Gastrointestinal Not Present- Abdominal Pain, Bloating, Bloody Stool, Change in Bowel Habits, Chronic diarrhea, Constipation, Difficulty Swallowing, Excessive gas, Gets full quickly at meals, Hemorrhoids, Indigestion, Nausea, Rectal Pain and Vomiting. Female Genitourinary Present- Urgency. Not Present- Frequency, Nocturia, Painful Urination and Pelvic Pain. Musculoskeletal Present- Back Pain. Not Present- Joint Pain, Joint Stiffness, Muscle Pain, Muscle Weakness and Swelling of Extremities. Neurological Not Present- Decreased Memory, Fainting, Headaches, Numbness, Seizures, Tingling, Tremor, Trouble walking and Weakness. Psychiatric Not Present- Anxiety, Bipolar, Change in Sleep Pattern,  Depression, Fearful and Frequent crying. Endocrine Not Present- Cold Intolerance, Excessive Hunger, Hair Changes, Heat Intolerance, Hot flashes and New Diabetes.  Vitals Weight: 139.6 lb Height: 61in Body Surface Area: 1.62 m Body Mass Index: 26.38 kg/m  Temp.: 98.27F  Pulse: 100 (Regular)  BP: 124/78 (Sitting, Left Arm, Standard)       Physical Exam  General Mental Status-Alert. General Appearance-Consistent with stated age. Hydration-Well hydrated. Voice-Normal.  Head and Neck Head-normocephalic, atraumatic with no lesions or palpable masses. Trachea-midline. Thyroid Gland Characteristics - normal size and consistency.  Eye Eyeball - Bilateral-Extraocular movements intact. Sclera/Conjunctiva - Bilateral-No scleral icterus.  Chest and Lung Exam Chest and lung exam reveals -quiet, even and easy respiratory effort with no use of accessory muscles and on auscultation, normal breath sounds, no adventitious sounds and normal vocal resonance. Inspection Chest Wall - Normal. Back - normal.  Breast Note: There is no palpable mass in either breast. There is no palpable axillary, supraclavicular, or cervical lymphadenopathy. There is a small upper outer quadrant left breast incision that is very hard to detect from a previous benign biopsy   Cardiovascular Cardiovascular examination reveals -normal heart sounds, regular rate and rhythm with no murmurs and normal pedal pulses bilaterally.  Abdomen Inspection Inspection of the abdomen reveals - No Hernias. Skin - Scar - no surgical scars. Palpation/Percussion Palpation and Percussion of the abdomen reveal - Soft, Non Tender, No Rebound tenderness, No Rigidity (guarding) and No hepatosplenomegaly. Auscultation Auscultation of the abdomen reveals - Bowel sounds normal.  Neurologic Neurologic evaluation reveals -alert and oriented x 3 with no impairment of recent or remote memory. Mental  Status-Normal.  Musculoskeletal Normal Exam - Left-Upper Extremity Strength Normal and Lower Extremity Strength Normal. Normal Exam - Right-Upper Extremity Strength Normal and Lower Extremity Strength Normal.  Lymphatic Head & Neck  General Head & Neck Lymphatics: Bilateral - Description - Normal. Axillary  General Axillary Region: Bilateral - Description - Normal. Tenderness - Non Tender. Femoral & Inguinal  Generalized Femoral & Inguinal Lymphatics: Bilateral - Description - Normal. Tenderness - Non Tender.    Assessment & Plan  DUCTAL CARCINOMA IN SITU (DCIS) OF LEFT BREAST (D05.12) Impression: The patient appears to have a small area of DCIS in the upper outer left breast. I have talked to her in detail about the different options for treatment and at this point she favors breast conservation. I think this is a very reasonable way of treating her cancer. I would plan for a left breast radioactive seed or wire localized lumpectomy. I have discussed with her in detail the risks and benefits of the operation as well as some of the technical aspects and she understands and wishes to proceed. She is currently wearing a heart monitor and we will get cardiac clearance from her cardiologist, Dr. Rockey Situ, prior to scheduling surgery.  She has had surgery. She has a positive medial margin. She is for re excision of the margin

## 2016-04-01 NOTE — Interval H&P Note (Signed)
History and Physical Interval Note:  04/01/2016 9:23 AM  Jenna Young  has presented today for surgery, with the diagnosis of LEFT BREAST CANCER  The various methods of treatment have been discussed with the patient and family. After consideration of risks, benefits and other options for treatment, the patient has consented to  Procedure(s): RE-EXCISION OF LEFT BREAST MEDIAL MARGIN (Left) as a surgical intervention .  The patient's history has been reviewed, patient examined, no change in status, stable for surgery.  I have reviewed the patient's chart and labs.  Questions were answered to the patient's satisfaction.     TOTH III,Jayma Volpi S

## 2016-04-01 NOTE — Op Note (Signed)
04/01/2016  10:23 AM  PATIENT:  Jenna Young  83 y.o. female  PRE-OPERATIVE DIAGNOSIS:  LEFT BREAST CANCER  POST-OPERATIVE DIAGNOSIS:  LEFT BREAST CANCER  PROCEDURE:  Procedure(s): RE-EXCISION OF LEFT BREAST MEDIAL MARGIN (Left)  SURGEON:  Surgeon(s) and Role:    * Jovita Kussmaul, MD - Primary  PHYSICIAN ASSISTANT:   ASSISTANTS: none   ANESTHESIA:   local and general  EBL:  Total I/O In: 800 [I.V.:800] Out: -   BLOOD ADMINISTERED:none  DRAINS: none   LOCAL MEDICATIONS USED:  MARCAINE     SPECIMEN:  Source of Specimen:  left breast medial margin  DISPOSITION OF SPECIMEN:  PATHOLOGY  COUNTS:  YES  TOURNIQUET:  * No tourniquets in log *  DICTATION: .Dragon Dictation   After informed consent was obtained the patient was brought to the operating room and placed in the supine position on the operating room table. After adequate induction of general anesthesia the patient's left breast was prepped with ChloraPrep, allowed to dry, and draped in usual sterile manner. An appropriate timeout was performed. The patient previously underwent a left breast lumpectomy for ductal carcinoma in situ and she has a positive medial margin. The area around the previous incision was infiltrated with quarter percent Marcaine. A small incision was made through her previous incision with the 15 blade knife. The incision was carried through the skin and subcutaneous tissue sharply with the 15 blade knife until the lumpectomy cavity was entered. The fluid was evacuated. The medial edge of the cavity was excised sharply with electrocautery. Once the specimen was removed it was oriented with a stitch on the new true surgical margin. The specimen was then sent to pathology for further evaluation. The medial part the cavity was marked with a clip. Hemostasis was achieved using the Bovie electrocautery. The deep layer of the wound was then closed with interrupted 3-0 Vicryl stitches. The skin was enclosed  with a running 4-0 Monocryl subcuticular stitch. Dermabond dressings were applied. The patient tolerated the procedure well. At the end of the case all needle sponge and instrument counts were correct. The patient was then awakened and taken to recovery in stable condition.  PLAN OF CARE: Discharge to home after PACU  PATIENT DISPOSITION:  PACU - hemodynamically stable.   Delay start of Pharmacological VTE agent (>24hrs) due to surgical blood loss or risk of bleeding: not applicable

## 2016-04-01 NOTE — Discharge Instructions (Signed)
°  Post Anesthesia Home Care Instructions ° °Activity: °Get plenty of rest for the remainder of the day. A responsible adult should stay with you for 24 hours following the procedure.  °For the next 24 hours, DO NOT: °-Drive a car °-Operate machinery °-Drink alcoholic beverages °-Take any medication unless instructed by your physician °-Make any legal decisions or sign important papers. ° °Meals: °Start with liquid foods such as gelatin or soup. Progress to regular foods as tolerated. Avoid greasy, spicy, heavy foods. If nausea and/or vomiting occur, drink only clear liquids until the nausea and/or vomiting subsides. Call your physician if vomiting continues. ° °Special Instructions/Symptoms: °Your throat may feel dry or sore from the anesthesia or the breathing tube placed in your throat during surgery. If this causes discomfort, gargle with warm salt water. The discomfort should disappear within 24 hours. ° °If you had a scopolamine patch placed behind your ear for the management of post- operative nausea and/or vomiting: ° °1. The medication in the patch is effective for 72 hours, after which it should be removed.  Wrap patch in a tissue and discard in the trash. Wash hands thoroughly with soap and water. °2. You may remove the patch earlier than 72 hours if you experience unpleasant side effects which may include dry mouth, dizziness or visual disturbances. °3. Avoid touching the patch. Wash your hands with soap and water after contact with the patch. °  ° °Call your surgeon if you experience:  ° °1.  Fever over 101.0. °2.  Inability to urinate. °3.  Nausea and/or vomiting. °4.  Extreme swelling or bruising at the surgical site. °5.  Continued bleeding from the incision. °6.  Increased pain, redness or drainage from the incision. °7.  Problems related to your pain medication. °8.  Any problems and/or concerns ° °

## 2016-04-02 ENCOUNTER — Encounter (HOSPITAL_BASED_OUTPATIENT_CLINIC_OR_DEPARTMENT_OTHER): Payer: Self-pay | Admitting: General Surgery

## 2016-04-12 NOTE — Progress Notes (Signed)
Location of Breast Cancer: Left Breast  Histology per Pathology Report: 01/26/16  Diagnosis Breast, left, needle core biopsy, upper - DUCTAL CARCINOMA IN SITU WITH ASSOCIATED NECROSIS AND CALCIFICATION.  Receptor Status: ER(NEG), PR (NEG)  02/27/16 Diagnosis 1. Breast, lumpectomy, Left - DUCTAL CARCINOMA IN SITU, 3 CM. - DUCTAL CARCINOMA IN SITU FOCALLY INVOLVES SUPERIOR AND MEDIAL MARGINS. - DUCTAL CARCINOMA IN SITU FOCALLY 0.2 CM FROM INFERIOR MARGIN. - FIBROCYSTIC CHANGES WITH SCLEROSING ADENOSIS. - PREVIOUS BIOPSY SITE. 2. Breast, excision, Left additional superior margin - DUCTAL CARCINOMA IN SITU, 1 CM. - DUCTAL CARCINOMA IN SITU FOCALLY 0.4 CM FROM FINAL SUPERIOR MARGIN.    Did patient present with symptoms or was this found on screening mammography?: It was found on a screening mammogram.   Past/Anticipated interventions by surgeon, if any: 02/27/16 PROCEDURE:  Procedure(s): LEFT BREAST LUMPECTOMY WITH RADIOACTIVE SEED LOCALIZATION (Left) SURGEON:  Surgeon(s) and Role:    * Jovita Kussmaul, MD - Primary  04/01/16 PROCEDURE:  Procedure(s): RE-EXCISION OF LEFT BREAST MEDIAL MARGIN (Left) SURGEON:  Surgeon(s) and Role:    * Jovita Kussmaul, MD - Primary  Past/Anticipated interventions by medical oncology, if any:  Dr. Burr Medico 02/26/16 Plan -She will have lumpectomy tomorrow (completed 02/27/16) -If her surgical path does not reveal invasive cancer, she probably no need to see me back. She will follow-up with her primary care physician for breast cancer surveillance -She will see Dr. Isidore Moos after her breast surgery   Lymphedema issues, if any:  She denies. She has good Left arm movement.   Pain issues, if any:  She denies. She does have some soreness to her incision site.   SAFETY ISSUES:  Prior radiation? No  Pacemaker/ICD? No  Possible current pregnancy? No  Is the patient on methotrexate? No  Current Complaints / other details:    BP (!) 142/74   Pulse  93   Temp 98.2 F (36.8 C)   Ht 5\' 1"  (1.549 m)   Wt 142 lb 3.2 oz (64.5 kg)   SpO2 97% Comment: room air  BMI 26.87 kg/m    Wt Readings from Last 3 Encounters:  04/17/16 142 lb 3.2 oz (64.5 kg)  04/01/16 141 lb (64 kg)  03/29/16 141 lb (64 kg)      Pleasant Britz, Stephani Police, RN 04/12/2016,1:42 PM

## 2016-04-14 ENCOUNTER — Other Ambulatory Visit: Payer: Self-pay | Admitting: Physician Assistant

## 2016-04-16 DIAGNOSIS — I10 Essential (primary) hypertension: Secondary | ICD-10-CM | POA: Diagnosis not present

## 2016-04-16 DIAGNOSIS — H2513 Age-related nuclear cataract, bilateral: Secondary | ICD-10-CM | POA: Diagnosis not present

## 2016-04-16 DIAGNOSIS — H52223 Regular astigmatism, bilateral: Secondary | ICD-10-CM | POA: Diagnosis not present

## 2016-04-16 DIAGNOSIS — H5213 Myopia, bilateral: Secondary | ICD-10-CM | POA: Diagnosis not present

## 2016-04-17 ENCOUNTER — Ambulatory Visit
Admission: RE | Admit: 2016-04-17 | Discharge: 2016-04-17 | Disposition: A | Discharge status: Home / Self Care | Payer: Medicare Other | Source: Ambulatory Visit | Attending: Radiation Oncology | Admitting: Radiation Oncology

## 2016-04-17 ENCOUNTER — Encounter: Payer: Self-pay | Admitting: Radiation Oncology

## 2016-04-17 DIAGNOSIS — Z9889 Other specified postprocedural states: Secondary | ICD-10-CM | POA: Diagnosis not present

## 2016-04-17 DIAGNOSIS — Z171 Estrogen receptor negative status [ER-]: Secondary | ICD-10-CM | POA: Diagnosis not present

## 2016-04-17 DIAGNOSIS — C50112 Malignant neoplasm of central portion of left female breast: Secondary | ICD-10-CM

## 2016-04-17 DIAGNOSIS — D0512 Intraductal carcinoma in situ of left breast: Secondary | ICD-10-CM

## 2016-04-17 DIAGNOSIS — Z510 Encounter for antineoplastic radiation therapy: Secondary | ICD-10-CM | POA: Diagnosis not present

## 2016-04-17 NOTE — Progress Notes (Addendum)
Radiation Oncology         [phone] ________________________________  Initial Outpatient Consultation  Name: Jenna Young MRN: [id number]  Date: 04/17/2016  DOB: October 21, 1931  CC:Roxy Manns, MD  Griselda Miner, MD   REFERRING PHYSICIAN: Griselda Miner, MD  DIAGNOSIS: [phone]   ICD-9-CM ICD-10-CM   1. Ductal carcinoma in situ (DCIS) of left breast 233.0 D05.12   2. 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  CHIEF COMPLAINT: Here to discuss management of Left breast cancer  HISTORY OF PRESENT ILLNESS::Jenna Young is a 83 y.o. female who presented with abnormality on routine mammogram. Subsequent diagnostic mammogram showed a persistent focal asymmetry in the left upper central breast with associated indeterminate microcalcifications across an area measuring 1.5 x 1.3 x 0.9 cm. Ultrasound was negative for left axillary lymphadenopathy.  Biopsy on 01/26/16 showed ductal carcinoma in situ with characteristics as described above in the diagnosis.  The patient underwent left lumpectomy with radioactive seed localization on 02/27/16 with Dr. Carolynne Edouard, revealing ductal carcinoma in situ spanning 3 cm, focally involving superior and medial margins as well as fibrocystic changes with sclerosing adenosis. The patient also underwent left breast excision of the left superior margin revealing DCIS spanning 1 cm on the same day. On 04/01/16 the patient underwent re-excision of the left breast medial margin clearing another 0.1cm of DCIS with close but negative margins by <0.1cm.  The patient reports to the clinic today to discuss the role that radiation may play in the treatment of her disease. She is accompanied by family today.  On review of systems, the patient denies lymphedema  issues at this time. She denies pain other than some soreness to her incision site. She reports concern about tolerating radiation treatments because of a swelling reaction to sun exposure, which she reports she has experienced throughout her life.  PREVIOUS RADIATION THERAPY: No  PAST MEDICAL HISTORY:  has a past medical history of Allergic rhinitis; Anemia; Anxiety; GERD (gastroesophageal reflux disease); Hyperlipidemia; Hypertension; Osteopenia; and Osteoporosis.    PAST SURGICAL HISTORY: Past Surgical History:  Procedure Laterality Date  . APPENDECTOMY    . BREAST LUMPECTOMY WITH RADIOACTIVE SEED LOCALIZATION Left 02/27/2016   Procedure: LEFT BREAST LUMPECTOMY WITH RADIOACTIVE SEED LOCALIZATION;  Surgeon: Chevis Pretty III, MD;  Location: [facility];  Service: General;  Laterality: Left;  . BREAST SURGERY  10/10   benign biopsy  negative  . CESAREAN SECTION    . CHOLECYSTECTOMY    . FEMUR FRACTURE SURGERY Right 2006  . OVARIAN CYST SURGERY    . RE-EXCISION OF BREAST LUMPECTOMY Left 04/01/2016   Procedure: RE-EXCISION OF LEFT BREAST MEDIAL MARGIN;  Surgeon: Chevis Pretty III, MD;  Location: [facility];  Service: General;  Laterality: Left;    FAMILY HISTORY: family history includes Cancer in her maternal aunt; Diabetes in her maternal aunt; Heart disease in her father; Hypertension in her mother; Macular degeneration in her sister.  SOCIAL HISTORY:  reports that she quit smoking about 48 years ago. She has never used smokeless tobacco. She reports that she does not drink alcohol or use drugs. The patient lives in [place], very close to [facility].  ALLERGIES: Buspirone hcl; Codeine; Hydrochlorothiazide; Lansoprazole; Minocycline hcl; Penicillins; Ramipril; and Sulfa antibiotics  MEDICATIONS:  Current Outpatient Prescriptions  Medication Sig Dispense Refill  . aspirin (ASPIRIN EC) 81 MG EC tablet Take 81 mg by mouth daily. Swallow whole.    . calcium-vitamin  D (OSCAL WITH D) 500-200 MG-UNIT per tablet Take 2 tablets by mouth daily.      . Cholecalciferol (VITAMIN D) 2000 UNITS CAPS Take 1 capsule by mouth daily.      . Cyanocobalamin (B-12 COMPLIANCE INJECTION IJ) Inject as directed.    . isosorbide mononitrate (IMDUR) 30 MG 24 hr tablet Take 30 mg by mouth daily.    Marland Kitchen losartan (COZAAR) 100 MG tablet TAKE 1 TABLET BY MOUTH EVERY DAY 30 tablet 11  . omeprazole (PRILOSEC) 20 MG capsule TAKE 1 CAPSULE BY MOUTH EVERY DAY 30 capsule 5  . amLODipine (NORVASC) 5 MG tablet Take 1 tablet (5 mg total) by mouth daily as needed (only for high bnlood pressure). 90 tablet 3  . traMADol (ULTRAM) 50 MG tablet Take 1-2 tablets (50-100 mg total) by mouth every 6 (six) hours as needed. (Patient not taking: Reported on 04/17/2016) 30 tablet 1   No current facility-administered medications for this encounter.     REVIEW OF SYSTEMS: as above   PHYSICAL EXAM:  height is '5\' 1"'$  (1.549 m) and weight is 142 lb 3.2 oz (64.5 kg). Her temperature is 98.2 F (36.8 C). Her blood pressure is 142/74 (abnormal) and her pulse is 93. Her oxygen saturation is 97%.   General: Alert and oriented, in no acute distress HEENT: Extraocular movements are intact. Oropharynx is clear. Neck: Neck is supple, no palpable cervical or supraclavicular lymphadenopathy or masses. Heart: Systolic murmur throughout the precordium that is loudest at the aortic region. RRR Chest: Clear to auscultation bilaterally. Extremities: No edema in upper extremities. Lymphatics: see Neck Exam Skin: No concerning lesions. No rashes over the left breast. Breasts: On examination of the left breast the lumpectomy scar just superior to the left nipple is well healed. No significant swelling.   ECOG = 1  0 - Asymptomatic (Fully active, able to carry on all predisease activities without restriction)  1 - Symptomatic but completely ambulatory (Restricted in physically strenuous activity but ambulatory and able to  carry out work of a light or sedentary nature. For example, light housework, office work)  2 - Symptomatic, <50% in bed during the day (Ambulatory and capable of all self care but unable to carry out any work activities. Up and about more than 50% of waking hours)  3 - Symptomatic, >50% in bed, but not bedbound (Capable of only limited self-care, confined to bed or chair 50% or more of waking hours)  4 - Bedbound (Completely disabled. Cannot carry on any self-care. Totally confined to bed or chair)  5 - Death   Eustace Pen MM, Creech RH, Tormey DC, et al. [phone]). "Toxicity and response criteria of the [facility]". [place] Oncol. 5 (6): 649-55   LABORATORY DATA:  Lab Results  Component Value Date   WBC 6.4 03/21/2016   HGB 11.0 (L) 03/21/2016   HCT 32.7 (L) 03/21/2016   MCV 78.9 03/21/2016   PLT 312.0 03/21/2016   CMP     Component Value Date/Time   NA 139 03/21/2016 0906   NA CANCELED 01/03/2016 1549   NA 136 10/22/2012 1914   K 4.4 03/21/2016 0906   K 3.8 10/22/2012 1914   CL 104 03/21/2016 0906   CL 103 10/22/2012 1914   CO2 28 03/21/2016 0906  CO2 27 10/22/2012 1914   GLUCOSE 98 03/21/2016 0906   GLUCOSE 106 (H) 10/22/2012 1914   BUN 12 03/21/2016 0906   BUN CANCELED 01/03/2016 1549   BUN 13 10/22/2012 1914   CREATININE 0.94 03/21/2016 0906   CREATININE 0.95 10/22/2012 1914   CALCIUM 9.7 03/21/2016 0906   CALCIUM 9.1 10/22/2012 1914   PROT 8.0 03/21/2016 0906   PROT 8.7 (H) 10/22/2012 1914   ALBUMIN 3.9 03/21/2016 0906   ALBUMIN 3.9 10/22/2012 1914   AST 15 03/21/2016 0906   AST 20 10/22/2012 1914   ALT 10 03/21/2016 0906   ALT 20 10/22/2012 1914   ALKPHOS 90 03/21/2016 0906   ALKPHOS 85 10/22/2012 1914   BILITOT 0.2 03/21/2016 0906   BILITOT 0.3 10/22/2012 1914   GFRNONAA 52 (L) 01/14/2016 1655   GFRNONAA 57 (L) 10/22/2012 1914   GFRAA 60 (L) 01/14/2016 1655   GFRAA >60 10/22/2012 1914         RADIOGRAPHY: as above      IMPRESSION/PLAN: Stage 0 (TisN0M0) Left Breast UIQ Invasive Ductal Carcinoma, ER Negative / PR Negative / Her2 Negative, High Grade, status post left lumpectomy.  No plans for further excision per discussion with Dr Marlou Starks at tumor board. I also asked radiology if they recommend a pre-radiotherapy mammogram - they do not for this case.  It was a pleasure meeting the patient today. We discussed the risks, benefits, and side effects of radiotherapy. I recommend radiotherapy to the Left breast to reduce her risk of locoregional recurrence by 1/2.Marland Kitchen  We discussed that radiation would take approximately 4 weeks (3 weeks to breast, then a 1 week lumpectomy boost)  to complete and that I would give the patient 1-2 more weeks to heal following surgery before starting treatment.  We spoke about acute effects including skin irritation and fatigue as well as much less common late effects including internal organ injury or irritation. We spoke about the latest technology that is used to minimize the risk of late effects for patients undergoing radiotherapy to the breast or chest wall. No guarantees of treatment were given. The patient is enthusiastic about proceeding with treatment. A consent form was discussed, signed, and placed in the patient's chart. She is scheduled for CT Simulation on 04/19/16 at 11 am. I anticipate medically necessary 3D conformal RT to spare heart and lungs, with at least 2 fields with MLCs to block normal tissues, and vaclock for immobilization.  We discussed the option for the patient to receive radiation therapy at [facility] with Dr. Baruch Gouty, which is much closer to her home, however she is not interested in this.  I spent 45 minutes face to face with patient, over 50% on counseling and coordination of care. __________________________________________   Eppie Gibson, MD  This document serves as a record of services personally performed by Eppie Gibson, MD. It was  created on her behalf by Maryla Morrow, a trained medical scribe. The creation of this record is based on the scribe's personal observations and the provider's statements to them. This document has been checked and approved by the attending provider.

## 2016-04-19 ENCOUNTER — Ambulatory Visit
Admission: RE | Admit: 2016-04-19 | Discharge: 2016-04-19 | Disposition: A | Discharge status: Home / Self Care | Payer: Medicare Other | Source: Ambulatory Visit | Attending: Radiation Oncology | Admitting: Radiation Oncology

## 2016-04-19 DIAGNOSIS — Z171 Estrogen receptor negative status [ER-]: Secondary | ICD-10-CM

## 2016-04-19 DIAGNOSIS — C50112 Malignant neoplasm of central portion of left female breast: Secondary | ICD-10-CM

## 2016-04-19 DIAGNOSIS — Z510 Encounter for antineoplastic radiation therapy: Secondary | ICD-10-CM | POA: Diagnosis not present

## 2016-04-22 NOTE — Progress Notes (Signed)
  Radiation Oncology         [phone] ________________________________  Name: Jenna Young MRN: [id number]  Date: 04/19/2016  DOB: Aug 08, 1931  SIMULATION AND TREATMENT PLANNING NOTE    Outpatient  DIAGNOSIS:     ICD-9-CM ICD-10-CM   1. Carcinoma of central portion of left breast in female, estrogen receptor negative (HCC) 174.1 C50.112    V86.1 Z17.1     NARRATIVE:  The patient was brought to the [facility].  Identity was confirmed.  All relevant records and images related to the planned course of therapy were reviewed.  The patient freely provided informed written consent to proceed with treatment after reviewing the details related to the planned course of therapy. The consent form was witnessed and verified by the simulation staff.    Then, the patient was set-up in a stable reproducible supine position for radiation therapy with her ipsilateral arm over her head, and her upper body secured in a custom-made Vac-lok device.  CT images were obtained.  Surface markings were placed.  The CT images were loaded into the planning software.    TREATMENT PLANNING NOTE: Treatment planning then occurred.  The radiation prescription was entered and confirmed.     A total of 3 medically necessary complex treatment devices were fabricated and supervised by me: 2 fields with MLCs for custom blocks to protect heart, and lungs;  and, a Vac-lok. MORE COMPLEX DEVICES MAY BE MADE IN DOSIMETRY FOR FIELD IN FIELD BEAMS FOR DOSE HOMOGENEITY.  I have requested : 3D Simulation which is medically necessary to give adequate dose to at risk tissues while sparing lungs and heart.  I have requested a DVH of the following structures: lungs, heart, lumpectomy cavity.    The patient will receive 40.05 Gy in 15 fractions to the left breast with 2 tangential fields.  This will be followed by a boost.  Optical Surface Tracking Plan:  Since intensity modulated radiotherapy (IMRT) and 3D conformal  radiation treatment methods are predicated on accurate and precise positioning for treatment, intrafraction motion monitoring is medically necessary to ensure accurate and safe treatment delivery. The ability to quantify intrafraction motion without excessive ionizing radiation dose can only be performed with optical surface tracking. Accordingly, surface imaging offers the opportunity to obtain 3D measurements of patient position throughout IMRT and 3D treatments without excessive radiation exposure. I am ordering optical surface tracking for this patient's upcoming course of radiotherapy.  ________________________________   Reference:  Ursula Alert, J, et al. Surface imaging-based analysis of intrafraction motion for breast radiotherapy patients.Journal of [place], n. 6, nov. 2014. ISSN [id number].  Available at: <http://www.jacmp.org/index.php/jacmp/article/view/4957>.    -----------------------------------  Eppie Gibson, MD

## 2016-04-23 DIAGNOSIS — Z510 Encounter for antineoplastic radiation therapy: Secondary | ICD-10-CM | POA: Diagnosis not present

## 2016-04-23 DIAGNOSIS — C50112 Malignant neoplasm of central portion of left female breast: Secondary | ICD-10-CM | POA: Diagnosis not present

## 2016-04-23 DIAGNOSIS — Z171 Estrogen receptor negative status [ER-]: Secondary | ICD-10-CM | POA: Diagnosis not present

## 2016-04-26 ENCOUNTER — Ambulatory Visit
Admission: RE | Admit: 2016-04-26 | Discharge: 2016-04-26 | Disposition: A | Discharge status: Home / Self Care | Payer: Medicare Other | Source: Ambulatory Visit | Attending: Radiation Oncology | Admitting: Radiation Oncology

## 2016-04-26 DIAGNOSIS — Z171 Estrogen receptor negative status [ER-]: Secondary | ICD-10-CM | POA: Diagnosis not present

## 2016-04-26 DIAGNOSIS — C50112 Malignant neoplasm of central portion of left female breast: Secondary | ICD-10-CM | POA: Diagnosis not present

## 2016-04-26 DIAGNOSIS — Z510 Encounter for antineoplastic radiation therapy: Secondary | ICD-10-CM | POA: Diagnosis not present

## 2016-04-29 ENCOUNTER — Ambulatory Visit
Admission: RE | Admit: 2016-04-29 | Discharge: 2016-04-29 | Disposition: A | Discharge status: Home / Self Care | Payer: Medicare Other | Source: Ambulatory Visit | Attending: Radiation Oncology | Admitting: Radiation Oncology

## 2016-04-29 DIAGNOSIS — Z171 Estrogen receptor negative status [ER-]: Secondary | ICD-10-CM | POA: Diagnosis not present

## 2016-04-29 DIAGNOSIS — Z510 Encounter for antineoplastic radiation therapy: Secondary | ICD-10-CM | POA: Diagnosis not present

## 2016-04-29 DIAGNOSIS — C50112 Malignant neoplasm of central portion of left female breast: Secondary | ICD-10-CM | POA: Diagnosis not present

## 2016-04-30 ENCOUNTER — Ambulatory Visit
Admission: RE | Admit: 2016-04-30 | Discharge: 2016-04-30 | Disposition: A | Discharge status: Home / Self Care | Payer: Medicare Other | Source: Ambulatory Visit | Attending: Radiation Oncology | Admitting: Radiation Oncology

## 2016-04-30 ENCOUNTER — Encounter: Payer: Self-pay | Admitting: Radiation Oncology

## 2016-04-30 VITALS — BP 150/64 | HR 89 | Temp 98.2°F | Ht 61.0 in | Wt 142.6 lb

## 2016-04-30 DIAGNOSIS — C50112 Malignant neoplasm of central portion of left female breast: Secondary | ICD-10-CM | POA: Diagnosis not present

## 2016-04-30 DIAGNOSIS — Z171 Estrogen receptor negative status [ER-]: Secondary | ICD-10-CM

## 2016-04-30 DIAGNOSIS — Z510 Encounter for antineoplastic radiation therapy: Secondary | ICD-10-CM | POA: Diagnosis not present

## 2016-04-30 MED ORDER — ALRA NON-METALLIC DEODORANT (RAD-ONC)
1.0000 "application " | Freq: Once | TOPICAL | Status: AC
  Administered 2016-04-30: 1 via TOPICAL

## 2016-04-30 MED ORDER — RADIAPLEXRX EX GEL
Freq: Once | CUTANEOUS | Status: AC
  Administered 2016-04-30: 13:00:00 via TOPICAL

## 2016-04-30 NOTE — Progress Notes (Signed)
Department of Radiation Oncology  Phone:  [phone] Fax:        [phone]  Weekly Treatment Note    Name: [name] Date: 04/30/2016 MRN: [id number] DOB: 08-19-1931   Diagnosis:     ICD-9-CM ICD-10-CM   1. Carcinoma of central portion of left breast in female, estrogen receptor negative (HCC) 174.1 C50.112    V86.1 Z17.1      Current dose: 5.34 Gy  Current fraction: 2   MEDICATIONS: Current Outpatient Prescriptions  Medication Sig Dispense Refill  . amLODipine (NORVASC) 5 MG tablet Take 1 tablet (5 mg total) by mouth daily as needed (only for high bnlood pressure). 90 tablet 3  . aspirin (ASPIRIN EC) 81 MG EC tablet Take 81 mg by mouth daily. Swallow whole.    . calcium-vitamin D (OSCAL WITH D) 500-200 MG-UNIT per tablet Take 2 tablets by mouth daily.      . Cholecalciferol (VITAMIN D) 2000 UNITS CAPS Take 1 capsule by mouth daily.      . Cyanocobalamin (B-12 COMPLIANCE INJECTION IJ) Inject as directed.    . isosorbide mononitrate (IMDUR) 30 MG 24 hr tablet Take 30 mg by mouth daily.    Marland Kitchen losartan (COZAAR) 100 MG tablet TAKE 1 TABLET BY MOUTH EVERY DAY 30 tablet 11  . omeprazole (PRILOSEC) 20 MG capsule TAKE 1 CAPSULE BY MOUTH EVERY DAY 30 capsule 5  . traMADol (ULTRAM) 50 MG tablet Take 1-2 tablets (50-100 mg total) by mouth every 6 (six) hours as needed. (Patient not taking: Reported on 04/17/2016) 30 tablet 1   No current facility-administered medications for this encounter.    Facility-Administered Medications Ordered in Other Encounters  Medication Dose Route Frequency Provider Last Rate Last Dose  . hyaluronate sodium (RADIAPLEXRX) gel   Topical Once Eppie Gibson, MD      . non-metallic deodorant Jethro Poling) 1 application  1 application Topical Once Eppie Gibson, MD         ALLERGIES: Buspirone hcl; Codeine; Hydrochlorothiazide; Lansoprazole; Minocycline hcl; Penicillins; Ramipril; and Sulfa antibiotics   LABORATORY DATA:  Lab Results  Component Value  Date   WBC 6.4 03/21/2016   HGB 11.0 (L) 03/21/2016   HCT 32.7 (L) 03/21/2016   MCV 78.9 03/21/2016   PLT 312.0 03/21/2016   Lab Results  Component Value Date   NA 139 03/21/2016   K 4.4 03/21/2016   CL 104 03/21/2016   CO2 28 03/21/2016   Lab Results  Component Value Date   ALT 10 03/21/2016   AST 15 03/21/2016   ALKPHOS 90 03/21/2016   BILITOT 0.2 03/21/2016     NARRATIVE: Jenna Young was seen today for weekly treatment management. The chart was checked and the patient's films were reviewed.  The patient is doing very well after her first couple of fractions. We reviewed some of the logistics of treatment and she knows to let staff know if she has any issues which arise. Dr. Isidore Moos will plan to see her early next week.  Jenna Young presents for her 2nd fraction of radiation to her Left Breast. She denies pain. She denies fatigue. She reports that she noticed some swelling yesterday after radiation but it has improved today. She was provided with education today and given Radiaplex and Alra to begin using at home as directed.   BP (!) 150/64   Pulse 89   Temp 98.2 F (36.8 C)   Ht 5\' 1"  (1.549 m)   Wt 142 lb 9.6 oz (64.7 kg)  SpO2 100% Comment: room air  BMI 26.94 kg/m    Wt Readings from Last 3 Encounters:  04/30/16 142 lb 9.6 oz (64.7 kg)  04/17/16 142 lb 3.2 oz (64.5 kg)  04/01/16 141 lb (64 kg)    PHYSICAL EXAMINATION: height is 5\' 1"  (1.549 m) and weight is 142 lb 9.6 oz (64.7 kg). Her temperature is 98.2 F (36.8 C). Her blood pressure is 150/64 (abnormal) and her pulse is 89. Her oxygen saturation is 100%.        ASSESSMENT: The patient is doing satisfactorily with treatment.  PLAN: We will continue with the patient's radiation treatment as planned.

## 2016-04-30 NOTE — Progress Notes (Signed)

## 2016-04-30 NOTE — Progress Notes (Signed)
Ms. Calender presents for her 2nd fraction of radiation to her Left Breast. She denies pain. She denies fatigue. She reports that she noticed some swelling yesterday after radiation but it has improved today. She was provided with education today and given Radiaplex and Alra to begin using at home as directed.   BP (!) 150/64   Pulse 89   Temp 98.2 F (36.8 C)   Ht 5\' 1"  (1.549 m)   Wt 142 lb 9.6 oz (64.7 kg)   SpO2 100% Comment: room air  BMI 26.94 kg/m    Wt Readings from Last 3 Encounters:  04/30/16 142 lb 9.6 oz (64.7 kg)  04/17/16 142 lb 3.2 oz (64.5 kg)  04/01/16 141 lb (64 kg)

## 2016-05-01 ENCOUNTER — Ambulatory Visit
Admission: RE | Admit: 2016-05-01 | Discharge: 2016-05-01 | Disposition: A | Discharge status: Home / Self Care | Payer: Medicare Other | Source: Ambulatory Visit | Attending: Radiation Oncology | Admitting: Radiation Oncology

## 2016-05-01 DIAGNOSIS — Z171 Estrogen receptor negative status [ER-]: Secondary | ICD-10-CM | POA: Diagnosis not present

## 2016-05-01 DIAGNOSIS — C50112 Malignant neoplasm of central portion of left female breast: Secondary | ICD-10-CM | POA: Diagnosis not present

## 2016-05-01 DIAGNOSIS — Z510 Encounter for antineoplastic radiation therapy: Secondary | ICD-10-CM | POA: Diagnosis not present

## 2016-05-02 ENCOUNTER — Ambulatory Visit
Admission: RE | Admit: 2016-05-02 | Discharge: 2016-05-02 | Disposition: A | Discharge status: Home / Self Care | Payer: Medicare Other | Source: Ambulatory Visit | Attending: Radiation Oncology | Admitting: Radiation Oncology

## 2016-05-02 DIAGNOSIS — Z171 Estrogen receptor negative status [ER-]: Secondary | ICD-10-CM | POA: Diagnosis not present

## 2016-05-02 DIAGNOSIS — C50112 Malignant neoplasm of central portion of left female breast: Secondary | ICD-10-CM | POA: Diagnosis not present

## 2016-05-02 DIAGNOSIS — Z510 Encounter for antineoplastic radiation therapy: Secondary | ICD-10-CM | POA: Diagnosis not present

## 2016-05-03 ENCOUNTER — Ambulatory Visit
Admission: RE | Admit: 2016-05-03 | Discharge: 2016-05-03 | Disposition: A | Discharge status: Home / Self Care | Payer: Medicare Other | Source: Ambulatory Visit | Attending: Radiation Oncology | Admitting: Radiation Oncology

## 2016-05-03 DIAGNOSIS — Z510 Encounter for antineoplastic radiation therapy: Secondary | ICD-10-CM | POA: Diagnosis not present

## 2016-05-03 DIAGNOSIS — Z171 Estrogen receptor negative status [ER-]: Secondary | ICD-10-CM | POA: Diagnosis not present

## 2016-05-03 DIAGNOSIS — C50112 Malignant neoplasm of central portion of left female breast: Secondary | ICD-10-CM | POA: Diagnosis not present

## 2016-05-06 ENCOUNTER — Ambulatory Visit
Admission: RE | Admit: 2016-05-06 | Discharge: 2016-05-06 | Disposition: A | Discharge status: Home / Self Care | Payer: Medicare Other | Source: Ambulatory Visit | Attending: Radiation Oncology | Admitting: Radiation Oncology

## 2016-05-06 ENCOUNTER — Encounter: Payer: Self-pay | Admitting: Radiation Oncology

## 2016-05-06 VITALS — BP 150/72 | HR 88 | Temp 98.2°F | Resp 16 | Ht 61.0 in | Wt 140.2 lb

## 2016-05-06 DIAGNOSIS — Z510 Encounter for antineoplastic radiation therapy: Secondary | ICD-10-CM | POA: Diagnosis not present

## 2016-05-06 DIAGNOSIS — C50112 Malignant neoplasm of central portion of left female breast: Secondary | ICD-10-CM | POA: Diagnosis not present

## 2016-05-06 DIAGNOSIS — Z171 Estrogen receptor negative status [ER-]: Secondary | ICD-10-CM

## 2016-05-06 NOTE — Progress Notes (Addendum)
Ms. Pacetti presents for her 6nd fraction of radiation to her Left Breast. She denies pain. Reports mild fatigue. She reports that she noticed some swelling it has improved to the back part of her breast.  Left breast with no other skin change intact. Using Radiaplex and Alra  as directed.  Appetite is good. Wt Readings from Last 3 Encounters:  05/06/16 140 lb 3.2 oz (63.6 kg)  04/30/16 142 lb 9.6 oz (64.7 kg)  04/17/16 142 lb 3.2 oz (64.5 kg)  BP (!) 150/72   Pulse 88   Temp 98.2 F (36.8 C) (Oral)   Resp 16   Ht 5\' 1"  (1.549 m)   Wt 140 lb 3.2 oz (63.6 kg)   SpO2 100%   BMI 26.49 kg/m

## 2016-05-06 NOTE — Progress Notes (Signed)
   Weekly Management Note:  Outpatient    ICD-9-CM ICD-10-CM   1. Carcinoma of central portion of left breast in female, estrogen receptor negative (HCC) 174.1 C50.112    V86.1 Z17.1     Current Dose:  16.02 Gy  Projected Dose: 50.05 Gy   Narrative:  The patient presents for routine under treatment assessment.  CBCT/MVCT images/Port film x-rays were reviewed.  The chart was checked. Doing well.  Physical Findings:  height is 5\' 1"  (1.549 m) and weight is 140 lb 3.2 oz (63.6 kg). Her oral temperature is 98.2 F (36.8 C). Her blood pressure is 150/72 (abnormal) and her pulse is 88. Her respiration is 16 and oxygen saturation is 100%.   Wt Readings from Last 3 Encounters:  05/06/16 140 lb 3.2 oz (63.6 kg)  04/30/16 142 lb 9.6 oz (64.7 kg)  04/17/16 142 lb 3.2 oz (64.5 kg)   Mild erythema, left breast  Impression:  The patient is tolerating radiotherapy.  Plan:  Continue radiotherapy as planned. Patient instructed to apply Radiaplex to skin in treatment fields.    ________________________________   Eppie Gibson, M.D.

## 2016-05-07 ENCOUNTER — Ambulatory Visit
Admission: RE | Admit: 2016-05-07 | Discharge: 2016-05-07 | Disposition: A | Discharge status: Home / Self Care | Payer: Medicare Other | Source: Ambulatory Visit | Attending: Radiation Oncology | Admitting: Radiation Oncology

## 2016-05-07 DIAGNOSIS — Z510 Encounter for antineoplastic radiation therapy: Secondary | ICD-10-CM | POA: Diagnosis not present

## 2016-05-07 DIAGNOSIS — C50112 Malignant neoplasm of central portion of left female breast: Secondary | ICD-10-CM | POA: Diagnosis not present

## 2016-05-07 DIAGNOSIS — Z171 Estrogen receptor negative status [ER-]: Secondary | ICD-10-CM | POA: Diagnosis not present

## 2016-05-08 ENCOUNTER — Ambulatory Visit
Admission: RE | Admit: 2016-05-08 | Discharge: 2016-05-08 | Disposition: A | Discharge status: Home / Self Care | Payer: Medicare Other | Source: Ambulatory Visit | Attending: Radiation Oncology | Admitting: Radiation Oncology

## 2016-05-08 DIAGNOSIS — C50112 Malignant neoplasm of central portion of left female breast: Secondary | ICD-10-CM | POA: Diagnosis not present

## 2016-05-08 DIAGNOSIS — Z510 Encounter for antineoplastic radiation therapy: Secondary | ICD-10-CM | POA: Diagnosis not present

## 2016-05-08 DIAGNOSIS — Z171 Estrogen receptor negative status [ER-]: Secondary | ICD-10-CM | POA: Diagnosis not present

## 2016-05-09 ENCOUNTER — Ambulatory Visit
Admission: RE | Admit: 2016-05-09 | Discharge: 2016-05-09 | Disposition: A | Discharge status: Home / Self Care | Payer: Medicare Other | Source: Ambulatory Visit | Attending: Radiation Oncology | Admitting: Radiation Oncology

## 2016-05-09 DIAGNOSIS — Z171 Estrogen receptor negative status [ER-]: Secondary | ICD-10-CM | POA: Diagnosis not present

## 2016-05-09 DIAGNOSIS — C50112 Malignant neoplasm of central portion of left female breast: Secondary | ICD-10-CM | POA: Diagnosis not present

## 2016-05-09 DIAGNOSIS — Z510 Encounter for antineoplastic radiation therapy: Secondary | ICD-10-CM | POA: Diagnosis not present

## 2016-05-10 ENCOUNTER — Ambulatory Visit
Admission: RE | Admit: 2016-05-10 | Discharge: 2016-05-10 | Disposition: A | Discharge status: Home / Self Care | Payer: Medicare Other | Source: Ambulatory Visit | Attending: Radiation Oncology | Admitting: Radiation Oncology

## 2016-05-10 DIAGNOSIS — Z171 Estrogen receptor negative status [ER-]: Secondary | ICD-10-CM | POA: Diagnosis not present

## 2016-05-10 DIAGNOSIS — Z510 Encounter for antineoplastic radiation therapy: Secondary | ICD-10-CM | POA: Diagnosis not present

## 2016-05-10 DIAGNOSIS — C50112 Malignant neoplasm of central portion of left female breast: Secondary | ICD-10-CM | POA: Diagnosis not present

## 2016-05-13 ENCOUNTER — Encounter: Payer: Self-pay | Admitting: Radiation Oncology

## 2016-05-13 ENCOUNTER — Ambulatory Visit
Admission: RE | Admit: 2016-05-13 | Discharge: 2016-05-13 | Disposition: A | Discharge status: Home / Self Care | Payer: Medicare Other | Source: Ambulatory Visit | Attending: Radiation Oncology | Admitting: Radiation Oncology

## 2016-05-13 ENCOUNTER — Ambulatory Visit: Payer: Medicare Other

## 2016-05-13 VITALS — BP 144/62 | HR 75 | Temp 97.9°F | Resp 18 | Wt 142.6 lb

## 2016-05-13 DIAGNOSIS — Z510 Encounter for antineoplastic radiation therapy: Secondary | ICD-10-CM | POA: Diagnosis not present

## 2016-05-13 DIAGNOSIS — Z171 Estrogen receptor negative status [ER-]: Secondary | ICD-10-CM | POA: Diagnosis not present

## 2016-05-13 DIAGNOSIS — C50112 Malignant neoplasm of central portion of left female breast: Secondary | ICD-10-CM | POA: Diagnosis not present

## 2016-05-13 NOTE — Progress Notes (Signed)
Photon Boost Complex Emergency planning/management officer Note Outpatient  Diagnosis:   Carcinoma of central portion of left breast in female, estrogen receptor negative ([place]) 174.1 C50.112  V86.1 Z17.1    The patient's CT images from her  simulation were reviewed to plan her boost treatment to her left breast  lumpectomy cavity.  The boost to the lumpectomy cavity will be delivered with 3 photon fields using MLCs for custom blocks again heart and lungs, with 15 and 6 MV photon energy.  This constitutes 3 complex treatment devices. Isodose plan was reviewed and approved. 10 Gy in 5 fractions prescribed.  -----------------------------------  Eppie Gibson, MD

## 2016-05-13 NOTE — Progress Notes (Signed)
   Weekly Management Note:  Outpatient    ICD-9-CM ICD-10-CM   1. Carcinoma of central portion of left breast in female, estrogen receptor negative (HCC) 174.1 C50.112    V86.1 Z17.1     Current Dose:  29.37 Gy  Projected Dose: 50.05 Gy   Narrative:  The patient presents for routine under treatment assessment.  CBCT/MVCT images/Port film x-rays were reviewed.  The chart was checked. Doing well. Transient dizziness yesterday, h/o vertigo.  Physical Findings:  weight is 142 lb 9.6 oz (64.7 kg). Her oral temperature is 97.9 F (36.6 C). Her blood pressure is 144/62 (abnormal) and her pulse is 75. Her respiration is 18 and oxygen saturation is 100%.   Wt Readings from Last 3 Encounters:  05/13/16 142 lb 9.6 oz (64.7 kg)  05/06/16 140 lb 3.2 oz (63.6 kg)  04/30/16 142 lb 9.6 oz (64.7 kg)   Mild erythema, left breast  Impression:  The patient is tolerating radiotherapy.  Plan:  Continue radiotherapy as planned. Patient instructed to apply Radiaplex to skin in treatment fields. Stay hydrated.   ________________________________   Eppie Gibson, M.D.

## 2016-05-13 NOTE — Progress Notes (Addendum)
Jenna Young is here today after her 11th fraction to left breast.  Patient complains of mild fatigue, denies any pain.  Does state that she had some itching to left breast and mild redness present in axillary area.  Patient is using Radiaplex twice a day.  Patient also complains of dizziness that occurred yesterday that passed.  She denies loss of consciousness and states that she has had vertigo in the past.    BP (!) 144/62 (BP Location: Right Arm, Patient Position: Sitting)   Pulse 75   Temp 97.9 F (36.6 C) (Oral)   Resp 18   Wt 142 lb 9.6 oz (64.7 kg)   SpO2 100%   BMI 26.94 kg/m    Wt Readings from Last 3 Encounters:  05/13/16 142 lb 9.6 oz (64.7 kg)  05/06/16 140 lb 3.2 oz (63.6 kg)  04/30/16 142 lb 9.6 oz (64.7 kg)

## 2016-05-14 ENCOUNTER — Ambulatory Visit
Admission: RE | Admit: 2016-05-14 | Discharge: 2016-05-14 | Disposition: A | Discharge status: Home / Self Care | Payer: Medicare Other | Source: Ambulatory Visit | Attending: Radiation Oncology | Admitting: Radiation Oncology

## 2016-05-14 ENCOUNTER — Ambulatory Visit: Payer: Medicare Other

## 2016-05-14 DIAGNOSIS — C50112 Malignant neoplasm of central portion of left female breast: Secondary | ICD-10-CM | POA: Diagnosis not present

## 2016-05-14 DIAGNOSIS — Z510 Encounter for antineoplastic radiation therapy: Secondary | ICD-10-CM | POA: Diagnosis not present

## 2016-05-14 DIAGNOSIS — Z171 Estrogen receptor negative status [ER-]: Secondary | ICD-10-CM | POA: Diagnosis not present

## 2016-05-15 ENCOUNTER — Ambulatory Visit
Admission: RE | Admit: 2016-05-15 | Discharge: 2016-05-15 | Disposition: A | Discharge status: Home / Self Care | Payer: Medicare Other | Source: Ambulatory Visit | Attending: Radiation Oncology | Admitting: Radiation Oncology

## 2016-05-15 ENCOUNTER — Ambulatory Visit: Payer: Medicare Other

## 2016-05-15 DIAGNOSIS — Z510 Encounter for antineoplastic radiation therapy: Secondary | ICD-10-CM | POA: Diagnosis not present

## 2016-05-15 DIAGNOSIS — C50112 Malignant neoplasm of central portion of left female breast: Secondary | ICD-10-CM | POA: Diagnosis not present

## 2016-05-15 DIAGNOSIS — Z171 Estrogen receptor negative status [ER-]: Secondary | ICD-10-CM | POA: Diagnosis not present

## 2016-05-16 ENCOUNTER — Ambulatory Visit
Admission: RE | Admit: 2016-05-16 | Discharge: 2016-05-16 | Disposition: A | Discharge status: Home / Self Care | Payer: Medicare Other | Source: Ambulatory Visit | Attending: Radiation Oncology | Admitting: Radiation Oncology

## 2016-05-16 DIAGNOSIS — C50112 Malignant neoplasm of central portion of left female breast: Secondary | ICD-10-CM | POA: Diagnosis not present

## 2016-05-16 DIAGNOSIS — Z171 Estrogen receptor negative status [ER-]: Secondary | ICD-10-CM | POA: Diagnosis not present

## 2016-05-16 DIAGNOSIS — Z510 Encounter for antineoplastic radiation therapy: Secondary | ICD-10-CM | POA: Diagnosis not present

## 2016-05-17 ENCOUNTER — Ambulatory Visit
Admission: RE | Admit: 2016-05-17 | Discharge: 2016-05-17 | Disposition: A | Discharge status: Home / Self Care | Payer: Medicare Other | Source: Ambulatory Visit | Attending: Radiation Oncology | Admitting: Radiation Oncology

## 2016-05-17 ENCOUNTER — Ambulatory Visit: Payer: Medicare Other

## 2016-05-17 DIAGNOSIS — Z171 Estrogen receptor negative status [ER-]: Secondary | ICD-10-CM | POA: Diagnosis not present

## 2016-05-17 DIAGNOSIS — C50112 Malignant neoplasm of central portion of left female breast: Secondary | ICD-10-CM | POA: Diagnosis not present

## 2016-05-17 DIAGNOSIS — Z510 Encounter for antineoplastic radiation therapy: Secondary | ICD-10-CM | POA: Diagnosis not present

## 2016-05-20 ENCOUNTER — Ambulatory Visit
Admission: RE | Admit: 2016-05-20 | Discharge: 2016-05-20 | Disposition: A | Discharge status: Home / Self Care | Payer: Medicare Other | Source: Ambulatory Visit | Attending: Radiation Oncology | Admitting: Radiation Oncology

## 2016-05-20 VITALS — BP 151/61 | HR 73 | Temp 98.3°F | Resp 16 | Wt 142.4 lb

## 2016-05-20 DIAGNOSIS — Z510 Encounter for antineoplastic radiation therapy: Secondary | ICD-10-CM | POA: Diagnosis not present

## 2016-05-20 DIAGNOSIS — Z171 Estrogen receptor negative status [ER-]: Secondary | ICD-10-CM

## 2016-05-20 DIAGNOSIS — C50112 Malignant neoplasm of central portion of left female breast: Secondary | ICD-10-CM | POA: Diagnosis not present

## 2016-05-20 NOTE — Progress Notes (Signed)
Jenna Young completed 1st fraction of boost treatment today to left breast.  Patient reports having irritation to left breast but more itching.  Skin to left breast is red in appearance.  Patient reports using radiaplex gel to area twice daily.  Patient reports feeling fatigued.   Denies any decrease in appetite.  Vitals:   05/20/16 1029  BP: (!) 151/61  Pulse: 73  Resp: 16  Temp: 98.3 F (36.8 C)  TempSrc: Oral  SpO2: 99%  Weight: 142 lb 6.4 oz (64.6 kg)    Wt Readings from Last 3 Encounters:  05/20/16 142 lb 6.4 oz (64.6 kg)  05/13/16 142 lb 9.6 oz (64.7 kg)  05/06/16 140 lb 3.2 oz (63.6 kg)

## 2016-05-20 NOTE — Progress Notes (Signed)
   Weekly Management Note:  Outpatient    ICD-9-CM ICD-10-CM   1. Carcinoma of central portion of left breast in female, estrogen receptor negative (HCC) 174.1 C50.112    V86.1 Z17.1     Current Dose:  42.05 Gy  Projected Dose: 50.05 Gy   Narrative:  The patient presents for routine under treatment assessment.  CBCT/MVCT images/Port film x-rays were reviewed.  The chart was checked. Doing well.  Itching in UOQ of left breast  Physical Findings:  weight is 142 lb 6.4 oz (64.6 kg). Her oral temperature is 98.3 F (36.8 C). Her blood pressure is 151/61 (abnormal) and her pulse is 73. Her respiration is 16 and oxygen saturation is 99%.   Wt Readings from Last 3 Encounters:  05/20/16 142 lb 6.4 oz (64.6 kg)  05/13/16 142 lb 9.6 oz (64.7 kg)  05/06/16 140 lb 3.2 oz (63.6 kg)   Moderate erythema, left breast, esp in UOQ Skin intact   Impression:  The patient is tolerating radiotherapy.  Plan:  Continue radiotherapy as planned. Patient instructed to apply Radiaplex to skin in treatment fields. Apply hydrocortisone 1% cream in itchy areas as well. F/u in 42mo.   ________________________________   Eppie Gibson, M.D.

## 2016-05-21 ENCOUNTER — Ambulatory Visit
Admission: RE | Admit: 2016-05-21 | Discharge: 2016-05-21 | Disposition: A | Discharge status: Home / Self Care | Payer: Medicare Other | Source: Ambulatory Visit | Attending: Radiation Oncology | Admitting: Radiation Oncology

## 2016-05-21 DIAGNOSIS — C50112 Malignant neoplasm of central portion of left female breast: Secondary | ICD-10-CM | POA: Diagnosis not present

## 2016-05-21 DIAGNOSIS — Z171 Estrogen receptor negative status [ER-]: Secondary | ICD-10-CM | POA: Diagnosis not present

## 2016-05-21 DIAGNOSIS — Z510 Encounter for antineoplastic radiation therapy: Secondary | ICD-10-CM | POA: Diagnosis not present

## 2016-05-22 ENCOUNTER — Ambulatory Visit
Admission: RE | Admit: 2016-05-22 | Discharge: 2016-05-22 | Disposition: A | Discharge status: Home / Self Care | Payer: Medicare Other | Source: Ambulatory Visit | Attending: Radiation Oncology | Admitting: Radiation Oncology

## 2016-05-22 DIAGNOSIS — C50112 Malignant neoplasm of central portion of left female breast: Secondary | ICD-10-CM | POA: Diagnosis not present

## 2016-05-22 DIAGNOSIS — Z171 Estrogen receptor negative status [ER-]: Secondary | ICD-10-CM | POA: Diagnosis not present

## 2016-05-22 DIAGNOSIS — Z510 Encounter for antineoplastic radiation therapy: Secondary | ICD-10-CM | POA: Diagnosis not present

## 2016-05-23 ENCOUNTER — Ambulatory Visit
Admission: RE | Admit: 2016-05-23 | Discharge: 2016-05-23 | Disposition: A | Discharge status: Home / Self Care | Payer: Medicare Other | Source: Ambulatory Visit | Attending: Radiation Oncology | Admitting: Radiation Oncology

## 2016-05-23 DIAGNOSIS — Z510 Encounter for antineoplastic radiation therapy: Secondary | ICD-10-CM | POA: Diagnosis not present

## 2016-05-23 DIAGNOSIS — Z171 Estrogen receptor negative status [ER-]: Secondary | ICD-10-CM | POA: Diagnosis not present

## 2016-05-23 DIAGNOSIS — C50112 Malignant neoplasm of central portion of left female breast: Secondary | ICD-10-CM | POA: Diagnosis not present

## 2016-05-24 ENCOUNTER — Ambulatory Visit
Admission: RE | Admit: 2016-05-24 | Discharge: 2016-05-24 | Disposition: A | Discharge status: Home / Self Care | Payer: Medicare Other | Source: Ambulatory Visit | Attending: Radiation Oncology | Admitting: Radiation Oncology

## 2016-05-24 ENCOUNTER — Telehealth: Payer: Self-pay | Admitting: *Deleted

## 2016-05-24 ENCOUNTER — Encounter: Payer: Self-pay | Admitting: Radiation Oncology

## 2016-05-24 DIAGNOSIS — Z171 Estrogen receptor negative status [ER-]: Secondary | ICD-10-CM | POA: Diagnosis not present

## 2016-05-24 DIAGNOSIS — Z510 Encounter for antineoplastic radiation therapy: Secondary | ICD-10-CM | POA: Diagnosis not present

## 2016-05-24 DIAGNOSIS — C50112 Malignant neoplasm of central portion of left female breast: Secondary | ICD-10-CM | POA: Diagnosis not present

## 2016-05-24 NOTE — Telephone Encounter (Signed)
Called pt to congratulate on completion of xrt. Relate doing well and without complaints. Discussed next step is survivorship program. Denies questions or needs at this time. Encourage pt to call with concerns. Received verbal understanding.

## 2016-05-28 NOTE — Progress Notes (Signed)
  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  Radiation treatment dates:   04/29/16 - 05/24/16  Site/dose:    1) Left breast: 40.05 Gy in 15 fractions. 2) Left breast boost: 10 Gy in 5 fractions.  Beams/energy:    1) 3D // 6X Photon 2) Isodose Plan // 15X, 6X Photon  Narrative: The patient tolerated radiation treatment relatively well. The patient complained of itching in the UOQ of the left breast. There was moderate erythema of the left breast, more so in the UOQ. The skin was intact. The patient was to apply hydrocortisone 1% cream to the areas of pruritus. Continue to apply Radiaplex to the skin in the treatment fields.  Plan: The patient has completed radiation treatment. The patient will return to radiation oncology clinic for routine followup in one month. I advised them to call or return sooner if they have any questions or concerns related to their recovery or treatment.  -----------------------------------  Eppie Gibson, MD  This document serves as a record of services personally performed by Eppie Gibson, MD. It was created on her behalf by Darcus Austin, a trained medical scribe. The creation of this record is based on the scribe's personal observations and the provider's statements to them. This document has been checked and approved by the attending provider.

## 2016-06-12 ENCOUNTER — Other Ambulatory Visit: Payer: Self-pay | Admitting: Family Medicine

## 2016-06-19 ENCOUNTER — Ambulatory Visit (INDEPENDENT_AMBULATORY_CARE_PROVIDER_SITE_OTHER): Payer: Medicare Other | Admitting: *Deleted

## 2016-06-19 DIAGNOSIS — E538 Deficiency of other specified B group vitamins: Secondary | ICD-10-CM | POA: Diagnosis not present

## 2016-06-19 MED ORDER — CYANOCOBALAMIN 1000 MCG/ML IJ SOLN
1000.0000 ug | Freq: Once | INTRAMUSCULAR | Status: AC
  Administered 2016-06-19: 1000 ug via INTRAMUSCULAR

## 2016-06-19 NOTE — Progress Notes (Signed)
Pt tolerated inj well

## 2016-06-26 ENCOUNTER — Encounter: Payer: Self-pay | Admitting: Radiation Oncology

## 2016-06-28 ENCOUNTER — Ambulatory Visit
Admission: RE | Admit: 2016-06-28 | Discharge: 2016-06-28 | Disposition: A | Discharge status: Home / Self Care | Payer: Medicare Other | Source: Ambulatory Visit | Attending: Radiation Oncology | Admitting: Radiation Oncology

## 2016-06-28 ENCOUNTER — Encounter: Payer: Self-pay | Admitting: Radiation Oncology

## 2016-06-28 VITALS — BP 142/61 | HR 79 | Temp 98.1°F | Ht 61.0 in | Wt 142.4 lb

## 2016-06-28 DIAGNOSIS — Z885 Allergy status to narcotic agent status: Secondary | ICD-10-CM | POA: Insufficient documentation

## 2016-06-28 DIAGNOSIS — Z171 Estrogen receptor negative status [ER-]: Secondary | ICD-10-CM | POA: Diagnosis not present

## 2016-06-28 DIAGNOSIS — Z882 Allergy status to sulfonamides status: Secondary | ICD-10-CM | POA: Diagnosis not present

## 2016-06-28 DIAGNOSIS — Z888 Allergy status to other drugs, medicaments and biological substances status: Secondary | ICD-10-CM | POA: Insufficient documentation

## 2016-06-28 DIAGNOSIS — Z880 Allergy status to penicillin: Secondary | ICD-10-CM | POA: Diagnosis not present

## 2016-06-28 DIAGNOSIS — Z7982 Long term (current) use of aspirin: Secondary | ICD-10-CM | POA: Diagnosis not present

## 2016-06-28 DIAGNOSIS — C50112 Malignant neoplasm of central portion of left female breast: Secondary | ICD-10-CM

## 2016-06-28 DIAGNOSIS — G8929 Other chronic pain: Secondary | ICD-10-CM | POA: Diagnosis not present

## 2016-06-28 DIAGNOSIS — M545 Low back pain: Secondary | ICD-10-CM | POA: Diagnosis not present

## 2016-06-28 HISTORY — DX: Personal history of irradiation: Z92.3

## 2016-06-28 NOTE — Progress Notes (Signed)
Ms. Stuckert

## 2016-06-28 NOTE — Progress Notes (Signed)
Radiation Oncology         [phone] ________________________________  Name: Jenna Young MRN: [id number]  Date: 06/28/2016  DOB: Jun 21, 1931  Follow-Up Visit Note  Outpatient  CC: Loura Pardon, MD  Jovita Kussmaul, MD  Diagnosis and Prior Radiotherapy:    ICD-9-CM ICD-10-CM   1. Carcinoma of central portion of left breast in female, estrogen receptor negative (HCC) 174.1 C50.112    V86.1 Z17.1     Stage 0 (pTisN0M0) LeftBreast Central Ductal Carcinoma In Situ, ER Negative/ PR Negative HighGrade with close margins   Radiation treatment dates:   04/29/16 - 05/24/16  Site/dose:    1) Left breast: 40.05 Gy in 15 fractions. 2) Left breast boost: 10 Gy in 5 fractions.  CHIEF COMPLAINT: Here for follow-up and surveillance of left cancer  Narrative:  The patient returns today for routine follow-up. She presents today with her daughter. She denies pain to her left breast, but does reports lower back pain that is chronic in nature. Her energy has decreased since before starting radiation. She continues to use Radiaplex occasionally.  ALLERGIES:  is allergic to buspirone hcl; codeine; hydrochlorothiazide; lansoprazole; minocycline hcl; penicillins; ramipril; and sulfa antibiotics.  Meds: Current Outpatient Prescriptions  Medication Sig Dispense Refill  . aspirin (ASPIRIN EC) 81 MG EC tablet Take 81 mg by mouth daily. Swallow whole.    . calcium-vitamin D (OSCAL WITH D) 500-200 MG-UNIT per tablet Take 2 tablets by mouth daily.      . Cholecalciferol (VITAMIN D) 2000 UNITS CAPS Take 1 capsule by mouth daily.      . Cyanocobalamin (B-12 COMPLIANCE INJECTION IJ) Inject as directed.    . hyaluronate sodium (RADIAPLEXRX) GEL Apply 1 application topically once.    . isosorbide mononitrate (IMDUR) 30 MG 24 hr tablet Take 30 mg by mouth daily.    Marland Kitchen losartan (COZAAR) 100 MG tablet TAKE 1 TABLET BY MOUTH EVERY DAY 30 tablet 5  . omeprazole (PRILOSEC) 20 MG capsule TAKE 1 CAPSULE BY MOUTH  EVERY DAY 30 capsule 5  . amLODipine (NORVASC) 5 MG tablet Take 1 tablet (5 mg total) by mouth daily as needed (only for high bnlood pressure). 90 tablet 3  . traMADol (ULTRAM) 50 MG tablet Take 1-2 tablets (50-100 mg total) by mouth every 6 (six) hours as needed. (Patient not taking: Reported on 05/06/2016) 30 tablet 1   No current facility-administered medications for this encounter.     Physical Findings: The patient is in no acute distress. Patient is alert and oriented.  height is 5\' 1"  (1.549 m) and weight is 142 lb 6.4 oz (64.6 kg). Her temperature is 98.1 F (36.7 C). Her blood pressure is 142/61 (abnormal) and her pulse is 79. Her oxygen saturation is 99%. .    General: Alert and oriented, in no acute distress Heart: Regular in rate and rhythm with no murmurs, rubs, or gallops. Chest: Clear to auscultation bilaterally, with no rhonchi, wheezes, or rales. Psychiatric: Judgment and insight are intact. Affect is appropriate. BREAST: Subtle residual hyperpigmentation over her left breast. Skin has healed well.  Lab Findings: Lab Results  Component Value Date   WBC 6.4 03/21/2016   HGB 11.0 (L) 03/21/2016   HCT 32.7 (L) 03/21/2016   MCV 78.9 03/21/2016   PLT 312.0 03/21/2016    Radiographic Findings: No results found.  Impression/Plan: healed well from treatment per clinical exam.  I encouraged her to continue with yearly mammography and followup with medical oncology. I will see her  back on an as-needed basis. I have encouraged her to call if she has any issues or concerns in the future. I wished her the very best. I encouraged her to use vitamin E lotion over her left breast and to start easing back into her normal exercise routine. She is scheduled to attend Survivorship on 08/02/16.    _____________________________________   Eppie Gibson, MD  This document serves as a record of services personally performed by Eppie Gibson, MD. It was created on her behalf by Darcus Austin, a trained medical scribe. The creation of this record is based on the scribe's personal observations and the provider's statements to them. This document has been checked and approved by the attending provider.

## 2016-06-28 NOTE — Addendum Note (Signed)
Encounter addended by: Ernst Spell, RN on: 06/28/2016  1:40 PM<BR>    Actions taken: Charge Capture section accepted

## 2016-06-28 NOTE — Progress Notes (Signed)
Jenna Young presents for follow up of radiation completed 05/24/16 to her Left Breast. She denies pain to her Left Breast, but does report chronic lower back pain. She reports decreased energy since before starting radiation. Her Left Breast has healed and she continues to use Radiaplex occasionally. She has an appointment with survivorship on 08/02/16.   BP (!) 142/61   Pulse 79   Temp 98.1 F (36.7 C)   Ht 5\' 1"  (1.549 m)   Wt 142 lb 6.4 oz (64.6 kg)   SpO2 99% Comment: room air  BMI 26.91 kg/m    Wt Readings from Last 3 Encounters:  06/28/16 142 lb 6.4 oz (64.6 kg)  05/20/16 142 lb 6.4 oz (64.6 kg)  05/13/16 142 lb 9.6 oz (64.7 kg)

## 2016-07-24 DIAGNOSIS — R55 Syncope and collapse: Secondary | ICD-10-CM | POA: Diagnosis not present

## 2016-08-01 ENCOUNTER — Telehealth: Payer: Self-pay | Admitting: Family Medicine

## 2016-08-01 NOTE — Telephone Encounter (Signed)
I spoke with pt and she is feeling better and pt cannot drive to [place] to be seen at another [facility] site; pt said if she starts feeling worse she will go to [facility]. Pt will cb to ortho office to see if any cancellations so pt could be seen sooner than end of next week. FYXI to Dr Glori Bickers.

## 2016-08-01 NOTE — Telephone Encounter (Signed)
Thanks- schedule f/u with me when able after she sees ortho if needed

## 2016-08-01 NOTE — Telephone Encounter (Signed)
Patient Name: HANNAHMARIE ASBERRY DOB: 1931-05-05 Initial Comment caller states she is jittery, nervous, has alot of fatigue an does not feel good . she is having back pain from fractured verterbras ( sees orthopedic next week ) . states she had radiation in march for her breast ( lumpectomy ) Nurse Assessment Guidelines Guideline Title Affirmed Question Affirmed Notes Final Disposition User FINAL ATTEMPT MADE - message left [place], RN, [employer]

## 2016-08-02 ENCOUNTER — Encounter: Payer: Medicare Other | Admitting: Adult Health

## 2016-08-06 ENCOUNTER — Other Ambulatory Visit: Payer: Self-pay | Admitting: Cardiovascular Disease

## 2016-08-09 DIAGNOSIS — M4696 Unspecified inflammatory spondylopathy, lumbar region: Secondary | ICD-10-CM | POA: Diagnosis not present

## 2016-08-09 DIAGNOSIS — S32010A Wedge compression fracture of first lumbar vertebra, initial encounter for closed fracture: Secondary | ICD-10-CM | POA: Diagnosis not present

## 2016-08-09 DIAGNOSIS — M545 Low back pain: Secondary | ICD-10-CM | POA: Diagnosis not present

## 2016-08-15 ENCOUNTER — Other Ambulatory Visit: Payer: Self-pay | Admitting: Orthopedic Surgery

## 2016-08-15 DIAGNOSIS — H00021 Hordeolum internum right upper eyelid: Secondary | ICD-10-CM | POA: Diagnosis not present

## 2016-08-15 DIAGNOSIS — I10 Essential (primary) hypertension: Secondary | ICD-10-CM | POA: Diagnosis not present

## 2016-08-15 DIAGNOSIS — S32010A Wedge compression fracture of first lumbar vertebra, initial encounter for closed fracture: Secondary | ICD-10-CM

## 2016-08-15 DIAGNOSIS — H2513 Age-related nuclear cataract, bilateral: Secondary | ICD-10-CM | POA: Diagnosis not present

## 2016-08-17 NOTE — Progress Notes (Signed)
Cardiology Office Note  Date:  08/19/2016   ID:  Jenna Young, DOB 02-28-1932, MRN [id number]  PCP:  Abner Greenspan, MD   Chief Complaint  Patient presents with  . other    6 month follow up. Patient states at times she feels her heart racing. Meds reviewed verbally with patient.     HPI:  Jenna Young is a pleasant 83 year old woman who initially presented with palpitations, murmur, leg edema,  Prior history of vertigo DCIS who follows up today for her hypertension, near syncope/syncope   Rare episodes of palpitations, Event monitor with 3 beat runs of NSVT No other significant arrhythmia  diagnosis of DCIS, had a biopsy bx, needs lumpectomy left side Procedure scheduled to be performed by Dr.Paul  Marlou Starks, [place] surgery Completed XRT  Having back problem Drove to [employer] was out on the [place], 8 hour trip Developed Severe back pain on the trip Had syncope from a standing position in the setting of severe back pain, family caught her Quick recovery, sat in a wheelchair Was told that she was  dehydrated, 87 systolic She recovered well with fluids, no further problems  EKG on 01/14/2016 shows normal sinus rhythm rate 76 bpm, LVH EKG on today's visit shows normal sinus rhythm rate 85 bpm, no significant ST or T-wave changes  Other past medical history reviewed  previously on amlodipine but wanted to change secondary to leg edema Started on bystolic Blood pressure continue to run high Started on HCTZ every other day  could not afford Bystolic long term   was changed to clonidine.  Presented to the emergency room December 31 2015 with near syncope , dehydration  "Fell asleep in the sun " while she was on a swing set, was difficult to arouse, was taken inside by family, had large bowel movement   in the emergency room heart rate 62 bpm , sodium down to 129, creatinine elevated Notes indicate she was only taking clonidine once a day   Seen by our office on  01/03/2016  Blood pressure was 196/80 in the office, she was not taking any of her medications She declined a 30 day monitor, declined ischemic evaluation Restarted on losartan 100 mg daily, HCTZ every other day, Imdur 30 mg daily In follow-up blood pressures 140s up to 150 Recommended to increase Imdur up to 60 mg daily on January 08 2016 On November 5, presented to the emergency room for near-syncope EMS noted systolic pressure 92. Emergency room low pressure 130/62 Orthostatics negative in the emergency room UTI was treated with keflex, Imdur decreased back to 30 mg daily On lab work she did not appear dehydrated, hematocrit 31.7 Subsequent lab work sodium was 126 On her last clinic visit HCTZ held, amlodipine held, continued on losartan 100 daily and Imdur 30 daily  Previous lab work reviewed total cholesterol 200, LDL 115 Hematocrit 32 Previous abdominal symptoms with iron pills, dizziness, possible vasovagal symptoms   PMH:   has a past medical history of Allergic rhinitis; Anemia; Anxiety; GERD (gastroesophageal reflux disease); History of radiation therapy (04/29/16- 05/24/16); Hyperlipidemia; Hypertension; Osteopenia; and Osteoporosis.  PSH:    Past Surgical History:  Procedure Laterality Date  . APPENDECTOMY    . BREAST LUMPECTOMY WITH RADIOACTIVE SEED LOCALIZATION Left 02/27/2016   Procedure: LEFT BREAST LUMPECTOMY WITH RADIOACTIVE SEED LOCALIZATION;  Surgeon: Autumn Messing III, MD;  Location: [facility];  Service: General;  Laterality: Left;  . BREAST SURGERY  10/10   benign biopsy  negative  . CESAREAN SECTION    . CHOLECYSTECTOMY    . FEMUR FRACTURE SURGERY Right 2006  . OVARIAN CYST SURGERY    . RE-EXCISION OF BREAST LUMPECTOMY Left 04/01/2016   Procedure: RE-EXCISION OF LEFT BREAST MEDIAL MARGIN;  Surgeon: Autumn Messing III, MD;  Location: [facility];  Service: General;  Laterality: Left;    Current Outpatient Prescriptions  Medication Sig  Dispense Refill  . amLODipine (NORVASC) 5 MG tablet Take 1 tablet (5 mg total) by mouth daily as needed (only for high bnlood pressure). 90 tablet 3  . aspirin (ASPIRIN EC) 81 MG EC tablet Take 81 mg by mouth daily. Swallow whole.    . calcium-vitamin D (OSCAL WITH D) 500-200 MG-UNIT per tablet Take 2 tablets by mouth daily.      . Cholecalciferol (VITAMIN D) 2000 UNITS CAPS Take 1 capsule by mouth daily.      . Cyanocobalamin (B-12 COMPLIANCE INJECTION IJ) Inject as directed.    . hyaluronate sodium (RADIAPLEXRX) GEL Apply 1 application topically once.    . isosorbide mononitrate (IMDUR) 30 MG 24 hr tablet Take 30 mg by mouth daily.    Marland Kitchen losartan (COZAAR) 100 MG tablet TAKE 1 TABLET BY MOUTH EVERY DAY 30 tablet 5  . omeprazole (PRILOSEC) 20 MG capsule TAKE 1 CAPSULE BY MOUTH EVERY DAY 30 capsule 5  . traMADol (ULTRAM) 50 MG tablet Take 1-2 tablets (50-100 mg total) by mouth every 6 (six) hours as needed. 30 tablet 1   No current facility-administered medications for this visit.      Allergies:   Buspirone hcl; Codeine; Hydrochlorothiazide; Lansoprazole; Minocycline hcl; Penicillins; Ramipril; and Sulfa antibiotics   Social History:  The patient  reports that she quit smoking about 48 years ago. She has never used smokeless tobacco. She reports that she does not drink alcohol or use drugs.   Family History:   family history includes Cancer in her maternal aunt; Diabetes in her maternal aunt; Heart disease in her father; Hypertension in her mother; Macular degeneration in her sister.    Review of Systems: Review of Systems  Constitutional: Negative.   Respiratory: Negative.   Cardiovascular: Negative.   Gastrointestinal: Negative.   Musculoskeletal: Positive for back pain.  Neurological: Positive for loss of consciousness.  Psychiatric/Behavioral: Negative.   All other systems reviewed and are negative.    PHYSICAL EXAM: VS:  BP 140/62 (BP Location: Left Arm, Patient Position:  Sitting, Cuff Size: Normal)   Pulse 85   Ht 5\' 1"  (1.549 m)   Wt 142 lb 8 oz (64.6 kg)   BMI 26.93 kg/m  , BMI Body mass index is 26.93 kg/m.  GEN: Well nourished, well developed, in no acute distress  HEENT: normal  Neck: no JVD, carotid bruits, or masses Cardiac: RRR; 1+ SEM RSB,  no rubs, or gallops,no edema  Respiratory:  clear to auscultation bilaterally, normal work of breathing GI: soft, nontender, nondistended, + BS MS: no deformity or atrophy  Skin: warm and dry, no rash Neuro:  Strength and sensation are intact Psych: euthymic mood, full affect    Recent Labs: 03/21/2016: ALT 10; BUN 12; Creatinine, Ser 0.94; Hemoglobin 11.0; Platelets 312.0; Potassium 4.4; Sodium 139; TSH 2.07    Lipid Panel Lab Results  Component Value Date   CHOL 205 (H) 03/21/2016   HDL 56.50 03/21/2016   LDLCALC 125 (H) 03/21/2016   TRIG 117.0 03/21/2016      Wt Readings from Last 3 Encounters:  08/19/16  142 lb 8 oz (64.6 kg)  06/28/16 142 lb 6.4 oz (64.6 kg)  05/20/16 142 lb 6.4 oz (64.6 kg)       ASSESSMENT AND PLAN:  Essential hypertension, benign - Plan: EKG 12-Lead Currently not taking amlodipine Only on losartan, low-dose Imdur Recommended she stay hydrated  Palpitations - Plan: EKG 12-Lead Monitor reviewed with her again on today's visit Short run nonsustained VT 4 beats  Happening rarely, she does not want medications at this time  Pre-operative cardiovascular examination - Plan: EKG 12-Lead Acceptable risk for upcoming lumpectomy on left No further testing needed  Pure hypercholesterolemia Currently not on a cholesterol medication Ductal carcinoma in situ (DCIS) of left breast Treatment is complete, radiation to the left  Syncope Jenna Young was not air-conditioned driving to [place] Hypotension while standing in the setting of severe back pain Unable to exclude orthostasis from dehydration versus vasovagal syncope If she has any recurrent symptoms recommended she  call our office   Total encounter time more than 25 minutes  Greater than 50% was spent in counseling and coordination of care with the patient   Disposition:   F/U  6 months   Orders Placed This Encounter  Procedures  . EKG 12-Lead     Signed, Esmond Plants, M.D., Ph.D. 08/19/2016  [place], [place]

## 2016-08-19 ENCOUNTER — Encounter: Payer: Self-pay | Admitting: Cardiovascular Disease

## 2016-08-19 ENCOUNTER — Ambulatory Visit (INDEPENDENT_AMBULATORY_CARE_PROVIDER_SITE_OTHER): Payer: Medicare Other | Admitting: Cardiovascular Disease

## 2016-08-19 VITALS — BP 140/62 | HR 85 | Ht 61.0 in | Wt 142.5 lb

## 2016-08-19 DIAGNOSIS — R600 Localized edema: Secondary | ICD-10-CM

## 2016-08-19 DIAGNOSIS — Z171 Estrogen receptor negative status [ER-]: Secondary | ICD-10-CM

## 2016-08-19 DIAGNOSIS — C50112 Malignant neoplasm of central portion of left female breast: Secondary | ICD-10-CM | POA: Diagnosis not present

## 2016-08-19 DIAGNOSIS — E7800 Pure hypercholesterolemia, unspecified: Secondary | ICD-10-CM

## 2016-08-19 DIAGNOSIS — I10 Essential (primary) hypertension: Secondary | ICD-10-CM

## 2016-08-19 NOTE — Patient Instructions (Signed)
Please call the office for any dizzy or pass out spells   Medication Instructions:   No medication changes made  Labwork:  No new labs needed  Testing/Procedures:  No further testing at this time   I recommend watching educational videos on topics of interest to you at:       [email or web address]  Enter code: HEARTCARE    Follow-Up: It was a pleasure seeing you in the office today. Please call us if you have new issues that need to be addressed before your next appt.  [phone]  Your physician wants you to follow-up in: 6 months.  You will receive a reminder letter in the mail two months in advance. If you don't receive a letter, please call our office to schedule the follow-up appointment.  If you need a refill on your cardiac medications before your next appointment, please call your pharmacy.

## 2016-08-25 ENCOUNTER — Ambulatory Visit
Admission: RE | Admit: 2016-08-25 | Discharge: 2016-08-25 | Disposition: A | Discharge status: Home / Self Care | Payer: Medicare Other | Source: Ambulatory Visit | Attending: Orthopedic Surgery | Admitting: Orthopedic Surgery

## 2016-08-25 DIAGNOSIS — S32000A Wedge compression fracture of unspecified lumbar vertebra, initial encounter for closed fracture: Secondary | ICD-10-CM | POA: Diagnosis not present

## 2016-08-25 DIAGNOSIS — S32010A Wedge compression fracture of first lumbar vertebra, initial encounter for closed fracture: Secondary | ICD-10-CM

## 2016-09-17 DIAGNOSIS — H00024 Hordeolum internum left upper eyelid: Secondary | ICD-10-CM | POA: Diagnosis not present

## 2016-09-17 DIAGNOSIS — H2513 Age-related nuclear cataract, bilateral: Secondary | ICD-10-CM | POA: Diagnosis not present

## 2016-09-17 DIAGNOSIS — I10 Essential (primary) hypertension: Secondary | ICD-10-CM | POA: Diagnosis not present

## 2016-09-19 ENCOUNTER — Ambulatory Visit: Payer: Medicare Other

## 2016-09-19 ENCOUNTER — Ambulatory Visit (INDEPENDENT_AMBULATORY_CARE_PROVIDER_SITE_OTHER): Payer: Medicare Other | Admitting: *Deleted

## 2016-09-19 DIAGNOSIS — E538 Deficiency of other specified B group vitamins: Secondary | ICD-10-CM

## 2016-09-19 MED ORDER — CYANOCOBALAMIN 1000 MCG/ML IJ SOLN
1000.0000 ug | Freq: Once | INTRAMUSCULAR | Status: AC
  Administered 2016-09-19: 1000 ug via INTRAMUSCULAR

## 2016-09-27 ENCOUNTER — Other Ambulatory Visit: Payer: Self-pay | Admitting: Family Medicine

## 2016-10-12 ENCOUNTER — Other Ambulatory Visit: Payer: Self-pay | Admitting: Cardiovascular Disease

## 2016-12-10 ENCOUNTER — Other Ambulatory Visit: Payer: Self-pay | Admitting: Family Medicine

## 2016-12-10 DIAGNOSIS — H18413 Arcus senilis, bilateral: Secondary | ICD-10-CM | POA: Diagnosis not present

## 2016-12-10 DIAGNOSIS — H2511 Age-related nuclear cataract, right eye: Secondary | ICD-10-CM | POA: Diagnosis not present

## 2016-12-10 DIAGNOSIS — H2513 Age-related nuclear cataract, bilateral: Secondary | ICD-10-CM | POA: Diagnosis not present

## 2016-12-10 DIAGNOSIS — H25013 Cortical age-related cataract, bilateral: Secondary | ICD-10-CM | POA: Diagnosis not present

## 2016-12-10 DIAGNOSIS — H25011 Cortical age-related cataract, right eye: Secondary | ICD-10-CM | POA: Diagnosis not present

## 2016-12-10 DIAGNOSIS — H25043 Posterior subcapsular polar age-related cataract, bilateral: Secondary | ICD-10-CM | POA: Diagnosis not present

## 2016-12-10 DIAGNOSIS — H40003 Preglaucoma, unspecified, bilateral: Secondary | ICD-10-CM | POA: Diagnosis not present

## 2016-12-24 ENCOUNTER — Ambulatory Visit (INDEPENDENT_AMBULATORY_CARE_PROVIDER_SITE_OTHER): Payer: Medicare Other

## 2016-12-24 DIAGNOSIS — Z23 Encounter for immunization: Secondary | ICD-10-CM

## 2016-12-24 DIAGNOSIS — E538 Deficiency of other specified B group vitamins: Secondary | ICD-10-CM

## 2016-12-24 MED ORDER — CYANOCOBALAMIN 1000 MCG/ML IJ SOLN
1000.0000 ug | Freq: Once | INTRAMUSCULAR | Status: AC
  Administered 2016-12-24: 1000 ug via INTRAMUSCULAR

## 2017-01-07 ENCOUNTER — Other Ambulatory Visit: Payer: Self-pay | Admitting: Family Medicine

## 2017-02-03 DIAGNOSIS — H2511 Age-related nuclear cataract, right eye: Secondary | ICD-10-CM | POA: Diagnosis not present

## 2017-02-04 DIAGNOSIS — I10 Essential (primary) hypertension: Secondary | ICD-10-CM | POA: Diagnosis not present

## 2017-02-04 DIAGNOSIS — H2511 Age-related nuclear cataract, right eye: Secondary | ICD-10-CM | POA: Diagnosis not present

## 2017-02-04 DIAGNOSIS — Z961 Presence of intraocular lens: Secondary | ICD-10-CM | POA: Diagnosis not present

## 2017-02-04 DIAGNOSIS — H2512 Age-related nuclear cataract, left eye: Secondary | ICD-10-CM | POA: Diagnosis not present

## 2017-02-04 DIAGNOSIS — H52221 Regular astigmatism, right eye: Secondary | ICD-10-CM | POA: Diagnosis not present

## 2017-02-10 ENCOUNTER — Encounter: Payer: Self-pay | Admitting: Emergency Medicine

## 2017-02-10 ENCOUNTER — Other Ambulatory Visit: Payer: Self-pay

## 2017-02-10 ENCOUNTER — Emergency Department: Payer: Medicare Other

## 2017-02-10 ENCOUNTER — Ambulatory Visit: Payer: Self-pay

## 2017-02-10 ENCOUNTER — Inpatient Hospital Stay
Admission: EM | Admit: 2017-02-10 | Discharge: 2017-02-13 | DRG: 251 | Disposition: A | Discharge status: Home / Self Care | Payer: Medicare Other | Attending: Internal Medicine | Admitting: Internal Medicine

## 2017-02-10 DIAGNOSIS — I252 Old myocardial infarction: Secondary | ICD-10-CM | POA: Diagnosis present

## 2017-02-10 DIAGNOSIS — E785 Hyperlipidemia, unspecified: Secondary | ICD-10-CM | POA: Diagnosis present

## 2017-02-10 DIAGNOSIS — Z833 Family history of diabetes mellitus: Secondary | ICD-10-CM | POA: Diagnosis not present

## 2017-02-10 DIAGNOSIS — Z923 Personal history of irradiation: Secondary | ICD-10-CM

## 2017-02-10 DIAGNOSIS — E782 Mixed hyperlipidemia: Secondary | ICD-10-CM | POA: Diagnosis not present

## 2017-02-10 DIAGNOSIS — K219 Gastro-esophageal reflux disease without esophagitis: Secondary | ICD-10-CM | POA: Diagnosis present

## 2017-02-10 DIAGNOSIS — I10 Essential (primary) hypertension: Secondary | ICD-10-CM | POA: Diagnosis present

## 2017-02-10 DIAGNOSIS — M810 Age-related osteoporosis without current pathological fracture: Secondary | ICD-10-CM | POA: Diagnosis present

## 2017-02-10 DIAGNOSIS — Z79899 Other long term (current) drug therapy: Secondary | ICD-10-CM

## 2017-02-10 DIAGNOSIS — I2510 Atherosclerotic heart disease of native coronary artery without angina pectoris: Secondary | ICD-10-CM | POA: Diagnosis present

## 2017-02-10 DIAGNOSIS — Z8249 Family history of ischemic heart disease and other diseases of the circulatory system: Secondary | ICD-10-CM | POA: Diagnosis not present

## 2017-02-10 DIAGNOSIS — I214 Non-ST elevation (NSTEMI) myocardial infarction: Principal | ICD-10-CM | POA: Diagnosis present

## 2017-02-10 DIAGNOSIS — I959 Hypotension, unspecified: Secondary | ICD-10-CM | POA: Diagnosis present

## 2017-02-10 DIAGNOSIS — Z87891 Personal history of nicotine dependence: Secondary | ICD-10-CM | POA: Diagnosis not present

## 2017-02-10 DIAGNOSIS — R600 Localized edema: Secondary | ICD-10-CM | POA: Diagnosis present

## 2017-02-10 DIAGNOSIS — I255 Ischemic cardiomyopathy: Secondary | ICD-10-CM | POA: Diagnosis present

## 2017-02-10 DIAGNOSIS — Z9861 Coronary angioplasty status: Secondary | ICD-10-CM | POA: Diagnosis not present

## 2017-02-10 DIAGNOSIS — R001 Bradycardia, unspecified: Secondary | ICD-10-CM | POA: Diagnosis present

## 2017-02-10 DIAGNOSIS — Z7982 Long term (current) use of aspirin: Secondary | ICD-10-CM

## 2017-02-10 DIAGNOSIS — R079 Chest pain, unspecified: Secondary | ICD-10-CM | POA: Diagnosis not present

## 2017-02-10 DIAGNOSIS — I739 Peripheral vascular disease, unspecified: Secondary | ICD-10-CM | POA: Diagnosis present

## 2017-02-10 LAB — TROPONIN I
Troponin I: 0.89 ng/mL (ref <0.03)
Troponin I: 8.83 ng/mL (ref <0.03)
Troponin I: 20.95 ng/mL (ref <0.03)

## 2017-02-10 LAB — BASIC METABOLIC PANEL
Anion gap: 11 (ref 5–15)
BUN: 11 mg/dL (ref 6–20)
CO2: 23 mmol/L (ref 22–32)
Calcium: 9.5 mg/dL (ref 8.9–10.3)
Chloride: 103 mmol/L (ref 101–111)
Creatinine, Ser: 0.91 mg/dL (ref 0.44–1.00)
GFR calc Af Amer: >60 mL/min (ref >60)
GFR calc non Af Amer: 57 mL/min — ABNORMAL LOW (ref >60)
Glucose, Bld: 137 mg/dL — ABNORMAL HIGH (ref 65–99)
Potassium: 4 mmol/L (ref 3.5–5.1)
Sodium: 137 mmol/L (ref 135–145)

## 2017-02-10 LAB — CBC
HCT: 34.4 % — ABNORMAL LOW (ref 35.0–47.0)
Hemoglobin: 11.6 g/dL — ABNORMAL LOW (ref 12.0–16.0)
MCH: 27.5 pg (ref 26.0–34.0)
MCHC: 33.9 g/dL (ref 32.0–36.0)
MCV: 81.2 fL (ref 80.0–100.0)
Platelets: 220 10*3/uL (ref 150–440)
RBC: 4.23 MIL/uL (ref 3.80–5.20)
RDW: 16.6 % — ABNORMAL HIGH (ref 11.5–14.5)
WBC: 7 10*3/uL (ref 3.6–11.0)

## 2017-02-10 LAB — HEPARIN LEVEL (UNFRACTIONATED): Heparin Unfractionated: 0.65 IU/mL (ref 0.30–0.70)

## 2017-02-10 MED ORDER — METOPROLOL TARTRATE 25 MG PO TABS
12.5000 mg | ORAL_TABLET | Freq: Two times a day (BID) | ORAL | Status: DC
  Administered 2017-02-10 – 2017-02-11 (×2): 12.5 mg via ORAL
  Filled 2017-02-10 (×2): qty 1

## 2017-02-10 MED ORDER — PROMETHAZINE HCL 25 MG/ML IJ SOLN
12.5000 mg | Freq: Four times a day (QID) | INTRAMUSCULAR | Status: DC | PRN
  Administered 2017-02-10: 12.5 mg via INTRAVENOUS
  Filled 2017-02-10: qty 1

## 2017-02-10 MED ORDER — PANTOPRAZOLE SODIUM 40 MG PO TBEC
40.0000 mg | DELAYED_RELEASE_TABLET | Freq: Every day | ORAL | Status: DC
  Administered 2017-02-10 – 2017-02-13 (×3): 40 mg via ORAL
  Filled 2017-02-10 (×3): qty 1

## 2017-02-10 MED ORDER — ASPIRIN 81 MG PO CHEW
81.0000 mg | CHEWABLE_TABLET | ORAL | Status: AC
  Administered 2017-02-11: 81 mg via ORAL
  Filled 2017-02-10: qty 1

## 2017-02-10 MED ORDER — NITROGLYCERIN 0.4 MG SL SUBL: 0.4000 mg | SUBLINGUAL_TABLET | SUBLINGUAL | Status: DC | PRN

## 2017-02-10 MED ORDER — LOSARTAN POTASSIUM 50 MG PO TABS
100.0000 mg | ORAL_TABLET | Freq: Every day | ORAL | Status: DC
  Administered 2017-02-10: 100 mg via ORAL
  Filled 2017-02-10: qty 2

## 2017-02-10 MED ORDER — CALCIUM CARBONATE-VITAMIN D 500-200 MG-UNIT PO TABS
2.0000 | ORAL_TABLET | Freq: Every day | ORAL | Status: DC
  Administered 2017-02-10 – 2017-02-13 (×3): 2 via ORAL
  Filled 2017-02-10 (×3): qty 2

## 2017-02-10 MED ORDER — GATIFLOXACIN 0.5 % OP SOLN
1.0000 [drp] | Freq: Four times a day (QID) | OPHTHALMIC | Status: DC
  Administered 2017-02-10 – 2017-02-11 (×2): 1 [drp] via OPHTHALMIC
  Filled 2017-02-10: qty 2.5

## 2017-02-10 MED ORDER — HEPARIN BOLUS VIA INFUSION
3700.0000 [IU] | Freq: Once | INTRAVENOUS | Status: AC
  Administered 2017-02-10: 3700 [IU] via INTRAVENOUS
  Filled 2017-02-10: qty 3700

## 2017-02-10 MED ORDER — ASPIRIN 81 MG PO TBEC
81.0000 mg | DELAYED_RELEASE_TABLET | Freq: Every day | ORAL | Status: DC | PRN
  Filled 2017-02-10: qty 1

## 2017-02-10 MED ORDER — CYANOCOBALAMIN 1000 MCG/ML IJ SOLN: 1000.0000 ug | INTRAMUSCULAR | Status: DC

## 2017-02-10 MED ORDER — ATORVASTATIN CALCIUM 20 MG PO TABS
40.0000 mg | ORAL_TABLET | Freq: Every day | ORAL | Status: DC
  Administered 2017-02-10 – 2017-02-12 (×3): 40 mg via ORAL
  Filled 2017-02-10 (×3): qty 2

## 2017-02-10 MED ORDER — METOPROLOL TARTRATE 25 MG PO TABS: 25.0000 mg | ORAL_TABLET | Freq: Three times a day (TID) | ORAL | Status: DC

## 2017-02-10 MED ORDER — ONDANSETRON HCL 4 MG/2ML IJ SOLN
4.0000 mg | Freq: Four times a day (QID) | INTRAMUSCULAR | Status: DC | PRN
  Administered 2017-02-10: 4 mg via INTRAVENOUS
  Filled 2017-02-10: qty 2

## 2017-02-10 MED ORDER — DIFLUPREDNATE 0.05 % OP EMUL
1.0000 [drp] | Freq: Three times a day (TID) | OPHTHALMIC | Status: DC
  Administered 2017-02-10 – 2017-02-13 (×5): 1 [drp] via OPHTHALMIC
  Filled 2017-02-10: qty 5

## 2017-02-10 MED ORDER — VITAMIN D3 25 MCG (1000 UNIT) PO TABS
2000.0000 [IU] | ORAL_TABLET | Freq: Every day | ORAL | Status: DC
  Administered 2017-02-10 – 2017-02-13 (×3): 2000 [IU] via ORAL
  Filled 2017-02-10 (×7): qty 2

## 2017-02-10 MED ORDER — ISOSORBIDE MONONITRATE ER 30 MG PO TB24
30.0000 mg | ORAL_TABLET | Freq: Every day | ORAL | Status: DC
  Administered 2017-02-10 – 2017-02-12 (×2): 30 mg via ORAL
  Filled 2017-02-10 (×2): qty 1

## 2017-02-10 MED ORDER — HEPARIN (PORCINE) IN NACL 100-0.45 UNIT/ML-% IJ SOLN
750.0000 [IU]/h | INTRAMUSCULAR | Status: DC
  Administered 2017-02-10: 750 [IU]/h via INTRAVENOUS
  Filled 2017-02-10: qty 250

## 2017-02-10 MED ORDER — KETOROLAC TROMETHAMINE 0.5 % OP SOLN
1.0000 [drp] | Freq: Four times a day (QID) | OPHTHALMIC | Status: DC
  Administered 2017-02-10 – 2017-02-13 (×7): 1 [drp] via OPHTHALMIC
  Filled 2017-02-10: qty 3

## 2017-02-10 MED ORDER — ACETAMINOPHEN 325 MG PO TABS: 650.0000 mg | ORAL_TABLET | ORAL | Status: DC | PRN

## 2017-02-10 NOTE — Progress Notes (Signed)
ANTICOAGULATION CONSULT NOTE - Initial Consult  Pharmacy Consult for heparin drip Indication: chest pain/ACS  Allergies  Allergen Reactions  . Buspirone Hcl     REACTION: HEART PALPITATIONS  . Codeine     REACTION: nausea and vomiting  . Hydrochlorothiazide     REACTION: syncope-possibly from dehydration  . Lansoprazole     REACTION: abd pain  . Minocycline Hcl   . Penicillins     REACTION: mouth numbness Has patient had a PCN reaction causing immediate rash, facial/tongue/throat swelling, SOB or lightheadedness with hypotension: No Has patient had a PCN reaction causing severe rash involving mucus membranes or skin necrosis: No Has patient had a PCN reaction that required hospitalization: No Has patient had a PCN reaction occurring within the last 10 years: No If all of the above answers are "NO", then may proceed with Cephalosporin use.  . Ramipril     Cough   . Sulfa Antibiotics Nausea Only    Patient Measurements:   Heparin Dosing Weight: 62 kg HDW = 62 kg. Calculated using RN reported patient weight of 67.1 kg.  Vital Signs: Temp: 98.9 F (37.2 C) (12/03 1911) Temp Source: Oral (12/03 1911) BP: 125/51 (12/03 1911) Pulse Rate: 64 (12/03 1911)  Labs: Recent Labs    02/10/17 1132 02/10/17 1558 02/10/17 2028  HGB 11.6*  --   --   HCT 34.4*  --   --   PLT 220  --   --   HEPARINUNFRC  --   --  0.65  CREATININE 0.91  --   --   TROPONINI 0.89* 8.83* 20.95*    CrCl cannot be calculated (Unknown ideal weight.).  Assessment: Pharmacy consulted to dose and monitor heparin in this 83 year old female for ACS. Baseline labs have been ordered. Patient was not taking any anticoagulants PTA per med rec.   Goal of Therapy:  Heparin level 0.3-0.7 units/ml Monitor platelets by anticoagulation protocol: Yes   Plan:  Give 3700 units bolus x 1 Start heparin infusion at 750 units/hr Check anti-Xa level in 8 hours and daily while on heparin Continue to monitor H&H and  platelets   12/3:  HL @ 20:30 = 0.65 Will continue this pt on current rate and recheck HL in 8 hrs on 12/4 @ 0430.   Meshulem Onorato D, PharmD, Clinical Pharmacist 02/10/2017,10:23 PM

## 2017-02-10 NOTE — Progress Notes (Signed)
ANTICOAGULATION CONSULT NOTE - Initial Consult  Pharmacy Consult for heparin drip Indication: chest pain/ACS  Allergies  Allergen Reactions  . Buspirone Hcl     REACTION: HEART PALPITATIONS  . Codeine     REACTION: nausea and vomiting  . Hydrochlorothiazide     REACTION: syncope-possibly from dehydration  . Lansoprazole     REACTION: abd pain  . Minocycline Hcl   . Penicillins     REACTION: mouth numbness Has patient had a PCN reaction causing immediate rash, facial/tongue/throat swelling, SOB or lightheadedness with hypotension: No Has patient had a PCN reaction causing severe rash involving mucus membranes or skin necrosis: No Has patient had a PCN reaction that required hospitalization: No Has patient had a PCN reaction occurring within the last 10 years: No If all of the above answers are "NO", then may proceed with Cephalosporin use.  . Ramipril     Cough   . Sulfa Antibiotics Nausea Only    Patient Measurements:   Heparin Dosing Weight: 62 kg HDW = 62 kg. Calculated using RN reported patient weight of 67.1 kg.  Vital Signs: Temp: 97.6 F (36.4 C) (12/03 1102) Temp Source: Oral (12/03 1102) BP: 121/49 (12/03 1215) Pulse Rate: 66 (12/03 1215)  Labs: Recent Labs    02/10/17 1132  HGB 11.6*  HCT 34.4*  PLT 220  CREATININE 0.91  TROPONINI 0.89*    CrCl cannot be calculated (Unknown ideal weight.).  Assessment: Pharmacy consulted to dose and monitor heparin in this 83 year old female for ACS. Baseline labs have been ordered. Patient was not taking any anticoagulants PTA per med rec.   Goal of Therapy:  Heparin level 0.3-0.7 units/ml Monitor platelets by anticoagulation protocol: Yes   Plan:  Give 3700 units bolus x 1 Start heparin infusion at 750 units/hr Check anti-Xa level in 8 hours and daily while on heparin Continue to monitor H&H and platelets  Lenis Noon, PharmD, BCPS Clinical Pharmacist 02/10/2017,12:44 PM

## 2017-02-10 NOTE — H&P (View-Only) (Signed)
Cardiology Consultation:   Patient ID: Jenna Young; [id number]; 1932/02/05   Admit date: 02/10/2017 Date of Consult: 02/10/2017  Primary Care Provider: Abner Greenspan, MD Primary Cardiologist: Arvid Right Physician requesting consult Dr. Margaretmary Eddy Reason for consult: NSTEMI   Patient Profile:   Jenna Young is a pleasant 83 year old woman who initially presented withpalpitations, murmur, leg edema,  Prior history of vertigo DCIS Who presents with chest burning, elevated troponin, abnormal EKG concerning for non-STEMI   History of Present Illness:   Jenna Young reports waking at 7:30 this morning with chest burning across her chest Came on at rest, was getting ready to eat breakfast She called her granddaughter who works at a local physician office Family came to evaluate her, called primary care, who referred her to the hospital Patient declined EMT services  Chest pain burning seem to resolve as EKG was done in the emergency room Since then has had no further episodes of burning  Initial lab work showing troponin greater than 0.8 Denies any recent chest burning or pain on exertion at home  Recent stressors, eye surgery among other surgeries Lives by herself Denies significant shortness of breath She has had significant fatigue over the past several months  EKG personally reviewed by myself showing normal sinus rhythm with intraventricular conduction delay ST abnormality 2 3 aVF  Repeat EKG at 2 PM showing normal sinus rhythm intraventricular conduction delay improvement of ST abnormality 2, 3, aVF, T wave abnormalities noted anterolateral lead, 3 and aVF  Telemetry reviewed showing no arrhythmia   Past Medical History:  Diagnosis Date  . Allergic rhinitis   . Anemia   . Anxiety   . GERD (gastroesophageal reflux disease)   . History of radiation therapy 04/29/16- 05/24/16   Left Breast 40.05 Gy in 15 fractions, Left Breast boost 10 Gy in 5 fractions.   .  Hyperlipidemia   . Hypertension    treated by Dr Thurnell Garbe recently with syncope episode  . Osteopenia   . Osteoporosis     Past Surgical History:  Procedure Laterality Date  . APPENDECTOMY    . BREAST LUMPECTOMY WITH RADIOACTIVE SEED LOCALIZATION Left 02/27/2016   Procedure: LEFT BREAST LUMPECTOMY WITH RADIOACTIVE SEED LOCALIZATION;  Surgeon: Autumn Messing III, MD;  Location: [facility];  Service: General;  Laterality: Left;  . BREAST SURGERY  10/10   benign biopsy  negative  . CESAREAN SECTION    . CHOLECYSTECTOMY    . FEMUR FRACTURE SURGERY Right 2006  . OVARIAN CYST SURGERY    . RE-EXCISION OF BREAST LUMPECTOMY Left 04/01/2016   Procedure: RE-EXCISION OF LEFT BREAST MEDIAL MARGIN;  Surgeon: Autumn Messing III, MD;  Location: [facility];  Service: General;  Laterality: Left;     Home Medications:  Prior to Admission medications   Medication Sig Start Date End Date Taking? Authorizing Provider  aspirin (ASPIRIN EC) 81 MG EC tablet Take 81 mg by mouth daily as needed for pain. Swallow whole.    Yes [provider]  Besifloxacin HCl 0.6 % SUSP Place 1 drop into the right eye 3 (three) times daily.   Yes [provider]  Bromfenac Sodium 0.07 % SOLN Place 1 drop into the right eye at bedtime.   Yes [provider]  calcium-vitamin D (OSCAL WITH D) 500-200 MG-UNIT per tablet Take 2 tablets by mouth daily.     Yes [provider]  Cholecalciferol (VITAMIN D) 2000 UNITS CAPS Take 1 capsule by mouth daily.  Yes [provider]  cyanocobalamin (,VITAMIN B-12,) 1000 MCG/ML injection Inject 1,000 mcg into the muscle every 3 (three) months.   Yes [provider]  Difluprednate 0.05 % EMUL Place 1 drop into the right eye 3 (three) times daily.   Yes [provider]  isosorbide mononitrate (IMDUR) 30 MG 24 hr tablet Take 30 mg by mouth daily.   Yes [provider]  losartan (COZAAR) 100 MG tablet  Take 100 mg by mouth daily.   Yes [provider]  omeprazole (PRILOSEC) 20 MG capsule Take 20 mg by mouth daily.   Yes [provider]    Inpatient Medications: Scheduled Meds:  Continuous Infusions: . heparin 750 Units/hr (02/10/17 1316)   PRN Meds:   Allergies:    Allergies  Allergen Reactions  . Buspirone Hcl     REACTION: HEART PALPITATIONS  . Codeine     REACTION: nausea and vomiting  . Hydrochlorothiazide     REACTION: syncope-possibly from dehydration  . Lansoprazole     REACTION: abd pain  . Minocycline Hcl   . Penicillins     REACTION: mouth numbness Has patient had a PCN reaction causing immediate rash, facial/tongue/throat swelling, SOB or lightheadedness with hypotension: No Has patient had a PCN reaction causing severe rash involving mucus membranes or skin necrosis: No Has patient had a PCN reaction that required hospitalization: No Has patient had a PCN reaction occurring within the last 10 years: No If all of the above answers are "NO", then may proceed with Cephalosporin use.  . Ramipril     Cough   . Sulfa Antibiotics Nausea Only    Social History:   Social History   Socioeconomic History  . Marital status: Widowed    Spouse name: Not on file  . Number of children: 1  . Years of education: Not on file  . Highest education level: Not on file  Social Needs  . Financial resource strain: Not on file  . Food insecurity - worry: Not on file  . Food insecurity - inability: Not on file  . Transportation needs - medical: Not on file  . Transportation needs - non-medical: Not on file  Occupational History    Employer: RETIRED  Tobacco Use  . Smoking status: Former Smoker    Last attempt to quit: 03/11/1968    Years since quitting: 48.9  . Smokeless tobacco: Never Used  Substance and Sexual Activity  . Alcohol use: No    Alcohol/week: 0.0 oz  . Drug use: No  . Sexual activity: No  Other Topics Concern  . Not on file  Social  History Narrative  . Not on file    Family History:    Family History  Problem Relation Age of Onset  . Hypertension Mother   . Heart disease Father   . Macular degeneration Sister   . Diabetes Maternal Aunt   . Cancer Maternal Aunt        throat     ROS:  Please see the history of present illness.  Review of Systems  Constitution: Positive for malaise/fatigue. Negative for diaphoresis, fever, weakness and night sweats.  HENT: Negative.   Eyes: Negative.   Cardiovascular: Positive for chest pain. Negative for claudication, cyanosis, dyspnea on exertion, irregular heartbeat, leg swelling, near-syncope, orthopnea, palpitations and paroxysmal nocturnal dyspnea.       Chest burning  Respiratory: Negative for cough, shortness of breath, sleep disturbances due to breathing and wheezing.   Endocrine: Negative.  Hematologic/Lymphatic: Negative.   Skin: Negative.   Musculoskeletal: Negative for falls, joint pain, joint swelling and myalgias.  Gastrointestinal: Negative.   Neurological: Negative for difficulty with concentration, excessive daytime sleepiness, dizziness, focal weakness, light-headedness and numbness.  Psychiatric/Behavioral: Negative.     All other ROS reviewed and negative.     Physical Exam/Data:   Vitals:   02/10/17 1130 02/10/17 1145 02/10/17 1200 02/10/17 1215  BP: (!) 112/43 (!) 118/50 (!) 117/46 (!) 121/49  Pulse: (!) 55 60 62 66  Resp: 19 15 16  (!) 25  Temp:      TempSrc:      SpO2: 100% 100% 100% 100%   No intake or output data in the 24 hours ending 02/10/17 1411 There were no vitals filed for this visit. There is no height or weight on file to calculate BMI.  General:  Well nourished, well developed, in no acute distress HEENT: normal Lymph: no adenopathy Neck: no JVD Endocrine:  No thryomegaly Vascular: No carotid bruits; FA pulses 2+ bilaterally without bruits  Cardiac:  normal S1, S2; RRR; no murmur  Lungs:  clear to auscultation  bilaterally, no wheezing, rhonchi or rales  Abd: soft, nontender, no hepatomegaly  Ext: no edema Musculoskeletal:  No deformities, BUE and BLE strength normal and equal Skin: warm and dry  Neuro:  CNs 2-12 intact, no focal abnormalities noted Psych:  Normal affect    Relevant CV Studies:  Echocardiogram pending  Laboratory Data:  Chemistry Recent Labs  Lab 02/10/17 1132  NA 137  K 4.0  CL 103  CO2 23  GLUCOSE 137*  BUN 11  CREATININE 0.91  CALCIUM 9.5  GFRNONAA 57*  GFRAA >60  ANIONGAP 11    No results for input(s): PROT, ALBUMIN, AST, ALT, ALKPHOS, BILITOT in the last 168 hours. Hematology Recent Labs  Lab 02/10/17 1132  WBC 7.0  RBC 4.23  HGB 11.6*  HCT 34.4*  MCV 81.2  MCH 27.5  MCHC 33.9  RDW 16.6*  PLT 220   Cardiac Enzymes Recent Labs  Lab 02/10/17 1132  TROPONINI 0.89*   No results for input(s): TROPIPOC in the last 168 hours.  BNPNo results for input(s): BNP, PROBNP in the last 168 hours.  DDimer No results for input(s): DDIMER in the last 168 hours.  Radiology/Studies:  Dg Chest 2 View  Result Date: 02/10/2017 CLINICAL DATA:  Onset of chest pain around 7:30 this morning and has persisted associated with nausea with some diaphoresis. EXAM: CHEST  2 VIEW COMPARISON:  Portable chest x-ray of January 14, 2016 FINDINGS: The lungs are adequately inflated. The interstitial markings remain mildly increased. The cardiac silhouette is enlarged and more conspicuous today. The pulmonary vascularity is normal. There is calcification in the wall of the thoracic aorta. The bony thorax exhibits no acute abnormality. IMPRESSION: Mild cardiomegaly without pulmonary vascular congestion. No alveolar pneumonia. Chronic bronchitic-smoking related changes, stable. Thoracic aortic atherosclerosis. Electronically Signed   By: David  Martinique M.D.   On: 02/10/2017 11:24    Assessment and Plan:   1. NSTEMI Abnormal EKG on arrival with chest burning and elevated troponin  concerning for underlying ischemia Pain resolved at 11 AM before medications were started in the emergency room Currently on heparin, pain-free Repeat EKG shows improved ST abnormality inferior leads Feel she is at her baseline Family at the bedside Long discussion concerning various workup, we have recommended cardiac catheterization given the above I have reviewed the risks, indications, and alternatives to cardiac catheterization, possible angioplasty, and  stenting with the patient. Risks include but are not limited to bleeding, infection, vascular injury, stroke, myocardial infection, arrhythmia, kidney injury, radiation-related injury in the case of prolonged fluoroscopy use, emergency cardiac surgery, and death. The patient understands the risks of serious complication is 1-2 in 9191 with diagnostic cardiac cath and 1-2% or less with angioplasty/stenting.  -Heparin, aspirin, statin, beta-blocker  2) hyperlipidemia Needs high-dose statin   The above was discussed with family detail  Total encounter time more than 110 minutes  Greater than 50% was spent in counseling and coordination of care with the patient  For questions or updates, please contact [facility] Please consult [email or web address] for contact info under Cardiology/STEMI.   Signed, Ida Rogue, MD  02/10/2017 2:11 PM

## 2017-02-10 NOTE — ED Triage Notes (Signed)
Pt with chest pain started this am.

## 2017-02-10 NOTE — Consult Note (Signed)
Cardiology Consultation:   Patient ID: Jenna Young; [id number]; 04-25-1931   Admit date: 02/10/2017 Date of Consult: 02/10/2017  Primary Care Provider: Abner Greenspan, MD Primary Cardiologist: Arvid Right Physician requesting consult Dr. Margaretmary Eddy Reason for consult: NSTEMI   Patient Profile:   Jenna Young is a pleasant 83 year old woman who initially presented withpalpitations, murmur, leg edema,  Prior history of vertigo DCIS Who presents with chest burning, elevated troponin, abnormal EKG concerning for non-STEMI   History of Present Illness:   Jenna Young reports waking at 7:30 this morning with chest burning across her chest Came on at rest, was getting ready to eat breakfast She called her granddaughter who works at a local physician office Family came to evaluate her, called primary care, who referred her to the hospital Patient declined EMT services  Chest pain burning seem to resolve as EKG was done in the emergency room Since then has had no further episodes of burning  Initial lab work showing troponin greater than 0.8 Denies any recent chest burning or pain on exertion at home  Recent stressors, eye surgery among other surgeries Lives by herself Denies significant shortness of breath She has had significant fatigue over the past several months  EKG personally reviewed by myself showing normal sinus rhythm with intraventricular conduction delay ST abnormality 2 3 aVF  Repeat EKG at 2 PM showing normal sinus rhythm intraventricular conduction delay improvement of ST abnormality 2, 3, aVF, T wave abnormalities noted anterolateral lead, 3 and aVF  Telemetry reviewed showing no arrhythmia   Past Medical History:  Diagnosis Date  . Allergic rhinitis   . Anemia   . Anxiety   . GERD (gastroesophageal reflux disease)   . History of radiation therapy 04/29/16- 05/24/16   Left Breast 40.05 Gy in 15 fractions, Left Breast boost 10 Gy in 5 fractions.   .  Hyperlipidemia   . Hypertension    treated by Dr Thurnell Garbe recently with syncope episode  . Osteopenia   . Osteoporosis     Past Surgical History:  Procedure Laterality Date  . APPENDECTOMY    . BREAST LUMPECTOMY WITH RADIOACTIVE SEED LOCALIZATION Left 02/27/2016   Procedure: LEFT BREAST LUMPECTOMY WITH RADIOACTIVE SEED LOCALIZATION;  Surgeon: Autumn Messing III, MD;  Location: [facility];  Service: General;  Laterality: Left;  . BREAST SURGERY  10/10   benign biopsy  negative  . CESAREAN SECTION    . CHOLECYSTECTOMY    . FEMUR FRACTURE SURGERY Right 2006  . OVARIAN CYST SURGERY    . RE-EXCISION OF BREAST LUMPECTOMY Left 04/01/2016   Procedure: RE-EXCISION OF LEFT BREAST MEDIAL MARGIN;  Surgeon: Autumn Messing III, MD;  Location: [facility];  Service: General;  Laterality: Left;     Home Medications:  Prior to Admission medications   Medication Sig Start Date End Date Taking? Authorizing Provider  aspirin (ASPIRIN EC) 81 MG EC tablet Take 81 mg by mouth daily as needed for pain. Swallow whole.    Yes [provider]  Besifloxacin HCl 0.6 % SUSP Place 1 drop into the right eye 3 (three) times daily.   Yes [provider]  Bromfenac Sodium 0.07 % SOLN Place 1 drop into the right eye at bedtime.   Yes [provider]  calcium-vitamin D (OSCAL WITH D) 500-200 MG-UNIT per tablet Take 2 tablets by mouth daily.     Yes [provider]  Cholecalciferol (VITAMIN D) 2000 UNITS CAPS Take 1 capsule by mouth daily.  Yes [provider]  cyanocobalamin (,VITAMIN B-12,) 1000 MCG/ML injection Inject 1,000 mcg into the muscle every 3 (three) months.   Yes [provider]  Difluprednate 0.05 % EMUL Place 1 drop into the right eye 3 (three) times daily.   Yes [provider]  isosorbide mononitrate (IMDUR) 30 MG 24 hr tablet Take 30 mg by mouth daily.   Yes [provider]  losartan (COZAAR) 100 MG tablet  Take 100 mg by mouth daily.   Yes [provider]  omeprazole (PRILOSEC) 20 MG capsule Take 20 mg by mouth daily.   Yes [provider]    Inpatient Medications: Scheduled Meds:  Continuous Infusions: . heparin 750 Units/hr (02/10/17 1316)   PRN Meds:   Allergies:    Allergies  Allergen Reactions  . Buspirone Hcl     REACTION: HEART PALPITATIONS  . Codeine     REACTION: nausea and vomiting  . Hydrochlorothiazide     REACTION: syncope-possibly from dehydration  . Lansoprazole     REACTION: abd pain  . Minocycline Hcl   . Penicillins     REACTION: mouth numbness Has patient had a PCN reaction causing immediate rash, facial/tongue/throat swelling, SOB or lightheadedness with hypotension: No Has patient had a PCN reaction causing severe rash involving mucus membranes or skin necrosis: No Has patient had a PCN reaction that required hospitalization: No Has patient had a PCN reaction occurring within the last 10 years: No If all of the above answers are "NO", then may proceed with Cephalosporin use.  . Ramipril     Cough   . Sulfa Antibiotics Nausea Only    Social History:   Social History   Socioeconomic History  . Marital status: Widowed    Spouse name: Not on file  . Number of children: 1  . Years of education: Not on file  . Highest education level: Not on file  Social Needs  . Financial resource strain: Not on file  . Food insecurity - worry: Not on file  . Food insecurity - inability: Not on file  . Transportation needs - medical: Not on file  . Transportation needs - non-medical: Not on file  Occupational History    Employer: RETIRED  Tobacco Use  . Smoking status: Former Smoker    Last attempt to quit: 03/11/1968    Years since quitting: 48.9  . Smokeless tobacco: Never Used  Substance and Sexual Activity  . Alcohol use: No    Alcohol/week: 0.0 oz  . Drug use: No  . Sexual activity: No  Other Topics Concern  . Not on file  Social  History Narrative  . Not on file    Family History:    Family History  Problem Relation Age of Onset  . Hypertension Mother   . Heart disease Father   . Macular degeneration Sister   . Diabetes Maternal Aunt   . Cancer Maternal Aunt        throat     ROS:  Please see the history of present illness.  Review of Systems  Constitution: Positive for malaise/fatigue. Negative for diaphoresis, fever, weakness and night sweats.  HENT: Negative.   Eyes: Negative.   Cardiovascular: Positive for chest pain. Negative for claudication, cyanosis, dyspnea on exertion, irregular heartbeat, leg swelling, near-syncope, orthopnea, palpitations and paroxysmal nocturnal dyspnea.       Chest burning  Respiratory: Negative for cough, shortness of breath, sleep disturbances due to breathing and wheezing.   Endocrine: Negative.  Hematologic/Lymphatic: Negative.   Skin: Negative.   Musculoskeletal: Negative for falls, joint pain, joint swelling and myalgias.  Gastrointestinal: Negative.   Neurological: Negative for difficulty with concentration, excessive daytime sleepiness, dizziness, focal weakness, light-headedness and numbness.  Psychiatric/Behavioral: Negative.     All other ROS reviewed and negative.     Physical Exam/Data:   Vitals:   02/10/17 1130 02/10/17 1145 02/10/17 1200 02/10/17 1215  BP: (!) 112/43 (!) 118/50 (!) 117/46 (!) 121/49  Pulse: (!) 55 60 62 66  Resp: 19 15 16  (!) 25  Temp:      TempSrc:      SpO2: 100% 100% 100% 100%   No intake or output data in the 24 hours ending 02/10/17 1411 There were no vitals filed for this visit. There is no height or weight on file to calculate BMI.  General:  Well nourished, well developed, in no acute distress HEENT: normal Lymph: no adenopathy Neck: no JVD Endocrine:  No thryomegaly Vascular: No carotid bruits; FA pulses 2+ bilaterally without bruits  Cardiac:  normal S1, S2; RRR; no murmur  Lungs:  clear to auscultation  bilaterally, no wheezing, rhonchi or rales  Abd: soft, nontender, no hepatomegaly  Ext: no edema Musculoskeletal:  No deformities, BUE and BLE strength normal and equal Skin: warm and dry  Neuro:  CNs 2-12 intact, no focal abnormalities noted Psych:  Normal affect    Relevant CV Studies:  Echocardiogram pending  Laboratory Data:  Chemistry Recent Labs  Lab 02/10/17 1132  NA 137  K 4.0  CL 103  CO2 23  GLUCOSE 137*  BUN 11  CREATININE 0.91  CALCIUM 9.5  GFRNONAA 57*  GFRAA >60  ANIONGAP 11    No results for input(s): PROT, ALBUMIN, AST, ALT, ALKPHOS, BILITOT in the last 168 hours. Hematology Recent Labs  Lab 02/10/17 1132  WBC 7.0  RBC 4.23  HGB 11.6*  HCT 34.4*  MCV 81.2  MCH 27.5  MCHC 33.9  RDW 16.6*  PLT 220   Cardiac Enzymes Recent Labs  Lab 02/10/17 1132  TROPONINI 0.89*   No results for input(s): TROPIPOC in the last 168 hours.  BNPNo results for input(s): BNP, PROBNP in the last 168 hours.  DDimer No results for input(s): DDIMER in the last 168 hours.  Radiology/Studies:  Dg Chest 2 View  Result Date: 02/10/2017 CLINICAL DATA:  Onset of chest pain around 7:30 this morning and has persisted associated with nausea with some diaphoresis. EXAM: CHEST  2 VIEW COMPARISON:  Portable chest x-ray of January 14, 2016 FINDINGS: The lungs are adequately inflated. The interstitial markings remain mildly increased. The cardiac silhouette is enlarged and more conspicuous today. The pulmonary vascularity is normal. There is calcification in the wall of the thoracic aorta. The bony thorax exhibits no acute abnormality. IMPRESSION: Mild cardiomegaly without pulmonary vascular congestion. No alveolar pneumonia. Chronic bronchitic-smoking related changes, stable. Thoracic aortic atherosclerosis. Electronically Signed   By: David  Martinique M.D.   On: 02/10/2017 11:24    Assessment and Plan:   1. NSTEMI Abnormal EKG on arrival with chest burning and elevated troponin  concerning for underlying ischemia Pain resolved at 11 AM before medications were started in the emergency room Currently on heparin, pain-free Repeat EKG shows improved ST abnormality inferior leads Feel she is at her baseline Family at the bedside Long discussion concerning various workup, we have recommended cardiac catheterization given the above I have reviewed the risks, indications, and alternatives to cardiac catheterization, possible angioplasty, and  stenting with the patient. Risks include but are not limited to bleeding, infection, vascular injury, stroke, myocardial infection, arrhythmia, kidney injury, radiation-related injury in the case of prolonged fluoroscopy use, emergency cardiac surgery, and death. The patient understands the risks of serious complication is 1-2 in 1115 with diagnostic cardiac cath and 1-2% or less with angioplasty/stenting.  -Heparin, aspirin, statin, beta-blocker  2) hyperlipidemia Needs high-dose statin   The above was discussed with family detail  Total encounter time more than 110 minutes  Greater than 50% was spent in counseling and coordination of care with the patient  For questions or updates, please contact [facility] Please consult [email or web address] for contact info under Cardiology/STEMI.   Signed, Ida Rogue, MD  02/10/2017 2:11 PM

## 2017-02-10 NOTE — ED Notes (Signed)
Dr. Corky Downs aware of troponin of 0.89.

## 2017-02-10 NOTE — Telephone Encounter (Signed)
Phone call from pt's granddaughter.  Reported "c/o chest pain left chest near collar bone."  Reported pt. complained that chest pain started this morning, about 7:30 AM, when she woke up, and has been constant..  Reported pt. c/o nausea, and noted that skin is alternating between sweaty to cool/ clammy.  Rated pain at 8/10.  Denied any radiation of the pain.  Described as a "burning" pain.  Per protocol, advised to call EMS.  Granddaughter agreed with calling EMS.          Reason for Disposition . SEVERE chest pain . [1] Chest pain lasts > 5 minutes AND [2] age > 42  Answer Assessment - Initial Assessment Questions 1. LOCATION: "Where does it hurt?"       Upper left chest near collar bone 2. RADIATION: "Does the pain go anywhere else?" (e.g., into neck, jaw, arms, back)     No radiation of pain 3. ONSET: "When did the chest pain begin?" (Minutes, hours or days)      This morning when she woke up about 7:30 AM; constant   4. PATTERN "Does the pain come and go, or has it been constant since it started?"  "Does it get worse with exertion?"      Constant 5. DURATION: "How long does it last" (e.g., seconds, minutes, hours)     Continued 6. SEVERITY: "How bad is the pain?"  (e.g., Scale 1-10; mild, moderate, or severe)    - MILD (1-3): doesn't interfere with normal activities     - MODERATE (4-7): interferes with normal activities or awakens from sleep    - SEVERE (8-10): excruciating pain, unable to do any normal activities       8/10 7. CARDIAC RISK FACTORS: "Do you have any history of heart problems or risk factors for heart disease?" (e.g., prior heart attack, angina; high blood pressure, diabetes, being overweight, high cholesterol, smoking, or strong family history of heart disease)     High blood pressure;  8. PULMONARY RISK FACTORS: "Do you have any history of lung disease?"  (e.g., blood clots in lung, asthma, emphysema, birth control pills)     no 9. CAUSE: "What do you think is causing  the chest pain?"    No  10. OTHER SYMPTOMS: "Do you have any other symptoms?" (e.g., dizziness, nausea, vomiting, sweating, fever, difficulty breathing, cough)      Nausea, diarrhea, alt. sweating with cool/ clammy; pale   no 11. PREGNANCY: "Is there any chance you are pregnant?" "When was your last menstrual period?"       n/a  Protocols used: CHEST PAIN-A-AH

## 2017-02-10 NOTE — ED Provider Notes (Signed)
[facility] Emergency Department Provider Note   ____________________________________________    I have reviewed the triage vital signs and the nursing notes.   HISTORY  Chief Complaint Chest Pain and Bradycardia     HPI Jenna Young is a 83 y.o. female who presents with complaints of chest pain as well as some mild dizziness.  Patient reports she was getting her breakfast ready today and developed a burning sensation in her central chest, she does admit to feeling somewhat tight.  She felt nauseated and vomited and then vomited again in the emergency department waiting room.  Now she reports she is feeling significantly better.  No longer has any chest discomfort.  No shortness of breath.  No pleurisy.  No fevers chills cough.  No history of heart disease.  She has not taken anything for this.   Past Medical History:  Diagnosis Date  . Allergic rhinitis   . Anemia   . Anxiety   . GERD (gastroesophageal reflux disease)   . History of radiation therapy 04/29/16- 05/24/16   Left Breast 40.05 Gy in 15 fractions, Left Breast boost 10 Gy in 5 fractions.   . Hyperlipidemia   . Hypertension    treated by Dr Thurnell Garbe recently with syncope episode  . Osteopenia   . Osteoporosis     Patient Active Problem List   Diagnosis Date Noted  . NSTEMI (non-ST elevated myocardial infarction) ([place]) 02/10/2017  . Carcinoma of central portion of left breast in female, estrogen receptor negative ([place]) 04/17/2016  . Ductal carcinoma in situ (DCIS) of left breast 02/26/2016  . Varicose vein of leg 10/09/2015  . Fatigue 10/09/2015  . Pedal edema 10/09/2015  . Heart murmur 10/09/2015  . History of spinal fracture 05/07/2015  . Lumbar pain 05/03/2015  . Lipoma of face 11/08/2014  . Dysthymia 09/20/2013  . Encounter for Medicare annual wellness exam 02/03/2013  . BPPV (benign paroxysmal positional vertigo) 06/15/2012  . Chronic right ear pain 03/16/2012  .  Colon cancer screening 01/17/2012  . Anemia 01/17/2012  . GRIEF REACTION 08/01/2009  . Vitamin D deficiency 08/19/2008  . ANXIETY 01/29/2008  . Essential hypertension, benign 09/25/2007  . B12 deficiency 09/24/2006  . Hyperlipidemia 09/24/2006  . Venous (peripheral) insufficiency 09/24/2006  . ALLERGIC RHINITIS 09/24/2006  . GERD 09/24/2006  . Osteoporosis 09/24/2006    Past Surgical History:  Procedure Laterality Date  . APPENDECTOMY    . BREAST LUMPECTOMY WITH RADIOACTIVE SEED LOCALIZATION Left 02/27/2016   Procedure: LEFT BREAST LUMPECTOMY WITH RADIOACTIVE SEED LOCALIZATION;  Surgeon: Autumn Messing III, MD;  Location: [place];  Service: General;  Laterality: Left;  . BREAST SURGERY  10/10   benign biopsy  negative  . CESAREAN SECTION    . CHOLECYSTECTOMY    . FEMUR FRACTURE SURGERY Right 2006  . OVARIAN CYST SURGERY    . RE-EXCISION OF BREAST LUMPECTOMY Left 04/01/2016   Procedure: RE-EXCISION OF LEFT BREAST MEDIAL MARGIN;  Surgeon: Autumn Messing III, MD;  Location: [place];  Service: General;  Laterality: Left;    Prior to Admission medications   Medication Sig Start Date End Date Taking? Authorizing Provider  aspirin (ASPIRIN EC) 81 MG EC tablet Take 81 mg by mouth daily as needed for pain. Swallow whole.    Yes [provider]  Besifloxacin HCl 0.6 % SUSP Place 1 drop into the right eye 3 (three) times daily.   Yes [provider]  Bromfenac Sodium 0.07 %  SOLN Place 1 drop into the right eye at bedtime.   Yes [provider]  calcium-vitamin D (OSCAL WITH D) 500-200 MG-UNIT per tablet Take 2 tablets by mouth daily.     Yes [provider]  Cholecalciferol (VITAMIN D) 2000 UNITS CAPS Take 1 capsule by mouth daily.     Yes [provider]  cyanocobalamin (,VITAMIN B-12,) 1000 MCG/ML injection Inject 1,000 mcg into the muscle every 3 (three) months.   Yes [provider]  Difluprednate 0.05 % EMUL  Place 1 drop into the right eye 3 (three) times daily.   Yes [provider]  isosorbide mononitrate (IMDUR) 30 MG 24 hr tablet Take 30 mg by mouth daily.   Yes [provider]  losartan (COZAAR) 100 MG tablet Take 100 mg by mouth daily.   Yes [provider]  omeprazole (PRILOSEC) 20 MG capsule Take 20 mg by mouth daily.   Yes [provider]     Allergies Buspirone hcl; Codeine; Hydrochlorothiazide; Lansoprazole; Minocycline hcl; Penicillins; Ramipril; and Sulfa antibiotics  Family History  Problem Relation Age of Onset  . Hypertension Mother   . Heart disease Father   . Macular degeneration Sister   . Diabetes Maternal Aunt   . Cancer Maternal Aunt        throat    Social History Social History   Tobacco Use  . Smoking status: Former Smoker    Last attempt to quit: 03/11/1968    Years since quitting: 48.9  . Smokeless tobacco: Never Used  Substance Use Topics  . Alcohol use: No    Alcohol/week: 0.0 oz  . Drug use: No    Review of Systems  Constitutional: No fever/chills Eyes: No visual changes.  ENT: No sore throat. Cardiovascular: As above Respiratory: Denies shortness of breath. Gastrointestinal: No abdominal pain.   Genitourinary: Negative for dysuria. Musculoskeletal: Negative for back pain. Skin: Negative for rash. Neurological: Negative for headaches or weakness   ____________________________________________   PHYSICAL EXAM:  VITAL SIGNS: ED Triage Vitals  Enc Vitals Group     BP 02/10/17 1109 (!) 125/51     Pulse Rate 02/10/17 1102 (!) 43     Resp 02/10/17 1102 18     Temp 02/10/17 1102 97.6 F (36.4 C)     Temp Source 02/10/17 1102 Oral     SpO2 02/10/17 1102 99 %     Weight --      Height --      Head Circumference --      Peak Flow --      Pain Score --      Pain Loc --      Pain Edu? --      Excl. in [place]? --     Constitutional: Alert and oriented. No acute distress. Pleasant and interactive Eyes:  Conjunctivae are normal.  Head: Atraumatic. Nose: No congestion/rhinnorhea.  Cardiovascular: Bradycardia, regular rhythm. Grossly normal heart sounds.  Good peripheral circulation. Respiratory: Normal respiratory effort.  No retractions. Lungs CTAB. Gastrointestinal: Soft and nontender. No distention.  No CVA tenderness. Genitourinary: deferred Musculoskeletal: No lower extremity tenderness nor edema.  Warm and well perfused Neurologic:  Normal speech and language. No gross focal neurologic deficits are appreciated.  Skin:  Skin is warm, dry and intact. No rash noted. Psychiatric: Mood and affect are normal. Speech and behavior are normal.  ____________________________________________   LABS (all labs ordered are listed, but only abnormal results are displayed)  Labs Reviewed  BASIC METABOLIC  PANEL - Abnormal; Notable for the following components:      Result Value   Glucose, Bld 137 (*)    GFR calc non Af Amer 57 (*)    All other components within normal limits  CBC - Abnormal; Notable for the following components:   Hemoglobin 11.6 (*)    HCT 34.4 (*)    RDW 16.6 (*)    All other components within normal limits  TROPONIN I - Abnormal; Notable for the following components:   Troponin I 0.89 (*)    All other components within normal limits   ____________________________________________  EKG  ED ECG REPORT I, Lavonia Drafts, the attending physician, personally viewed and interpreted this ECG.  Date: 02/10/2017  Rhythm: normal sinus rhythm QRS Axis: normal Intervals: normal ST/T Wave abnormalities: Abnormal ST/T wave in lead III discussed with Dr. Fletcher Anon of cardiology upon receiving EKG, he does not feel there is indication for the Cath Lab at this time   ____________________________________________  RADIOLOGY  Chest x-ray unremarkable, mild cardiomegaly ____________________________________________   PROCEDURES  Procedure(s) performed:  No  Procedures   Critical Care performed: yes  CRITICAL CARE Performed by: Lavonia Drafts   Total critical care time:35 minutes  Critical care time was exclusive of separately billable procedures and treating other patients.  Critical care was necessary to treat or prevent imminent or life-threatening deterioration.  Critical care was time spent personally by me on the following activities: development of treatment plan with patient and/or surrogate as well as nursing, discussions with consultants, evaluation of patient's response to treatment, examination of patient, obtaining history from patient or surrogate, ordering and performing treatments and interventions, ordering and review of laboratory studies, ordering and review of radiographic studies, pulse oximetry and re-evaluation of patient's condition.  ____________________________________________   INITIAL IMPRESSION / ASSESSMENT AND PLAN / ED COURSE  Pertinent labs & imaging results that were available during my care of the patient were reviewed by me and considered in my medical decision making (see chart for details).  Patient presents with chest pain and bradycardia with an episode of vomiting.  This is highly concerning for ACS, differential would also include gastritis, PUD, esophagitis.  Discussed EKG with Dr. Fletcher Anon upon receiving EKG, he noted abnormality inferiorly but does not feel there is an indication for cath lab emergently at this time   ----------------------------------------- 12:46 PM on 02/10/2017 -----------------------------------------  Patient with significantly elevated troponin, will start heparin, admitted to the hospitalist service    ____________________________________________   FINAL CLINICAL IMPRESSION(S) / ED DIAGNOSES  Final diagnoses:  NSTEMI (non-ST elevated myocardial infarction) [facility])        Note:  This document was prepared using Dragon voice recognition software and may  include unintentional dictation errors.    Lavonia Drafts, MD 02/10/17 1246

## 2017-02-10 NOTE — Progress Notes (Signed)
Dr. Fletcher Anon made aware of troponin of 8.83 and made aware of 7 beats of v.tach on tele per CCMD/ pt asymptomatic..heparin infusing/ plan for cath tomorrow/ will continue to monitor

## 2017-02-10 NOTE — H&P (Signed)
[facility] [place] NAME: Jenna Young    MR#:  [id number]  DATE OF BIRTH:  1931-07-28  DATE OF ADMISSION:  02/10/2017  PRIMARY CARE PHYSICIAN: Tower, [name], MD   REQUESTING/REFERRING PHYSICIAN: kinner  CHIEF COMPLAINT:   Chest pain HISTORY OF PRESENT ILLNESS:  Jenna Young  is a 83 y.o. female with a known history of GERD, anxiety, hypertension, hyperlipidemia and peripheral vascular disease is presenting to the ED with a chief complaint of burning in the chest. Patient is reporting that it started at around 7:30 AM while she was trying to fix her breakfast. Denies any shortness of breath. She felt   some squeezing sensation in the left side of her Chest. Denies any dizziness or loss of consciousness. In the ED troponin is elevated at 0.89 and EKG has revealed ST wave changes in lead 3 and aVF. Patient is started on heparin drip and hospitalist team is called to admit the patient. During my examination patient reports that chest discomfort eased off. Son and grandchildren are at bedside. PAST MEDICAL HISTORY:   Past Medical History:  Diagnosis Date  . Allergic rhinitis   . Anemia   . Anxiety   . GERD (gastroesophageal reflux disease)   . History of radiation therapy 04/29/16- 05/24/16   Left Breast 40.05 Gy in 15 fractions, Left Breast boost 10 Gy in 5 fractions.   . Hyperlipidemia   . Hypertension    treated by Dr Thurnell Garbe recently with syncope episode  . Osteopenia   . Osteoporosis     PAST SURGICAL HISTOIRY:   Past Surgical History:  Procedure Laterality Date  . APPENDECTOMY    . BREAST LUMPECTOMY WITH RADIOACTIVE SEED LOCALIZATION Left 02/27/2016   Procedure: LEFT BREAST LUMPECTOMY WITH RADIOACTIVE SEED LOCALIZATION;  Surgeon: Autumn Messing III, MD;  Location: [facility];  Service: General;  Laterality: Left;  . BREAST SURGERY  10/10   benign biopsy  negative  . CESAREAN SECTION    . CHOLECYSTECTOMY     . FEMUR FRACTURE SURGERY Right 2006  . OVARIAN CYST SURGERY    . RE-EXCISION OF BREAST LUMPECTOMY Left 04/01/2016   Procedure: RE-EXCISION OF LEFT BREAST MEDIAL MARGIN;  Surgeon: Autumn Messing III, MD;  Location: [place];  Service: General;  Laterality: Left;    SOCIAL HISTORY:   Social History   Tobacco Use  . Smoking status: Former Smoker    Last attempt to quit: 03/11/1968    Years since quitting: 48.9  . Smokeless tobacco: Never Used  Substance Use Topics  . Alcohol use: No    Alcohol/week: 0.0 oz    FAMILY HISTORY:   Family History  Problem Relation Age of Onset  . Hypertension Mother   . Heart disease Father   . Macular degeneration Sister   . Diabetes Maternal Aunt   . Cancer Maternal Aunt        throat    DRUG ALLERGIES:   Allergies  Allergen Reactions  . Buspirone Hcl     REACTION: HEART PALPITATIONS  . Codeine     REACTION: nausea and vomiting  . Hydrochlorothiazide     REACTION: syncope-possibly from dehydration  . Lansoprazole     REACTION: abd pain  . Minocycline Hcl   . Penicillins     REACTION: mouth numbness Has patient had a PCN reaction causing immediate rash, facial/tongue/throat swelling, SOB or lightheadedness with hypotension: No Has patient had a PCN reaction  causing severe rash involving mucus membranes or skin necrosis: No Has patient had a PCN reaction that required hospitalization: No Has patient had a PCN reaction occurring within the last 10 years: No If all of the above answers are "NO", then may proceed with Cephalosporin use.  . Ramipril     Cough   . Sulfa Antibiotics Nausea Only    REVIEW OF SYSTEMS:  CONSTITUTIONAL: No fever, fatigue or weakness.  EYES: No blurred or double vision.  EARS, NOSE, AND THROAT: No tinnitus or ear pain.  RESPIRATORY: No cough, shortness of breath, wheezing or hemoptysis.  CARDIOVASCULAR: No chest pain, orthopnea, edema.  Reporting burning in the chest GASTROINTESTINAL: No  nausea, vomiting, diarrhea or abdominal pain.  GENITOURINARY: No dysuria, hematuria.  ENDOCRINE: No polyuria, nocturia,  HEMATOLOGY: No anemia, easy bruising or bleeding SKIN: No rash or lesion. MUSCULOSKELETAL: No joint pain or arthritis.   NEUROLOGIC: No tingling, numbness, weakness.  PSYCHIATRY: No anxiety or depression.   MEDICATIONS AT HOME:   Prior to Admission medications   Medication Sig Start Date End Date Taking? Authorizing Provider  aspirin (ASPIRIN EC) 81 MG EC tablet Take 81 mg by mouth daily as needed for pain. Swallow whole.    Yes [provider]  Besifloxacin HCl 0.6 % SUSP Place 1 drop into the right eye 3 (three) times daily.   Yes [provider]  Bromfenac Sodium 0.07 % SOLN Place 1 drop into the right eye at bedtime.   Yes [provider]  calcium-vitamin D (OSCAL WITH D) 500-200 MG-UNIT per tablet Take 2 tablets by mouth daily.     Yes [provider]  Cholecalciferol (VITAMIN D) 2000 UNITS CAPS Take 1 capsule by mouth daily.     Yes [provider]  cyanocobalamin (,VITAMIN B-12,) 1000 MCG/ML injection Inject 1,000 mcg into the muscle every 3 (three) months.   Yes [provider]  Difluprednate 0.05 % EMUL Place 1 drop into the right eye 3 (three) times daily.   Yes [provider]  isosorbide mononitrate (IMDUR) 30 MG 24 hr tablet Take 30 mg by mouth daily.   Yes [provider]  losartan (COZAAR) 100 MG tablet Take 100 mg by mouth daily.   Yes [provider]  omeprazole (PRILOSEC) 20 MG capsule Take 20 mg by mouth daily.   Yes [provider]      VITAL SIGNS:  Blood pressure (!) 121/49, pulse 66, temperature 97.6 F (36.4 C), temperature source Oral, resp. rate (!) 25, SpO2 100 %.  PHYSICAL EXAMINATION:  GENERAL:  83 y.o.-year-old patient lying in the bed with no acute distress.  EYES: Pupils equal, round, reactive to light and accommodation. No scleral icterus.  Extraocular muscles intact.  HEENT: Head atraumatic, normocephalic. Oropharynx and nasopharynx clear.  NECK:  Supple, no jugular venous distention. No thyroid enlargement, no tenderness.  LUNGS: Normal breath sounds bilaterally, no wheezing, rales,rhonchi or crepitation. No use of accessory muscles of respiration.  CARDIOVASCULAR: S1, S2 normal. No murmurs, rubs, or gallops. No anterior chest wall tenderness on palpation ABDOMEN: Soft, nontender, nondistended. Bowel sounds present. No organomegaly or mass.  EXTREMITIES: No pedal edema, cyanosis, or clubbing.  NEUROLOGIC: Cranial nerves II through XII are intact. Muscle strength 5/5 in all extremities. Sensation intact. Gait not checked.  PSYCHIATRIC: The patient is alert and oriented x 3.  SKIN: No obvious rash, lesion, or ulcer.   LABORATORY PANEL:   CBC Recent Labs  Lab 02/10/17 1132  WBC 7.0  HGB 11.6*  HCT 34.4*  PLT 220   ------------------------------------------------------------------------------------------------------------------  Chemistries  Recent Labs  Lab 02/10/17 1132  NA 137  K 4.0  CL 103  CO2 23  GLUCOSE 137*  BUN 11  CREATININE 0.91  CALCIUM 9.5   ------------------------------------------------------------------------------------------------------------------  Cardiac Enzymes Recent Labs  Lab 02/10/17 1132  TROPONINI 0.89*   ------------------------------------------------------------------------------------------------------------------  RADIOLOGY:  Dg Chest 2 View  Result Date: 02/10/2017 CLINICAL DATA:  Onset of chest pain around 7:30 this morning and has persisted associated with nausea with some diaphoresis. EXAM: CHEST  2 VIEW COMPARISON:  Portable chest x-ray of January 14, 2016 FINDINGS: The lungs are adequately inflated. The interstitial markings remain mildly increased. The cardiac silhouette is enlarged and more conspicuous today. The pulmonary vascularity is normal. There is  calcification in the wall of the thoracic aorta. The bony thorax exhibits no acute abnormality. IMPRESSION: Mild cardiomegaly without pulmonary vascular congestion. No alveolar pneumonia. Chronic bronchitic-smoking related changes, stable. Thoracic aortic atherosclerosis. Electronically Signed   By: David  Martinique M.D.   On: 02/10/2017 11:24    EKG:   Orders placed or performed during the hospital encounter of 02/10/17  . EKG 12-Lead  . EKG 12-Lead  . ED EKG within 10 minutes  . ED EKG within 10 minutes    IMPRESSION AND PLAN:  Jenna Young  is a 83 y.o. female with a known history of GERD, anxiety, hypertension, hyperlipidemia and peripheral vascular disease is presenting to the ED with a chief complaint of burning in the chest. Patient is reporting that it started at around 7:30 AM while she was trying to fix her breakfast. Denies any shortness of breath. She felt   some squeezing sensation in the left side of her Chest. Denies any dizziness or loss of consciousness. In the ED troponin is elevated at 0.89 and EKG has revealed ST wave changes in lead 3 and aVF.   #Chest pain secondary to non-STEMI Admitted to telemetry Cycle cardiac biomarkers. Initial troponin at 0.89 EKG has revealed ST wave changes in lead 3 and aVF Will obtain echocardiogram, previous echocardiogram in 2017 has revealed 60-60% ejection fraction Aspirin, statin Continue home medication Cozaar Holding beta blocker in view of bradycardia and hypotension Nitroglycerin as needed Heparin drip is started  #Hyperlipidemia Check fasting lipid panel and patient is started on Lipitor  #Essential hypertension Blood pressure is stable Continue home medication Cozaar  #Sinus bradycardia Hold rate limiting drugs and monitor patient on telemetry   GI prophylaxis with Protonix DVT prophylaxis on heparin drip  All the records are reviewed and case discussed with ED provider. Management plans discussed with the patient,  family and they are in agreement.  CODE STATUS: fc, son is HCPOA  TOTAL  CRITICAL CARE TIME TAKING CARE OF THIS PATIENT: 45 minutes.   Note: This dictation was prepared with Dragon dictation along with smaller phrase technology. Any transcriptional errors that result from this process are unintentional.  Nicholes Mango M.D on 02/10/2017 at 1:00 PM  Between 7am to 6pm - Pager - [phone]  After 6pm go to [email or web address] - password [email or web address] [facility]  [facility]  Office  [phone]  CC: Primary care physician; Tower, [name], MD

## 2017-02-10 NOTE — Telephone Encounter (Signed)
She is in the ED now-I will continue to follow

## 2017-02-10 NOTE — ED Notes (Addendum)
Heparin gtt started prior to aptt being drawn. Dr. Margaretmary Eddy and pharmacist notified. Aptt for now d/ced.

## 2017-02-11 ENCOUNTER — Encounter
Admission: EM | Disposition: A | Discharge status: Home / Self Care | Payer: Self-pay | Source: Home / Self Care | Attending: Internal Medicine

## 2017-02-11 ENCOUNTER — Other Ambulatory Visit: Payer: Self-pay

## 2017-02-11 ENCOUNTER — Inpatient Hospital Stay (HOSPITAL_COMMUNITY)
Admit: 2017-02-11 | Discharge: 2017-02-11 | Disposition: A | Discharge status: Home / Self Care | Payer: Medicare Other | Attending: Internal Medicine | Admitting: Internal Medicine

## 2017-02-11 DIAGNOSIS — E785 Hyperlipidemia, unspecified: Secondary | ICD-10-CM

## 2017-02-11 DIAGNOSIS — I255 Ischemic cardiomyopathy: Secondary | ICD-10-CM

## 2017-02-11 DIAGNOSIS — R079 Chest pain, unspecified: Secondary | ICD-10-CM

## 2017-02-11 HISTORY — PX: LEFT HEART CATH AND CORONARY ANGIOGRAPHY: CATH118249

## 2017-02-11 HISTORY — PX: CORONARY STENT INTERVENTION: CATH118234

## 2017-02-11 LAB — POCT ACTIVATED CLOTTING TIME
Activated Clotting Time: 213 seconds
Activated Clotting Time: 263 seconds

## 2017-02-11 LAB — TSH: TSH: 1.116 u[IU]/mL (ref 0.350–4.500)

## 2017-02-11 LAB — LIPID PANEL
Cholesterol: 173 mg/dL (ref 0–200)
HDL: 53 mg/dL (ref >40)
LDL Cholesterol: 100 mg/dL — ABNORMAL HIGH (ref 0–99)
Total CHOL/HDL Ratio: 3.3 RATIO
Triglycerides: 98 mg/dL (ref <150)
VLDL: 20 mg/dL (ref 0–40)

## 2017-02-11 LAB — HEPARIN LEVEL (UNFRACTIONATED): Heparin Unfractionated: 0.43 IU/mL (ref 0.30–0.70)

## 2017-02-11 LAB — CBC
HCT: 31.5 % — ABNORMAL LOW (ref 35.0–47.0)
Hemoglobin: 10.4 g/dL — ABNORMAL LOW (ref 12.0–16.0)
MCH: 27 pg (ref 26.0–34.0)
MCHC: 33.1 g/dL (ref 32.0–36.0)
MCV: 81.6 fL (ref 80.0–100.0)
Platelets: 219 10*3/uL (ref 150–440)
RBC: 3.86 MIL/uL (ref 3.80–5.20)
RDW: 16.6 % — ABNORMAL HIGH (ref 11.5–14.5)
WBC: 7.5 10*3/uL (ref 3.6–11.0)

## 2017-02-11 LAB — TROPONIN I
Troponin I: 32.09 ng/mL (ref <0.03)
Troponin I: 24.92 ng/mL (ref <0.03)

## 2017-02-11 LAB — PROTIME-INR
INR: 1.2
Prothrombin Time: 15.1 seconds (ref 11.4–15.2)

## 2017-02-11 SURGERY — LEFT HEART CATH AND CORONARY ANGIOGRAPHY
Anesthesia: Moderate Sedation

## 2017-02-11 MED ORDER — HEPARIN (PORCINE) IN NACL 100-0.45 UNIT/ML-% IJ SOLN: 750.0000 [IU]/h | INTRAMUSCULAR | Status: DC

## 2017-02-11 MED ORDER — LABETALOL HCL 5 MG/ML IV SOLN: 10.0000 mg | INTRAVENOUS | Status: AC | PRN

## 2017-02-11 MED ORDER — GATIFLOXACIN 0.5 % OP SOLN
1.0000 [drp] | Freq: Four times a day (QID) | OPHTHALMIC | Status: DC
  Administered 2017-02-12 – 2017-02-13 (×4): 1 [drp] via OPHTHALMIC
  Filled 2017-02-11: qty 2.5

## 2017-02-11 MED ORDER — LIDOCAINE HCL (PF) 1 % IJ SOLN
INTRAMUSCULAR | Status: AC
  Filled 2017-02-11: qty 30

## 2017-02-11 MED ORDER — TIROFIBAN (AGGRASTAT) BOLUS VIA INFUSION
INTRAVENOUS | Status: DC | PRN
  Administered 2017-02-11: 1565 ug via INTRAVENOUS

## 2017-02-11 MED ORDER — HYDRALAZINE HCL 20 MG/ML IJ SOLN: 5.0000 mg | INTRAMUSCULAR | Status: AC | PRN

## 2017-02-11 MED ORDER — FENTANYL CITRATE (PF) 100 MCG/2ML IJ SOLN
INTRAMUSCULAR | Status: AC
  Filled 2017-02-11: qty 2

## 2017-02-11 MED ORDER — HEPARIN SODIUM (PORCINE) 1000 UNIT/ML IJ SOLN
INTRAMUSCULAR | Status: AC
  Filled 2017-02-11: qty 1

## 2017-02-11 MED ORDER — SODIUM CHLORIDE 0.9% FLUSH: 3.0000 mL | INTRAVENOUS | Status: DC | PRN

## 2017-02-11 MED ORDER — TIROFIBAN HCL IN NACL 5-0.9 MG/100ML-% IV SOLN
INTRAVENOUS | Status: AC | PRN
  Administered 2017-02-11: 0.075 ug/kg/min via INTRAVENOUS

## 2017-02-11 MED ORDER — SODIUM CHLORIDE 0.9 % IV SOLN: 250.0000 mL | INTRAVENOUS | Status: DC | PRN

## 2017-02-11 MED ORDER — MIDAZOLAM HCL 2 MG/2ML IJ SOLN
INTRAMUSCULAR | Status: AC
  Filled 2017-02-11: qty 2

## 2017-02-11 MED ORDER — NITROGLYCERIN 5 MG/ML IV SOLN
INTRAVENOUS | Status: AC
  Filled 2017-02-11: qty 10

## 2017-02-11 MED ORDER — HEPARIN (PORCINE) IN NACL 2-0.9 UNIT/ML-% IJ SOLN
INTRAMUSCULAR | Status: AC
  Filled 2017-02-11: qty 1000

## 2017-02-11 MED ORDER — CLOPIDOGREL BISULFATE 75 MG PO TABS
ORAL_TABLET | ORAL | Status: AC
  Filled 2017-02-11: qty 4

## 2017-02-11 MED ORDER — SODIUM CHLORIDE 0.9% FLUSH: 3.0000 mL | Freq: Two times a day (BID) | INTRAVENOUS | Status: DC

## 2017-02-11 MED ORDER — IOPAMIDOL (ISOVUE-300) INJECTION 61%
INTRAVENOUS | Status: DC | PRN
  Administered 2017-02-11: 105 mL via INTRA_ARTERIAL

## 2017-02-11 MED ORDER — HEPARIN (PORCINE) IN NACL 100-0.45 UNIT/ML-% IJ SOLN
800.0000 [IU]/h | INTRAMUSCULAR | Status: DC
  Administered 2017-02-12: 750 [IU]/h via INTRAVENOUS
  Filled 2017-02-11: qty 250

## 2017-02-11 MED ORDER — SODIUM CHLORIDE 0.9 % IV SOLN: INTRAVENOUS | Status: AC

## 2017-02-11 MED ORDER — MIDAZOLAM HCL 2 MG/2ML IJ SOLN
INTRAMUSCULAR | Status: DC | PRN
  Administered 2017-02-11: 0.5 mg via INTRAVENOUS

## 2017-02-11 MED ORDER — VERAPAMIL HCL 2.5 MG/ML IV SOLN
INTRAVENOUS | Status: AC
  Filled 2017-02-11: qty 2

## 2017-02-11 MED ORDER — SODIUM CHLORIDE 0.9 % WEIGHT BASED INFUSION
1.0000 mL/kg/h | INTRAVENOUS | Status: DC
  Administered 2017-02-11: 1 mL/kg/h via INTRAVENOUS

## 2017-02-11 MED ORDER — FENTANYL CITRATE (PF) 100 MCG/2ML IJ SOLN
INTRAMUSCULAR | Status: DC | PRN
  Administered 2017-02-11: 25 ug via INTRAVENOUS

## 2017-02-11 MED ORDER — NITROGLYCERIN 1 MG/10 ML FOR IR/CATH LAB
INTRA_ARTERIAL | Status: DC | PRN
  Administered 2017-02-11: 200 ug

## 2017-02-11 MED ORDER — SODIUM CHLORIDE 0.9% FLUSH
3.0000 mL | Freq: Two times a day (BID) | INTRAVENOUS | Status: DC
  Administered 2017-02-11 – 2017-02-13 (×3): 3 mL via INTRAVENOUS

## 2017-02-11 MED ORDER — CLOPIDOGREL BISULFATE 75 MG PO TABS
75.0000 mg | ORAL_TABLET | Freq: Every day | ORAL | Status: DC
  Administered 2017-02-12 – 2017-02-13 (×2): 75 mg via ORAL
  Filled 2017-02-11 (×2): qty 1

## 2017-02-11 MED ORDER — SODIUM CHLORIDE 0.9 % IV SOLN: INTRAVENOUS | Status: DC

## 2017-02-11 MED ORDER — ASPIRIN EC 81 MG PO TBEC
81.0000 mg | DELAYED_RELEASE_TABLET | Freq: Every day | ORAL | Status: DC
  Administered 2017-02-12 – 2017-02-13 (×2): 81 mg via ORAL
  Filled 2017-02-11 (×2): qty 1

## 2017-02-11 MED ORDER — TIROFIBAN HCL IN NACL 5-0.9 MG/100ML-% IV SOLN
INTRAVENOUS | Status: AC
  Filled 2017-02-11: qty 100

## 2017-02-11 MED ORDER — HEPARIN SODIUM (PORCINE) 1000 UNIT/ML IJ SOLN
INTRAMUSCULAR | Status: DC | PRN
  Administered 2017-02-11: 3500 [IU] via INTRAVENOUS
  Administered 2017-02-11: 1000 [IU] via INTRAVENOUS

## 2017-02-11 MED ORDER — ASPIRIN 81 MG PO CHEW: 81.0000 mg | CHEWABLE_TABLET | ORAL | Status: DC

## 2017-02-11 SURGICAL SUPPLY — 16 items
BALLN MINITREK RX 1.5X12 (BALLOONS) ×2
BALLN TREK RX 2.25X15 (BALLOONS) ×2
BALLOON MINITREK RX 1.5X12 (BALLOONS) ×1 IMPLANT
BALLOON TREK RX 2.25X15 (BALLOONS) ×1 IMPLANT
CATH INFINITI 5 FR JL3.5 (CATHETERS) ×2 IMPLANT
CATH INFINITI JR4 5F (CATHETERS) ×2 IMPLANT
CATH LAUNCHER 6FR JR4 (CATHETERS) ×2 IMPLANT
DEVICE INFLAT 30 PLUS (MISCELLANEOUS) ×2 IMPLANT
DEVICE RAD TR BAND REGULAR (VASCULAR PRODUCTS) ×2 IMPLANT
GLIDESHEATH SLEND SS 6F .021 (SHEATH) ×2 IMPLANT
KIT MANI 3VAL PERCEP (MISCELLANEOUS) ×2 IMPLANT
PACK CARDIAC CATH (CUSTOM PROCEDURE TRAY) ×2 IMPLANT
WIRE ASAHI GRAND SLAM 180CM (WIRE) ×2 IMPLANT
WIRE HITORQ VERSACORE ST 145CM (WIRE) ×2 IMPLANT
WIRE ROSEN-J .035X260CM (WIRE) ×2 IMPLANT
WIRE RUNTHROUGH .014X180CM (WIRE) ×2 IMPLANT

## 2017-02-11 NOTE — Progress Notes (Signed)
Progress Note  Patient Name: Jenna Young Date of Encounter: 02/11/2017  Primary Cardiologist: Esmond Plants, MD PhD  Subjective   No further chest pain. Still intermittently nauseated overnight and this morning. Had a "bad reaction" to Phenergan this morning. Catheterization showed high-grade lesions involving RCA and OM1 (small vessel). PCI to RCA was unsuccessful due to heavy calcification.  Inpatient Medications    Scheduled Meds: . [MAR Hold] atorvastatin  40 mg Oral q1800  . [MAR Hold] calcium-vitamin D  2 tablet Oral Daily  . [MAR Hold] cholecalciferol  2,000 Units Oral Daily  . [MAR Hold] cyanocobalamin  1,000 mcg Intramuscular Q90 days  . [MAR Hold] Difluprednate  1 drop Right Eye TID  . [MAR Hold] gatifloxacin  1 drop Both Eyes QID  . [MAR Hold] isosorbide mononitrate  30 mg Oral Daily  . [MAR Hold] ketorolac  1 drop Right Eye QID  . [MAR Hold] losartan  100 mg Oral Daily  . [MAR Hold] metoprolol tartrate  12.5 mg Oral BID  . [MAR Hold] pantoprazole  40 mg Oral Daily  . sodium chloride flush  3 mL Intravenous Q12H   Continuous Infusions: . sodium chloride 50 mL/hr at 02/11/17 1416  . sodium chloride    . heparin     PRN Meds: sodium chloride, [MAR Hold] acetaminophen, [MAR Hold] aspirin, [MAR Hold] nitroGLYCERIN, [MAR Hold] ondansetron (ZOFRAN) IV, [MAR Hold] promethazine, sodium chloride flush   Vital Signs    Vitals:   02/11/17 1545 02/11/17 1600 02/11/17 1615 02/11/17 1630  BP:  (!) 114/40  (!) 106/43  Pulse: 62 68 68 65  Resp: 16 15 20  (!) 21  Temp:      TempSrc:      SpO2: 95% 95% 97% 97%  Weight:      Height:        Intake/Output Summary (Last 24 hours) at 02/11/2017 1650 Last data filed at 02/11/2017 1558 Gross per 24 hour  Intake 82.5 ml  Output 300 ml  Net -217.5 ml   Filed Weights   02/11/17 0733  Weight: 138 lb (62.6 kg)    Telemetry    NSR - Personally Reviewed  ECG    NSR with LVH and QRS widening and inferolateral T-wave  inversions - Personally Reviewed  Physical Exam   GEN: No acute distress.   Neck: No JVD Cardiac: RRR, no murmurs, rubs, or gallops.  Respiratory: Clear to auscultation bilaterally. GI: Soft, nontender, non-distended  MS: No edema; No deformity. TR band in place on right wrist. Neuro:  Nonfocal  Psych: Normal affect   Labs    Chemistry Recent Labs  Lab 02/10/17 1132  NA 137  K 4.0  CL 103  CO2 23  GLUCOSE 137*  BUN 11  CREATININE 0.91  CALCIUM 9.5  GFRNONAA 57*  GFRAA >60  ANIONGAP 11     Hematology Recent Labs  Lab 02/10/17 1132 02/11/17 0509  WBC 7.0 7.5  RBC 4.23 3.86  HGB 11.6* 10.4*  HCT 34.4* 31.5*  MCV 81.2 81.6  MCH 27.5 27.0  MCHC 33.9 33.1  RDW 16.6* 16.6*  PLT 220 219    Cardiac Enzymes Recent Labs  Lab 02/10/17 1132 02/10/17 1558 02/10/17 2028 02/11/17 0509  TROPONINI 0.89* 8.83* 20.95* 32.09*   No results for input(s): TROPIPOC in the last 168 hours.   BNPNo results for input(s): BNP, PROBNP in the last 168 hours.   DDimer No results for input(s): DDIMER in the last 168 hours.  Radiology    Dg Chest 2 View  Result Date: 02/10/2017 CLINICAL DATA:  Onset of chest pain around 7:30 this morning and has persisted associated with nausea with some diaphoresis. EXAM: CHEST  2 VIEW COMPARISON:  Portable chest x-ray of January 14, 2016 FINDINGS: The lungs are adequately inflated. The interstitial markings remain mildly increased. The cardiac silhouette is enlarged and more conspicuous today. The pulmonary vascularity is normal. There is calcification in the wall of the thoracic aorta. The bony thorax exhibits no acute abnormality. IMPRESSION: Mild cardiomegaly without pulmonary vascular congestion. No alveolar pneumonia. Chronic bronchitic-smoking related changes, stable. Thoracic aortic atherosclerosis. Electronically Signed   By: David  Martinique M.D.   On: 02/10/2017 11:24    Cardiac Studies   LHC (02/11/17): 1. Significant mid RCA disease  with sequential, calcified 80 and 95% stenoses. Element of thrombus is likely also present. 2. 90% ostial stenosis involving small OM1 branch. 3. Mildly reduced left ventricular contraction with inferior hypokinesis (LVEF ~45%). 4. Mildly elevated left ventricular filling pressure. 5. Unsuccessful PCI to mid RCA due to inability to cross the mid RCA stenosis despite aggressive buddy wire support.  Patient Profile     83 y.o. 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  Assessment & Plan    NSTEMI Symptoms have resolved but troponin still trending up as of 5 AM (32.1). Catheterization today showed dominant RCA with sequential 80% and 95% mid RCA stenosis that could not be crossed with a balloon due to calcification. Given resolution of symptoms and TIMI-3 flow, intervention was aborted without angioplasty or stent placement.  Dual antiplatelet therapy with ASA and clopidogrel for 12 months.  Restart heparin infusion 2 hours after TR band has been removed, to complete 48 hours of heparin from time of admission.  If blood pressure allows, consider adding isosorbide mononitrate tomorrow morning for anginal therapy.  Continue atorvastatin and metoprolol tartrate.  Follow-up echo.  Obtain additional troponin to establish peak.  Close outpatient follow-up after discharge. Consider PCI with atherectomy to mid RCA in ~4 weeks, particularly if patient has symptoms despite optimal medical therapy.  Ischemic cardiomyopathy LVEF noted to be mildly reduced with inferior hypokinesis by LV gram; echo pending.  Continue metoprolol.  Hold losartan, given hypotension during this admission and to allow for initiation of isosorbide mononitrate, if possible.  Hypertension BP has been labile, most recently on the low side following catheterization.  Hold losartan for now and consider restarting at 25-50 mg daily, as blood  pressure allows.  Hyperlipidemia Goal LDL <70.  Continue atorvastatin 40 mg daily.  For questions or updates, please contact [place] Please consult [email or web address] for contact info under [facility] Cardiology      Signed, Nelva Bush, MD  02/11/2017, 4:50 PM

## 2017-02-11 NOTE — Progress Notes (Signed)
ANTICOAGULATION CONSULT NOTE - Initial Consult  Pharmacy Consult for heparin drip Indication: chest pain/ACS  Allergies  Allergen Reactions  . Buspirone Hcl     REACTION: HEART PALPITATIONS  . Codeine     REACTION: nausea and vomiting  . Hydrochlorothiazide     REACTION: syncope-possibly from dehydration  . Lansoprazole     REACTION: abd pain  . Minocycline Hcl   . Penicillins     REACTION: mouth numbness Has patient had a PCN reaction causing immediate rash, facial/tongue/throat swelling, SOB or lightheadedness with hypotension: No Has patient had a PCN reaction causing severe rash involving mucus membranes or skin necrosis: No Has patient had a PCN reaction that required hospitalization: No Has patient had a PCN reaction occurring within the last 10 years: No If all of the above answers are "NO", then may proceed with Cephalosporin use.  . Ramipril     Cough   . Sulfa Antibiotics Nausea Only    Patient Measurements:   Heparin Dosing Weight: 62 kg HDW = 62 kg. Calculated using RN reported patient weight of 67.1 kg.  Vital Signs: Temp: 98.9 F (37.2 C) (12/04 0317) Temp Source: Oral (12/04 0317) BP: 98/43 (12/04 0317) Pulse Rate: 57 (12/04 0317)  Labs: Recent Labs    02/10/17 1132 02/10/17 1558 02/10/17 2028 02/11/17 0509  HGB 11.6*  --   --  10.4*  HCT 34.4*  --   --  31.5*  PLT 220  --   --  219  LABPROT  --   --   --  15.1  INR  --   --   --  1.20  HEPARINUNFRC  --   --  0.65 0.43  CREATININE 0.91  --   --   --   TROPONINI 0.89* 8.83* 20.95*  --     CrCl cannot be calculated (Unknown ideal weight.).  Assessment: Pharmacy consulted to dose and monitor heparin in this 83 year old female for ACS. Baseline labs have been ordered. Patient was not taking any anticoagulants PTA per med rec.   Goal of Therapy:  Heparin level 0.3-0.7 units/ml Monitor platelets by anticoagulation protocol: Yes   Plan:  Give 3700 units bolus x 1 Start heparin infusion at  750 units/hr Check anti-Xa level in 8 hours and daily while on heparin Continue to monitor H&H and platelets   12/3:  HL @ 20:30 = 0.65 Will continue this pt on current rate and recheck HL in 8 hrs on 12/4 @ 0430.   12/4 @ 0500 HL 0.43 therapeutic. Will continue current rate and will recheck HL/CBC w/ am labs.  Tobie Lords, PharmD, BCPS Clinical Pharmacist 02/11/2017,6:55 AM

## 2017-02-11 NOTE — Progress Notes (Signed)
ANTICOAGULATION CONSULT NOTE - Initial Consult  Pharmacy Consult for heparin drip Indication: chest pain/ACS  Allergies  Allergen Reactions  . Buspirone Hcl     REACTION: HEART PALPITATIONS  . Codeine     REACTION: nausea and vomiting  . Hydrochlorothiazide     REACTION: syncope-possibly from dehydration  . Lansoprazole     REACTION: abd pain  . Minocycline Hcl   . Penicillins     REACTION: mouth numbness Has patient had a PCN reaction causing immediate rash, facial/tongue/throat swelling, SOB or lightheadedness with hypotension: No Has patient had a PCN reaction causing severe rash involving mucus membranes or skin necrosis: No Has patient had a PCN reaction that required hospitalization: No Has patient had a PCN reaction occurring within the last 10 years: No If all of the above answers are "NO", then may proceed with Cephalosporin use.  . Ramipril     Cough   . Sulfa Antibiotics Nausea Only    Patient Measurements: Height: 5\' 1"  (154.9 cm) Weight: 138 lb (62.6 kg) IBW/kg (Calculated) : 47.8 Heparin Dosing Weight: 62 kg HDW = 62 kg. Calculated using RN reported patient weight of 67.1 kg.  Vital Signs: Temp: 98.3 F (36.8 C) (12/04 0733) Temp Source: Oral (12/04 0733) BP: 127/49 (12/04 1400) Pulse Rate: 61 (12/04 1400)  Labs: Recent Labs    02/10/17 1132 02/10/17 1558 02/10/17 2028 02/11/17 0509  HGB 11.6*  --   --  10.4*  HCT 34.4*  --   --  31.5*  PLT 220  --   --  219  LABPROT  --   --   --  15.1  INR  --   --   --  1.20  HEPARINUNFRC  --   --  0.65 0.43  CREATININE 0.91  --   --   --   TROPONINI 0.89* 8.83* 20.95* 32.09*    Estimated Creatinine Clearance: 39.7 mL/min (by C-G formula based on SCr of 0.91 mg/dL).  Assessment: Pharmacy consulted to dose and monitor heparin in this 83 year old female for ACS. Baseline labs have been ordered. Patient was not taking any anticoagulants PTA per med rec.   Goal of Therapy:  Heparin level 0.3-0.7  units/ml Monitor platelets by anticoagulation protocol: Yes   Plan:  Give 3700 units bolus x 1 Start heparin infusion at 750 units/hr Check anti-Xa level in 8 hours and daily while on heparin Continue to monitor H&H and platelets   12/3:  HL @ 20:30 = 0.65 Will continue this pt on current rate and recheck HL in 8 hrs on 12/4 @ 0430.   12/4 @ 0500 HL 0.43 therapeutic. Will continue current rate and will recheck HL/CBC w/ am labs.  12/4@ 1430 Patient had left heart cath and coronary angiography, Dr End would like to resume previous heparin infusion rate 2 hr post TR band removal. Will restart heparin at 750 units/hr at 1800, due to RN stating TR band is likely to be removed at 4pm. Included in the admin instructions to wait 2 hours, also will be handing off to RN Tasha. Will recheck HL with AM labs due to already therapeutic at that rate twice.   Donna Christen Makalah Asberry, PharmD, BCPS Clinical Pharmacist 02/11/2017,2:14 PM

## 2017-02-11 NOTE — Progress Notes (Signed)
[place] [place] NAME: Jenna Young    MR#:  [id number]  DATE OF BIRTH:  06-21-1931  SUBJECTIVE:  CHIEF COMPLAINT:   Chief Complaint  Patient presents with  . Chest Pain  . Bradycardia   The patient is status post cardiac cath this am.  She has no complaints. REVIEW OF SYSTEMS:  Review of Systems  Constitutional: Positive for malaise/fatigue. Negative for chills and fever.  HENT: Negative for sore throat.   Eyes: Negative for blurred vision and double vision.  Respiratory: Negative for cough, hemoptysis, shortness of breath, wheezing and stridor.   Cardiovascular: Negative for chest pain, palpitations, orthopnea and leg swelling.  Gastrointestinal: Negative for abdominal pain, blood in stool, diarrhea, melena, nausea and vomiting.  Genitourinary: Negative for dysuria, flank pain and hematuria.  Musculoskeletal: Negative for back pain and joint pain.  Neurological: Positive for weakness. Negative for dizziness, sensory change, focal weakness, seizures, loss of consciousness and headaches.  Endo/Heme/Allergies: Negative for polydipsia.  Psychiatric/Behavioral: Negative for depression. The patient is not nervous/anxious.     DRUG ALLERGIES:   Allergies  Allergen Reactions  . Buspirone Hcl     REACTION: HEART PALPITATIONS  . Codeine     REACTION: nausea and vomiting  . Hydrochlorothiazide     REACTION: syncope-possibly from dehydration  . Lansoprazole     REACTION: abd pain  . Minocycline Hcl   . Penicillins     REACTION: mouth numbness Has patient had a PCN reaction causing immediate rash, facial/tongue/throat swelling, SOB or lightheadedness with hypotension: No Has patient had a PCN reaction causing severe rash involving mucus membranes or skin necrosis: No Has patient had a PCN reaction that required hospitalization: No Has patient had a PCN reaction occurring within the last 10 years: No If all of the above answers  are "NO", then may proceed with Cephalosporin use.  . Ramipril     Cough   . Sulfa Antibiotics Nausea Only   VITALS:  Blood pressure (!) 114/40, pulse 68, temperature 98.3 F (36.8 C), temperature source Oral, resp. rate 15, height 5\' 1"  (1.549 m), weight 138 lb (62.6 kg), SpO2 95 %. PHYSICAL EXAMINATION:  Physical Exam  Constitutional: She is oriented to person, place, and time and well-developed, well-nourished, and in no distress.  HENT:  Head: Normocephalic.  Mouth/Throat: Oropharynx is clear and moist.  Eyes: Conjunctivae and EOM are normal. Pupils are equal, round, and reactive to light. No scleral icterus.  Neck: Normal range of motion. Neck supple. No JVD present. No tracheal deviation present.  Cardiovascular: Normal rate, regular rhythm and normal heart sounds. Exam reveals no gallop.  No murmur heard. Pulmonary/Chest: Effort normal and breath sounds normal. No respiratory distress. She has no wheezes. She has no rales.  Abdominal: Soft. Bowel sounds are normal. She exhibits no distension. There is no tenderness. There is no rebound.  Musculoskeletal: Normal range of motion. She exhibits no edema or tenderness.  Neurological: She is alert and oriented to person, place, and time. No cranial nerve deficit.  Skin: No rash noted. No erythema.  Psychiatric: Affect normal.   LABORATORY PANEL:  Female CBC Recent Labs  Lab 02/11/17 0509  WBC 7.5  HGB 10.4*  HCT 31.5*  PLT 219   ------------------------------------------------------------------------------------------------------------------ Chemistries  Recent Labs  Lab 02/10/17 1132  NA 137  K 4.0  CL 103  CO2 23  GLUCOSE 137*  BUN 11  CREATININE 0.91  CALCIUM 9.5   RADIOLOGY:  No results found. ASSESSMENT AND PLAN:  Jenna Young  is a 83 y.o. female with a known history of GERD, anxiety, hypertension, hyperlipidemia and peripheral vascular disease is presenting to the ED with a chief complaint of burning in the  chest. Patient is reporting that it started at around 7:30 AM while she was trying to fix her breakfast. Denies any shortness of breath. She felt   some squeezing sensation in the left side of her Chest. Denies any dizziness or loss of consciousness. In the ED troponin is elevated at 0.89 and EKG has revealed ST wave changes in lead 3 and aVF.   # Non-STEMI She is treated with heparin drip, ASA and statin. Cardiac cath done by Dr. Saunders Revel: 1. Significant mid RCA disease with sequential, calcified 80 and 95% stenoses. Element of thrombus is likely also present. 2. 90% ostial stenosis involving small OM1 branch. 3. Mildly reduced left ventricular contraction with inferior hypokinesis (LVEF ~45%). 4. Mildly elevated left ventricular filling pressure. 5. Unsuccessful PCI to mid RCA due to inability to cross the mid RCA stenosis despite aggressive buddy wire support. Per Dr. Saunders Revel, 1. Restart heparin 2 hours after TR band removal to complete 48 hours of heparin from time of admission. 2. Aspirin and clopidogrel for at least 12 months. 3. Optimize antianginal therapy. If patient has continued symptoms, PCI with atherectomy of the mid RCA could be considered in 4 weeks.  #Hyperlipidemia Continue Lipitor  #Essential hypertension Blood pressure is stable Continue home medication Cozaar  #Sinus bradycardia Hold rate limiting drugs and monitor patient on telemetry  All the records are reviewed and case discussed with Care Management/Social Worker. Management plans discussed with the patient, family and they are in agreement.  CODE STATUS: Full Code  TOTAL TIME TAKING CARE OF THIS PATIENT: 36 minutes.   More than 50% of the time was spent in counseling/coordination of care: YES  POSSIBLE D/C IN 3 DAYS, DEPENDING ON CLINICAL CONDITION.   Demetrios Loll M.D on 02/11/2017 at 4:19 PM  Between 7am to 6pm - Pager - [phone]  After 6pm go to [email or web address] - Research scientist (physical sciences) Hospitalists

## 2017-02-11 NOTE — Progress Notes (Signed)
Talked to [place], Therapist, sports in specials, per RN TR band to be remove at 1645, talk to pharmacy, per MD to restart heparin drip 2 hours prior to TR band removal, will restart Heparin drip at 1845. RN will continue to monitor.

## 2017-02-11 NOTE — Interval H&P Note (Signed)
History and Physical Interval Note:  02/11/2017 7:33 AM  Jenna Young  has presented today for cardiac catheterization, with the diagnosis of NSTEMI  The various methods of treatment have been discussed with the patient and family. After consideration of risks, benefits and other options for treatment, the patient has consented to  Procedure(s): LEFT HEART CATH AND CORONARY ANGIOGRAPHY (N/A) as a surgical intervention .  The patient's history has been reviewed, patient examined, no change in status, stable for surgery.  I have reviewed the patient's chart and labs.  Questions were answered to the patient's satisfaction.    Cath Lab Visit (complete for each Cath Lab visit)  Clinical Evaluation Leading to the Procedure:   ACS: Yes.    Non-ACS:  N/A  Jahvier Aldea

## 2017-02-11 NOTE — Progress Notes (Signed)
Patient BP trending downward although remaining WNL, patient reports no complaints at this time. Dr End notified with no new orders. Will continue to monitor.

## 2017-02-12 ENCOUNTER — Encounter: Payer: Self-pay | Admitting: *Deleted

## 2017-02-12 LAB — CBC
HCT: 30.3 % — ABNORMAL LOW (ref 35.0–47.0)
Hemoglobin: 10.1 g/dL — ABNORMAL LOW (ref 12.0–16.0)
MCH: 27.2 pg (ref 26.0–34.0)
MCHC: 33.4 g/dL (ref 32.0–36.0)
MCV: 81.2 fL (ref 80.0–100.0)
Platelets: 179 10*3/uL (ref 150–440)
RBC: 3.73 MIL/uL — ABNORMAL LOW (ref 3.80–5.20)
RDW: 16.4 % — ABNORMAL HIGH (ref 11.5–14.5)
WBC: 6.5 10*3/uL (ref 3.6–11.0)

## 2017-02-12 LAB — BASIC METABOLIC PANEL
Anion gap: 5 (ref 5–15)
BUN: 13 mg/dL (ref 6–20)
CO2: 22 mmol/L (ref 22–32)
Calcium: 8.5 mg/dL — ABNORMAL LOW (ref 8.9–10.3)
Chloride: 107 mmol/L (ref 101–111)
Creatinine, Ser: 0.89 mg/dL (ref 0.44–1.00)
GFR calc Af Amer: >60 mL/min (ref >60)
GFR calc non Af Amer: 58 mL/min — ABNORMAL LOW (ref >60)
Glucose, Bld: 91 mg/dL (ref 65–99)
Potassium: 3.9 mmol/L (ref 3.5–5.1)
Sodium: 134 mmol/L — ABNORMAL LOW (ref 135–145)

## 2017-02-12 LAB — ECHOCARDIOGRAM COMPLETE
Height: 61 in
Weight: 2208 oz

## 2017-02-12 LAB — HEPARIN LEVEL (UNFRACTIONATED)
Heparin Unfractionated: 0.31 IU/mL (ref 0.30–0.70)
Heparin Unfractionated: 0.38 IU/mL (ref 0.30–0.70)

## 2017-02-12 MED ORDER — ISOSORBIDE MONONITRATE ER 30 MG PO TB24
30.0000 mg | ORAL_TABLET | Freq: Every day | ORAL | Status: DC
  Administered 2017-02-13: 30 mg via ORAL
  Filled 2017-02-12: qty 1

## 2017-02-12 NOTE — Progress Notes (Signed)
ANTICOAGULATION CONSULT NOTE - Initial Consult  Pharmacy Consult for heparin drip Indication: chest pain/ACS  Allergies  Allergen Reactions  . Buspirone Hcl     REACTION: HEART PALPITATIONS  . Codeine     REACTION: nausea and vomiting  . Hydrochlorothiazide     REACTION: syncope-possibly from dehydration  . Lansoprazole     REACTION: abd pain  . Minocycline Hcl   . Penicillins     REACTION: mouth numbness Has patient had a PCN reaction causing immediate rash, facial/tongue/throat swelling, SOB or lightheadedness with hypotension: No Has patient had a PCN reaction causing severe rash involving mucus membranes or skin necrosis: No Has patient had a PCN reaction that required hospitalization: No Has patient had a PCN reaction occurring within the last 10 years: No If all of the above answers are "NO", then may proceed with Cephalosporin use.  . Ramipril     Cough   . Sulfa Antibiotics Nausea Only    Patient Measurements: Height: 5\' 1"  (154.9 cm) Weight: 138 lb (62.6 kg) IBW/kg (Calculated) : 47.8 Heparin Dosing Weight: 62 kg HDW = 62 kg. Calculated using RN reported patient weight of 67.1 kg.  Vital Signs: Temp: 98.4 F (36.9 C) (12/05 0731) Temp Source: Oral (12/05 0731) BP: 112/56 (12/05 0731) Pulse Rate: 54 (12/05 0731)  Labs: Recent Labs    02/10/17 1132  02/10/17 2028 02/11/17 0509 02/11/17 1810 02/12/17 0508 02/12/17 1302  HGB 11.6*  --   --  10.4*  --  10.1*  --   HCT 34.4*  --   --  31.5*  --  30.3*  --   PLT 220  --   --  219  --  179  --   LABPROT  --   --   --  15.1  --   --   --   INR  --   --   --  1.20  --   --   --   HEPARINUNFRC  --    < > 0.65 0.43  --  0.31 0.38  CREATININE 0.91  --   --   --   --  0.89  --   TROPONINI 0.89*   < > 20.95* 32.09* 24.92*  --   --    < > = values in this interval not displayed.    Estimated Creatinine Clearance: 40.6 mL/min (by C-G formula based on SCr of 0.89 mg/dL).  Assessment: Pharmacy consulted to dose  and monitor heparin in this 83 year old female for ACS. Baseline labs have been ordered. Patient was not taking any anticoagulants PTA per med rec.   Goal of Therapy:  Heparin level 0.3-0.7 units/ml Monitor platelets by anticoagulation protocol: Yes   Plan:  Heparin level therapeutic x 2 since restarting heparin. Continue heparin drip at 800 units/hr and recheck with AM labs  Ramond Dial, PharmD, BCPS Clinical Pharmacist 02/12/2017,1:52 PM

## 2017-02-12 NOTE — Evaluation (Signed)
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  Assessment includes: NSTEMI with down trending troponin, ICM, HTN, and HLD.    Clinical Impression  Pt Ind with bed mob, transfers, gait, and stairs with one rail.  Pt's SpO2 95% on room air and HR 70 bpm and rest and 96% and 80 bpm after amb with no adverse symptoms noted.  Pt able to amb 125' and ascend 3 steps with one rail independently with no increased effort or instability noted.  Pt educated on precautions with recent RUE radial artery compression device as well as exercises to perform while in hospital to minimize deconditioning.  Pt reports will be participating in cardiac rehab upon discharge from acute care and has no further skilled PT services needs at this time.  Will reassess pt pending a change in status upon receipt of new PT orders.     Follow Up Recommendations No PT follow up    Equipment Recommendations  None recommended by PT    Recommendations for Other Services       Precautions / Restrictions Precautions Precautions: Fall Precaution Comments: Recent cardiac cath with RUE radial artery comp device  Restrictions Weight Bearing Restrictions: Yes RUE Weight Bearing: Non weight bearing Other Position/Activity Restrictions: Radial artery compression device precautions      Mobility  Bed Mobility Overal bed mobility: Independent             General bed mobility comments: Min verbal cues to avoid use of RUE during bed mob tasks  Transfers Overall transfer level: Independent               General transfer comment: Steady without LOB with good speed and effort with min verbal cues to avoid WB through  RUE during transfers  Ambulation/Gait Ambulation/Gait assistance: Independent Ambulation Distance (Feet): 125 Feet Assistive device: None Gait Pattern/deviations: Step-through pattern;Decreased step length - right;Decreased step length - left   Gait velocity interpretation: Below normal speed for age/gender General Gait Details: Good stability during amb with slow cadence  Stairs Stairs: Yes Stairs assistance: Independent Stair Management: One rail Left Number of Stairs: 3 General stair comments: Good control and stability during assending and descending stairs  Wheelchair Mobility    Modified Rankin (Stroke Patients Only)       Balance Overall balance assessment: Independent                                           Pertinent Vitals/Pain Pain Assessment: No/denies pain    Home Living Family/patient expects to be discharged to:: Private residence Living Arrangements: Other relatives;Other (Comment)(Pt lives with grandson) Available Help at Discharge: Family;Available PRN/intermittently Type of Home: House Home Access: Stairs to enter Entrance Stairs-Rails: Psychiatric nurse of Steps: 3 Home Layout: One level Home Equipment: Cane - single point      Prior Function Level of Independence: Independent         Comments: Ind with amb community distances without AD, no fall history, Ind with ADLs     Hand Dominance   Dominant Hand: Right    Extremity/Trunk Assessment   Upper Extremity Assessment  Upper Extremity Assessment: Overall WFL for tasks assessed    Lower Extremity Assessment Lower Extremity Assessment: Overall WFL for tasks assessed       Communication   Communication: No difficulties  Cognition Arousal/Alertness: Awake/alert Behavior During Therapy: WFL for tasks assessed/performed Overall Cognitive Status: Within Functional Limits for tasks assessed                                         General Comments General comments (skin integrity, edema, etc.): Pt steady during static and dynamic standing balance assessment without UE support    Exercises Total Joint Exercises Ankle Circles/Pumps: AROM;Both;10 reps Quad Sets: Strengthening;Both;10 reps Gluteal Sets: Strengthening;Both;10 reps Hip ABduction/ADduction: AROM;Both;5 reps Straight Leg Raises: AROM;Both;5 reps Long Arc Quad: AROM;Both;10 reps Knee Flexion: AROM;Both;10 reps Other Exercises Other Exercises: HEP education for BLE APs, GS, QS, and LAQs x 10 5-6x/day while in acute care   Assessment/Plan    PT Assessment [place] does not need any further PT services  PT Problem List         PT Treatment Interventions      PT Goals (Current goals can be found in the Care Plan section)  Acute Rehab PT Goals PT Goal Formulation: All assessment and education complete, DC therapy    Frequency     Barriers to discharge        Co-evaluation               AM-PAC PT "6 Clicks" Daily Activity  Outcome Measure Difficulty turning over in bed (including adjusting bedclothes, sheets and blankets)?: None Difficulty moving from lying on back to sitting on the side of the bed? : None Difficulty sitting down on and standing up from a chair with arms (e.g., wheelchair, bedside commode, etc,.)?: None Help needed moving to and from a bed to chair (including a wheelchair)?: None Help needed walking in hospital room?: None Help needed climbing 3-5 steps with a railing? : None 6 Click Score: 24    End of Session Equipment Utilized During Treatment: Gait belt Activity Tolerance: Patient tolerated treatment well Patient left: Other (comment)(Pt left in a friend's hospital room down the hall to visit in w/c with pt's son, pt's nurse notified) Nurse Communication: Mobility status PT Visit Diagnosis: Muscle weakness (generalized) (M62.81)    Time: 9528-4132 PT Time Calculation (min) (ACUTE ONLY): 30 min   Charges:    PT Evaluation $PT Eval Low Complexity: 1 Low PT Treatments $Therapeutic Exercise: 8-22 mins   PT G Codes:   PT G-Codes **NOT FOR INPATIENT CLASS** Functional Assessment Tool Used: AM-PAC 6 Clicks Basic Mobility Functional Limitation: Mobility: Walking and moving around Mobility: Walking and Moving Around Current Status (G4010): 0 percent impaired, limited or restricted Mobility: Walking and Moving Around Goal Status (U7253): 0 percent impaired, limited or restricted Mobility: Walking and Moving Around Discharge Status (G6440): 0 percent impaired, limited or restricted    D. Scott Neko Mcgeehan PT, DPT 02/12/17, 3:11 PM

## 2017-02-12 NOTE — Progress Notes (Addendum)
Patient referred to Cardiac Rehab with dx of NSTEMI.  Cardiac Cath performed yesterday showed high-grade lesions involving RCA and OM1 (small vessel).  PCI to RCA was unsuccessful due to heavy calcification.  Plan per Dr. Saunders Revel:  Patient will be on dual antiplatelet therapy with ASA and Plavix for 12 months.  Heparin infusion for a total of 48 hours from the time of admission.  Start Imdur if BP allows.  Continue Atorvastatin and metoprolol.  Echo pending.  May need PCI with Arthrectomy to mid RCA in approximately 4 weeks at [facility] if patient has symptoms despite optimal medical therapy.    Patient has hx of GERD, Anxiety, HTN, HLD, and PVD, DCIS - S/P lumpectomy with radiation.  Patient's presenting symptoms were burning in chest, nausea, and diaphoresis.  Patient is a former smoker - quit 1970.    "Heart Attack Bouncing Back" booklet given and reviewed with patient and daughter. ?Discussed modifiable risk factors including controlling blood pressure, cholesterol, and blood sugar; following heart healthy diet; maintaining healthy weight; exercise; and smoking cessation if applicable. ?Note: Patient is a ?former smoker - quit in 1970.    *Discussed cardiac medications including rationale for taking, mechanisms of action, and side effects. ?  *Discussed emergency plan for heart attack symptoms. Discussed when to use NTG. Patient verbalized understanding of need to call 911 and not to drive herself to ER if having cardiac symptoms / chest pain.  ? *Heart healthy diet of low sodium, low fat, low cholesterol heart healthy diet discussed.  Information on diet provided. ?Dietitian Consultation ordered for more in-depth education on heart healthy diet.    *Exercise - Benefits of exercise discussed. Patient has been exercising with her friends at [employer] two to three times a week for three years.  Explained to patient and daughter that patient has been referred to Cardiac Rehab. ?An overview of the program  was provided. ?Benefits of participating in the program were discussed.  Patient and daughter informed this RN that Dr. Rockey Situ had instructed patient to participate in Cardiac Rehab and put [facility] [employer] on hold for now.   Awaiting for call back from Dr. Rockey Situ to see how soon patient could start Cardiac Rehab.  Discussed start date for Cardiac Rehab with Dr. Rockey Situ.  Patient is scheduled for Cardiac Rehab orientation on February 20, 2017 at 11:30 a.m.  Orientation packet given to patient with instructions to complete packet and bring with her to orientation appointment.    *Provided patient with information on [facility] Support Group. ?Invited / Encourage patient to attend. ?  Roanna Epley, RN, BSN, [facility] Cardiovascular and Pulmonary Nurse Navigator ?

## 2017-02-12 NOTE — Progress Notes (Signed)
[place] [facility] [place] NAME: Jenna Young    MR#:  [id number]  DATE OF BIRTH:  Apr 18, 1931  SUBJECTIVE:  CHIEF COMPLAINT:   Chief Complaint  Patient presents with  . Chest Pain  . Bradycardia   The patient has no complaints. REVIEW OF SYSTEMS:  Review of Systems  Constitutional: Negative for chills, fever and malaise/fatigue.  HENT: Negative for sore throat.   Eyes: Negative for blurred vision and double vision.  Respiratory: Negative for cough, hemoptysis, shortness of breath, wheezing and stridor.   Cardiovascular: Negative for chest pain, palpitations, orthopnea and leg swelling.  Gastrointestinal: Negative for abdominal pain, blood in stool, diarrhea, melena, nausea and vomiting.  Genitourinary: Negative for dysuria, flank pain and hematuria.  Musculoskeletal: Negative for back pain and joint pain.  Neurological: Negative for dizziness, sensory change, focal weakness, seizures, loss of consciousness, weakness and headaches.  Endo/Heme/Allergies: Negative for polydipsia.  Psychiatric/Behavioral: Negative for depression. The patient is not nervous/anxious.     DRUG ALLERGIES:   Allergies  Allergen Reactions  . Buspirone Hcl     REACTION: HEART PALPITATIONS  . Codeine     REACTION: nausea and vomiting  . Hydrochlorothiazide     REACTION: syncope-possibly from dehydration  . Lansoprazole     REACTION: abd pain  . Minocycline Hcl   . Penicillins     REACTION: mouth numbness Has patient had a PCN reaction causing immediate rash, facial/tongue/throat swelling, SOB or lightheadedness with hypotension: No Has patient had a PCN reaction causing severe rash involving mucus membranes or skin necrosis: No Has patient had a PCN reaction that required hospitalization: No Has patient had a PCN reaction occurring within the last 10 years: No If all of the above answers are "NO", then may proceed with Cephalosporin use.  . Ramipril    Cough   . Sulfa Antibiotics Nausea Only   VITALS:  Blood pressure (!) 112/56, pulse (!) 54, temperature 98.4 F (36.9 C), temperature source Oral, resp. rate 17, height 5\' 1"  (1.549 m), weight 138 lb (62.6 kg), SpO2 97 %. PHYSICAL EXAMINATION:  Physical Exam  Constitutional: She is oriented to person, place, and time and well-developed, well-nourished, and in no distress.  HENT:  Head: Normocephalic.  Mouth/Throat: Oropharynx is clear and moist.  Eyes: Conjunctivae and EOM are normal. Pupils are equal, round, and reactive to light. No scleral icterus.  Neck: Normal range of motion. Neck supple. No JVD present. No tracheal deviation present.  Cardiovascular: Normal rate, regular rhythm and normal heart sounds. Exam reveals no gallop.  No murmur heard. Pulmonary/Chest: Effort normal and breath sounds normal. No respiratory distress. She has no wheezes. She has no rales.  Abdominal: Soft. Bowel sounds are normal. She exhibits no distension. There is no tenderness. There is no rebound.  Musculoskeletal: Normal range of motion. She exhibits no edema or tenderness.  Neurological: She is alert and oriented to person, place, and time. No cranial nerve deficit.  Skin: No rash noted. No erythema.  Psychiatric: Affect normal.   LABORATORY PANEL:  Female CBC Recent Labs  Lab 02/12/17 0508  WBC 6.5  HGB 10.1*  HCT 30.3*  PLT 179   ------------------------------------------------------------------------------------------------------------------ Chemistries  Recent Labs  Lab 02/12/17 0508  NA 134*  K 3.9  CL 107  CO2 22  GLUCOSE 91  BUN 13  CREATININE 0.89  CALCIUM 8.5*   RADIOLOGY:  No results found. ASSESSMENT AND PLAN:  Jenna Young  is a 83 y.o.  female with a known history of GERD, anxiety, hypertension, hyperlipidemia and peripheral vascular disease is presenting to the ED with a chief complaint of burning in the chest. Patient is reporting that it started at around 7:30 AM  while she was trying to fix her breakfast. Denies any shortness of breath. She felt   some squeezing sensation in the left side of her Chest. Denies any dizziness or loss of consciousness. In the ED troponin is elevated at 0.89 and EKG has revealed ST wave changes in lead 3 and aVF.   # Non-STEMI She is treated with heparin drip, ASA and statin. Cardiac cath done by Dr. Saunders Revel: 1. Significant mid RCA disease with sequential, calcified 80 and 95% stenoses. Element of thrombus is likely also present. 2. 90% ostial stenosis involving small OM1 branch. 3. Mildly reduced left ventricular contraction with inferior hypokinesis (LVEF ~45%). 4. Mildly elevated left ventricular filling pressure. 5. Unsuccessful PCI to mid RCA due to inability to cross the mid RCA stenosis despite aggressive buddy wire support. Per Dr. Rockey Situ, Continue heparin gtt for total of 48 hours of therapy (started at 13:16 on 12/4) Imdur, beta-blocker on hold for bradycardia If she has unstable angina symptoms as outpatient could consider PCI with atherectomy to mid RCA in ~ 4 weeks at [facility] Aspirin and clopidogrel for at least 12 months.  #Hyperlipidemia Continue Lipitor  #Essential hypertension Blood pressure is stable Continue home medication Cozaar  #Sinus bradycardia beta-blocker on hold for bradycardia  Discussed with Dr. Rockey Situ. All the records are reviewed and case discussed with Care Management/Social Worker. Management plans discussed with the patient, her daughter and son, And they are in agreement.  CODE STATUS: Full Code  TOTAL TIME TAKING CARE OF THIS PATIENT: 35 minutes.   More than 50% of the time was spent in counseling/coordination of care: YES  POSSIBLE D/C IN 1-2 DAYS, DEPENDING ON CLINICAL CONDITION.   Demetrios Loll M.D on 02/12/2017 at 2:40 PM  Between 7am to 6pm - Pager - [phone]  After 6pm go to [email or web address] - [facility]

## 2017-02-12 NOTE — Progress Notes (Signed)
Progress Note  Patient Name: Jenna Young Date of Encounter: 02/12/2017  Primary Cardiologist: Rockey Situ  Subjective   No further chest pain or SOB. Tolerating medications without issues. Labs stable. On heparin gtt for total of 48 hours of therapy. Troponin trending down. Echo pending.   Inpatient Medications    Scheduled Meds: . aspirin EC  81 mg Oral Daily  . atorvastatin  40 mg Oral q1800  . calcium-vitamin D  2 tablet Oral Daily  . cholecalciferol  2,000 Units Oral Daily  . clopidogrel  75 mg Oral Q breakfast  . [START ON 03/13/2017] cyanocobalamin  1,000 mcg Intramuscular Q90 days  . Difluprednate  1 drop Right Eye TID  . gatifloxacin  1 drop Right Eye QID  . isosorbide mononitrate  30 mg Oral Daily  . ketorolac  1 drop Right Eye QID  . metoprolol tartrate  12.5 mg Oral BID  . pantoprazole  40 mg Oral Daily  . sodium chloride flush  3 mL Intravenous Q12H   Continuous Infusions: . sodium chloride    . heparin 800 Units/hr (02/12/17 0720)   PRN Meds: sodium chloride, acetaminophen, nitroGLYCERIN, ondansetron (ZOFRAN) IV, sodium chloride flush   Vital Signs    Vitals:   02/11/17 2009 02/11/17 2154 02/12/17 0540 02/12/17 0731  BP: (!) 107/44 (!) 126/40 (!) 104/53 (!) 112/56  Pulse: 79  (!) 55 (!) 54  Resp:    17  Temp: 98.6 F (37 C)  98.4 F (36.9 C) 98.4 F (36.9 C)  TempSrc: Oral  Oral Oral  SpO2: 94%  92% 97%  Weight:      Height:        Intake/Output Summary (Last 24 hours) at 02/12/2017 0759 Last data filed at 02/12/2017 0720 Gross per 24 hour  Intake 91.26 ml  Output 700 ml  Net -608.74 ml   Filed Weights   02/11/17 0733  Weight: 138 lb (62.6 kg)    Telemetry    Sinus bradycardia, low 50s bpm, 9 beats NSVT - Personally Reviewed  ECG    n/a - Personally Reviewed  Physical Exam   GEN: No acute distress.   Neck: No JVD. Cardiac: Bradycardic, no murmurs, rubs, or gallops. Right radial cath site without bleeding, bruising, swelling,  warmth, or erythema. Radial pulse 2+.  Respiratory: Clear to auscultation bilaterally.  GI: Soft, nontender, non-distended.   MS: No edema; No deformity. Neuro:  Alert and oriented x 3; Nonfocal.  Psych: Normal affect.  Labs    Chemistry Recent Labs  Lab 02/10/17 1132 02/12/17 0508  NA 137 134*  K 4.0 3.9  CL 103 107  CO2 23 22  GLUCOSE 137* 91  BUN 11 13  CREATININE 0.91 0.89  CALCIUM 9.5 8.5*  GFRNONAA 57* 58*  GFRAA >60 >60  ANIONGAP 11 5     Hematology Recent Labs  Lab 02/10/17 1132 02/11/17 0509 02/12/17 0508  WBC 7.0 7.5 6.5  RBC 4.23 3.86 3.73*  HGB 11.6* 10.4* 10.1*  HCT 34.4* 31.5* 30.3*  MCV 81.2 81.6 81.2  MCH 27.5 27.0 27.2  MCHC 33.9 33.1 33.4  RDW 16.6* 16.6* 16.4*  PLT 220 219 179    Cardiac Enzymes Recent Labs  Lab 02/10/17 1558 02/10/17 2028 02/11/17 0509 02/11/17 1810  TROPONINI 8.83* 20.95* 32.09* 24.92*   No results for input(s): TROPIPOC in the last 168 hours.   BNPNo results for input(s): BNP, PROBNP in the last 168 hours.   DDimer No results for input(s): DDIMER  in the last 168 hours.   Radiology    Dg Chest 2 View  Result Date: 02/10/2017 IMPRESSION: Mild cardiomegaly without pulmonary vascular congestion. No alveolar pneumonia. Chronic bronchitic-smoking related changes, stable. Thoracic aortic atherosclerosis. Electronically Signed   By: David  Martinique M.D.   On: 02/10/2017 11:24    Cardiac Studies   LHC 02/11/2017: Coronary Findings   Diagnostic  Dominance: Right  Left Main  Vessel is large.  Left Anterior Descending  Vessel is moderate in size. There is mild diffuse disease throughout the vessel. The distal LAD tapers to a small vessel, terminating before the apex.  First Diagonal Branch  Vessel is moderate in size.  Left Circumflex  Vessel is moderate in size.  First Obtuse Marginal Branch  Ost 1st Mrg lesion 90% stenosed  Ost 1st Mrg lesion is 90% stenosed.  Second Obtuse Marginal Branch  Vessel is small  in size.  Third Obtuse Marginal Branch  Vessel is small in size.  Right Coronary Artery  Vessel is moderate in size.  Prox RCA lesion 30% stenosed  Prox RCA lesion is 30% stenosed. The lesion is eccentric.  Mid RCA-1 lesion 80% stenosed  Mid RCA-1 lesion is 80% stenosed. The lesion is tubular. The lesion is mildly calcified.  Mid RCA-2 lesion 95% stenosed  Mid RCA-2 lesion is 95% stenosed. The lesion is irregular and thrombotic. The lesion is calcified.  Right Posterior Descending Artery  Vessel is moderate in size.  Right Posterior Atrioventricular Branch  Vessel is moderate in size.  Intervention   Mid RCA-1 lesion  Post-Intervention Lesion Assessment  The intervention was unsuccessful due to inability to cross the lesion with balloon. Pre-interventional TIMI flow is 3. Post-intervention TIMI flow is 3.  There is a 80% residual stenosis post intervention.  Mid RCA-2 lesion  Post-Intervention Lesion Assessment  The intervention was unsuccessful due to inability to cross the lesion with balloon. Pre-interventional TIMI flow is 3. Post-intervention TIMI flow is 3.  There is a 95% residual stenosis post intervention.  Wall Motion              Left Heart   Left Ventricle The left ventricular size is normal. There is mild left ventricular systolic dysfunction. LV end diastolic pressure is mildly elevated. The left ventricular ejection fraction is 45-50% by visual estimate. There are LV function abnormalities due to segmental dysfunction.  Aortic Valve There is no aortic valve stenosis.  Coronary Diagrams   Diagnostic Diagram       Post-Intervention Diagram        Conclusion     Post intervention, there is a 80% residual stenosis.  Post intervention, there is a 95% residual stenosis.   Conclusions: 1. Significant mid RCA disease with sequential, calcified 80 and 95% stenoses. Element of thrombus is likely also present. 2. 90% ostial stenosis involving small OM1  branch. 3. Mildly reduced left ventricular contraction with inferior hypokinesis (LVEF ~45%). 4. Mildly elevated left ventricular filling pressure. 5. Unsuccessful PCI to mid RCA due to inability to cross the mid RCA stenosis despite aggressive buddy wire support.  Recommendations: 1. Restart heparin 2 hours after TR band removal to complete 48 hours of heparin from time of admission. 2. Aspirin and clopidogrel for at least 12 months. 3. Optimize antianginal therapy. If patient has continued symptoms, PCI with atherectomy of the mid RCA could be considered in 4 weeks.    TTE pending.    Patient Profile     83 y.o. 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  Assessment & Plan    1. NSTEMI: -Currently, without chest pain -Troponin peaked at 32, now down trending -Continue DAPT with ASA and Plavix for 12 months -Continue heparin gtt for total of 48 hours of therapy (started at 13:16 on 12/4) -Close outpatient follow up -Imdur -Consider PCI with atherectomy to mid RCA in ~ 4 weeks at [facility], particularly if patient has symptoms despite optimal medical therapy   2. ICM: -She does not appear volume overloaded at this time -Echo pending -Hold Lopressor this morning given bradycardia with heart rates in the low 50s bpm, restart as heart rate allows  3. HTN: -BP stable -Imdur as above -Hold Lopressor as above  4. HLD: -Lipitor   For questions or updates, please contact [place] Please consult [email or web address] for contact info under Cardiology/STEMI.    Signed, Christell Faith, PA-C [place] Pager: [phone] 02/12/2017, 7:59 AM   Attending Note Patient seen and examined, agree with detailed note above,  Patient presentation and plan discus details of cardiac catheterization reviewed with her sed on rounds.   No significant pain overnight Last chest pain was in the emergency room on  admission, none since then Details of cardiac catheterization reviewed with her, unable to cross stenotic lesion in RCA,  Heavily calcified  On physical exam she appears comfortable sitting up in bed, no JVD, lungs clear to auscultation bilaterally, abdomen soft nontender no significant lower extremity edema  Lab work reviewed showing normal BMP, creatinine 0.89, hematocrit 30  A/P: NSTEMI 1. NSTEMI: -Troponin down trending, 24 -Continue DAPT with ASA and Plavix for 12 months -Continue heparin gtt for total of 48 hours of therapy (started at 13:16 on 12/4) -Imdur, beta-blocker on hold for bradycardia If she has unstable angina symptoms as outpatient could consider PCI with atherectomy to mid RCA in ~ 4 weeks at [facility] The above was discussed with her in detail  Needs PT, out of bed Greater than 50% was spent in counseling and coordination of care with patient Total encounter time 25 minutes or more   Signed: Esmond Plants  M.D., Ph.D. [facility]

## 2017-02-13 ENCOUNTER — Telehealth: Payer: Self-pay | Admitting: *Deleted

## 2017-02-13 LAB — HEPARIN LEVEL (UNFRACTIONATED): Heparin Unfractionated: 0.39 IU/mL (ref 0.30–0.70)

## 2017-02-13 LAB — CBC
HCT: 30.6 % — ABNORMAL LOW (ref 35.0–47.0)
Hemoglobin: 10 g/dL — ABNORMAL LOW (ref 12.0–16.0)
MCH: 26.7 pg (ref 26.0–34.0)
MCHC: 32.7 g/dL (ref 32.0–36.0)
MCV: 81.5 fL (ref 80.0–100.0)
Platelets: 180 10*3/uL (ref 150–440)
RBC: 3.75 MIL/uL — ABNORMAL LOW (ref 3.80–5.20)
RDW: 16.5 % — ABNORMAL HIGH (ref 11.5–14.5)
WBC: 7.5 10*3/uL (ref 3.6–11.0)

## 2017-02-13 MED ORDER — LOSARTAN POTASSIUM 100 MG PO TABS: 50.0000 mg | ORAL_TABLET | Freq: Every day | ORAL | 0 refills | Status: DC

## 2017-02-13 MED ORDER — ATORVASTATIN CALCIUM 40 MG PO TABS: 40.0000 mg | ORAL_TABLET | Freq: Every day | ORAL | 2 refills | Status: DC

## 2017-02-13 MED ORDER — CLOPIDOGREL BISULFATE 75 MG PO TABS: 75.0000 mg | ORAL_TABLET | Freq: Every day | ORAL | 2 refills | Status: DC

## 2017-02-13 MED ORDER — NITROGLYCERIN 0.4 MG SL SUBL: 0.4000 mg | SUBLINGUAL_TABLET | SUBLINGUAL | 2 refills | Status: DC | PRN

## 2017-02-13 NOTE — Progress Notes (Signed)
ANTICOAGULATION CONSULT NOTE - Initial Consult  Pharmacy Consult for heparin drip Indication: chest pain/ACS  Allergies  Allergen Reactions  . Buspirone Hcl     REACTION: HEART PALPITATIONS  . Codeine     REACTION: nausea and vomiting  . Hydrochlorothiazide     REACTION: syncope-possibly from dehydration  . Lansoprazole     REACTION: abd pain  . Minocycline Hcl   . Penicillins     REACTION: mouth numbness Has patient had a PCN reaction causing immediate rash, facial/tongue/throat swelling, SOB or lightheadedness with hypotension: No Has patient had a PCN reaction causing severe rash involving mucus membranes or skin necrosis: No Has patient had a PCN reaction that required hospitalization: No Has patient had a PCN reaction occurring within the last 10 years: No If all of the above answers are "NO", then may proceed with Cephalosporin use.  . Ramipril     Cough   . Sulfa Antibiotics Nausea Only    Patient Measurements: Height: 5\' 1"  (154.9 cm) Weight: 138 lb (62.6 kg) IBW/kg (Calculated) : 47.8 Heparin Dosing Weight: 62 kg HDW = 62 kg. Calculated using RN reported patient weight of 67.1 kg.  Vital Signs: Temp: 98.5 F (36.9 C) (12/06 0457) Temp Source: Oral (12/05 2044) BP: 121/43 (12/06 0457) Pulse Rate: 65 (12/06 0457)  Labs: Recent Labs    02/10/17 1132  02/10/17 2028 02/11/17 0509 02/11/17 1810 02/12/17 0508 02/12/17 1302 02/13/17 0448  HGB 11.6*  --   --  10.4*  --  10.1*  --  10.0*  HCT 34.4*  --   --  31.5*  --  30.3*  --  30.6*  PLT 220  --   --  219  --  179  --  180  LABPROT  --   --   --  15.1  --   --   --   --   INR  --   --   --  1.20  --   --   --   --   HEPARINUNFRC  --    < > 0.65 0.43  --  0.31 0.38 0.39  CREATININE 0.91  --   --   --   --  0.89  --   --   TROPONINI 0.89*   < > 20.95* 32.09* 24.92*  --   --   --    < > = values in this interval not displayed.    Estimated Creatinine Clearance: 40.6 mL/min (by C-G formula based on SCr of  0.89 mg/dL).  Assessment: Pharmacy consulted to dose and monitor heparin in this 83 year old female for ACS. Baseline labs have been ordered. Patient was not taking any anticoagulants PTA per med rec.   Goal of Therapy:  Heparin level 0.3-0.7 units/ml Monitor platelets by anticoagulation protocol: Yes   Plan:  Heparin level therapeutic x 2 since restarting heparin. Continue heparin drip at 800 units/hr and recheck with AM labs  12/6 @ 0500 HL 0.39 therapeutic. Will continue current rate and will recheck next HL w/ am labs.  Tobie Lords, PharmD, BCPS Clinical Pharmacist 02/13/2017,6:10 AM

## 2017-02-13 NOTE — Progress Notes (Signed)
Progress Note  Patient Name: Jenna Young Date of Encounter: 02/13/2017  Primary Cardiologist: Ida Rogue, MD  Subjective   No chest pain or sob.  Ambulated some yesterday w/o difficulty.  Inpatient Medications    Scheduled Meds: . aspirin EC  81 mg Oral Daily  . atorvastatin  40 mg Oral q1800  . calcium-vitamin D  2 tablet Oral Daily  . cholecalciferol  2,000 Units Oral Daily  . clopidogrel  75 mg Oral Q breakfast  . [START ON 03/13/2017] cyanocobalamin  1,000 mcg Intramuscular Q90 days  . Difluprednate  1 drop Right Eye TID  . gatifloxacin  1 drop Right Eye QID  . isosorbide mononitrate  30 mg Oral Daily  . ketorolac  1 drop Right Eye QID  . pantoprazole  40 mg Oral Daily  . sodium chloride flush  3 mL Intravenous Q12H   Continuous Infusions: . sodium chloride    . heparin 800 Units/hr (02/12/17 1734)   PRN Meds: sodium chloride, acetaminophen, nitroGLYCERIN, ondansetron (ZOFRAN) IV, sodium chloride flush   Vital Signs    Vitals:   02/12/17 1723 02/12/17 2044 02/13/17 0457 02/13/17 0752  BP: (!) 128/49 (!) 110/36 (!) 121/43 (!) 130/52  Pulse: 71 66 65 65  Resp:  17  18  Temp:  98.7 F (37.1 C) 98.5 F (36.9 C) 98.7 F (37.1 C)  TempSrc:  Oral  Oral  SpO2:  97% 96% 94%  Weight:      Height:        Intake/Output Summary (Last 24 hours) at 02/13/2017 0846 Last data filed at 02/13/2017 0448 Gross per 24 hour  Intake 1009.87 ml  Output 600 ml  Net 409.87 ml   Filed Weights   02/11/17 0733  Weight: 138 lb (62.6 kg)    Physical Exam   GEN: Well nourished, well developed, in no acute distress.  HEENT: Grossly normal.  Neck: Supple, no JVD, carotid bruits, or masses. Cardiac: RRR, 2/6 syst murmur @ upper sternal borders/LLSB.  No rubs, or gallops. No clubbing, cyanosis, edema.  Radials/DP/PT 2+ and equal bilaterally. R wrist cath site w/o bleeding/bruit/hematoma. Respiratory:  Respirations regular and unlabored, clear to auscultation  bilaterally. GI: Soft, nontender, nondistended, BS + x 4. MS: no deformity or atrophy. Skin: warm and dry, no rash. Neuro:  Strength and sensation are intact. Psych: AAOx3.  Normal affect.  Labs    Chemistry Recent Labs  Lab 02/10/17 1132 02/12/17 0508  NA 137 134*  K 4.0 3.9  CL 103 107  CO2 23 22  GLUCOSE 137* 91  BUN 11 13  CREATININE 0.91 0.89  CALCIUM 9.5 8.5*  GFRNONAA 57* 58*  GFRAA >60 >60  ANIONGAP 11 5     Hematology Recent Labs  Lab 02/11/17 0509 02/12/17 0508 02/13/17 0448  WBC 7.5 6.5 7.5  RBC 3.86 3.73* 3.75*  HGB 10.4* 10.1* 10.0*  HCT 31.5* 30.3* 30.6*  MCV 81.6 81.2 81.5  MCH 27.0 27.2 26.7  MCHC 33.1 33.4 32.7  RDW 16.6* 16.4* 16.5*  PLT 219 179 180    Cardiac Enzymes Recent Labs  Lab 02/10/17 1558 02/10/17 2028 02/11/17 0509 02/11/17 1810  TROPONINI 8.83* 20.95* 32.09* 24.92*    Lab Results  Component Value Date   CHOL 173 02/11/2017   HDL 53 02/11/2017   LDLCALC 100 (H) 02/11/2017   LDLDIRECT 116.6 01/27/2013   TRIG 98 02/11/2017   CHOLHDL 3.3 02/11/2017     Radiology    No results found.  Telemetry    RSR, 17 beat run of PSVT - Personally Reviewed  Cardiac Studies   2D Echocardiogram 12.4.2018  Study Conclusions   - Left ventricle: The cavity size was normal. There was mild   concentric hypertrophy. Systolic function was normal. The   estimated ejection fraction was in the range of 50% to 55%.   Hypokinesis of the inferior myocardium. Doppler parameters are   consistent with abnormal left ventricular relaxation (grade 1   diastolic dysfunction). - Aortic valve: There was mild regurgitation. - Mitral valve: There was mild regurgitation. - Left atrium: The atrium was mildly dilated. - Right ventricle: Systolic function was normal. - Pulmonary arteries: Systolic pressure was within the normal   range. _____________   [facility] 02/11/2017: Coronary Findings   Diagnostic  Dominance: Right  Left Main  Vessel is  large.  Left Anterior Descending  Vessel is moderate in size. There is mild diffuse disease throughout the vessel. The distal LAD tapers to a small vessel, terminating before the apex.  First Diagonal Branch  Vessel is moderate in size.  Left Circumflex  Vessel is moderate in size.  First Obtuse Marginal Branch  Ost 1st Mrg lesion 90% stenosed  Ost 1st Mrg lesion is 90% stenosed.  Second Obtuse Marginal Branch  Vessel is small in size.  Third Obtuse Marginal Branch  Vessel is small in size.  Right Coronary Artery  Vessel is moderate in size.  Prox RCA lesion 30% stenosed  Prox RCA lesion is 30% stenosed. The lesion is eccentric.  Mid RCA-1 lesion 80% stenosed  Mid RCA-1 lesion is 80% stenosed. The lesion is tubular. The lesion is mildly calcified.  Mid RCA-2 lesion 95% stenosed  Mid RCA-2 lesion is 95% stenosed. The lesion is irregular and thrombotic. The lesion is calcified.  Right Posterior Descending Artery  Vessel is moderate in size.  Right Posterior Atrioventricular Branch  Vessel is moderate in size.  Intervention   Mid RCA-1 lesion  Post-Intervention Lesion Assessment  The intervention was unsuccessful due to inability to cross the lesion with balloon. Pre-interventional TIMI flow is 3. Post-intervention TIMI flow is 3.  There is a 80% residual stenosis post intervention.  Mid RCA-2 lesion  Post-Intervention Lesion Assessment  The intervention was unsuccessful due to inability to cross the lesion with balloon. Pre-interventional TIMI flow is 3. Post-intervention TIMI flow is 3.  There is a 95% residual stenosis post intervention.   Recommendations: 1. Restart heparin 2 hours after TR band removal to complete 48 hours of heparin from time of admission. 2. Aspirin and clopidogrel for at least 12 months. 3. Optimize antianginal therapy. If patient has continued symptoms, PCI with atherectomy of the mid RCA could be considered in 4 weeks.   Patient Profile     83  y.o. 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 Unsuccessful PCI attempt of RCA.  Assessment & Plan    1.  NSTEMI/CAD:  S/p cath and attempted PCI of the RCA however, interventional team unable to cross lesion w/ balloon.  She remains on heparin - to be shut off today (48 hrs total duration).  Echo showed nl EF.  Ambulated some yesterday w/o chest pain.  Plan to d/c heparin as above, ambulate this afternoon.  Cont asa, plavix, nitrate, and statin Rx.   blocker previously held 2/2 bradycardia following a dose on 12/4.  HRs currently mid 60's - will cont to hold.  If stable  with ambulation today, prob this afternoon w/ early outpt f/u and cardiac rehab.  If she develops recurrent Ss, we would likely arrange for atherectomy @ [facility] in ~ 4 wks.  2.  ICM:  EF ~45% by LV gram @ time of event.  F/u echo showed EF 50-55% w/ inf HK.  Euvolemic on exam.  3.  HL:  LDL 100.  Currently on lipitor 40.  4.  Essential HTN:  Stable on nitrate.  Signed, Murray Hodgkins, NP  02/13/2017, 8:46 AM    For questions or updates, please contact   Please consult [email or web address] for contact info under Cardiology/STEMI.

## 2017-02-13 NOTE — Care Management Important Message (Signed)
Important Message  Patient Details  Name: Jenna Young MRN: [id number] Date of Birth: 1931-04-25   Medicare Important Message Given:  Yes Signed IM notice given    Katrina Stack, RN 02/13/2017, 9:28 AM

## 2017-02-13 NOTE — Plan of Care (Signed)
Daughter supports her health needs at home

## 2017-02-13 NOTE — Plan of Care (Signed)
Nutrition Education Note  RD consulted for nutrition education regarding heart-healthy diet.  Met with patient and her daughter at bedside. She reports her appetite is good and unchanged from baseline. She typically eats 2 real meals per day and a very small lunch. Breakfast is usually cereal in 1% milk. Once a week on the weekend she may have bacon and eggs for breakfast. Lunch is a pack of Nabs. Dinner is usually chicken with vegetables, but once a week they go out for fish.  In setting of patient's advanced age patient would be at risk of malnutrition if too much dietary restrictions are placed. Per her dietary recall she is already eating within the guidelines of a heart-healthy diet. Decided it would be more appropriate to just reinforce low sodium diet.  RD provided "Low Sodium Nutrition Therapy" handout from the Academy of Nutrition and Dietetics. Reviewed patient's dietary recall. Provided examples on ways to decrease sodium intake in diet. Discouraged intake of processed foods and use of salt shaker. Encouraged fresh fruits and vegetables as well as whole grain sources of carbohydrates to maximize fiber intake. Teach back method used.  Expect good compliance.  Body mass index is 26.07 kg/m. Pt meets criteria for overweight based on current BMI. Of note, older adults who are overweight have similar or lower risk of all-cause mortality than older adults in the normal weight range, so would not recommend any weight loss.  Current diet order is Heart Healthy, patient is consuming approximately 50-100% of meals at this time. Labs and medications reviewed. No further nutrition interventions warranted at this time. RD contact information provided. If additional nutrition issues arise, please re-consult RD.   Willey Blade, MS, [place], LDN Office: [phone] Pager: [phone] After Hours/Weekend Pager: [phone]

## 2017-02-13 NOTE — Discharge Instructions (Signed)
Heart healthy diet

## 2017-02-13 NOTE — Telephone Encounter (Signed)
-----   Message from Blain Pais sent at 02/13/2017  9:38 AM EST ----- Regarding: tcm/ph 12/14 2:20 Dr. Rockey Situ

## 2017-02-13 NOTE — Discharge Summary (Signed)
[facility] [place] NAME: Jenna Young    MR#:  [id number]  DATE OF BIRTH:  [birth year]  DATE OF ADMISSION:  02/10/2017   ADMITTING PHYSICIAN: Nicholes Mango, MD  DATE OF DISCHARGE: 02/13/2017 PRIMARY CARE PHYSICIAN: Tower, [name], MD   ADMISSION DIAGNOSIS:  NSTEMI (non-ST elevated myocardial infarction) ([place]) [I21.4] DISCHARGE DIAGNOSIS:  Active Problems:   NSTEMI (non-ST elevated myocardial infarction) ([place])  SECONDARY DIAGNOSIS:   Past Medical History:  Diagnosis Date  . Allergic rhinitis   . Anemia   . Anxiety   . GERD (gastroesophageal reflux disease)   . History of radiation therapy 04/29/16- 05/24/16   Left Breast 40.05 Gy in 15 fractions, Left Breast boost 10 Gy in 5 fractions.   . Hyperlipidemia   . Hypertension    treated by Dr Thurnell Garbe recently with syncope episode  . Osteopenia   . Osteoporosis    HOSPITAL COURSE:   [name] a83 y.o.femalewith a known history of GERD, anxiety, hypertension, hyperlipidemia and peripheral vascular disease is presenting to the ED with a chief complaint of burning in the chest. Patient is reporting that it started at around 7:30 AM while she was trying to fix her breakfast. Denies any shortness of breath. She felt  some squeezing sensation in the left side of her Chest.Denies any dizziness or loss of consciousness. In the ED troponin is elevated at 0.89and EKG has revealed ST wave changes in lead 3 and aVF.  # Non-STEMI She is treated with heparin drip, ASA and statin. Cardiac cath done by Dr. Saunders Revel: 1. Significant mid RCA disease with sequential, calcified 80 and 95% stenoses. Element of thrombus is likely also present. 2. 90% ostial stenosis involving small OM1 branch. 3. Mildly reduced left ventricular contraction with inferior hypokinesis (LVEF ~45%). 4. Mildly elevated left ventricular filling pressure. 5. Unsuccessful PCI to mid RCA due to inability to cross the mid  RCA stenosis despite aggressive buddy wire support. Per Dr. Rockey Situ, Continue heparin gtt for total of 48 hours of therapy (started at 13:16 on 12/4) Imdur,beta-blocker on hold for bradycardia If she has unstable angina symptoms as outpatient could considerPCI with atherectomy to mid RCA in ~ 4 weeks at [facility] Aspirin and clopidogrel for at least 12 months.  #Hyperlipidemia Continue Lipitor  #Essential hypertension Blood pressure is stable Continue home medication Cozaar but decrease to 50 mg po daily.  #Sinus bradycardia beta-blocker on hold for bradycardia  Discussed with Dr. Rockey Situ. DISCHARGE CONDITIONS:  Stable, discharge to home today. CONSULTS OBTAINED:  Treatment Team:  Wellington Hampshire, MD DRUG ALLERGIES:   Allergies  Allergen Reactions  . Buspirone Hcl     REACTION: HEART PALPITATIONS  . Codeine     REACTION: nausea and vomiting  . Hydrochlorothiazide     REACTION: syncope-possibly from dehydration  . Lansoprazole     REACTION: abd pain  . Minocycline Hcl   . Penicillins     REACTION: mouth numbness Has patient had a PCN reaction causing immediate rash, facial/tongue/throat swelling, SOB or lightheadedness with hypotension: No Has patient had a PCN reaction causing severe rash involving mucus membranes or skin necrosis: No Has patient had a PCN reaction that required hospitalization: No Has patient had a PCN reaction occurring within the last 10 years: No If all of the above answers are "NO", then may proceed with Cephalosporin use.  . Ramipril     Cough   . Sulfa Antibiotics Nausea Only   DISCHARGE MEDICATIONS:  Allergies as of 02/13/2017      Reactions   Buspirone Hcl    REACTION: HEART PALPITATIONS   Codeine    REACTION: nausea and vomiting   Hydrochlorothiazide    REACTION: syncope-possibly from dehydration   Lansoprazole    REACTION: abd pain   Minocycline Hcl    Penicillins    REACTION: mouth numbness Has patient had a PCN reaction  causing immediate rash, facial/tongue/throat swelling, SOB or lightheadedness with hypotension: No Has patient had a PCN reaction causing severe rash involving mucus membranes or skin necrosis: No Has patient had a PCN reaction that required hospitalization: No Has patient had a PCN reaction occurring within the last 10 years: No If all of the above answers are "NO", then may proceed with Cephalosporin use.   Ramipril    Cough   Sulfa Antibiotics Nausea Only      Medication List    TAKE these medications   aspirin EC 81 MG EC tablet Generic drug:  aspirin Take 81 mg by mouth daily as needed for pain. Swallow whole.   atorvastatin 40 MG tablet Commonly known as:  LIPITOR Take 1 tablet (40 mg total) by mouth daily at 6 PM.   Besifloxacin HCl 0.6 % Susp Place 1 drop into the right eye 3 (three) times daily.   Bromfenac Sodium 0.07 % Soln Place 1 drop into the right eye at bedtime.   calcium-vitamin D 500-200 MG-UNIT tablet Commonly known as:  OSCAL WITH D Take 2 tablets by mouth daily.   clopidogrel 75 MG tablet Commonly known as:  PLAVIX Take 1 tablet (75 mg total) by mouth daily with breakfast.   cyanocobalamin 1000 MCG/ML injection Commonly known as:  (VITAMIN B-12) Inject 1,000 mcg into the muscle every 3 (three) months.   Difluprednate 0.05 % Emul Place 1 drop into the right eye 3 (three) times daily.   isosorbide mononitrate 30 MG 24 hr tablet Commonly known as:  IMDUR Take 30 mg by mouth daily.   losartan 100 MG tablet Commonly known as:  COZAAR Take 0.5 tablets (50 mg total) by mouth daily. What changed:  how much to take   nitroGLYCERIN 0.4 MG SL tablet Commonly known as:  NITROSTAT Place 1 tablet (0.4 mg total) under the tongue every 5 (five) minutes x 3 doses as needed for chest pain.   omeprazole 20 MG capsule Commonly known as:  PRILOSEC Take 20 mg by mouth daily.   Vitamin D 2000 units Caps Take 1 capsule by mouth daily.        DISCHARGE  INSTRUCTIONS:  See AVS.  If you experience worsening of your admission symptoms, develop shortness of breath, life threatening emergency, suicidal or homicidal thoughts you must seek medical attention immediately by calling 911 or calling your MD immediately  if symptoms less severe.  You Must read complete instructions/literature along with all the possible adverse reactions/side effects for all the Medicines you take and that have been prescribed to you. Take any new Medicines after you have completely understood and accpet all the possible adverse reactions/side effects.   Please note  You were cared for by a hospitalist during your hospital stay. If you have any questions about your discharge medications or the care you received while you were in the hospital after you are discharged, you can call the unit and asked to speak with the hospitalist on call if the hospitalist that took care of you is not available. Once you are discharged, your primary care  physician will handle any further medical issues. Please note that NO REFILLS for any discharge medications will be authorized once you are discharged, as it is imperative that you return to your primary care physician (or establish a relationship with a primary care physician if you do not have one) for your aftercare needs so that they can reassess your need for medications and monitor your lab values.    On the day of Discharge:  VITAL SIGNS:  Blood pressure (!) 130/52, pulse 65, temperature 98.7 F (37.1 C), temperature source Oral, resp. rate 18, height 5\' 1"  (1.549 m), weight 138 lb (62.6 kg), SpO2 94 %. PHYSICAL EXAMINATION:  GENERAL:  83 y.o.-year-old patient lying in the bed with no acute distress.  EYES: Pupils equal, round, reactive to light and accommodation. No scleral icterus. Extraocular muscles intact.  HEENT: Head atraumatic, normocephalic. Oropharynx and nasopharynx clear.  NECK:  Supple, no jugular venous distention. No  thyroid enlargement, no tenderness.  LUNGS: Normal breath sounds bilaterally, no wheezing, rales,rhonchi or crepitation. No use of accessory muscles of respiration.  CARDIOVASCULAR: S1, S2 normal. No murmurs, rubs, or gallops.  ABDOMEN: Soft, non-tender, non-distended. Bowel sounds present. No organomegaly or mass.  EXTREMITIES: No pedal edema, cyanosis, or clubbing.  NEUROLOGIC: Cranial nerves II through XII are intact. Muscle strength 5/5 in all extremities. Sensation intact. Gait not checked.  PSYCHIATRIC: The patient is alert and oriented x 3.  SKIN: No obvious rash, lesion, or ulcer.  DATA REVIEW:   CBC Recent Labs  Lab 02/13/17 0448  WBC 7.5  HGB 10.0*  HCT 30.6*  PLT 180    Chemistries  Recent Labs  Lab 02/12/17 0508  NA 134*  K 3.9  CL 107  CO2 22  GLUCOSE 91  BUN 13  CREATININE 0.89  CALCIUM 8.5*     Microbiology Results  Results for orders placed or performed in visit on 03/18/16  Urine culture     Status: None   Collection Time: 03/18/16  2:27 PM  Result Value Ref Range Status   Culture ESCHERICHIA COLI  Final   Colony Count >=100,000 COLONIES/ML  Final   Organism ID, Bacteria ESCHERICHIA COLI  Final      Susceptibility   Escherichia coli -  (no method available)    AMPICILLIN <=2 Sensitive     AMOX/CLAVULANIC <=2 Sensitive     AMPICILLIN/SULBACTAM <=2 Sensitive     PIP/TAZO <=4 Sensitive     IMIPENEM <=0.25 Sensitive     CEFAZOLIN <=4 Not Reportable     CEFTRIAXONE <=1 Sensitive     CEFTAZIDIME <=1 Sensitive     CEFEPIME <=1 Sensitive     GENTAMICIN <=1 Sensitive     TOBRAMYCIN <=1 Sensitive     CIPROFLOXACIN <=0.25 Sensitive     LEVOFLOXACIN <=0.12 Sensitive     NITROFURANTOIN <=16 Sensitive     TRIMETH/SULFA* <=20 Sensitive      * NR=NOT REPORTABLE,SEE COMMENTORAL therapy:A cefazolin MIC of <32 predicts susceptibility to the oral agents cefaclor,cefdinir,cefpodoxime,cefprozil,cefuroxime,cephalexin,and loracarbef when used for therapy of  uncomplicated UTIs due to E.coli,K.pneumomiae,and P.mirabilis. PARENTERAL therapy: A cefazolinMIC of >8 indicates resistance to parenteralcefazolin. An alternate test method must beperformed to confirm susceptibility to parenteralcefazolin.    RADIOLOGY:  No results found.   Management plans discussed with the patient, her daughter and they are in agreement.  CODE STATUS: Full Code   TOTAL TIME TAKING CARE OF THIS PATIENT: 35 minutes.    Demetrios Loll M.D on 02/13/2017 at 1:47 PM  Between 7am to 6pm - Pager - [phone]  After 6pm go to [email or web address] - [employer] [facility]  Office  [phone]  CC: Primary care physician; Tower, [name], MD   Note: This dictation was prepared with Dragon dictation along with smaller phrase technology. Any transcriptional errors that result from this process are unintentional.

## 2017-02-13 NOTE — Telephone Encounter (Signed)
Patient currently admitted at this time. 

## 2017-02-14 ENCOUNTER — Telehealth: Payer: Self-pay | Admitting: Family Medicine

## 2017-02-14 NOTE — Telephone Encounter (Signed)
Copied from [place] [phone]. Topic: Referral - Request >> Feb 14, 2017  4:57 PM Neva Seat wrote: Pt's Daughter is calling for a referral from Dr. Glori Bickers - Needing help with  home health assistance and for a care plan. Please call daughter Terrance Mass asap

## 2017-02-19 NOTE — Telephone Encounter (Signed)
Meal help and housework are generally not covered with home health - that has to be hired independently   Is she homebound and if so what makes her unable to get out?  What is causing her swallowing issues?  That is not mentioned in her d/c summary.  Is she still having chest pain ?  Looks like she has appt with me 12/24 and sees cardiology earlier than that  Sorry for all the questions-- but I need a diagnosis that home health can use (nursing care or OT or PT) Thanks

## 2017-02-19 NOTE — Telephone Encounter (Signed)
Daughter will be going back home (out of state) soon and she is worried about pt staying home by herself, she needs help taking meds on time/medication management because they change/added a few meds after leaving the hospital., it not swallowing well so they want to make sure she takes her med. Due to her swalling issues pt isn't eating well so they want someone there to make sure she eats, and also help with light house work. Pt is stable but due to her health issues she isn't having her cataract surgery that was planned so that made pt very nervous.  Pt also needs handicap placard form done, form is in your inbox

## 2017-02-19 NOTE — Telephone Encounter (Signed)
FYI.Marland KitchenMarland KitchenSpoken to patient's daughter who was insisting  to let Dr Glori Bickers know about the need for the referral. Patient has a hospital follow up on 03/03/2017.

## 2017-02-19 NOTE — Telephone Encounter (Signed)
Please check in with her regarding pt - see how she is doing and find out what home health care needs she has so I can get working on it  Thanks

## 2017-02-20 ENCOUNTER — Encounter: Payer: Self-pay | Admitting: *Deleted

## 2017-02-20 ENCOUNTER — Encounter: Payer: Medicare Other | Attending: Internal Medicine | Admitting: *Deleted

## 2017-02-20 VITALS — Ht 62.5 in | Wt 138.1 lb

## 2017-02-20 DIAGNOSIS — Z923 Personal history of irradiation: Secondary | ICD-10-CM | POA: Diagnosis not present

## 2017-02-20 DIAGNOSIS — Z87891 Personal history of nicotine dependence: Secondary | ICD-10-CM | POA: Diagnosis not present

## 2017-02-20 DIAGNOSIS — M810 Age-related osteoporosis without current pathological fracture: Secondary | ICD-10-CM | POA: Diagnosis not present

## 2017-02-20 DIAGNOSIS — Z79899 Other long term (current) drug therapy: Secondary | ICD-10-CM | POA: Insufficient documentation

## 2017-02-20 DIAGNOSIS — I252 Old myocardial infarction: Secondary | ICD-10-CM | POA: Diagnosis not present

## 2017-02-20 DIAGNOSIS — I214 Non-ST elevation (NSTEMI) myocardial infarction: Secondary | ICD-10-CM

## 2017-02-20 DIAGNOSIS — I10 Essential (primary) hypertension: Secondary | ICD-10-CM | POA: Insufficient documentation

## 2017-02-20 DIAGNOSIS — K219 Gastro-esophageal reflux disease without esophagitis: Secondary | ICD-10-CM | POA: Diagnosis not present

## 2017-02-20 DIAGNOSIS — Z7982 Long term (current) use of aspirin: Secondary | ICD-10-CM | POA: Diagnosis not present

## 2017-02-20 DIAGNOSIS — Z7902 Long term (current) use of antithrombotics/antiplatelets: Secondary | ICD-10-CM | POA: Insufficient documentation

## 2017-02-20 DIAGNOSIS — E785 Hyperlipidemia, unspecified: Secondary | ICD-10-CM | POA: Insufficient documentation

## 2017-02-20 NOTE — Progress Notes (Signed)
Cardiology Office Note  Date:  02/21/2017   ID:  Jenna Young, DOB January 08, 1932, MRN [id number]  PCP:  Abner Greenspan, MD   Chief Complaint  Patient presents with  . OTHER    Post cardiac cath. Meds reviewed verbally with pt.    HPI:  Jenna Young is a pleasant 83 year old woman  With hx of CAD, NSTEMI Cath 02/11/2017, severe RCA disease, unable to place stent  with palpitations, murmur, leg edema,  Prior history of vertigo DCIS who follows up today for her hypertension, near syncope/syncope   Recent hospital Admission for NSTEMI , 02/10/2017 Hospital records reviewed with the patient in detail D/c 02/13/2017  Cath 1. Significant mid RCA disease with sequential, calcified 80 and 95% stenoses. Element of thrombus is likely also present. 2. 90% ostial stenosis involving small OM1 branch. 3. Mildly reduced left ventricular contraction with inferior hypokinesis (LVEF ~45%). 4. Mildly elevated left ventricular filling pressure. 5. Unsuccessful PCI to mid RCA due to inability to cross the mid RCA stenosis despite aggressive buddy wire support.  Recommendation was made that if she has unstable angina symptoms as outpatient could considerPCI with atherectomy to mid RCA in ~ [facility] weeks at [facility]  Echo 56/4332 Systolic function was normal. The   estimated ejection fraction was in the range of 50% to 55%.   Hypokinesis of the inferior myocardium.  In follow-up today she reports that she feels well, working out with cardiac rehab with no chest discomfort She did report episode of chest burning early in the week came on at rest, felt it was from eating chicken livers with bananas, heartburn She has not been taking her PPI on a regular basis Denies any symptoms on exertion  EKG personally reviewed by myself on todays visit Shows normal sinus rhythm with rate 80 bpm intraventricular conduction delay, left axis deviation  Other past medical history reviewed diagnosis of DCIS, had a biopsy  bx Completed XRT   previously on amlodipine but wanted to change secondary to leg edema Started on bystolic Blood pressure continue to run high Started on HCTZ every other day  could not afford Bystolic long term   was changed to clonidine.  Presented to the emergency room December 31 2015 with near syncope , dehydration  "Fell asleep in the sun " while she was on a swing set, was difficult to arouse, was taken inside by family, had large bowel movement   in the emergency room heart rate 62 bpm , sodium down to 129, creatinine elevated Notes indicate she was only taking clonidine once a day   Seen by our office on 01/03/2016  Blood pressure was 196/80 in the office, she was not taking any of her medications She declined a 30 day monitor, declined ischemic evaluation Restarted on losartan 100 mg daily, HCTZ every other day, Imdur 30 mg daily In follow-up blood pressures 140s up to 150 Recommended to increase Imdur up to 60 mg daily on January 08 2016 On November 5, presented to the emergency room for near-syncope EMS noted systolic pressure 92. Emergency room low pressure 130/62 Orthostatics negative in the emergency room UTI was treated with keflex, Imdur decreased back to 30 mg daily On lab work she did not appear dehydrated, hematocrit 31.7 Subsequent lab work sodium was 126 On her last clinic visit HCTZ held, amlodipine held, continued on losartan 100 daily and Imdur 30 daily  Previous lab work reviewed total cholesterol 200, LDL 115 Hematocrit 32 Previous abdominal symptoms with iron  pills, dizziness, possible vasovagal symptoms   PMH:   has a past medical history of Allergic rhinitis, Anemia, Anxiety, GERD (gastroesophageal reflux disease), History of radiation therapy (04/29/16- 05/24/16), Hyperlipidemia, Hypertension, Osteopenia, and Osteoporosis.  PSH:    Past Surgical History:  Procedure Laterality Date  . APPENDECTOMY    . BREAST LUMPECTOMY WITH RADIOACTIVE SEED  LOCALIZATION Left 02/27/2016   Procedure: LEFT BREAST LUMPECTOMY WITH RADIOACTIVE SEED LOCALIZATION;  Surgeon: Autumn Messing III, MD;  Location: [facility];  Service: General;  Laterality: Left;  . BREAST SURGERY  10/10   benign biopsy  negative  . CESAREAN SECTION    . CHOLECYSTECTOMY    . CORONARY STENT INTERVENTION N/A 02/11/2017   Procedure: CORONARY STENT INTERVENTION;  Surgeon: Nelva Bush, MD;  Location: [facility];  Service: Cardiovascular;  Laterality: N/A;  . FEMUR FRACTURE SURGERY Right 2006  . LEFT HEART CATH AND CORONARY ANGIOGRAPHY N/A 02/11/2017   Procedure: LEFT HEART CATH AND CORONARY ANGIOGRAPHY;  Surgeon: Nelva Bush, MD;  Location: [facility];  Service: Cardiovascular;  Laterality: N/A;  . OVARIAN CYST SURGERY    . RE-EXCISION OF BREAST LUMPECTOMY Left 04/01/2016   Procedure: RE-EXCISION OF LEFT BREAST MEDIAL MARGIN;  Surgeon: Autumn Messing III, MD;  Location: [facility];  Service: General;  Laterality: Left;    Current Outpatient Medications  Medication Sig Dispense Refill  . aspirin (ASPIRIN EC) 81 MG EC tablet Take 81 mg by mouth daily as needed for pain. Swallow whole.     Marland Kitchen atorvastatin (LIPITOR) 40 MG tablet Take 1 tablet (40 mg total) by mouth daily at 6 PM. 30 tablet 2  . Bromfenac Sodium 0.07 % SOLN Place 1 drop into the right eye at bedtime.    . calcium-vitamin D (OSCAL WITH D) 500-200 MG-UNIT per tablet Take 2 tablets by mouth daily.      . Cholecalciferol (VITAMIN D) 2000 UNITS CAPS Take 1 capsule by mouth daily.      . clopidogrel (PLAVIX) 75 MG tablet Take 1 tablet (75 mg total) by mouth daily with breakfast. 30 tablet 2  . cyanocobalamin (,VITAMIN B-12,) 1000 MCG/ML injection Inject 1,000 mcg into the muscle every 3 (three) months.    . Difluprednate 0.05 % EMUL Place 1 drop into the right eye 3 (three) times daily.    . isosorbide mononitrate (IMDUR) 30 MG 24 hr tablet Take 30 mg by mouth daily.    Marland Kitchen  losartan (COZAAR) 100 MG tablet Take 0.5 tablets (50 mg total) by mouth daily. 30 tablet 0  . nitroGLYCERIN (NITROSTAT) 0.4 MG SL tablet Place 1 tablet (0.4 mg total) under the tongue every 5 (five) minutes x 3 doses as needed for chest pain. 30 tablet 2  . omeprazole (PRILOSEC) 20 MG capsule Take 20 mg by mouth daily.     No current facility-administered medications for this visit.      Allergies:   Buspirone hcl; Codeine; Hydrochlorothiazide; Lansoprazole; Minocycline hcl; Penicillins; Ramipril; and Sulfa antibiotics   Social History:  The patient  reports that she quit smoking about 48 years ago. she has never used smokeless tobacco. She reports that she does not drink alcohol or use drugs.   Family History:   family history includes Cancer in her maternal aunt; Diabetes in her maternal aunt; Heart disease in her father; Hypertension in her mother; Macular degeneration in her sister.    Review of Systems: Review of Systems  Constitutional: Negative.   Respiratory: Negative.  Cardiovascular: Negative.   Gastrointestinal: Negative.   Musculoskeletal: Positive for back pain.  Neurological: Positive for loss of consciousness.  Psychiatric/Behavioral: Negative.   All other systems reviewed and are negative.    PHYSICAL EXAM: VS:  BP 140/60 (BP Location: Right Arm, Patient Position: Sitting, Cuff Size: Normal)   Pulse 80   Ht 5\' 2"  (1.575 m)   Wt 139 lb (63 kg)   BMI 25.42 kg/m  , BMI Body mass index is 25.42 kg/m.  GEN: Well nourished, well developed, in no acute distress  HEENT: normal  Neck: no JVD, carotid bruits, or masses Cardiac: RRR; 1+ SEM RSB,  no rubs, or gallops,no edema  Respiratory:  clear to auscultation bilaterally, normal work of breathing GI: soft, nontender, nondistended, + BS MS: no deformity or atrophy  Skin: warm and dry, no rash Neuro:  Strength and sensation are intact Psych: euthymic mood, full affect    Recent Labs: 03/21/2016: ALT  10 02/11/2017: TSH 1.116 02/12/2017: BUN 13; Creatinine, Ser 0.89; Potassium 3.9; Sodium 134 02/13/2017: Hemoglobin 10.0; Platelets 180    Lipid Panel Lab Results  Component Value Date   CHOL 173 02/11/2017   HDL 53 02/11/2017   LDLCALC 100 (H) 02/11/2017   TRIG 98 02/11/2017      Wt Readings from Last 3 Encounters:  02/21/17 139 lb (63 kg)  02/20/17 138 lb 1.6 oz (62.6 kg)  02/11/17 138 lb (62.6 kg)       ASSESSMENT AND PLAN:  Essential hypertension, benign - Plan: EKG 12-Lead Blood pressure adequate, we will add metoprolol succinate 25 mg daily Continue other medications as detailed  Palpitations - Plan: EKG 12-Lead  denies having any palpitations, we will add metoprolol as above  CAD, non-STEMI Long discussion with patient and daughter concerning recent hospitalization, results, possible treatment options for her RCA disease Some burning but felt to be secondary to GERD otherwise no symptoms of unstable angina or unstable angina with exertion Discussed symptoms to watch for and if she does develop symptoms we would consider intervention on the RCA  Pure hypercholesterolemia Continue Lipitor 40 daily  Ductal carcinoma in situ (DCIS) of left breast Treatment is complete, radiation to the left  Syncope No episodes We will continue current medications,  add metoprolol   Total encounter time more than 45 minutes  Greater than 50% was spent in counseling and coordination of care with the patient   Disposition:   F/U  3 months   No orders of the defined types were placed in this encounter.    Signed, Esmond Plants, M.D., Ph.D. 02/21/2017  [place], [place]

## 2017-02-20 NOTE — Telephone Encounter (Signed)
Pt went to cardiac rehab today and will need 36 sessions and does not need home health at this time because it will not be covered.  -Will find out tomorrow if she is homebound but did have 100% blockage in right artery. -Swallowing issues she has always had, can't get pills down easily. Unless with toast or applesauce and cut in half. -Chest pain was just a one time incident  For further questions please call Ann.

## 2017-02-20 NOTE — Patient Instructions (Signed)
Patient Instructions  Patient Details  Name: Jenna Young MRN: [id number] Date of Birth: 1931/05/28 Referring Provider:  Nelva Bush, MD  Below are the personal goals you chose as well as exercise and nutrition goals. Our goal is to help you keep on track towards obtaining and maintaining your goals. We will be discussing your progress on these goals with you throughout the program.  Initial Exercise Prescription: Initial Exercise Prescription - 02/20/17 1400      Date of Initial Exercise RX and Referring Provider   Date  02/20/17    Referring Provider  End      Treadmill   MPH  1    Grade  0    Minutes  15    METs  1.77      NuStep   Level  1    SPM  80    Minutes  15    METs  1.5      Biostep-RELP   Level  1    SPM  50    Minutes  15    METs  1.5      Prescription Details   Frequency (times per week)  3    Duration  Progress to 45 minutes of aerobic exercise without signs/symptoms of physical distress      Intensity   THRR 40-80% of Max Heartrate  102-125    Ratings of Perceived Exertion  11-13    Perceived Dyspnea  0-4      Resistance Training   Training Prescription  Yes    Weight  2 lb    Reps  10-15       Exercise Goals: Frequency: Be able to perform aerobic exercise three times per week working toward 3-5 days per week.  Intensity: Work with a perceived exertion of 11 (fairly light) - 15 (hard) as tolerated. Follow your new exercise prescription and watch for changes in prescription as you progress with the program. Changes will be reviewed with you when they are made.  Duration: You should be able to do 30 minutes of continuous aerobic exercise in addition to a 5 minute warm-up and a 5 minute cool-down routine.  Nutrition Goals: Your personal nutrition goals will be established when you do your nutrition analysis with the dietician.  The following are nutrition guidelines to follow: Cholesterol < 200mg /day Sodium < 1500mg /day Fiber: Women  over 50 yrs - 21 grams per day  Personal Goals: Personal Goals and Risk Factors at Admission - 02/20/17 1426      Core Components/Risk Factors/Patient Goals on Admission   Hypertension  Yes    Intervention  Provide education on lifestyle modifcations including regular physical activity/exercise, weight management, moderate sodium restriction and increased consumption of fresh fruit, vegetables, and low fat dairy, alcohol moderation, and smoking cessation.;Monitor prescription use compliance.    Expected Outcomes  Short Term: Continued assessment and intervention until BP is < 140/1mm HG in hypertensive participants. < 130/35mm HG in hypertensive participants with diabetes, heart failure or chronic kidney disease.;Long Term: Maintenance of blood pressure at goal levels.    Lipids  Yes    Intervention  Provide education and support for participant on nutrition & aerobic/resistive exercise along with prescribed medications to achieve LDL 70mg , HDL >40mg .    Expected Outcomes  Short Term: Participant states understanding of desired cholesterol values and is compliant with medications prescribed. Participant is following exercise prescription and nutrition guidelines.;Long Term: Cholesterol controlled with medications as prescribed, with individualized exercise RX and with  personalized nutrition plan. Value goals: LDL < 70mg , HDL > 40 mg.       Tobacco Use Initial Evaluation: Social History   Tobacco Use  Smoking Status Former Smoker  . Last attempt to quit: 03/11/1968  . Years since quitting: 48.9  Smokeless Tobacco Never Used    Exercise Goals and Review: Exercise Goals    Row Name 02/20/17 1444             Exercise Goals   Increase Physical Activity  Yes       Intervention  Provide advice, education, support and counseling about physical activity/exercise needs.;Develop an individualized exercise prescription for aerobic and resistive training based on initial evaluation findings,  risk stratification, comorbidities and participant's personal goals.       Expected Outcomes  Achievement of increased cardiorespiratory fitness and enhanced flexibility, muscular endurance and strength shown through measurements of functional capacity and personal statement of participant.       Increase Strength and Stamina  Yes       Intervention  Provide advice, education, support and counseling about physical activity/exercise needs.;Develop an individualized exercise prescription for aerobic and resistive training based on initial evaluation findings, risk stratification, comorbidities and participant's personal goals.       Expected Outcomes  Achievement of increased cardiorespiratory fitness and enhanced flexibility, muscular endurance and strength shown through measurements of functional capacity and personal statement of participant.       Able to understand and use rate of perceived exertion (RPE) scale  Yes       Intervention  Provide education and explanation on how to use RPE scale       Expected Outcomes  Short Term: Able to use RPE daily in rehab to express subjective intensity level;Long Term:  Able to use RPE to guide intensity level when exercising independently       Able to understand and use Dyspnea scale  Yes       Intervention  Provide education and explanation on how to use Dyspnea scale       Expected Outcomes  Short Term: Able to use Dyspnea scale daily in rehab to express subjective sense of shortness of breath during exertion;Long Term: Able to use Dyspnea scale to guide intensity level when exercising independently       Knowledge and understanding of Target Heart Rate Range (THRR)  Yes       Intervention  Provide education and explanation of THRR including how the numbers were predicted and where they are located for reference       Expected Outcomes  Short Term: Able to state/look up THRR;Long Term: Able to use THRR to govern intensity when exercising independently;Short  Term: Able to use daily as guideline for intensity in rehab       Able to check pulse independently  Yes       Intervention  Provide education and demonstration on how to check pulse in carotid and radial arteries.;Review the importance of being able to check your own pulse for safety during independent exercise       Expected Outcomes  Short Term: Able to explain why pulse checking is important during independent exercise;Long Term: Able to check pulse independently and accurately       Understanding of Exercise Prescription  Yes       Intervention  Provide education, explanation, and written materials on patient's individual exercise prescription       Expected Outcomes  Short Term: Able to explain program  exercise prescription;Long Term: Able to explain home exercise prescription to exercise independently          Copy of goals given to participant.

## 2017-02-20 NOTE — Progress Notes (Signed)
Cardiac Individual Treatment Plan  Patient Details  Name: Jenna Young MRN: [id number] Date of Birth: May 28, 1931 Referring Provider:     Cardiac Rehab from 02/20/2017 in [facility] Cardiac and Pulmonary Rehab  Referring Provider  End      Initial Encounter Date:    Cardiac Rehab from 02/20/2017 in [facility] Cardiac and Pulmonary Rehab  Date  02/20/17  Referring Provider  End      Visit Diagnosis: NSTEMI (non-ST elevated myocardial infarction) [facility])  Patient's Home Medications on Admission:  Current Outpatient Medications:  .  aspirin (ASPIRIN EC) 81 MG EC tablet, Take 81 mg by mouth daily as needed for pain. Swallow whole. , Disp: , Rfl:  .  atorvastatin (LIPITOR) 40 MG tablet, Take 1 tablet (40 mg total) by mouth daily at 6 PM., Disp: 30 tablet, Rfl: 2 .  Bromfenac Sodium 0.07 % SOLN, Place 1 drop into the right eye at bedtime., Disp: , Rfl:  .  Cholecalciferol (VITAMIN D) 2000 UNITS CAPS, Take 1 capsule by mouth daily.  , Disp: , Rfl:  .  clopidogrel (PLAVIX) 75 MG tablet, Take 1 tablet (75 mg total) by mouth daily with breakfast., Disp: 30 tablet, Rfl: 2 .  cyanocobalamin (,VITAMIN B-12,) 1000 MCG/ML injection, Inject 1,000 mcg into the muscle every 3 (three) months., Disp: , Rfl:  .  Difluprednate 0.05 % EMUL, Place 1 drop into the right eye 3 (three) times daily., Disp: , Rfl:  .  isosorbide mononitrate (IMDUR) 30 MG 24 hr tablet, Take 30 mg by mouth daily., Disp: , Rfl:  .  losartan (COZAAR) 100 MG tablet, Take 0.5 tablets (50 mg total) by mouth daily., Disp: 30 tablet, Rfl: 0 .  nitroGLYCERIN (NITROSTAT) 0.4 MG SL tablet, Place 1 tablet (0.4 mg total) under the tongue every 5 (five) minutes x 3 doses as needed for chest pain., Disp: 30 tablet, Rfl: 2 .  omeprazole (PRILOSEC) 20 MG capsule, Take 20 mg by mouth daily., Disp: , Rfl:  .  Besifloxacin HCl 0.6 % SUSP, Place 1 drop into the right eye 3 (three) times daily., Disp: , Rfl:  .  calcium-vitamin D (OSCAL WITH D) 500-200 MG-UNIT  per tablet, Take 2 tablets by mouth daily.  , Disp: , Rfl:   Past Medical History: Past Medical History:  Diagnosis Date  . Allergic rhinitis   . Anemia   . Anxiety   . GERD (gastroesophageal reflux disease)   . History of radiation therapy 04/29/16- 05/24/16   Left Breast 40.05 Gy in 15 fractions, Left Breast boost 10 Gy in 5 fractions.   . Hyperlipidemia   . Hypertension    treated by Dr Thurnell Garbe recently with syncope episode  . Osteopenia   . Osteoporosis     Tobacco Use: Social History   Tobacco Use  Smoking Status Former Smoker  . Last attempt to quit: 03/11/1968  . Years since quitting: 48.9  Smokeless Tobacco Never Used    Labs: Recent Chemical engineer    Labs for ITP Cardiac and Pulmonary Rehab Latest Ref Rng & Units 01/27/2013 02/21/2014 03/17/2015 03/21/2016 02/11/2017   Cholestrol 0 - 200 mg/dL 207(H) 221(H) 200 205(H) 173   LDLCALC 0 - 99 mg/dL - 137(H) 115(H) 125(H) 100(H)   LDLDIRECT mg/dL 116.6 - - - -   HDL >40 mg/dL 59.80 48.60 51.50 56.50 53   Trlycerides <150 mg/dL 123.0 179.0(H) 167.0(H) 117.0 98   Hemoglobin A1c 4.6 - 6.1 % - - - - -   TCO2  0 - 100 mmol/L - - - - -       Exercise Target Goals: Date: 02/20/17  Exercise Program Goal: Individual exercise prescription set with THRR, safety & activity barriers. Participant demonstrates ability to understand and report RPE using BORG scale, to self-measure pulse accurately, and to acknowledge the importance of the exercise prescription.  Exercise Prescription Goal: Starting with aerobic activity 30 plus minutes a day, 3 days per week for initial exercise prescription. Provide home exercise prescription and guidelines that participant acknowledges understanding prior to discharge.  Activity Barriers & Risk Stratification: Activity Barriers & Cardiac Risk Stratification - 02/20/17 1424      Activity Barriers & Cardiac Risk Stratification   Activity Barriers  Back Problems History of compression  fracture.  Exercise helps.    Cardiac Risk Stratification  High       6 Minute Walk: 6 Minute Walk    Row Name 02/20/17 1445         6 Minute Walk   Distance  780 feet     Walk Time  6 minutes     # of Rest Breaks  0     MPH  1.5     METS  1.5     RPE  11     Perceived Dyspnea   0     VO2 Peak  5.2     Symptoms  No     Resting HR  79 bpm     Resting BP  136/58     Resting Oxygen Saturation   8 %     Exercise Oxygen Saturation  during 6 min walk  100 %     Max Ex. HR  102 bpm     Max Ex. BP  148/50     2 Minute Post BP  130/52        Oxygen Initial Assessment:   Oxygen Re-Evaluation:   Oxygen Discharge (Final Oxygen Re-Evaluation):   Initial Exercise Prescription: Initial Exercise Prescription - 02/20/17 1400      Date of Initial Exercise RX and Referring Provider   Date  02/20/17    Referring Provider  End      Treadmill   MPH  1    Grade  0    Minutes  15    METs  1.77      NuStep   Level  1    SPM  80    Minutes  15    METs  1.5      Biostep-RELP   Level  1    SPM  50    Minutes  15    METs  1.5      Prescription Details   Frequency (times per week)  3    Duration  Progress to 45 minutes of aerobic exercise without signs/symptoms of physical distress      Intensity   THRR 40-80% of Max Heartrate  102-125    Ratings of Perceived Exertion  11-13    Perceived Dyspnea  0-4      Resistance Training   Training Prescription  Yes    Weight  2 lb    Reps  10-15       Perform Capillary Blood Glucose checks as needed.  Exercise Prescription Changes: Exercise Prescription Changes    Row Name 02/20/17 1400             Response to Exercise   Blood Pressure (Admit)  135/58  Blood Pressure (Exercise)  148/50       Blood Pressure (Exit)  130/52       Heart Rate (Admit)  79 bpm       Heart Rate (Exercise)  102 bpm       Heart Rate (Exit)  73 bpm       Oxygen Saturation (Admit)  98 %       Oxygen Saturation (Exit)  100 %        Rating of Perceived Exertion (Exercise)  11          Exercise Comments:   Exercise Goals and Review: Exercise Goals    Row Name 02/20/17 1444             Exercise Goals   Increase Physical Activity  Yes       Intervention  Provide advice, education, support and counseling about physical activity/exercise needs.;Develop an individualized exercise prescription for aerobic and resistive training based on initial evaluation findings, risk stratification, comorbidities and participant's personal goals.       Expected Outcomes  Achievement of increased cardiorespiratory fitness and enhanced flexibility, muscular endurance and strength shown through measurements of functional capacity and personal statement of participant.       Increase Strength and Stamina  Yes       Intervention  Provide advice, education, support and counseling about physical activity/exercise needs.;Develop an individualized exercise prescription for aerobic and resistive training based on initial evaluation findings, risk stratification, comorbidities and participant's personal goals.       Expected Outcomes  Achievement of increased cardiorespiratory fitness and enhanced flexibility, muscular endurance and strength shown through measurements of functional capacity and personal statement of participant.       Able to understand and use rate of perceived exertion (RPE) scale  Yes       Intervention  Provide education and explanation on how to use RPE scale       Expected Outcomes  Short Term: Able to use RPE daily in rehab to express subjective intensity level;Long Term:  Able to use RPE to guide intensity level when exercising independently       Able to understand and use Dyspnea scale  Yes       Intervention  Provide education and explanation on how to use Dyspnea scale       Expected Outcomes  Short Term: Able to use Dyspnea scale daily in rehab to express subjective sense of shortness of breath during exertion;Long  Term: Able to use Dyspnea scale to guide intensity level when exercising independently       Knowledge and understanding of Target Heart Rate Range (THRR)  Yes       Intervention  Provide education and explanation of THRR including how the numbers were predicted and where they are located for reference       Expected Outcomes  Short Term: Able to state/look up THRR;Long Term: Able to use THRR to govern intensity when exercising independently;Short Term: Able to use daily as guideline for intensity in rehab       Able to check pulse independently  Yes       Intervention  Provide education and demonstration on how to check pulse in carotid and radial arteries.;Review the importance of being able to check your own pulse for safety during independent exercise       Expected Outcomes  Short Term: Able to explain why pulse checking is important during independent exercise;Long Term: Able to check pulse independently  and accurately       Understanding of Exercise Prescription  Yes       Intervention  Provide education, explanation, and written materials on patient's individual exercise prescription       Expected Outcomes  Short Term: Able to explain program exercise prescription;Long Term: Able to explain home exercise prescription to exercise independently          Exercise Goals Re-Evaluation :   Discharge Exercise Prescription (Final Exercise Prescription Changes): Exercise Prescription Changes - 02/20/17 1400      Response to Exercise   Blood Pressure (Admit)  135/58    Blood Pressure (Exercise)  148/50    Blood Pressure (Exit)  130/52    Heart Rate (Admit)  79 bpm    Heart Rate (Exercise)  102 bpm    Heart Rate (Exit)  73 bpm    Oxygen Saturation (Admit)  98 %    Oxygen Saturation (Exit)  100 %    Rating of Perceived Exertion (Exercise)  11       Nutrition:  Target Goals: Understanding of nutrition guidelines, daily intake of sodium 1500mg , cholesterol 200mg , calories 30% from fat  and 7% or less from saturated fats, daily to have 5 or more servings of fruits and vegetables.  Biometrics: Pre Biometrics - 02/20/17 1444      Pre Biometrics   Height  5' 2.5" (1.588 m)    Weight  138 lb 1.6 oz (62.6 kg)    Waist Circumference  32.5 inches    Hip Circumference  39 inches    Waist to Hip Ratio  0.83 %    BMI (Calculated)  24.84    Single Leg Stand  2.45 seconds        Nutrition Therapy Plan and Nutrition Goals: Nutrition Therapy & Goals - 02/20/17 1426      Intervention Plan   Intervention  Prescribe, educate and counsel regarding individualized specific dietary modifications aiming towards targeted core components such as weight, hypertension, lipid management, diabetes, heart failure and other comorbidities.    Expected Outcomes  Short Term Goal: Understand basic principles of dietary content, such as calories, fat, sodium, cholesterol and nutrients.;Short Term Goal: A plan has been developed with personal nutrition goals set during dietitian appointment.;Long Term Goal: Adherence to prescribed nutrition plan.       Nutrition Discharge: Rate Your Plate Scores: Nutrition Assessments - 02/20/17 1426      MEDFICTS Scores   Pre Score  24       Nutrition Goals Re-Evaluation:   Nutrition Goals Discharge (Final Nutrition Goals Re-Evaluation):   Psychosocial: Target Goals: Acknowledge presence or absence of significant depression and/or stress, maximize coping skills, provide positive support system. Participant is able to verbalize types and ability to use techniques and skills needed for reducing stress and depression.   Initial Review & Psychosocial Screening: Initial Psych Review & Screening - 02/20/17 1427      Initial Review   Current issues with  None Identified      Family Dynamics   Good Support System?  Yes Daughter lives in [place], son      Barriers   Psychosocial barriers to participate in program  There are no identifiable barriers or  psychosocial needs.;The patient should benefit from training in stress management and relaxation.      Screening Interventions   Interventions  Yes;To provide support and resources with identified psychosocial needs;Provide feedback about the scores to participant;Encouraged to exercise    Expected Outcomes  Short Term goal: Utilizing psychosocial counselor, staff and physician to assist with identification of specific Stressors or current issues interfering with healing process. Setting desired goal for each stressor or current issue identified.;Long Term Goal: Stressors or current issues are controlled or eliminated.;Short Term goal: Identification and review with participant of any Quality of Life or Depression concerns found by scoring the questionnaire.;Long Term goal: The participant improves quality of Life and PHQ9 Scores as seen by post scores and/or verbalization of changes       Quality of Life Scores:  Quality of Life - 02/20/17 1429      Quality of Life Scores   Health/Function Pre  28.5 %    Socioeconomic Pre  30 %    Psych/Spiritual Pre  29.14 %    Family Pre  30 %    GLOBAL Pre  29.17 %       PHQ-9: Recent Review Flowsheet Data    Depression screen [facility] 2/9 02/20/2017 06/28/2016 04/17/2016 03/21/2016 03/27/2015   Decreased Interest 0 0 0 0 0   Down, Depressed, Hopeless 0 0 0 0 0   PHQ - 2 Score 0 0 0 0 0   Altered sleeping 0 - - - -   Tired, decreased energy 1 - - - -   Change in appetite 0 - - - -   Feeling bad or failure about yourself  0 - - - -   Trouble concentrating 0 - - - -   Moving slowly or fidgety/restless 0 - - - -   Suicidal thoughts 0 - - - -   PHQ-9 Score 1 - - - -   Difficult doing work/chores Not difficult at all - - - -     Interpretation of Total Score  Total Score Depression Severity:  1-4 = Minimal depression, 5-9 = Mild depression, 10-14 = Moderate depression, 15-19 = Moderately severe depression, 20-27 = Severe depression   Psychosocial  Evaluation and Intervention:   Psychosocial Re-Evaluation:   Psychosocial Discharge (Final Psychosocial Re-Evaluation):   Vocational Rehabilitation: Provide vocational rehab assistance to qualifying candidates.   Vocational Rehab Evaluation & Intervention: Vocational Rehab - 02/20/17 1434      Initial Vocational Rehab Evaluation & Intervention   Assessment shows need for Vocational Rehabilitation  No       Education: Education Goals: Education classes will be provided on a variety of topics geared toward better understanding of heart health and risk factor modification. Participant will state understanding/return demonstration of topics presented as noted by education test scores.  Learning Barriers/Preferences: Learning Barriers/Preferences - 02/20/17 1433      Learning Barriers/Preferences   Learning Barriers  Hearing    Learning Preferences  None       Education Topics: General Nutrition Guidelines/Fats and Fiber: -Group instruction provided by verbal, written material, models and posters to present the general guidelines for heart healthy nutrition. Gives an explanation and review of dietary fats and fiber.   Controlling Sodium/Reading Food Labels: -Group verbal and written material supporting the discussion of sodium use in heart healthy nutrition. Review and explanation with models, verbal and written materials for utilization of the food label.   Exercise Physiology & Risk Factors: - Group verbal and written instruction with models to review the exercise physiology of the cardiovascular system and associated critical values. Details cardiovascular disease risk factors and the goals associated with each risk factor.   Aerobic Exercise & Resistance Training: - Gives group verbal and written discussion on the health  impact of inactivity. On the components of aerobic and resistive training programs and the benefits of this training and how to safely progress through  these programs.   Flexibility, Balance, General Exercise Guidelines: - Provides group verbal and written instruction on the benefits of flexibility and balance training programs. Provides general exercise guidelines with specific guidelines to those with heart or lung disease. Demonstration and skill practice provided.   Stress Management: - Provides group verbal and written instruction about the health risks of elevated stress, cause of high stress, and healthy ways to reduce stress.   Depression: - Provides group verbal and written instruction on the correlation between heart/lung disease and depressed mood, treatment options, and the stigmas associated with seeking treatment.   Anatomy & Physiology of the Heart: - Group verbal and written instruction and models provide basic cardiac anatomy and physiology, with the coronary electrical and arterial systems. Review of: AMI, Angina, Valve disease, Heart Failure, Cardiac Arrhythmia, Pacemakers, and the ICD.   Cardiac Procedures: - Group verbal and written instruction to review commonly prescribed medications for heart disease. Reviews the medication, class of the drug, and side effects. Includes the steps to properly store meds and maintain the prescription regimen. (beta blockers and nitrates)   Cardiac Medications I: - Group verbal and written instruction to review commonly prescribed medications for heart disease. Reviews the medication, class of the drug, and side effects. Includes the steps to properly store meds and maintain the prescription regimen.   Cardiac Medications II: -Group verbal and written instruction to review commonly prescribed medications for heart disease. Reviews the medication, class of the drug, and side effects. (all other drug classes)    Go Sex-Intimacy & Heart Disease, Get SMART - Goal Setting: - Group verbal and written instruction through game format to discuss heart disease and the return to sexual  intimacy. Provides group verbal and written material to discuss and apply goal setting through the application of the S.M.A.R.T. Method.   Other Matters of the Heart: - Provides group verbal, written materials and models to describe Heart Failure, Angina, Valve Disease, Peripheral Artery Disease, and Diabetes in the realm of heart disease. Includes description of the disease process and treatment options available to the cardiac patient.   Exercise & Equipment Safety: - Individual verbal instruction and demonstration of equipment use and safety with use of the equipment.   Cardiac Rehab from 02/20/2017 in [facility] Cardiac and Pulmonary Rehab  Date  02/20/17  Educator  [facility]  Instruction Review Code  1- Verbalizes Understanding      Infection Prevention: - Provides verbal and written material to individual with discussion of infection control including proper hand washing and proper equipment cleaning during exercise session.   Cardiac Rehab from 02/20/2017 in [facility] Cardiac and Pulmonary Rehab  Date  02/20/17  Educator  [facility]  Instruction Review Code  1- Verbalizes Understanding      Falls Prevention: - Provides verbal and written material to individual with discussion of falls prevention and safety.   Cardiac Rehab from 02/20/2017 in [facility] Cardiac and Pulmonary Rehab  Date  02/20/17  Educator  [facility]  Instruction Review Code  1- Verbalizes Understanding      Diabetes: - Individual verbal and written instruction to review signs/symptoms of diabetes, desired ranges of glucose level fasting, after meals and with exercise. Acknowledge that pre and post exercise glucose checks will be done for 3 sessions at entry of program.   Other: -Provides group and verbal instruction on various topics (  see comments)    Knowledge Questionnaire Score: Knowledge Questionnaire Score - 02/20/17 1433      Knowledge Questionnaire Score   Pre Score  20/28 Correct responses reviewed with Inez Catalina. She verbalized  understanding of the responses and had no questions today.       Core Components/Risk Factors/Patient Goals at Admission: Personal Goals and Risk Factors at Admission - 02/20/17 1426      Core Components/Risk Factors/Patient Goals on Admission   Hypertension  Yes    Intervention  Provide education on lifestyle modifcations including regular physical activity/exercise, weight management, moderate sodium restriction and increased consumption of fresh fruit, vegetables, and low fat dairy, alcohol moderation, and smoking cessation.;Monitor prescription use compliance.    Expected Outcomes  Short Term: Continued assessment and intervention until BP is < 140/37mm HG in hypertensive participants. < 130/47mm HG in hypertensive participants with diabetes, heart failure or chronic kidney disease.;Long Term: Maintenance of blood pressure at goal levels.    Lipids  Yes    Intervention  Provide education and support for participant on nutrition & aerobic/resistive exercise along with prescribed medications to achieve LDL 70mg , HDL >40mg .    Expected Outcomes  Short Term: Participant states understanding of desired cholesterol values and is compliant with medications prescribed. Participant is following exercise prescription and nutrition guidelines.;Long Term: Cholesterol controlled with medications as prescribed, with individualized exercise RX and with personalized nutrition plan. Value goals: LDL < 70mg , HDL > 40 mg.       Core Components/Risk Factors/Patient Goals Review:    Core Components/Risk Factors/Patient Goals at Discharge (Final Review):    ITP Comments: ITP Comments    Row Name 02/20/17 1417           ITP Comments  Medical review completed today. ITP sent to medical director for review, changes as needed and signature.  Documentation of the diagnosis  can be found in [facility] ADmission 02/10/2017          Comments:

## 2017-02-20 NOTE — Progress Notes (Signed)
Daily Session Note  Patient Details  Name: Jenna Young MRN: [id number] Date of Birth: June 18, 1931 Referring Provider:     Cardiac Rehab from 02/20/2017 in [facility] Cardiac and Pulmonary Rehab  Referring Provider  End      Encounter Date: 02/20/2017  Check In: Session Check In - 02/20/17 1416      Check-In   Location  [facility]-Cardiac & Pulmonary Rehab    Staff Present  Renita Papa, RN Vickki Hearing, BA, ACSM CEP, Exercise Physiologist;Hyun Marsalis, RN, BSN, CCRP    Supervising physician immediately available to respond to emergencies  See telemetry face sheet for immediately available ER MD    Medication changes reported      No    Fall or balance concerns reported     No    Warm-up and Cool-down  Performed as group-led instruction    Resistance Training Performed  Yes    VAD Patient?  No      Pain Assessment   Currently in Pain?  No/denies        Exercise Prescription Changes - 02/20/17 1400      Response to Exercise   Blood Pressure (Admit)  135/58    Blood Pressure (Exercise)  148/50    Blood Pressure (Exit)  130/52    Heart Rate (Admit)  79 bpm    Heart Rate (Exercise)  102 bpm    Heart Rate (Exit)  73 bpm    Oxygen Saturation (Admit)  98 %    Oxygen Saturation (Exit)  100 %    Rating of Perceived Exertion (Exercise)  11       Social History   Tobacco Use  Smoking Status Former Smoker  . Last attempt to quit: 03/11/1968  . Years since quitting: 48.9  Smokeless Tobacco Never Used    Goals Met:  Personal goals reviewed No report of cardiac concerns or symptoms Strength training completed today  Goals Unmet:  Not Applicable  Comments: medical review completed today   Dr. Emily Filbert is Medical Director for [place] and [facility].

## 2017-02-20 NOTE — Telephone Encounter (Signed)
Patient contacted regarding discharge from [facility] on 02/13/17.   Patient understands to follow up with provider ? On 02/21/17 at 2:20pm at [facility] with Dr Rockey Situ.  Patient understands discharge instructions? Yes  Patient understands medications and regiment? Yes  Patient understands to bring all medications to this visit? Yes

## 2017-02-20 NOTE — Telephone Encounter (Signed)
Left VM requesting pt's daughter Lelon Frohlich to call office back (CRM Created), please advise of Dr. Marliss Coots comments and ask questions in message below if daughter calls back

## 2017-02-20 NOTE — Telephone Encounter (Signed)
Ann's work phone number: [phone]

## 2017-02-21 ENCOUNTER — Encounter: Payer: Self-pay | Admitting: Cardiovascular Disease

## 2017-02-21 ENCOUNTER — Ambulatory Visit (INDEPENDENT_AMBULATORY_CARE_PROVIDER_SITE_OTHER): Payer: Medicare Other | Admitting: Cardiovascular Disease

## 2017-02-21 VITALS — BP 140/60 | HR 80 | Ht 62.0 in | Wt 139.0 lb

## 2017-02-21 DIAGNOSIS — E782 Mixed hyperlipidemia: Secondary | ICD-10-CM | POA: Diagnosis not present

## 2017-02-21 DIAGNOSIS — I10 Essential (primary) hypertension: Secondary | ICD-10-CM

## 2017-02-21 DIAGNOSIS — I25118 Atherosclerotic heart disease of native coronary artery with other forms of angina pectoris: Secondary | ICD-10-CM | POA: Diagnosis not present

## 2017-02-21 DIAGNOSIS — I214 Non-ST elevation (NSTEMI) myocardial infarction: Secondary | ICD-10-CM

## 2017-02-21 DIAGNOSIS — I2510 Atherosclerotic heart disease of native coronary artery without angina pectoris: Secondary | ICD-10-CM | POA: Insufficient documentation

## 2017-02-21 MED ORDER — METOPROLOL SUCCINATE ER 25 MG PO TB24: 25.0000 mg | ORAL_TABLET | Freq: Every day | ORAL | 3 refills | Status: DC

## 2017-02-21 NOTE — Telephone Encounter (Signed)
Ok-thanks for letting me know and I will see her on the 24th as planned (I think that is when her f/u is)

## 2017-02-21 NOTE — Patient Instructions (Addendum)
Medication Instructions:   Please start metoprolol one a  day  Labwork:  No new labs needed  Testing/Procedures:  No further testing at this time   Follow-Up: It was a pleasure seeing you in the office today. Please call us if you have new issues that need to be addressed before your next appt.  [phone]  Your physician wants you to follow-up in: 6 months.  You will receive a reminder letter in the mail two months in advance. If you don't receive a letter, please call our office to schedule the follow-up appointment.  If you need a refill on your cardiac medications before your next appointment, please call your pharmacy.

## 2017-02-27 ENCOUNTER — Telehealth: Payer: Self-pay | Admitting: Family Medicine

## 2017-02-27 MED ORDER — OMEPRAZOLE 20 MG PO CPDR: 20.0000 mg | DELAYED_RELEASE_CAPSULE | Freq: Every day | ORAL | 5 refills | Status: DC

## 2017-02-27 NOTE — Addendum Note (Signed)
Addended by: Tammi Sou on: 02/27/2017 03:59 PM   Modules accepted: Orders

## 2017-02-27 NOTE — Telephone Encounter (Signed)
Med filled.  

## 2017-02-27 NOTE — Telephone Encounter (Signed)
LR: 09/27/16 Upcoming OV:03/03/17 30 day refill Has drug interaction with clopidogrel

## 2017-02-27 NOTE — Telephone Encounter (Signed)
Aware-please go ahead and fill it  She saw cardiology recently and they are aware she is on it (was on med list) -thanks

## 2017-02-27 NOTE — Telephone Encounter (Signed)
LR: 09/27/16 Upcoming OV: 03/03/17  Has interaction with Plavix. No active order sent back to provider

## 2017-02-27 NOTE — Telephone Encounter (Signed)
Copied from [place]. Topic: Quick Communication - Rx Refill/Question >> Feb 27, 2017 10:33 AM Arletha Grippe wrote: Has the patient contacted their pharmacy? Yes.     (Agent: If no, request that the patient contact the pharmacy for the refill.)   Preferred Pharmacy (with phone number or street name):[facility] called, wanting refill for omeprazole (PRILOSEC) 20 MG capsule cb is [phone]   Agent: Please be advised that RX refills may take up to 3 business days. We ask that you follow-up with your pharmacy.

## 2017-02-28 ENCOUNTER — Emergency Department (HOSPITAL_COMMUNITY): Payer: Medicare Other

## 2017-02-28 ENCOUNTER — Ambulatory Visit: Payer: Self-pay

## 2017-02-28 ENCOUNTER — Inpatient Hospital Stay (HOSPITAL_COMMUNITY)
Admission: EM | Admit: 2017-02-28 | Discharge: 2017-03-02 | DRG: 282 | Disposition: A | Discharge status: Home / Self Care | Payer: Medicare Other | Attending: Internal Medicine | Admitting: Internal Medicine

## 2017-02-28 ENCOUNTER — Encounter (HOSPITAL_COMMUNITY): Payer: Self-pay | Admitting: Emergency Medicine

## 2017-02-28 ENCOUNTER — Other Ambulatory Visit: Payer: Self-pay

## 2017-02-28 DIAGNOSIS — E538 Deficiency of other specified B group vitamins: Secondary | ICD-10-CM | POA: Diagnosis present

## 2017-02-28 DIAGNOSIS — F419 Anxiety disorder, unspecified: Secondary | ICD-10-CM | POA: Diagnosis present

## 2017-02-28 DIAGNOSIS — I214 Non-ST elevation (NSTEMI) myocardial infarction: Secondary | ICD-10-CM | POA: Diagnosis present

## 2017-02-28 DIAGNOSIS — Z885 Allergy status to narcotic agent status: Secondary | ICD-10-CM

## 2017-02-28 DIAGNOSIS — I2510 Atherosclerotic heart disease of native coronary artery without angina pectoris: Secondary | ICD-10-CM | POA: Diagnosis not present

## 2017-02-28 DIAGNOSIS — H8110 Benign paroxysmal vertigo, unspecified ear: Secondary | ICD-10-CM | POA: Diagnosis not present

## 2017-02-28 DIAGNOSIS — Z955 Presence of coronary angioplasty implant and graft: Secondary | ICD-10-CM

## 2017-02-28 DIAGNOSIS — H8113 Benign paroxysmal vertigo, bilateral: Secondary | ICD-10-CM

## 2017-02-28 DIAGNOSIS — R55 Syncope and collapse: Principal | ICD-10-CM | POA: Diagnosis present

## 2017-02-28 DIAGNOSIS — I10 Essential (primary) hypertension: Secondary | ICD-10-CM | POA: Diagnosis present

## 2017-02-28 DIAGNOSIS — M810 Age-related osteoporosis without current pathological fracture: Secondary | ICD-10-CM | POA: Diagnosis present

## 2017-02-28 DIAGNOSIS — E785 Hyperlipidemia, unspecified: Secondary | ICD-10-CM | POA: Diagnosis present

## 2017-02-28 DIAGNOSIS — Z87891 Personal history of nicotine dependence: Secondary | ICD-10-CM

## 2017-02-28 DIAGNOSIS — E559 Vitamin D deficiency, unspecified: Secondary | ICD-10-CM | POA: Diagnosis present

## 2017-02-28 DIAGNOSIS — Z880 Allergy status to penicillin: Secondary | ICD-10-CM

## 2017-02-28 DIAGNOSIS — F319 Bipolar disorder, unspecified: Secondary | ICD-10-CM | POA: Diagnosis present

## 2017-02-28 DIAGNOSIS — Z923 Personal history of irradiation: Secondary | ICD-10-CM

## 2017-02-28 DIAGNOSIS — E782 Mixed hyperlipidemia: Secondary | ICD-10-CM

## 2017-02-28 DIAGNOSIS — Z87898 Personal history of other specified conditions: Secondary | ICD-10-CM | POA: Diagnosis present

## 2017-02-28 DIAGNOSIS — I739 Peripheral vascular disease, unspecified: Secondary | ICD-10-CM | POA: Diagnosis present

## 2017-02-28 DIAGNOSIS — Z853 Personal history of malignant neoplasm of breast: Secondary | ICD-10-CM

## 2017-02-28 DIAGNOSIS — Z79899 Other long term (current) drug therapy: Secondary | ICD-10-CM

## 2017-02-28 DIAGNOSIS — Z7982 Long term (current) use of aspirin: Secondary | ICD-10-CM

## 2017-02-28 DIAGNOSIS — Z888 Allergy status to other drugs, medicaments and biological substances status: Secondary | ICD-10-CM

## 2017-02-28 DIAGNOSIS — Z882 Allergy status to sulfonamides status: Secondary | ICD-10-CM

## 2017-02-28 DIAGNOSIS — Z7902 Long term (current) use of antithrombotics/antiplatelets: Secondary | ICD-10-CM

## 2017-02-28 DIAGNOSIS — K219 Gastro-esophageal reflux disease without esophagitis: Secondary | ICD-10-CM | POA: Diagnosis present

## 2017-02-28 DIAGNOSIS — I25118 Atherosclerotic heart disease of native coronary artery with other forms of angina pectoris: Secondary | ICD-10-CM | POA: Diagnosis present

## 2017-02-28 LAB — BASIC METABOLIC PANEL
Anion gap: 10 (ref 5–15)
BUN: 9 mg/dL (ref 6–20)
CO2: 23 mmol/L (ref 22–32)
Calcium: 9 mg/dL (ref 8.9–10.3)
Chloride: 98 mmol/L — ABNORMAL LOW (ref 101–111)
Creatinine, Ser: 0.94 mg/dL (ref 0.44–1.00)
GFR calc Af Amer: >60 mL/min (ref >60)
GFR calc non Af Amer: 55 mL/min — ABNORMAL LOW (ref >60)
Glucose, Bld: 118 mg/dL — ABNORMAL HIGH (ref 65–99)
Potassium: 4.1 mmol/L (ref 3.5–5.1)
Sodium: 131 mmol/L — ABNORMAL LOW (ref 135–145)

## 2017-02-28 LAB — CBC
HCT: 31.8 % — ABNORMAL LOW (ref 36.0–46.0)
Hemoglobin: 10.2 g/dL — ABNORMAL LOW (ref 12.0–15.0)
MCH: 26.5 pg (ref 26.0–34.0)
MCHC: 32.1 g/dL (ref 30.0–36.0)
MCV: 82.6 fL (ref 78.0–100.0)
Platelets: 273 10*3/uL (ref 150–400)
RBC: 3.85 MIL/uL — ABNORMAL LOW (ref 3.87–5.11)
RDW: 16.1 % — ABNORMAL HIGH (ref 11.5–15.5)
WBC: 5.7 10*3/uL (ref 4.0–10.5)

## 2017-02-28 LAB — I-STAT TROPONIN, ED: Troponin i, poc: 0 ng/mL (ref 0.00–0.08)

## 2017-02-28 LAB — URINALYSIS, ROUTINE W REFLEX MICROSCOPIC
Bilirubin Urine: NEGATIVE
Glucose, UA: NEGATIVE mg/dL
Hgb urine dipstick: NEGATIVE
Ketones, ur: NEGATIVE mg/dL
Nitrite: NEGATIVE
Protein, ur: NEGATIVE mg/dL
Specific Gravity, Urine: 1.004 — ABNORMAL LOW (ref 1.005–1.030)
pH: 7 (ref 5.0–8.0)

## 2017-02-28 LAB — CBG MONITORING, ED
Glucose-Capillary: 117 mg/dL — ABNORMAL HIGH (ref 65–99)
Glucose-Capillary: 97 mg/dL (ref 65–99)

## 2017-02-28 MED ORDER — ATORVASTATIN CALCIUM 40 MG PO TABS
40.0000 mg | ORAL_TABLET | Freq: Every day | ORAL | Status: DC
  Administered 2017-03-01 – 2017-03-02 (×2): 40 mg via ORAL
  Filled 2017-02-28 (×2): qty 1

## 2017-02-28 MED ORDER — SODIUM CHLORIDE 0.9% FLUSH: 3.0000 mL | INTRAVENOUS | Status: DC | PRN

## 2017-02-28 MED ORDER — ISOSORBIDE MONONITRATE ER 30 MG PO TB24
30.0000 mg | ORAL_TABLET | Freq: Every day | ORAL | Status: DC
  Administered 2017-03-01: 30 mg via ORAL
  Filled 2017-02-28: qty 1

## 2017-02-28 MED ORDER — ASPIRIN EC 81 MG PO TBEC
81.0000 mg | DELAYED_RELEASE_TABLET | Freq: Every day | ORAL | Status: DC
  Administered 2017-03-01 – 2017-03-02 (×2): 81 mg via ORAL
  Filled 2017-02-28 (×2): qty 1

## 2017-02-28 MED ORDER — CLOPIDOGREL BISULFATE 75 MG PO TABS
75.0000 mg | ORAL_TABLET | Freq: Every day | ORAL | Status: DC
  Administered 2017-03-01 – 2017-03-02 (×2): 75 mg via ORAL
  Filled 2017-02-28 (×2): qty 1

## 2017-02-28 MED ORDER — LOSARTAN POTASSIUM 50 MG PO TABS
50.0000 mg | ORAL_TABLET | Freq: Every day | ORAL | Status: DC
  Administered 2017-03-01: 50 mg via ORAL
  Filled 2017-02-28: qty 1

## 2017-02-28 MED ORDER — SODIUM CHLORIDE 0.9 % IV SOLN: 250.0000 mL | INTRAVENOUS | Status: DC | PRN

## 2017-02-28 MED ORDER — SODIUM CHLORIDE 0.9% FLUSH
3.0000 mL | Freq: Two times a day (BID) | INTRAVENOUS | Status: DC
  Administered 2017-02-28 – 2017-03-02 (×4): 3 mL via INTRAVENOUS

## 2017-02-28 MED ORDER — METOPROLOL SUCCINATE ER 25 MG PO TB24
25.0000 mg | ORAL_TABLET | Freq: Every day | ORAL | Status: DC
  Administered 2017-03-01 – 2017-03-02 (×2): 25 mg via ORAL
  Filled 2017-02-28 (×2): qty 1

## 2017-02-28 NOTE — ED Notes (Signed)
Patient transported to CT 

## 2017-02-28 NOTE — ED Notes (Signed)
ED Provider at bedside. 

## 2017-02-28 NOTE — H&P (Signed)
History and Physical    RYE DORADO HGD:[id number] DOB: 1931/11/19 DOA: 02/28/2017  PCP: Abner Greenspan, MD  Patient coming from:  Home, actually [employer]  Chief Complaint:  Passed out  HPI: Jenna Young is a 83 y.o. female with medical history significant of syncope in past with neg holter monitor, recent NSTEMI with CAD unamendable to PCI at [facility] 02/13/17, HTN, HLD comes in after having a syncopal episode at [employer] today.  She reports she has had these passing out spells before and has had no explanation for them.  This was similar to previous spells.  She had vision darkness and sweatiness then passed out.  No prodrome of dizziness, chest pain, palpitations, or weakness.  Just dark veil to vision then suddenly passes out.  She came too quickly and was back to normal quickly.  No seizure like activity.  No sob.  Feels fine now.  Never been told she is orthostatic.  This episode occurrred while she had been walking for a while and was shopping.  Last episode was back in May.  Pt referrred for admission for syncope in the setting of recent NSTEMI.  Review of Systems: As per HPI otherwise 10 point review of systems negative.   Past Medical History:  Diagnosis Date  . Allergic rhinitis   . Anemia   . Anxiety   . GERD (gastroesophageal reflux disease)   . History of radiation therapy 04/29/16- 05/24/16   Left Breast 40.05 Gy in 15 fractions, Left Breast boost 10 Gy in 5 fractions.   . Hyperlipidemia   . Hypertension    treated by Dr Thurnell Garbe recently with syncope episode  . Osteopenia   . Osteoporosis     Past Surgical History:  Procedure Laterality Date  . APPENDECTOMY    . BREAST LUMPECTOMY WITH RADIOACTIVE SEED LOCALIZATION Left 02/27/2016   Procedure: LEFT BREAST LUMPECTOMY WITH RADIOACTIVE SEED LOCALIZATION;  Surgeon: Autumn Messing III, MD;  Location: [place];  Service: General;  Laterality: Left;  . BREAST SURGERY  10/10   benign  biopsy  negative  . CESAREAN SECTION    . CHOLECYSTECTOMY    . CORONARY STENT INTERVENTION N/A 02/11/2017   Procedure: CORONARY STENT INTERVENTION;  Surgeon: Nelva Bush, MD;  Location: [place] [facility];  Service: Cardiovascular;  Laterality: N/A;  . FEMUR FRACTURE SURGERY Right 2006  . LEFT HEART CATH AND CORONARY ANGIOGRAPHY N/A 02/11/2017   Procedure: LEFT HEART CATH AND CORONARY ANGIOGRAPHY;  Surgeon: Nelva Bush, MD;  Location: [place] [facility];  Service: Cardiovascular;  Laterality: N/A;  . OVARIAN CYST SURGERY    . RE-EXCISION OF BREAST LUMPECTOMY Left 04/01/2016   Procedure: RE-EXCISION OF LEFT BREAST MEDIAL MARGIN;  Surgeon: Autumn Messing III, MD;  Location: [place];  Service: General;  Laterality: Left;     reports that she quit smoking about 49 years ago. she has never used smokeless tobacco. She reports that she does not drink alcohol or use drugs.  Allergies  Allergen Reactions  . Buspirone Hcl     REACTION: HEART PALPITATIONS  . Codeine     REACTION: nausea and vomiting  . Hydrochlorothiazide     REACTION: syncope-possibly from dehydration  . Lansoprazole     REACTION: abd pain  . Minocycline Hcl   . Penicillins     REACTION: mouth numbness Has patient had a PCN reaction causing immediate rash, facial/tongue/throat swelling, SOB or lightheadedness with hypotension: No  Has patient had a PCN reaction causing severe rash involving mucus membranes or skin necrosis: No Has patient had a PCN reaction that required hospitalization: No Has patient had a PCN reaction occurring within the last 10 years: No If all of the above answers are "NO", then may proceed with Cephalosporin use.  Marland Kitchen Phenergan [Promethazine Hcl] Other (See Comments)    Body shakes, hallucinations  . Ramipril     Cough   . Sulfa Antibiotics Nausea Only    Family History  Problem Relation Age of Onset  . Hypertension Mother   . Heart disease Father   . Macular  degeneration Sister   . Diabetes Maternal Aunt   . Cancer Maternal Aunt        throat    Prior to Admission medications   Medication Sig Start Date End Date Taking? Authorizing Provider  aspirin (ASPIRIN EC) 81 MG EC tablet Take 81 mg by mouth daily. Swallow whole.    Yes [provider]  atorvastatin (LIPITOR) 40 MG tablet Take 1 tablet (40 mg total) by mouth daily at 6 PM. 02/13/17  Yes Demetrios Loll, MD  Bromfenac Sodium 0.07 % SOLN Place 1 drop into the right eye at bedtime.   Yes [provider]  Cholecalciferol (VITAMIN D) 2000 UNITS CAPS Take 1 capsule by mouth daily.     Yes [provider]  clopidogrel (PLAVIX) 75 MG tablet Take 1 tablet (75 mg total) by mouth daily with breakfast. 02/13/17  Yes Demetrios Loll, MD  cyanocobalamin (,VITAMIN B-12,) 1000 MCG/ML injection Inject 1,000 mcg into the muscle every 3 (three) months.   Yes [provider]  isosorbide mononitrate (IMDUR) 30 MG 24 hr tablet Take 30 mg by mouth daily.   Yes [provider]  losartan (COZAAR) 100 MG tablet Take 0.5 tablets (50 mg total) by mouth daily. 02/13/17  Yes Demetrios Loll, MD  metoprolol succinate (TOPROL-XL) 25 MG 24 hr tablet Take 1 tablet (25 mg total) by mouth daily. Take with or immediately following a meal. 02/21/17  Yes Gollan, Kathlene November, MD  nitroGLYCERIN (NITROSTAT) 0.4 MG SL tablet Place 1 tablet (0.4 mg total) under the tongue every 5 (five) minutes x 3 doses as needed for chest pain. 02/13/17  Yes Demetrios Loll, MD  omeprazole (PRILOSEC) 20 MG capsule Take 1 capsule (20 mg total) by mouth daily. Patient taking differently: Take 20 mg by mouth daily as needed (acid reflux).  02/27/17  Yes Tower, [name], MD    Physical Exam: Vitals:   02/28/17 1945 02/28/17 2000 02/28/17 2015 02/28/17 2030  BP: (!) 126/53 (!) 123/51 (!) 142/55 (!) 143/70  Pulse: 72 78 78 92  Resp: 14 14 16 14   Temp:      TempSrc:      SpO2: 97% 96% 98% 96%  Weight:      Height:           Constitutional: NAD, calm, comfortable Vitals:   02/28/17 1945 02/28/17 2000 02/28/17 2015 02/28/17 2030  BP: (!) 126/53 (!) 123/51 (!) 142/55 (!) 143/70  Pulse: 72 78 78 92  Resp: 14 14 16 14   Temp:      TempSrc:      SpO2: 97% 96% 98% 96%  Weight:      Height:       Eyes: PERRL, lids and conjunctivae normal ENMT: Mucous membranes are moist. Posterior pharynx clear of any exudate or lesions.Normal dentition.  Neck: normal, supple, no masses, no thyromegaly Respiratory: clear  to auscultation bilaterally, no wheezing, no crackles. Normal respiratory effort. No accessory muscle use.  Cardiovascular: Regular rate and rhythm, no murmurs / rubs / gallops. No extremity edema. 2+ pedal pulses. No carotid bruits.  Abdomen: no tenderness, no masses palpated. No hepatosplenomegaly. Bowel sounds positive.  Musculoskeletal: no clubbing / cyanosis. No joint deformity upper and lower extremities. Good ROM, no contractures. Normal muscle tone.  Skin: no rashes, lesions, ulcers. No induration Neurologic: CN 2-12 grossly intact. Sensation intact, DTR normal. Strength 5/5 in all 4.  Psychiatric: Normal judgment and insight. Alert and oriented x 3. Normal mood.    Labs on Admission: I have personally reviewed following labs and imaging studies  CBC: Recent Labs  Lab 02/28/17 1345  WBC 5.7  HGB 10.2*  HCT 31.8*  MCV 82.6  PLT 623   Basic Metabolic Panel: Recent Labs  Lab 02/28/17 1345  NA 131*  K 4.1  CL 98*  CO2 23  GLUCOSE 118*  BUN 9  CREATININE 0.94  CALCIUM 9.0   GFR: Estimated Creatinine Clearance: 39.4 mL/min (by C-G formula based on SCr of 0.94 mg/dL). Liver Function Tests: No results for input(s): AST, ALT, ALKPHOS, BILITOT, PROT, ALBUMIN in the last 168 hours. No results for input(s): LIPASE, AMYLASE in the last 168 hours. No results for input(s): AMMONIA in the last 168 hours. Coagulation Profile: No results for input(s): INR, PROTIME in the last 168  hours. Cardiac Enzymes: No results for input(s): CKTOTAL, CKMB, CKMBINDEX, TROPONINI in the last 168 hours. BNP (last 3 results) No results for input(s): PROBNP in the last 8760 hours. HbA1C: No results for input(s): HGBA1C in the last 72 hours. CBG: Recent Labs  Lab 02/28/17 1342 02/28/17 1910  GLUCAP 117* 97   Lipid Profile: No results for input(s): CHOL, HDL, LDLCALC, TRIG, CHOLHDL, LDLDIRECT in the last 72 hours. Thyroid Function Tests: No results for input(s): TSH, T4TOTAL, FREET4, T3FREE, THYROIDAB in the last 72 hours. Anemia Panel: No results for input(s): VITAMINB12, FOLATE, FERRITIN, TIBC, IRON, RETICCTPCT in the last 72 hours. Urine analysis:    Component Value Date/Time   COLORURINE STRAW (A) 02/28/2017 1843   APPEARANCEUR CLEAR 02/28/2017 1843   LABSPEC 1.004 (L) 02/28/2017 1843   PHURINE 7.0 02/28/2017 1843   GLUCOSEU NEGATIVE 02/28/2017 1843   HGBUR NEGATIVE 02/28/2017 1843   BILIRUBINUR NEGATIVE 02/28/2017 1843   BILIRUBINUR 2+ 03/18/2016 1447   KETONESUR NEGATIVE 02/28/2017 1843   PROTEINUR NEGATIVE 02/28/2017 1843   UROBILINOGEN 4.0 03/18/2016 1447   UROBILINOGEN 0.2 11/13/2008 2332   NITRITE NEGATIVE 02/28/2017 1843   LEUKOCYTESUR TRACE (A) 02/28/2017 1843   Sepsis Labs: !!!!!!!!!!!!!!!!!!!!!!!!!!!!!!!!!!!!!!!!!!!! @LABRCNTIP (procalcitonin:4,lacticidven:4) )No results found for this or any previous visit (from the past 240 hour(s)).   Radiological Exams on Admission: Ct Head Wo Contrast  Result Date: 02/28/2017 CLINICAL DATA:  Syncopal episode EXAM: CT HEAD WITHOUT CONTRAST TECHNIQUE: Contiguous axial images were obtained from the base of the skull through the vertex without intravenous contrast. COMPARISON:  01/14/2016 FINDINGS: Brain: No acute territorial infarction, hemorrhage or intracranial mass is visualized. Mild to moderate atrophy. Stable ventricle size. Mild small vessel ischemic changes of the white matter Vascular: No hyperdense vessels.   Carotid artery calcification. Skull: No fracture. Sinuses/Orbits: Mucosal thickening in the sphenoid and ethmoid sinuses. No acute orbital abnormality. Other: None IMPRESSION: No CT evidence for acute intracranial abnormality. Atrophy and small vessel ischemic changes of the white matter. Electronically Signed   By: Donavan Foil M.D.   On: 02/28/2017 20:59  EKG: Independently reviewed. nsr qrs widening old c/w to prior Old chart reviewed Case discussed with edp   Assessment/Plan 83 yo female with recurrent syncope of unclear etiology  Principal Problem:   Syncope- does not sound like she has had EP evaluation but had holter monitor which was reported normal back in 11/17.  Her syncopal episodes seemed to have resolved with med changes done by dr Rockey Situ cardiologist.  No focal neuro deficits.  Monitor on tele.  Consider EP provacative studies.  Serial trop.  Cardiac echo in am.  Ck orthostatics.  Active Problems:   B12 deficiency- noted   Hyperlipidemia- cont statin   Essential hypertension, benign- cont home meds   BPPV (benign paroxysmal positional vertigo)- noted   CAD (coronary artery disease), native coronary artery- noted    DVT prophylaxis:  scds Code Status:  full Family Communication:  grandkid Disposition Plan:  Per day team Consults called:  none Admission status:  observation   DAVID,RACHAL A MD [facility]  If 7PM-7AM, please contact night-coverage [email or web address] Password [email or web address]  02/28/2017, 9:06 PM

## 2017-02-28 NOTE — Telephone Encounter (Signed)
Grand daughter Danae Chen called stated that they went shopping at [employer] and ate breakfast. While standing in line, pt stated "I feel like Im going to pass out" Danae Chen stated she colla sped and was unconscious for approximately 30 seconds. Erica woke up oriented and alert. Called 911 and paramedic did EKG and stated there was "nothing new."Denies chest pain, SOB. Stated BP was 136/63. Pt had heart attack 02/10/17. Disposition was to go to ED or PCP/ alternative with approval. Called PCP office and spoke to Rod Holler who agrees to send pt to ER ASAP. Danae Chen stated understanding..  Reason for Disposition . [1] Age > 50 years  AND [2] now alert and feels fine  Answer Assessment - Initial Assessment Questions 1. ONSET: "How long were you unconscious?" (minutes) "When did it happen?"     30 seconds it happened this am  2. CONTENT: "What happened during period of unconsciousness?" (e.g., seizure activity)      No sz activity, pt stated that she was going to pass out and she did. 3. MENTAL STATUS: "Alert and oriented now?" (oriented x 3 = name, month, location)      Yes  4. TRIGGER: "What do you think caused the fainting?" "What were you doing just before you fainted?"  (e.g., exercise, sudden standing up, prolonged standing)     Unknown to pt and grandaughter 5. RECURRENT SYMPTOM: "Have you ever passed out before?" If so, ask: "When was the last time?" and "What happened that time?"      Unknown  6. INJURY: "Did you sustain any injury during the fall?"      no 7. CARDIAC SYMPTOMS: "Have you had any of the following symptoms: chest pain, difficulty breathing, palpitations?"     no 8. NEUROLOGIC SYMPTOMS: "Have you had any of the following symptoms: headache, numbness, vertigo, weakness?"     weakness 9. GI SYMPTOMS: "Have you had any of the following symptoms: abdominal pain, vomiting, diarrhea, blood in stools?"     unknown 10. OTHER SYMPTOMS: "Do you have any other symptoms?"       no 11.  PREGNANCY: "Is there any chance you are pregnant?" "When was your last menstrual period?"       no  Protocols used: [facility]

## 2017-02-28 NOTE — Telephone Encounter (Signed)
Agree with ED advisement  Will watch for notes

## 2017-02-28 NOTE — ED Provider Notes (Signed)
[facility] DEPART[facility] Provider Note   CSN: [id number] Arrival date & time: 02/28/17  1311     History   Chief Complaint Chief Complaint  Patient presents with  . Loss of Consciousness    HPI Jenna Young is a 83 y.o. female.  HPI 83 yo female with PMH CAD with recent MI on 02/10/17, Breast Cancer (treated), BPPV, HLD who presents after syncopal episode. She was at a store with her family member when she had her vision get dark and felt hot. She knew she was about to fait. The family member caught her fall. She regained consciousness after 45 seconds. She did not have any abnormal body movements, urinary or bowel incontinence. She did not have any confusion after she regained consciousness. She reports of history of syncopal episodes which first started about 2-3 years ago. It was thought to be mediation related, and family reports symptoms resolved after changes in medication. She had holter monitor which was normal. The last episode was in May. Of note, patient was admitted on 02/10/17 for NSTEMI. Cardiac cath at that time mentioned of unsuccessful PCI to mid RCA.  Per discharge summary, her imdur and beta blocker were held due to bradycardia. However, at follow up cardiology appointment in 12/14, was started on metoprolol.   Past Medical History:  Diagnosis Date  . Allergic rhinitis   . Anemia   . Anxiety   . GERD (gastroesophageal reflux disease)   . History of radiation therapy 04/29/16- 05/24/16   Left Breast 40.05 Gy in 15 fractions, Left Breast boost 10 Gy in 5 fractions.   . Hyperlipidemia   . Hypertension    treated by Dr Thurnell Garbe recently with syncope episode  . Osteopenia   . Osteoporosis     Patient Active Problem List   Diagnosis Date Noted  . CAD (coronary artery disease), native coronary artery 02/21/2017  . NSTEMI (non-ST elevated myocardial infarction) ([place]) 02/10/2017  . Carcinoma of central portion of left breast in female,  estrogen receptor negative ([place]) 04/17/2016  . Ductal carcinoma in situ (DCIS) of left breast 02/26/2016  . Varicose vein of leg 10/09/2015  . Fatigue 10/09/2015  . Pedal edema 10/09/2015  . Heart murmur 10/09/2015  . History of spinal fracture 05/07/2015  . Lumbar pain 05/03/2015  . Lipoma of face 11/08/2014  . Dysthymia 09/20/2013  . Encounter for Medicare annual wellness exam 02/03/2013  . BPPV (benign paroxysmal positional vertigo) 06/15/2012  . Chronic right ear pain 03/16/2012  . Colon cancer screening 01/17/2012  . Anemia 01/17/2012  . GRIEF REACTION 08/01/2009  . Vitamin D deficiency 08/19/2008  . ANXIETY 01/29/2008  . Essential hypertension, benign 09/25/2007  . B12 deficiency 09/24/2006  . Hyperlipidemia 09/24/2006  . Venous (peripheral) insufficiency 09/24/2006  . ALLERGIC RHINITIS 09/24/2006  . GERD 09/24/2006  . Osteoporosis 09/24/2006    Past Surgical History:  Procedure Laterality Date  . APPENDECTOMY    . BREAST LUMPECTOMY WITH RADIOACTIVE SEED LOCALIZATION Left 02/27/2016   Procedure: LEFT BREAST LUMPECTOMY WITH RADIOACTIVE SEED LOCALIZATION;  Surgeon: Autumn Messing III, MD;  Location: [facility];  Service: General;  Laterality: Left;  . BREAST SURGERY  10/10   benign biopsy  negative  . CESAREAN SECTION    . CHOLECYSTECTOMY    . CORONARY STENT INTERVENTION N/A 02/11/2017   Procedure: CORONARY STENT INTERVENTION;  Surgeon: Nelva Bush, MD;  Location: [facility];  Service: Cardiovascular;  Laterality: N/A;  . FEMUR FRACTURE SURGERY  Right 2006  . LEFT HEART CATH AND CORONARY ANGIOGRAPHY N/A 02/11/2017   Procedure: LEFT HEART CATH AND CORONARY ANGIOGRAPHY;  Surgeon: Nelva Bush, MD;  Location: [facility];  Service: Cardiovascular;  Laterality: N/A;  . OVARIAN CYST SURGERY    . RE-EXCISION OF BREAST LUMPECTOMY Left 04/01/2016   Procedure: RE-EXCISION OF LEFT BREAST MEDIAL MARGIN;  Surgeon: Autumn Messing III, MD;  Location:  [facility];  Service: General;  Laterality: Left;    OB History    No data available       Home Medications    Prior to Admission medications   Medication Sig Start Date End Date Taking? Authorizing Provider  aspirin (ASPIRIN EC) 81 MG EC tablet Take 81 mg by mouth daily as needed for pain. Swallow whole.     [provider]  atorvastatin (LIPITOR) 40 MG tablet Take 1 tablet (40 mg total) by mouth daily at 6 PM. 02/13/17   Demetrios Loll, MD  Bromfenac Sodium 0.07 % SOLN Place 1 drop into the right eye at bedtime.    [provider]  calcium-vitamin D (OSCAL WITH D) 500-200 MG-UNIT per tablet Take 2 tablets by mouth daily.      [provider]  Cholecalciferol (VITAMIN D) 2000 UNITS CAPS Take 1 capsule by mouth daily.      [provider]  clopidogrel (PLAVIX) 75 MG tablet Take 1 tablet (75 mg total) by mouth daily with breakfast. 02/13/17   Demetrios Loll, MD  cyanocobalamin (,VITAMIN B-12,) 1000 MCG/ML injection Inject 1,000 mcg into the muscle every 3 (three) months.    [provider]  Difluprednate 0.05 % EMUL Place 1 drop into the right eye 3 (three) times daily.    [provider]  isosorbide mononitrate (IMDUR) 30 MG 24 hr tablet Take 30 mg by mouth daily.    [provider]  losartan (COZAAR) 100 MG tablet Take 0.5 tablets (50 mg total) by mouth daily. 02/13/17   Demetrios Loll, MD  metoprolol succinate (TOPROL-XL) 25 MG 24 hr tablet Take 1 tablet (25 mg total) by mouth daily. Take with or immediately following a meal. 02/21/17   Gollan, Kathlene November, MD  nitroGLYCERIN (NITROSTAT) 0.4 MG SL tablet Place 1 tablet (0.4 mg total) under the tongue every 5 (five) minutes x 3 doses as needed for chest pain. 02/13/17   Demetrios Loll, MD  omeprazole (PRILOSEC) 20 MG capsule Take 1 capsule (20 mg total) by mouth daily. 02/27/17   Tower, [name], MD    Family History Family History  Problem Relation Age of Onset  . Hypertension  Mother   . Heart disease Father   . Macular degeneration Sister   . Diabetes Maternal Aunt   . Cancer Maternal Aunt        throat    Social History Social History   Tobacco Use  . Smoking status: Former Smoker    Last attempt to quit: 03/11/1968    Years since quitting: 49.0  . Smokeless tobacco: Never Used  Substance Use Topics  . Alcohol use: No    Alcohol/week: 0.0 oz  . Drug use: No     Allergies   Buspirone hcl; Codeine; Hydrochlorothiazide; Lansoprazole; Minocycline hcl; Penicillins; Ramipril; and Sulfa antibiotics   Review of Systems Review of Systems  Constitutional: Negative for chills and fever.  HENT: Negative for rhinorrhea and sore throat.   Respiratory: Negative for cough, chest tightness and shortness of breath.   Cardiovascular: Negative for  chest pain, palpitations and leg swelling.  Gastrointestinal: Negative for abdominal pain, constipation, diarrhea and nausea.  Genitourinary: Negative for dysuria and frequency.  Neurological: Negative for dizziness, speech difficulty, weakness, light-headedness and numbness.   Physical Exam Updated Vital Signs BP (!) 150/59   Pulse 78   Temp 98 F (36.7 C) (Oral)   Resp 17   Ht 5\' 2"  (1.575 m)   Wt 62.6 kg (138 lb)   SpO2 98%   BMI 25.24 kg/m   Physical Exam  Constitutional: She is oriented to person, place, and time. She appears well-developed and well-nourished. No distress.  HENT:  Mouth/Throat: Oropharynx is clear and moist. No oropharyngeal exudate.  Eyes: Conjunctivae and EOM are normal. Pupils are equal, round, and reactive to light.  Neck: Normal range of motion.  Cardiovascular: Normal rate and regular rhythm.  Murmur heard. Pulmonary/Chest: Effort normal and breath sounds normal. She has no wheezes. She has no rales.  Abdominal: Soft. Bowel sounds are normal. She exhibits no mass. There is no tenderness.  Neurological: She is alert and oriented to person, place, and time.  Skin: Skin is warm  and dry. Capillary refill takes less than 2 seconds. She is not diaphoretic.  Psychiatric: She has a normal mood and affect. Her behavior is normal. Judgment and thought content normal.     ED Treatments / Results  Labs (all labs ordered are listed, but only abnormal results are displayed) Labs Reviewed  BASIC METABOLIC PANEL - Abnormal; Notable for the following components:      Result Value   Sodium 131 (*)    Chloride 98 (*)    Glucose, Bld 118 (*)    GFR calc non Af Amer 55 (*)    All other components within normal limits  CBC - Abnormal; Notable for the following components:   RBC 3.85 (*)    Hemoglobin 10.2 (*)    HCT 31.8 (*)    RDW 16.1 (*)    All other components within normal limits  CBG MONITORING, ED - Abnormal; Notable for the following components:   Glucose-Capillary 117 (*)    All other components within normal limits  URINALYSIS, ROUTINE W REFLEX MICROSCOPIC    EKG  EKG Interpretation  Date/Time:  Friday February 28 2017 13:49:29 EST Ventricular Rate:  90 PR Interval:  130 QRS Duration: 122 QT Interval:  388 QTC Calculation: 474 R Axis:   -28 Text Interpretation:  Normal sinus rhythm Left ventricular hypertrophy with QRS widening Possible Lateral infarct , age undetermined Abnormal ECG improved rate No significant change since last tracing Confirmed by Merrily Pew [phone]) on 02/28/2017 5:47:16 PM       Radiology No results found.  Procedures Procedures (including critical care time)  Medications Ordered in ED Medications - No data to display   Initial Impression / Assessment and Plan / ED Course  I have reviewed the triage vital signs and the nursing notes.  Pertinent labs & imaging results that were available during my care of the patient were reviewed by me and considered in my medical decision making (see chart for details).  83 yo female with recent history of NSTEMI (02/10/17) and history of syncopal  presenting after syncopal episode.  Her story sounds consistent with vasovagal episode. However, with her recent NSTEMI, need to think about arrhythmia. Patient's vitals stable. CBC stable, BMP unremarkable other than for some mild hyponatremia to 131. UA with trace leukocytoses but only 0-5 wbc. EKG is unchanged. Istat troponin 0. Consult  [facility] for [facility].   Spoke with [facility]. Requests CT head and orthostatic vitals. Ordered.   Final Clinical Impressions(s) / ED Diagnoses   Final diagnoses:  None    ED Discharge Orders    None       Smiley Houseman, MD 02/28/17 2013    Merrily Pew, MD 03/01/17 [phone]

## 2017-02-28 NOTE — ED Triage Notes (Signed)
Onset today while shopping told granddaughter she was going to pass out and did for 45 seconds.  No trauma. EMS called vitals signs, EKG, and CBG stable. [place] daughter drove patient to the ED. Patient has no pain, answering and following commands appropriate. States feels general weakness.

## 2017-03-01 ENCOUNTER — Observation Stay (HOSPITAL_BASED_OUTPATIENT_CLINIC_OR_DEPARTMENT_OTHER): Payer: Medicare Other

## 2017-03-01 DIAGNOSIS — R55 Syncope and collapse: Secondary | ICD-10-CM | POA: Diagnosis not present

## 2017-03-01 DIAGNOSIS — I351 Nonrheumatic aortic (valve) insufficiency: Secondary | ICD-10-CM | POA: Diagnosis not present

## 2017-03-01 DIAGNOSIS — Z888 Allergy status to other drugs, medicaments and biological substances status: Secondary | ICD-10-CM | POA: Diagnosis not present

## 2017-03-01 DIAGNOSIS — Z87891 Personal history of nicotine dependence: Secondary | ICD-10-CM | POA: Diagnosis not present

## 2017-03-01 DIAGNOSIS — Z885 Allergy status to narcotic agent status: Secondary | ICD-10-CM | POA: Diagnosis not present

## 2017-03-01 DIAGNOSIS — I214 Non-ST elevation (NSTEMI) myocardial infarction: Secondary | ICD-10-CM | POA: Diagnosis present

## 2017-03-01 DIAGNOSIS — Z853 Personal history of malignant neoplasm of breast: Secondary | ICD-10-CM | POA: Diagnosis not present

## 2017-03-01 DIAGNOSIS — Z880 Allergy status to penicillin: Secondary | ICD-10-CM | POA: Diagnosis not present

## 2017-03-01 DIAGNOSIS — F419 Anxiety disorder, unspecified: Secondary | ICD-10-CM | POA: Diagnosis present

## 2017-03-01 DIAGNOSIS — I739 Peripheral vascular disease, unspecified: Secondary | ICD-10-CM | POA: Diagnosis present

## 2017-03-01 DIAGNOSIS — H8113 Benign paroxysmal vertigo, bilateral: Secondary | ICD-10-CM | POA: Diagnosis not present

## 2017-03-01 DIAGNOSIS — I10 Essential (primary) hypertension: Secondary | ICD-10-CM | POA: Diagnosis not present

## 2017-03-01 DIAGNOSIS — Z923 Personal history of irradiation: Secondary | ICD-10-CM | POA: Diagnosis not present

## 2017-03-01 DIAGNOSIS — Z79899 Other long term (current) drug therapy: Secondary | ICD-10-CM | POA: Diagnosis not present

## 2017-03-01 DIAGNOSIS — K219 Gastro-esophageal reflux disease without esophagitis: Secondary | ICD-10-CM | POA: Diagnosis present

## 2017-03-01 DIAGNOSIS — E538 Deficiency of other specified B group vitamins: Secondary | ICD-10-CM | POA: Diagnosis not present

## 2017-03-01 DIAGNOSIS — E782 Mixed hyperlipidemia: Secondary | ICD-10-CM | POA: Diagnosis not present

## 2017-03-01 DIAGNOSIS — M810 Age-related osteoporosis without current pathological fracture: Secondary | ICD-10-CM | POA: Diagnosis present

## 2017-03-01 DIAGNOSIS — Z7982 Long term (current) use of aspirin: Secondary | ICD-10-CM | POA: Diagnosis not present

## 2017-03-01 DIAGNOSIS — H8110 Benign paroxysmal vertigo, unspecified ear: Secondary | ICD-10-CM | POA: Diagnosis present

## 2017-03-01 DIAGNOSIS — I2510 Atherosclerotic heart disease of native coronary artery without angina pectoris: Secondary | ICD-10-CM | POA: Diagnosis not present

## 2017-03-01 DIAGNOSIS — E785 Hyperlipidemia, unspecified: Secondary | ICD-10-CM | POA: Diagnosis present

## 2017-03-01 DIAGNOSIS — E559 Vitamin D deficiency, unspecified: Secondary | ICD-10-CM | POA: Diagnosis present

## 2017-03-01 DIAGNOSIS — Z7902 Long term (current) use of antithrombotics/antiplatelets: Secondary | ICD-10-CM | POA: Diagnosis not present

## 2017-03-01 DIAGNOSIS — Z882 Allergy status to sulfonamides status: Secondary | ICD-10-CM | POA: Diagnosis not present

## 2017-03-01 DIAGNOSIS — Z955 Presence of coronary angioplasty implant and graft: Secondary | ICD-10-CM | POA: Diagnosis not present

## 2017-03-01 DIAGNOSIS — F319 Bipolar disorder, unspecified: Secondary | ICD-10-CM | POA: Diagnosis present

## 2017-03-01 LAB — CBC
HCT: 30.7 % — ABNORMAL LOW (ref 36.0–46.0)
Hemoglobin: 10 g/dL — ABNORMAL LOW (ref 12.0–15.0)
MCH: 26.5 pg (ref 26.0–34.0)
MCHC: 32.6 g/dL (ref 30.0–36.0)
MCV: 81.4 fL (ref 78.0–100.0)
Platelets: 228 10*3/uL (ref 150–400)
RBC: 3.77 MIL/uL — ABNORMAL LOW (ref 3.87–5.11)
RDW: 15.7 % — ABNORMAL HIGH (ref 11.5–15.5)
WBC: 5.6 10*3/uL (ref 4.0–10.5)

## 2017-03-01 LAB — TROPONIN I
Troponin I: <0.03 ng/mL (ref <0.03)
Troponin I: <0.03 ng/mL (ref <0.03)
Troponin I: <0.03 ng/mL (ref <0.03)

## 2017-03-01 LAB — BASIC METABOLIC PANEL
Anion gap: 10 (ref 5–15)
BUN: 7 mg/dL (ref 6–20)
CO2: 21 mmol/L — ABNORMAL LOW (ref 22–32)
Calcium: 9.1 mg/dL (ref 8.9–10.3)
Chloride: 104 mmol/L (ref 101–111)
Creatinine, Ser: 0.88 mg/dL (ref 0.44–1.00)
GFR calc Af Amer: >60 mL/min (ref >60)
GFR calc non Af Amer: 59 mL/min — ABNORMAL LOW (ref >60)
Glucose, Bld: 93 mg/dL (ref 65–99)
Potassium: 4.1 mmol/L (ref 3.5–5.1)
Sodium: 135 mmol/L (ref 135–145)

## 2017-03-01 LAB — ECHOCARDIOGRAM COMPLETE
Height: 62 in
Weight: 2177.6 oz

## 2017-03-01 MED ORDER — ISOSORBIDE MONONITRATE ER 30 MG PO TB24
15.0000 mg | ORAL_TABLET | Freq: Every day | ORAL | Status: DC
  Administered 2017-03-02: 15 mg via ORAL
  Filled 2017-03-01: qty 1

## 2017-03-01 NOTE — Progress Notes (Signed)
  Echocardiogram 2D Echocardiogram has been performed.  [place] Youngblood T Aliesha Dolata 03/01/2017, 12:57 PM

## 2017-03-01 NOTE — Progress Notes (Signed)
PROGRESS NOTE    Jenna Young  PYK:[id number] DOB: 12/31/1931 DOA: 02/28/2017 PCP: Abner Greenspan, MD   Specialists:   Dr. Tawnya Crook  Brief Narrative:  13 wf Bipolar, htn, hld, pvd, sinus brady, hospitlzd Near syncope 12/2015, 11/2008 [med related anti-htn?]declined 30 d-monitor [?V-vagal?], DCIS L breast s/p surgery 04/01/16. Recent admit [facility] 12/3 Burning in chest-Troponin 0.89-ST flip Ld 3/AVF-started on IV heparin-NON-stemi-Cath RCA disease-felt if recurrent sym review for PCI 2-4 wk placed on ASA/PLavix for 12 mo-held Imdur/BB on d/c2/2 bradycardia-started on metoprolol 12/14 OV Cardiology   Assessment & Plan:   Principal Problem:   Syncope Active Problems:   B12 deficiency   Hyperlipidemia   Essential hypertension, benign   BPPV (benign paroxysmal positional vertigo)   CAD (coronary artery disease), native coronary artery   Longstanding Syncope-meds likely culprit-check orthostatics- D/c Losartan, cut Imdur 30->15-keep torpol dose. Check for BPPV [has prior h/o], keep monitors-echo pending-troponins neg Non-stemi recently-ASa/PLavix 12 mo-see above med changes-needs OP follow-up HLD-cont Lipitor 40.  chl LDL 3 mo GERD-prior protonix, cont pepcid 20 qd b12 def-cont 1000 mcg q 3 mo as OP DCIS s/p surgery-OP re-eval-stable Bipolar/Anxiety reports-not on meds VIt D def-cont Supplement on d/c    DVT prophylaxis: none Code Status: none Family Communication: non Disposition Plan: home   Consultants:   none  Procedures:   none  Antimicrobials:   none    Subjective:  Awake aLErt tells me has had vestib rehab in past No dizzy now No cp Daughter with many concerns  Objective: Vitals:   02/28/17 2230 03/01/17 0143 03/01/17 0514 03/01/17 0801  BP: (!) 161/52  (!) 123/44   Pulse: 77  76   Resp: 20  18   Temp: 98.6 F (37 C)  98.8 F (37.1 C)   TempSrc: Oral  Oral   SpO2: 98%  96% 97%  Weight:  61.7 kg (136 lb 1.6 oz)    Height:       No  intake or output data in the 24 hours ending 03/01/17 0804 Filed Weights   02/28/17 1332 02/28/17 2229 03/01/17 0143  Weight: 62.6 kg (138 lb) 61.7 kg (136 lb 1.6 oz) 61.7 kg (136 lb 1.6 oz)    Examination:  EOMI PLEASAN TINA NAD cta b s1 s2 m RUSE No bruoit no jvd,  No le edema No focal deficit  Data Reviewed: I have personally reviewed following labs and imaging studies  CBC: Recent Labs  Lab 02/28/17 1345 03/01/17 0510  WBC 5.7 5.6  HGB 10.2* 10.0*  HCT 31.8* 30.7*  MCV 82.6 81.4  PLT 273 539   Basic Metabolic Panel: Recent Labs  Lab 02/28/17 1345 03/01/17 0510  NA 131* 135  K 4.1 4.1  CL 98* 104  CO2 23 21*  GLUCOSE 118* 93  BUN 9 7  CREATININE 0.94 0.88  CALCIUM 9.0 9.1   GFR: Estimated Creatinine Clearance: 41.8 mL/min (by C-G formula based on SCr of 0.88 mg/dL). Liver Function Tests: No results for input(s): AST, ALT, ALKPHOS, BILITOT, PROT, ALBUMIN in the last 168 hours. No results for input(s): LIPASE, AMYLASE in the last 168 hours. No results for input(s): AMMONIA in the last 168 hours. Coagulation Profile: No results for input(s): INR, PROTIME in the last 168 hours. Cardiac Enzymes: Recent Labs  Lab 02/28/17 2346 03/01/17 0510  TROPONINI <0.03 <0.03   BNP (last 3 results) No results for input(s): PROBNP in the last 8760 hours. HbA1C: No results for input(s): HGBA1C in the last  72 hours. CBG: Recent Labs  Lab 02/28/17 1342 02/28/17 1910  GLUCAP 117* 97   Lipid Profile: No results for input(s): CHOL, HDL, LDLCALC, TRIG, CHOLHDL, LDLDIRECT in the last 72 hours. Thyroid Function Tests: No results for input(s): TSH, T4TOTAL, FREET4, T3FREE, THYROIDAB in the last 72 hours. Anemia Panel: No results for input(s): VITAMINB12, FOLATE, FERRITIN, TIBC, IRON, RETICCTPCT in the last 72 hours. Urine analysis:    Component Value Date/Time   COLORURINE STRAW (A) 02/28/2017 1843   APPEARANCEUR CLEAR 02/28/2017 1843   LABSPEC 1.004 (L) 02/28/2017  1843   PHURINE 7.0 02/28/2017 1843   GLUCOSEU NEGATIVE 02/28/2017 1843   HGBUR NEGATIVE 02/28/2017 1843   BILIRUBINUR NEGATIVE 02/28/2017 1843   BILIRUBINUR 2+ 03/18/2016 1447   KETONESUR NEGATIVE 02/28/2017 1843   PROTEINUR NEGATIVE 02/28/2017 1843   UROBILINOGEN 4.0 03/18/2016 1447   UROBILINOGEN 0.2 11/13/2008 2332   NITRITE NEGATIVE 02/28/2017 1843   LEUKOCYTESUR TRACE (A) 02/28/2017 1843     Radiology Studies: Reviewed images personally in health database    Scheduled Meds: . aspirin EC  81 mg Oral Daily  . atorvastatin  40 mg Oral q1800  . clopidogrel  75 mg Oral Daily  . isosorbide mononitrate  30 mg Oral Daily  . losartan  50 mg Oral Daily  . metoprolol succinate  25 mg Oral Daily  . sodium chloride flush  3 mL Intravenous Q12H   Continuous Infusions: . sodium chloride       LOS: 0 days    Time spent: [place], MD [facility] ([phone]   If 7PM-7AM, please contact night-coverage [email or web address] Password [facility] 03/01/2017, 8:04 AM

## 2017-03-01 NOTE — Progress Notes (Signed)
PT Cancellation Note  Patient Details Name: Jenna Young MRN: [id number] DOB: 08-02-1931   Cancelled Treatment:    Reason Eval/Treat Not Completed: Patient at procedure or test/unavailable (Echo Lab). PT will continue to f/u with pt as available.    Clearnce Sorrel Eyanna Mcgonagle 03/01/2017, 1:48 PM

## 2017-03-02 DIAGNOSIS — E538 Deficiency of other specified B group vitamins: Secondary | ICD-10-CM

## 2017-03-02 MED ORDER — ISOSORBIDE MONONITRATE ER 30 MG PO TB24: 15.0000 mg | ORAL_TABLET | Freq: Every day | ORAL | 0 refills | Status: DC

## 2017-03-02 NOTE — Progress Notes (Signed)
Reviewed patient discharge teaching with patient and daughter, including activity, diet, follow-up appoints, and medications. Patient verbalized understanding of all discharge instructions. IV access was d/c'd. Vitals are stable. Skin is intact except as charted in most recent assessments. Pt to be escorted out by NT, to be driven home by family.

## 2017-03-02 NOTE — Evaluation (Addendum)
Physical Therapy Evaluation Patient Details Name: Jenna Young MRN: [id number] DOB: 12-Aug-1931 Today's Date: 03/02/2017   History of Present Illness  83 y.o. female admitted on 02/28/17 for syncopal episode while shopping.  Cardiac workup in progress.  Pt with significant PMH of HTN, anemia, multiple breast lumpectomies and radiation, coronary stent, R femur Fx 2006 s/p surgical repair, and MI.  Clinical Impression  Pt does not report any dizziness today and reports "oh no, this is not vertigo, I know what that feels like".  She looks good on her feet and has no overt signs or symptoms that this could be vertigo.  Her BP in standing after 10 mins was also stable at 142/58.  She had no pre syncopal symptoms with me while walking or while preforming self care tasks at the sink. She reports feeling back to her baseline.  PT to sign off as she has no acute needs at this time.     Follow Up Recommendations No PT follow up    Equipment Recommendations  None recommended by PT    Recommendations for Other Services   NA    Precautions / Restrictions Precautions Precautions: None      Mobility  Bed Mobility Overal bed mobility: Independent                Transfers Overall transfer level: Needs assistance Equipment used: None Transfers: Sit to/from Stand Sit to Stand: Supervision         General transfer comment: supervision for safety with cues to sit EOB for a minute before progressing to stand and stand for a minute before starting to walk.   Ambulation/Gait Ambulation/Gait assistance: Supervision Ambulation Distance (Feet): 200 Feet Assistive device: None Gait Pattern/deviations: Step-through pattern;Staggering left;Staggering right Gait velocity: decreased Gait velocity interpretation: Below normal speed for age/gender General Gait Details: very mildly staggering gait pattern, and mostly when walking, talking and turning her head.          Balance Overall  balance assessment: Needs assistance Sitting-balance support: No upper extremity supported;Feet supported Sitting balance-Leahy Scale: Good     Standing balance support: No upper extremity supported Standing balance-Leahy Scale: Good                               Pertinent Vitals/Pain Pain Assessment: No/denies pain    Home Living Family/patient expects to be discharged to:: Private residence Living Arrangements: Other relatives(grandchild (86 y.o.)) Available Help at Discharge: Family;Available PRN/intermittently(in AM and PM when not working someone stays with her) Type of Home: House Home Access: Stairs to enter Entrance Stairs-Rails: Psychiatric nurse of Steps: 5 Home Layout: One level Home Equipment: Cane - single point      Prior Function Level of Independence: Independent         Comments: walks without AD, drives     Hand Dominance   Dominant Hand: Right    Extremity/Trunk Assessment   Upper Extremity Assessment Upper Extremity Assessment: Overall WFL for tasks assessed    Lower Extremity Assessment Lower Extremity Assessment: Overall WFL for tasks assessed(pt reports h/o R hip fx due to fall on a gumball)    Cervical / Trunk Assessment Cervical / Trunk Assessment: Normal  Communication   Communication: No difficulties  Cognition Arousal/Alertness: Awake/alert Behavior During Therapy: WFL for tasks assessed/performed Overall Cognitive Status: Within Functional Limits for tasks assessed  General Comments General comments (skin integrity, edema, etc.): BP after 10 mins in standing was 142/58    Exercises General Exercises - Lower Extremity Hip ABduction/ADduction: AROM;Both;10 reps;Standing Hip Flexion/Marching: AROM;Both;10 reps;Standing Toe Raises: AROM;Both;20 reps;Standing Mini-Sqauts: AROM;Both;10 reps;Standing   Assessment/Plan    PT Assessment Patent  does not need any further PT services         PT Goals (Current goals can be found in the Care Plan section)  Acute Rehab PT Goals Patient Stated Goal: to figure out why this is happening.  PT Goal Formulation: All assessment and education complete, DC therapy     AM-PAC PT "6 Clicks" Daily Activity  Outcome Measure Difficulty turning over in bed (including adjusting bedclothes, sheets and blankets)?: None Difficulty moving from lying on back to sitting on the side of the bed? : None Difficulty sitting down on and standing up from a chair with arms (e.g., wheelchair, bedside commode, etc,.)?: None Help needed moving to and from a bed to chair (including a wheelchair)?: None Help needed walking in hospital room?: None Help needed climbing 3-5 steps with a railing? : None 6 Click Score: 24    End of Session Equipment Utilized During Treatment: Gait belt Activity Tolerance: Patient tolerated treatment well Patient left: in chair;Other (comment)(seated in chair at the sink with her daughter) Nurse Communication: Mobility status PT Visit Diagnosis: Difficulty in walking, not elsewhere classified (R26.2)    Time: 7001-7494 PT Time Calculation (min) (ACUTE ONLY): 46 min   Charges:         [place] Guiles B. Lyndon Chenoweth, PT, DPT [phone]   PT Evaluation $PT Eval Moderate Complexity: 1 Mod PT Treatments $Gait Training: 8-22 mins $Therapeutic Activity: 8-22 mins   03/02/2017, 12:18 PM

## 2017-03-02 NOTE — Discharge Summary (Signed)
Discharge Summary  Jenna Young IOE:[id number] DOB: 06-Apr-1931  PCP: Abner Greenspan, MD  Admit date: 02/28/2017 Discharge date: 03/02/2017  Time spent: >30 mins  Recommendations for Outpatient Follow-up:  1. PCP 2. Cardiology  Discharge Diagnoses:  Active Hospital Problems   Diagnosis Date Noted  . Syncope 02/28/2017  . CAD (coronary artery disease), native coronary artery 02/21/2017  . BPPV (benign paroxysmal positional vertigo) 06/15/2012  . Essential hypertension, benign 09/25/2007  . B12 deficiency 09/24/2006  . Hyperlipidemia 09/24/2006    Resolved Hospital Problems  No resolved problems to display.    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 Recent admit [facility] 12/3 Burning in chest-Troponin 0.89-ST flip Ld 3/AVF-NON-stemi-Cath RCA disease-felt if recurrent sym review for PCI 2-4 wk placed on ASA/PLavix for 12 mo-held Imdur/BB on d/c2/2 bradycardia-started on metoprolol 12/14 by Cardiology. Presented this admission with witnessed syncope at a store, didn't fall as she was caught. Pt admitted for further management  Today, pt denied any new complains, feels much better. Denies any chest pain, SOB, abdominal pain, N/V/D/C, fever/chillls, orthostasis, dizziness. Pt was evaluated by PT, no dizziness reported, no presyncopal episode while walking or performing tasks. No PT follow up recommended.  Hospital Course:  Principal Problem:   Syncope Active Problems:   B12 deficiency   Hyperlipidemia   Essential hypertension, benign   BPPV (benign paroxysmal positional vertigo)   CAD (coronary artery disease), native coronary artery  #Recurrent  syncope Likely due to meds Vs Vasovagal Vs BPPV Trops neg, EKG no acute changes Current orthostatics negative ECHO: LVEF 55-60%, Grade 1 DD, hypokinesis of the inferolateral myocardium    Plan to D/c Losartan, cut Imdur 30->15, continue toprol dose. May consider restarting losartan at the lowest dose given recent NSTEMI Follow up closely with PCP and cardiology Pt has a cardiac rehab appt up coming May consider EP studies is syncope persists  #NSTEMI Recently-ASa/PLavix 12 mo-see above med changes-needs OP follow-up Follow up with cardiology  #HLD cont Lipitor 40  #GERD- Cont pepcid 20 qd  #Vit b12 def Cont 1000 mcg q 3 mo as OP  #DCIS s/p surgery-OP re-eval-stable  Bipolar/Anxiety reports-not on meds  VIt D def-cont Supplement     Procedures:  None  Consultations:  None  Discharge Exam: BP (!) 125/58 (BP Location: Right Arm)   Pulse (!) 55   Temp 98.2 F (36.8 C) (Oral)   Resp 15   Ht 5\' 2"  (1.575 m)   Wt 63.4 kg (139 lb 12.4 oz)   SpO2 97%   BMI 25.56 kg/m   General: Alert, awake, oriented Cardiovascular: S1-S2 present, no added heart sounds Respiratory: Clear bilaterally  Discharge Instructions You were cared for by a hospitalist during your hospital stay. If you have any questions about your discharge medications or the care you received while you were in the hospital after you are discharged, you can call the unit and asked to speak with the hospitalist on call if the hospitalist that took care of you is not available. Once you are discharged, your primary care physician will handle any further medical issues. Please note that NO REFILLS  for any discharge medications will be authorized once you are discharged, as it is imperative that you return to your primary care physician (or establish a relationship with a primary care physician if you do not have one) for your aftercare needs so that they can reassess your need for medications and monitor your lab  values.  Discharge Instructions    Diet - low sodium heart healthy   Complete by:  As directed    Increase activity slowly   Complete by:  As directed      Allergies as of 03/02/2017      Reactions   Buspirone Hcl    REACTION: HEART PALPITATIONS   Codeine    REACTION: nausea and vomiting   Hydrochlorothiazide    REACTION: syncope-possibly from dehydration   Lansoprazole    REACTION: abd pain   Minocycline Hcl    Penicillins    REACTION: mouth numbness Has patient had a PCN reaction causing immediate rash, facial/tongue/throat swelling, SOB or lightheadedness with hypotension: No Has patient had a PCN reaction causing severe rash involving mucus membranes or skin necrosis: No Has patient had a PCN reaction that required hospitalization: No Has patient had a PCN reaction occurring within the last 10 years: No If all of the above answers are "NO", then may proceed with Cephalosporin use.   Phenergan [promethazine Hcl] Other (See Comments)   Body shakes, hallucinations   Ramipril    Cough   Sulfa Antibiotics Nausea Only      Medication List    STOP taking these medications   losartan 100 MG tablet Commonly known as:  COZAAR     TAKE these medications   aspirin EC 81 MG EC tablet Generic drug:  aspirin Take 81 mg by mouth daily. Swallow whole.   atorvastatin 40 MG tablet Commonly known as:  LIPITOR Take 1 tablet (40 mg total) by mouth daily at 6 PM.   Bromfenac Sodium 0.07 % Soln Place 1 drop into the right eye at bedtime.   clopidogrel 75 MG tablet Commonly known as:  PLAVIX Take 1 tablet (75 mg total) by mouth daily with breakfast.   cyanocobalamin 1000 MCG/ML injection Commonly known as:  (VITAMIN B-12) Inject 1,000 mcg into the muscle every 3 (three) months.   isosorbide mononitrate 30 MG 24 hr tablet Commonly known as:  IMDUR Take 0.5 tablets (15 mg total) by mouth daily. What changed:  how much to take   metoprolol succinate 25 MG 24 hr  tablet Commonly known as:  TOPROL-XL Take 1 tablet (25 mg total) by mouth daily. Take with or immediately following a meal.   nitroGLYCERIN 0.4 MG SL tablet Commonly known as:  NITROSTAT Place 1 tablet (0.4 mg total) under the tongue every 5 (five) minutes x 3 doses as needed for chest pain.   omeprazole 20 MG capsule Commonly known as:  PRILOSEC Take 1 capsule (20 mg total) by mouth daily. What changed:    when to take this  reasons to take this   Vitamin D 2000 units Caps Take 1 capsule by mouth daily.      Allergies  Allergen Reactions  . Buspirone Hcl     REACTION: HEART PALPITATIONS  . Codeine     REACTION: nausea and vomiting  . Hydrochlorothiazide     REACTION: syncope-possibly from dehydration  . Lansoprazole     REACTION: abd pain  . Minocycline Hcl   . Penicillins     REACTION: mouth numbness Has patient had a  PCN reaction causing immediate rash, facial/tongue/throat swelling, SOB or lightheadedness with hypotension: No Has patient had a PCN reaction causing severe rash involving mucus membranes or skin necrosis: No Has patient had a PCN reaction that required hospitalization: No Has patient had a PCN reaction occurring within the last 10 years: No If all of the above answers are "NO", then may proceed with Cephalosporin use.  Marland Kitchen Phenergan [Promethazine Hcl] Other (See Comments)    Body shakes, hallucinations  . Ramipril     Cough   . Sulfa Antibiotics Nausea Only   Follow-up Information    Tower, [name], MD. Schedule an appointment as soon as possible for a visit in 1 week(s).   Specialties:  Family Medicine, Radiology Contact information: [place] [address] [phone]        Minna Merritts, MD .   Specialty:  Cardiology Contact information: [place] [address] [phone]            The results of significant diagnostics from this hospitalization (including imaging,  microbiology, ancillary and laboratory) are listed below for reference.    Significant Diagnostic Studies: Dg Chest 2 View  Result Date: 02/10/2017 CLINICAL DATA:  Onset of chest pain around 7:30 this morning and has persisted associated with nausea with some diaphoresis. EXAM: CHEST  2 VIEW COMPARISON:  Portable chest x-ray of January 14, 2016 FINDINGS: The lungs are adequately inflated. The interstitial markings remain mildly increased. The cardiac silhouette is enlarged and more conspicuous today. The pulmonary vascularity is normal. There is calcification in the wall of the thoracic aorta. The bony thorax exhibits no acute abnormality. IMPRESSION: Mild cardiomegaly without pulmonary vascular congestion. No alveolar pneumonia. Chronic bronchitic-smoking related changes, stable. Thoracic aortic atherosclerosis. Electronically Signed   By: David  Martinique M.D.   On: 02/10/2017 11:24   Ct Head Wo Contrast  Result Date: 02/28/2017 CLINICAL DATA:  Syncopal episode EXAM: CT HEAD WITHOUT CONTRAST TECHNIQUE: Contiguous axial images were obtained from the base of the skull through the vertex without intravenous contrast. COMPARISON:  01/14/2016 FINDINGS: Brain: No acute territorial infarction, hemorrhage or intracranial mass is visualized. Mild to moderate atrophy. Stable ventricle size. Mild small vessel ischemic changes of the white matter Vascular: No hyperdense vessels.  Carotid artery calcification. Skull: No fracture. Sinuses/Orbits: Mucosal thickening in the sphenoid and ethmoid sinuses. No acute orbital abnormality. Other: None IMPRESSION: No CT evidence for acute intracranial abnormality. Atrophy and small vessel ischemic changes of the white matter. Electronically Signed   By: Donavan Foil M.D.   On: 02/28/2017 20:59    Microbiology: No results found for this or any previous visit (from the past 240 hour(s)).   Labs: Basic Metabolic Panel: Recent Labs  Lab 02/28/17 1345 03/01/17 0510  NA  131* 135  K 4.1 4.1  CL 98* 104  CO2 23 21*  GLUCOSE 118* 93  BUN 9 7  CREATININE 0.94 0.88  CALCIUM 9.0 9.1   Liver Function Tests: No results for input(s): AST, ALT, ALKPHOS, BILITOT, PROT, ALBUMIN in the last 168 hours. No results for input(s): LIPASE, AMYLASE in the last 168 hours. No results for input(s): AMMONIA in the last 168 hours. CBC: Recent Labs  Lab 02/28/17 1345 03/01/17 0510  WBC 5.7 5.6  HGB 10.2* 10.0*  HCT 31.8* 30.7*  MCV 82.6 81.4  PLT 273 228   Cardiac Enzymes: Recent Labs  Lab 02/28/17 2346 03/01/17 0510 03/01/17 1312  TROPONINI <0.03 <0.03 <  0.03   BNP: BNP (last 3 results) No results for input(s): BNP in the last 8760 hours.  ProBNP (last 3 results) No results for input(s): PROBNP in the last 8760 hours.  CBG: Recent Labs  Lab 02/28/17 1342 02/28/17 1910  GLUCAP 117* 97       Signed:  Alma Friendly, MD [facility] 03/02/2017, 7:24 PM

## 2017-03-03 ENCOUNTER — Ambulatory Visit (INDEPENDENT_AMBULATORY_CARE_PROVIDER_SITE_OTHER): Payer: Medicare Other | Admitting: Family Medicine

## 2017-03-03 ENCOUNTER — Encounter: Payer: Self-pay | Admitting: Family Medicine

## 2017-03-03 VITALS — BP 94/52 | HR 58 | Temp 97.6°F | Ht 61.0 in | Wt 137.8 lb

## 2017-03-03 DIAGNOSIS — I10 Essential (primary) hypertension: Secondary | ICD-10-CM

## 2017-03-03 DIAGNOSIS — E782 Mixed hyperlipidemia: Secondary | ICD-10-CM

## 2017-03-03 DIAGNOSIS — R55 Syncope and collapse: Secondary | ICD-10-CM

## 2017-03-03 DIAGNOSIS — I214 Non-ST elevation (NSTEMI) myocardial infarction: Secondary | ICD-10-CM

## 2017-03-03 DIAGNOSIS — D649 Anemia, unspecified: Secondary | ICD-10-CM | POA: Diagnosis not present

## 2017-03-03 DIAGNOSIS — I25118 Atherosclerotic heart disease of native coronary artery with other forms of angina pectoris: Secondary | ICD-10-CM

## 2017-03-03 NOTE — Progress Notes (Signed)
Subjective:    Patient ID: Jenna Young, female    DOB: June 25, 1931, 83 y.o.   MRN: [id number]  HPI Here for hospital f/u   [facility]ope in the setting of CAD  Witnessed syncope in a store- caught so she did not fall  Thought to be due to meds and/or vaso vagal and BPPV No cardiac changes  Last echo LVEF 55-60% with grade 1 DD and hypokinesis of inferolat myocardium  D/c losartan Cut imdur dose to 15 Continued toprol  cardiab rehab and cardiol f/u planned   (also considering EP studies for bradycardia)   Before then hosp 12/3 for non STEMI RCA dz -planned on PCI 2-4 wk and tx with asa/plavix (holding Indur and BB due to bradycardia)    lipitor 40 mg   In fact-pt has not actually filled or started the metoprolol at all !   Still feels a little weak and tired    BP Readings from Last 3 Encounters:  03/03/17 (!) 94/52  03/02/17 (!) 125/58  02/21/17 140/60  feels ok  No more syncope or near syncope    Pulse Readings from Last 3 Encounters:  03/03/17 (!) 58  03/02/17 (!) 55  02/21/17 80   Lab Results  Component Value Date   CREATININE 0.88 03/01/2017   BUN 7 03/01/2017   NA 135 03/01/2017   K 4.1 03/01/2017   CL 104 03/01/2017   CO2 21 (L) 03/01/2017   Lab Results  Component Value Date   ALT 10 03/21/2016   AST 15 03/21/2016   ALKPHOS 90 03/21/2016   BILITOT 0.2 03/21/2016   Lab Results  Component Value Date   WBC 5.6 03/01/2017   HGB 10.0 (L) 03/01/2017   HCT 30.7 (L) 03/01/2017   MCV 81.4 03/01/2017   PLT 228 03/01/2017   Lab Results  Component Value Date   TSH 1.116 02/11/2017    Lab Results  Component Value Date   CHOL 173 02/11/2017   HDL 53 02/11/2017   LDLCALC 100 (H) 02/11/2017   LDLDIRECT 116.6 01/27/2013   TRIG 98 02/11/2017   CHOLHDL 3.3 02/11/2017   Lab Results  Component Value Date   CKTOTAL 98 11/14/2008   CKMB 1.9 11/14/2008   TROPONINI <0.03 03/01/2017    .last glucose 117     Has appt with Dr Rockey Situ for march 11  Appetite -coming back  Drinking decaf tea   Patient Active Problem List   Diagnosis Date Noted  . Syncope 02/28/2017  . CAD (coronary artery disease), native coronary artery 02/21/2017  . NSTEMI (non-ST elevated myocardial infarction) ([place]) 02/10/2017  . Carcinoma of central portion of left breast in female, estrogen receptor negative ([place]) 04/17/2016  . Ductal carcinoma in situ (DCIS) of left breast 02/26/2016  . Varicose vein of leg 10/09/2015  . Fatigue 10/09/2015  . Pedal edema 10/09/2015  . Heart murmur 10/09/2015  . History of spinal fracture 05/07/2015  . Lumbar pain 05/03/2015  . Lipoma of face 11/08/2014  . Dysthymia 09/20/2013  . Encounter for Medicare annual wellness exam 02/03/2013  . BPPV (benign paroxysmal positional vertigo) 06/15/2012  . Chronic right ear pain 03/16/2012  . Colon cancer screening 01/17/2012  . Anemia 01/17/2012  . GRIEF REACTION 08/01/2009  . Vitamin D deficiency 08/19/2008  . ANXIETY 01/29/2008  . Essential hypertension, benign 09/25/2007  . B12 deficiency 09/24/2006  . Hyperlipidemia 09/24/2006  . Venous (peripheral) insufficiency 09/24/2006  . ALLERGIC  RHINITIS 09/24/2006  . GERD 09/24/2006  . Osteoporosis 09/24/2006   Past Medical History:  Diagnosis Date  . Allergic rhinitis   . Anemia   . Anxiety   . GERD (gastroesophageal reflux disease)   . History of radiation therapy 04/29/16- 05/24/16   Left Breast 40.05 Gy in 15 fractions, Left Breast boost 10 Gy in 5 fractions.   . Hyperlipidemia   . Hypertension    treated by Dr Thurnell Garbe recently with syncope episode  . Osteopenia   . Osteoporosis    Past Surgical History:  Procedure Laterality Date  . APPENDECTOMY    . BREAST LUMPECTOMY WITH RADIOACTIVE SEED LOCALIZATION Left 02/27/2016   Procedure: LEFT BREAST LUMPECTOMY WITH RADIOACTIVE SEED LOCALIZATION;  Surgeon: Autumn Messing III, MD;  Location: [facility];  Service:  General;  Laterality: Left;  . BREAST SURGERY  10/10   benign biopsy  negative  . CESAREAN SECTION    . CHOLECYSTECTOMY    . CORONARY STENT INTERVENTION N/A 02/11/2017   Procedure: CORONARY STENT INTERVENTION;  Surgeon: Nelva Bush, MD;  Location: [facility];  Service: Cardiovascular;  Laterality: N/A;  . FEMUR FRACTURE SURGERY Right 2006  . LEFT HEART CATH AND CORONARY ANGIOGRAPHY N/A 02/11/2017   Procedure: LEFT HEART CATH AND CORONARY ANGIOGRAPHY;  Surgeon: Nelva Bush, MD;  Location: [facility];  Service: Cardiovascular;  Laterality: N/A;  . OVARIAN CYST SURGERY    . RE-EXCISION OF BREAST LUMPECTOMY Left 04/01/2016   Procedure: RE-EXCISION OF LEFT BREAST MEDIAL MARGIN;  Surgeon: Autumn Messing III, MD;  Location: [facility];  Service: General;  Laterality: Left;   Social History   Tobacco Use  . Smoking status: Former Smoker    Last attempt to quit: 03/11/1968    Years since quitting: 49.0  . Smokeless tobacco: Never Used  Substance Use Topics  . Alcohol use: No    Alcohol/week: 0.0 oz  . Drug use: No   Family History  Problem Relation Age of Onset  . Hypertension Mother   . Heart disease Father   . Macular degeneration Sister   . Diabetes Maternal Aunt   . Cancer Maternal Aunt        throat   Allergies  Allergen Reactions  . Buspirone Hcl     REACTION: HEART PALPITATIONS  . Codeine     REACTION: nausea and vomiting  . Hydrochlorothiazide     REACTION: syncope-possibly from dehydration  . Lansoprazole     REACTION: abd pain  . Minocycline Hcl   . Penicillins     REACTION: mouth numbness Has patient had a PCN reaction causing immediate rash, facial/tongue/throat swelling, SOB or lightheadedness with hypotension: No Has patient had a PCN reaction causing severe rash involving mucus membranes or skin necrosis: No Has patient had a PCN reaction that required hospitalization: No Has patient had a PCN reaction occurring within the  last 10 years: No If all of the above answers are "NO", then may proceed with Cephalosporin use.  Marland Kitchen Phenergan [Promethazine Hcl] Other (See Comments)    Body shakes, hallucinations  . Ramipril     Cough   . Sulfa Antibiotics Nausea Only   Current Outpatient Medications on File Prior to Visit  Medication Sig Dispense Refill  . aspirin (ASPIRIN EC) 81 MG EC tablet Take 81 mg by mouth daily. Swallow whole.     Marland Kitchen atorvastatin (LIPITOR) 40 MG tablet Take 1 tablet (40 mg total) by mouth daily at 6 PM.  30 tablet 2  . Bromfenac Sodium 0.07 % SOLN Place 1 drop into the right eye at bedtime.    . Cholecalciferol (VITAMIN D) 2000 UNITS CAPS Take 1 capsule by mouth daily.      . clopidogrel (PLAVIX) 75 MG tablet Take 1 tablet (75 mg total) by mouth daily with breakfast. 30 tablet 2  . cyanocobalamin (,VITAMIN B-12,) 1000 MCG/ML injection Inject 1,000 mcg into the muscle every 3 (three) months.    . isosorbide mononitrate (IMDUR) 30 MG 24 hr tablet Take 0.5 tablets (15 mg total) by mouth daily. 15 tablet 0  . metoprolol succinate (TOPROL-XL) 25 MG 24 hr tablet Take 1 tablet (25 mg total) by mouth daily. Take with or immediately following a meal. 90 tablet 3  . nitroGLYCERIN (NITROSTAT) 0.4 MG SL tablet Place 1 tablet (0.4 mg total) under the tongue every 5 (five) minutes x 3 doses as needed for chest pain. 30 tablet 2  . omeprazole (PRILOSEC) 20 MG capsule Take 1 capsule (20 mg total) by mouth daily. (Patient taking differently: Take 20 mg by mouth daily as needed (acid reflux). ) 30 capsule 5   No current facility-administered medications on file prior to visit.     Review of Systems  Constitutional: Negative for activity change, appetite change, fever and unexpected weight change.  HENT: Negative for congestion, ear pain, rhinorrhea, sinus pressure and sore throat.   Eyes: Negative for pain, redness and visual disturbance.  Respiratory: Negative for cough, shortness of breath and wheezing.    Cardiovascular: Negative for chest pain and palpitations.  Gastrointestinal: Negative for abdominal pain, blood in stool, constipation and diarrhea.  Endocrine: Negative for polydipsia and polyuria.  Genitourinary: Negative for dysuria, frequency and urgency.  Musculoskeletal: Negative for arthralgias, back pain and myalgias.  Skin: Negative for pallor and rash.  Allergic/Immunologic: Negative for environmental allergies.  Neurological: Negative for dizziness, tremors, syncope, weakness, light-headedness, numbness and headaches.       Neg for focal weakness  Hematological: Negative for adenopathy. Does not bruise/bleed easily.  Psychiatric/Behavioral: Negative for decreased concentration and dysphoric mood. The patient is not nervous/anxious.        Objective:   Physical Exam  Constitutional: She appears well-developed and well-nourished. No distress.  Well appearing elderly female   HENT:  Head: Normocephalic and atraumatic.  Mouth/Throat: Oropharynx is clear and moist.  Eyes: Conjunctivae and EOM are normal. Pupils are equal, round, and reactive to light.  Neck: Normal range of motion. Neck supple. No JVD present. Carotid bruit is not present. No thyromegaly present.  Cardiovascular: Normal rate, regular rhythm, normal heart sounds and intact distal pulses. Exam reveals no gallop.  Pulmonary/Chest: Effort normal and breath sounds normal. No respiratory distress. She has no wheezes. She has no rales.  No crackles  Abdominal: Soft. Bowel sounds are normal. She exhibits no distension, no abdominal bruit and no mass. There is no tenderness.  Musculoskeletal: She exhibits no edema.  Lymphadenopathy:    She has no cervical adenopathy.  Neurological: She is alert. She has normal reflexes.  Skin: Skin is warm and dry. No rash noted.  Nl color and turgor  Psychiatric: She has a normal mood and affect.          Assessment & Plan:   Problem List Items Addressed This Visit       Cardiovascular and Mediastinum   Essential hypertension, benign    bp is low  BP: (!) 94/52 in setting of recent STEMI  On imdur  Holding arb   Will hold off starting beta blocker until contacting Dr Rockey Situ      NSTEMI (non-ST elevated myocardial infarction) [facility])    Reviewed hospital records, lab results and studies in detail   Currently doing well  F/u march with cardiol  Holding arb for hypotension  On imdur  plavix and asa-continue along with lipitor Pt has NOT started metoprolol due to hypotension  Will proceed with cardiac rehab       Syncope - Primary    Reviewed hospital records, lab results and studies in detail  Doing well at home- no further episodes (is not driving however)  bp is still low despite holding ARB (and pt has NOT started beta blocker from [facility] hosp yet) Pulse Rate: (!) 58  BP: (!) 94/52 pt denies orthostatic symptoms and is eating better  Disc imp of fluid intake  Tolerating imdur after cardiac event  Adv to hold off starting metoprolol given these numbers and will check in with her cardiologist (f/u is planned for march)            Other   Anemia    Fairly stable in hospital       Hyperlipidemia    In setting of CAD  Disc goal of LDL under 70 Continue atorvastatin and diet

## 2017-03-03 NOTE — Patient Instructions (Signed)
Your blood pressure is still a bit low  Hold the metoprolol (Toprol) - for now until further notice I will alert Dr Rockey Situ   Take care of yourself  Make sure you are drinking fluids (ultimately 64 oz per day- mostly water) Eat regular meals (protien)

## 2017-03-04 NOTE — Assessment & Plan Note (Addendum)
Reviewed hospital records, lab results and studies in detail   Currently doing well  F/u march with cardiol  Holding arb for hypotension  On imdur  plavix and asa-continue along with lipitor Pt has NOT started metoprolol due to hypotension  Will proceed with cardiac rehab

## 2017-03-04 NOTE — Assessment & Plan Note (Signed)
bp is low  BP: (!) 94/52 in setting of recent STEMI  On imdur Holding arb   Will hold off starting beta blocker until contacting Dr Rockey Situ

## 2017-03-04 NOTE — Assessment & Plan Note (Signed)
Reviewed hospital records, lab results and studies in detail  Doing well at home- no further episodes (is not driving however)  bp is still low despite holding ARB (and pt has NOT started beta blocker from [facility] hosp yet) Pulse Rate: (!) 58  BP: (!) 94/52 pt denies orthostatic symptoms and is eating better  Disc imp of fluid intake  Tolerating imdur after cardiac event  Adv to hold off starting metoprolol given these numbers and will check in with her cardiologist (f/u is planned for march)

## 2017-03-04 NOTE — Assessment & Plan Note (Signed)
In setting of CAD  Disc goal of LDL under 70 Continue atorvastatin and diet

## 2017-03-04 NOTE — Assessment & Plan Note (Signed)
Fairly stable in hospital

## 2017-03-06 ENCOUNTER — Encounter: Payer: Medicare Other | Admitting: *Deleted

## 2017-03-06 DIAGNOSIS — Z79899 Other long term (current) drug therapy: Secondary | ICD-10-CM | POA: Diagnosis not present

## 2017-03-06 DIAGNOSIS — K219 Gastro-esophageal reflux disease without esophagitis: Secondary | ICD-10-CM | POA: Diagnosis not present

## 2017-03-06 DIAGNOSIS — Z923 Personal history of irradiation: Secondary | ICD-10-CM | POA: Diagnosis not present

## 2017-03-06 DIAGNOSIS — I214 Non-ST elevation (NSTEMI) myocardial infarction: Secondary | ICD-10-CM

## 2017-03-06 DIAGNOSIS — Z7902 Long term (current) use of antithrombotics/antiplatelets: Secondary | ICD-10-CM | POA: Diagnosis not present

## 2017-03-06 DIAGNOSIS — Z7982 Long term (current) use of aspirin: Secondary | ICD-10-CM | POA: Diagnosis not present

## 2017-03-06 DIAGNOSIS — I252 Old myocardial infarction: Secondary | ICD-10-CM | POA: Diagnosis not present

## 2017-03-06 NOTE — Progress Notes (Signed)
Daily Session Note  Patient Details  Name: Jenna Young MRN: [id number] Date of Birth: 06-07-1931 Referring Provider:     Cardiac Rehab from 02/20/2017 in [facility]  Referring Provider  End      Encounter Date: 03/06/2017  Check In: Session Check In - 03/06/17 1157      Check-In   Location  [facility]Cardiac [facility] Pul[facility]    Staff Present  Nada Maclachlan, BA, ACSM CEP, Exercise Physiologist;Carroll Enterkin, RN, Levie Heritage, MA, ACSM RCEP, Exercise Physiologist    Supervising physician immediately available to respond to emergencies  See telemetry face sheet for immediately available ER MD    Medication changes reported      Yes    Comments  decreased losartan and imdur    Fall or balance concerns reported     No    Warm-up and Cool-down  Performed on first and last piece of equipment    Resistance Training Performed  Yes    VAD Patient?  No      Pain Assessment   Currently in Pain?  No/denies          Social History   Tobacco Use  Smoking Status Former Smoker  . Last attempt to quit: 03/11/1968  . Years since quitting: 49.0  Smokeless Tobacco Never Used    Goals Met:  Exercise tolerated well Personal goals reviewed No report of cardiac concerns or symptoms Strength training completed today  Goals Unmet:  Not Applicable  Comments: First full day of exercise!  Patient was oriented to gym and equipment including functions, settings, policies, and procedures.  Patient's individual exercise prescription and treatment plan were reviewed.  All starting workloads were established based on the results of the 6 minute walk test done at initial orientation visit.  The plan for exercise progression was also introduced and progression will be customized based on patient's performance and goals.    Dr. Emily Filbert is Medical Director for [place] and [facility].

## 2017-03-12 ENCOUNTER — Encounter: Payer: Self-pay | Admitting: *Deleted

## 2017-03-12 DIAGNOSIS — I214 Non-ST elevation (NSTEMI) myocardial infarction: Secondary | ICD-10-CM

## 2017-03-12 NOTE — Progress Notes (Signed)
Cardiac Individual Treatment Plan  Patient Details  Name: Jenna Young MRN: [id number] Date of Birth: Feb 05, 1932 Referring Provider:     Cardiac Rehab from 02/20/2017 in [facility] Cardiac and Pulmonary Rehab  Referring Provider  End      Initial Encounter Date:    Cardiac Rehab from 02/20/2017 in [facility] Cardiac and Pulmonary Rehab  Date  02/20/17  Referring Provider  End      Visit Diagnosis: NSTEMI (non-ST elevated myocardial infarction) [facility])  Patient's Home Medications on Admission:  Current Outpatient Medications:  .  aspirin (ASPIRIN EC) 81 MG EC tablet, Take 81 mg by mouth daily. Swallow whole. , Disp: , Rfl:  .  atorvastatin (LIPITOR) 40 MG tablet, Take 1 tablet (40 mg total) by mouth daily at 6 PM., Disp: 30 tablet, Rfl: 2 .  Bromfenac Sodium 0.07 % SOLN, Place 1 drop into the right eye at bedtime., Disp: , Rfl:  .  Cholecalciferol (VITAMIN D) 2000 UNITS CAPS, Take 1 capsule by mouth daily.  , Disp: , Rfl:  .  clopidogrel (PLAVIX) 75 MG tablet, Take 1 tablet (75 mg total) by mouth daily with breakfast., Disp: 30 tablet, Rfl: 2 .  cyanocobalamin (,VITAMIN B-12,) 1000 MCG/ML injection, Inject 1,000 mcg into the muscle every 3 (three) months., Disp: , Rfl:  .  isosorbide mononitrate (IMDUR) 30 MG 24 hr tablet, Take 0.5 tablets (15 mg total) by mouth daily., Disp: 15 tablet, Rfl: 0 .  metoprolol succinate (TOPROL-XL) 25 MG 24 hr tablet, Take 1 tablet (25 mg total) by mouth daily. Take with or immediately following a meal., Disp: 90 tablet, Rfl: 3 .  nitroGLYCERIN (NITROSTAT) 0.4 MG SL tablet, Place 1 tablet (0.4 mg total) under the tongue every 5 (five) minutes x 3 doses as needed for chest pain., Disp: 30 tablet, Rfl: 2 .  omeprazole (PRILOSEC) 20 MG capsule, Take 1 capsule (20 mg total) by mouth daily. (Patient taking differently: Take 20 mg by mouth daily as needed (acid reflux). ), Disp: 30 capsule, Rfl: 5  Past Medical History: Past Medical History:  Diagnosis Date  . Allergic  rhinitis   . Anemia   . Anxiety   . GERD (gastroesophageal reflux disease)   . History of radiation therapy 04/29/16- 05/24/16   Left Breast 40.05 Gy in 15 fractions, Left Breast boost 10 Gy in 5 fractions.   . Hyperlipidemia   . Hypertension    treated by Dr Thurnell Garbe recently with syncope episode  . Osteopenia   . Osteoporosis     Tobacco Use: Social History   Tobacco Use  Smoking Status Former Smoker  . Last attempt to quit: 03/11/1968  . Years since quitting: 49.0  Smokeless Tobacco Never Used    Labs: Recent Chemical engineer    Labs for ITP Cardiac and Pulmonary Rehab Latest Ref Rng & Units 01/27/2013 02/21/2014 03/17/2015 03/21/2016 02/11/2017   Cholestrol 0 - 200 mg/dL 207(H) 221(H) 200 205(H) 173   LDLCALC 0 - 99 mg/dL - 137(H) 115(H) 125(H) 100(H)   LDLDIRECT mg/dL 116.6 - - - -   HDL >40 mg/dL 59.80 48.60 51.50 56.50 53   Trlycerides <150 mg/dL 123.0 179.0(H) 167.0(H) 117.0 98   Hemoglobin A1c 4.6 - 6.1 % - - - - -   TCO2 0 - 100 mmol/L - - - - -       Exercise Target Goals:    Exercise Program Goal: Individual exercise prescription set with THRR, safety & activity barriers. Participant demonstrates ability to  understand and report RPE using BORG scale, to self-measure pulse accurately, and to acknowledge the importance of the exercise prescription.  Exercise Prescription Goal: Starting with aerobic activity 30 plus minutes a day, 3 days per week for initial exercise prescription. Provide home exercise prescription and guidelines that participant acknowledges understanding prior to discharge.  Activity Barriers & Risk Stratification: Activity Barriers & Cardiac Risk Stratification - 02/20/17 1424      Activity Barriers & Cardiac Risk Stratification   Activity Barriers  Back Problems History of compression fracture.  Exercise helps.    Cardiac Risk Stratification  High       6 Minute Walk: 6 Minute Walk    Row Name 02/20/17 1445         6 Minute  Walk   Distance  780 feet     Walk Time  6 minutes     # of Rest Breaks  0     MPH  1.5     METS  1.5     RPE  11     Perceived Dyspnea   0     VO2 Peak  5.2     Symptoms  No     Resting HR  79 bpm     Resting BP  136/58     Resting Oxygen Saturation   8 %     Exercise Oxygen Saturation  during 6 min walk  100 %     Max Ex. HR  102 bpm     Max Ex. BP  148/50     2 Minute Post BP  130/52        Oxygen Initial Assessment:   Oxygen Re-Evaluation:   Oxygen Discharge (Final Oxygen Re-Evaluation):   Initial Exercise Prescription: Initial Exercise Prescription - 02/20/17 1400      Date of Initial Exercise RX and Referring Provider   Date  02/20/17    Referring Provider  End      Treadmill   MPH  1    Grade  0    Minutes  15    METs  1.77      NuStep   Level  1    SPM  80    Minutes  15    METs  1.5      Biostep-RELP   Level  1    SPM  50    Minutes  15    METs  1.5      Prescription Details   Frequency (times per week)  3    Duration  Progress to 45 minutes of aerobic exercise without signs/symptoms of physical distress      Intensity   THRR 40-80% of Max Heartrate  102-125    Ratings of Perceived Exertion  11-13    Perceived Dyspnea  0-4      Resistance Training   Training Prescription  Yes    Weight  2 lb    Reps  10-15       Perform Capillary Blood Glucose checks as needed.  Exercise Prescription Changes: Exercise Prescription Changes    Row Name 02/20/17 1400             Response to Exercise   Blood Pressure (Admit)  135/58       Blood Pressure (Exercise)  148/50       Blood Pressure (Exit)  130/52       Heart Rate (Admit)  79 bpm       Heart Rate (Exercise)  102 bpm       Heart Rate (Exit)  73 bpm       Oxygen Saturation (Admit)  98 %       Oxygen Saturation (Exit)  100 %       Rating of Perceived Exertion (Exercise)  11          Exercise Comments: Exercise Comments    Row Name 03/06/17 1203           Exercise Comments   First full day of exercise!  Patient was oriented to gym and equipment including functions, settings, policies, and procedures.  Patient's individual exercise prescription and treatment plan were reviewed.  All starting workloads were established based on the results of the 6 minute walk test done at initial orientation visit.  The plan for exercise progression was also introduced and progression will be customized based on patient's performance and goals.          Exercise Goals and Review: Exercise Goals    Row Name 02/20/17 1444             Exercise Goals   Increase Physical Activity  Yes       Intervention  Provide advice, education, support and counseling about physical activity/exercise needs.;Develop an individualized exercise prescription for aerobic and resistive training based on initial evaluation findings, risk stratification, comorbidities and participant's personal goals.       Expected Outcomes  Achievement of increased cardiorespiratory fitness and enhanced flexibility, muscular endurance and strength shown through measurements of functional capacity and personal statement of participant.       Increase Strength and Stamina  Yes       Intervention  Provide advice, education, support and counseling about physical activity/exercise needs.;Develop an individualized exercise prescription for aerobic and resistive training based on initial evaluation findings, risk stratification, comorbidities and participant's personal goals.       Expected Outcomes  Achievement of increased cardiorespiratory fitness and enhanced flexibility, muscular endurance and strength shown through measurements of functional capacity and personal statement of participant.       Able to understand and use rate of perceived exertion (RPE) scale  Yes       Intervention  Provide education and explanation on how to use RPE scale       Expected Outcomes  Short Term: Able to use RPE daily in rehab to express  subjective intensity level;Long Term:  Able to use RPE to guide intensity level when exercising independently       Able to understand and use Dyspnea scale  Yes       Intervention  Provide education and explanation on how to use Dyspnea scale       Expected Outcomes  Short Term: Able to use Dyspnea scale daily in rehab to express subjective sense of shortness of breath during exertion;Long Term: Able to use Dyspnea scale to guide intensity level when exercising independently       Knowledge and understanding of Target Heart Rate Range (THRR)  Yes       Intervention  Provide education and explanation of THRR including how the numbers were predicted and where they are located for reference       Expected Outcomes  Short Term: Able to state/look up THRR;Long Term: Able to use THRR to govern intensity when exercising independently;Short Term: Able to use daily as guideline for intensity in rehab       Able to check pulse independently  Yes  Intervention  Provide education and demonstration on how to check pulse in carotid and radial arteries.;Review the importance of being able to check your own pulse for safety during independent exercise       Expected Outcomes  Short Term: Able to explain why pulse checking is important during independent exercise;Long Term: Able to check pulse independently and accurately       Understanding of Exercise Prescription  Yes       Intervention  Provide education, explanation, and written materials on patient's individual exercise prescription       Expected Outcomes  Short Term: Able to explain program exercise prescription;Long Term: Able to explain home exercise prescription to exercise independently          Exercise Goals Re-Evaluation : Exercise Goals Re-Evaluation    Row Name 03/06/17 1204             Exercise Goal Re-Evaluation   Exercise Goals Review  Understanding of Exercise Prescription;Knowledge and understanding of Target Heart Rate Range  (THRR);Able to understand and use rate of perceived exertion (RPE) scale       Comments  Reviewed RPE scale, THR and program prescription with pt today.  Pt voiced understanding and was given a copy of goals to take home.        Expected Outcomes  Short: Use RPE daily to regulate intensity.  Long: Follow program prescription in THR.          Discharge Exercise Prescription (Final Exercise Prescription Changes): Exercise Prescription Changes - 02/20/17 1400      Response to Exercise   Blood Pressure (Admit)  135/58    Blood Pressure (Exercise)  148/50    Blood Pressure (Exit)  130/52    Heart Rate (Admit)  79 bpm    Heart Rate (Exercise)  102 bpm    Heart Rate (Exit)  73 bpm    Oxygen Saturation (Admit)  98 %    Oxygen Saturation (Exit)  100 %    Rating of Perceived Exertion (Exercise)  11       Nutrition:  Target Goals: Understanding of nutrition guidelines, daily intake of sodium 1500mg , cholesterol 200mg , calories 30% from fat and 7% or less from saturated fats, daily to have 5 or more servings of fruits and vegetables.  Biometrics: Pre Biometrics - 02/20/17 1444      Pre Biometrics   Height  5' 2.5" (1.588 m)    Weight  138 lb 1.6 oz (62.6 kg)    Waist Circumference  32.5 inches    Hip Circumference  39 inches    Waist to Hip Ratio  0.83 %    BMI (Calculated)  24.84    Single Leg Stand  2.45 seconds        Nutrition Therapy Plan and Nutrition Goals: Nutrition Therapy & Goals - 02/20/17 1426      Intervention Plan   Intervention  Prescribe, educate and counsel regarding individualized specific dietary modifications aiming towards targeted core components such as weight, hypertension, lipid management, diabetes, heart failure and other comorbidities.    Expected Outcomes  Short Term Goal: Understand basic principles of dietary content, such as calories, fat, sodium, cholesterol and nutrients.;Short Term Goal: A plan has been developed with personal nutrition goals set  during dietitian appointment.;Long Term Goal: Adherence to prescribed nutrition plan.       Nutrition Discharge: Rate Your Plate Scores: Nutrition Assessments - 02/20/17 1426      MEDFICTS Scores   Pre Score  24       Nutrition Goals Re-Evaluation:   Nutrition Goals Discharge (Final Nutrition Goals Re-Evaluation):   Psychosocial: Target Goals: Acknowledge presence or absence of significant depression and/or stress, maximize coping skills, provide positive support system. Participant is able to verbalize types and ability to use techniques and skills needed for reducing stress and depression.   Initial Review & Psychosocial Screening: Initial Psych Review & Screening - 02/20/17 1427      Initial Review   Current issues with  None Identified      Family Dynamics   Good Support System?  Yes Daughter lives in [place], son      Barriers   Psychosocial barriers to participate in program  There are no identifiable barriers or psychosocial needs.;The patient should benefit from training in stress management and relaxation.      Screening Interventions   Interventions  Yes;To provide support and resources with identified psychosocial needs;Provide feedback about the scores to participant;Encouraged to exercise    Expected Outcomes  Short Term goal: Utilizing psychosocial counselor, staff and physician to assist with identification of specific Stressors or current issues interfering with healing process. Setting desired goal for each stressor or current issue identified.;Long Term Goal: Stressors or current issues are controlled or eliminated.;Short Term goal: Identification and review with participant of any Quality of Life or Depression concerns found by scoring the questionnaire.;Long Term goal: The participant improves quality of Life and PHQ9 Scores as seen by post scores and/or verbalization of changes       Quality of Life Scores:  Quality of Life - 02/20/17 1429      Quality  of Life Scores   Health/Function Pre  28.5 %    Socioeconomic Pre  30 %    Psych/Spiritual Pre  29.14 %    Family Pre  30 %    GLOBAL Pre  29.17 %       PHQ-9: Recent Review Flowsheet Data    Depression screen [facility] 2/9 02/20/2017 06/28/2016 04/17/2016 03/21/2016 03/27/2015   Decreased Interest 0 0 0 0 0   Down, Depressed, Hopeless 0 0 0 0 0   PHQ - 2 Score 0 0 0 0 0   Altered sleeping 0 - - - -   Tired, decreased energy 1 - - - -   Change in appetite 0 - - - -   Feeling bad or failure about yourself  0 - - - -   Trouble concentrating 0 - - - -   Moving slowly or fidgety/restless 0 - - - -   Suicidal thoughts 0 - - - -   PHQ-9 Score 1 - - - -   Difficult doing work/chores Not difficult at all - - - -     Interpretation of Total Score  Total Score Depression Severity:  1-4 = Minimal depression, 5-9 = Mild depression, 10-14 = Moderate depression, 15-19 = Moderately severe depression, 20-27 = Severe depression   Psychosocial Evaluation and Intervention:   Psychosocial Re-Evaluation:   Psychosocial Discharge (Final Psychosocial Re-Evaluation):   Vocational Rehabilitation: Provide vocational rehab assistance to qualifying candidates.   Vocational Rehab Evaluation & Intervention: Vocational Rehab - 02/20/17 1434      Initial Vocational Rehab Evaluation & Intervention   Assessment shows need for Vocational Rehabilitation  No       Education: Education Goals: Education classes will be provided on a variety of topics geared toward better understanding of heart health and risk factor modification. Participant will  state understanding/return demonstration of topics presented as noted by education test scores.  Learning Barriers/Preferences: Learning Barriers/Preferences - 02/20/17 1433      Learning Barriers/Preferences   Learning Barriers  Hearing    Learning Preferences  None       Education Topics: General Nutrition Guidelines/Fats and Fiber: -Group instruction  provided by verbal, written material, models and posters to present the general guidelines for heart healthy nutrition. Gives an explanation and review of dietary fats and fiber.   Controlling Sodium/Reading Food Labels: -Group verbal and written material supporting the discussion of sodium use in heart healthy nutrition. Review and explanation with models, verbal and written materials for utilization of the food label.   Exercise Physiology & Risk Factors: - Group verbal and written instruction with models to review the exercise physiology of the cardiovascular system and associated critical values. Details cardiovascular disease risk factors and the goals associated with each risk factor.   Cardiac Rehab from 03/06/2017 in [facility] Cardiac and Pulmonary Rehab  Date  03/06/17  Educator  [facility]  Instruction Review Code  1- Verbalizes Understanding      Aerobic Exercise & Resistance Training: - Gives group verbal and written discussion on the health impact of inactivity. On the components of aerobic and resistive training programs and the benefits of this training and how to safely progress through these programs.   Flexibility, Balance, General Exercise Guidelines: - Provides group verbal and written instruction on the benefits of flexibility and balance training programs. Provides general exercise guidelines with specific guidelines to those with heart or lung disease. Demonstration and skill practice provided.   Stress Management: - Provides group verbal and written instruction about the health risks of elevated stress, cause of high stress, and healthy ways to reduce stress.   Depression: - Provides group verbal and written instruction on the correlation between heart/lung disease and depressed mood, treatment options, and the stigmas associated with seeking treatment.   Anatomy & Physiology of the Heart: - Group verbal and written instruction and models provide basic cardiac anatomy and  physiology, with the coronary electrical and arterial systems. Review of: AMI, Angina, Valve disease, Heart Failure, Cardiac Arrhythmia, Pacemakers, and the ICD.   Cardiac Procedures: - Group verbal and written instruction to review commonly prescribed medications for heart disease. Reviews the medication, class of the drug, and side effects. Includes the steps to properly store meds and maintain the prescription regimen. (beta blockers and nitrates)   Cardiac Medications I: - Group verbal and written instruction to review commonly prescribed medications for heart disease. Reviews the medication, class of the drug, and side effects. Includes the steps to properly store meds and maintain the prescription regimen.   Cardiac Medications II: -Group verbal and written instruction to review commonly prescribed medications for heart disease. Reviews the medication, class of the drug, and side effects. (all other drug classes)    Go Sex-Intimacy & Heart Disease, Get SMART - Goal Setting: - Group verbal and written instruction through game format to discuss heart disease and the return to sexual intimacy. Provides group verbal and written material to discuss and apply goal setting through the application of the S.M.A.R.T. Method.   Other Matters of the Heart: - Provides group verbal, written materials and models to describe Heart Failure, Angina, Valve Disease, Peripheral Artery Disease, and Diabetes in the realm of heart disease. Includes description of the disease process and treatment options available to the cardiac patient.   Exercise & Equipment Safety: - Individual verbal  instruction and demonstration of equipment use and safety with use of the equipment.   Cardiac Rehab from 03/06/2017 in [facility] Cardiac and Pulmonary Rehab  Date  02/20/17  Educator  [facility]  Instruction Review Code  1- Verbalizes Understanding      Infection Prevention: - Provides verbal and written material to individual  with discussion of infection control including proper hand washing and proper equipment cleaning during exercise session.   Cardiac Rehab from 03/06/2017 in [facility] Cardiac and Pulmonary Rehab  Date  02/20/17  Educator  [facility]  Instruction Review Code  1- Verbalizes Understanding      Falls Prevention: - Provides verbal and written material to individual with discussion of falls prevention and safety.   Cardiac Rehab from 03/06/2017 in [facility] Cardiac and Pulmonary Rehab  Date  02/20/17  Educator  [facility]  Instruction Review Code  1- Verbalizes Understanding      Diabetes: - Individual verbal and written instruction to review signs/symptoms of diabetes, desired ranges of glucose level fasting, after meals and with exercise. Acknowledge that pre and post exercise glucose checks will be done for 3 sessions at entry of program.   Other: -Provides group and verbal instruction on various topics (see comments)    Knowledge Questionnaire Score: Knowledge Questionnaire Score - 02/20/17 1433      Knowledge Questionnaire Score   Pre Score  20/28 Correct responses reviewed with Inez Catalina. She verbalized understanding of the responses and had no questions today.       Core Components/Risk Factors/Patient Goals at Admission: Personal Goals and Risk Factors at Admission - 02/20/17 1426      Core Components/Risk Factors/Patient Goals on Admission   Hypertension  Yes    Intervention  Provide education on lifestyle modifcations including regular physical activity/exercise, weight management, moderate sodium restriction and increased consumption of fresh fruit, vegetables, and low fat dairy, alcohol moderation, and smoking cessation.;Monitor prescription use compliance.    Expected Outcomes  Short Term: Continued assessment and intervention until BP is < 140/30mm HG in hypertensive participants. < 130/69mm HG in hypertensive participants with diabetes, heart failure or chronic kidney disease.;Long Term:  Maintenance of blood pressure at goal levels.    Lipids  Yes    Intervention  Provide education and support for participant on nutrition & aerobic/resistive exercise along with prescribed medications to achieve LDL 70mg , HDL >40mg .    Expected Outcomes  Short Term: Participant states understanding of desired cholesterol values and is compliant with medications prescribed. Participant is following exercise prescription and nutrition guidelines.;Long Term: Cholesterol controlled with medications as prescribed, with individualized exercise RX and with personalized nutrition plan. Value goals: LDL < 70mg , HDL > 40 mg.       Core Components/Risk Factors/Patient Goals Review:    Core Components/Risk Factors/Patient Goals at Discharge (Final Review):    ITP Comments: ITP Comments    Row Name 02/20/17 1417 03/12/17 0650         ITP Comments  Medical review completed today. ITP sent to medical director for review, changes as needed and signature.  Documentation of the diagnosis  can be found in [facility] ADmission 02/10/2017  30 day review. Continue with ITP unless directed changes per Medical Director review.          Comments:

## 2017-03-13 ENCOUNTER — Encounter: Payer: Medicare Other | Attending: Internal Medicine

## 2017-03-13 DIAGNOSIS — M810 Age-related osteoporosis without current pathological fracture: Secondary | ICD-10-CM | POA: Insufficient documentation

## 2017-03-13 DIAGNOSIS — Z923 Personal history of irradiation: Secondary | ICD-10-CM | POA: Diagnosis not present

## 2017-03-13 DIAGNOSIS — Z79899 Other long term (current) drug therapy: Secondary | ICD-10-CM | POA: Insufficient documentation

## 2017-03-13 DIAGNOSIS — I252 Old myocardial infarction: Secondary | ICD-10-CM | POA: Insufficient documentation

## 2017-03-13 DIAGNOSIS — E785 Hyperlipidemia, unspecified: Secondary | ICD-10-CM | POA: Insufficient documentation

## 2017-03-13 DIAGNOSIS — K219 Gastro-esophageal reflux disease without esophagitis: Secondary | ICD-10-CM | POA: Insufficient documentation

## 2017-03-13 DIAGNOSIS — I10 Essential (primary) hypertension: Secondary | ICD-10-CM | POA: Insufficient documentation

## 2017-03-13 DIAGNOSIS — Z87891 Personal history of nicotine dependence: Secondary | ICD-10-CM | POA: Insufficient documentation

## 2017-03-13 DIAGNOSIS — Z7902 Long term (current) use of antithrombotics/antiplatelets: Secondary | ICD-10-CM | POA: Diagnosis not present

## 2017-03-13 DIAGNOSIS — I214 Non-ST elevation (NSTEMI) myocardial infarction: Secondary | ICD-10-CM

## 2017-03-13 DIAGNOSIS — Z7982 Long term (current) use of aspirin: Secondary | ICD-10-CM | POA: Insufficient documentation

## 2017-03-13 NOTE — Progress Notes (Signed)
Daily Session Note  Patient Details  Name: Jenna Young MRN: [id number] Date of Birth: Sep 07, 1931 Referring Provider:     Cardiac Rehab from 02/20/2017 in [facility] Cardiac and Pulmonary Rehab  Referring Provider  End      Encounter Date: 03/13/2017  Check In: Session Check In - 03/13/17 0836      Check-In   Location  [facility]-Cardiac & Pulmonary Rehab    Staff Present  Nada Maclachlan, BA, ACSM CEP, Exercise Physiologist;Carroll Enterkin, RN, Levie Heritage, MA, ACSM RCEP, Exercise Physiologist    Supervising physician immediately available to respond to emergencies  See telemetry face sheet for immediately available ER MD    Medication changes reported      No    Fall or balance concerns reported     No    Warm-up and Cool-down  Performed on first and last piece of equipment    Resistance Training Performed  Yes    VAD Patient?  No      Pain Assessment   Currently in Pain?  No/denies    Multiple Pain Sites  No          Social History   Tobacco Use  Smoking Status Former Smoker  . Last attempt to quit: 03/11/1968  . Years since quitting: 49.0  Smokeless Tobacco Never Used    Goals Met:  Independence with exercise equipment Exercise tolerated well No report of cardiac concerns or symptoms Strength training completed today  Goals Unmet:  Not Applicable  Comments: Pt able to follow exercise prescription today without complaint.  Will continue to monitor for progression.    Dr. Emily Filbert is Medical Director for [place] and [facility].

## 2017-03-18 ENCOUNTER — Encounter: Payer: Medicare Other | Admitting: *Deleted

## 2017-03-18 DIAGNOSIS — Z7902 Long term (current) use of antithrombotics/antiplatelets: Secondary | ICD-10-CM | POA: Diagnosis not present

## 2017-03-18 DIAGNOSIS — Z923 Personal history of irradiation: Secondary | ICD-10-CM | POA: Diagnosis not present

## 2017-03-18 DIAGNOSIS — K219 Gastro-esophageal reflux disease without esophagitis: Secondary | ICD-10-CM | POA: Diagnosis not present

## 2017-03-18 DIAGNOSIS — Z79899 Other long term (current) drug therapy: Secondary | ICD-10-CM | POA: Diagnosis not present

## 2017-03-18 DIAGNOSIS — Z7982 Long term (current) use of aspirin: Secondary | ICD-10-CM | POA: Diagnosis not present

## 2017-03-18 DIAGNOSIS — I214 Non-ST elevation (NSTEMI) myocardial infarction: Secondary | ICD-10-CM

## 2017-03-18 DIAGNOSIS — I252 Old myocardial infarction: Secondary | ICD-10-CM | POA: Diagnosis not present

## 2017-03-18 NOTE — Progress Notes (Signed)
Daily Session Note  Patient Details  Name: Jenna Young MRN: [id number] Date of Birth: 03/03/1932 Referring Provider:     Cardiac Rehab from 02/20/2017 in [facility] Cardiac and Pulmonary Rehab  Referring Provider  End      Encounter Date: 03/18/2017  Check In: Session Check In - 03/18/17 0827      Check-In   Location  [facility]-Cardiac & Pulmonary Rehab    Staff Present  Nada Maclachlan, BA, ACSM CEP, Exercise Physiologist;Susanne Bice, RN, BSN, CCRP;Joseph Darrin Nipper, [place], ACSM RCEP, Exercise Physiologist    Supervising physician immediately available to respond to emergencies  See telemetry face sheet for immediately available ER MD    Medication changes reported      No    Fall or balance concerns reported     No    Warm-up and Cool-down  Performed on first and last piece of equipment    Resistance Training Performed  Yes    VAD Patient?  No      Pain Assessment   Currently in Pain?  No/denies          Social History   Tobacco Use  Smoking Status Former Smoker  . Last attempt to quit: 03/11/1968  . Years since quitting: 49.0  Smokeless Tobacco Never Used    Goals Met:  Independence with exercise equipment Exercise tolerated well No report of cardiac concerns or symptoms Strength training completed today  Goals Unmet:  Not Applicable  Comments: Pt able to follow exercise prescription today without complaint.  Will continue to monitor for progression.    Dr. Emily Filbert is Medical Director for [place] and [facility].

## 2017-03-18 NOTE — Progress Notes (Signed)
Daily Session Note  Patient Details  Name: Jenna Young MRN: [id number] Date of Birth: [birth year] Referring Provider:     Cardiac Rehab from 02/20/2017 in [facility] Cardiac and Pulmonary Rehab  Referring Provider  End      Encounter Date: 03/18/2017  Check In: Session Check In - 03/18/17 0827      Check-In   Location  [facility]-Cardiac & Pulmonary Rehab    Staff Present  Nada Maclachlan, BA, ACSM CEP, Exercise Physiologist;Susanne Bice, RN, BSN, CCRP;Joseph Darrin Nipper, [place], ACSM RCEP, Exercise Physiologist    Supervising physician immediately available to respond to emergencies  See telemetry face sheet for immediately available ER MD    Medication changes reported      No    Fall or balance concerns reported     No    Warm-up and Cool-down  Performed on first and last piece of equipment    Resistance Training Performed  Yes    VAD Patient?  No      Pain Assessment   Currently in Pain?  No/denies          Social History   Tobacco Use  Smoking Status Former Smoker  . Last attempt to quit: 03/11/1968  . Years since quitting: 49.0  Smokeless Tobacco Never Used    Goals Met:  Independence with exercise equipment Exercise tolerated well No report of cardiac concerns or symptoms Strength training completed today  Goals Unmet:  Not Applicable  Comments: Pt able to follow exercise prescription today without complaint.  Will continue to monitor for progression.    Dr. Emily Filbert is Medical Director for [place] and [facility].

## 2017-03-20 DIAGNOSIS — I252 Old myocardial infarction: Secondary | ICD-10-CM | POA: Diagnosis not present

## 2017-03-20 DIAGNOSIS — Z923 Personal history of irradiation: Secondary | ICD-10-CM | POA: Diagnosis not present

## 2017-03-20 DIAGNOSIS — I214 Non-ST elevation (NSTEMI) myocardial infarction: Secondary | ICD-10-CM

## 2017-03-20 DIAGNOSIS — K219 Gastro-esophageal reflux disease without esophagitis: Secondary | ICD-10-CM | POA: Diagnosis not present

## 2017-03-20 DIAGNOSIS — Z79899 Other long term (current) drug therapy: Secondary | ICD-10-CM | POA: Diagnosis not present

## 2017-03-20 DIAGNOSIS — Z7982 Long term (current) use of aspirin: Secondary | ICD-10-CM | POA: Diagnosis not present

## 2017-03-20 DIAGNOSIS — Z7902 Long term (current) use of antithrombotics/antiplatelets: Secondary | ICD-10-CM | POA: Diagnosis not present

## 2017-03-20 NOTE — Progress Notes (Signed)
Daily Session Note  Patient Details  Name: Jenna Young MRN: [id number] Date of Birth: October 23, 1931 Referring Provider:     Cardiac Rehab from 02/20/2017 in [facility] Cardiac and Pulmonary Rehab  Referring Provider  End      Encounter Date: 03/20/2017  Check In: Session Check In - 03/20/17 0829      Check-In   Location  [facility]-Cardiac & Pulmonary Rehab    Staff Present  Nada Maclachlan, BA, ACSM CEP, Exercise Physiologist;Carroll Enterkin, RN, Levie Heritage, MA, ACSM RCEP, Exercise Physiologist    Supervising physician immediately available to respond to emergencies  See telemetry face sheet for immediately available ER MD    Medication changes reported      No    Fall or balance concerns reported     No    Warm-up and Cool-down  Performed on first and last piece of equipment    Resistance Training Performed  Yes    VAD Patient?  No      Pain Assessment   Currently in Pain?  No/denies    Multiple Pain Sites  No          Social History   Tobacco Use  Smoking Status Former Smoker  . Last attempt to quit: 03/11/1968  . Years since quitting: 49.0  Smokeless Tobacco Never Used    Goals Met:  Independence with exercise equipment Exercise tolerated well No report of cardiac concerns or symptoms Strength training completed today  Goals Unmet:  Not Applicable  Comments: Pt able to follow exercise prescription today without complaint.  Will continue to monitor for progression.    Dr. Emily Filbert is Medical Director for [place] and [facility].

## 2017-03-23 ENCOUNTER — Telehealth: Payer: Self-pay | Admitting: Family Medicine

## 2017-03-23 DIAGNOSIS — E538 Deficiency of other specified B group vitamins: Secondary | ICD-10-CM

## 2017-03-23 DIAGNOSIS — I10 Essential (primary) hypertension: Secondary | ICD-10-CM

## 2017-03-23 DIAGNOSIS — E559 Vitamin D deficiency, unspecified: Secondary | ICD-10-CM

## 2017-03-23 DIAGNOSIS — E782 Mixed hyperlipidemia: Secondary | ICD-10-CM

## 2017-03-23 NOTE — Telephone Encounter (Signed)
-----   Message from Eustace Pen, LPN sent at 09/09/6376  5:02 PM EST ----- Regarding: Labs 1/18 Lab orders needed. Thank you.  Insurance:  Medicare - traditional

## 2017-03-24 DIAGNOSIS — H2512 Age-related nuclear cataract, left eye: Secondary | ICD-10-CM | POA: Diagnosis not present

## 2017-03-27 ENCOUNTER — Ambulatory Visit: Payer: Medicare Other

## 2017-03-31 ENCOUNTER — Ambulatory Visit (INDEPENDENT_AMBULATORY_CARE_PROVIDER_SITE_OTHER): Payer: Medicare Other

## 2017-03-31 VITALS — BP 130/60 | HR 92 | Temp 97.9°F | Ht 61.0 in | Wt 136.2 lb

## 2017-03-31 DIAGNOSIS — E559 Vitamin D deficiency, unspecified: Secondary | ICD-10-CM | POA: Diagnosis not present

## 2017-03-31 DIAGNOSIS — E538 Deficiency of other specified B group vitamins: Secondary | ICD-10-CM | POA: Diagnosis not present

## 2017-03-31 DIAGNOSIS — Z0000 Encounter for general adult medical examination without abnormal findings: Secondary | ICD-10-CM | POA: Diagnosis not present

## 2017-03-31 DIAGNOSIS — I10 Essential (primary) hypertension: Secondary | ICD-10-CM | POA: Diagnosis not present

## 2017-03-31 DIAGNOSIS — H2512 Age-related nuclear cataract, left eye: Secondary | ICD-10-CM | POA: Diagnosis not present

## 2017-03-31 DIAGNOSIS — H52223 Regular astigmatism, bilateral: Secondary | ICD-10-CM | POA: Diagnosis not present

## 2017-03-31 DIAGNOSIS — Z961 Presence of intraocular lens: Secondary | ICD-10-CM | POA: Diagnosis not present

## 2017-03-31 DIAGNOSIS — H2511 Age-related nuclear cataract, right eye: Secondary | ICD-10-CM | POA: Diagnosis not present

## 2017-03-31 DIAGNOSIS — H5203 Hypermetropia, bilateral: Secondary | ICD-10-CM | POA: Diagnosis not present

## 2017-03-31 DIAGNOSIS — E782 Mixed hyperlipidemia: Secondary | ICD-10-CM

## 2017-03-31 LAB — COMPREHENSIVE METABOLIC PANEL
ALT: 11 U/L (ref 0–35)
AST: 15 U/L (ref 0–37)
Albumin: 3.8 g/dL (ref 3.5–5.2)
Alkaline Phosphatase: 77 U/L (ref 39–117)
BUN: 10 mg/dL (ref 6–23)
CO2: 28 mEq/L (ref 19–32)
Calcium: 9.3 mg/dL (ref 8.4–10.5)
Chloride: 103 mEq/L (ref 96–112)
Creatinine, Ser: 0.85 mg/dL (ref 0.40–1.20)
GFR: 67.85 mL/min (ref >60.00)
Glucose, Bld: 96 mg/dL (ref 70–99)
Potassium: 4.2 mEq/L (ref 3.5–5.1)
Sodium: 138 mEq/L (ref 135–145)
Total Bilirubin: 0.6 mg/dL (ref 0.2–1.2)
Total Protein: 7.5 g/dL (ref 6.0–8.3)

## 2017-03-31 LAB — CBC WITH DIFFERENTIAL/PLATELET
Basophils Absolute: 0 10*3/uL (ref 0.0–0.1)
Basophils Relative: 0.6 % (ref 0.0–3.0)
Eosinophils Absolute: 0.2 10*3/uL (ref 0.0–0.7)
Eosinophils Relative: 4 % (ref 0.0–5.0)
HCT: 31.9 % — ABNORMAL LOW (ref 36.0–46.0)
Hemoglobin: 10.5 g/dL — ABNORMAL LOW (ref 12.0–15.0)
Lymphocytes Relative: 34.7 % (ref 12.0–46.0)
Lymphs Abs: 2.1 10*3/uL (ref 0.7–4.0)
MCHC: 33 g/dL (ref 30.0–36.0)
MCV: 81.9 fl (ref 78.0–100.0)
Monocytes Absolute: 0.5 10*3/uL (ref 0.1–1.0)
Monocytes Relative: 8.1 % (ref 3.0–12.0)
Neutro Abs: 3.1 10*3/uL (ref 1.4–7.7)
Neutrophils Relative %: 52.6 % (ref 43.0–77.0)
Platelets: 310 10*3/uL (ref 150.0–400.0)
RBC: 3.89 Mil/uL (ref 3.87–5.11)
RDW: 15.9 % — ABNORMAL HIGH (ref 11.5–15.5)
WBC: 6 10*3/uL (ref 4.0–10.5)

## 2017-03-31 LAB — VITAMIN D 25 HYDROXY (VIT D DEFICIENCY, FRACTURES): VITD: 58.37 ng/mL (ref 30.00–100.00)

## 2017-03-31 LAB — LIPID PANEL
Cholesterol: 125 mg/dL (ref 0–200)
HDL: 50.5 mg/dL (ref >39.00)
LDL Cholesterol: 56 mg/dL (ref 0–99)
NonHDL: 74.54
Total CHOL/HDL Ratio: 2
Triglycerides: 92 mg/dL (ref 0.0–149.0)
VLDL: 18.4 mg/dL (ref 0.0–40.0)

## 2017-03-31 LAB — TSH: TSH: 1.19 u[IU]/mL (ref 0.35–4.50)

## 2017-03-31 LAB — VITAMIN B12: Vitamin B-12: 333 pg/mL (ref 211–911)

## 2017-03-31 MED ORDER — CYANOCOBALAMIN 1000 MCG/ML IJ SOLN
1000.0000 ug | Freq: Once | INTRAMUSCULAR | Status: AC
  Administered 2017-03-31: 1000 ug via INTRAMUSCULAR

## 2017-03-31 NOTE — Progress Notes (Signed)
PCP notes:   Health maintenance:  Mammogram - per pt, screening in Nov 2018  Abnormal screenings:   Hearing - failed (right ear)  Hearing Screening   125Hz  250Hz  500Hz  1000Hz  2000Hz  3000Hz  4000Hz  6000Hz  8000Hz   Right ear:   0 0 0  0    Left ear:           Comments: Hearing aid - left ear only  Patient concerns:   None  Nurse concerns:  PCP please discuss BP medication with patient at next appt. Pt's BP was slightly elevated during visit. States she does not Toprol-XL as prescribed.  Next PCP appt:   04/01/17 @ 1430  I reviewed health advisor's note, was available for consultation, and agree with documentation and plan. Loura Pardon MD

## 2017-03-31 NOTE — Progress Notes (Signed)
Pre visit review using our clinic review tool, if applicable. No additional management support is needed unless otherwise documented below in the visit note. 

## 2017-03-31 NOTE — Patient Instructions (Signed)
Jenna Young , Thank you for taking time to come for your Medicare Wellness Visit. I appreciate your ongoing commitment to your health goals. Please review the following plan we discussed and let me know if I can assist you in the future.   These are the goals we discussed: Goals    . Increase physical activity     Starting 03/31/2017, I will continue to participate in [facility] as scheduled.        This is a list of the screening recommended for you and due dates:  Health Maintenance  Topic Date Due  . Colon Cancer Screening  03/08/2019*  . DEXA scan (bone density measurement)  03/21/2025*  . Mammogram  01/09/2018  . Tetanus Vaccine  01/09/2018  . Flu Shot  Completed  . Pneumonia vaccines  Completed  *Topic was postponed. The date shown is not the original due date.   Preventive Care for Adults  A healthy lifestyle and preventive care can promote health and wellness. Preventive health guidelines for adults include the following key practices.  . A routine yearly physical is a good way to check with your health care provider about your health and preventive screening. It is a chance to share any concerns and updates on your health and to receive a thorough exam.  . Visit your dentist for a routine exam and preventive care every 6 months. Brush your teeth twice a day and floss once a day. Good oral hygiene prevents tooth decay and gum disease.  . The frequency of eye exams is based on your age, health, family medical history, use  of contact lenses, and other factors. Follow your health care provider's recommendations for frequency of eye exams.  . Eat a healthy diet. Foods like vegetables, fruits, whole grains, low-fat dairy products, and lean protein foods contain the nutrients you need without too many calories. Decrease your intake of foods high in solid fats, added sugars, and salt. Eat the right amount of calories for you. Get information about a proper diet from your  health care provider, if necessary.  . Regular physical exercise is one of the most important things you can do for your health. Most adults should get at least 150 minutes of moderate-intensity exercise (any activity that increases your heart rate and causes you to sweat) each week. In addition, most adults need muscle-strengthening exercises on 2 or more days a week.  Silver Sneakers may be a benefit available to you. To determine eligibility, you may visit the website: www.silversneakers.com or contact program at 7403474848 Mon-Fri between 8AM-8PM.   . Maintain a healthy weight. The body mass index (BMI) is a screening tool to identify possible weight problems. It provides an estimate of body fat based on height and weight. Your health care provider can find your BMI and can help you achieve or maintain a healthy weight.   For adults 20 years and older: ? A BMI below 18.5 is considered underweight. ? A BMI of 18.5 to 24.9 is normal. ? A BMI of 25 to 29.9 is considered overweight. ? A BMI of 30 and above is considered obese.   . Maintain normal blood lipids and cholesterol levels by exercising and minimizing your intake of saturated fat. Eat a balanced diet with plenty of fruit and vegetables. Blood tests for lipids and cholesterol should begin at age 58 and be repeated every 5 years. If your lipid or cholesterol levels are high, you are over 50, or you are at  high risk for heart disease, you may need your cholesterol levels checked more frequently. Ongoing high lipid and cholesterol levels should be treated with medicines if diet and exercise are not working.  . If you smoke, find out from your health care provider how to quit. If you do not use tobacco, please do not start.  . If you choose to drink alcohol, please do not consume more than 2 drinks per day. One drink is considered to be 12 ounces (355 mL) of beer, 5 ounces (148 mL) of wine, or 1.5 ounces (44 mL) of liquor.  . If you are  64-33 years old, ask your health care provider if you should take aspirin to prevent strokes.  . Use sunscreen. Apply sunscreen liberally and repeatedly throughout the day. You should seek shade when your shadow is shorter than you. Protect yourself by wearing long sleeves, pants, a wide-brimmed hat, and sunglasses year round, whenever you are outdoors.  . Once a month, do a whole body skin exam, using a mirror to look at the skin on your back. Tell your health care provider of new moles, moles that have irregular borders, moles that are larger than a pencil eraser, or moles that have changed in shape or color.

## 2017-03-31 NOTE — Progress Notes (Signed)
Subjective:   Jenna Young is a 84 y.o. female who presents for Medicare Annual (Subsequent) preventive examination.  Review of Systems:  N/A Cardiac Risk Factors include: advanced age (>27men, >35 women);dyslipidemia;hypertension     Objective:     Vitals: BP 130/60 (BP Location: Right Arm, Patient Position: Sitting, Cuff Size: Normal)   Pulse 92   Temp 97.9 F (36.6 C) (Oral)   Ht 5\' 1"  (1.549 m)   Wt 136 lb 4 oz (61.8 kg)   SpO2 96%   BMI 25.74 kg/m   Body mass index is 25.74 kg/m.  Advanced Directives 03/31/2017 03/01/2017 02/20/2017 02/11/2017 02/10/2017 06/28/2016 04/17/2016  Does Patient Have a Medical Advance Directive? No No No - No No No  Type of Advance Directive - - - - - - -  Does patient want to make changes to medical advance directive? - - - - - - No - Patient declined  Would patient like information on creating a medical advance directive? No - Patient declined No - Patient declined No - Patient declined No - Patient declined No - Patient declined No - Patient declined -    Tobacco Social History   Tobacco Use  Smoking Status Former Smoker  . Last attempt to quit: 03/11/1968  . Years since quitting: 49.0  Smokeless Tobacco Never Used     Counseling given: No   Clinical Intake:  Pre-visit preparation completed: Yes  Pain : No/denies pain Pain Score: 0-No pain     Nutritional Status: BMI 25 -29 Overweight Nutritional Risks: None Diabetes: No  How often do you need to have someone help you when you read instructions, pamphlets, or other written materials from your doctor or pharmacy?: 1 - Never What is the last grade level you completed in school?: 12th grade  Interpreter Needed?: No  Comments: pt is a widow and lives alone Information entered by :: LPinson, LPN  Past Medical History:  Diagnosis Date  . Allergic rhinitis   . Anemia   . Anxiety   . GERD (gastroesophageal reflux disease)   . History of radiation therapy 04/29/16- 05/24/16   Left Breast 40.05 Gy in 15 fractions, Left Breast boost 10 Gy in 5 fractions.   . Hyperlipidemia   . Hypertension    treated by Dr Thurnell Garbe recently with syncope episode  . Osteopenia   . Osteoporosis    Past Surgical History:  Procedure Laterality Date  . APPENDECTOMY    . BREAST LUMPECTOMY WITH RADIOACTIVE SEED LOCALIZATION Left 02/27/2016   Procedure: LEFT BREAST LUMPECTOMY WITH RADIOACTIVE SEED LOCALIZATION;  Surgeon: Autumn Messing III, MD;  Location: [facility];  Service: General;  Laterality: Left;  . BREAST SURGERY  10/10   benign biopsy  negative  . CESAREAN SECTION    . CHOLECYSTECTOMY    . CORONARY STENT INTERVENTION N/A 02/11/2017   Procedure: CORONARY STENT INTERVENTION;  Surgeon: Nelva Bush, MD;  Location: [facility];  Service: Cardiovascular;  Laterality: N/A;  . FEMUR FRACTURE SURGERY Right 2006  . LEFT HEART CATH AND CORONARY ANGIOGRAPHY N/A 02/11/2017   Procedure: LEFT HEART CATH AND CORONARY ANGIOGRAPHY;  Surgeon: Nelva Bush, MD;  Location: [place] [facility];  Service: Cardiovascular;  Laterality: N/A;  . OVARIAN CYST SURGERY    . RE-EXCISION OF BREAST LUMPECTOMY Left 04/01/2016   Procedure: RE-EXCISION OF LEFT BREAST MEDIAL MARGIN;  Surgeon: Autumn Messing III, MD;  Location: [place] [facility];  Service: General;  Laterality: Left;   Family  History  Problem Relation Age of Onset  . Hypertension Mother   . Heart disease Father   . Macular degeneration Sister   . Diabetes Maternal Aunt   . Cancer Maternal Aunt        throat   Social History   Socioeconomic History  . Marital status: Widowed    Spouse name: None  . Number of children: 1  . Years of education: None  . Highest education level: None  Social Needs  . Financial resource strain: None  . Food insecurity - worry: None  . Food insecurity - inability: None  . Transportation needs - medical: None  . Transportation needs - non-medical: None  Occupational  History    Employer: RETIRED  Tobacco Use  . Smoking status: Former Smoker    Last attempt to quit: 03/11/1968    Years since quitting: 49.0  . Smokeless tobacco: Never Used  Substance and Sexual Activity  . Alcohol use: No    Alcohol/week: 0.0 oz  . Drug use: No  . Sexual activity: No  Other Topics Concern  . None  Social History Narrative  . None    Outpatient Encounter Medications as of 03/31/2017  Medication Sig  . aspirin (ASPIRIN EC) 81 MG EC tablet Take 81 mg by mouth daily. Swallow whole.   Marland Kitchen atorvastatin (LIPITOR) 40 MG tablet Take 1 tablet (40 mg total) by mouth daily at 6 PM.  . Cholecalciferol (VITAMIN D) 2000 UNITS CAPS Take 1 capsule by mouth daily.    . clopidogrel (PLAVIX) 75 MG tablet Take 1 tablet (75 mg total) by mouth daily with breakfast.  . cyanocobalamin (,VITAMIN B-12,) 1000 MCG/ML injection Inject 1,000 mcg into the muscle every 3 (three) months.  . isosorbide mononitrate (IMDUR) 30 MG 24 hr tablet Take 0.5 tablets (15 mg total) by mouth daily.  . metoprolol succinate (TOPROL-XL) 25 MG 24 hr tablet Take 1 tablet (25 mg total) by mouth daily. Take with or immediately following a meal.  . nitroGLYCERIN (NITROSTAT) 0.4 MG SL tablet Place 1 tablet (0.4 mg total) under the tongue every 5 (five) minutes x 3 doses as needed for chest pain.  Marland Kitchen omeprazole (PRILOSEC) 20 MG capsule Take 1 capsule (20 mg total) by mouth daily. (Patient taking differently: Take 20 mg by mouth daily as needed (acid reflux). )  . [DISCONTINUED] Bromfenac Sodium 0.07 % SOLN Place 1 drop into the right eye at bedtime.  . [EXPIRED] cyanocobalamin ((VITAMIN B-12)) injection 1,000 mcg    No facility-administered encounter medications on file as of 03/31/2017.     Activities of Daily Living In your present state of health, do you have any difficulty performing the following activities: 03/31/2017 03/01/2017  Hearing? Tempie Donning  Vision? N N  Difficulty concentrating or making decisions? N N  Walking  or climbing stairs? N N  Dressing or bathing? N N  Doing errands, shopping? N N  Preparing Food and eating ? N -  Using the Toilet? N -  In the past six months, have you accidently leaked urine? N -  Do you have problems with loss of bowel control? N -  Managing your Medications? N -  Managing your Finances? N -  Housekeeping or managing your Housekeeping? N -  Some recent data might be hidden    Patient Care Team: Tower, [name], MD as PCP - General Rockey Situ, Kathlene November, MD as PCP - Cardiology (Cardiology) Minna Merritts, MD as Consulting Physician (Cardiology) Autumn Messing III,  MD as Consulting Physician (General Surgery) Truitt Merle, MD as Consulting Physician (Hematology) Eppie Gibson, MD as Attending Physician (Radiation Oncology) Gardenia Phlegm, NP as Nurse Practitioner (Hematology and Oncology) Wellington Hampshire, MD as Consulting Physician (Cardiology)    Assessment:   This is a routine wellness examination for Jenna Young.   Hearing Screening   125Hz  250Hz  500Hz  1000Hz  2000Hz  3000Hz  4000Hz  6000Hz  8000Hz   Right ear:   0 0 0  0    Left ear:           Comments: Hearing aid - left ear only  Vision Screening Comments: Last vision exam in Dec 2018 with Dr. Matilde Sprang    Exercise Activities and Dietary recommendations Current Exercise Habits: Structured exercise class, Type of exercise: Other - see comments([facility] @ [place]), Time (Minutes): > 60(120 minutes), Frequency (Times/Week): 2, Weekly Exercise (Minutes/Week): 0, Intensity: Moderate, Exercise limited by: None identified  Goals    . Increase physical activity     Starting 03/31/2017, I will continue to participate in [facility] as scheduled.        Fall Risk Fall Risk  03/31/2017 02/20/2017 06/28/2016 04/17/2016 03/21/2016  Falls in the past year? No No No No No    Depression Screen PHQ 2/9 Scores 03/31/2017 02/20/2017 06/28/2016 04/17/2016  PHQ - 2 Score 0 0 0 0  PHQ- 9 Score 0 1 - -     Cognitive  Function MMSE - Mini Mental State Exam 03/31/2017 03/21/2016  Orientation to time 5 5  Orientation to Place 5 5  Registration 3 3  Attention/ Calculation 0 0  Recall 3 1  Recall-comments - pt was unable to recall 2 of 3 words  Language- name 2 objects 0 0  Language- repeat 1 1  Language- follow 3 step command 3 3  Language- read & follow direction 0 0  Write a sentence 0 0  Copy design 0 0  Total score 20 18     PLEASE NOTE: A Mini-Cog screen was completed. Maximum score is 20. A value of 0 denotes this part of [place] MMSE was not completed or the patient failed this part of the Mini-Cog screening.   Mini-Cog Screening Orientation to Time - Max 5 pts Orientation to Place - Max 5 pts Registration - Max 3 pts Recall - Max 3 pts Language Repeat - Max 1 pts Language Follow 3 Step Command - Max 3 pts     Immunization History  Administered Date(s) Administered  . Influenza Split 12/11/2010, 01/17/2012  . Influenza Whole 01/09/2006, 12/10/2006, 12/08/2007, 12/13/2009  . Influenza,inj,Quad PF,6+ Mos 12/09/2012, 12/15/2013, 12/22/2014, 12/21/2015, 12/24/2016  . Pneumococcal Conjugate-13 02/23/2014  . Pneumococcal Polysaccharide-23 12/11/2010  . Td 01/10/2008    Screening Tests Health Maintenance  Topic Date Due  . COLONOSCOPY  03/08/2019 (Originally 03/08/2016)  . DEXA SCAN  03/21/2025 (Originally 01/11/1999)  . MAMMOGRAM  01/09/2018  . TETANUS/TDAP  01/09/2018  . INFLUENZA VACCINE  Completed  . PNA vac Low Risk Adult  Completed       Plan:      I have personally reviewed, addressed, and noted the following in the patient's chart:  A. Medical and social history B. Use of alcohol, tobacco or illicit drugs  C. Current medications and supplements D. Functional ability and status E.  Nutritional status F.  Physical activity G. Advance directives H. List of other physicians I.  Hospitalizations, surgeries, and ER visits in previous 12 months J.  Vitals K. Screenings  to include hearing,  vision, cognitive, depression L. Referrals and appointments - none  In addition, I have reviewed and discussed with patient certain preventive protocols, quality metrics, and best practice recommendations. A written personalized care plan for preventive services as well as general preventive health recommendations were provided to patient.  See attached scanned questionnaire for additional information.   Signed,   Lindell Noe, MHA, BS, LPN Health Coach

## 2017-04-01 ENCOUNTER — Ambulatory Visit (INDEPENDENT_AMBULATORY_CARE_PROVIDER_SITE_OTHER): Payer: Medicare Other | Admitting: Family Medicine

## 2017-04-01 ENCOUNTER — Encounter: Payer: Self-pay | Admitting: Family Medicine

## 2017-04-01 ENCOUNTER — Ambulatory Visit: Payer: Medicare Other

## 2017-04-01 ENCOUNTER — Encounter: Payer: Medicare Other | Admitting: *Deleted

## 2017-04-01 VITALS — BP 130/68 | HR 87 | Temp 98.0°F | Ht 61.0 in | Wt 136.2 lb

## 2017-04-01 DIAGNOSIS — Z79899 Other long term (current) drug therapy: Secondary | ICD-10-CM | POA: Diagnosis not present

## 2017-04-01 DIAGNOSIS — Z1231 Encounter for screening mammogram for malignant neoplasm of breast: Secondary | ICD-10-CM | POA: Diagnosis not present

## 2017-04-01 DIAGNOSIS — I214 Non-ST elevation (NSTEMI) myocardial infarction: Secondary | ICD-10-CM

## 2017-04-01 DIAGNOSIS — I252 Old myocardial infarction: Secondary | ICD-10-CM | POA: Diagnosis not present

## 2017-04-01 DIAGNOSIS — D649 Anemia, unspecified: Secondary | ICD-10-CM | POA: Diagnosis not present

## 2017-04-01 DIAGNOSIS — E782 Mixed hyperlipidemia: Secondary | ICD-10-CM

## 2017-04-01 DIAGNOSIS — C50112 Malignant neoplasm of central portion of left female breast: Secondary | ICD-10-CM | POA: Diagnosis not present

## 2017-04-01 DIAGNOSIS — K219 Gastro-esophageal reflux disease without esophagitis: Secondary | ICD-10-CM | POA: Diagnosis not present

## 2017-04-01 DIAGNOSIS — Z1211 Encounter for screening for malignant neoplasm of colon: Secondary | ICD-10-CM

## 2017-04-01 DIAGNOSIS — D0512 Intraductal carcinoma in situ of left breast: Secondary | ICD-10-CM | POA: Diagnosis not present

## 2017-04-01 DIAGNOSIS — E538 Deficiency of other specified B group vitamins: Secondary | ICD-10-CM | POA: Diagnosis not present

## 2017-04-01 DIAGNOSIS — M810 Age-related osteoporosis without current pathological fracture: Secondary | ICD-10-CM

## 2017-04-01 DIAGNOSIS — E559 Vitamin D deficiency, unspecified: Secondary | ICD-10-CM

## 2017-04-01 DIAGNOSIS — Z7902 Long term (current) use of antithrombotics/antiplatelets: Secondary | ICD-10-CM | POA: Diagnosis not present

## 2017-04-01 DIAGNOSIS — Z171 Estrogen receptor negative status [ER-]: Secondary | ICD-10-CM | POA: Diagnosis not present

## 2017-04-01 DIAGNOSIS — R600 Localized edema: Secondary | ICD-10-CM | POA: Diagnosis not present

## 2017-04-01 DIAGNOSIS — Z923 Personal history of irradiation: Secondary | ICD-10-CM | POA: Diagnosis not present

## 2017-04-01 DIAGNOSIS — I10 Essential (primary) hypertension: Secondary | ICD-10-CM | POA: Diagnosis not present

## 2017-04-01 DIAGNOSIS — Z7982 Long term (current) use of aspirin: Secondary | ICD-10-CM | POA: Diagnosis not present

## 2017-04-01 NOTE — Assessment & Plan Note (Signed)
Much improved with statin  In setting of CAD/ STEMI Disc goals for lipids and reasons to control them Rev labs with pt Rev low sat fat diet in detail

## 2017-04-01 NOTE — Assessment & Plan Note (Signed)
bp is fairly well controlled with imdur alone  She was unable to tolerate metoprolol  BP: 130/68  Pulse Rate: 87  Continue to monitor Good health habits  Continue cardio rehab and f/u with cardiology as planned for CAD and STEMI

## 2017-04-01 NOTE — Assessment & Plan Note (Signed)
Continues to decline dexa  No falls or fx recent (past leg and spinal comp fx)  foxamax in the past  D level is therapeutic Disc imp of D/ ca and exercise  Disc fall prev

## 2017-04-01 NOTE — Assessment & Plan Note (Signed)
Scheduled annual screening mammogram Nl breast exam today  Encouraged monthly self exams   Has hx of breast cancer  Radiation last year -tol well

## 2017-04-01 NOTE — Assessment & Plan Note (Signed)
Pt is not interested in further screening at her age

## 2017-04-01 NOTE — Assessment & Plan Note (Signed)
Not currently a problem  Has venous insuff  rec supp stockings when needed

## 2017-04-01 NOTE — Assessment & Plan Note (Signed)
Lab Results  Component Value Date   VITAMINB12 333 03/31/2017   She will continue current supplementation with a shot every 3 mo

## 2017-04-01 NOTE — Progress Notes (Signed)
Subjective:    Patient ID: Jenna Young, female    DOB: [birth year], 84 y.o.   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 hearing aide in the L)  Declines hearing aide for R ear (still) -not enough of a problem  bp was up a bit   Colonoscopy 12/14 polyp cologuard - told she was too old  No blood in stool or changes in habits   dexa - declines  Hx of OP with spinal fx Took fosamax in the past Declines further tx  No falls or fractures  D level is 58.3-she continues to take it   Mammogram -has not had since she had her surgery  They normally send her a letter  Self breast exam =no lumps or changes   Zoster status -may be interested in shingrix    bp is stable today  No cp or palpitations or headaches or edema  No side effects to medicines  BP Readings from Last 3 Encounters:  04/01/17 140/62  03/31/17 130/60  03/03/17 (!) 94/52    She is not taking the metoprolol - because her bp was too low  She continues to take imdur  Hx of CAD and NSTEMI   Hx of anemia -chronic and stable  Lab Results  Component Value Date   WBC 6.0 03/31/2017   HGB 10.5 (L) 03/31/2017   HCT 31.9 (L) 03/31/2017   MCV 81.9 03/31/2017   PLT 310.0 03/31/2017   she has declined aggressive w/u in the past   B12 def Lab Results  Component Value Date   VITAMINB12 333 03/31/2017  will continue current supplementation    Hyperlipidemia Lab Results  Component Value Date   CHOL 125 03/31/2017   CHOL 173 02/11/2017   CHOL 205 (H) 03/21/2016   Lab Results  Component Value Date   HDL 50.50 03/31/2017   HDL 53 02/11/2017   HDL  56.50 03/21/2016   Lab Results  Component Value Date   LDLCALC 56 03/31/2017   LDLCALC 100 (H) 02/11/2017   LDLCALC 125 (H) 03/21/2016   Lab Results  Component Value Date   TRIG 92.0 03/31/2017   TRIG 98 02/11/2017   TRIG 117.0 03/21/2016   Lab Results  Component Value Date   CHOLHDL 2 03/31/2017   CHOLHDL 3.3 02/11/2017   CHOLHDL 4 03/21/2016   Lab Results  Component Value Date   LDLDIRECT 116.6 01/27/2013   LDLDIRECT 114.5 12/04/2010   LDLDIRECT 117.1 04/23/2010   On statin now in setting of known CAD and STEMI Good control now-much improved   Lab Results  Component Value Date   CREATININE 0.85 03/31/2017   BUN 10 03/31/2017   NA 138 03/31/2017   K 4.2 03/31/2017   CL 103 03/31/2017   CO2 28 03/31/2017   Lab Results  Component Value Date   ALT 11 03/31/2017   AST 15 03/31/2017   ALKPHOS 77 03/31/2017   BILITOT 0.6 03/31/2017    Lab Results  Component Value Date   TSH 1.19 03/31/2017  Patient Active Problem List   Diagnosis Date Noted  . Encounter for screening mammogram for breast cancer 04/01/2017  . H/O syncope 02/28/2017  . CAD (coronary artery disease), native coronary artery 02/21/2017  . NSTEMI (non-ST elevated myocardial infarction) ([place]) 02/10/2017  . Carcinoma of central portion of left breast in female, estrogen receptor negative ([place]) 04/17/2016  . Ductal carcinoma in situ (DCIS) of left breast 02/26/2016  . Varicose vein of leg 10/09/2015  . Pedal edema 10/09/2015  . Heart murmur 10/09/2015  . History of spinal fracture 05/07/2015  . Lumbar pain 05/03/2015  . Lipoma of face 11/08/2014  . Dysthymia 09/20/2013  . Encounter for Medicare annual wellness exam 02/03/2013  . BPPV (benign paroxysmal positional vertigo) 06/15/2012  . Chronic right ear pain 03/16/2012  . Colon cancer screening 01/17/2012  . Anemia 01/17/2012  . GRIEF REACTION 08/01/2009  . Vitamin D deficiency 08/19/2008  . ANXIETY 01/29/2008  . Essential hypertension,  benign 09/25/2007  . B12 deficiency 09/24/2006  . Hyperlipidemia 09/24/2006  . Venous (peripheral) insufficiency 09/24/2006  . ALLERGIC RHINITIS 09/24/2006  . GERD 09/24/2006  . Osteoporosis 09/24/2006   Past Medical History:  Diagnosis Date  . Allergic rhinitis   . Anemia   . Anxiety   . GERD (gastroesophageal reflux disease)   . History of radiation therapy 04/29/16- 05/24/16   Left Breast 40.05 Gy in 15 fractions, Left Breast boost 10 Gy in 5 fractions.   . Hyperlipidemia   . Hypertension    treated by Dr Thurnell Garbe recently with syncope episode  . Osteopenia   . Osteoporosis    Past Surgical History:  Procedure Laterality Date  . APPENDECTOMY    . BREAST LUMPECTOMY WITH RADIOACTIVE SEED LOCALIZATION Left 02/27/2016   Procedure: LEFT BREAST LUMPECTOMY WITH RADIOACTIVE SEED LOCALIZATION;  Surgeon: Autumn Messing III, MD;  Location: [facility];  Service: General;  Laterality: Left;  . BREAST SURGERY  10/10   benign biopsy  negative  . CESAREAN SECTION    . CHOLECYSTECTOMY    . CORONARY STENT INTERVENTION N/A 02/11/2017   Procedure: CORONARY STENT INTERVENTION;  Surgeon: Nelva Bush, MD;  Location: [facility];  Service: Cardiovascular;  Laterality: N/A;  . FEMUR FRACTURE SURGERY Right 2006  . LEFT HEART CATH AND CORONARY ANGIOGRAPHY N/A 02/11/2017   Procedure: LEFT HEART CATH AND CORONARY ANGIOGRAPHY;  Surgeon: Nelva Bush, MD;  Location: [facility];  Service: Cardiovascular;  Laterality: N/A;  . OVARIAN CYST SURGERY    . RE-EXCISION OF BREAST LUMPECTOMY Left 04/01/2016   Procedure: RE-EXCISION OF LEFT BREAST MEDIAL MARGIN;  Surgeon: Autumn Messing III, MD;  Location: [facility];  Service: General;  Laterality: Left;   Social History   Tobacco Use  . Smoking status: Former Smoker    Last attempt to quit: 03/11/1968    Years since quitting: 49.0  . Smokeless tobacco: Never Used  Substance Use Topics  . Alcohol use: No     Alcohol/week: 0.0 oz  . Drug use: No   Family History  Problem Relation Age of Onset  . Hypertension Mother   . Heart disease Father   . Macular degeneration Sister   . Diabetes Maternal Aunt   . Cancer Maternal Aunt        throat   Allergies  Allergen Reactions  . Buspirone Hcl     REACTION: HEART PALPITATIONS  . Codeine     REACTION: nausea and vomiting  . Hydrochlorothiazide  REACTION: syncope-possibly from dehydration  . Lansoprazole     REACTION: abd pain  . Minocycline Hcl   . Penicillins     REACTION: mouth numbness Has patient had a PCN reaction causing immediate rash, facial/tongue/throat swelling, SOB or lightheadedness with hypotension: No Has patient had a PCN reaction causing severe rash involving mucus membranes or skin necrosis: No Has patient had a PCN reaction that required hospitalization: No Has patient had a PCN reaction occurring within the last 10 years: No If all of the above answers are "NO", then may proceed with Cephalosporin use.  Marland Kitchen Phenergan [Promethazine Hcl] Other (See Comments)    Body shakes, hallucinations  . Ramipril     Cough   . Sulfa Antibiotics Nausea Only   Current Outpatient Medications on File Prior to Visit  Medication Sig Dispense Refill  . aspirin (ASPIRIN EC) 81 MG EC tablet Take 81 mg by mouth daily. Swallow whole.     Marland Kitchen atorvastatin (LIPITOR) 40 MG tablet Take 1 tablet (40 mg total) by mouth daily at 6 PM. 30 tablet 2  . Cholecalciferol (VITAMIN D) 2000 UNITS CAPS Take 1 capsule by mouth daily.      . clopidogrel (PLAVIX) 75 MG tablet Take 1 tablet (75 mg total) by mouth daily with breakfast. 30 tablet 2  . cyanocobalamin (,VITAMIN B-12,) 1000 MCG/ML injection Inject 1,000 mcg into the muscle every 3 (three) months.    . isosorbide mononitrate (IMDUR) 30 MG 24 hr tablet Take 0.5 tablets (15 mg total) by mouth daily. 15 tablet 0  . nitroGLYCERIN (NITROSTAT) 0.4 MG SL tablet Place 1 tablet (0.4 mg total) under the tongue  every 5 (five) minutes x 3 doses as needed for chest pain. 30 tablet 2  . omeprazole (PRILOSEC) 20 MG capsule Take 1 capsule (20 mg total) by mouth daily. (Patient taking differently: Take 20 mg by mouth daily as needed (acid reflux). ) 30 capsule 5   No current facility-administered medications on file prior to visit.      Review of Systems  Constitutional: Positive for fatigue. Negative for activity change, appetite change, fever and unexpected weight change.  HENT: Negative for congestion, ear pain, rhinorrhea, sinus pressure and sore throat.   Eyes: Negative for pain, redness and visual disturbance.  Respiratory: Negative for cough, chest tightness, shortness of breath and wheezing.   Cardiovascular: Negative for chest pain and palpitations.  Gastrointestinal: Negative for abdominal pain, blood in stool, constipation and diarrhea.  Endocrine: Negative for polydipsia and polyuria.  Genitourinary: Negative for dysuria, frequency and urgency.  Musculoskeletal: Positive for back pain. Negative for arthralgias and myalgias.  Skin: Negative for pallor and rash.  Allergic/Immunologic: Negative for environmental allergies.  Neurological: Negative for dizziness, syncope and headaches.  Hematological: Negative for adenopathy. Does not bruise/bleed easily.  Psychiatric/Behavioral: Negative for decreased concentration, dysphoric mood and sleep disturbance. The patient is not nervous/anxious.        Objective:   Physical Exam  Constitutional: She appears well-developed and well-nourished. No distress.  Well appearing   HENT:  Head: Normocephalic and atraumatic.  Right Ear: External ear normal.  Left Ear: External ear normal.  Mouth/Throat: Oropharynx is clear and moist.  Hearing aide in L ear only  Eyes: Conjunctivae and EOM are normal. Pupils are equal, round, and reactive to light. No scleral icterus.  Neck: Normal range of motion. Neck supple. No JVD present. Carotid bruit is not  present. No thyromegaly present.  Cardiovascular: Normal rate, regular rhythm and intact distal  pulses. Exam reveals no gallop.  Murmur heard. Minor varicosities in ankles  Pulmonary/Chest: Effort normal and breath sounds normal. No respiratory distress. She has no wheezes. She exhibits no tenderness.  Abdominal: Soft. Bowel sounds are normal. She exhibits no distension, no abdominal bruit and no mass. There is no tenderness.  Genitourinary: No breast swelling, tenderness, discharge or bleeding.  Genitourinary Comments: Breast exam: No mass, nodules, thickening, tenderness, bulging, retraction, inflamation, nipple discharge or skin changes noted.  No axillary or clavicular LA.    Baseline post op changes in L breast with scar above nipple and some nipple inversion- no change   Musculoskeletal: Normal range of motion. She exhibits no edema or tenderness.  Lymphadenopathy:    She has no cervical adenopathy.  Neurological: She is alert. She has normal reflexes. No cranial nerve deficit. She exhibits normal muscle tone. Coordination normal.  Skin: Skin is warm and dry. No rash noted. No erythema. No pallor.  Solar lentigines diffusely Fair complexion   Psychiatric: She has a normal mood and affect.  Cheerful Not anx today  Mentally sharp           Assessment & Plan:   Problem List Items Addressed This Visit      Cardiovascular and Mediastinum   Essential hypertension, benign - Primary    bp is fairly well controlled with imdur alone  She was unable to tolerate metoprolol  BP: 130/68  Pulse Rate: 87  Continue to monitor Good health habits  Continue cardio rehab and f/u with cardiology as planned for CAD and STEMI        Musculoskeletal and Integument   Osteoporosis    Continues to decline dexa  No falls or fx recent (past leg and spinal comp fx)  foxamax in the past  D level is therapeutic Disc imp of D/ ca and exercise  Disc fall prev         Other   Anemia    Stable  for years  Pt does not desire w/u        B12 deficiency    Lab Results  Component Value Date   VITAMINB12 333 03/31/2017   She will continue current supplementation with a shot every 3 mo       Carcinoma of central portion of left breast in female, estrogen receptor negative ([place])    Doing very well  Completed her radiation tx last year  I think she is due for a mammogram -ref done No change on exam        Colon cancer screening    Pt is not interested in further screening at her age      Ductal carcinoma in situ (DCIS) of left breast   Encounter for screening mammogram for breast cancer    Scheduled annual screening mammogram Nl breast exam today  Encouraged monthly self exams   Has hx of breast cancer  Radiation last year -tol well      Relevant Orders   MM DIGITAL SCREENING BILATERAL   Hyperlipidemia    Much improved with statin  In setting of CAD/ STEMI Disc goals for lipids and reasons to control them Rev labs with pt Rev low sat fat diet in detail       Pedal edema    Not currently a problem  Has venous insuff  rec supp stockings when needed       Vitamin D deficiency    Vitamin D level is therapeutic with current supplementation Disc importance  of this to bone and overall health

## 2017-04-01 NOTE — Assessment & Plan Note (Signed)
Vitamin D level is therapeutic with current supplementation Disc importance of this to bone and overall health  

## 2017-04-01 NOTE — Progress Notes (Signed)
Daily Session Note  Patient Details  Name: Jenna Young MRN: [id number] Date of Birth: [birth year] Referring Provider:     Cardiac Rehab from 02/20/2017 in [facility]  Referring Provider  End      Encounter Date: 04/01/2017  Check In: Session Check In - 04/01/17 0857      Check-In   Location  [facility]-Cardiac & Pulmonary Rehab    Staff Present  Alberteen Sam, MA, RCEP, CCRP, Exercise Physiologist;Amanda Oletta Darter, BA, ACSM CEP, Exercise Physiologist;Susanne Bice, RN, BSN, CCRP    Supervising physician immediately available to respond to emergencies  See telemetry face sheet for immediately available ER MD    Medication changes reported      No    Fall or balance concerns reported     No    Warm-up and Cool-down  Performed on first and last piece of equipment    Resistance Training Performed  Yes    VAD Patient?  No      Pain Assessment   Currently in Pain?  No/denies    Multiple Pain Sites  No        Exercise Prescription Changes - 03/31/17 1600      Response to Exercise   Blood Pressure (Admit)  122/72    Blood Pressure (Exercise)  128/58    Heart Rate (Admit)  90 bpm    Heart Rate (Exercise)  100 bpm    Heart Rate (Exit)  82 bpm    Rating of Perceived Exertion (Exercise)  13    Symptoms  none    Duration  Continue with 30 min of aerobic exercise without signs/symptoms of physical distress.    Intensity  THRR unchanged      Progression   Progression  Continue to progress workloads to maintain intensity without signs/symptoms of physical distress.    Average METs  2.05      Resistance Training   Training Prescription  Yes    Weight  2 lb    Reps  10-15      Interval Training   Interval Training  No      NuStep   Level  2    Minutes  15    METs  2.1      Biostep-RELP   Level  2    Minutes  15    METs  2       Social History   Tobacco Use  Smoking Status Former Smoker  . Last attempt to quit: 03/11/1968  . Years since quitting:  49.0  Smokeless Tobacco Never Used    Goals Met:  Exercise tolerated well Personal goals reviewed No report of cardiac concerns or symptoms Strength training completed today  Goals Unmet:  Not Applicable  Comments: Pt able to follow exercise prescription today without complaint.  Will continue to monitor for progression. Reviewed home exercise with pt today.  Pt plans to walk and returnto [employer] for exercise.  Reviewed THR, pulse, RPE, sign and symptoms, NTG use, and when to call 911 or MD.  Also discussed weather considerations and indoor options.  Pt voiced understanding.    Dr. Emily Filbert is Medical Director for [place] and [facility].

## 2017-04-01 NOTE — Assessment & Plan Note (Signed)
Stable for years  Pt does not desire w/u

## 2017-04-01 NOTE — Patient Instructions (Addendum)
If you are interested in a shingles vaccine-it is called Shingrix  Call your local pharmacy to see about getting on wait list if if is covered and affordable   Blood pressure is ok  For now continue to hold the metoprolol until you see cardiology   We will refer you for a mammogram on the way out   Take care of yourself

## 2017-04-01 NOTE — Assessment & Plan Note (Addendum)
Doing very well  Completed her radiation tx last year  I think she is due for a mammogram -ref done No change on exam   Rev last note from Dr Burr Medico Also radiation/onc  Supposed to f/u in survivorship clinic

## 2017-04-03 ENCOUNTER — Encounter: Payer: Medicare Other | Admitting: *Deleted

## 2017-04-03 DIAGNOSIS — Z7982 Long term (current) use of aspirin: Secondary | ICD-10-CM | POA: Diagnosis not present

## 2017-04-03 DIAGNOSIS — Z79899 Other long term (current) drug therapy: Secondary | ICD-10-CM | POA: Diagnosis not present

## 2017-04-03 DIAGNOSIS — Z7902 Long term (current) use of antithrombotics/antiplatelets: Secondary | ICD-10-CM | POA: Diagnosis not present

## 2017-04-03 DIAGNOSIS — K219 Gastro-esophageal reflux disease without esophagitis: Secondary | ICD-10-CM | POA: Diagnosis not present

## 2017-04-03 DIAGNOSIS — I252 Old myocardial infarction: Secondary | ICD-10-CM | POA: Diagnosis not present

## 2017-04-03 DIAGNOSIS — Z923 Personal history of irradiation: Secondary | ICD-10-CM | POA: Diagnosis not present

## 2017-04-03 DIAGNOSIS — I214 Non-ST elevation (NSTEMI) myocardial infarction: Secondary | ICD-10-CM

## 2017-04-03 NOTE — Progress Notes (Signed)
Daily Session Note  Patient Details  Name: Jenna Young MRN: [id number] Date of Birth: Jul 04, 1931 Referring Provider:     Cardiac Rehab from 02/20/2017 in [facility] Cardiac and Pulmonary Rehab  Referring Provider  End      Encounter Date: 04/03/2017  Check In: Session Check In - 04/03/17 0945      Check-In   Location  [facility]-Cardiac & Pulmonary Rehab    Staff Present  Alberteen Sam, MA, RCEP, CCRP, Exercise Physiologist;Amanda Oletta Darter, BA, ACSM CEP, Exercise Physiologist;Carroll Enterkin, RN, BSN    Supervising physician immediately available to respond to emergencies  See telemetry face sheet for immediately available ER MD    Medication changes reported      No    Fall or balance concerns reported     No    Warm-up and Cool-down  Performed on first and last piece of equipment    Resistance Training Performed  Yes    VAD Patient?  No      Pain Assessment   Currently in Pain?  No/denies          Social History   Tobacco Use  Smoking Status Former Smoker  . Last attempt to quit: 03/11/1968  . Years since quitting: 49.0  Smokeless Tobacco Never Used    Goals Met:  Independence with exercise equipment Exercise tolerated well No report of cardiac concerns or symptoms Strength training completed today  Goals Unmet:  Not Applicable  Comments: Pt able to follow exercise prescription today without complaint.  Will continue to monitor for progression.    Dr. Emily Filbert is Medical Director for [place] and [facility].

## 2017-04-08 ENCOUNTER — Encounter: Payer: Medicare Other | Admitting: *Deleted

## 2017-04-08 DIAGNOSIS — Z923 Personal history of irradiation: Secondary | ICD-10-CM | POA: Diagnosis not present

## 2017-04-08 DIAGNOSIS — Z7902 Long term (current) use of antithrombotics/antiplatelets: Secondary | ICD-10-CM | POA: Diagnosis not present

## 2017-04-08 DIAGNOSIS — I214 Non-ST elevation (NSTEMI) myocardial infarction: Secondary | ICD-10-CM

## 2017-04-08 DIAGNOSIS — I252 Old myocardial infarction: Secondary | ICD-10-CM | POA: Diagnosis not present

## 2017-04-08 DIAGNOSIS — Z7982 Long term (current) use of aspirin: Secondary | ICD-10-CM | POA: Diagnosis not present

## 2017-04-08 DIAGNOSIS — K219 Gastro-esophageal reflux disease without esophagitis: Secondary | ICD-10-CM | POA: Diagnosis not present

## 2017-04-08 DIAGNOSIS — Z79899 Other long term (current) drug therapy: Secondary | ICD-10-CM | POA: Diagnosis not present

## 2017-04-08 NOTE — Progress Notes (Signed)
Daily Session Note  Patient Details  Name: Jenna Young MRN: [id number] Date of Birth: [birth year] Referring Provider:     Cardiac Rehab from 02/20/2017 in [facility] Cardiac and Pulmonary Rehab  Referring Provider  End      Encounter Date: 04/08/2017  Check In: Session Check In - 04/08/17 2909      Check-In   Location  [facility]-Cardiac & Pulmonary Rehab    Staff Present  Alberteen Sam, MA, RCEP, CCRP, Exercise Physiologist;Amanda Oletta Darter, BA, ACSM CEP, Exercise Physiologist;Susanne Bice, RN, BSN, CCRP    Supervising physician immediately available to respond to emergencies  See telemetry face sheet for immediately available ER MD    Medication changes reported      No    Fall or balance concerns reported     No    Warm-up and Cool-down  Performed on first and last piece of equipment    Resistance Training Performed  Yes    VAD Patient?  No      Pain Assessment   Currently in Pain?  No/denies          Social History   Tobacco Use  Smoking Status Former Smoker  . Last attempt to quit: 03/11/1968  . Years since quitting: 49.1  Smokeless Tobacco Never Used    Goals Met:  Independence with exercise equipment Exercise tolerated well No report of cardiac concerns or symptoms Strength training completed today  Goals Unmet:  Not Applicable  Comments: Pt able to follow exercise prescription today without complaint.  Will continue to monitor for progression.    Dr. Emily Filbert is Medical Director for [place] and [facility].

## 2017-04-09 ENCOUNTER — Encounter: Payer: Self-pay | Admitting: *Deleted

## 2017-04-09 DIAGNOSIS — I214 Non-ST elevation (NSTEMI) myocardial infarction: Secondary | ICD-10-CM

## 2017-04-09 NOTE — Progress Notes (Signed)
Cardiac Individual Treatment Plan  Patient Details  Name: Jenna Young MRN: [id number] Date of Birth: [birth year] Referring Provider:     Cardiac Rehab from 02/20/2017 in [facility] Cardiac and Pulmonary Rehab  Referring Provider  End      Initial Encounter Date:    Cardiac Rehab from 02/20/2017 in [facility] Cardiac and Pulmonary Rehab  Date  02/20/17  Referring Provider  End      Visit Diagnosis: NSTEMI (non-ST elevated myocardial infarction) [facility])  Patient's Home Medications on Admission:  Current Outpatient Medications:  .  aspirin (ASPIRIN EC) 81 MG EC tablet, Take 81 mg by mouth daily. Swallow whole. , Disp: , Rfl:  .  atorvastatin (LIPITOR) 40 MG tablet, Take 1 tablet (40 mg total) by mouth daily at 6 PM., Disp: 30 tablet, Rfl: 2 .  Cholecalciferol (VITAMIN D) 2000 UNITS CAPS, Take 1 capsule by mouth daily.  , Disp: , Rfl:  .  clopidogrel (PLAVIX) 75 MG tablet, Take 1 tablet (75 mg total) by mouth daily with breakfast., Disp: 30 tablet, Rfl: 2 .  cyanocobalamin (,VITAMIN B-12,) 1000 MCG/ML injection, Inject 1,000 mcg into the muscle every 3 (three) months., Disp: , Rfl:  .  isosorbide mononitrate (IMDUR) 30 MG 24 hr tablet, Take 0.5 tablets (15 mg total) by mouth daily., Disp: 15 tablet, Rfl: 0 .  nitroGLYCERIN (NITROSTAT) 0.4 MG SL tablet, Place 1 tablet (0.4 mg total) under the tongue every 5 (five) minutes x 3 doses as needed for chest pain., Disp: 30 tablet, Rfl: 2 .  omeprazole (PRILOSEC) 20 MG capsule, Take 1 capsule (20 mg total) by mouth daily. (Patient taking differently: Take 20 mg by mouth daily as needed (acid reflux). ), Disp: 30 capsule, Rfl: 5  Past Medical History: Past Medical History:  Diagnosis Date  . Allergic rhinitis   . Anemia   . Anxiety   . GERD (gastroesophageal reflux disease)   . History of radiation therapy 04/29/16- 05/24/16   Left Breast 40.05 Gy in 15 fractions, Left Breast boost 10 Gy in 5 fractions.   . Hyperlipidemia   . Hypertension    treated  by Dr Thurnell Garbe recently with syncope episode  . Osteopenia   . Osteoporosis     Tobacco Use: Social History   Tobacco Use  Smoking Status Former Smoker  . Last attempt to quit: 03/11/1968  . Years since quitting: 49.1  Smokeless Tobacco Never Used    Labs: Recent Chemical engineer    Labs for ITP Cardiac and Pulmonary Rehab Latest Ref Rng & Units 02/21/2014 03/17/2015 03/21/2016 02/11/2017 03/31/2017   Cholestrol 0 - 200 mg/dL 221(H) 200 205(H) 173 125   LDLCALC 0 - 99 mg/dL 137(H) 115(H) 125(H) 100(H) 56   LDLDIRECT mg/dL - - - - -   HDL >39.00 mg/dL 48.60 51.50 56.50 53 50.50   Trlycerides 0.0 - 149.0 mg/dL 179.0(H) 167.0(H) 117.0 98 92.0   Hemoglobin A1c 4.6 - 6.1 % - - - - -   TCO2 0 - 100 mmol/L - - - - -       Exercise Target Goals:    Exercise Program Goal: Individual exercise prescription set using results from initial 6 min walk test and THRR while considering  patient's activity barriers and safety.   Exercise Prescription Goal: Initial exercise prescription builds to 30-45 minutes a day of aerobic activity, 2-3 days per week.  Home exercise guidelines will be given to patient during program as part of exercise prescription that the participant will  acknowledge.  Activity Barriers & Risk Stratification: Activity Barriers & Cardiac Risk Stratification - 02/20/17 1424      Activity Barriers & Cardiac Risk Stratification   Activity Barriers  Back Problems History of compression fracture.  Exercise helps.    Cardiac Risk Stratification  High       6 Minute Walk: 6 Minute Walk    Row Name 02/20/17 1445         6 Minute Walk   Distance  780 feet     Walk Time  6 minutes     # of Rest Breaks  0     MPH  1.5     METS  1.5     RPE  11     Perceived Dyspnea   0     VO2 Peak  5.2     Symptoms  No     Resting HR  79 bpm     Resting BP  136/58     Resting Oxygen Saturation   8 %     Exercise Oxygen Saturation  during 6 min walk  100 %     Max Ex. HR   102 bpm     Max Ex. BP  148/50     2 Minute Post BP  130/52        Oxygen Initial Assessment:   Oxygen Re-Evaluation:   Oxygen Discharge (Final Oxygen Re-Evaluation):   Initial Exercise Prescription: Initial Exercise Prescription - 02/20/17 1400      Date of Initial Exercise RX and Referring Provider   Date  02/20/17    Referring Provider  End      Treadmill   MPH  1    Grade  0    Minutes  15    METs  1.77      NuStep   Level  1    SPM  80    Minutes  15    METs  1.5      Biostep-RELP   Level  1    SPM  50    Minutes  15    METs  1.5      Prescription Details   Frequency (times per week)  3    Duration  Progress to 45 minutes of aerobic exercise without signs/symptoms of physical distress      Intensity   THRR 40-80% of Max Heartrate  102-125    Ratings of Perceived Exertion  11-13    Perceived Dyspnea  0-4      Resistance Training   Training Prescription  Yes    Weight  2 lb    Reps  10-15       Perform Capillary Blood Glucose checks as needed.  Exercise Prescription Changes: Exercise Prescription Changes    Row Name 02/20/17 1400 03/18/17 1600 03/31/17 1600 04/01/17 0900       Response to Exercise   Blood Pressure (Admit)  135/58  116/62  122/72  -    Blood Pressure (Exercise)  148/50  130/58  128/58  -    Blood Pressure (Exit)  130/52  112/62  -  -    Heart Rate (Admit)  79 bpm  98 bpm  90 bpm  -    Heart Rate (Exercise)  102 bpm  106 bpm  100 bpm  -    Heart Rate (Exit)  73 bpm  86 bpm  82 bpm  -    Oxygen Saturation (Admit)  98 %  -  -  -  Oxygen Saturation (Exit)  100 %  -  -  -    Rating of Perceived Exertion (Exercise)  '11  12  13  '$ -    Symptoms  -  none  none  -    Duration  -  Continue with 30 min of aerobic exercise without signs/symptoms of physical distress.  Continue with 30 min of aerobic exercise without signs/symptoms of physical distress.  -    Intensity  -  THRR unchanged  THRR unchanged  -      Progression    Progression  -  Continue to progress workloads to maintain intensity without signs/symptoms of physical distress.  Continue to progress workloads to maintain intensity without signs/symptoms of physical distress.  -    Average METs  -  2.08  2.05  -      Resistance Training   Training Prescription  -  Yes  Yes  -    Weight  -  2 lb  2 lb  -    Reps  -  10-15  10-15  -      Interval Training   Interval Training  -  No  No  -      Treadmill   MPH  -  1.5  -  -    Grade  -  0  -  -    Minutes  -  15  -  -    METs  -  2.15  -  -      NuStep   Level  -  2  2  -    Minutes  -  15  15  -    METs  -  2.1  2.1  -      Biostep-RELP   Level  -  2  2  -    Minutes  -  15  15  -    METs  -  2  2  -      Home Exercise Plan   Plans to continue exercise at  -  -  [employer] (comment) walking and [employer]    Frequency  -  -  -  Add 2 additional days to program exercise sessions.    Initial Home Exercises Provided  -  -  -  04/01/17       Exercise Comments: Exercise Comments    Row Name 03/06/17 1203           Exercise Comments  First full day of exercise!  Patient was oriented to gym and equipment including functions, settings, policies, and procedures.  Patient's individual exercise prescription and treatment plan were reviewed.  All starting workloads were established based on the results of the 6 minute walk test done at initial orientation visit.  The plan for exercise progression was also introduced and progression will be customized based on patient's performance and goals.          Exercise Goals and Review: Exercise Goals    Row Name 02/20/17 1444             Exercise Goals   Increase Physical Activity  Yes       Intervention  Provide advice, education, support and counseling about physical activity/exercise needs.;Develop an individualized exercise prescription for aerobic and resistive training based on initial evaluation findings, risk  stratification, comorbidities and participant's personal goals.       Expected Outcomes  Achievement of increased cardiorespiratory fitness  and enhanced flexibility, muscular endurance and strength shown through measurements of functional capacity and personal statement of participant.       Increase Strength and Stamina  Yes       Intervention  Provide advice, education, support and counseling about physical activity/exercise needs.;Develop an individualized exercise prescription for aerobic and resistive training based on initial evaluation findings, risk stratification, comorbidities and participant's personal goals.       Expected Outcomes  Achievement of increased cardiorespiratory fitness and enhanced flexibility, muscular endurance and strength shown through measurements of functional capacity and personal statement of participant.       Able to understand and use rate of perceived exertion (RPE) scale  Yes       Intervention  Provide education and explanation on how to use RPE scale       Expected Outcomes  Short Term: Able to use RPE daily in rehab to express subjective intensity level;Long Term:  Able to use RPE to guide intensity level when exercising independently       Able to understand and use Dyspnea scale  Yes       Intervention  Provide education and explanation on how to use Dyspnea scale       Expected Outcomes  Short Term: Able to use Dyspnea scale daily in rehab to express subjective sense of shortness of breath during exertion;Long Term: Able to use Dyspnea scale to guide intensity level when exercising independently       Knowledge and understanding of Target Heart Rate Range (THRR)  Yes       Intervention  Provide education and explanation of THRR including how the numbers were predicted and where they are located for reference       Expected Outcomes  Short Term: Able to state/look up THRR;Long Term: Able to use THRR to govern intensity when exercising independently;Short Term:  Able to use daily as guideline for intensity in rehab       Able to check pulse independently  Yes       Intervention  Provide education and demonstration on how to check pulse in carotid and radial arteries.;Review the importance of being able to check your own pulse for safety during independent exercise       Expected Outcomes  Short Term: Able to explain why pulse checking is important during independent exercise;Long Term: Able to check pulse independently and accurately       Understanding of Exercise Prescription  Yes       Intervention  Provide education, explanation, and written materials on patient's individual exercise prescription       Expected Outcomes  Short Term: Able to explain program exercise prescription;Long Term: Able to explain home exercise prescription to exercise independently          Exercise Goals Re-Evaluation : Exercise Goals Re-Evaluation    Row Name 03/06/17 1204 03/18/17 1648 03/31/17 1653 04/01/17 0821       Exercise Goal Re-Evaluation   Exercise Goals Review  Understanding of Exercise Prescription;Knowledge and understanding of Target Heart Rate Range (THRR);Able to understand and use rate of perceived exertion (RPE) scale  Increase Strength and Stamina;Understanding of Exercise Prescription;Increase Physical Activity  Increase Strength and Stamina;Understanding of Exercise Prescription;Increase Physical Activity  Increase Physical Activity;Increase Strength and Stamina;Understanding of Exercise Prescription    Comments  Reviewed RPE scale, THR and program prescription with pt today.  Pt voiced understanding and was given a copy of goals to take home.   Tylisha is off  to a good start in rehab.  She has completed three full days of exercise.  So far she is doing well with her workloads.  We will try to start increasing her workloads and continue to monitor her progression.   Kimbella has only attended once since last review.  She continues to do well and upon return we  will start to increase workloads some more.  We will continue to monitor her progression.   Shirle feels that exercise is is helping her.  She hopes it helps enough to keep her off the operating table for CABG.  She has not had any of the chest pain/burning with exercise.  She has not been exercising at home.  Reviewed home exercise with pt today.  Pt plans to walk and returnto [employer] for exercise.  Reviewed THR, pulse, RPE, sign and symptoms, NTG use, and when to call 911 or MD.  Also discussed weather considerations and indoor options.  Pt voiced understanding.    Expected Outcomes  Short: Use RPE daily to regulate intensity.  Long: Follow program prescription in THR.  Short: Increase some workloads.  Long: Continue to work in increasing physcial activity  Short: Increase some workloads.  Long: Continue to work in Psychologist, counselling activity  Short: Return to [employer] at least two days a week.  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        Discharge Exercise Prescription (Final Exercise Prescription Changes): Exercise Prescription Changes - 04/01/17 0900      Home Exercise Plan   Plans to continue exercise at  [facility] (comment) walking and [employer]    Frequency  Add 2 additional days to program exercise sessions.    Initial Home Exercises Provided  04/01/17       Nutrition:  Target Goals: Understanding of nutrition guidelines, daily intake of sodium '1500mg'$ , cholesterol '200mg'$ , calories 30% from fat and 7% or less from saturated fats, daily to have 5 or more servings of fruits and vegetables.  Biometrics: Pre Biometrics - 02/20/17 1444      Pre Biometrics   Height  5' 2.5" (1.588 m)    Weight  138 lb 1.6 oz (62.6 kg)    Waist Circumference  32.5 inches    Hip Circumference  39 inches    Waist to Hip Ratio  0.83 %    BMI (Calculated)  24.84    Single Leg Stand  2.45 seconds        Nutrition Therapy Plan and Nutrition Goals: Nutrition  Therapy & Goals - 02/20/17 1426      Intervention Plan   Intervention  Prescribe, educate and counsel regarding individualized specific dietary modifications aiming towards targeted core components such as weight, hypertension, lipid management, diabetes, heart failure and other comorbidities.    Expected Outcomes  Short Term Goal: Understand basic principles of dietary content, such as calories, fat, sodium, cholesterol and nutrients.;Short Term Goal: A plan has been developed with personal nutrition goals set during dietitian appointment.;Long Term Goal: Adherence to prescribed nutrition plan.       Nutrition Assessments: Nutrition Assessments - 02/20/17 1426      MEDFICTS Scores   Pre Score  24       Nutrition Goals Re-Evaluation: Nutrition Goals Re-Evaluation    Row Name 04/01/17 0831             Goals   Current Weight  135 lb (61.2 kg)       Nutrition Goal  Meet with dietician       Comment  Appt scheduled for 1/31 during class.   She has been eating a lot of chicken and would like some alternatives.        Expected Outcome  Short: Meet with dietican.  Long: Follow recommendations set out by nutritionist          Nutrition Goals Discharge (Final Nutrition Goals Re-Evaluation): Nutrition Goals Re-Evaluation - 04/01/17 0831      Goals   Current Weight  135 lb (61.2 kg)    Nutrition Goal  Meet with dietician    Comment  Appt scheduled for 1/31 during class.   She has been eating a lot of chicken and would like some alternatives.     Expected Outcome  Short: Meet with dietican.  Long: Follow recommendations set out by nutritionist       Psychosocial: Target Goals: Acknowledge presence or absence of significant depression and/or stress, maximize coping skills, provide positive support system. Participant is able to verbalize types and ability to use techniques and skills needed for reducing stress and depression.   Initial Review & Psychosocial Screening: Initial Psych  Review & Screening - 02/20/17 1427      Initial Review   Current issues with  None Identified      Family Dynamics   Good Support System?  Yes Daughter lives in [place], son      Barriers   Psychosocial barriers to participate in program  There are no identifiable barriers or psychosocial needs.;The patient should benefit from training in stress management and relaxation.      Screening Interventions   Interventions  Yes;To provide support and resources with identified psychosocial needs;Provide feedback about the scores to participant;Encouraged to exercise    Expected Outcomes  Short Term goal: Utilizing psychosocial counselor, staff and physician to assist with identification of specific Stressors or current issues interfering with healing process. Setting desired goal for each stressor or current issue identified.;Long Term Goal: Stressors or current issues are controlled or eliminated.;Short Term goal: Identification and review with participant of any Quality of Life or Depression concerns found by scoring the questionnaire.;Long Term goal: The participant improves quality of Life and PHQ9 Scores as seen by post scores and/or verbalization of changes       Quality of Life Scores:  Quality of Life - 02/20/17 1429      Quality of Life Scores   Health/Function Pre  28.5 %    Socioeconomic Pre  30 %    Psych/Spiritual Pre  29.14 %    Family Pre  30 %    GLOBAL Pre  29.17 %      Scores of 19 and below usually indicate a poorer quality of life in these areas.  A difference of  2-3 points is a clinically meaningful difference.  A difference of 2-3 points in the total score of the Quality of Life Index has been associated with significant improvement in overall quality of life, self-image, physical symptoms, and general health in studies assessing change in quality of life.  PHQ-9: Recent Review Flowsheet Data    Depression screen [facility] 2/9 03/31/2017 02/20/2017 06/28/2016 04/17/2016  03/21/2016   Decreased Interest 0 0 0 0 0   Down, Depressed, Hopeless 0 0 0 0 0   PHQ - 2 Score 0 0 0 0 0   Altered sleeping 0 0 - - -   Tired, decreased energy 0 1 - - -   Change in  appetite 0 0 - - -   Feeling bad or failure about yourself  0 0 - - -   Trouble concentrating 0 0 - - -   Moving slowly or fidgety/restless 0 0 - - -   Suicidal thoughts 0 0 - - -   PHQ-9 Score 0 1 - - -   Difficult doing work/chores Not difficult at all Not difficult at all - - -     Interpretation of Total Score  Total Score Depression Severity:  1-4 = Minimal depression, 5-9 = Mild depression, 10-14 = Moderate depression, 15-19 = Moderately severe depression, 20-27 = Severe depression   Psychosocial Evaluation and Intervention: Psychosocial Evaluation - 03/18/17 0937      Psychosocial Evaluation & Interventions   Interventions  Encouraged to exercise with the program and follow exercise prescription    Comments  Counselor met with Ms. Nienhaus Inez Catalina) today for initial psychosocial evaluation.  She is an 84 year old who reports having a mild heart attack on 02/11/17.  She has a strong support system with adult children and grandchildren who live close by.  Edmund lives alone but her grandson and his wife stay with her at night.  She is actively involved in her local church community as well.  Inola is scheduled to have catarac surgery in the next week and may be required to miss a few sessions in this program during recovery.  She reports sleeping well and her appetite has decreased -  although she reports not having lost any weight.  Tayonna denies a history of depression or anxiety or any current symptoms. She is generally in a positive mood and reports minimal stress in her life at this time, other than her health.  Ligia has goals to increase her stamina and strength.  Staff will follow    Expected Outcomes  Rashanna will benefit from consistent exercise to achieve her stated goals.  The educational and  psychoeducational components will be helpful in understanding and coping more positively with her condition.     Continue Psychosocial Services   Follow up required by staff       Psychosocial Re-Evaluation: Psychosocial Re-Evaluation    [place] Name 04/01/17 [phone]             Psychosocial Re-Evaluation   Current issues with  Current Sleep Concerns;Current Stress Concerns       Comments  Taurus has been feeling weak overall.  She has been sleeping well, but last night her leg kept jumping and kept her awake.  She had a wellness exam yesterday and told them about rehab and they were pleased that she is in here.  She continues to have minimal stress in her life.       Expected Outcomes  Short: Attend stress lecture and sleep lecture.  Long: Continue to maintain postive outlook and exercise regularly.           Psychosocial Discharge (Final Psychosocial Re-Evaluation): Psychosocial Re-Evaluation - 04/01/17 6967      Psychosocial Re-Evaluation   Current issues with  Current Sleep Concerns;Current Stress Concerns    Comments  Starleen has been feeling weak overall.  She has been sleeping well, but last night her leg kept jumping and kept her awake.  She had a wellness exam yesterday and told them about rehab and they were pleased that she is in here.  She continues to have minimal stress in her life.    Expected Outcomes  Short: Attend stress lecture and  sleep lecture.  Long: Continue to maintain postive outlook and exercise regularly.        Vocational Rehabilitation: Provide vocational rehab assistance to qualifying candidates.   Vocational Rehab Evaluation & Intervention: Vocational Rehab - 02/20/17 1434      Initial Vocational Rehab Evaluation & Intervention   Assessment shows need for Vocational Rehabilitation  No       Education: Education Goals: Education classes will be provided on a variety of topics geared toward better understanding of heart health and risk factor modification.  Participant will state understanding/return demonstration of topics presented as noted by education test scores.  Learning Barriers/Preferences: Learning Barriers/Preferences - 02/20/17 1433      Learning Barriers/Preferences   Learning Barriers  Hearing    Learning Preferences  None       Education Topics:  AED/CPR: - Group verbal and written instruction with the use of models to demonstrate the basic use of the AED with the basic ABC's of resuscitation.   General Nutrition Guidelines/Fats and Fiber: -Group instruction provided by verbal, written material, models and posters to present the general guidelines for heart healthy nutrition. Gives an explanation and review of dietary fats and fiber.   Cardiac Rehab from 04/08/2017 in [facility] Cardiac and Pulmonary Rehab  Date  04/08/17  Educator  CR  Instruction Review Code  1- Verbalizes Understanding      Controlling Sodium/Reading Food Labels: -Group verbal and written material supporting the discussion of sodium use in heart healthy nutrition. Review and explanation with models, verbal and written materials for utilization of the food label.   Exercise Physiology & General Exercise Guidelines: - Group verbal and written instruction with models to review the exercise physiology of the cardiovascular system and associated critical values. Provides general exercise guidelines with specific guidelines to those with heart or lung disease.    Cardiac Rehab from 04/08/2017 in [facility] Cardiac and Pulmonary Rehab  Date  03/06/17  Educator  [facility]  Instruction Review Code  1- Verbalizes Understanding      Aerobic Exercise & Resistance Training: - Gives group verbal and written instruction on the various components of exercise. Focuses on aerobic and resistive training programs and the benefits of this training and how to safely progress through these programs..   Cardiac Rehab from 04/08/2017 in [facility] Cardiac and Pulmonary Rehab  Date  03/13/17   Educator  [facility]  Instruction Review Code  1- Verbalizes Understanding      Flexibility, Balance, Mind/Body Relaxation: Provides group verbal/written instruction on the benefits of flexibility and balance training, including mind/body exercise modes such as yoga, pilates and tai chi.  Demonstration and skill practice provided.   Cardiac Rehab from 04/08/2017 in [facility] Cardiac and Pulmonary Rehab  Date  03/18/17  Educator  AS  Instruction Review Code  1- Verbalizes Understanding      Stress and Anxiety: - Provides group verbal and written instruction about the health risks of elevated stress and causes of high stress.  Discuss the correlation between heart/lung disease and anxiety and treatment options. Review healthy ways to manage with stress and anxiety.   Cardiac Rehab from 04/08/2017 in [facility] Cardiac and Pulmonary Rehab  Date  04/01/17  Educator  [facility]  Instruction Review Code  1- Verbalizes Understanding      Depression: - Provides group verbal and written instruction on the correlation between heart/lung disease and depressed mood, treatment options, and the stigmas associated with seeking treatment.   Anatomy & Physiology of the Heart: - Group  verbal and written instruction and models provide basic cardiac anatomy and physiology, with the coronary electrical and arterial systems. Review of Valvular disease and Heart Failure   Cardiac Rehab from 04/08/2017 in [facility] Cardiac and Pulmonary Rehab  Date  04/03/17  Educator  CE  Instruction Review Code  1- Verbalizes Understanding      Cardiac Procedures: - Group verbal and written instruction to review commonly prescribed medications for heart disease. Reviews the medication, class of the drug, and side effects. Includes the steps to properly store meds and maintain the prescription regimen. (beta blockers and nitrates)   Cardiac Medications I: - Group verbal and written instruction to review commonly prescribed medications for heart  disease. Reviews the medication, class of the drug, and side effects. Includes the steps to properly store meds and maintain the prescription regimen.   Cardiac Medications II: -Group verbal and written instruction to review commonly prescribed medications for heart disease. Reviews the medication, class of the drug, and side effects. (all other drug classes)   Cardiac Rehab from 04/08/2017 in [facility] Cardiac and Pulmonary Rehab  Date  03/20/17 [facility]]  Educator  [facility]  Instruction Review Code  1- Verbalizes Understanding       Go Sex-Intimacy & Heart Disease, Get SMART - Goal Setting: - Group verbal and written instruction through game format to discuss heart disease and the return to sexual intimacy. Provides group verbal and written material to discuss and apply goal setting through the application of the S.M.A.R.T. Method.   Other Matters of the Heart: - Provides group verbal, written materials and models to describe Stable Angina and Peripheral Artery. Includes description of the disease process and treatment options available to the cardiac patient.   Cardiac Rehab from 04/08/2017 in [facility] Cardiac and Pulmonary Rehab  Date  04/03/17  Educator  CE  Instruction Review Code  1- Verbalizes Understanding      Exercise & Equipment Safety: - Individual verbal instruction and demonstration of equipment use and safety with use of the equipment.   Cardiac Rehab from 04/08/2017 in [facility] Cardiac and Pulmonary Rehab  Date  02/20/17  Educator  [facility]  Instruction Review Code  1- Verbalizes Understanding      Infection Prevention: - Provides verbal and written material to individual with discussion of infection control including proper hand washing and proper equipment cleaning during exercise session.   Cardiac Rehab from 04/08/2017 in [facility] Cardiac and Pulmonary Rehab  Date  02/20/17  Educator  [facility]  Instruction Review Code  1- Verbalizes Understanding      Falls Prevention: - Provides  verbal and written material to individual with discussion of falls prevention and safety.   Cardiac Rehab from 04/08/2017 in [facility] Cardiac and Pulmonary Rehab  Date  02/20/17  Educator  [facility]  Instruction Review Code  1- Verbalizes Understanding      Diabetes: - Individual verbal and written instruction to review signs/symptoms of diabetes, desired ranges of glucose level fasting, after meals and with exercise. Acknowledge that pre and post exercise glucose checks will be done for 3 sessions at entry of program.   Know Your Numbers and Risk Factors: -Group verbal and written instruction about important numbers in your health.  Discussion of what are risk factors and how they play a role in the disease process.  Review of Cholesterol, Blood Pressure, Diabetes, and BMI and the role they play in your overall health.   Cardiac Rehab from 04/08/2017 in [facility] Cardiac and Pulmonary Rehab  Date  03/20/17 [Risk Factors]  Educator  [place]  Instruction Review Code  1- [employer] Understanding      Sleep Hygiene: -Provides group verbal and written instruction about how sleep can affect your health.  Define sleep hygiene, discuss sleep cycles and impact of sleep habits. Review good sleep hygiene tips.    Other: -Provides group and verbal instruction on various topics (see comments)   Knowledge Questionnaire Score: Knowledge Questionnaire Score - 02/20/17 1433      Knowledge Questionnaire Score   Pre Score  20/28 Correct responses reviewed with Inez Catalina. She verbalized understanding of the responses and had no questions today.       Core Components/Risk Factors/Patient Goals at Admission: Personal Goals and Risk Factors at Admission - 02/20/17 1426      Core Components/Risk Factors/Patient Goals on Admission   Hypertension  Yes    Intervention  Provide education on lifestyle modifcations including regular physical activity/exercise, weight management, moderate sodium restriction and increased  consumption of fresh fruit, vegetables, and low fat dairy, alcohol moderation, and smoking cessation.;Monitor prescription use compliance.    Expected Outcomes  Short Term: Continued assessment and intervention until BP is < 140/48m HG in hypertensive participants. < 130/83mHG in hypertensive participants with diabetes, heart failure or chronic kidney disease.;Long Term: Maintenance of blood pressure at goal levels.    Lipids  Yes    Intervention  Provide education and support for participant on nutrition & aerobic/resistive exercise along with prescribed medications to achieve LDL '70mg'$ , HDL >'40mg'$ .    Expected Outcomes  Short Term: Participant states understanding of desired cholesterol values and is compliant with medications prescribed. Participant is following exercise prescription and nutrition guidelines.;Long Term: Cholesterol controlled with medications as prescribed, with individualized exercise RX and with personalized nutrition plan. Value goals: LDL < '70mg'$ , HDL > 40 mg.       Core Components/Risk Factors/Patient Goals Review:  Goals and Risk Factor Review    Row Name 04/01/17 0826             Core Components/Risk Factors/Patient Goals Review   Personal Goals Review  Weight Management/Obesity;Hypertension;Lipids       Review  BeZaharaas been doing well in rehab.  Her weight has been pretty steady.  She saw doctor yesterday and blood pressure was slightly elevated at 130/60.  Her pressures have been good here in class.  She has the ability to to check it at home. She hasn't been checking it t home as she is afraid it would be too high.  We talked about the importance of tracking her blood pressure daily to keep record for doctor.  She is doing well on her medications.       Expected Outcomes  Short: Start checking blood pressures regularly.  Long: Continue to work on blood pressure management.          Core Components/Risk Factors/Patient Goals at Discharge (Final Review):  Goals  and Risk Factor Review - 04/01/17 0826      Core Components/Risk Factors/Patient Goals Review   Personal Goals Review  Weight Management/Obesity;Hypertension;Lipids    Review  BeFajras been doing well in rehab.  Her weight has been pretty steady.  She saw doctor yesterday and blood pressure was slightly elevated at 130/60.  Her pressures have been good here in class.  She has the ability to to check it at home. She hasn't been checking it t home as she is afraid it would be too high.  We talked about the  importance of tracking her blood pressure daily to keep record for doctor.  She is doing well on her medications.    Expected Outcomes  Short: Start checking blood pressures regularly.  Long: Continue to work on blood pressure management.       ITP Comments: ITP Comments    Row Name 02/20/17 1417 03/12/17 0650 04/01/17 0821 04/09/17 0612     ITP Comments  Medical review completed today. ITP sent to medical director for review, changes as needed and signature.  Documentation of the diagnosis  can be found in [facility] ADmission 02/10/2017  30 day review. Continue with ITP unless directed changes per Medical Director review.   Lus has been out after cataract surgery.  She was given a one week resistriction so she is back today.   30 Day review. Continue with ITP unless directed changes per Medical Director review.        Comments:

## 2017-04-10 ENCOUNTER — Encounter: Payer: Medicare Other | Admitting: *Deleted

## 2017-04-10 DIAGNOSIS — Z79899 Other long term (current) drug therapy: Secondary | ICD-10-CM | POA: Diagnosis not present

## 2017-04-10 DIAGNOSIS — I214 Non-ST elevation (NSTEMI) myocardial infarction: Secondary | ICD-10-CM

## 2017-04-10 DIAGNOSIS — I252 Old myocardial infarction: Secondary | ICD-10-CM | POA: Diagnosis not present

## 2017-04-10 DIAGNOSIS — Z7902 Long term (current) use of antithrombotics/antiplatelets: Secondary | ICD-10-CM | POA: Diagnosis not present

## 2017-04-10 DIAGNOSIS — Z923 Personal history of irradiation: Secondary | ICD-10-CM | POA: Diagnosis not present

## 2017-04-10 DIAGNOSIS — Z7982 Long term (current) use of aspirin: Secondary | ICD-10-CM | POA: Diagnosis not present

## 2017-04-10 DIAGNOSIS — K219 Gastro-esophageal reflux disease without esophagitis: Secondary | ICD-10-CM | POA: Diagnosis not present

## 2017-04-10 NOTE — Progress Notes (Signed)
Daily Session Note  Patient Details  Name: Jenna Young MRN: [id number] Date of Birth: [birth year] Referring Provider:     Cardiac Rehab from 02/20/2017 in [facility] Cardiac and Pulmonary Rehab  Referring Provider  End      Encounter Date: 04/10/2017  Check In: Session Check In - 04/10/17 1019      Check-In   Location  [facility]-Cardiac & Pulmonary Rehab    Staff Present  Alberteen Sam, MA, RCEP, CCRP, Exercise Physiologist;Amanda Oletta Darter, BA, ACSM CEP, Exercise Physiologist;Carroll Enterkin, RN, BSN    Supervising physician immediately available to respond to emergencies  See telemetry face sheet for immediately available ER MD    Medication changes reported      No    Fall or balance concerns reported     No    Warm-up and Cool-down  Performed on first and last piece of equipment    Resistance Training Performed  Yes    VAD Patient?  No      Pain Assessment   Currently in Pain?  No/denies          Social History   Tobacco Use  Smoking Status Former Smoker  . Last attempt to quit: 03/11/1968  . Years since quitting: 49.1  Smokeless Tobacco Never Used    Goals Met:  Independence with exercise equipment Exercise tolerated well No report of cardiac concerns or symptoms Strength training completed today  Goals Unmet:  Not Applicable  Comments: Pt able to follow exercise prescription today without complaint.  Will continue to monitor for progression.    Dr. Emily Filbert is Medical Director for [place] and [facility].

## 2017-04-14 ENCOUNTER — Other Ambulatory Visit: Payer: Self-pay | Admitting: Family Medicine

## 2017-04-14 NOTE — Telephone Encounter (Signed)
Rx was given in hospital, prescribed on 02/13/17 #30 tabs with 2 additional refills, ? How long pt is suppose to stay on plavix, please advise

## 2017-04-14 NOTE — Telephone Encounter (Signed)
That will be up to Dr Rockey Situ  I think her next f/u if march? Please confirm that  Please refill for 6 mo  Thanks

## 2017-04-15 ENCOUNTER — Encounter: Payer: Medicare Other | Attending: Internal Medicine | Admitting: *Deleted

## 2017-04-15 DIAGNOSIS — Z923 Personal history of irradiation: Secondary | ICD-10-CM | POA: Diagnosis not present

## 2017-04-15 DIAGNOSIS — K219 Gastro-esophageal reflux disease without esophagitis: Secondary | ICD-10-CM | POA: Diagnosis not present

## 2017-04-15 DIAGNOSIS — D2271 Melanocytic nevi of right lower limb, including hip: Secondary | ICD-10-CM | POA: Diagnosis not present

## 2017-04-15 DIAGNOSIS — Z7902 Long term (current) use of antithrombotics/antiplatelets: Secondary | ICD-10-CM | POA: Insufficient documentation

## 2017-04-15 DIAGNOSIS — I252 Old myocardial infarction: Secondary | ICD-10-CM | POA: Diagnosis not present

## 2017-04-15 DIAGNOSIS — D225 Melanocytic nevi of trunk: Secondary | ICD-10-CM | POA: Diagnosis not present

## 2017-04-15 DIAGNOSIS — I214 Non-ST elevation (NSTEMI) myocardial infarction: Secondary | ICD-10-CM

## 2017-04-15 DIAGNOSIS — Z87891 Personal history of nicotine dependence: Secondary | ICD-10-CM | POA: Insufficient documentation

## 2017-04-15 DIAGNOSIS — L918 Other hypertrophic disorders of the skin: Secondary | ICD-10-CM | POA: Diagnosis not present

## 2017-04-15 DIAGNOSIS — I10 Essential (primary) hypertension: Secondary | ICD-10-CM | POA: Insufficient documentation

## 2017-04-15 DIAGNOSIS — Z7982 Long term (current) use of aspirin: Secondary | ICD-10-CM | POA: Diagnosis not present

## 2017-04-15 DIAGNOSIS — E785 Hyperlipidemia, unspecified: Secondary | ICD-10-CM | POA: Diagnosis not present

## 2017-04-15 DIAGNOSIS — M810 Age-related osteoporosis without current pathological fracture: Secondary | ICD-10-CM | POA: Insufficient documentation

## 2017-04-15 DIAGNOSIS — Z79899 Other long term (current) drug therapy: Secondary | ICD-10-CM | POA: Diagnosis not present

## 2017-04-15 DIAGNOSIS — L821 Other seborrheic keratosis: Secondary | ICD-10-CM | POA: Diagnosis not present

## 2017-04-15 NOTE — Progress Notes (Signed)
Daily Session Note  Patient Details  Name: SHREE ESPEY MRN: [id number] Date of Birth: 01-01-1932 Referring Provider:     Cardiac Rehab from 02/20/2017 in [facility] Cardiac and Pulmonary Rehab  Referring Provider  End      Encounter Date: 04/15/2017  Check In: Session Check In - 04/15/17 0819      Check-In   Location  [facility]-Cardiac & Pulmonary Rehab    Staff Present  Alberteen Sam, MA, RCEP, CCRP, Exercise Physiologist;Amanda Oletta Darter, BA, ACSM CEP, Exercise Physiologist;Susanne Bice, RN, BSN, CCRP    Supervising physician immediately available to respond to emergencies  See telemetry face sheet for immediately available ER MD    Medication changes reported      No    Fall or balance concerns reported     No    Warm-up and Cool-down  Performed on first and last piece of equipment    Resistance Training Performed  Yes    VAD Patient?  No      Pain Assessment   Currently in Pain?  No/denies          Social History   Tobacco Use  Smoking Status Former Smoker  . Last attempt to quit: 03/11/1968  . Years since quitting: 49.1  Smokeless Tobacco Never Used    Goals Met:  Independence with exercise equipment Exercise tolerated well No report of cardiac concerns or symptoms Strength training completed today  Goals Unmet:  Not Applicable  Comments: Pt able to follow exercise prescription today without complaint.  Will continue to monitor for progression.    Dr. Emily Filbert is Medical Director for [place] and [facility].

## 2017-04-15 NOTE — Telephone Encounter (Signed)
Left VM requesting pt to the call office back  CRM created

## 2017-04-17 DIAGNOSIS — Z79899 Other long term (current) drug therapy: Secondary | ICD-10-CM | POA: Diagnosis not present

## 2017-04-17 DIAGNOSIS — Z923 Personal history of irradiation: Secondary | ICD-10-CM | POA: Diagnosis not present

## 2017-04-17 DIAGNOSIS — I214 Non-ST elevation (NSTEMI) myocardial infarction: Secondary | ICD-10-CM

## 2017-04-17 DIAGNOSIS — Z7982 Long term (current) use of aspirin: Secondary | ICD-10-CM | POA: Diagnosis not present

## 2017-04-17 DIAGNOSIS — I252 Old myocardial infarction: Secondary | ICD-10-CM | POA: Diagnosis not present

## 2017-04-17 DIAGNOSIS — K219 Gastro-esophageal reflux disease without esophagitis: Secondary | ICD-10-CM | POA: Diagnosis not present

## 2017-04-17 DIAGNOSIS — Z7902 Long term (current) use of antithrombotics/antiplatelets: Secondary | ICD-10-CM | POA: Diagnosis not present

## 2017-04-17 NOTE — Progress Notes (Signed)
Daily Session Note  Patient Details  Name: BRITTLYN CLOE MRN: [id number] Date of Birth: 09-Dec-1931 Referring Provider:     Cardiac Rehab from 02/20/2017 in [facility] Cardiac and Pulmonary Rehab  Referring Provider  End      Encounter Date: 04/17/2017  Check In: Session Check In - 04/17/17 0844      Check-In   Location  [facility]-Cardiac & Pulmonary Rehab    Staff Present  Alberteen Sam, MA, RCEP, CCRP, Exercise Physiologist;Keyonte Cookston Oletta Darter, BA, ACSM CEP, Exercise Physiologist;Carroll Enterkin, RN, BSN    Supervising physician immediately available to respond to emergencies  See telemetry face sheet for immediately available ER MD    Medication changes reported      No    Fall or balance concerns reported     No    Warm-up and Cool-down  Performed on first and last piece of equipment    Resistance Training Performed  Yes    VAD Patient?  No      Pain Assessment   Currently in Pain?  No/denies    Multiple Pain Sites  No        Exercise Prescription Changes - 04/16/17 1500      Response to Exercise   Blood Pressure (Admit)  134/56    Blood Pressure (Exercise)  148/76    Blood Pressure (Exit)  126/64    Heart Rate (Admit)  98 bpm    Heart Rate (Exercise)  117 bpm    Heart Rate (Exit)  91 bpm    Rating of Perceived Exertion (Exercise)  15    Symptoms  none    Duration  Continue with 30 min of aerobic exercise without signs/symptoms of physical distress.    Intensity  THRR unchanged      Progression   Progression  Continue to progress workloads to maintain intensity without signs/symptoms of physical distress.    Average METs  2.08      Resistance Training   Training Prescription  Yes    Weight  3 lbs    Reps  10-15      Interval Training   Interval Training  No      Treadmill   MPH  1.5    Grade  0.5    Minutes  15    METs  2.25      NuStep   Level  2    Minutes  15    METs  2      Biostep-RELP   Level  2    Minutes  15    METs  2      Home Exercise Plan   Plans to continue exercise at  [employer] (comment) walking and [employer]    Frequency  Add 2 additional days to program exercise sessions.    Initial Home Exercises Provided  04/01/17       Social History   Tobacco Use  Smoking Status Former Smoker  . Last attempt to quit: 03/11/1968  . Years since quitting: 49.1  Smokeless Tobacco Never Used    Goals Met:  Independence with exercise equipment Exercise tolerated well No report of cardiac concerns or symptoms Strength training completed today  Goals Unmet:  Not Applicable  Comments: Pt able to follow exercise prescription today without complaint.  Will continue to monitor for progression.    Dr. Emily Filbert is Medical Director for [place] and [facility].

## 2017-04-22 ENCOUNTER — Encounter: Payer: Medicare Other | Admitting: *Deleted

## 2017-04-22 DIAGNOSIS — Z7982 Long term (current) use of aspirin: Secondary | ICD-10-CM | POA: Diagnosis not present

## 2017-04-22 DIAGNOSIS — Z79899 Other long term (current) drug therapy: Secondary | ICD-10-CM | POA: Diagnosis not present

## 2017-04-22 DIAGNOSIS — Z923 Personal history of irradiation: Secondary | ICD-10-CM | POA: Diagnosis not present

## 2017-04-22 DIAGNOSIS — I252 Old myocardial infarction: Secondary | ICD-10-CM | POA: Diagnosis not present

## 2017-04-22 DIAGNOSIS — I214 Non-ST elevation (NSTEMI) myocardial infarction: Secondary | ICD-10-CM

## 2017-04-22 DIAGNOSIS — K219 Gastro-esophageal reflux disease without esophagitis: Secondary | ICD-10-CM | POA: Diagnosis not present

## 2017-04-22 DIAGNOSIS — Z7902 Long term (current) use of antithrombotics/antiplatelets: Secondary | ICD-10-CM | POA: Diagnosis not present

## 2017-04-22 NOTE — Progress Notes (Signed)
Daily Session Note  Patient Details  Name: Jenna Young MRN: [id number] Date of Birth: 17-Dec-1931 Referring Provider:     Cardiac Rehab from 02/20/2017 in [facility] Cardiac and Pulmonary Rehab  Referring Provider  End      Encounter Date: 04/22/2017  Check In: Session Check In - 04/22/17 0846      Check-In   Location  [facility]-Cardiac & Pulmonary Rehab    Staff Present  Alberteen Sam, MA, RCEP, CCRP, Exercise Physiologist;Amanda Oletta Darter, BA, ACSM CEP, Exercise Physiologist;Susanne Bice, RN, BSN, CCRP    Supervising physician immediately available to respond to emergencies  See telemetry face sheet for immediately available ER MD    Medication changes reported      No    Fall or balance concerns reported     No    Warm-up and Cool-down  Performed on first and last piece of equipment    Resistance Training Performed  Yes    VAD Patient?  No      Pain Assessment   Currently in Pain?  No/denies          Social History   Tobacco Use  Smoking Status Former Smoker  . Last attempt to quit: 03/11/1968  . Years since quitting: 49.1  Smokeless Tobacco Never Used    Goals Met:  Independence with exercise equipment Exercise tolerated well No report of cardiac concerns or symptoms Strength training completed today  Goals Unmet:  Not Applicable  Comments: Pt able to follow exercise prescription today without complaint.  Will continue to monitor for progression.    Dr. Emily Filbert is Medical Director for [place] and [facility].

## 2017-04-23 ENCOUNTER — Telehealth: Payer: Self-pay | Admitting: Family Medicine

## 2017-04-23 NOTE — Telephone Encounter (Signed)
Note done and in IN box

## 2017-04-23 NOTE — Telephone Encounter (Signed)
Copied from [place]. Topic: Quick Communication - See Telephone Encounter >> Apr 23, 2017  4:05 PM Antonieta Iba C wrote:   CRM for notification. See Telephone encounter for: pt says that she had a lumpectomy and is having a special bra made at [employer] by [place]. Pt would like to know if office could fax over that she has had a lumpectomy and boutique will special make bra for pt.   Phone: [phone]  Fax: [phone]  04/23/17.

## 2017-04-24 DIAGNOSIS — K219 Gastro-esophageal reflux disease without esophagitis: Secondary | ICD-10-CM | POA: Diagnosis not present

## 2017-04-24 DIAGNOSIS — Z7902 Long term (current) use of antithrombotics/antiplatelets: Secondary | ICD-10-CM | POA: Diagnosis not present

## 2017-04-24 DIAGNOSIS — I214 Non-ST elevation (NSTEMI) myocardial infarction: Secondary | ICD-10-CM

## 2017-04-24 DIAGNOSIS — I252 Old myocardial infarction: Secondary | ICD-10-CM | POA: Diagnosis not present

## 2017-04-24 DIAGNOSIS — Z79899 Other long term (current) drug therapy: Secondary | ICD-10-CM | POA: Diagnosis not present

## 2017-04-24 DIAGNOSIS — Z7982 Long term (current) use of aspirin: Secondary | ICD-10-CM | POA: Diagnosis not present

## 2017-04-24 DIAGNOSIS — Z923 Personal history of irradiation: Secondary | ICD-10-CM | POA: Diagnosis not present

## 2017-04-24 NOTE — Progress Notes (Signed)
Daily Session Note  Patient Details  Name: BREELYN ICARD MRN: [id number] Date of Birth: [birth year] Referring Provider:     Cardiac Rehab from 02/20/2017 in [facility] Cardiac and Pulmonary Rehab  Referring Provider  End      Encounter Date: 04/24/2017  Check In: Session Check In - 04/24/17 0838      Check-In   Location  [facility]-Cardiac & Pulmonary Rehab    Staff Present  Alberteen Sam, MA, RCEP, CCRP, Exercise Physiologist;Ilias Stcharles Oletta Darter, BA, ACSM CEP, Exercise Physiologist;Carroll Enterkin, RN, BSN    Supervising physician immediately available to respond to emergencies  See telemetry face sheet for immediately available ER MD    Medication changes reported      No    Fall or balance concerns reported     No    Warm-up and Cool-down  Performed on first and last piece of equipment    Resistance Training Performed  Yes    VAD Patient?  No      Pain Assessment   Currently in Pain?  No/denies    Multiple Pain Sites  No          Social History   Tobacco Use  Smoking Status Former Smoker  . Last attempt to quit: 03/11/1968  . Years since quitting: 49.1  Smokeless Tobacco Never Used    Goals Met:  Independence with exercise equipment Exercise tolerated well Personal goals reviewed No report of cardiac concerns or symptoms Strength training completed today  Goals Unmet:  Not Applicable  Comments: Pt able to follow exercise prescription today without complaint.  Will continue to monitor for progression.  See ITP for goal review  Dr. Emily Filbert is Medical Director for [place] and [facility].

## 2017-04-24 NOTE — Telephone Encounter (Signed)
Letter faxed to [employer] at [phone].

## 2017-04-29 DIAGNOSIS — I252 Old myocardial infarction: Secondary | ICD-10-CM | POA: Diagnosis not present

## 2017-04-29 DIAGNOSIS — Z79899 Other long term (current) drug therapy: Secondary | ICD-10-CM | POA: Diagnosis not present

## 2017-04-29 DIAGNOSIS — I214 Non-ST elevation (NSTEMI) myocardial infarction: Secondary | ICD-10-CM

## 2017-04-29 DIAGNOSIS — Z7982 Long term (current) use of aspirin: Secondary | ICD-10-CM | POA: Diagnosis not present

## 2017-04-29 DIAGNOSIS — K219 Gastro-esophageal reflux disease without esophagitis: Secondary | ICD-10-CM | POA: Diagnosis not present

## 2017-04-29 DIAGNOSIS — Z7902 Long term (current) use of antithrombotics/antiplatelets: Secondary | ICD-10-CM | POA: Diagnosis not present

## 2017-04-29 DIAGNOSIS — Z923 Personal history of irradiation: Secondary | ICD-10-CM | POA: Diagnosis not present

## 2017-04-29 NOTE — Progress Notes (Signed)
Daily Session Note  Patient Details  Name: Jenna Young MRN: [id number] Date of Birth: [birth year] Referring Provider:     Cardiac Rehab from 02/20/2017 in [facility] Cardiac and Pulmonary Rehab  Referring Provider  End      Encounter Date: 04/29/2017  Check In: Session Check In - 04/29/17 0906      Check-In   Location  [facility]-Cardiac & Pulmonary Rehab    Staff Present  Alberteen Sam, MA, RCEP, CCRP, Exercise Physiologist;Amanda Oletta Darter, BA, ACSM CEP, Exercise Physiologist;Susanne Bice, RN, BSN, CCRP    Supervising physician immediately available to respond to emergencies  See telemetry face sheet for immediately available ER MD    Medication changes reported      No    Fall or balance concerns reported     No    Warm-up and Cool-down  Performed on first and last piece of equipment    Resistance Training Performed  Yes    VAD Patient?  No      Pain Assessment   Currently in Pain?  No/denies    Multiple Pain Sites  No          Social History   Tobacco Use  Smoking Status Former Smoker  . Last attempt to quit: 03/11/1968  . Years since quitting: 49.1  Smokeless Tobacco Never Used    Goals Met:  Independence with exercise equipment Exercise tolerated well No report of cardiac concerns or symptoms Strength training completed today  Goals Unmet:  Not Applicable  Comments: Pt able to follow exercise prescription today without complaint.  Will continue to monitor for progression.    Dr. Emily Filbert is Medical Director for [place] and [facility].

## 2017-05-01 DIAGNOSIS — K219 Gastro-esophageal reflux disease without esophagitis: Secondary | ICD-10-CM | POA: Diagnosis not present

## 2017-05-01 DIAGNOSIS — Z79899 Other long term (current) drug therapy: Secondary | ICD-10-CM | POA: Diagnosis not present

## 2017-05-01 DIAGNOSIS — Z7902 Long term (current) use of antithrombotics/antiplatelets: Secondary | ICD-10-CM | POA: Diagnosis not present

## 2017-05-01 DIAGNOSIS — I252 Old myocardial infarction: Secondary | ICD-10-CM | POA: Diagnosis not present

## 2017-05-01 DIAGNOSIS — I214 Non-ST elevation (NSTEMI) myocardial infarction: Secondary | ICD-10-CM

## 2017-05-01 DIAGNOSIS — Z923 Personal history of irradiation: Secondary | ICD-10-CM | POA: Diagnosis not present

## 2017-05-01 DIAGNOSIS — Z7982 Long term (current) use of aspirin: Secondary | ICD-10-CM | POA: Diagnosis not present

## 2017-05-01 NOTE — Progress Notes (Signed)
Daily Session Note  Patient Details  Name: Jenna Young MRN: [id number] Date of Birth: 21-Jun-1931 Referring Provider:     Cardiac Rehab from 02/20/2017 in [facility] Cardiac and Pulmonary Rehab  Referring Provider  End      Encounter Date: 05/01/2017  Check In: Session Check In - 05/01/17 0841      Check-In   Location  [facility]-Cardiac & Pulmonary Rehab    Staff Present  Alberteen Sam, MA, RCEP, CCRP, Exercise Physiologist;Amanda Oletta Darter, BA, ACSM CEP, Exercise Physiologist;Carroll Enterkin, RN, BSN    Supervising physician immediately available to respond to emergencies  See telemetry face sheet for immediately available ER MD    Medication changes reported      No    Fall or balance concerns reported     No    Warm-up and Cool-down  Performed on first and last piece of equipment    Resistance Training Performed  Yes    VAD Patient?  No      Pain Assessment   Currently in Pain?  No/denies    Multiple Pain Sites  No          Social History   Tobacco Use  Smoking Status Former Smoker  . Last attempt to quit: 03/11/1968  . Years since quitting: 49.1  Smokeless Tobacco Never Used    Goals Met:  Independence with exercise equipment Exercise tolerated well No report of cardiac concerns or symptoms Strength training completed today  Goals Unmet:  Not Applicable  Comments: Pt able to follow exercise prescription today without complaint.  Will continue to monitor for progression.    Dr. Emily Filbert is Medical Director for [place] and [facility].

## 2017-05-05 ENCOUNTER — Telehealth: Payer: Self-pay | Admitting: Cardiovascular Disease

## 2017-05-05 NOTE — Telephone Encounter (Signed)
Pt c/o medication issue:  1. Name of Medication: atorvastatin  2. How are you currently taking this medication (dosage and times per day)? 40 mg po q d   3. Are you having a reaction (difficulty breathing--STAT)? yes  4. What is your medication issue? Body hurts all over cannot tolerate can patient take something else

## 2017-05-06 VITALS — Ht 62.5 in | Wt 138.0 lb

## 2017-05-06 DIAGNOSIS — Z923 Personal history of irradiation: Secondary | ICD-10-CM | POA: Diagnosis not present

## 2017-05-06 DIAGNOSIS — Z7902 Long term (current) use of antithrombotics/antiplatelets: Secondary | ICD-10-CM | POA: Diagnosis not present

## 2017-05-06 DIAGNOSIS — Z79899 Other long term (current) drug therapy: Secondary | ICD-10-CM | POA: Diagnosis not present

## 2017-05-06 DIAGNOSIS — I214 Non-ST elevation (NSTEMI) myocardial infarction: Secondary | ICD-10-CM

## 2017-05-06 DIAGNOSIS — Z7982 Long term (current) use of aspirin: Secondary | ICD-10-CM | POA: Diagnosis not present

## 2017-05-06 DIAGNOSIS — I252 Old myocardial infarction: Secondary | ICD-10-CM | POA: Diagnosis not present

## 2017-05-06 DIAGNOSIS — K219 Gastro-esophageal reflux disease without esophagitis: Secondary | ICD-10-CM | POA: Diagnosis not present

## 2017-05-06 NOTE — Telephone Encounter (Signed)
Left voicemail message to call back  

## 2017-05-06 NOTE — Telephone Encounter (Signed)
Patient states that she was taking lipitor and she was having bone pain and muscle soreness. She reports that she was "stiff" and was having difficulty walking even. She stopped lipitor and she is no longer sore or having problems. She would like to know if there is another medication she could try. Advised that I would send message to Dr. Rockey Situ and would be in touch with her if he should have other recommendations. She verbalized understanding with no further questions or concerns at this time. Confirmed upcoming appointment as well.

## 2017-05-06 NOTE — Progress Notes (Signed)
Daily Session Note  Patient Details  Name: Jenna Young MRN: [id number] Date of Birth: 12/22/1931 Referring Provider:     Cardiac Rehab from 02/20/2017 in [facility]  Referring Provider  End      Encounter Date: 05/06/2017  Check In:      Social History   Tobacco Use  Smoking Status Former Smoker  . Last attempt to quit: 03/11/1968  . Years since quitting: 49.1  Smokeless Tobacco Never Used    Goals Met:  Independence with exercise equipment Exercise tolerated well No report of cardiac concerns or symptoms Strength training completed today  Goals Unmet:  Not Applicable  Comments:  Adrian Name 02/20/17 1445 05/06/17 0927       6 Minute Walk   Phase  -  Discharge    Distance  780 feet  1142 feet    Distance % Change  -  46 %    Distance Feet Change  -  362 ft    Walk Time  6 minutes  6 minutes    # of Rest Breaks  0  0    MPH  1.5  -    METS  1.5  -    RPE  11  14    Perceived Dyspnea   0  1    VO2 Peak  5.2  8.98    Symptoms  No  No    Resting HR  79 bpm  80 bpm    Resting BP  136/58  154/60    Resting Oxygen Saturation   8 %  97 %    Exercise Oxygen Saturation  during 6 min walk  100 %  96 %    Max Ex. HR  102 bpm  118 bpm    Max Ex. BP  148/50  180/64    2 Minute Post BP  130/52  -         Dr. Emily Filbert is Medical Director for [place] and [facility].

## 2017-05-06 NOTE — Telephone Encounter (Signed)
Pt returning our call please call

## 2017-05-07 ENCOUNTER — Encounter: Payer: Self-pay | Admitting: *Deleted

## 2017-05-07 DIAGNOSIS — I214 Non-ST elevation (NSTEMI) myocardial infarction: Secondary | ICD-10-CM

## 2017-05-07 NOTE — Progress Notes (Signed)
Cardiac Individual Treatment Plan  Patient Details  Name: Jenna Young MRN: [id number] Date of Birth: 04-22-1931 Referring Provider:     Cardiac Rehab from 02/20/2017 in [facility] Cardiac and Pulmonary Rehab  Referring Provider  End      Initial Encounter Date:    Cardiac Rehab from 02/20/2017 in [facility] Cardiac and Pulmonary Rehab  Date  02/20/17  Referring Provider  End      Visit Diagnosis: NSTEMI (non-ST elevated myocardial infarction) [facility])  Patient's Home Medications on Admission:  Current Outpatient Medications:  .  aspirin (ASPIRIN EC) 81 MG EC tablet, Take 81 mg by mouth daily. Swallow whole. , Disp: , Rfl:  .  Cholecalciferol (VITAMIN D) 2000 UNITS CAPS, Take 1 capsule by mouth daily.  , Disp: , Rfl:  .  clopidogrel (PLAVIX) 75 MG tablet, TAKE ONE TABLET BY MOUTH DAILY WITH BREAKFAST, Disp: 30 tablet, Rfl: 6 .  cyanocobalamin (,VITAMIN B-12,) 1000 MCG/ML injection, Inject 1,000 mcg into the muscle every 3 (three) months., Disp: , Rfl:  .  isosorbide mononitrate (IMDUR) 30 MG 24 hr tablet, Take 0.5 tablets (15 mg total) by mouth daily., Disp: 15 tablet, Rfl: 0 .  nitroGLYCERIN (NITROSTAT) 0.4 MG SL tablet, Place 1 tablet (0.4 mg total) under the tongue every 5 (five) minutes x 3 doses as needed for chest pain., Disp: 30 tablet, Rfl: 2 .  omeprazole (PRILOSEC) 20 MG capsule, Take 1 capsule (20 mg total) by mouth daily. (Patient taking differently: Take 20 mg by mouth daily as needed (acid reflux). ), Disp: 30 capsule, Rfl: 5  Past Medical History: Past Medical History:  Diagnosis Date  . Allergic rhinitis   . Anemia   . Anxiety   . GERD (gastroesophageal reflux disease)   . History of radiation therapy 04/29/16- 05/24/16   Left Breast 40.05 Gy in 15 fractions, Left Breast boost 10 Gy in 5 fractions.   . Hyperlipidemia   . Hypertension    treated by Dr Thurnell Garbe recently with syncope episode  . Osteopenia   . Osteoporosis     Tobacco Use: Social History   Tobacco  Use  Smoking Status Former Smoker  . Last attempt to quit: 03/11/1968  . Years since quitting: 49.1  Smokeless Tobacco Never Used    Labs: Recent Chemical engineer    Labs for ITP Cardiac and Pulmonary Rehab Latest Ref Rng & Units 02/21/2014 03/17/2015 03/21/2016 02/11/2017 03/31/2017   Cholestrol 0 - 200 mg/dL 221(H) 200 205(H) 173 125   LDLCALC 0 - 99 mg/dL 137(H) 115(H) 125(H) 100(H) 56   LDLDIRECT mg/dL - - - - -   HDL >39.00 mg/dL 48.60 51.50 56.50 53 50.50   Trlycerides 0.0 - 149.0 mg/dL 179.0(H) 167.0(H) 117.0 98 92.0   Hemoglobin A1c 4.6 - 6.1 % - - - - -   TCO2 0 - 100 mmol/L - - - - -       Exercise Target Goals:    Exercise Program Goal: Individual exercise prescription set using results from initial 6 min walk test and THRR while considering  patient's activity barriers and safety.   Exercise Prescription Goal: Initial exercise prescription builds to 30-45 minutes a day of aerobic activity, 2-3 days per week.  Home exercise guidelines will be given to patient during program as part of exercise prescription that the participant will acknowledge.  Activity Barriers & Risk Stratification: Activity Barriers & Cardiac Risk Stratification - 02/20/17 1424      Activity Barriers & Cardiac Risk Stratification  Activity Barriers  Back Problems History of compression fracture.  Exercise helps.    Cardiac Risk Stratification  High       6 Minute Walk: 6 Minute Walk    Row Name 02/20/17 1445 05/06/17 0927       6 Minute Walk   Phase  -  Discharge    Distance  780 feet  1142 feet    Distance % Change  -  46 %    Distance Feet Change  -  362 ft    Walk Time  6 minutes  6 minutes    # of Rest Breaks  0  0    MPH  1.5  -    METS  1.5  -    RPE  11  14    Perceived Dyspnea   0  1    VO2 Peak  5.2  8.98    Symptoms  No  No    Resting HR  79 bpm  80 bpm    Resting BP  136/58  154/60    Resting Oxygen Saturation   8 %  97 %    Exercise Oxygen Saturation  during 6 min  walk  100 %  96 %    Max Ex. HR  102 bpm  118 bpm    Max Ex. BP  148/50  180/64    2 Minute Post BP  130/52  -       Oxygen Initial Assessment:   Oxygen Re-Evaluation:   Oxygen Discharge (Final Oxygen Re-Evaluation):   Initial Exercise Prescription: Initial Exercise Prescription - 02/20/17 1400      Date of Initial Exercise RX and Referring Provider   Date  02/20/17    Referring Provider  End      Treadmill   MPH  1    Grade  0    Minutes  15    METs  1.77      NuStep   Level  1    SPM  80    Minutes  15    METs  1.5      Biostep-RELP   Level  1    SPM  50    Minutes  15    METs  1.5      Prescription Details   Frequency (times per week)  3    Duration  Progress to 45 minutes of aerobic exercise without signs/symptoms of physical distress      Intensity   THRR 40-80% of Max Heartrate  102-125    Ratings of Perceived Exertion  11-13    Perceived Dyspnea  0-4      Resistance Training   Training Prescription  Yes    Weight  2 lb    Reps  10-15       Perform Capillary Blood Glucose checks as needed.  Exercise Prescription Changes: Exercise Prescription Changes    Row Name 02/20/17 1400 03/18/17 1600 03/31/17 1600 04/01/17 0900 04/16/17 1500     Response to Exercise   Blood Pressure (Admit)  135/58  116/62  122/72  -  134/56   Blood Pressure (Exercise)  148/50  130/58  128/58  -  148/76   Blood Pressure (Exit)  130/52  112/62  -  -  126/64   Heart Rate (Admit)  79 bpm  98 bpm  90 bpm  -  98 bpm   Heart Rate (Exercise)  102 bpm  106 bpm  100 bpm  -  117 bpm   Heart Rate (Exit)  73 bpm  86 bpm  82 bpm  -  91 bpm   Oxygen Saturation (Admit)  98 %  -  -  -  -   Oxygen Saturation (Exit)  100 %  -  -  -  -   Rating of Perceived Exertion (Exercise)  '11  12  13  '$ -  15   Symptoms  -  none  none  -  none   Duration  -  Continue with 30 min of aerobic exercise without signs/symptoms of physical distress.  Continue with 30 min of aerobic exercise without  signs/symptoms of physical distress.  -  Continue with 30 min of aerobic exercise without signs/symptoms of physical distress.   Intensity  -  THRR unchanged  THRR unchanged  -  THRR unchanged     Progression   Progression  -  Continue to progress workloads to maintain intensity without signs/symptoms of physical distress.  Continue to progress workloads to maintain intensity without signs/symptoms of physical distress.  -  Continue to progress workloads to maintain intensity without signs/symptoms of physical distress.   Average METs  -  2.08  2.05  -  2.08     Resistance Training   Training Prescription  -  Yes  Yes  -  Yes   Weight  -  2 lb  2 lb  -  3 lbs   Reps  -  10-15  10-15  -  10-15     Interval Training   Interval Training  -  No  No  -  No     Treadmill   MPH  -  1.5  -  -  1.5   Grade  -  0  -  -  0.5   Minutes  -  15  -  -  15   METs  -  2.15  -  -  2.25     NuStep   Level  -  2  2  -  2   Minutes  -  15  15  -  15   METs  -  2.1  2.1  -  2     Biostep-RELP   Level  -  2  2  -  2   Minutes  -  15  15  -  15   METs  -  2  2  -  2     Home Exercise Plan   Plans to continue exercise at  -  -  -  [employer] (comment) walking and [employer] (comment) walking and [employer]   Frequency  -  -  -  Add 2 additional days to program exercise sessions.  Add 2 additional days to program exercise sessions.   Initial Home Exercises Provided  -  -  -  04/01/17  04/01/17   Row Name 04/29/17 1500             Response to Exercise   Blood Pressure (Admit)  146/74       Blood Pressure (Exercise)  142/68       Blood Pressure (Exit)  126/60       Heart Rate (Admit)  99 bpm       Heart Rate (Exercise)  108 bpm       Heart Rate (Exit)  97 bpm       Rating of Perceived Exertion (  Exercise)  13       Symptoms  none       Duration  Continue with 30 min of aerobic exercise without signs/symptoms of physical distress.       Intensity  THRR  unchanged         Progression   Progression  Continue to progress workloads to maintain intensity without signs/symptoms of physical distress.       Average METs  2.15         Resistance Training   Training Prescription  Yes       Weight  3 lbs       Reps  10-15         Interval Training   Interval Training  No         Treadmill   MPH  1.5       Grade  0.5       Minutes  15       METs  2.25         NuStep   Level  2       Minutes  15       METs  2.1         Biostep-RELP   Level  4       Minutes  15       METs  2         Home Exercise Plan   Plans to continue exercise at  [employer] (comment) walking and [employer]       Frequency  Add 2 additional days to program exercise sessions.       Initial Home Exercises Provided  04/01/17          Exercise Comments: Exercise Comments    Row Name 03/06/17 1203           Exercise Comments  First full day of exercise!  Patient was oriented to gym and equipment including functions, settings, policies, and procedures.  Patient's individual exercise prescription and treatment plan were reviewed.  All starting workloads were established based on the results of the 6 minute walk test done at initial orientation visit.  The plan for exercise progression was also introduced and progression will be customized based on patient's performance and goals.          Exercise Goals and Review: Exercise Goals    Row Name 02/20/17 1444             Exercise Goals   Increase Physical Activity  Yes       Intervention  Provide advice, education, support and counseling about physical activity/exercise needs.;Develop an individualized exercise prescription for aerobic and resistive training based on initial evaluation findings, risk stratification, comorbidities and participant's personal goals.       Expected Outcomes  Achievement of increased cardiorespiratory fitness and enhanced flexibility, muscular endurance and strength  shown through measurements of functional capacity and personal statement of participant.       Increase Strength and Stamina  Yes       Intervention  Provide advice, education, support and counseling about physical activity/exercise needs.;Develop an individualized exercise prescription for aerobic and resistive training based on initial evaluation findings, risk stratification, comorbidities and participant's personal goals.       Expected Outcomes  Achievement of increased cardiorespiratory fitness and enhanced flexibility, muscular endurance and strength shown through measurements of functional capacity and personal statement of participant.       Able to understand and use  rate of perceived exertion (RPE) scale  Yes       Intervention  Provide education and explanation on how to use RPE scale       Expected Outcomes  Short Term: Able to use RPE daily in rehab to express subjective intensity level;Long Term:  Able to use RPE to guide intensity level when exercising independently       Able to understand and use Dyspnea scale  Yes       Intervention  Provide education and explanation on how to use Dyspnea scale       Expected Outcomes  Short Term: Able to use Dyspnea scale daily in rehab to express subjective sense of shortness of breath during exertion;Long Term: Able to use Dyspnea scale to guide intensity level when exercising independently       Knowledge and understanding of Target Heart Rate Range (THRR)  Yes       Intervention  Provide education and explanation of THRR including how the numbers were predicted and where they are located for reference       Expected Outcomes  Short Term: Able to state/look up THRR;Long Term: Able to use THRR to govern intensity when exercising independently;Short Term: Able to use daily as guideline for intensity in rehab       Able to check pulse independently  Yes       Intervention  Provide education and demonstration on how to check pulse in carotid and  radial arteries.;Review the importance of being able to check your own pulse for safety during independent exercise       Expected Outcomes  Short Term: Able to explain why pulse checking is important during independent exercise;Long Term: Able to check pulse independently and accurately       Understanding of Exercise Prescription  Yes       Intervention  Provide education, explanation, and written materials on patient's individual exercise prescription       Expected Outcomes  Short Term: Able to explain program exercise prescription;Long Term: Able to explain home exercise prescription to exercise independently          Exercise Goals Re-Evaluation : Exercise Goals Re-Evaluation    Row Name 03/06/17 1204 03/18/17 1648 03/31/17 1653 04/01/17 0821 04/16/17 1515     Exercise Goal Re-Evaluation   Exercise Goals Review  Understanding of Exercise Prescription;Knowledge and understanding of Target Heart Rate Range (THRR);Able to understand and use rate of perceived exertion (RPE) scale  Increase Strength and Stamina;Understanding of Exercise Prescription;Increase Physical Activity  Increase Strength and Stamina;Understanding of Exercise Prescription;Increase Physical Activity  Increase Physical Activity;Increase Strength and Stamina;Understanding of Exercise Prescription  Increase Physical Activity;Increase Strength and Stamina;Understanding of Exercise Prescription   Comments  Reviewed RPE scale, THR and program prescription with pt today.  Pt voiced understanding and was given a copy of goals to take home.   Berda is off to a good start in rehab.  She has completed three full days of exercise.  So far she is doing well with her workloads.  We will try to start increasing her workloads and continue to monitor her progression.   Shawnna has only attended once since last review.  She continues to do well and upon return we will start to increase workloads some more.  We will continue to monitor her  progression.   Kenneshia feels that exercise is is helping her.  She hopes it helps enough to keep her off the operating table for CABG.  She  has not had any of the chest pain/burning with exercise.  She has not been exercising at home.  Reviewed home exercise with pt today.  Pt plans to walk and returnto [employer] for exercise.  Reviewed THR, pulse, RPE, sign and symptoms, NTG use, and when to call 911 or MD.  Also discussed weather considerations and indoor options.  Pt voiced understanding.  Aleyda has been doing well in rehab.  She is up to 2 METs on the NuStep.  We will continue to monitor her progress.    Expected Outcomes  Short: Use RPE daily to regulate intensity.  Long: Follow program prescription in THR.  Short: Increase some workloads.  Long: Continue to work in increasing physcial activity  Short: Increase some workloads.  Long: Continue to work in Psychologist, counselling activity  Short: Return to [employer] at least two days a week.  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   Short: Continue to move up workloads.  Long: Continue to build strength and stamina.    [place] Name 04/24/17 1055 04/29/17 1537           Exercise Goal Re-Evaluation   Exercise Goals Review  Increase Physical Activity;Increase Strength and Stamina;Understanding of Exercise Prescription  Increase Physical Activity;Increase Strength and Stamina;Understanding of Exercise Prescription      Comments  Kassadie continues to do well in rehab.  Seh is getting stronger and has more stamina.  She was actually able to sweep and mop the floors yesterday!!  She has been walking some at home.   Linday had been doing well in rehab. She is now up to 2.1 METs on the NuStep.  We will continue to work with her to increase her workloads.  We will continue to monitor her progression.       Expected Outcomes  Short: continue to try to get back to gym.  Long: Continue to build strength and stamina.   Short: Increase workload on NuStep.   Long: Continue work on Education administrator.          Discharge Exercise Prescription (Final Exercise Prescription Changes): Exercise Prescription Changes - 04/29/17 1500      Response to Exercise   Blood Pressure (Admit)  146/74    Blood Pressure (Exercise)  142/68    Blood Pressure (Exit)  126/60    Heart Rate (Admit)  99 bpm    Heart Rate (Exercise)  108 bpm    Heart Rate (Exit)  97 bpm    Rating of Perceived Exertion (Exercise)  13    Symptoms  none    Duration  Continue with 30 min of aerobic exercise without signs/symptoms of physical distress.    Intensity  THRR unchanged      Progression   Progression  Continue to progress workloads to maintain intensity without signs/symptoms of physical distress.    Average METs  2.15      Resistance Training   Training Prescription  Yes    Weight  3 lbs    Reps  10-15      Interval Training   Interval Training  No      Treadmill   MPH  1.5    Grade  0.5    Minutes  15    METs  2.25      NuStep   Level  2    Minutes  15    METs  2.1      Biostep-RELP   Level  4  Minutes  15    METs  2      Home Exercise Plan   Plans to continue exercise at  [facility] (comment) walking and [employer]    Frequency  Add 2 additional days to program exercise sessions.    Initial Home Exercises Provided  04/01/17       Nutrition:  Target Goals: Understanding of nutrition guidelines, daily intake of sodium '1500mg'$ , cholesterol '200mg'$ , calories 30% from fat and 7% or less from saturated fats, daily to have 5 or more servings of fruits and vegetables.  Biometrics: Pre Biometrics - 02/20/17 1444      Pre Biometrics   Height  5' 2.5" (1.588 m)    Weight  138 lb 1.6 oz (62.6 kg)    Waist Circumference  32.5 inches    Hip Circumference  39 inches    Waist to Hip Ratio  0.83 %    BMI (Calculated)  24.84    Single Leg Stand  2.45 seconds      Post Biometrics - 05/06/17 6283       Post  Biometrics    Height  5' 2.5" (1.588 m)    Weight  138 lb (62.6 kg)    Waist Circumference  32.5 inches    Hip Circumference  39 inches    Waist to Hip Ratio  0.83 %    BMI (Calculated)  24.82    Single Leg Stand  6.5 seconds       Nutrition Therapy Plan and Nutrition Goals: Nutrition Therapy & Goals - 02/20/17 1426      Intervention Plan   Intervention  Prescribe, educate and counsel regarding individualized specific dietary modifications aiming towards targeted core components such as weight, hypertension, lipid management, diabetes, heart failure and other comorbidities.    Expected Outcomes  Short Term Goal: Understand basic principles of dietary content, such as calories, fat, sodium, cholesterol and nutrients.;Short Term Goal: A plan has been developed with personal nutrition goals set during dietitian appointment.;Long Term Goal: Adherence to prescribed nutrition plan.       Nutrition Assessments: Nutrition Assessments - 02/20/17 1426      MEDFICTS Scores   Pre Score  24       Nutrition Goals Re-Evaluation: Nutrition Goals Re-Evaluation    Row Name 04/01/17 0831 04/24/17 1058           Goals   Current Weight  135 lb (61.2 kg)  -      Nutrition Goal  Meet with dietician  Meet with dietician      Comment  Appt scheduled for 1/31 during class.   She has been eating a lot of chicken and would like some alternatives.   Collie Siad missed her appointment during class.  It has been rescheduled for 2/28 as that is the next time that Lattie Haw will be in class.       Expected Outcome  Short: Meet with dietican.  Long: Follow recommendations set out by nutritionist  Short: Meet with [place].  Long: Follow recommendations set out by nutritionist         Nutrition Goals Discharge (Final Nutrition Goals Re-Evaluation): Nutrition Goals Re-Evaluation - 04/24/17 1058      Goals   Nutrition Goal  Meet with dietician    Comment  Collie Siad missed her appointment during class.  It has been rescheduled for 2/28 as  that is the next time that Lattie Haw will be in class.     Expected Outcome  Short: Meet  with dietican.  Long: Follow recommendations set out by nutritionist       Psychosocial: Target Goals: Acknowledge presence or absence of significant depression and/or stress, maximize coping skills, provide positive support system. Participant is able to verbalize types and ability to use techniques and skills needed for reducing stress and depression.   Initial Review & Psychosocial Screening: Initial Psych Review & Screening - 02/20/17 1427      Initial Review   Current issues with  None Identified      Family Dynamics   Good Support System?  Yes Daughter lives in [place], son      Barriers   Psychosocial barriers to participate in program  There are no identifiable barriers or psychosocial needs.;The patient should benefit from training in stress management and relaxation.      Screening Interventions   Interventions  Yes;To provide support and resources with identified psychosocial needs;Provide feedback about the scores to participant;Encouraged to exercise    Expected Outcomes  Short Term goal: Utilizing psychosocial counselor, staff and physician to assist with identification of specific Stressors or current issues interfering with healing process. Setting desired goal for each stressor or current issue identified.;Long Term Goal: Stressors or current issues are controlled or eliminated.;Short Term goal: Identification and review with participant of any Quality of Life or Depression concerns found by scoring the questionnaire.;Long Term goal: The participant improves quality of Life and PHQ9 Scores as seen by post scores and/or verbalization of changes       Quality of Life Scores:  Quality of Life - 02/20/17 1429      Quality of Life Scores   Health/Function Pre  28.5 %    Socioeconomic Pre  30 %    Psych/Spiritual Pre  29.14 %    Family Pre  30 %    GLOBAL Pre  29.17 %      Scores of 19  and below usually indicate a poorer quality of life in these areas.  A difference of  2-3 points is a clinically meaningful difference.  A difference of 2-3 points in the total score of the Quality of Life Index has been associated with significant improvement in overall quality of life, self-image, physical symptoms, and general health in studies assessing change in quality of life.  PHQ-9: Recent Review Flowsheet Data    Depression screen [facility] 2/9 03/31/2017 02/20/2017 06/28/2016 04/17/2016 03/21/2016   Decreased Interest 0 0 0 0 0   Down, Depressed, Hopeless 0 0 0 0 0   PHQ - 2 Score 0 0 0 0 0   Altered sleeping 0 0 - - -   Tired, decreased energy 0 1 - - -   Change in appetite 0 0 - - -   Feeling bad or failure about yourself  0 0 - - -   Trouble concentrating 0 0 - - -   Moving slowly or fidgety/restless 0 0 - - -   Suicidal thoughts 0 0 - - -   PHQ-9 Score 0 1 - - -   Difficult doing work/chores Not difficult at all Not difficult at all - - -     Interpretation of Total Score  Total Score Depression Severity:  1-4 = Minimal depression, 5-9 = Mild depression, 10-14 = Moderate depression, 15-19 = Moderately severe depression, 20-27 = Severe depression   Psychosocial Evaluation and Intervention: Psychosocial Evaluation - 03/18/17 0937      Psychosocial Evaluation & Interventions   Interventions  Encouraged to exercise  with the program and follow exercise prescription    Comments  Counselor met with Ms. Nelon Inez Catalina) today for initial psychosocial evaluation.  She is an 84 year old who reports having a mild heart attack on 02/11/17.  She has a strong support system with adult children and grandchildren who live close by.  Lenah lives alone but her grandson and his wife stay with her at night.  She is actively involved in her local church community as well.  Teasha is scheduled to have catarac surgery in the next week and may be required to miss a few sessions in this program during recovery.   She reports sleeping well and her appetite has decreased -  although she reports not having lost any weight.  Caoimhe denies a history of depression or anxiety or any current symptoms. She is generally in a positive mood and reports minimal stress in her life at this time, other than her health.  Allisha has goals to increase her stamina and strength.  Staff will follow    Expected Outcomes  Vanice will benefit from consistent exercise to achieve her stated goals.  The educational and psychoeducational components will be helpful in understanding and coping more positively with her condition.     Continue Psychosocial Services   Follow up required by staff       Psychosocial Re-Evaluation: Psychosocial Re-Evaluation    [place] Name 04/01/17 0823 04/10/17 1008 04/24/17 1100         Psychosocial Re-Evaluation   Current issues with  Current Sleep Concerns;Current Stress Concerns  -  Current Sleep Concerns;Current Stress Concerns     Comments  Kennah has been feeling weak overall.  She has been sleeping well, but last night her leg kept jumping and kept her awake.  She had a wellness exam yesterday and told them about rehab and they were pleased that she is in here.  She continues to have minimal stress in her life.  Counselor met with Darcella for a follow up today.  She reports this program has been helpful and she has more energy since coming here.  Janeva continues to sleep well and maintains a positive attitude.  She reports some stress with her Yolanda Bonine and his significant other living in her home and not paying any rent.  Counselor brainstormed with Cori ways to set boundaries and healthy limits with her relatives.  She plans to implement a plan along with her daughter to follow through on this for her mental and emotional health.  Counselor commended Environmental education officer for her progress made and her commitment to consistency in exercise.    Ardie continues to do well in rehab and has more energy and stamina.  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  She continues to struggle with her grandson and boundaries.  This is her major stressor.  She continues to try to stay postive.  She is sleeping better and getting 6-8hrs a night.  She did not want to talk openly about her grandson today.     Expected Outcomes  Short: Attend stress lecture and sleep lecture.  Long: Continue to maintain postive outlook and exercise regularly.   Aleeyah will practice some healthy stress management strategies with the help of her daughter.  Sundee will continue to exercise for her overall health.    Short: Continue to work on setting boundaries for grandson.  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      Interventions  -  Stress management education  Encouraged to attend Cardiac Rehabilitation for the exercise;Stress management education     Continue Psychosocial Services   -  Follow up required by staff  Follow up required by counselor       Initial Review   Source of Stress Concerns  -  -  Family        Psychosocial Discharge (Final Psychosocial Re-Evaluation): Psychosocial Re-Evaluation - 04/24/17 1100      Psychosocial Re-Evaluation   Current issues with  Current Sleep Concerns;Current Stress Concerns    Comments  Kamoria continues to do well in rehab and has more energy and stamina.  She was able to mop floors yesterday!!  She continues to struggle with her grandson and boundaries.  This is her major stressor.  She continues to try to stay postive.  She is sleeping better and getting 6-8hrs a night.  She did not want to talk openly about her grandson today.    Expected Outcomes  Short: Continue to work on setting boundaries for grandson.  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     Interventions  Encouraged to attend Cardiac Rehabilitation for the exercise;Stress management education    Continue Psychosocial Services   Follow up required by counselor      Initial Review   Source of Stress Concerns  Family       Vocational  Rehabilitation: Provide vocational rehab assistance to qualifying candidates.   Vocational Rehab Evaluation & Intervention: Vocational Rehab - 02/20/17 1434      Initial Vocational Rehab Evaluation & Intervention   Assessment shows need for Vocational Rehabilitation  No       Education: Education Goals: Education classes will be provided on a variety of topics geared toward better understanding of heart health and risk factor modification. Participant will state understanding/return demonstration of topics presented as noted by education test scores.  Learning Barriers/Preferences: Learning Barriers/Preferences - 02/20/17 1433      Learning Barriers/Preferences   Learning Barriers  Hearing    Learning Preferences  None       Education Topics:  AED/CPR: - Group verbal and written instruction with the use of models to demonstrate the basic use of the AED with the basic ABC's of resuscitation.   Cardiac Rehab from 05/01/2017 in [facility] Cardiac and Pulmonary Rehab  Date  04/22/17  Educator  SB  Instruction Review Code  1- Verbalizes Understanding      General Nutrition Guidelines/Fats and Fiber: -Group instruction provided by verbal, written material, models and posters to present the general guidelines for heart healthy nutrition. Gives an explanation and review of dietary fats and fiber.   Cardiac Rehab from 05/01/2017 in [facility] Cardiac and Pulmonary Rehab  Date  04/08/17  Educator  CR  Instruction Review Code  1- Verbalizes Understanding      Controlling Sodium/Reading Food Labels: -Group verbal and written material supporting the discussion of sodium use in heart healthy nutrition. Review and explanation with models, verbal and written materials for utilization of the food label.   Cardiac Rehab from 05/01/2017 in [facility] Cardiac and Pulmonary Rehab  Date  04/15/17  Educator  CR  Instruction Review Code  1- Verbalizes Understanding      Exercise Physiology & General Exercise  Guidelines: - Group verbal and written instruction with models to review the exercise physiology of the cardiovascular system and associated critical values. Provides general exercise guidelines with specific guidelines to those with heart or lung disease.    Cardiac Rehab from 05/01/2017 in [facility]  Cardiac and Pulmonary Rehab  Date  05/01/17  Educator  [facility]  Instruction Review Code  1- Verbalizes Understanding      Aerobic Exercise & Resistance Training: - Gives group verbal and written instruction on the various components of exercise. Focuses on aerobic and resistive training programs and the benefits of this training and how to safely progress through these programs..   Cardiac Rehab from 05/01/2017 in [facility] Cardiac and Pulmonary Rehab  Date  03/13/17  Educator  [facility]  Instruction Review Code  1- Verbalizes Understanding      Flexibility, Balance, Mind/Body Relaxation: Provides group verbal/written instruction on the benefits of flexibility and balance training, including mind/body exercise modes such as yoga, pilates and tai chi.  Demonstration and skill practice provided.   Cardiac Rehab from 05/01/2017 in [facility] Cardiac and Pulmonary Rehab  Date  03/18/17  Educator  AS  Instruction Review Code  1- Verbalizes Understanding      Stress and Anxiety: - Provides group verbal and written instruction about the health risks of elevated stress and causes of high stress.  Discuss the correlation between heart/lung disease and anxiety and treatment options. Review healthy ways to manage with stress and anxiety.   Cardiac Rehab from 05/01/2017 in [facility] Cardiac and Pulmonary Rehab  Date  04/01/17  Educator  [facility]  Instruction Review Code  1- Verbalizes Understanding      Depression: - Provides group verbal and written instruction on the correlation between heart/lung disease and depressed mood, treatment options, and the stigmas associated with seeking treatment.   Cardiac Rehab from 05/01/2017 in [facility]  Cardiac and Pulmonary Rehab  Date  04/29/17  Educator  [facility]  Instruction Review Code  1- Verbalizes Understanding      Anatomy & Physiology of the Heart: - Group verbal and written instruction and models provide basic cardiac anatomy and physiology, with the coronary electrical and arterial systems. Review of Valvular disease and Heart Failure   Cardiac Rehab from 05/01/2017 in [facility] Cardiac and Pulmonary Rehab  Date  04/03/17  Educator  CE  Instruction Review Code  1- Verbalizes Understanding      Cardiac Procedures: - Group verbal and written instruction to review commonly prescribed medications for heart disease. Reviews the medication, class of the drug, and side effects. Includes the steps to properly store meds and maintain the prescription regimen. (beta blockers and nitrates)   Cardiac Rehab from 05/01/2017 in [facility] Cardiac and Pulmonary Rehab  Date  04/17/17  Educator  CE  Instruction Review Code  1- Verbalizes Understanding      Cardiac Medications I: - Group verbal and written instruction to review commonly prescribed medications for heart disease. Reviews the medication, class of the drug, and side effects. Includes the steps to properly store meds and maintain the prescription regimen.   Cardiac Medications II: -Group verbal and written instruction to review commonly prescribed medications for heart disease. Reviews the medication, class of the drug, and side effects. (all other drug classes)   Cardiac Rehab from 05/01/2017 in [facility] Cardiac and Pulmonary Rehab  Date  03/20/17 [facility] Factors]  Educator  [facility]  Instruction Review Code  1- Verbalizes Understanding       Go Sex-Intimacy & Heart Disease, Get SMART - Goal Setting: - Group verbal and written instruction through game format to discuss heart disease and the return to sexual intimacy. Provides group verbal and written material to discuss and apply goal setting through the application of the S.M.A.R.T. Method.    Cardiac  Rehab from 05/01/2017 in [facility] Cardiac and Pulmonary Rehab  Date  04/17/17  Educator  CE  Instruction Review Code  1- Verbalizes Understanding      Other Matters of the Heart: - Provides group verbal, written materials and models to describe Stable Angina and Peripheral Artery. Includes description of the disease process and treatment options available to the cardiac patient.   Cardiac Rehab from 05/01/2017 in [facility] Cardiac and Pulmonary Rehab  Date  04/03/17  Educator  CE  Instruction Review Code  1- Verbalizes Understanding      Exercise & Equipment Safety: - Individual verbal instruction and demonstration of equipment use and safety with use of the equipment.   Cardiac Rehab from 05/01/2017 in [facility] Cardiac and Pulmonary Rehab  Date  02/20/17  Educator  [facility]  Instruction Review Code  1- Verbalizes Understanding      Infection Prevention: - Provides verbal and written material to individual with discussion of infection control including proper hand washing and proper equipment cleaning during exercise session.   Cardiac Rehab from 05/01/2017 in [facility] Cardiac and Pulmonary Rehab  Date  02/20/17  Educator  [facility]  Instruction Review Code  1- Verbalizes Understanding      Falls Prevention: - Provides verbal and written material to individual with discussion of falls prevention and safety.   Cardiac Rehab from 05/01/2017 in [facility] Cardiac and Pulmonary Rehab  Date  02/20/17  Educator  [facility]  Instruction Review Code  1- Verbalizes Understanding      Diabetes: - Individual verbal and written instruction to review signs/symptoms of diabetes, desired ranges of glucose level fasting, after meals and with exercise. Acknowledge that pre and post exercise glucose checks will be done for 3 sessions at entry of program.   Know Your Numbers and Risk Factors: -Group verbal and written instruction about important numbers in your health.  Discussion of what are risk factors and how they play a  role in the disease process.  Review of Cholesterol, Blood Pressure, Diabetes, and BMI and the role they play in your overall health.   Cardiac Rehab from 05/01/2017 in [facility] Cardiac and Pulmonary Rehab  Date  03/20/17 [facility] Factors]  Educator  [facility]  Instruction Review Code  1- Verbalizes Understanding      Sleep Hygiene: -Provides group verbal and written instruction about how sleep can affect your health.  Define sleep hygiene, discuss sleep cycles and impact of sleep habits. Review good sleep hygiene tips.    Cardiac Rehab from 05/01/2017 in [facility] Cardiac and Pulmonary Rehab  Date  04/10/17  Educator  [facility]  Instruction Review Code  1- Verbalizes Understanding      Other: -Provides group and verbal instruction on various topics (see comments)   Knowledge Questionnaire Score: Knowledge Questionnaire Score - 02/20/17 1433      Knowledge Questionnaire Score   Pre Score  20/28 Correct responses reviewed with Inez Catalina. She verbalized understanding of the responses and had no questions today.       Core Components/Risk Factors/Patient Goals at Admission: Personal Goals and Risk Factors at Admission - 02/20/17 1426      Core Components/Risk Factors/Patient Goals on Admission   Hypertension  Yes    Intervention  Provide education on lifestyle modifcations including regular physical activity/exercise, weight management, moderate sodium restriction and increased consumption of fresh fruit, vegetables, and low fat dairy, alcohol moderation, and smoking cessation.;Monitor prescription use compliance.    Expected Outcomes  Short Term: Continued assessment and intervention until BP is <  140/66m HG in hypertensive participants. < 130/868mHG in hypertensive participants with diabetes, heart failure or chronic kidney disease.;Long Term: Maintenance of blood pressure at goal levels.    Lipids  Yes    Intervention  Provide education and support for participant on nutrition & aerobic/resistive exercise  along with prescribed medications to achieve LDL '70mg'$ , HDL >'40mg'$ .    Expected Outcomes  Short Term: Participant states understanding of desired cholesterol values and is compliant with medications prescribed. Participant is following exercise prescription and nutrition guidelines.;Long Term: Cholesterol controlled with medications as prescribed, with individualized exercise RX and with personalized nutrition plan. Value goals: LDL < '70mg'$ , HDL > 40 mg.       Core Components/Risk Factors/Patient Goals Review:  Goals and Risk Factor Review    Row Name 04/01/17 0826 04/24/17 1057           Core Components/Risk Factors/Patient Goals Review   Personal Goals Review  Weight Management/Obesity;Hypertension;Lipids  Weight Management/Obesity;Hypertension;Lipids      Review  BeMarionaas been doing well in rehab.  Her weight has been pretty steady.  She saw doctor yesterday and blood pressure was slightly elevated at 130/60.  Her pressures have been good here in class.  She has the ability to to check it at home. She hasn't been checking it t home as she is afraid it would be too high.  We talked about the importance of tracking her blood pressure daily to keep record for doctor.  She is doing well on her medications.  BeKennishaontinues to do wel in rehab.  Her weight was down 3lbs today!!  She was pleased but doesn't want to lose too much.  Her blood pressures have been good and she checks them at least 3-4x a week at home.  She has been doing well on her mediations.       Expected Outcomes  Short: Start checking blood pressures regularly.  Long: Continue to work on blood pressure management.  Short: Continue to check blood pressure regularly.  Long: Continue to work on risk factor modifications.         Core Components/Risk Factors/Patient Goals at Discharge (Final Review):  Goals and Risk Factor Review - 04/24/17 1057      Core Components/Risk Factors/Patient Goals Review   Personal Goals Review  Weight  Management/Obesity;Hypertension;Lipids    Review  BeAmaalontinues to do wel in rehab.  Her weight was down 3lbs today!!  She was pleased but doesn't want to lose too much.  Her blood pressures have been good and she checks them at least 3-4x a week at home.  She has been doing well on her mediations.     Expected Outcomes  Short: Continue to check blood pressure regularly.  Long: Continue to work on risk factor modifications.       ITP Comments: ITP Comments    Row Name 02/20/17 1417 03/12/17 0650 04/01/17 0821 04/09/17 0612 05/07/17 0619   ITP Comments  Medical review completed today. ITP sent to medical director for review, changes as needed and signature.  [facility]  30 day review. Continue with ITP unless directed changes per Medical Director review.   BeSharmaneas been out after cataract surgery.  She was given a one week resistriction so she is back today.   30 Day review. Continue with ITP unless directed changes per Medical Director review.   30 day review. Continue with ITP unless directed changes  per Medical Director review.      Comments:

## 2017-05-08 ENCOUNTER — Encounter: Payer: Medicare Other | Admitting: *Deleted

## 2017-05-08 DIAGNOSIS — Z923 Personal history of irradiation: Secondary | ICD-10-CM | POA: Diagnosis not present

## 2017-05-08 DIAGNOSIS — Z7902 Long term (current) use of antithrombotics/antiplatelets: Secondary | ICD-10-CM | POA: Diagnosis not present

## 2017-05-08 DIAGNOSIS — K219 Gastro-esophageal reflux disease without esophagitis: Secondary | ICD-10-CM | POA: Diagnosis not present

## 2017-05-08 DIAGNOSIS — I252 Old myocardial infarction: Secondary | ICD-10-CM | POA: Diagnosis not present

## 2017-05-08 DIAGNOSIS — Z79899 Other long term (current) drug therapy: Secondary | ICD-10-CM | POA: Diagnosis not present

## 2017-05-08 DIAGNOSIS — Z7982 Long term (current) use of aspirin: Secondary | ICD-10-CM | POA: Diagnosis not present

## 2017-05-08 DIAGNOSIS — I214 Non-ST elevation (NSTEMI) myocardial infarction: Secondary | ICD-10-CM

## 2017-05-08 NOTE — Telephone Encounter (Signed)
Options include take a break as she did and retry lipitor and see if it happens again Or try another medications, try crestor 10 mg daily for 3 months, will slowly increase dose at later date

## 2017-05-08 NOTE — Progress Notes (Signed)
Daily Session Note  Patient Details  Name: Jenna Young MRN: [id number] Date of Birth: [birth year] Referring Provider:     Cardiac Rehab from 02/20/2017 in [facility] Cardiac and Pulmonary Rehab  Referring Provider  End      Encounter Date: 05/08/2017  Check In: Session Check In - 05/08/17 0814      Check-In   Location  [facility]-Cardiac & Pulmonary Rehab    Staff Present  Alberteen Sam, MA, RCEP, CCRP, Exercise Physiologist;Amanda Oletta Darter, BA, ACSM CEP, Exercise Physiologist;Carroll Enterkin, RN, BSN    Supervising physician immediately available to respond to emergencies  See telemetry face sheet for immediately available ER MD    Medication changes reported      No    Fall or balance concerns reported     No    Warm-up and Cool-down  Performed on first and last piece of equipment    Resistance Training Performed  Yes    VAD Patient?  No      Pain Assessment   Currently in Pain?  No/denies          Social History   Tobacco Use  Smoking Status Former Smoker  . Last attempt to quit: 03/11/1968  . Years since quitting: 49.1  Smokeless Tobacco Never Used    Goals Met:  Independence with exercise equipment Exercise tolerated well No report of cardiac concerns or symptoms Strength training completed today  Goals Unmet:  Not Applicable  Comments: Pt able to follow exercise prescription today without complaint.  Will continue to monitor for progression.    Dr. Emily Filbert is Medical Director for [place] and [facility].

## 2017-05-09 NOTE — Telephone Encounter (Signed)
I spoke with the patient regarding Dr. Donivan Scull recommendations.  She is agreeable with retrying the lipitor- she has an appointment to see Dr. Rockey Situ on 05/19/17. I advised her if she restarts lipitor and her symptoms re-occur, then she may stop the lipitor again without calling the office and Dr. Rockey Situ can discuss further with her when she sees him on 05/19/17. The patient verbalizes understanding.

## 2017-05-13 ENCOUNTER — Encounter: Payer: Medicare Other | Attending: Internal Medicine | Admitting: *Deleted

## 2017-05-13 DIAGNOSIS — Z7982 Long term (current) use of aspirin: Secondary | ICD-10-CM | POA: Diagnosis not present

## 2017-05-13 DIAGNOSIS — Z79899 Other long term (current) drug therapy: Secondary | ICD-10-CM | POA: Diagnosis not present

## 2017-05-13 DIAGNOSIS — E785 Hyperlipidemia, unspecified: Secondary | ICD-10-CM | POA: Insufficient documentation

## 2017-05-13 DIAGNOSIS — Z923 Personal history of irradiation: Secondary | ICD-10-CM | POA: Diagnosis not present

## 2017-05-13 DIAGNOSIS — I10 Essential (primary) hypertension: Secondary | ICD-10-CM | POA: Insufficient documentation

## 2017-05-13 DIAGNOSIS — K219 Gastro-esophageal reflux disease without esophagitis: Secondary | ICD-10-CM | POA: Insufficient documentation

## 2017-05-13 DIAGNOSIS — Z87891 Personal history of nicotine dependence: Secondary | ICD-10-CM | POA: Insufficient documentation

## 2017-05-13 DIAGNOSIS — M810 Age-related osteoporosis without current pathological fracture: Secondary | ICD-10-CM | POA: Insufficient documentation

## 2017-05-13 DIAGNOSIS — I214 Non-ST elevation (NSTEMI) myocardial infarction: Secondary | ICD-10-CM

## 2017-05-13 DIAGNOSIS — Z7902 Long term (current) use of antithrombotics/antiplatelets: Secondary | ICD-10-CM | POA: Insufficient documentation

## 2017-05-13 DIAGNOSIS — I252 Old myocardial infarction: Secondary | ICD-10-CM | POA: Diagnosis not present

## 2017-05-13 NOTE — Progress Notes (Signed)
Daily Session Note  Patient Details  Name: NAELLE DIEGEL MRN: [id number] Date of Birth: 1931-06-11 Referring Provider:     Cardiac Rehab from 02/20/2017 in [facility] Cardiac and Pulmonary Rehab  Referring Provider  End      Encounter Date: 05/13/2017  Check In: Session Check In - 05/13/17 0905      Check-In   Location  [facility]-Cardiac & Pulmonary Rehab    Staff Present  Alberteen Sam, MA, RCEP, CCRP, Exercise Physiologist;Susanne Bice, RN, BSN, Lance Sell, BA, ACSM CEP, Exercise Physiologist    Supervising physician immediately available to respond to emergencies  See telemetry face sheet for immediately available ER MD    Medication changes reported      No    Fall or balance concerns reported     No    Warm-up and Cool-down  Performed on first and last piece of equipment    Resistance Training Performed  Yes    VAD Patient?  No      Pain Assessment   Currently in Pain?  No/denies    Multiple Pain Sites  No          Social History   Tobacco Use  Smoking Status Former Smoker  . Last attempt to quit: 03/11/1968  . Years since quitting: 49.2  Smokeless Tobacco Never Used    Goals Met:  Independence with exercise equipment Exercise tolerated well No report of cardiac concerns or symptoms Strength training completed today  Goals Unmet:  Not Applicable  Comments: Pt able to follow exercise prescription today without complaint.  Will continue to monitor for progression.    Dr. Emily Filbert is Medical Director for [place] and [facility].

## 2017-05-13 NOTE — Patient Instructions (Signed)
Discharge Patient Instructions  Patient Details  Name: Jenna Young MRN: [id number] Date of Birth: 11/13/1931 Referring Provider:  Nelva Bush, MD   Number of Visits: 36/36  Reason for Discharge:  Patient reached a stable level of exercise. Patient independent in their exercise. Patient has met program and personal goals.  Smoking History:  Social History   Tobacco Use  Smoking Status Former Smoker  . Last attempt to quit: 03/11/1968  . Years since quitting: 49.2  Smokeless Tobacco Never Used    Diagnosis:  NSTEMI (non-ST elevated myocardial infarction) [facility])  Initial Exercise Prescription: Initial Exercise Prescription - 02/20/17 1400      Date of Initial Exercise RX and Referring Provider   Date  02/20/17    Referring Provider  End      Treadmill   MPH  1    Grade  0    Minutes  15    METs  1.77      NuStep   Level  1    SPM  80    Minutes  15    METs  1.5      Biostep-RELP   Level  1    SPM  50    Minutes  15    METs  1.5      Prescription Details   Frequency (times per week)  3    Duration  Progress to 45 minutes of aerobic exercise without signs/symptoms of physical distress      Intensity   THRR 40-80% of Max Heartrate  102-125    Ratings of Perceived Exertion  11-13    Perceived Dyspnea  0-4      Resistance Training   Training Prescription  Yes    Weight  2 lb    Reps  10-15       Discharge Exercise Prescription (Final Exercise Prescription Changes): Exercise Prescription Changes - 04/29/17 1500      Response to Exercise   Blood Pressure (Admit)  146/74    Blood Pressure (Exercise)  142/68    Blood Pressure (Exit)  126/60    Heart Rate (Admit)  99 bpm    Heart Rate (Exercise)  108 bpm    Heart Rate (Exit)  97 bpm    Rating of Perceived Exertion (Exercise)  13    Symptoms  none    Duration  Continue with 30 min of aerobic exercise without signs/symptoms of physical distress.    Intensity  THRR unchanged      Progression   Progression  Continue to progress workloads to maintain intensity without signs/symptoms of physical distress.    Average METs  2.15      Resistance Training   Training Prescription  Yes    Weight  3 lbs    Reps  10-15      Interval Training   Interval Training  No      Treadmill   MPH  1.5    Grade  0.5    Minutes  15    METs  2.25      NuStep   Level  2    Minutes  15    METs  2.1      Biostep-RELP   Level  4    Minutes  15    METs  2      Home Exercise Plan   Plans to continue exercise at  [employer] (comment) walking and [employer]    Frequency  Add 2 additional days  to program exercise sessions.    Initial Home Exercises Provided  04/01/17       Functional Capacity: 6 Minute Walk    Row Name 02/20/17 1445 05/06/17 0927       6 Minute Walk   Phase  -  Discharge    Distance  780 feet  1142 feet    Distance % Change  -  46 %    Distance Feet Change  -  362 ft    Walk Time  6 minutes  6 minutes    # of Rest Breaks  0  0    MPH  1.5  -    METS  1.5  -    RPE  11  14    Perceived Dyspnea   0  1    VO2 Peak  5.2  8.98    Symptoms  No  No    Resting HR  79 bpm  80 bpm    Resting BP  136/58  154/60    Resting Oxygen Saturation   8 %  97 %    Exercise Oxygen Saturation  during 6 min walk  100 %  96 %    Max Ex. HR  102 bpm  118 bpm    Max Ex. BP  148/50  180/64    2 Minute Post BP  130/52  -       Quality of Life: Quality of Life - 05/13/17 0917      Quality of Life Scores   Health/Function Pre  28.5 %    Health/Function Post  26.89 %    Health/Function % Change  -5.65 %    Socioeconomic Pre  30 %    Socioeconomic Post  30 %    Socioeconomic % Change   0 %    Psych/Spiritual Pre  29.14 %    Psych/Spiritual Post  26.57 %    Psych/Spiritual % Change  -8.82 %    Family Pre  30 %    Family Post  30 %    Family % Change  0 %    GLOBAL Pre  29.17 %    GLOBAL Post  27.82 %    GLOBAL % Change  -4.63 %       Personal Goals: Goals  established at orientation with interventions provided to work toward goal. Personal Goals and Risk Factors at Admission - 02/20/17 1426      Core Components/Risk Factors/Patient Goals on Admission   Hypertension  Yes    Intervention  Provide education on lifestyle modifcations including regular physical activity/exercise, weight management, moderate sodium restriction and increased consumption of fresh fruit, vegetables, and low fat dairy, alcohol moderation, and smoking cessation.;Monitor prescription use compliance.    Expected Outcomes  Short Term: Continued assessment and intervention until BP is < 140/13m HG in hypertensive participants. < 130/865mHG in hypertensive participants with diabetes, heart failure or chronic kidney disease.;Long Term: Maintenance of blood pressure at goal levels.    Lipids  Yes    Intervention  Provide education and support for participant on nutrition & aerobic/resistive exercise along with prescribed medications to achieve LDL '70mg'$ , HDL >'40mg'$ .    Expected Outcomes  Short Term: Participant states understanding of desired cholesterol values and is compliant with medications prescribed. Participant is following exercise prescription and nutrition guidelines.;Long Term: Cholesterol controlled with medications as prescribed, with individualized exercise RX and with personalized nutrition plan. Value goals: LDL < '70mg'$ , HDL > 40 mg.  Personal Goals Discharge: Goals and Risk Factor Review - 04/24/17 1057      Core Components/Risk Factors/Patient Goals Review   Personal Goals Review  Weight Management/Obesity;Hypertension;Lipids    Review  Jenna Young continues to do wel in rehab.  Her weight was down 3lbs today!!  She was pleased but doesn't want to lose too much.  Her blood pressures have been good and she checks them at least 3-4x a week at home.  She has been doing well on her mediations.     Expected Outcomes  Short: Continue to check blood pressure regularly.   Long: Continue to work on risk factor modifications.       Exercise Goals and Review: Exercise Goals    Row Name 02/20/17 1444             Exercise Goals   Increase Physical Activity  Yes       Intervention  Provide advice, education, support and counseling about physical activity/exercise needs.;Develop an individualized exercise prescription for aerobic and resistive training based on initial evaluation findings, risk stratification, comorbidities and participant's personal goals.       Expected Outcomes  Achievement of increased cardiorespiratory fitness and enhanced flexibility, muscular endurance and strength shown through measurements of functional capacity and personal statement of participant.       Increase Strength and Stamina  Yes       Intervention  Provide advice, education, support and counseling about physical activity/exercise needs.;Develop an individualized exercise prescription for aerobic and resistive training based on initial evaluation findings, risk stratification, comorbidities and participant's personal goals.       Expected Outcomes  Achievement of increased cardiorespiratory fitness and enhanced flexibility, muscular endurance and strength shown through measurements of functional capacity and personal statement of participant.       Able to understand and use rate of perceived exertion (RPE) scale  Yes       Intervention  Provide education and explanation on how to use RPE scale       Expected Outcomes  Short Term: Able to use RPE daily in rehab to express subjective intensity level;Long Term:  Able to use RPE to guide intensity level when exercising independently       Able to understand and use Dyspnea scale  Yes       Intervention  Provide education and explanation on how to use Dyspnea scale       Expected Outcomes  Short Term: Able to use Dyspnea scale daily in rehab to express subjective sense of shortness of breath during exertion;Long Term: Able to use  Dyspnea scale to guide intensity level when exercising independently       Knowledge and understanding of Target Heart Rate Range (THRR)  Yes       Intervention  Provide education and explanation of THRR including how the numbers were predicted and where they are located for reference       Expected Outcomes  Short Term: Able to state/look up THRR;Long Term: Able to use THRR to govern intensity when exercising independently;Short Term: Able to use daily as guideline for intensity in rehab       Able to check pulse independently  Yes       Intervention  Provide education and demonstration on how to check pulse in carotid and radial arteries.;Review the importance of being able to check your own pulse for safety during independent exercise       Expected Outcomes  Short Term: Able to explain why  pulse checking is important during independent exercise;Long Term: Able to check pulse independently and accurately       Understanding of Exercise Prescription  Yes       Intervention  Provide education, explanation, and written materials on patient's individual exercise prescription       Expected Outcomes  Short Term: Able to explain program exercise prescription;Long Term: Able to explain home exercise prescription to exercise independently          Nutrition & Weight - Outcomes: Pre Biometrics - 02/20/17 1444      Pre Biometrics   Height  5' 2.5" (1.588 m)    Weight  138 lb 1.6 oz (62.6 kg)    Waist Circumference  32.5 inches    Hip Circumference  39 inches    Waist to Hip Ratio  0.83 %    BMI (Calculated)  24.84    Single Leg Stand  2.45 seconds      Post Biometrics - 05/06/17 0927       Post  Biometrics   Height  5' 2.5" (1.588 m)    Weight  138 lb (62.6 kg)    Waist Circumference  32.5 inches    Hip Circumference  39 inches    Waist to Hip Ratio  0.83 %    BMI (Calculated)  24.82    Single Leg Stand  6.5 seconds       Nutrition: Nutrition Therapy & Goals - 05/08/17 0903       Nutrition Therapy   Diet  DASH/ TLC    Protein (specify units)  6-7oz    Fiber  25 grams    Whole Grain Foods  2 servings    Saturated Fats  11 max. grams    Fruits and Vegetables  4 servings/day    Sodium  2000 grams      Personal Nutrition Goals   Nutrition Goal  Re-incorporate eggs into your diet    Personal Goal #2  Try to include more fruits each week      Intervention Plan   Intervention  Prescribe, educate and counsel regarding individualized specific dietary modifications aiming towards targeted core components such as weight, hypertension, lipid management, diabetes, heart failure and other comorbidities.;Nutrition handout(s) given to patient. list of whole food sources from various food groups    Expected Outcomes  Short Term Goal: Understand basic principles of dietary content, such as calories, fat, sodium, cholesterol and nutrients.;Short Term Goal: A plan has been developed with personal nutrition goals set during dietitian appointment.;Long Term Goal: Adherence to prescribed nutrition plan.       Nutrition Discharge: Nutrition Assessments - 05/13/17 0917      MEDFICTS Scores   Pre Score  24    Post Score  41    Score Difference  17       Education Questionnaire Score: Knowledge Questionnaire Score - 05/13/17 0917      Knowledge Questionnaire Score   Pre Score  20/28    Post Score  27/28       Goals reviewed with patient; copy given to patient.

## 2017-05-15 DIAGNOSIS — Z923 Personal history of irradiation: Secondary | ICD-10-CM | POA: Diagnosis not present

## 2017-05-15 DIAGNOSIS — Z7902 Long term (current) use of antithrombotics/antiplatelets: Secondary | ICD-10-CM | POA: Diagnosis not present

## 2017-05-15 DIAGNOSIS — I252 Old myocardial infarction: Secondary | ICD-10-CM | POA: Diagnosis not present

## 2017-05-15 DIAGNOSIS — I214 Non-ST elevation (NSTEMI) myocardial infarction: Secondary | ICD-10-CM

## 2017-05-15 DIAGNOSIS — K219 Gastro-esophageal reflux disease without esophagitis: Secondary | ICD-10-CM | POA: Diagnosis not present

## 2017-05-15 DIAGNOSIS — Z79899 Other long term (current) drug therapy: Secondary | ICD-10-CM | POA: Diagnosis not present

## 2017-05-15 DIAGNOSIS — Z7982 Long term (current) use of aspirin: Secondary | ICD-10-CM | POA: Diagnosis not present

## 2017-05-15 NOTE — Progress Notes (Signed)
Discharge Progress Report  Patient Details  Name: Jenna Young MRN: [id number] Date of Birth: Dec 26, 1931 Referring Provider:     Cardiac Rehab from 02/20/2017 in [facility]  Referring Provider  End       Number of Visits: 36/36  Reason for Discharge:  Patient reached a stable level of exercise. Patient independent in their exercise. Patient has met program and personal goals.  Smoking History:  Social History   Tobacco Use  Smoking Status Former Smoker  . Last attempt to quit: 03/11/1968  . Years since quitting: 49.2  Smokeless Tobacco Never Used    Diagnosis:  NSTEMI (non-ST elevated myocardial infarction) ([place])  ADL [facility]:   Initial Exercise Prescription: Initial Exercise Prescription - 02/20/17 1400      Date of Initial Exercise RX and Referring Provider   Date  02/20/17    Referring Provider  End      Treadmill   MPH  1    Grade  0    Minutes  15    METs  1.77      NuStep   Level  1    SPM  80    Minutes  15    METs  1.5      Biostep-RELP   Level  1    SPM  50    Minutes  15    METs  1.5      Prescription Details   Frequency (times per week)  3    Duration  Progress to 45 minutes of aerobic exercise without signs/symptoms of physical distress      Intensity   THRR 40-80% of Max Heartrate  102-125    Ratings of Perceived Exertion  11-13    Perceived Dyspnea  0-4      Resistance Training   Training Prescription  Yes    Weight  2 lb    Reps  10-15       Discharge Exercise Prescription (Final Exercise Prescription Changes): Exercise Prescription Changes - 04/29/17 1500      Response to Exercise   Blood Pressure (Admit)  146/74    Blood Pressure (Exercise)  142/68    Blood Pressure (Exit)  126/60    Heart Rate (Admit)  99 bpm    Heart Rate (Exercise)  108 bpm    Heart Rate (Exit)  97 bpm    Rating of Perceived Exertion (Exercise)  13    Symptoms  none    Duration  Continue with 30 min of aerobic exercise  without signs/symptoms of physical distress.    Intensity  THRR unchanged      Progression   Progression  Continue to progress workloads to maintain intensity without signs/symptoms of physical distress.    Average METs  2.15      Resistance Training   Training Prescription  Yes    Weight  3 lbs    Reps  10-15      Interval Training   Interval Training  No      Treadmill   MPH  1.5    Grade  0.5    Minutes  15    METs  2.25      NuStep   Level  2    Minutes  15    METs  2.1      Biostep-RELP   Level  4    Minutes  15    METs  2      Home Exercise Plan  Plans to continue exercise at  [facility] (comment) walking and [employer]    Frequency  Add 2 additional days to program exercise sessions.    Initial Home Exercises Provided  04/01/17       Functional Capacity: 6 Minute Walk    Row Name 02/20/17 1445 05/06/17 0927       6 Minute Walk   Phase  -  Discharge    Distance  780 feet  1142 feet    Distance % Change  -  46 %    Distance Feet Change  -  362 ft    Walk Time  6 minutes  6 minutes    # of Rest Breaks  0  0    MPH  1.5  -    METS  1.5  -    RPE  11  14    Perceived Dyspnea   0  1    VO2 Peak  5.2  8.98    Symptoms  No  No    Resting HR  79 bpm  80 bpm    Resting BP  136/58  154/60    Resting Oxygen Saturation   8 %  97 %    Exercise Oxygen Saturation  during 6 min walk  100 %  96 %    Max Ex. HR  102 bpm  118 bpm    Max Ex. BP  148/50  180/64    2 Minute Post BP  130/52  -       Psychological, QOL, Others - Outcomes: PHQ 2/9: Depression screen [facility] 2/9 05/13/2017 03/31/2017 02/20/2017 06/28/2016 04/17/2016  Decreased Interest 0 0 0 0 0  Down, Depressed, Hopeless 0 0 0 0 0  PHQ - 2 Score 0 0 0 0 0  Altered sleeping 0 0 0 - -  Tired, decreased energy 0 0 1 - -  Change in appetite 0 0 0 - -  Feeling bad or failure about yourself  0 0 0 - -  Trouble concentrating 0 0 0 - -  Moving slowly or fidgety/restless 0 0 0 - -  Suicidal  thoughts 0 0 0 - -  PHQ-9 Score 0 0 1 - -  Difficult doing work/chores Not difficult at all Not difficult at all Not difficult at all - -  Some recent data might be hidden    Quality of Life: Quality of Life - 05/13/17 0917      Quality of Life Scores   Health/Function Pre  28.5 %    Health/Function Post  26.89 %    Health/Function % Change  -5.65 %    Socioeconomic Pre  30 %    Socioeconomic Post  30 %    Socioeconomic % Change   0 %    Psych/Spiritual Pre  29.14 %    Psych/Spiritual Post  26.57 %    Psych/Spiritual % Change  -8.82 %    Family Pre  30 %    Family Post  30 %    Family % Change  0 %    GLOBAL Pre  29.17 %    GLOBAL Post  27.82 %    GLOBAL % Change  -4.63 %       Personal Goals: Goals established at orientation with interventions provided to work toward goal. Personal Goals and Risk Factors at Admission - 02/20/17 1426      Core Components/Risk Factors/Patient Goals on Admission   Hypertension  Yes  Intervention  Provide education on lifestyle modifcations including regular physical activity/exercise, weight management, moderate sodium restriction and increased consumption of fresh fruit, vegetables, and low fat dairy, alcohol moderation, and smoking cessation.;Monitor prescription use compliance.    Expected Outcomes  Short Term: Continued assessment and intervention until BP is < 140/80m HG in hypertensive participants. < 130/822mHG in hypertensive participants with diabetes, heart failure or chronic kidney disease.;Long Term: Maintenance of blood pressure at goal levels.    Lipids  Yes    Intervention  Provide education and support for participant on nutrition & aerobic/resistive exercise along with prescribed medications to achieve LDL <7068mHDL >24m72m  Expected Outcomes  Short Term: Participant states understanding of desired cholesterol values and is compliant with medications prescribed. Participant is following exercise prescription and nutrition  guidelines.;Long Term: Cholesterol controlled with medications as prescribed, with individualized exercise RX and with personalized nutrition plan. Value goals: LDL < 70mg20mL > 40 mg.        Personal Goals Discharge: Goals and Risk Factor Review    Row Name 04/01/17 0826 04/24/17 1057           Core Components/Risk Factors/Patient Goals Review   Personal Goals Review  Weight Management/Obesity;Hypertension;Lipids  Weight Management/Obesity;Hypertension;Lipids      Review  BettyBrontebeen doing well in rehab.  Her weight has been pretty steady.  She saw doctor yesterday and blood pressure was slightly elevated at 130/60.  Her pressures have been good here in class.  She has the ability to to check it at home. She hasn't been checking it t home as she is afraid it would be too high.  We talked about the importance of tracking her blood pressure daily to keep record for doctor.  She is doing well on her medications.  BettyAddyinues to do wel in rehab.  Her weight was down 3lbs today!!  She was pleased but doesn't want to lose too much.  Her blood pressures have been good and she checks them at least 3-4x a week at home.  She has been doing well on her mediations.       Expected Outcomes  Short: Start checking blood pressures regularly.  Long: Continue to work on blood pressure management.  Short: Continue to check blood pressure regularly.  Long: Continue to work on risk factor modifications.         Exercise Goals and Review: Exercise Goals    Row Name 02/20/17 1444             Exercise Goals   Increase Physical Activity  Yes       Intervention  Provide advice, education, support and counseling about physical activity/exercise needs.;Develop an individualized exercise prescription for aerobic and resistive training based on initial evaluation findings, risk stratification, comorbidities and participant's personal goals.       Expected Outcomes  Achievement of increased cardiorespiratory  fitness and enhanced flexibility, muscular endurance and strength shown through measurements of functional capacity and personal statement of participant.       Increase Strength and Stamina  Yes       Intervention  Provide advice, education, support and counseling about physical activity/exercise needs.;Develop an individualized exercise prescription for aerobic and resistive training based on initial evaluation findings, risk stratification, comorbidities and participant's personal goals.       Expected Outcomes  Achievement of increased cardiorespiratory fitness and enhanced flexibility, muscular endurance and strength shown through measurements of functional capacity and personal statement of  participant.       Able to understand and use rate of perceived exertion (RPE) scale  Yes       Intervention  Provide education and explanation on how to use RPE scale       Expected Outcomes  Short Term: Able to use RPE daily in rehab to express subjective intensity level;Long Term:  Able to use RPE to guide intensity level when exercising independently       Able to understand and use Dyspnea scale  Yes       Intervention  Provide education and explanation on how to use Dyspnea scale       Expected Outcomes  Short Term: Able to use Dyspnea scale daily in rehab to express subjective sense of shortness of breath during exertion;Long Term: Able to use Dyspnea scale to guide intensity level when exercising independently       Knowledge and understanding of Target Heart Rate Range (THRR)  Yes       Intervention  Provide education and explanation of THRR including how the numbers were predicted and where they are located for reference       Expected Outcomes  Short Term: Able to state/look up THRR;Long Term: Able to use THRR to govern intensity when exercising independently;Short Term: Able to use daily as guideline for intensity in rehab       Able to check pulse independently  Yes       Intervention  Provide  education and demonstration on how to check pulse in carotid and radial arteries.;Review the importance of being able to check your own pulse for safety during independent exercise       Expected Outcomes  Short Term: Able to explain why pulse checking is important during independent exercise;Long Term: Able to check pulse independently and accurately       Understanding of Exercise Prescription  Yes       Intervention  Provide education, explanation, and written materials on patient's individual exercise prescription       Expected Outcomes  Short Term: Able to explain program exercise prescription;Long Term: Able to explain home exercise prescription to exercise independently          Nutrition & Weight - Outcomes: Pre Biometrics - 02/20/17 1444      Pre Biometrics   Height  5' 2.5" (1.588 m)    Weight  138 lb 1.6 oz (62.6 kg)    Waist Circumference  32.5 inches    Hip Circumference  39 inches    Waist to Hip Ratio  0.83 %    BMI (Calculated)  24.84    Single Leg Stand  2.45 seconds      Post Biometrics - 05/06/17 0927       Post  Biometrics   Height  5' 2.5" (1.588 m)    Weight  138 lb (62.6 kg)    Waist Circumference  32.5 inches    Hip Circumference  39 inches    Waist to Hip Ratio  0.83 %    BMI (Calculated)  24.82    Single Leg Stand  6.5 seconds       Nutrition: Nutrition Therapy & Goals - 05/08/17 0903      Nutrition Therapy   Diet  DASH/ TLC    Protein (specify units)  6-7oz    Fiber  25 grams    Whole Grain Foods  2 servings    Saturated Fats  11 max. grams    Fruits and  Vegetables  4 servings/day    Sodium  2000 grams      Personal Nutrition Goals   Nutrition Goal  Re-incorporate eggs into your diet    Personal Goal #2  Try to include more fruits each week      Intervention Plan   Intervention  Prescribe, educate and counsel regarding individualized specific dietary modifications aiming towards targeted core components such as weight, hypertension, lipid  management, diabetes, heart failure and other comorbidities.;Nutrition handout(s) given to patient. list of whole food sources from various food groups    Expected Outcomes  Short Term Goal: Understand basic principles of dietary content, such as calories, fat, sodium, cholesterol and nutrients.;Short Term Goal: A plan has been developed with personal nutrition goals set during dietitian appointment.;Long Term Goal: Adherence to prescribed nutrition plan.       Nutrition Discharge: Nutrition Assessments - 05/13/17 0917      MEDFICTS Scores   Pre Score  24    Post Score  41    Score Difference  17       Education Questionnaire Score: Knowledge Questionnaire Score - 05/13/17 0917      Knowledge Questionnaire Score   Pre Score  20/28    Post Score  27/28       Goals reviewed with patient; copy given to patient.

## 2017-05-15 NOTE — Progress Notes (Signed)
Cardiac Individual Treatment Plan  Patient Details  Name: Jenna Young MRN: [id number] Date of Birth: 04-Feb-1932 Referring Provider:     Cardiac Rehab from 02/20/2017 in [facility] Cardiac and Pulmonary Rehab  Referring Provider  End      Initial Encounter Date:    Cardiac Rehab from 02/20/2017 in [facility] Cardiac and Pulmonary Rehab  Date  02/20/17  Referring Provider  End      Visit Diagnosis: NSTEMI (non-ST elevated myocardial infarction) [facility])  Patient's Home Medications on Admission:  Current Outpatient Medications:  .  aspirin (ASPIRIN EC) 81 MG EC tablet, Take 81 mg by mouth daily. Swallow whole. , Disp: , Rfl:  .  Cholecalciferol (VITAMIN D) 2000 UNITS CAPS, Take 1 capsule by mouth daily.  , Disp: , Rfl:  .  clopidogrel (PLAVIX) 75 MG tablet, TAKE ONE TABLET BY MOUTH DAILY WITH BREAKFAST, Disp: 30 tablet, Rfl: 6 .  cyanocobalamin (,VITAMIN B-12,) 1000 MCG/ML injection, Inject 1,000 mcg into the muscle every 3 (three) months., Disp: , Rfl:  .  isosorbide mononitrate (IMDUR) 30 MG 24 hr tablet, Take 0.5 tablets (15 mg total) by mouth daily., Disp: 15 tablet, Rfl: 0 .  nitroGLYCERIN (NITROSTAT) 0.4 MG SL tablet, Place 1 tablet (0.4 mg total) under the tongue every 5 (five) minutes x 3 doses as needed for chest pain., Disp: 30 tablet, Rfl: 2 .  omeprazole (PRILOSEC) 20 MG capsule, Take 1 capsule (20 mg total) by mouth daily. (Patient taking differently: Take 20 mg by mouth daily as needed (acid reflux). ), Disp: 30 capsule, Rfl: 5  Past Medical History: Past Medical History:  Diagnosis Date  . Allergic rhinitis   . Anemia   . Anxiety   . GERD (gastroesophageal reflux disease)   . History of radiation therapy 04/29/16- 05/24/16   Left Breast 40.05 Gy in 15 fractions, Left Breast boost 10 Gy in 5 fractions.   . Hyperlipidemia   . Hypertension    treated by Dr Thurnell Garbe recently with syncope episode  . Osteopenia   . Osteoporosis     Tobacco Use: Social History   Tobacco  Use  Smoking Status Former Smoker  . Last attempt to quit: 03/11/1968  . Years since quitting: 49.2  Smokeless Tobacco Never Used    Labs: Recent Chemical engineer    Labs for ITP Cardiac and Pulmonary Rehab Latest Ref Rng & Units 02/21/2014 03/17/2015 03/21/2016 02/11/2017 03/31/2017   Cholestrol 0 - 200 mg/dL 221(H) 200 205(H) 173 125   LDLCALC 0 - 99 mg/dL 137(H) 115(H) 125(H) 100(H) 56   LDLDIRECT mg/dL - - - - -   HDL >39.00 mg/dL 48.60 51.50 56.50 53 50.50   Trlycerides 0.0 - 149.0 mg/dL 179.0(H) 167.0(H) 117.0 98 92.0   Hemoglobin A1c 4.6 - 6.1 % - - - - -   TCO2 0 - 100 mmol/L - - - - -       Exercise Target Goals:    Exercise Program Goal: Individual exercise prescription set using results from initial 6 min walk test and THRR while considering  patient's activity barriers and safety.   Exercise Prescription Goal: Initial exercise prescription builds to 30-45 minutes a day of aerobic activity, 2-3 days per week.  Home exercise guidelines will be given to patient during program as part of exercise prescription that the participant will acknowledge.  Activity Barriers & Risk Stratification: Activity Barriers & Cardiac Risk Stratification - 02/20/17 1424      Activity Barriers & Cardiac Risk Stratification  Activity Barriers  Back Problems History of compression fracture.  Exercise helps.    Cardiac Risk Stratification  High       6 Minute Walk: 6 Minute Walk    Row Name 02/20/17 1445 05/06/17 0927       6 Minute Walk   Phase  -  Discharge    Distance  780 feet  1142 feet    Distance % Change  -  46 %    Distance Feet Change  -  362 ft    Walk Time  6 minutes  6 minutes    # of Rest Breaks  0  0    MPH  1.5  -    METS  1.5  -    RPE  11  14    Perceived Dyspnea   0  1    VO2 Peak  5.2  8.98    Symptoms  No  No    Resting HR  79 bpm  80 bpm    Resting BP  136/58  154/60    Resting Oxygen Saturation   8 %  97 %    Exercise Oxygen Saturation  during 6 min  walk  100 %  96 %    Max Ex. HR  102 bpm  118 bpm    Max Ex. BP  148/50  180/64    2 Minute Post BP  130/52  -       Oxygen Initial Assessment:   Oxygen Re-Evaluation:   Oxygen Discharge (Final Oxygen Re-Evaluation):   Initial Exercise Prescription: Initial Exercise Prescription - 02/20/17 1400      Date of Initial Exercise RX and Referring Provider   Date  02/20/17    Referring Provider  End      Treadmill   MPH  1    Grade  0    Minutes  15    METs  1.77      NuStep   Level  1    SPM  80    Minutes  15    METs  1.5      Biostep-RELP   Level  1    SPM  50    Minutes  15    METs  1.5      Prescription Details   Frequency (times per week)  3    Duration  Progress to 45 minutes of aerobic exercise without signs/symptoms of physical distress      Intensity   THRR 40-80% of Max Heartrate  102-125    Ratings of Perceived Exertion  11-13    Perceived Dyspnea  0-4      Resistance Training   Training Prescription  Yes    Weight  2 lb    Reps  10-15       Perform Capillary Blood Glucose checks as needed.  Exercise Prescription Changes: Exercise Prescription Changes    Row Name 02/20/17 1400 03/18/17 1600 03/31/17 1600 04/01/17 0900 04/16/17 1500     Response to Exercise   Blood Pressure (Admit)  135/58  116/62  122/72  -  134/56   Blood Pressure (Exercise)  148/50  130/58  128/58  -  148/76   Blood Pressure (Exit)  130/52  112/62  -  -  126/64   Heart Rate (Admit)  79 bpm  98 bpm  90 bpm  -  98 bpm   Heart Rate (Exercise)  102 bpm  106 bpm  100 bpm  -  117 bpm   Heart Rate (Exit)  73 bpm  86 bpm  82 bpm  -  91 bpm   Oxygen Saturation (Admit)  98 %  -  -  -  -   Oxygen Saturation (Exit)  100 %  -  -  -  -   Rating of Perceived Exertion (Exercise)  '11  12  13  '$ -  15   Symptoms  -  none  none  -  none   Duration  -  Continue with 30 min of aerobic exercise without signs/symptoms of physical distress.  Continue with 30 min of aerobic exercise without  signs/symptoms of physical distress.  -  Continue with 30 min of aerobic exercise without signs/symptoms of physical distress.   Intensity  -  THRR unchanged  THRR unchanged  -  THRR unchanged     Progression   Progression  -  Continue to progress workloads to maintain intensity without signs/symptoms of physical distress.  Continue to progress workloads to maintain intensity without signs/symptoms of physical distress.  -  Continue to progress workloads to maintain intensity without signs/symptoms of physical distress.   Average METs  -  2.08  2.05  -  2.08     Resistance Training   Training Prescription  -  Yes  Yes  -  Yes   Weight  -  2 lb  2 lb  -  3 lbs   Reps  -  10-15  10-15  -  10-15     Interval Training   Interval Training  -  No  No  -  No     Treadmill   MPH  -  1.5  -  -  1.5   Grade  -  0  -  -  0.5   Minutes  -  15  -  -  15   METs  -  2.15  -  -  2.25     NuStep   Level  -  2  2  -  2   Minutes  -  15  15  -  15   METs  -  2.1  2.1  -  2     Biostep-RELP   Level  -  2  2  -  2   Minutes  -  15  15  -  15   METs  -  2  2  -  2     Home Exercise Plan   Plans to continue exercise at  -  -  -  [employer] (comment) walking and [employer] (comment) walking and [employer]   Frequency  -  -  -  Add 2 additional days to program exercise sessions.  Add 2 additional days to program exercise sessions.   Initial Home Exercises Provided  -  -  -  04/01/17  04/01/17   Row Name 04/29/17 1500             Response to Exercise   Blood Pressure (Admit)  146/74       Blood Pressure (Exercise)  142/68       Blood Pressure (Exit)  126/60       Heart Rate (Admit)  99 bpm       Heart Rate (Exercise)  108 bpm       Heart Rate (Exit)  97 bpm       Rating of Perceived Exertion (  Exercise)  13       Symptoms  none       Duration  Continue with 30 min of aerobic exercise without signs/symptoms of physical distress.       Intensity  THRR  unchanged         Progression   Progression  Continue to progress workloads to maintain intensity without signs/symptoms of physical distress.       Average METs  2.15         Resistance Training   Training Prescription  Yes       Weight  3 lbs       Reps  10-15         Interval Training   Interval Training  No         Treadmill   MPH  1.5       Grade  0.5       Minutes  15       METs  2.25         NuStep   Level  2       Minutes  15       METs  2.1         Biostep-RELP   Level  4       Minutes  15       METs  2         Home Exercise Plan   Plans to continue exercise at  [employer] (comment) walking and [employer]       Frequency  Add 2 additional days to program exercise sessions.       Initial Home Exercises Provided  04/01/17          Exercise Comments: Exercise Comments    Row Name 03/06/17 1203 05/15/17 1028         Exercise Comments  First full day of exercise!  Patient was oriented to gym and equipment including functions, settings, policies, and procedures.  Patient's individual exercise prescription and treatment plan were reviewed.  All starting workloads were established based on the results of the 6 minute walk test done at initial orientation visit.  The plan for exercise progression was also introduced and progression will be customized based on patient's performance and goals.   Joene graduated today from  rehab with 36 sessions completed.  Details of the patient's exercise prescription and what She needs to do in order to continue the prescription and progress were discussed with patient.  Patient was given a copy of prescription and goals.  Patient verbalized understanding.  Moriah plans to continue to exercise by walking and going back to [employer].         Exercise Goals and Review: Exercise Goals    Row Name 02/20/17 1444             Exercise Goals   Increase Physical Activity  Yes       Intervention  Provide advice,  education, support and counseling about physical activity/exercise needs.;Develop an individualized exercise prescription for aerobic and resistive training based on initial evaluation findings, risk stratification, comorbidities and participant's personal goals.       Expected Outcomes  Achievement of increased cardiorespiratory fitness and enhanced flexibility, muscular endurance and strength shown through measurements of functional capacity and personal statement of participant.       Increase Strength and Stamina  Yes       Intervention  Provide advice, education, support and counseling about physical activity/exercise  needs.;Develop an individualized exercise prescription for aerobic and resistive training based on initial evaluation findings, risk stratification, comorbidities and participant's personal goals.       Expected Outcomes  Achievement of increased cardiorespiratory fitness and enhanced flexibility, muscular endurance and strength shown through measurements of functional capacity and personal statement of participant.       Able to understand and use rate of perceived exertion (RPE) scale  Yes       Intervention  Provide education and explanation on how to use RPE scale       Expected Outcomes  Short Term: Able to use RPE daily in rehab to express subjective intensity level;Long Term:  Able to use RPE to guide intensity level when exercising independently       Able to understand and use Dyspnea scale  Yes       Intervention  Provide education and explanation on how to use Dyspnea scale       Expected Outcomes  Short Term: Able to use Dyspnea scale daily in rehab to express subjective sense of shortness of breath during exertion;Long Term: Able to use Dyspnea scale to guide intensity level when exercising independently       Knowledge and understanding of Target Heart Rate Range (THRR)  Yes       Intervention  Provide education and explanation of THRR including how the numbers were  predicted and where they are located for reference       Expected Outcomes  Short Term: Able to state/look up THRR;Long Term: Able to use THRR to govern intensity when exercising independently;Short Term: Able to use daily as guideline for intensity in rehab       Able to check pulse independently  Yes       Intervention  Provide education and demonstration on how to check pulse in carotid and radial arteries.;Review the importance of being able to check your own pulse for safety during independent exercise       Expected Outcomes  Short Term: Able to explain why pulse checking is important during independent exercise;Long Term: Able to check pulse independently and accurately       Understanding of Exercise Prescription  Yes       Intervention  Provide education, explanation, and written materials on patient's individual exercise prescription       Expected Outcomes  Short Term: Able to explain program exercise prescription;Long Term: Able to explain home exercise prescription to exercise independently          Exercise Goals Re-Evaluation : Exercise Goals Re-Evaluation    Row Name 03/06/17 1204 03/18/17 1648 03/31/17 1653 04/01/17 0821 04/16/17 1515     Exercise Goal Re-Evaluation   Exercise Goals Review  Understanding of Exercise Prescription;Knowledge and understanding of Target Heart Rate Range (THRR);Able to understand and use rate of perceived exertion (RPE) scale  Increase Strength and Stamina;Understanding of Exercise Prescription;Increase Physical Activity  Increase Strength and Stamina;Understanding of Exercise Prescription;Increase Physical Activity  Increase Physical Activity;Increase Strength and Stamina;Understanding of Exercise Prescription  Increase Physical Activity;Increase Strength and Stamina;Understanding of Exercise Prescription   Comments  Reviewed RPE scale, THR and program prescription with pt today.  Pt voiced understanding and was given a copy of goals to take home.    Jara is off to a good start in rehab.  She has completed three full days of exercise.  So far she is doing well with her workloads.  We will try to start increasing her workloads and continue to  monitor her progression.   Lanetra has only attended once since last review.  She continues to do well and upon return we will start to increase workloads some more.  We will continue to monitor her progression.   Ambriella feels that exercise is is helping her.  She hopes it helps enough to keep her off the operating table for CABG.  She has not had any of the chest pain/burning with exercise.  She has not been exercising at home.  Reviewed home exercise with pt today.  Pt plans to walk and returnto [employer] for exercise.  Reviewed THR, pulse, RPE, sign and symptoms, NTG use, and when to call 911 or MD.  Also discussed weather considerations and indoor options.  Pt voiced understanding.  Aurelia has been doing well in rehab.  She is up to 2 METs on the NuStep.  We will continue to monitor her progress.    Expected Outcomes  Short: Use RPE daily to regulate intensity.  Long: Follow program prescription in THR.  Short: Increase some workloads.  Long: Continue to work in increasing physcial activity  Short: Increase some workloads.  Long: Continue to work in Psychologist, counselling activity  Short: Return to [employer] at least two days a week.  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   Short: Continue to move up workloads.  Long: Continue to build strength and stamina.    [place] Name 04/24/17 1055 04/29/17 1537           Exercise Goal Re-Evaluation   Exercise Goals Review  Increase Physical Activity;Increase Strength and Stamina;Understanding of Exercise Prescription  Increase Physical Activity;Increase Strength and Stamina;Understanding of Exercise Prescription      Comments  Shakala continues to do well in rehab.  Seh is getting stronger and has more stamina.  She was actually able to sweep and mop the  floors yesterday!!  She has been walking some at home.   Tianah had been doing well in rehab. She is now up to 2.1 METs on the NuStep.  We will continue to work with her to increase her workloads.  We will continue to monitor her progression.       Expected Outcomes  Short: continue to try to get back to gym.  Long: Continue to build strength and stamina.   Short: Increase workload on NuStep.  Long: Continue work on Education administrator.          Discharge Exercise Prescription (Final Exercise Prescription Changes): Exercise Prescription Changes - 04/29/17 1500      Response to Exercise   Blood Pressure (Admit)  146/74    Blood Pressure (Exercise)  142/68    Blood Pressure (Exit)  126/60    Heart Rate (Admit)  99 bpm    Heart Rate (Exercise)  108 bpm    Heart Rate (Exit)  97 bpm    Rating of Perceived Exertion (Exercise)  13    Symptoms  none    Duration  Continue with 30 min of aerobic exercise without signs/symptoms of physical distress.    Intensity  THRR unchanged      Progression   Progression  Continue to progress workloads to maintain intensity without signs/symptoms of physical distress.    Average METs  2.15      Resistance Training   Training Prescription  Yes    Weight  3 lbs    Reps  10-15      Interval Training   Interval Training  No      Treadmill   MPH  1.5    Grade  0.5    Minutes  15    METs  2.25      NuStep   Level  2    Minutes  15    METs  2.1      Biostep-RELP   Level  4    Minutes  15    METs  2      Home Exercise Plan   Plans to continue exercise at  [employer] (comment) walking and [employer]    Frequency  Add 2 additional days to program exercise sessions.    Initial Home Exercises Provided  04/01/17       Nutrition:  Target Goals: Understanding of nutrition guidelines, daily intake of sodium '1500mg'$ , cholesterol '200mg'$ , calories 30% from fat and 7% or less from saturated fats, daily to have 5 or more servings  of fruits and vegetables.  Biometrics: Pre Biometrics - 02/20/17 1444      Pre Biometrics   Height  5' 2.5" (1.588 m)    Weight  138 lb 1.6 oz (62.6 kg)    Waist Circumference  32.5 inches    Hip Circumference  39 inches    Waist to Hip Ratio  0.83 %    BMI (Calculated)  24.84    Single Leg Stand  2.45 seconds      Post Biometrics - 05/06/17 0927       Post  Biometrics   Height  5' 2.5" (1.588 m)    Weight  138 lb (62.6 kg)    Waist Circumference  32.5 inches    Hip Circumference  39 inches    Waist to Hip Ratio  0.83 %    BMI (Calculated)  24.82    Single Leg Stand  6.5 seconds       Nutrition Therapy Plan and Nutrition Goals: Nutrition Therapy & Goals - 05/08/17 0903      Nutrition Therapy   Diet  DASH/ TLC    Protein (specify units)  6-7oz    Fiber  25 grams    Whole Grain Foods  2 servings    Saturated Fats  11 max. grams    Fruits and Vegetables  4 servings/day    Sodium  2000 grams      Personal Nutrition Goals   Nutrition Goal  Re-incorporate eggs into your diet    Personal Goal #2  Try to include more fruits each week      Intervention Plan   Intervention  Prescribe, educate and counsel regarding individualized specific dietary modifications aiming towards targeted core components such as weight, hypertension, lipid management, diabetes, heart failure and other comorbidities.;Nutrition handout(s) given to patient. list of whole food sources from various food groups    Expected Outcomes  Short Term Goal: Understand basic principles of dietary content, such as calories, fat, sodium, cholesterol and nutrients.;Short Term Goal: A plan has been developed with personal nutrition goals set during dietitian appointment.;Long Term Goal: Adherence to prescribed nutrition plan.       Nutrition Assessments: Nutrition Assessments - 05/13/17 0917      MEDFICTS Scores   Pre Score  24    Post Score  41    Score Difference  17       Nutrition Goals  Re-Evaluation: Nutrition Goals Re-Evaluation    Row Name 04/01/17 0831 04/24/17 1058           Goals  Current Weight  135 lb (61.2 kg)  -      Nutrition Goal  Meet with dietician  Meet with dietician      Comment  Appt scheduled for 1/31 during class.   She has been eating a lot of chicken and would like some alternatives.   Collie Siad missed her appointment during class.  It has been rescheduled for 2/28 as that is the next time that Lattie Haw will be in class.       Expected Outcome  Short: Meet with dietican.  Long: Follow recommendations set out by nutritionist  Short: Meet with [place].  Long: Follow recommendations set out by nutritionist         Nutrition Goals Discharge (Final Nutrition Goals Re-Evaluation): Nutrition Goals Re-Evaluation - 04/24/17 1058      Goals   Nutrition Goal  Meet with dietician    Comment  Collie Siad missed her appointment during class.  It has been rescheduled for 2/28 as that is the next time that Lattie Haw will be in class.     Expected Outcome  Short: Meet with dietican.  Long: Follow recommendations set out by nutritionist       Psychosocial: Target Goals: Acknowledge presence or absence of significant depression and/or stress, maximize coping skills, provide positive support system. Participant is able to verbalize types and ability to use techniques and skills needed for reducing stress and depression.   Initial Review & Psychosocial Screening: Initial Psych Review & Screening - 02/20/17 1427      Initial Review   Current issues with  None Identified      Family Dynamics   Good Support System?  Yes Daughter lives in [place], son      Barriers   Psychosocial barriers to participate in program  There are no identifiable barriers or psychosocial needs.;The patient should benefit from training in stress management and relaxation.      Screening Interventions   Interventions  Yes;To provide support and resources with identified psychosocial needs;Provide feedback  about the scores to participant;Encouraged to exercise    Expected Outcomes  Short Term goal: Utilizing psychosocial counselor, staff and physician to assist with identification of specific Stressors or current issues interfering with healing process. Setting desired goal for each stressor or current issue identified.;Long Term Goal: Stressors or current issues are controlled or eliminated.;Short Term goal: Identification and review with participant of any Quality of Life or Depression concerns found by scoring the questionnaire.;Long Term goal: The participant improves quality of Life and PHQ9 Scores as seen by post scores and/or verbalization of changes       Quality of Life Scores:  Quality of Life - 05/13/17 0917      Quality of Life Scores   Health/Function Pre  28.5 %    Health/Function Post  26.89 %    Health/Function % Change  -5.65 %    Socioeconomic Pre  30 %    Socioeconomic Post  30 %    Socioeconomic % Change   0 %    Psych/Spiritual Pre  29.14 %    Psych/Spiritual Post  26.57 %    Psych/Spiritual % Change  -8.82 %    Family Pre  30 %    Family Post  30 %    Family % Change  0 %    GLOBAL Pre  29.17 %    GLOBAL Post  27.82 %    GLOBAL % Change  -4.63 %      Scores of  19 and below usually indicate a poorer quality of life in these areas.  A difference of  2-3 points is a clinically meaningful difference.  A difference of 2-3 points in the total score of the Quality of Life Index has been associated with significant improvement in overall quality of life, self-image, physical symptoms, and general health in studies assessing change in quality of life.  PHQ-9: Recent Review Flowsheet Data    Depression screen [facility]/9 05/13/2017 03/31/2017 02/20/2017 06/28/2016 04/17/2016   Decreased Interest 0 0 0 0 0   Down, Depressed, Hopeless 0 0 0 0 0   PHQ - 2 Score 0 0 0 0 0   Altered sleeping 0 0 0 - -   Tired, decreased energy 0 0 1 - -   Change in appetite 0 0 0 - -   Feeling bad or  failure about yourself  0 0 0 - -   Trouble concentrating 0 0 0 - -   Moving slowly or fidgety/restless 0 0 0 - -   Suicidal thoughts 0 0 0 - -   PHQ-9 Score 0 0 1 - -   Difficult doing work/chores Not difficult at all Not difficult at all Not difficult at all - -     Interpretation of Total Score  Total Score Depression Severity:  1-4 = Minimal depression, 5-9 = Mild depression, 10-14 = Moderate depression, 15-19 = Moderately severe depression, 20-27 = Severe depression   Psychosocial Evaluation and Intervention: Psychosocial Evaluation - 03/18/17 0937      Psychosocial Evaluation & Interventions   Interventions  Encouraged to exercise with the program and follow exercise prescription    Comments  Counselor met with Ms. Wooley Inez Catalina) today for initial psychosocial evaluation.  She is an 84 year old who reports having a mild heart attack on 02/11/17.  She has a strong support system with adult children and grandchildren who live close by.  Lawrie lives alone but her grandson and his wife stay with her at night.  She is actively involved in her local church community as well.  Omunique is scheduled to have catarac surgery in the next week and may be required to miss a few sessions in this program during recovery.  She reports sleeping well and her appetite has decreased -  although she reports not having lost any weight.  Shaneque denies a history of depression or anxiety or any current symptoms. She is generally in a positive mood and reports minimal stress in her life at this time, other than her health.  Kamela has goals to increase her stamina and strength.  Staff will follow    Expected Outcomes  Lashonna will benefit from consistent exercise to achieve her stated goals.  The educational and psychoeducational components will be helpful in understanding and coping more positively with her condition.     Continue Psychosocial Services   Follow up required by staff       Psychosocial  Re-Evaluation: Psychosocial Re-Evaluation    Melville Name 04/01/17 0823 04/10/17 1008 04/24/17 1100         Psychosocial Re-Evaluation   Current issues with  Current Sleep Concerns;Current Stress Concerns  -  Current Sleep Concerns;Current Stress Concerns     Comments  Amariyah has been feeling weak overall.  She has been sleeping well, but last night her leg kept jumping and kept her awake.  She had a wellness exam yesterday and told them about rehab and they were pleased that she  is in here.  She continues to have minimal stress in her life.  Counselor met with Reginia for a follow up today.  She reports this program has been helpful and she has more energy since coming here.  Yazmeen continues to sleep well and maintains a positive attitude.  She reports some stress with her Yolanda Bonine and his significant other living in her home and not paying any rent.  Counselor brainstormed with Doniqua ways to set boundaries and healthy limits with her relatives.  She plans to implement a plan along with her daughter to follow through on this for her mental and emotional health.  Counselor commended Environmental education officer for her progress made and her commitment to consistency in exercise.    Aurea continues to do well in rehab and has more energy and stamina.  She was able to mop floors yesterday!!  She continues to struggle with her grandson and boundaries.  This is her major stressor.  She continues to try to stay postive.  She is sleeping better and getting 6-8hrs a night.  She did not want to talk openly about her grandson today.     Expected Outcomes  Short: Attend stress lecture and sleep lecture.  Long: Continue to maintain postive outlook and exercise regularly.   Trenika will practice some healthy stress management strategies with the help of her daughter.  Hiliana will continue to exercise for her overall health.    Short: Continue to work on setting boundaries for grandson.  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       Interventions  -  Stress management education  Encouraged to attend Cardiac Rehabilitation for the exercise;Stress management education     Continue Psychosocial Services   -  Follow up required by staff  Follow up required by counselor       Initial Review   Source of Stress Concerns  -  -  Family        Psychosocial Discharge (Final Psychosocial Re-Evaluation): Psychosocial Re-Evaluation - 04/24/17 1100      Psychosocial Re-Evaluation   Current issues with  Current Sleep Concerns;Current Stress Concerns    Comments  Recia continues to do well in rehab and has more energy and stamina.  She was able to mop floors yesterday!!  She continues to struggle with her grandson and boundaries.  This is her major stressor.  She continues to try to stay postive.  She is sleeping better and getting 6-8hrs a night.  She did not want to talk openly about her grandson today.    Expected Outcomes  Short: Continue to work on setting boundaries for grandson.  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     Interventions  Encouraged to attend Cardiac Rehabilitation for the exercise;Stress management education    Continue Psychosocial Services   Follow up required by counselor      Initial Review   Source of Stress Concerns  Family       Vocational Rehabilitation: Provide vocational rehab assistance to qualifying candidates.   Vocational Rehab Evaluation & Intervention: Vocational Rehab - 02/20/17 1434      Initial Vocational Rehab Evaluation & Intervention   Assessment shows need for Vocational Rehabilitation  No       Education: Education Goals: Education classes will be provided on a variety of topics geared toward better understanding of heart health and risk factor modification. Participant will state understanding/return demonstration of topics presented as noted by education test scores.  Learning Barriers/Preferences: Learning Barriers/Preferences -  02/20/17 1433      Learning  Barriers/Preferences   Learning Barriers  Hearing    Learning Preferences  None       Education Topics:  AED/CPR: - Group verbal and written instruction with the use of models to demonstrate the basic use of the AED with the basic ABC's of resuscitation.   Cardiac Rehab from 05/15/2017 in [facility] Cardiac and Pulmonary Rehab  Date  04/22/17  Educator  SB  Instruction Review Code  1- Verbalizes Understanding      General Nutrition Guidelines/Fats and Fiber: -Group instruction provided by verbal, written material, models and posters to present the general guidelines for heart healthy nutrition. Gives an explanation and review of dietary fats and fiber.   Cardiac Rehab from 05/15/2017 in [facility] Cardiac and Pulmonary Rehab  Date  04/08/17  Educator  CR  Instruction Review Code  1- Verbalizes Understanding      Controlling Sodium/Reading Food Labels: -Group verbal and written material supporting the discussion of sodium use in heart healthy nutrition. Review and explanation with models, verbal and written materials for utilization of the food label.   Cardiac Rehab from 05/15/2017 in [facility] Cardiac and Pulmonary Rehab  Date  04/15/17  Educator  CR  Instruction Review Code  1- Verbalizes Understanding      Exercise Physiology & General Exercise Guidelines: - Group verbal and written instruction with models to review the exercise physiology of the cardiovascular system and associated critical values. Provides general exercise guidelines with specific guidelines to those with heart or lung disease.    Cardiac Rehab from 05/15/2017 in [facility] Cardiac and Pulmonary Rehab  Date  05/01/17  Educator  [facility]  Instruction Review Code  1- Verbalizes Understanding      Aerobic Exercise & Resistance Training: - Gives group verbal and written instruction on the various components of exercise. Focuses on aerobic and resistive training programs and the benefits of this training and how to safely progress through  these programs..   Cardiac Rehab from 05/15/2017 in [facility] Cardiac and Pulmonary Rehab  Date  03/13/17  Educator  [facility]  Instruction Review Code  1- Verbalizes Understanding      Flexibility, Balance, Mind/Body Relaxation: Provides group verbal/written instruction on the benefits of flexibility and balance training, including mind/body exercise modes such as yoga, pilates and tai chi.  Demonstration and skill practice provided.   Cardiac Rehab from 05/15/2017 in [facility] Cardiac and Pulmonary Rehab  Date  05/08/17  Educator  AS  Instruction Review Code  1- Verbalizes Understanding      Stress and Anxiety: - Provides group verbal and written instruction about the health risks of elevated stress and causes of high stress.  Discuss the correlation between heart/lung disease and anxiety and treatment options. Review healthy ways to manage with stress and anxiety.   Cardiac Rehab from 05/15/2017 in [facility] Cardiac and Pulmonary Rehab  Date  05/13/17  Educator  [facility]  Instruction Review Code  1- Verbalizes Understanding      Depression: - Provides group verbal and written instruction on the correlation between heart/lung disease and depressed mood, treatment options, and the stigmas associated with seeking treatment.   Cardiac Rehab from 05/15/2017 in [facility] Cardiac and Pulmonary Rehab  Date  04/29/17  Educator  [facility]  Instruction Review Code  1- Verbalizes Understanding      Anatomy & Physiology of the Heart: - Group verbal and written instruction and models provide basic cardiac anatomy and physiology, with the coronary electrical and arterial  systems. Review of Valvular disease and Heart Failure   Cardiac Rehab from 05/15/2017 in [facility] Cardiac and Pulmonary Rehab  Date  04/03/17  Educator  CE  Instruction Review Code  1- Verbalizes Understanding      Cardiac Procedures: - Group verbal and written instruction to review commonly prescribed medications for heart disease. Reviews the medication, class of the  drug, and side effects. Includes the steps to properly store meds and maintain the prescription regimen. (beta blockers and nitrates)   Cardiac Rehab from 05/15/2017 in [facility] Cardiac and Pulmonary Rehab  Date  04/17/17  Educator  CE  Instruction Review Code  1- Verbalizes Understanding      Cardiac Medications I: - Group verbal and written instruction to review commonly prescribed medications for heart disease. Reviews the medication, class of the drug, and side effects. Includes the steps to properly store meds and maintain the prescription regimen.   Cardiac Medications II: -Group verbal and written instruction to review commonly prescribed medications for heart disease. Reviews the medication, class of the drug, and side effects. (all other drug classes)   Cardiac Rehab from 05/15/2017 in [facility] Cardiac and Pulmonary Rehab  Date  05/15/17 [facility] Factors]  Educator  [facility]  Instruction Review Code  1- Verbalizes Understanding       Go Sex-Intimacy & Heart Disease, Get SMART - Goal Setting: - Group verbal and written instruction through game format to discuss heart disease and the return to sexual intimacy. Provides group verbal and written material to discuss and apply goal setting through the application of the S.M.A.R.T. Method.   Cardiac Rehab from 05/15/2017 in [facility] Cardiac and Pulmonary Rehab  Date  04/17/17  Educator  CE  Instruction Review Code  1- Verbalizes Understanding      Other Matters of the Heart: - Provides group verbal, written materials and models to describe Stable Angina and Peripheral Artery. Includes description of the disease process and treatment options available to the cardiac patient.   Cardiac Rehab from 05/15/2017 in [facility] Cardiac and Pulmonary Rehab  Date  04/03/17  Educator  CE  Instruction Review Code  1- Verbalizes Understanding      Exercise & Equipment Safety: - Individual verbal instruction and demonstration of equipment use and safety with use of the  equipment.   Cardiac Rehab from 05/15/2017 in [facility] Cardiac and Pulmonary Rehab  Date  02/20/17  Educator  [facility]  Instruction Review Code  1- Verbalizes Understanding      Infection Prevention: - Provides verbal and written material to individual with discussion of infection control including proper hand washing and proper equipment cleaning during exercise session.   Cardiac Rehab from 05/15/2017 in [facility] Cardiac and Pulmonary Rehab  Date  02/20/17  Educator  [facility]  Instruction Review Code  1- Verbalizes Understanding      Falls Prevention: - Provides verbal and written material to individual with discussion of falls prevention and safety.   Cardiac Rehab from 05/15/2017 in [facility] Cardiac and Pulmonary Rehab  Date  02/20/17  Educator  [facility]  Instruction Review Code  1- Verbalizes Understanding      Diabetes: - Individual verbal and written instruction to review signs/symptoms of diabetes, desired ranges of glucose level fasting, after meals and with exercise. Acknowledge that pre and post exercise glucose checks will be done for 3 sessions at entry of program.   Know Your Numbers and Risk Factors: -Group verbal and written instruction about important numbers in your health.  Discussion of what are  risk factors and how they play a role in the disease process.  Review of Cholesterol, Blood Pressure, Diabetes, and BMI and the role they play in your overall health.   Cardiac Rehab from 05/15/2017 in [facility] Cardiac and Pulmonary Rehab  Date  05/15/17 [facility] Factors]  Educator  [facility]  Instruction Review Code  1- Verbalizes Understanding      Sleep Hygiene: -Provides group verbal and written instruction about how sleep can affect your health.  Define sleep hygiene, discuss sleep cycles and impact of sleep habits. Review good sleep hygiene tips.    Cardiac Rehab from 05/15/2017 in [facility] Cardiac and Pulmonary Rehab  Date  04/10/17  Educator  [facility]  Instruction Review Code  1- Verbalizes Understanding       Other: -Provides group and verbal instruction on various topics (see comments)   Knowledge Questionnaire Score: Knowledge Questionnaire Score - 05/13/17 0917      Knowledge Questionnaire Score   Pre Score  20/28    Post Score  27/28       Core Components/Risk Factors/Patient Goals at Admission: Personal Goals and Risk Factors at Admission - 02/20/17 1426      Core Components/Risk Factors/Patient Goals on Admission   Hypertension  Yes    Intervention  Provide education on lifestyle modifcations including regular physical activity/exercise, weight management, moderate sodium restriction and increased consumption of fresh fruit, vegetables, and low fat dairy, alcohol moderation, and smoking cessation.;Monitor prescription use compliance.    Expected Outcomes  Short Term: Continued assessment and intervention until BP is < 140/18m HG in hypertensive participants. < 130/850mHG in hypertensive participants with diabetes, heart failure or chronic kidney disease.;Long Term: Maintenance of blood pressure at goal levels.    Lipids  Yes    Intervention  Provide education and support for participant on nutrition & aerobic/resistive exercise along with prescribed medications to achieve LDL '70mg'$ , HDL >'40mg'$ .    Expected Outcomes  Short Term: Participant states understanding of desired cholesterol values and is compliant with medications prescribed. Participant is following exercise prescription and nutrition guidelines.;Long Term: Cholesterol controlled with medications as prescribed, with individualized exercise RX and with personalized nutrition plan. Value goals: LDL < '70mg'$ , HDL > 40 mg.       Core Components/Risk Factors/Patient Goals Review:  Goals and Risk Factor Review    Row Name 04/01/17 0826 04/24/17 1057           Core Components/Risk Factors/Patient Goals Review   Personal Goals Review  Weight Management/Obesity;Hypertension;Lipids  Weight  Management/Obesity;Hypertension;Lipids      Review  BeJozelynas been doing well in rehab.  Her weight has been pretty steady.  She saw doctor yesterday and blood pressure was slightly elevated at 130/60.  Her pressures have been good here in class.  She has the ability to to check it at home. She hasn't been checking it t home as she is afraid it would be too high.  We talked about the importance of tracking her blood pressure daily to keep record for doctor.  She is doing well on her medications.  BeCeriahontinues to do wel in rehab.  Her weight was down 3lbs today!!  She was pleased but doesn't want to lose too much.  Her blood pressures have been good and she checks them at least 3-4x a week at home.  She has been doing well on her mediations.       Expected Outcomes  Short: Start checking blood pressures regularly.  Long: Continue to  work on blood pressure management.  Short: Continue to check blood pressure regularly.  Long: Continue to work on risk factor modifications.         Core Components/Risk Factors/Patient Goals at Discharge (Final Review):  Goals and Risk Factor Review - 04/24/17 1057      Core Components/Risk Factors/Patient Goals Review   Personal Goals Review  Weight Management/Obesity;Hypertension;Lipids    Review  Tajha continues to do wel in rehab.  Her weight was down 3lbs today!!  She was pleased but doesn't want to lose too much.  Her blood pressures have been good and she checks them at least 3-4x a week at home.  She has been doing well on her mediations.     Expected Outcomes  Short: Continue to check blood pressure regularly.  Long: Continue to work on risk factor modifications.       ITP Comments: ITP Comments    Row Name 02/20/17 1417 03/12/17 0650 04/01/17 0821 04/09/17 0612 05/07/17 0619   ITP Comments  Medical review completed today. ITP sent to medical director for review, changes as needed and signature.  Documentation of the diagnosis  can be found in [facility] ADmission  02/10/2017  30 day review. Continue with ITP unless directed changes per Medical Director review.   Margan has been out after cataract surgery.  She was given a one week resistriction so she is back today.   30 Day review. Continue with ITP unless directed changes per Medical Director review.   30 day review. Continue with ITP unless directed changes per Medical Director review.      Comments: Discharge  ITP

## 2017-05-15 NOTE — Progress Notes (Signed)
Daily Session Note  Patient Details  Name: Jenna Young MRN: [id number] Date of Birth: December 27, 1931 Referring Provider:     Cardiac Rehab from 02/20/2017 in [facility] Cardiac and Pulmonary Rehab  Referring Provider  End      Encounter Date: 05/15/2017  Check In: Session Check In - 05/15/17 0831      Check-In   Location  [facility]-Cardiac & Pulmonary Rehab    Staff Present  Alberteen Sam, MA, RCEP, CCRP, Exercise Physiologist;Amanda Oletta Darter, BA, ACSM CEP, Exercise Physiologist;Carroll Enterkin, RN, BSN    Supervising physician immediately available to respond to emergencies  See telemetry face sheet for immediately available ER MD    Medication changes reported      No    Fall or balance concerns reported     No    Warm-up and Cool-down  Performed on first and last piece of equipment    Resistance Training Performed  Yes    VAD Patient?  No      Pain Assessment   Currently in Pain?  No/denies    Multiple Pain Sites  No          Social History   Tobacco Use  Smoking Status Former Smoker  . Last attempt to quit: 03/11/1968  . Years since quitting: 49.2  Smokeless Tobacco Never Used    Goals Met:  Independence with exercise equipment Exercise tolerated well Personal goals reviewed No report of cardiac concerns or symptoms Strength training completed today  Goals Unmet:  Not Applicable  Comments: Pt able to follow exercise prescription today without complaint.  Will continue to monitor for progression.  Edee graduated today from  rehab with 36 sessions completed.  Details of the patient's exercise prescription and what She needs to do in order to continue the prescription and progress were discussed with patient.  Patient was given a copy of prescription and goals.  Patient verbalized understanding.  Consandra plans to continue to exercise by walking and going back to [employer].     Dr. Emily Filbert is Medical Director for [place] [facility]  Pulmonary Rehabilitation.

## 2017-05-17 NOTE — Progress Notes (Signed)
Cardiology Office Note  Date:  05/19/2017   ID:  Jenna Young, DOB [birth year], MRN [id number]  PCP:  Jenna Greenspan, MD   Chief Complaint  Patient presents with  . other    3 month follow up. Meds reviewed by the pt. verbally. "doing well."     HPI:  Jenna Young is a pleasant 84 year old woman with past medical history of CAD, NSTEMI Cath 02/11/2017, severe RCA disease, unable to place stent  with palpitations, murmur, leg edema,  Prior history of vertigo DCIS who follows up today for her hypertension, near syncope/syncope,  stable angina  hospital Admission for NSTEMI , 02/10/2017 D/c 02/13/2017  Cath 1. Significant mid RCA disease with sequential, calcified 80 and 95% stenoses. Element of thrombus is likely also present. 2. 90% ostial stenosis involving small OM1 branch. 3. Mildly reduced left ventricular contraction with inferior hypokinesis (LVEF ~45%). 4. Mildly elevated left ventricular filling pressure. 5. Unsuccessful PCI to mid RCA due to inability to cross the mid RCA stenosis despite aggressive buddy wire support.  Recommendation : if she has unstable angina symptoms could considerPCI with atherectomy to mid RCA   Echo 41/3244 Systolic function was normal. The   estimated ejection fraction was in the range of 50% to 55%.   Hypokinesis of the inferior myocardium.  In follow-up she denies any anginal symptoms She is walking daily at the mall Does not go fast, walks with a friend  She does have chronic fatigue Attributes this to her Anemia, HCT 31, stable  On B12 Has not had iron infusion  Prior history of vertigo, symptoms have come back recently Previously completed rehab/PT for her vertigo  Legs feel weak, but stable She has not required any nitro for any chest burning Previously completed cardiac rehab  EKG personally reviewed by myself on todays visit Shows normal sinus rhythm with rate 75 bpm intraventricular conduction delay, left axis  deviation  Other past medical history reviewed diagnosis of DCIS, had a biopsy bx Completed XRT   previously on amlodipine but wanted to change secondary to leg edema Started on bystolic Blood pressure continue to run high Started on HCTZ every other day  could not afford Bystolic long term   was changed to clonidine.  Presented to the emergency room December 31 2015 with near syncope , dehydration  "Fell asleep in the sun " while she was on a swing set, was difficult to arouse, was taken inside by family, had large bowel movement   in the emergency room heart rate 62 bpm , sodium down to 129, creatinine elevated Notes indicate she was only taking clonidine once a day   Seen by our office on 01/03/2016  Blood pressure was 196/80 in the office, she was not taking any of her medications She declined a 30 day monitor, declined ischemic evaluation Restarted on losartan 100 mg daily, HCTZ every other day, Imdur 30 mg daily In follow-up blood pressures 140s up to 150 Recommended to increase Imdur up to 60 mg daily on January 08 2016 On November 5, presented to the emergency room for near-syncope EMS noted systolic pressure 92. Emergency room low pressure 130/62 Orthostatics negative in the emergency room UTI was treated with keflex, Imdur decreased back to 30 mg daily On lab work she did not appear dehydrated, hematocrit 31.7 Subsequent lab work sodium was 126 On her last clinic visit HCTZ held, amlodipine held, continued on losartan 100 daily and Imdur 30 daily  Previous lab work reviewed total  cholesterol 200, LDL 115 Hematocrit 32 Previous abdominal symptoms with iron pills, dizziness, possible vasovagal symptoms   PMH:   has a past medical history of Allergic rhinitis, Anemia, Anxiety, GERD (gastroesophageal reflux disease), History of radiation therapy (04/29/16- 05/24/16), Hyperlipidemia, Hypertension, Osteopenia, and Osteoporosis.  PSH:    Past Surgical History:  Procedure  Laterality Date  . APPENDECTOMY    . BREAST LUMPECTOMY WITH RADIOACTIVE SEED LOCALIZATION Left 02/27/2016   Procedure: LEFT BREAST LUMPECTOMY WITH RADIOACTIVE SEED LOCALIZATION;  Surgeon: Jenna Messing III, MD;  Location: [place];  Service: General;  Laterality: Left;  . BREAST SURGERY  10/10   benign biopsy  negative  . CESAREAN SECTION    . CHOLECYSTECTOMY    . CORONARY STENT INTERVENTION N/A 02/11/2017   Procedure: CORONARY STENT INTERVENTION;  Surgeon: Jenna Bush, MD;  Location: [place] [facility];  Service: Cardiovascular;  Laterality: N/A;  . FEMUR FRACTURE SURGERY Right 2006  . LEFT HEART CATH AND CORONARY ANGIOGRAPHY N/A 02/11/2017   Procedure: LEFT HEART CATH AND CORONARY ANGIOGRAPHY;  Surgeon: Jenna Bush, MD;  Location: [place] [facility];  Service: Cardiovascular;  Laterality: N/A;  . OVARIAN CYST SURGERY    . RE-EXCISION OF BREAST LUMPECTOMY Left 04/01/2016   Procedure: RE-EXCISION OF LEFT BREAST MEDIAL MARGIN;  Surgeon: Jenna Messing III, MD;  Location: [place];  Service: General;  Laterality: Left;    Current Outpatient Medications  Medication Sig Dispense Refill  . aspirin (ASPIRIN EC) 81 MG EC tablet Take 81 mg by mouth daily. Swallow whole.     . Cholecalciferol (VITAMIN D) 2000 UNITS CAPS Take 1 capsule by mouth daily.      . clopidogrel (PLAVIX) 75 MG tablet TAKE ONE TABLET BY MOUTH DAILY WITH BREAKFAST 30 tablet 6  . cyanocobalamin (,VITAMIN B-12,) 1000 MCG/ML injection Inject 1,000 mcg into the muscle every 3 (three) months.    . isosorbide mononitrate (IMDUR) 30 MG 24 hr tablet Take 0.5 tablets (15 mg total) by mouth daily. 15 tablet 0  . nitroGLYCERIN (NITROSTAT) 0.4 MG SL tablet Place 1 tablet (0.4 mg total) under the tongue every 5 (five) minutes x 3 doses as needed for chest pain. 30 tablet 2  . omeprazole (PRILOSEC) 20 MG capsule Take 1 capsule (20 mg total) by mouth daily. (Patient taking differently: Take 20 mg by mouth  daily as needed (acid reflux). ) 30 capsule 5  . atorvastatin (LIPITOR) 40 MG tablet Take 1 tablet (40 mg total) by mouth daily. 90 tablet 3   No current facility-administered medications for this visit.      Allergies:   Buspirone hcl; Codeine; Hydrochlorothiazide; Lansoprazole; Minocycline hcl; Penicillins; Phenergan [promethazine hcl]; Ramipril; and Sulfa antibiotics   Social History:  The patient  reports that she quit smoking about 49 years ago. she has never used smokeless tobacco. She reports that she does not drink alcohol or use drugs.   Family History:   family history includes Cancer in her maternal aunt; Diabetes in her maternal aunt; Heart disease in her father; Hypertension in her mother; Macular degeneration in her sister.    Review of Systems: Review of Systems  Constitutional: Negative.   Respiratory: Negative.   Cardiovascular: Negative.   Gastrointestinal: Negative.   Musculoskeletal: Positive for back pain.  Neurological: Positive for dizziness.  Psychiatric/Behavioral: Negative.   All other systems reviewed and are negative.    PHYSICAL EXAM: VS:  BP 140/60 (BP Location: Left Arm, Patient Position: Sitting, Cuff Size: Normal)  Pulse 75   Ht 5\' 1"  (1.549 m)   Wt 136 lb 4 oz (61.8 kg)   BMI 25.74 kg/m  , BMI Body mass index is 25.74 kg/m.  Constitutional:  oriented to person, place, and time. No distress.  HENT:  Head: Normocephalic and atraumatic.  Eyes:  no discharge. No scleral icterus.  Neck: Normal range of motion. Neck supple. No JVD present.  Cardiovascular: Cardiac: RRR; 1+ SEM RSB,  no rubs, or gallops,no edema  Pulmonary/Chest: Effort normal and breath sounds normal. No stridor. No respiratory distress.  no wheezes.  no rales.  no tenderness.  Abdominal: Soft.  no distension.  no tenderness.  Musculoskeletal: Normal range of motion.  no  tenderness or deformity.  Neurological:  normal muscle tone. Coordination normal. No atrophy Skin: Skin  is warm and dry. No rash noted. not diaphoretic.  Psychiatric:  normal mood and affect. behavior is normal. Thought content normal.   Recent Labs: 03/31/2017: ALT 11; BUN 10; Creatinine, Ser 0.85; Hemoglobin 10.5; Platelets 310.0; Potassium 4.2; Sodium 138; TSH 1.19    Lipid Panel Lab Results  Component Value Date   CHOL 125 03/31/2017   HDL 50.50 03/31/2017   LDLCALC 56 03/31/2017   TRIG 92.0 03/31/2017      Wt Readings from Last 3 Encounters:  05/19/17 136 lb 4 oz (61.8 kg)  05/06/17 138 lb (62.6 kg)  04/01/17 136 lb 4 oz (61.8 kg)       ASSESSMENT AND PLAN:  Essential hypertension, benign - Plan: EKG 12-Lead Blood pressure is well controlled on today's visit. No changes made to the medications. Stable  Palpitations - Plan: EKG 12-Lead Denies any symptoms, active at baseline, no medication changes made  CAD, non-STEMI Long discussion with patient  Discussed previous results again with her She is not having any chest burning concerning for angina Recommend if she has any symptoms concerning for angina we could consider intervention Discussed risk and benefit  Pure hypercholesterolemia Continue Lipitor 40 daily She does have some malaise, myalgias but tolerating the medication well so far with good cholesterol numbers at goal  Ductal carcinoma in situ (DCIS) of left breast Treatment is complete, radiation to the left Stable per the patient  Syncope No episodes Continue current medications  Long discussion concerning anginal symptoms, previous catheterization results  Total encounter time more than 45 minutes  Greater than 50% was spent in counseling and coordination of care with the patient   Disposition:   F/U  3 months   Orders Placed This Encounter  Procedures  . EKG 12-Lead     Signed, Esmond Plants, M.D., Ph.D. 05/19/2017  [place], [place]

## 2017-05-19 ENCOUNTER — Ambulatory Visit (INDEPENDENT_AMBULATORY_CARE_PROVIDER_SITE_OTHER): Payer: Medicare Other | Admitting: Cardiovascular Disease

## 2017-05-19 ENCOUNTER — Encounter: Payer: Self-pay | Admitting: Cardiovascular Disease

## 2017-05-19 VITALS — BP 140/60 | HR 75 | Ht 61.0 in | Wt 136.2 lb

## 2017-05-19 DIAGNOSIS — E7800 Pure hypercholesterolemia, unspecified: Secondary | ICD-10-CM

## 2017-05-19 DIAGNOSIS — I10 Essential (primary) hypertension: Secondary | ICD-10-CM | POA: Diagnosis not present

## 2017-05-19 DIAGNOSIS — I25118 Atherosclerotic heart disease of native coronary artery with other forms of angina pectoris: Secondary | ICD-10-CM

## 2017-05-19 DIAGNOSIS — R002 Palpitations: Secondary | ICD-10-CM

## 2017-05-19 DIAGNOSIS — R55 Syncope and collapse: Secondary | ICD-10-CM

## 2017-05-19 MED ORDER — ATORVASTATIN CALCIUM 40 MG PO TABS: 40.0000 mg | ORAL_TABLET | Freq: Every day | ORAL | 3 refills | Status: DC

## 2017-05-19 NOTE — Patient Instructions (Addendum)

## 2017-05-21 ENCOUNTER — Other Ambulatory Visit: Payer: Self-pay | Admitting: General Surgery

## 2017-05-21 DIAGNOSIS — Z853 Personal history of malignant neoplasm of breast: Secondary | ICD-10-CM

## 2017-06-09 ENCOUNTER — Ambulatory Visit
Admission: RE | Admit: 2017-06-09 | Discharge: 2017-06-09 | Disposition: A | Discharge status: Home / Self Care | Payer: Medicare Other | Source: Ambulatory Visit | Attending: General Surgery | Admitting: General Surgery

## 2017-06-09 DIAGNOSIS — Z853 Personal history of malignant neoplasm of breast: Secondary | ICD-10-CM

## 2017-06-09 DIAGNOSIS — R928 Other abnormal and inconclusive findings on diagnostic imaging of breast: Secondary | ICD-10-CM | POA: Diagnosis not present

## 2017-06-09 HISTORY — DX: Personal history of irradiation: Z92.3

## 2017-07-02 ENCOUNTER — Ambulatory Visit (INDEPENDENT_AMBULATORY_CARE_PROVIDER_SITE_OTHER): Payer: Medicare Other

## 2017-07-02 DIAGNOSIS — Z961 Presence of intraocular lens: Secondary | ICD-10-CM | POA: Diagnosis not present

## 2017-07-02 DIAGNOSIS — H5202 Hypermetropia, left eye: Secondary | ICD-10-CM | POA: Diagnosis not present

## 2017-07-02 DIAGNOSIS — H52223 Regular astigmatism, bilateral: Secondary | ICD-10-CM | POA: Diagnosis not present

## 2017-07-02 DIAGNOSIS — H524 Presbyopia: Secondary | ICD-10-CM | POA: Diagnosis not present

## 2017-07-02 DIAGNOSIS — I10 Essential (primary) hypertension: Secondary | ICD-10-CM | POA: Diagnosis not present

## 2017-07-02 DIAGNOSIS — E538 Deficiency of other specified B group vitamins: Secondary | ICD-10-CM | POA: Diagnosis not present

## 2017-07-02 DIAGNOSIS — H16142 Punctate keratitis, left eye: Secondary | ICD-10-CM | POA: Diagnosis not present

## 2017-07-02 DIAGNOSIS — H02055 Trichiasis without entropian left lower eyelid: Secondary | ICD-10-CM | POA: Diagnosis not present

## 2017-07-02 MED ORDER — CYANOCOBALAMIN 1000 MCG/ML IJ SOLN
1000.0000 ug | Freq: Once | INTRAMUSCULAR | Status: AC
Start: 2017-07-02 — End: 2017-07-02
  Administered 2017-07-02: 1000 ug via INTRAMUSCULAR

## 2017-08-05 ENCOUNTER — Other Ambulatory Visit: Payer: Self-pay | Admitting: *Deleted

## 2017-08-05 MED ORDER — OMEPRAZOLE 20 MG PO CPDR: 20.0000 mg | DELAYED_RELEASE_CAPSULE | Freq: Every day | ORAL | 1 refills | Status: DC | PRN

## 2017-08-05 MED ORDER — CLOPIDOGREL BISULFATE 75 MG PO TABS: 75.0000 mg | ORAL_TABLET | Freq: Every day | ORAL | 1 refills | Status: DC

## 2017-09-03 DIAGNOSIS — H5202 Hypermetropia, left eye: Secondary | ICD-10-CM | POA: Diagnosis not present

## 2017-09-03 DIAGNOSIS — H524 Presbyopia: Secondary | ICD-10-CM | POA: Diagnosis not present

## 2017-09-03 DIAGNOSIS — H16142 Punctate keratitis, left eye: Secondary | ICD-10-CM | POA: Diagnosis not present

## 2017-09-03 DIAGNOSIS — I10 Essential (primary) hypertension: Secondary | ICD-10-CM | POA: Diagnosis not present

## 2017-09-03 DIAGNOSIS — Z961 Presence of intraocular lens: Secondary | ICD-10-CM | POA: Diagnosis not present

## 2017-09-03 DIAGNOSIS — H02055 Trichiasis without entropian left lower eyelid: Secondary | ICD-10-CM | POA: Diagnosis not present

## 2017-09-03 DIAGNOSIS — H52223 Regular astigmatism, bilateral: Secondary | ICD-10-CM | POA: Diagnosis not present

## 2017-09-15 ENCOUNTER — Other Ambulatory Visit: Payer: Self-pay | Admitting: Cardiovascular Disease

## 2017-09-15 NOTE — Telephone Encounter (Signed)
Please review for refill. Thanks!  

## 2017-09-16 NOTE — Telephone Encounter (Signed)
Per Jenna Young okay to refill the Isosorbide mono 30 mg with instructions take one-half tablet by mouth daily.

## 2017-09-16 NOTE — Telephone Encounter (Signed)
OK to refill

## 2017-10-01 ENCOUNTER — Ambulatory Visit: Payer: Medicare Other

## 2017-10-08 ENCOUNTER — Ambulatory Visit (INDEPENDENT_AMBULATORY_CARE_PROVIDER_SITE_OTHER): Payer: Medicare Other

## 2017-10-08 DIAGNOSIS — E538 Deficiency of other specified B group vitamins: Secondary | ICD-10-CM | POA: Diagnosis not present

## 2017-10-08 MED ORDER — CYANOCOBALAMIN 1000 MCG/ML IJ SOLN
1000.0000 ug | Freq: Once | INTRAMUSCULAR | Status: AC
Start: 2017-10-08 — End: 2017-10-08
  Administered 2017-10-08: 1000 ug via INTRAMUSCULAR

## 2017-10-08 NOTE — Progress Notes (Signed)
Per orders of Dr. Glori Bickers, injection of vitamin A15 given by Brenton Grills. Patient tolerated injection well.

## 2017-10-15 ENCOUNTER — Telehealth: Payer: Self-pay | Admitting: Cardiovascular Disease

## 2017-10-15 NOTE — Telephone Encounter (Signed)
S/w patient.  She had episode of chest "discomfort" that lasted about 2 hours.  Denied having any shortness of breath, dizziness, left arm pain or jaw pain with it.  It went across her chest from shoulder to shoulder.  States pain was not "bad" just a "discomfort." She took a nitroglycerin and it gradually wore off. She was going to have her daughter take her to the ER but she could not get her to wake up. She has not had any more pain today. She participates in an exercise program and they started using weights about 3 weeks ago. She said they used them yesterday. Patient is due for 6 month f/u in September. Scheduled appointment.  Advised patient to call us back or go to the ER if the pain returns and otherwise keep f/u as scheduled. She verbalized understanding.

## 2017-10-15 NOTE — Telephone Encounter (Signed)
Pt c/o of Chest Pain: STAT if CP now or developed within 24 hours  1. Are you having CP right now? No last night   2. Are you experiencing any other symptoms (ex. SOB, nausea, vomiting, sweating)?  Cp across shoulders in front denies other sx above   3. How long have you been experiencing CP? Last week interim   4. Is your CP continuous or coming and going? Interim   5. Have you taken Nitroglycerin?  Took last night x 1 gave headache scared to take aother eased pain after a while  ? Patient wants to speak with nurse asap

## 2017-10-15 NOTE — Telephone Encounter (Signed)
Patient calling requesting to see Dr. Celesta Aver next available 9/6 but declined, wishes to be seen sooner Has been having chest pains and unable to sleep Please call to discuss

## 2017-10-16 ENCOUNTER — Inpatient Hospital Stay
Admission: EM | Admit: 2017-10-16 | Discharge: 2017-10-18 | DRG: 281 | Disposition: A | Discharge status: Home / Self Care | Payer: Medicare Other | Attending: Internal Medicine | Admitting: Internal Medicine

## 2017-10-16 ENCOUNTER — Encounter: Payer: Self-pay | Admitting: Emergency Medicine

## 2017-10-16 ENCOUNTER — Other Ambulatory Visit: Payer: Self-pay

## 2017-10-16 DIAGNOSIS — I214 Non-ST elevation (NSTEMI) myocardial infarction: Secondary | ICD-10-CM | POA: Diagnosis not present

## 2017-10-16 DIAGNOSIS — I959 Hypotension, unspecified: Secondary | ICD-10-CM | POA: Diagnosis present

## 2017-10-16 DIAGNOSIS — E860 Dehydration: Secondary | ICD-10-CM | POA: Diagnosis present

## 2017-10-16 DIAGNOSIS — K219 Gastro-esophageal reflux disease without esophagitis: Secondary | ICD-10-CM | POA: Diagnosis present

## 2017-10-16 DIAGNOSIS — Z853 Personal history of malignant neoplasm of breast: Secondary | ICD-10-CM | POA: Diagnosis not present

## 2017-10-16 DIAGNOSIS — Z6824 Body mass index (BMI) 24.0-24.9, adult: Secondary | ICD-10-CM

## 2017-10-16 DIAGNOSIS — Z7902 Long term (current) use of antithrombotics/antiplatelets: Secondary | ICD-10-CM | POA: Diagnosis not present

## 2017-10-16 DIAGNOSIS — R55 Syncope and collapse: Secondary | ICD-10-CM

## 2017-10-16 DIAGNOSIS — E441 Mild protein-calorie malnutrition: Secondary | ICD-10-CM | POA: Diagnosis present

## 2017-10-16 DIAGNOSIS — Z880 Allergy status to penicillin: Secondary | ICD-10-CM | POA: Diagnosis not present

## 2017-10-16 DIAGNOSIS — E785 Hyperlipidemia, unspecified: Secondary | ICD-10-CM | POA: Diagnosis not present

## 2017-10-16 DIAGNOSIS — E440 Moderate protein-calorie malnutrition: Secondary | ICD-10-CM

## 2017-10-16 DIAGNOSIS — M810 Age-related osteoporosis without current pathological fracture: Secondary | ICD-10-CM | POA: Diagnosis present

## 2017-10-16 DIAGNOSIS — D509 Iron deficiency anemia, unspecified: Secondary | ICD-10-CM | POA: Diagnosis present

## 2017-10-16 DIAGNOSIS — Z923 Personal history of irradiation: Secondary | ICD-10-CM | POA: Diagnosis not present

## 2017-10-16 DIAGNOSIS — I2510 Atherosclerotic heart disease of native coronary artery without angina pectoris: Secondary | ICD-10-CM | POA: Diagnosis present

## 2017-10-16 DIAGNOSIS — Z955 Presence of coronary angioplasty implant and graft: Secondary | ICD-10-CM | POA: Diagnosis not present

## 2017-10-16 DIAGNOSIS — Z888 Allergy status to other drugs, medicaments and biological substances status: Secondary | ICD-10-CM

## 2017-10-16 DIAGNOSIS — Z7982 Long term (current) use of aspirin: Secondary | ICD-10-CM

## 2017-10-16 DIAGNOSIS — Z882 Allergy status to sulfonamides status: Secondary | ICD-10-CM | POA: Diagnosis not present

## 2017-10-16 DIAGNOSIS — I252 Old myocardial infarction: Secondary | ICD-10-CM | POA: Diagnosis not present

## 2017-10-16 DIAGNOSIS — Z885 Allergy status to narcotic agent status: Secondary | ICD-10-CM | POA: Diagnosis not present

## 2017-10-16 DIAGNOSIS — I10 Essential (primary) hypertension: Secondary | ICD-10-CM | POA: Diagnosis not present

## 2017-10-16 DIAGNOSIS — I340 Nonrheumatic mitral (valve) insufficiency: Secondary | ICD-10-CM | POA: Diagnosis not present

## 2017-10-16 DIAGNOSIS — Z87891 Personal history of nicotine dependence: Secondary | ICD-10-CM | POA: Diagnosis not present

## 2017-10-16 DIAGNOSIS — I219 Acute myocardial infarction, unspecified: Secondary | ICD-10-CM | POA: Diagnosis not present

## 2017-10-16 DIAGNOSIS — Z79899 Other long term (current) drug therapy: Secondary | ICD-10-CM | POA: Diagnosis not present

## 2017-10-16 LAB — URINALYSIS, COMPLETE (UACMP) WITH MICROSCOPIC
Bacteria, UA: NONE SEEN
Bilirubin Urine: NEGATIVE
Glucose, UA: NEGATIVE mg/dL
Hgb urine dipstick: NEGATIVE
Ketones, ur: NEGATIVE mg/dL
Nitrite: NEGATIVE
Protein, ur: NEGATIVE mg/dL
Specific Gravity, Urine: 1.009 (ref 1.005–1.030)
pH: 7 (ref 5.0–8.0)

## 2017-10-16 LAB — CBC
HCT: 31 % — ABNORMAL LOW (ref 35.0–47.0)
Hemoglobin: 10.2 g/dL — ABNORMAL LOW (ref 12.0–16.0)
MCH: 25.4 pg — ABNORMAL LOW (ref 26.0–34.0)
MCHC: 32.8 g/dL (ref 32.0–36.0)
MCV: 77.4 fL — ABNORMAL LOW (ref 80.0–100.0)
Platelets: 214 10*3/uL (ref 150–440)
RBC: 4.01 MIL/uL (ref 3.80–5.20)
RDW: 18.2 % — ABNORMAL HIGH (ref 11.5–14.5)
WBC: 7.4 10*3/uL (ref 3.6–11.0)

## 2017-10-16 LAB — HEPARIN LEVEL (UNFRACTIONATED): Heparin Unfractionated: 0.48 IU/mL (ref 0.30–0.70)

## 2017-10-16 LAB — TROPONIN I
Troponin I: 2.48 ng/mL (ref <0.03)
Troponin I: 1.8 ng/mL (ref <0.03)
Troponin I: 2.53 ng/mL (ref <0.03)

## 2017-10-16 LAB — BASIC METABOLIC PANEL
Anion gap: 6 (ref 5–15)
BUN: 12 mg/dL (ref 8–23)
CO2: 23 mmol/L (ref 22–32)
Calcium: 8.5 mg/dL — ABNORMAL LOW (ref 8.9–10.3)
Chloride: 108 mmol/L (ref 98–111)
Creatinine, Ser: 0.87 mg/dL (ref 0.44–1.00)
GFR calc Af Amer: >60 mL/min (ref >60)
GFR calc non Af Amer: 60 mL/min — ABNORMAL LOW (ref >60)
Glucose, Bld: 107 mg/dL — ABNORMAL HIGH (ref 70–99)
Potassium: 4.2 mmol/L (ref 3.5–5.1)
Sodium: 137 mmol/L (ref 135–145)

## 2017-10-16 LAB — PROTIME-INR
INR: 1.1
Prothrombin Time: 14.1 seconds (ref 11.4–15.2)

## 2017-10-16 LAB — APTT: aPTT: <24 seconds — ABNORMAL LOW (ref 24–36)

## 2017-10-16 MED ORDER — ASPIRIN 81 MG PO TBEC: 81.0000 mg | DELAYED_RELEASE_TABLET | Freq: Every day | ORAL | Status: DC

## 2017-10-16 MED ORDER — ATORVASTATIN CALCIUM 20 MG PO TABS: 40.0000 mg | ORAL_TABLET | Freq: Every day | ORAL | Status: DC

## 2017-10-16 MED ORDER — FAMOTIDINE IN NACL 20-0.9 MG/50ML-% IV SOLN
20.0000 mg | Freq: Two times a day (BID) | INTRAVENOUS | Status: DC
  Administered 2017-10-16: 20 mg via INTRAVENOUS
  Filled 2017-10-16 (×2): qty 50

## 2017-10-16 MED ORDER — ASPIRIN EC 81 MG PO TBEC
81.0000 mg | DELAYED_RELEASE_TABLET | Freq: Every day | ORAL | Status: DC
  Administered 2017-10-17 – 2017-10-18 (×2): 81 mg via ORAL
  Filled 2017-10-16 (×2): qty 1

## 2017-10-16 MED ORDER — HEPARIN BOLUS VIA INFUSION
3500.0000 [IU] | Freq: Once | INTRAVENOUS | Status: AC
  Administered 2017-10-16: 3500 [IU] via INTRAVENOUS
  Filled 2017-10-16: qty 3500

## 2017-10-16 MED ORDER — ASPIRIN 81 MG PO CHEW
324.0000 mg | CHEWABLE_TABLET | Freq: Once | ORAL | Status: AC
  Administered 2017-10-16: 324 mg via ORAL
  Filled 2017-10-16: qty 4

## 2017-10-16 MED ORDER — MORPHINE SULFATE (PF) 2 MG/ML IV SOLN: 2.0000 mg | INTRAVENOUS | Status: DC | PRN

## 2017-10-16 MED ORDER — ALPRAZOLAM 0.5 MG PO TABS: 0.2500 mg | ORAL_TABLET | Freq: Two times a day (BID) | ORAL | Status: DC | PRN

## 2017-10-16 MED ORDER — ONDANSETRON HCL 4 MG/2ML IJ SOLN: 4.0000 mg | Freq: Four times a day (QID) | INTRAMUSCULAR | Status: DC | PRN

## 2017-10-16 MED ORDER — PANTOPRAZOLE SODIUM 40 MG PO TBEC
40.0000 mg | DELAYED_RELEASE_TABLET | Freq: Every day | ORAL | Status: DC
  Administered 2017-10-17 – 2017-10-18 (×2): 40 mg via ORAL
  Filled 2017-10-16 (×2): qty 1

## 2017-10-16 MED ORDER — METOPROLOL TARTRATE 25 MG PO TABS
25.0000 mg | ORAL_TABLET | Freq: Two times a day (BID) | ORAL | Status: DC
  Administered 2017-10-16 – 2017-10-18 (×5): 25 mg via ORAL
  Filled 2017-10-16 (×5): qty 1

## 2017-10-16 MED ORDER — HEPARIN (PORCINE) IN NACL 100-0.45 UNIT/ML-% IJ SOLN
750.0000 [IU]/h | INTRAMUSCULAR | Status: DC
  Administered 2017-10-16 (×2): 750 [IU]/h via INTRAVENOUS
  Filled 2017-10-16 (×2): qty 250

## 2017-10-16 MED ORDER — SODIUM CHLORIDE 0.9% FLUSH
3.0000 mL | Freq: Two times a day (BID) | INTRAVENOUS | Status: DC
  Administered 2017-10-16 – 2017-10-18 (×5): 3 mL via INTRAVENOUS

## 2017-10-16 MED ORDER — ACETAMINOPHEN 325 MG PO TABS: 650.0000 mg | ORAL_TABLET | ORAL | Status: DC | PRN

## 2017-10-16 MED ORDER — ATORVASTATIN CALCIUM 20 MG PO TABS
40.0000 mg | ORAL_TABLET | Freq: Every day | ORAL | Status: DC
  Administered 2017-10-16 – 2017-10-17 (×2): 40 mg via ORAL
  Filled 2017-10-16 (×2): qty 2

## 2017-10-16 MED ORDER — NITROGLYCERIN 0.4 MG SL SUBL: 0.4000 mg | SUBLINGUAL_TABLET | SUBLINGUAL | Status: DC | PRN

## 2017-10-16 MED ORDER — SODIUM CHLORIDE 0.9% FLUSH: 3.0000 mL | INTRAVENOUS | Status: DC | PRN

## 2017-10-16 MED ORDER — VITAMIN D3 25 MCG (1000 UNIT) PO TABS
2000.0000 [IU] | ORAL_TABLET | Freq: Every day | ORAL | Status: DC
  Administered 2017-10-17 – 2017-10-18 (×2): 2000 [IU] via ORAL
  Filled 2017-10-16 (×2): qty 2

## 2017-10-16 MED ORDER — CLOPIDOGREL BISULFATE 75 MG PO TABS
75.0000 mg | ORAL_TABLET | Freq: Every day | ORAL | Status: DC
  Administered 2017-10-17 – 2017-10-18 (×2): 75 mg via ORAL
  Filled 2017-10-16 (×2): qty 1

## 2017-10-16 MED ORDER — SODIUM CHLORIDE 0.9 % IV SOLN: 250.0000 mL | INTRAVENOUS | Status: DC | PRN

## 2017-10-16 MED ORDER — ISOSORBIDE MONONITRATE ER 30 MG PO TB24
15.0000 mg | ORAL_TABLET | Freq: Every day | ORAL | Status: DC
  Administered 2017-10-17 – 2017-10-18 (×2): 15 mg via ORAL
  Filled 2017-10-16 (×2): qty 1

## 2017-10-16 NOTE — ED Provider Notes (Signed)
[facility] Emergency Department Provider Note  ____________________________________________  Time seen: Approximately 12:09 PM  I have reviewed the triage vital signs and the nursing notes.   HISTORY  Chief Complaint Loss of Consciousness   HPI Jenna Young is a 84 y.o. female with a history of CAD, NSTEMI, hypertension, hyperlipidemia who presents for evaluation of a syncopal event.  Patient reports that she woke up this morning in her usual state of health.  A couple of hours later she started feeling very weak.  She went to have her hair done.  After that was over she was standing to pay when she started feeling dizzy.  She sat down and had a syncopal event.  She did not fall or sustain any injuries from this event.  When EMS arrived patient's blood glucose was 137 and her blood pressure was 74/43 for which she received 500 cc of fluid.  Patient denies headache, palpitations, chest pain, shortness of breath preceding or after the syncopal event.  Patient endorses compliance with her medications.  She does report 2 nights ago she had a burning sensation across her chest which was similar to her prior episode when she was told she had a heart attack.  No further episodes of burning sensation in her chest.  She denies chest pain at this time.  Past Medical History:  Diagnosis Date  . Allergic rhinitis   . Anemia   . Anxiety   . GERD (gastroesophageal reflux disease)   . History of radiation therapy 04/29/16- 05/24/16   Left Breast 40.05 Gy in 15 fractions, Left Breast boost 10 Gy in 5 fractions.   . Hyperlipidemia   . Hypertension    treated by Dr Thurnell Garbe recently with syncope episode  . Osteopenia   . Osteoporosis   . Personal history of radiation therapy 2018    Patient Active Problem List   Diagnosis Date Noted  . Encounter for screening mammogram for breast cancer 04/01/2017  . H/O syncope 02/28/2017  . CAD (coronary artery disease), native  coronary artery 02/21/2017  . NSTEMI (non-ST elevated myocardial infarction) ([place]) 02/10/2017  . Carcinoma of central portion of left breast in female, estrogen receptor negative ([place]) 04/17/2016  . Ductal carcinoma in situ (DCIS) of left breast 02/26/2016  . Varicose vein of leg 10/09/2015  . Pedal edema 10/09/2015  . Heart murmur 10/09/2015  . History of spinal fracture 05/07/2015  . Lumbar pain 05/03/2015  . Dysthymia 09/20/2013  . Encounter for Medicare annual wellness exam 02/03/2013  . BPPV (benign paroxysmal positional vertigo) 06/15/2012  . Chronic right ear pain 03/16/2012  . Colon cancer screening 01/17/2012  . Anemia 01/17/2012  . GRIEF REACTION 08/01/2009  . Vitamin D deficiency 08/19/2008  . ANXIETY 01/29/2008  . Essential hypertension, benign 09/25/2007  . B12 deficiency 09/24/2006  . Hyperlipidemia 09/24/2006  . Venous (peripheral) insufficiency 09/24/2006  . ALLERGIC RHINITIS 09/24/2006  . GERD 09/24/2006  . Osteoporosis 09/24/2006    Past Surgical History:  Procedure Laterality Date  . APPENDECTOMY    . BREAST BIOPSY Left 11/29/2008  . BREAST BIOPSY Left 01/26/2016   malignant  . BREAST EXCISIONAL BIOPSY Left 12/26/2008  . BREAST LUMPECTOMY Left 02/27/2016  . BREAST LUMPECTOMY Left 04/01/2016  . BREAST LUMPECTOMY WITH RADIOACTIVE SEED LOCALIZATION Left 02/27/2016   Procedure: LEFT BREAST LUMPECTOMY WITH RADIOACTIVE SEED LOCALIZATION;  Surgeon: Autumn Messing III, MD;  Location: [place];  Service: General;  Laterality: Left;  . BREAST SURGERY  10/10  benign biopsy  negative  . CESAREAN SECTION    . CHOLECYSTECTOMY    . CORONARY STENT INTERVENTION N/A 02/11/2017   Procedure: CORONARY STENT INTERVENTION;  Surgeon: Nelva Bush, MD;  Location: [place] [facility];  Service: Cardiovascular;  Laterality: N/A;  . FEMUR FRACTURE SURGERY Right 2006  . LEFT HEART CATH AND CORONARY ANGIOGRAPHY N/A 02/11/2017   Procedure: LEFT HEART CATH AND  CORONARY ANGIOGRAPHY;  Surgeon: Nelva Bush, MD;  Location: [place] [facility];  Service: Cardiovascular;  Laterality: N/A;  . OVARIAN CYST SURGERY    . RE-EXCISION OF BREAST LUMPECTOMY Left 04/01/2016   Procedure: RE-EXCISION OF LEFT BREAST MEDIAL MARGIN;  Surgeon: Autumn Messing III, MD;  Location: [place];  Service: General;  Laterality: Left;    Prior to Admission medications   Medication Sig Start Date End Date Taking? Authorizing Provider  aspirin (ASPIRIN EC) 81 MG EC tablet Take 81 mg by mouth daily. Swallow whole.     [provider]  atorvastatin (LIPITOR) 40 MG tablet Take 1 tablet (40 mg total) by mouth daily. 05/19/17   Minna Merritts, MD  Cholecalciferol (VITAMIN D) 2000 UNITS CAPS Take 1 capsule by mouth daily.      [provider]  clopidogrel (PLAVIX) 75 MG tablet Take 1 tablet (75 mg total) by mouth daily with breakfast. 08/05/17   Tower, [name], MD  cyanocobalamin (,VITAMIN B-12,) 1000 MCG/ML injection Inject 1,000 mcg into the muscle every 3 (three) months.    [provider]  isosorbide mononitrate (IMDUR) 30 MG 24 hr tablet TAKE ONE-HALF TABLET BY MOUTH EVERY DAY 09/16/17   Minna Merritts, MD  nitroGLYCERIN (NITROSTAT) 0.4 MG SL tablet Place 1 tablet (0.4 mg total) under the tongue every 5 (five) minutes x 3 doses as needed for chest pain. 02/13/17   Demetrios Loll, MD  omeprazole (PRILOSEC) 20 MG capsule Take 1 capsule (20 mg total) by mouth daily as needed (acid reflux). 08/05/17   Tower, [name], MD    Allergies Buspirone hcl; Codeine; Hydrochlorothiazide; Lansoprazole; Minocycline hcl; Penicillins; Phenergan [promethazine hcl]; Ramipril; and Sulfa antibiotics  Family History  Problem Relation Age of Onset  . Hypertension Mother   . Heart disease Father   . Macular degeneration Sister   . Diabetes Maternal Aunt   . Cancer Maternal Aunt        throat    Social History Social History   Tobacco Use  . Smoking status:  Former Smoker    Last attempt to quit: 03/11/1968    Years since quitting: 49.6  . Smokeless tobacco: Never Used  Substance Use Topics  . Alcohol use: No    Alcohol/week: 0.0 standard drinks  . Drug use: No    Review of Systems  Constitutional: Negative for fever. + syncope Eyes: Negative for visual changes. ENT: Negative for sore throat. Neck: No neck pain  Cardiovascular: Negative for chest pain. + chest burning Respiratory: Negative for shortness of breath. Gastrointestinal: Negative for abdominal pain, vomiting or diarrhea. Genitourinary: Negative for dysuria. Musculoskeletal: Negative for back pain. Skin: Negative for rash. Neurological: Negative for headaches, weakness or numbness. Psych: No SI or HI  ____________________________________________   PHYSICAL EXAM:  VITAL SIGNS: ED Triage Vitals  Enc Vitals Group     BP 10/16/17 1058 (!) 152/71     Pulse Rate 10/16/17 1058 71     Resp 10/16/17 1058 15     Temp 10/16/17 1058 97.6 F (36.4 C)  Temp Source 10/16/17 1058 Oral     SpO2 10/16/17 1058 97 %     Weight 10/16/17 1059 130 lb (59 kg)     Height 10/16/17 1059 5\' 1"  (1.549 m)     Head Circumference --      Peak Flow --      Pain Score 10/16/17 1059 0     Pain Loc --      Pain Edu? --      Excl. in [place]? --     Constitutional: Alert and oriented. Well appearing and in no apparent distress. HEENT:      Head: Normocephalic and atraumatic.         Eyes: Conjunctivae are normal. Sclera is non-icteric.       Mouth/Throat: Mucous membranes are moist.       Neck: Supple with no signs of meningismus. Cardiovascular: Regular rate and rhythm. No murmurs, gallops, or rubs. 2+ symmetrical distal pulses are present in all extremities. No JVD. Respiratory: Normal respiratory effort. Lungs are clear to auscultation bilaterally. No wheezes, crackles, or rhonchi.  Gastrointestinal: Soft, non tender, and non distended with positive bowel sounds. No rebound or  guarding. Musculoskeletal: Nontender with normal range of motion in all extremities. No edema, cyanosis, or erythema of extremities. Neurologic: Normal speech and language. A & O x3, PERRL, EOMI, no nystagmus, CN II-XII intact, motor testing reveals good tone and bulk throughout. There is no evidence of pronator drift or dysmetria. Muscle strength is 5/5 throughout. Sensory examination is intact. Gait deferred Skin: Skin is warm, dry and intact. No rash noted. Psychiatric: Mood and affect are normal. Speech and behavior are normal.  ____________________________________________   LABS (all labs ordered are listed, but only abnormal results are displayed)  Labs Reviewed  BASIC METABOLIC PANEL - Abnormal; Notable for the following components:      Result Value   Glucose, Bld 107 (*)    Calcium 8.5 (*)    GFR calc non Af Amer 60 (*)    All other components within normal limits  CBC - Abnormal; Notable for the following components:   Hemoglobin 10.2 (*)    HCT 31.0 (*)    MCV 77.4 (*)    MCH 25.4 (*)    RDW 18.2 (*)    All other components within normal limits  TROPONIN I - Abnormal; Notable for the following components:   Troponin I 2.48 (*)    All other components within normal limits  URINALYSIS, COMPLETE (UACMP) WITH MICROSCOPIC  CBG MONITORING, ED   ____________________________________________  EKG  ED ECG REPORT I, Rudene Re, the attending physician, personally viewed and interpreted this ECG.  Normal sinus rhythm, rate of 71, prolonged QTC, LVH, ST elevation in the anterior leads which is slightly worsened compared to prior.  12:11 - NSR, rate 79, long QTC, LVH, persistent ST elevations in anterior leads with no reciprocal changes.  Unchanged from initial. ____________________________________________  RADIOLOGY  none  ____________________________________________   PROCEDURES  Procedure(s) performed: None Procedures Critical Care performed:  yes  CRITICAL CARE Performed by: Rudene Re  ?  Total critical care time: 35 min  Critical care time was exclusive of separately billable procedures and treating other patients.  Critical care was necessary to treat or prevent imminent or life-threatening deterioration.  Critical care was time spent personally by me on the following activities: development of treatment plan with patient and/or surrogate as well as nursing, discussions with consultants, evaluation of patient's response to treatment, examination of  patient, obtaining history from patient or surrogate, ordering and performing treatments and interventions, ordering and review of laboratory studies, ordering and review of radiographic studies, pulse oximetry and re-evaluation of patient's condition.  ____________________________________________   INITIAL IMPRESSION / ASSESSMENT AND PLAN / ED COURSE  84 y.o. female with a history of CAD, NSTEMI, hypertension, hyperlipidemia who presents for evaluation of a syncopal event.  Patient arrives with no complaints, neurologically intact.  Initial EKG shows slightly ST elevation on anterior leads with no reciprocal changes which is present in prior EKG but slightly worse.  Patient is completely asymptomatic at this time with no chest pain or shortness of breath.  She did have an episode of burning sensation across her chest 2 nights ago which she describes as similar to her prior heart attack.  Her first troponin is elevated at 2.48 concerning for NSTEMI. Repeat EKG unchanged. Discussed with Dr. Saunders Revel , cardiology who agrees that EKG does not meet STEMI criteria.  He recommended started patient on heparin and he will consult in house.  Patient remains completely asymptomatic.  Will give ASA and admit to Hospitalist.       As part of my medical decision making, I reviewed the following data within the [place] notes reviewed and incorporated, Labs reviewed ,  EKG interpreted , Old EKG reviewed, Old chart reviewed, Radiograph reviewed , Discussed with admitting physician , Notes from prior ED visits and [place] Controlled Substance Database    Pertinent labs & imaging results that were available during my care of the patient were reviewed by me and considered in my medical decision making (see chart for details).    ____________________________________________   FINAL CLINICAL IMPRESSION(S) / ED DIAGNOSES  Final diagnoses:  NSTEMI (non-ST elevated myocardial infarction) ([place])  Syncope, unspecified syncope type      NEW MEDICATIONS STARTED DURING THIS VISIT:  ED Discharge Orders    None       Note:  This document was prepared using Dragon voice recognition software and may include unintentional dictation errors.    Alfred Levins, [place], MD 10/16/17 1225

## 2017-10-16 NOTE — Progress Notes (Signed)
Family Meeting Note  Advance Directive:yes  Today a meeting took place with the Patient.  Patient is able to participate    The following clinical team members were present during this meeting:MD  The following were discussed:Patient's diagnosis: MI, Patient's progosis: Unable to determine and Goals for treatment: Full Code  Additional follow-up to be provided: prn  Time spent during discussion:20 minutes  Gorden Harms, MD

## 2017-10-16 NOTE — H&P (Addendum)
[facility] [place] NAME: Jenna Young    MR#:  [id number]  DATE OF BIRTH:  [birth year]  DATE OF ADMISSION:  10/16/2017  PRIMARY CARE PHYSICIAN: Tower, [name], MD   REQUESTING/REFERRING PHYSICIAN:   CHIEF COMPLAINT:   Chief Complaint  Patient presents with  . Loss of Consciousness    HISTORY OF PRESENT ILLNESS: Jenna Young  is a 84 y.o. female with a known history per below presented to the emergency room after syncopal episode with collapse after having her hair done, patient complained of generalized weakness, nausea after the event, in the emergency room patient was noted to be hypotensive with systolic blood pressure in the 70s-given IV fluid bolus, patient did complain of chest pain 2 days ago was a burning sensation in bed radiated between both shoulders anteriorly, patient noted to be tachypneic, troponin was 2.4, ED attending did discuss case with cardiology/Dr.END whom recommended admission for further evaluation/care, patient evaluated emergency room, no apparent distress, denies chest pain or shortness of breath, patient now be admitted for acute non-STEMI, acute syncopal episode.  PAST MEDICAL HISTORY:   Past Medical History:  Diagnosis Date  . Allergic rhinitis   . Anemia   . Anxiety   . GERD (gastroesophageal reflux disease)   . History of radiation therapy 04/29/16- 05/24/16   Left Breast 40.05 Gy in 15 fractions, Left Breast boost 10 Gy in 5 fractions.   . Hyperlipidemia   . Hypertension    treated by Dr Thurnell Garbe recently with syncope episode  . Osteopenia   . Osteoporosis   . Personal history of radiation therapy 2018    PAST SURGICAL HISTORY:  Past Surgical History:  Procedure Laterality Date  . APPENDECTOMY    . BREAST BIOPSY Left 11/29/2008  . BREAST BIOPSY Left 01/26/2016   malignant  . BREAST EXCISIONAL BIOPSY Left 12/26/2008  . BREAST LUMPECTOMY Left 02/27/2016  . BREAST LUMPECTOMY Left 04/01/2016  .  BREAST LUMPECTOMY WITH RADIOACTIVE SEED LOCALIZATION Left 02/27/2016   Procedure: LEFT BREAST LUMPECTOMY WITH RADIOACTIVE SEED LOCALIZATION;  Surgeon: Autumn Messing III, MD;  Location: [facility];  Service: General;  Laterality: Left;  . BREAST SURGERY  10/10   benign biopsy  negative  . CESAREAN SECTION    . CHOLECYSTECTOMY    . CORONARY STENT INTERVENTION N/A 02/11/2017   Procedure: CORONARY STENT INTERVENTION;  Surgeon: Nelva Bush, MD;  Location: [facility];  Service: Cardiovascular;  Laterality: N/A;  . FEMUR FRACTURE SURGERY Right 2006  . LEFT HEART CATH AND CORONARY ANGIOGRAPHY N/A 02/11/2017   Procedure: LEFT HEART CATH AND CORONARY ANGIOGRAPHY;  Surgeon: Nelva Bush, MD;  Location: [facility];  Service: Cardiovascular;  Laterality: N/A;  . OVARIAN CYST SURGERY    . RE-EXCISION OF BREAST LUMPECTOMY Left 04/01/2016   Procedure: RE-EXCISION OF LEFT BREAST MEDIAL MARGIN;  Surgeon: Autumn Messing III, MD;  Location: [facility];  Service: General;  Laterality: Left;    SOCIAL HISTORY:  Social History   Tobacco Use  . Smoking status: Former Smoker    Last attempt to quit: 03/11/1968    Years since quitting: 49.6  . Smokeless tobacco: Never Used  Substance Use Topics  . Alcohol use: No    Alcohol/week: 0.0 standard drinks    FAMILY HISTORY:  Family History  Problem Relation Age of Onset  . Hypertension Mother   . Heart disease Father   . Macular degeneration Sister   .  Diabetes Maternal Aunt   . Cancer Maternal Aunt        throat    DRUG ALLERGIES:  Allergies  Allergen Reactions  . Buspirone Hcl     REACTION: HEART PALPITATIONS  . Codeine     REACTION: nausea and vomiting  . Hydrochlorothiazide     REACTION: syncope-possibly from dehydration  . Lansoprazole     REACTION: abd pain  . Minocycline Hcl   . Penicillins     REACTION: mouth numbness Has patient had a PCN reaction causing immediate rash, facial/tongue/throat  swelling, SOB or lightheadedness with hypotension: No Has patient had a PCN reaction causing severe rash involving mucus membranes or skin necrosis: No Has patient had a PCN reaction that required hospitalization: No Has patient had a PCN reaction occurring within the last 10 years: No If all of the above answers are "NO", then may proceed with Cephalosporin use.  Marland Kitchen Phenergan [Promethazine Hcl] Other (See Comments)    Body shakes, hallucinations  . Ramipril     Cough   . Sulfa Antibiotics Nausea Only    REVIEW OF SYSTEMS:   CONSTITUTIONAL: No fever, fatigue or weakness.  EYES: No blurred or double vision.  EARS, NOSE, AND THROAT: No tinnitus or ear pain.  RESPIRATORY: No cough, shortness of breath, wheezing or hemoptysis.  CARDIOVASCULAR: No chest pain, orthopnea, edema.  GASTROINTESTINAL: No nausea, vomiting, diarrhea or abdominal pain.  GENITOURINARY: No dysuria, hematuria.  ENDOCRINE: No polyuria, nocturia,  HEMATOLOGY: No anemia, easy bruising or bleeding SKIN: No rash or lesion. MUSCULOSKELETAL: No joint pain or arthritis.   NEUROLOGIC: No tingling, numbness, weakness.  PSYCHIATRY: No anxiety or depression.   MEDICATIONS AT HOME:  Prior to Admission medications   Medication Sig Start Date End Date Taking? Authorizing Provider  aspirin (ASPIRIN EC) 81 MG EC tablet Take 81 mg by mouth daily. Swallow whole.    Yes [provider]  atorvastatin (LIPITOR) 40 MG tablet Take 1 tablet (40 mg total) by mouth daily. 05/19/17  Yes Minna Merritts, MD  Cholecalciferol (VITAMIN D) 2000 UNITS CAPS Take 1 capsule by mouth daily.     Yes [provider]  clopidogrel (PLAVIX) 75 MG tablet Take 1 tablet (75 mg total) by mouth daily with breakfast. 08/05/17  Yes Tower, [name], MD  cyanocobalamin (,VITAMIN B-12,) 1000 MCG/ML injection Inject 1,000 mcg into the muscle every 3 (three) months.   Yes [provider]  isosorbide mononitrate (IMDUR) 30 MG 24 hr tablet TAKE  ONE-HALF TABLET BY MOUTH EVERY DAY 09/16/17  Yes Gollan, Kathlene November, MD  omeprazole (PRILOSEC) 20 MG capsule Take 1 capsule (20 mg total) by mouth daily as needed (acid reflux). 08/05/17  Yes Tower, [name], MD  nitroGLYCERIN (NITROSTAT) 0.4 MG SL tablet Place 1 tablet (0.4 mg total) under the tongue every 5 (five) minutes x 3 doses as needed for chest pain. 02/13/17   Demetrios Loll, MD      PHYSICAL EXAMINATION:   VITAL SIGNS: Blood pressure (!) 143/63, pulse 76, temperature 97.6 F (36.4 C), temperature source Oral, resp. rate 14, height 5\' 1"  (1.549 m), weight 59 kg, SpO2 99 %.  GENERAL:  84 y.o.-year-old patient lying in the bed with no acute distress.  Frail-appearing EYES: Pupils equal, round, reactive to light and accommodation. No scleral icterus. Extraocular muscles intact.  HEENT: Head atraumatic, normocephalic. Oropharynx and nasopharynx clear.  NECK:  Supple, no jugular venous distention. No thyroid enlargement, no tenderness.  LUNGS: Normal breath sounds bilaterally,  no wheezing, rales,rhonchi or crepitation. No use of accessory muscles of respiration.  CARDIOVASCULAR: S1, S2 normal. SEM, no rubs, or gallops.  ABDOMEN: Soft, nontender, nondistended. Bowel sounds present. No organomegaly or mass.  EXTREMITIES: No pedal edema, cyanosis, or clubbing.  NEUROLOGIC: Cranial nerves II through XII are intact. Muscle strength 5/5 in all extremities. Sensation intact. Gait not checked.  PSYCHIATRIC: The patient is alert and oriented x 3.  SKIN: No obvious rash, lesion, or ulcer.   LABORATORY PANEL:   CBC Recent Labs  Lab 10/16/17 1115  WBC 7.4  HGB 10.2*  HCT 31.0*  PLT 214  MCV 77.4*  MCH 25.4*  MCHC 32.8  RDW 18.2*   ------------------------------------------------------------------------------------------------------------------  Chemistries  Recent Labs  Lab 10/16/17 1115  NA 137  K 4.2  CL 108  CO2 23  GLUCOSE 107*  BUN 12  CREATININE 0.87  CALCIUM 8.5*    ------------------------------------------------------------------------------------------------------------------ estimated creatinine clearance is 40.5 mL/min (by C-G formula based on SCr of 0.87 mg/dL). ------------------------------------------------------------------------------------------------------------------ No results for input(s): TSH, T4TOTAL, T3FREE, THYROIDAB in the last 72 hours.  Invalid input(s): FREET3   Coagulation profile Recent Labs  Lab 10/16/17 1227  INR 1.10   ------------------------------------------------------------------------------------------------------------------- No results for input(s): DDIMER in the last 72 hours. -------------------------------------------------------------------------------------------------------------------  Cardiac Enzymes Recent Labs  Lab 10/16/17 1115  TROPONINI 2.48*   ------------------------------------------------------------------------------------------------------------------ Invalid input(s): POCBNP  ---------------------------------------------------------------------------------------------------------------  Urinalysis    Component Value Date/Time   COLORURINE STRAW (A) 02/28/2017 1843   APPEARANCEUR CLEAR 02/28/2017 1843   LABSPEC 1.004 (L) 02/28/2017 1843   PHURINE 7.0 02/28/2017 1843   GLUCOSEU NEGATIVE 02/28/2017 1843   HGBUR NEGATIVE 02/28/2017 1843   BILIRUBINUR NEGATIVE 02/28/2017 1843   BILIRUBINUR 2+ 03/18/2016 [place] 02/28/2017 1843   PROTEINUR NEGATIVE 02/28/2017 1843   UROBILINOGEN 4.0 03/18/2016 1447   UROBILINOGEN 0.2 11/13/2008 2332   NITRITE NEGATIVE 02/28/2017 1843   LEUKOCYTESUR TRACE (A) 02/28/2017 1843     RADIOLOGY: No results found.  EKG: Orders placed or performed during the hospital encounter of 10/16/17  . ED EKG  . ED EKG  . EKG 12-Lead  . EKG 12-Lead  . EKG 12-Lead  . EKG 12-Lead  . EKG 12-Lead  . EKG 12-Lead    IMPRESSION AND  PLAN: *Acute non-STEMI *Acute syncopal episode with collapse *Chronic benign essential hypertension *Chronic hyperlipidemia, unspecified *Chronic GERD  Admit to regular nursing for bed on our ACS protocol, heparin drip, continue aspirin, Plavix, M. Doerr, nitroglycerin as needed, IV Solu-Medrol PRN breakthrough pain, check lipids in the morning, cardiology to see, check echocardiogram, carotid Dopplers, fall precautions, Lopressor, noted allergy to ACE inhibitors, and continue close medical monitoring     All the records are reviewed and case discussed with ED provider. Management plans discussed with the patient, family and they are in agreement.  CODE STATUS:full Code Status History    Date Active Date Inactive Code Status Order ID Comments User Context   02/28/2017 2308 03/02/2017 2023 Full Code [id number]  Phillips Grout, MD Inpatient   02/10/2017 1414 02/13/2017 1742 Full Code [id number]  Nicholes Mango, MD ED       TOTAL TIME TAKING CARE OF THIS PATIENT: 45 minutes.    Avel Peace Kathyjo Briere M.D on 10/16/2017   Between 7am to 6pm - Pager - [phone]  After 6pm go to [email or web address] - password [email or web address]  Office  [phone]  CC: Primary care physician; Tower, [name], MD   Note: This dictation  was prepared with Dragon dictation along with smaller phrase technology. Any transcriptional errors that result from this process are unintentional.

## 2017-10-16 NOTE — Progress Notes (Signed)
[facility] for heparin Indication: chest pain/ACS  Allergies  Allergen Reactions  . Buspirone Hcl     REACTION: HEART PALPITATIONS  . Codeine     REACTION: nausea and vomiting  . Hydrochlorothiazide     REACTION: syncope-possibly from dehydration  . Lansoprazole     REACTION: abd pain  . Minocycline Hcl   . Penicillins     REACTION: mouth numbness Has patient had a PCN reaction causing immediate rash, facial/tongue/throat swelling, SOB or lightheadedness with hypotension: No Has patient had a PCN reaction causing severe rash involving mucus membranes or skin necrosis: No Has patient had a PCN reaction that required hospitalization: No Has patient had a PCN reaction occurring within the last 10 years: No If all of the above answers are "NO", then may proceed with Cephalosporin use.  Marland Kitchen Phenergan [Promethazine Hcl] Other (See Comments)    Body shakes, hallucinations  . Ramipril     Cough   . Sulfa Antibiotics Nausea Only    Patient Measurements: Height: 5\' 1"  (154.9 cm) Weight: 130 lb (59 kg) IBW/kg (Calculated) : 47.8  Vital Signs: Temp: 97.6 F (36.4 C) (08/08 1058) Temp Source: Oral (08/08 1058) BP: 143/63 (08/08 1130) Pulse Rate: 76 (08/08 1130)  Labs: Recent Labs    10/16/17 1115 10/16/17 1227  HGB 10.2*  --   HCT 31.0*  --   PLT 214  --   APTT  --  <24*  LABPROT  --  14.1  INR  --  1.10  CREATININE 0.87  --   TROPONINI 2.48*  --     Estimated Creatinine Clearance: 40.5 mL/min (by C-G formula based on SCr of 0.87 mg/dL).   Medical History: Past Medical History:  Diagnosis Date  . Allergic rhinitis   . Anemia   . Anxiety   . GERD (gastroesophageal reflux disease)   . History of radiation therapy 04/29/16- 05/24/16   Left Breast 40.05 Gy in 15 fractions, Left Breast boost 10 Gy in 5 fractions.   . Hyperlipidemia   . Hypertension    treated by Dr Thurnell Garbe recently with syncope episode  . Osteopenia   .  Osteoporosis   . Personal history of radiation therapy 2018    Assessment: 84 y.o. female with a history of CAD, NSTEMI, hypertension, hyperlipidemia who presents for evaluation of a syncopal event with chest pain and troponin 2.48ng/mL. She is on no anticoagulants PTA. Baseline labs have been ordered but not yet resulted.  Goal of Therapy:  Heparin level 0.3-0.7 units/ml Monitor platelets by anticoagulation protocol: Yes   Plan:  Give 3500 units bolus x 1 Start heparin infusion at 750 units/hr Check anti-Xa level in 8 hours and daily while on heparin Continue to monitor H&H and platelets  Dallie Piles, PharmD 10/16/2017,1:11 PM

## 2017-10-16 NOTE — ED Notes (Signed)
Pt states she feels nauseous when moving but denies and emesis. Pt states she took meclizine earlier today.

## 2017-10-16 NOTE — Consult Note (Signed)
Cardiology Consultation:   Patient ID: Jenna Young; [id number]; 04-Sep-1931   Admit date: 10/16/2017 Date of Consult: 10/16/2017  Primary Care Provider: Abner Greenspan, MD Primary Cardiologist: Ida Rogue, MD  Primary Electrophysiologist: None   Patient Profile:   Jenna Young is a 84 y.o. female with a hx of coronary artery disease status post NSTEMI in 02/2017 with unsuccessful PCI to heavily calcified mid RCA stenosis, hypertension, and near syncope/syncope who is being seen today for the evaluation of chest pain, syncope, and elevated troponin at the request of Dr. Jerelyn Charles.  History of Present Illness:   Jenna Young was in her usual state of health until approximately 2 days ago when she had sudden onset of vague discomfort along her upper chest.  She does not describe it at pain nor does she endorse any associated symptoms.  The discomfort lasted about 30 minutes at which time she took a sublingual nitroglycerin.  The pain promptly resolved and has not recurred.  She felt well yesterday.  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  At the Marian Mene[place] of this visit, she stood up to pay and suddenly felt as though she would pass out.  She was able to sit down in a chair and believes that she passed out for about 30 seconds.  EMS was summoned and reportedly found her blood pressure to be quite low with systolic pressures in the 70s.  She received IV fluids and was transported to the emergency department.  At this time, Jenna Young reports feeling back to baseline.  She has not had any further chest pain since her episode 2 days ago.  She also denies palpitations, shortness of breath, orthopnea, PND, and edema.  She has been compliant with her medications.  She notes having had near syncopal and syncopal episodes in the remote past, though these had resolved after medication adjustments were made by Dr. Rockey Situ.  Past Medical  History:  Diagnosis Date  . Allergic rhinitis   . Anemia   . Anxiety   . GERD (gastroesophageal reflux disease)   . History of radiation therapy 04/29/16- 05/24/16   Left Breast 40.05 Gy in 15 fractions, Left Breast boost 10 Gy in 5 fractions.   . Hyperlipidemia   . Hypertension    treated by Dr Thurnell Garbe recently with syncope episode  . Osteopenia   . Osteoporosis   . Personal history of radiation therapy 2018    Past Surgical History:  Procedure Laterality Date  . APPENDECTOMY    . BREAST BIOPSY Left 11/29/2008  . BREAST BIOPSY Left 01/26/2016   malignant  . BREAST EXCISIONAL BIOPSY Left 12/26/2008  . BREAST LUMPECTOMY Left 02/27/2016  . BREAST LUMPECTOMY Left 04/01/2016  . BREAST LUMPECTOMY WITH RADIOACTIVE SEED LOCALIZATION Left 02/27/2016   Procedure: LEFT BREAST LUMPECTOMY WITH RADIOACTIVE SEED LOCALIZATION;  Surgeon: Autumn Messing III, MD;  Location: [facility];  Service: General;  Laterality: Left;  . BREAST SURGERY  10/10   benign biopsy  negative  . CESAREAN SECTION    . CHOLECYSTECTOMY    . CORONARY STENT INTERVENTION N/A 02/11/2017   Procedure: CORONARY STENT INTERVENTION;  Surgeon: Nelva Bush, MD;  Location: [facility];  Service: Cardiovascular;  Laterality: N/A;  . FEMUR FRACTURE SURGERY Right 2006  . LEFT HEART CATH AND CORONARY ANGIOGRAPHY N/A 02/11/2017   Procedure: LEFT HEART CATH AND CORONARY ANGIOGRAPHY;  Surgeon: Nelva Bush, MD;  Location: [facility];  Service: Cardiovascular;  Laterality: N/A;  . OVARIAN CYST SURGERY    . RE-EXCISION OF BREAST LUMPECTOMY Left 04/01/2016   Procedure: RE-EXCISION OF LEFT BREAST MEDIAL MARGIN;  Surgeon: Autumn Messing III, MD;  Location: [facility];  Service: General;  Laterality: Left;     Home Medications:  Prior to Admission medications   Medication Sig Start Date Lerone Onder Date Taking? Authorizing Provider  aspirin (ASPIRIN EC) 81 MG EC tablet Take 81 mg by mouth daily.  Swallow whole.    Yes [provider]  atorvastatin (LIPITOR) 40 MG tablet Take 1 tablet (40 mg total) by mouth daily. 05/19/17  Yes Minna Merritts, MD  Cholecalciferol (VITAMIN D) 2000 UNITS CAPS Take 1 capsule by mouth daily.     Yes [provider]  clopidogrel (PLAVIX) 75 MG tablet Take 1 tablet (75 mg total) by mouth daily with breakfast. 08/05/17  Yes Tower, [name], MD  cyanocobalamin (,VITAMIN B-12,) 1000 MCG/ML injection Inject 1,000 mcg into the muscle every 3 (three) months.   Yes [provider]  isosorbide mononitrate (IMDUR) 30 MG 24 hr tablet TAKE ONE-HALF TABLET BY MOUTH EVERY DAY 09/16/17  Yes Gollan, Kathlene November, MD  omeprazole (PRILOSEC) 20 MG capsule Take 1 capsule (20 mg total) by mouth daily as needed (acid reflux). 08/05/17  Yes Tower, [name], MD  nitroGLYCERIN (NITROSTAT) 0.4 MG SL tablet Place 1 tablet (0.4 mg total) under the tongue every 5 (five) minutes x 3 doses as needed for chest pain. 02/13/17   Demetrios Loll, MD    Inpatient Medications: Scheduled Meds: . [START ON 10/17/2017] aspirin EC  81 mg Oral Daily  . atorvastatin  40 mg Oral q1800  . [START ON 10/17/2017] cholecalciferol  2,000 Units Oral Daily  . [START ON 10/17/2017] clopidogrel  75 mg Oral Q breakfast  . [START ON 10/17/2017] isosorbide mononitrate  15 mg Oral Daily  . metoprolol tartrate  25 mg Oral BID  . [START ON 10/17/2017] pantoprazole  40 mg Oral Daily  . sodium chloride flush  3 mL Intravenous Q12H   Continuous Infusions: . sodium chloride    . famotidine (PEPCID) IV    . heparin 750 Units/hr (10/16/17 1259)   PRN Meds: sodium chloride, acetaminophen, ALPRAZolam, morphine injection, nitroGLYCERIN, ondansetron (ZOFRAN) IV, sodium chloride flush  Allergies:    Allergies  Allergen Reactions  . Buspirone Hcl     REACTION: HEART PALPITATIONS  . Codeine     REACTION: nausea and vomiting  . Hydrochlorothiazide     REACTION: syncope-possibly from dehydration  . Lansoprazole       REACTION: abd pain  . Minocycline Hcl   . Penicillins     REACTION: mouth numbness Has patient had a PCN reaction causing immediate rash, facial/tongue/throat swelling, SOB or lightheadedness with hypotension: No Has patient had a PCN reaction causing severe rash involving mucus membranes or skin necrosis: No Has patient had a PCN reaction that required hospitalization: No Has patient had a PCN reaction occurring within the last 10 years: No If all of the above answers are "NO", then may proceed with Cephalosporin use.  Marland Kitchen Phenergan [Promethazine Hcl] Other (See Comments)    Body shakes, hallucinations  . Ramipril     Cough   . Sulfa Antibiotics Nausea Only    Social History:   Social History   Socioeconomic History  . Marital status: Widowed    Spouse name: Not on file  . Number of children: 1  .  Years of education: Not on file  . Highest education level: Not on file  Occupational History    Employer: RETIRED  Social Needs  . Financial resource strain: Not on file  . Food insecurity:    Worry: Not on file    Inability: Not on file  . Transportation needs:    Medical: Not on file    Non-medical: Not on file  Tobacco Use  . Smoking status: Former Smoker    Last attempt to quit: 03/11/1968    Years since quitting: 49.6  . Smokeless tobacco: Never Used  Substance and Sexual Activity  . Alcohol use: No    Alcohol/week: 0.0 standard drinks  . Drug use: No  . Sexual activity: Never  Lifestyle  . Physical activity:    Days per week: Not on file    Minutes per session: Not on file  . Stress: Not on file  Relationships  . Social connections:    Talks on phone: Not on file    Gets together: Not on file    Attends religious service: Not on file    Active member of club or organization: Not on file    Attends meetings of clubs or organizations: Not on file    Relationship status: Not on file  . Intimate partner violence:    Fear of current or ex partner: Not on file     Emotionally abused: Not on file    Physically abused: Not on file    Forced sexual activity: Not on file  Other Topics Concern  . Not on file  Social History Narrative  . Not on file    Family History:   Family History  Problem Relation Age of Onset  . Hypertension Mother   . Heart disease Father   . Macular degeneration Sister   . Diabetes Maternal Aunt   . Cancer Maternal Aunt        throat     ROS:  Please see the history of present illness. All other ROS reviewed and negative.     Physical Exam/Data:   Vitals:   10/16/17 1115 10/16/17 1130 10/16/17 1456 10/16/17 1457  BP: 123/78 (!) 143/63  (!) 160/66  Pulse: 82 76  88  Resp: (!) 24 14  18   Temp:    98.6 F (37 C)  TempSrc:    Oral  SpO2: 97% 99%  98%  Weight:   59.1 kg   Height:   5\' 1"  (1.549 m)     Intake/Output Summary (Last 24 hours) at 10/16/2017 1640 Last data filed at 10/16/2017 1500 Gross per 24 hour  Intake 0 ml  Output 0 ml  Net 0 ml   Filed Weights   10/16/17 1059 10/16/17 1456  Weight: 59 kg 59.1 kg   Body mass index is 24.64 kg/m.  General:  Well nourished, well developed, in no acute distress. HEENT: No conjunctival pallor or scleral icterus. Lymph: No cervical or supraclavicular lymphadenopathy. Neck: No JVD or HJR. Endocrine:  No thryomegaly Vascular: No carotid bruits; radial and pedal pulses are 2+ bilaterally. Cardiac: Regular rate and rhythm with 2/6 systolic murmur heard loudest at the apex.  No rubs or gallops. Lungs: Normal work of breathing.  No wheezes or crackles. Abd: Bowel sounds present.  Soft, nontender, nondistended without hepatosplenomegaly. Ext: No lower extremity edema. Musculoskeletal:  No deformities, BUE and BLE strength normal and equal Skin: Warm and dry without rash. Neuro:  CNs 3-12 intact, no focal abnormalities noted Psych:  Normal affect   EKG:  The EKG was personally reviewed and demonstrates: Normal sinus rhythm with LVH and subtle ST elevation in V1  through V3.  EKG does not meet STEMI criteria, though anteroseptal ST elevation is more pronounced than on prior tracings. Telemetry:  Telemetry was personally reviewed and demonstrates: Normal sinus rhythm.  Relevant CV Studies: Echocardiogram (03/01/2017) - Left ventricle: The cavity size was normal. Wall thickness was   increased in a pattern of mild LVH. Systolic function was normal.   The estimated ejection fraction was in the range of 55% to 60%.   There is hypokinesis of the inferolateral myocardium. Doppler   parameters are consistent with abnormal left ventricular   relaxation (grade 1 diastolic dysfunction). - Aortic valve: There was mild regurgitation. - Mitral valve: Calcified annulus. There was mild regurgitation.  LHC (02/11/2017): Conclusions: 1. Significant mid RCA disease with sequential, calcified 80 and 95% stenoses. Element of thrombus is likely also present. 2. 90% ostial stenosis involving small OM1 branch. 3. Mildly reduced left ventricular contraction with inferior hypokinesis (LVEF ~45%). 4. Mildly elevated left ventricular filling pressure. 5. Unsuccessful PCI to mid RCA due to inability to cross the mid RCA stenosis despite aggressive buddy wire support.  Diagnostic Diagram       Laboratory Data:  Chemistry Recent Labs  Lab 10/16/17 1115  NA 137  K 4.2  CL 108  CO2 23  GLUCOSE 107*  BUN 12  CREATININE 0.87  CALCIUM 8.5*  GFRNONAA 60*  GFRAA >60  ANIONGAP 6    No results for input(s): PROT, ALBUMIN, AST, ALT, ALKPHOS, BILITOT in the last 168 hours. Hematology Recent Labs  Lab 10/16/17 1115  WBC 7.4  RBC 4.01  HGB 10.2*  HCT 31.0*  MCV 77.4*  MCH 25.4*  MCHC 32.8  RDW 18.2*  PLT 214   Cardiac Enzymes Recent Labs  Lab 10/16/17 1115 10/16/17 1503  TROPONINI 2.48* 1.80*   No results for input(s): TROPIPOC in the last 168 hours.  BNPNo results for input(s): BNP, PROBNP in the last 168 hours.  DDimer No results for input(s):  DDIMER in the last 168 hours.  Radiology/Studies:  No results found.  Assessment and Plan:   NSTEMI Patient presents today following syncopal episode that was preceded by a 30-minute episode of vague chest discomfort that occurred 2 days ago.  Initial troponin was elevated at 2.5, with follow-up troponin IV hours later downtrending to 1.8.  I suspect that she may have had some myocardial ischemia in the setting of her chest pain.  EKG demonstrates LVH with subtle anteroseptal ST elevation.  Given lack of ongoing chest pain, downtrending troponin, and EKG changes that do not meet STEMI criteria and may be reflective of underlying LVH with abnormal repolarization, I recommend deferring emergent cardiac catheterization.  Continue aspirin and clopidogrel.  Heparin infusion for at least 48 hours.  Obtain transthoracic echocardiogram.  Obtain one more troponin.  If still downtrending, no need to check further troponins unless patient has recurrent symptoms.  If chest pain recurs or significant troponin bump develops, I would recommend repeating cardiac catheterization.  Otherwise, I would favor deferring catheterization during this admission and considering transfer or outpatient procedure at tertiary care center to allow for atherectomy of heavily calcified RCA disease that could not be crossed with a balloon during attempted PCI in 02/2017.  Syncope This could have been due to a number of factors including vagal episode, dehydration, or arrhythmia.  At this time, the patient is  asymptomatic with normal sinus rhythm and elevated blood pressure.  Check orthostatics.  If orthostatic hypotension is evident, consider holding isosorbide mononitrate and/or metoprolol.  Obtain transthoracic echocardiogram.  Monitor on telemetry.  If unrevealing, outpatient event monitoring may be necessary.  Hypertension  Blood pressure modestly elevated at this time.  In the setting of syncope with reported  hypotension, I would not escalate her current therapy and would need to consider de-escalation if there is evidence of orthostatic blood pressure drop.  Hyperlipidemia Goal LDL less than 70.  Continue atorvastatin 40 mg daily.  Microcytic anemia Chronic and stable since January.  Consider iron studies; will defer to medicine service.  For questions or updates, please contact [place] Please consult [email or web address] for contact info under [facility].   Signed, Nelva Bush, MD  10/16/2017 4:40 PM

## 2017-10-16 NOTE — Progress Notes (Signed)
[place] for heparin Indication: chest pain/ACS  Allergies  Allergen Reactions  . Buspirone Hcl     REACTION: HEART PALPITATIONS  . Codeine     REACTION: nausea and vomiting  . Hydrochlorothiazide     REACTION: syncope-possibly from dehydration  . Lansoprazole     REACTION: abd pain  . Minocycline Hcl   . Penicillins     REACTION: mouth numbness Has patient had a PCN reaction causing immediate rash, facial/tongue/throat swelling, SOB or lightheadedness with hypotension: No Has patient had a PCN reaction causing severe rash involving mucus membranes or skin necrosis: No Has patient had a PCN reaction that required hospitalization: No Has patient had a PCN reaction occurring within the last 10 years: No If all of the above answers are "NO", then may proceed with Cephalosporin use.  Marland Kitchen Phenergan [Promethazine Hcl] Other (See Comments)    Body shakes, hallucinations  . Ramipril     Cough   . Sulfa Antibiotics Nausea Only    Patient Measurements: Height: 5\' 1"  (154.9 cm) Weight: 130 lb 6.4 oz (59.1 kg) IBW/kg (Calculated) : 47.8  Vital Signs: Temp: 97.7 F (36.5 C) (08/08 2005) Temp Source: Oral (08/08 2005) BP: 131/71 (08/08 2005) Pulse Rate: 63 (08/08 2005)  Labs: Recent Labs    10/16/17 1115 10/16/17 1227 10/16/17 1503 10/16/17 2048  HGB 10.2*  --   --   --   HCT 31.0*  --   --   --   PLT 214  --   --   --   APTT  --  <24*  --   --   LABPROT  --  14.1  --   --   INR  --  1.10  --   --   HEPARINUNFRC  --   --   --  0.48  CREATININE 0.87  --   --   --   TROPONINI 2.48*  --  1.80* 2.53*    Estimated Creatinine Clearance: 40.5 mL/min (by C-G formula based on SCr of 0.87 mg/dL).   Medical History: Past Medical History:  Diagnosis Date  . Allergic rhinitis   . Anemia   . Anxiety   . GERD (gastroesophageal reflux disease)   . History of radiation therapy 04/29/16- 05/24/16   Left Breast 40.05 Gy in 15 fractions, Left  Breast boost 10 Gy in 5 fractions.   . Hyperlipidemia   . Hypertension    treated by Dr Thurnell Garbe recently with syncope episode  . Osteopenia   . Osteoporosis   . Personal history of radiation therapy 2018    Assessment: 84 y.o. female with a history of CAD, NSTEMI, hypertension, hyperlipidemia who presents for evaluation of a syncopal event with chest pain and troponin 2.48ng/mL. She is on no anticoagulants PTA. Baseline labs have been ordered but not yet resulted.  Goal of Therapy:  Heparin level 0.3-0.7 units/ml Monitor platelets by anticoagulation protocol: Yes   Plan:  8/8:  HL @ 20:48 = 0.48 Will continue pt on current rate and draw confirmation level in 8 hrs on 8/9 @ 0500.  Kabella Cassidy D, PharmD 10/16/2017,9:38 PM

## 2017-10-16 NOTE — ED Triage Notes (Signed)
[facility] after witness loss of consciousness. Pt did not hit her head any denies any injury. Pt was sitting in chair when she felt "funny." Pt unable to describe feeling. Pt states she has felt weak over the last few days. Upon arrival to ED pt alert and oriented x 4 and denies any pain.EMS reports blood sugar finding of 137 and a bp reading of 74/43 and administered 500 cc of NS prior to arrival.

## 2017-10-17 ENCOUNTER — Inpatient Hospital Stay (HOSPITAL_COMMUNITY)
Admit: 2017-10-17 | Discharge: 2017-10-17 | Disposition: A | Discharge status: Home / Self Care | Payer: Medicare Other | Attending: Family Medicine | Admitting: Family Medicine

## 2017-10-17 DIAGNOSIS — I340 Nonrheumatic mitral (valve) insufficiency: Secondary | ICD-10-CM

## 2017-10-17 DIAGNOSIS — E440 Moderate protein-calorie malnutrition: Secondary | ICD-10-CM

## 2017-10-17 LAB — TROPONIN I: Troponin I: 2.66 ng/mL (ref <0.03)

## 2017-10-17 LAB — LIPID PANEL
Cholesterol: 121 mg/dL (ref 0–200)
HDL: 53 mg/dL (ref >40)
LDL Cholesterol: 51 mg/dL (ref 0–99)
Total CHOL/HDL Ratio: 2.3 RATIO
Triglycerides: 83 mg/dL (ref <150)
VLDL: 17 mg/dL (ref 0–40)

## 2017-10-17 LAB — HEPARIN LEVEL (UNFRACTIONATED): Heparin Unfractionated: 0.47 IU/mL (ref 0.30–0.70)

## 2017-10-17 LAB — CBC
HCT: 29.4 % — ABNORMAL LOW (ref 35.0–47.0)
Hemoglobin: 9.8 g/dL — ABNORMAL LOW (ref 12.0–16.0)
MCH: 25.7 pg — ABNORMAL LOW (ref 26.0–34.0)
MCHC: 33.2 g/dL (ref 32.0–36.0)
MCV: 77.2 fL — ABNORMAL LOW (ref 80.0–100.0)
Platelets: 204 10*3/uL (ref 150–440)
RBC: 3.8 MIL/uL (ref 3.80–5.20)
RDW: 18.5 % — ABNORMAL HIGH (ref 11.5–14.5)
WBC: 6.2 10*3/uL (ref 3.6–11.0)

## 2017-10-17 LAB — FIBRIN DERIVATIVES D-DIMER (ARMC ONLY): Fibrin derivatives D-dimer (ARMC): 294.07 ng/mL (FEU) (ref 0.00–499.00)

## 2017-10-17 LAB — ECHOCARDIOGRAM COMPLETE
Height: 61 in
Weight: 2046.4 oz

## 2017-10-17 MED ORDER — ENSURE ENLIVE PO LIQD
237.0000 mL | Freq: Two times a day (BID) | ORAL | Status: DC
  Administered 2017-10-17 – 2017-10-18 (×3): 237 mL via ORAL

## 2017-10-17 MED ORDER — ADULT MULTIVITAMIN W/MINERALS CH
1.0000 | ORAL_TABLET | Freq: Every day | ORAL | Status: DC
  Administered 2017-10-17 – 2017-10-18 (×2): 1 via ORAL
  Filled 2017-10-17 (×2): qty 1

## 2017-10-17 MED ORDER — FAMOTIDINE 20 MG PO TABS
20.0000 mg | ORAL_TABLET | Freq: Every day | ORAL | Status: DC
  Administered 2017-10-17 – 2017-10-18 (×2): 20 mg via ORAL
  Filled 2017-10-17 (×2): qty 1

## 2017-10-17 NOTE — Evaluation (Signed)
Physical Therapy Evaluation Patient Details Name: Jenna Young MRN: [id number] DOB: [birth year] Today's Date: 10/17/2017   History of Present Illness  Pt is a 84 y.o. female presents to the hospital diagnosed with NSTEMI. pt c/c is syncopal episode with collapse with generalized weakness and nausea. Pt PMH includes NSTEMI 02/2017 with CAD, HTN, hyperlipidemia. In ER pt was hypotensive with sytolic BP in 72'C. Pt had d-dimer test preformed while in hospital that was negative for PE.  Clinical Impression  Pt is a pleasant 84 year old female who was admitted for NSTEMI with PMH listed above. Pt performs bed mobility with Mod I, transfers sit<>stand with supervision, and ambulates 84' with CGA see below for details. PT monitored HR throughout session and noted WFL HR changes with activity. Pt repeatedly denied any dizziness or lightheadedness during session and was asymptomatic throughout. Pt demonstrates little deficits with functional mobility and is likely near baseline. Thus PT is recommending d/c to home with no followed up PT, but PT recommends pt continue cardiac rehab.    Follow Up Recommendations No PT follow up(continue with cardiac rehab)    Equipment Recommendations  None recommended by PT    Recommendations for Other Services       Precautions / Restrictions Precautions Precautions: Fall Restrictions Weight Bearing Restrictions: No      Mobility  Bed Mobility Overal bed mobility: Needs Assistance Bed Mobility: Supine to Sit     Supine to sit: Modified independent (Device/Increase time)     General bed mobility comments: Pt utilized elevated head rest  Transfers Overall transfer level: Needs assistance Equipment used: None Transfers: Sit to/from Stand Sit to Stand: Supervision         General transfer comment: PT supervision for safety as well as increased time. Pt states sit<>stand was baseline  Ambulation/Gait Ambulation/Gait assistance: Min guard Gait  Distance (Feet): 50 Feet Assistive device: None Gait Pattern/deviations: Step-through pattern Gait velocity: 10' in 8 seconds Gait velocity interpretation: <1.31 ft/sec, indicative of household ambulator General Gait Details: Pt was able to ambulate 92' with CGA for safety and progressed to supervison.   Stairs            Wheelchair Mobility    Modified Rankin (Stroke Patients Only)       Balance Overall balance assessment: Needs assistance Sitting-balance support: No upper extremity supported;Feet supported Sitting balance-Leahy Scale: Good     Standing balance support: No upper extremity supported Standing balance-Leahy Scale: Good                               Pertinent Vitals/Pain Pain Assessment: No/denies pain    Home Living Family/patient expects to be discharged to:: Private residence Living Arrangements: Other relatives(grandchildren) Available Help at Discharge: Family;Available PRN/intermittently Type of Home: House Home Access: Stairs to enter Entrance Stairs-Rails: Right(can reach door frame as well) Entrance Stairs-Number of Steps: 2 Home Layout: One level Home Equipment: Cane - single point      Prior Function Level of Independence: Independent         Comments: Pt states indepence with all ADLs and no assistance from grandchildren living in her house     Hand Dominance   Dominant Hand: Right    Extremity/Trunk Assessment   Upper Extremity Assessment Upper Extremity Assessment: Overall WFL for tasks assessed    Lower Extremity Assessment Lower Extremity Assessment: Overall WFL for tasks assessed       Communication  Communication: No difficulties  Cognition Arousal/Alertness: Awake/alert Behavior During Therapy: WFL for tasks assessed/performed Overall Cognitive Status: Within Functional Limits for tasks assessed                                        General Comments      Exercises      Assessment/Plan    PT Assessment Patient needs continued PT services  PT Problem List Decreased range of motion;Decreased strength;Decreased activity tolerance;Decreased balance;Decreased mobility;Decreased coordination;Decreased cognition;Decreased knowledge of use of DME;Decreased safety awareness       PT Treatment Interventions DME instruction;Gait training;Stair training;Functional mobility training;Therapeutic activities;Therapeutic exercise;Balance training;Patient/family education    PT Goals (Current goals can be found in the Care Plan section)  Acute Rehab PT Goals Patient Stated Goal: go home PT Goal Formulation: With patient Time For Goal Achievement: 10/31/17 Potential to Achieve Goals: Good    Frequency Min 2X/week   Barriers to discharge        Co-evaluation               AM-PAC PT "6 Clicks" Daily Activity  Outcome Measure Difficulty turning over in bed (including adjusting bedclothes, sheets and blankets)?: A Little Difficulty moving from lying on back to sitting on the side of the bed? : None Difficulty sitting down on and standing up from a chair with arms (e.g., wheelchair, bedside commode, etc,.)?: A Little Help needed moving to and from a bed to chair (including a wheelchair)?: None Help needed walking in hospital room?: None Help needed climbing 3-5 steps with a railing? : A Little 6 Click Score: 21    End of Session Equipment Utilized During Treatment: Gait belt Activity Tolerance: Patient tolerated treatment well Patient left: in chair;with call bell/phone within reach;with chair alarm set;with family/visitor present Nurse Communication: Mobility status PT Visit Diagnosis: Unsteadiness on feet (R26.81);History of falling (Z91.81);Muscle weakness (generalized) (M62.81)    Time: 8592-9244 PT Time Calculation (min) (ACUTE ONLY): 27 min   Charges:             Rosario Adie, SPT   Rosario Adie 10/17/2017, 5:21 PM

## 2017-10-17 NOTE — Plan of Care (Signed)
  Problem: Clinical Measurements: Goal: Ability to maintain clinical measurements within normal limits will improve Outcome: Not Progressing Note:  Hemoglobin level today = only 9.8. Baseline is apparently between 10 - 10.5. Will continue to monitor lab values. Wenda Low [facility]

## 2017-10-17 NOTE — Progress Notes (Addendum)
Progress Note  Patient Name: Jenna Young Date of Encounter: 10/17/2017  Primary Cardiologist: Ida Rogue, MD   Subjective   No chest pain overnight. She hasn't been out of bed yet this am.  Inpatient Medications    Scheduled Meds: . aspirin EC  81 mg Oral Daily  . atorvastatin  40 mg Oral q1800  . cholecalciferol  2,000 Units Oral Daily  . clopidogrel  75 mg Oral Q breakfast  . isosorbide mononitrate  15 mg Oral Daily  . metoprolol tartrate  25 mg Oral BID  . pantoprazole  40 mg Oral Daily  . sodium chloride flush  3 mL Intravenous Q12H   Continuous Infusions: . sodium chloride    . famotidine (PEPCID) IV Stopped (10/16/17 2239)  . heparin 750 Units/hr (10/16/17 1259)   PRN Meds: sodium chloride, acetaminophen, ALPRAZolam, morphine injection, nitroGLYCERIN, ondansetron (ZOFRAN) IV, sodium chloride flush   Vital Signs    Vitals:   10/16/17 1456 10/16/17 1457 10/16/17 2005 10/17/17 0317  BP:  (!) 160/66 131/71 138/60  Pulse:  88 63 63  Resp:  18 17 17   Temp:  98.6 F (37 C) 97.7 F (36.5 C) 98.4 F (36.9 C)  TempSrc:  Oral Oral Oral  SpO2:  98% 99% 96%  Weight: 59.1 kg   58 kg  Height: 5\' 1"  (1.549 m)       Intake/Output Summary (Last 24 hours) at 10/17/2017 0810 Last data filed at 10/17/2017 0653 Gross per 24 hour  Intake 392.76 ml  Output 1100 ml  Net -707.24 ml   Filed Weights   10/16/17 1059 10/16/17 1456 10/17/17 0317  Weight: 59 kg 59.1 kg 58 kg    Telemetry    NSR - Personally Reviewed  ECG    10/16/17- NSR, LVH - Personally Reviewed  Physical Exam   GEN: No acute distress.   Neck: No JVD Cardiac: RRR, no murmurs, rubs, or gallops.  Respiratory: Clear to auscultation bilaterally. GI: Soft, nontender, non-distended  MS: No edema; No deformity. Neuro:  Nonfocal  Psych: Normal affect   Labs    Chemistry Recent Labs  Lab 10/16/17 1115  NA 137  K 4.2  CL 108  CO2 23  GLUCOSE 107*  BUN 12  CREATININE 0.87  CALCIUM 8.5*    GFRNONAA 60*  GFRAA >60  ANIONGAP 6     Hematology Recent Labs  Lab 10/16/17 1115 10/17/17 0553  WBC 7.4 6.2  RBC 4.01 3.80  HGB 10.2* 9.8*  HCT 31.0* 29.4*  MCV 77.4* 77.2*  MCH 25.4* 25.7*  MCHC 32.8 33.2  RDW 18.2* 18.5*  PLT 214 204    Cardiac Enzymes Recent Labs  Lab 10/16/17 1115 10/16/17 1503 10/16/17 2048 10/17/17 0305  TROPONINI 2.48* 1.80* 2.53* 2.66*   No results for input(s): TROPIPOC in the last 168 hours.   BNPNo results for input(s): BNP, PROBNP in the last 168 hours.   DDimer No results for input(s): DDIMER in the last 168 hours.   Radiology    No results found.  Cardiac Studies   Echo 03/01/17- Study Conclusions  - Left ventricle: The cavity size was normal. Wall thickness was   increased in a pattern of mild LVH. Systolic function was normal.   The estimated ejection fraction was in the range of 55% to 60%.   There is hypokinesis of the inferolateral myocardium. Doppler   parameters are consistent with abnormal left ventricular   relaxation (grade 1 diastolic dysfunction). - Aortic valve: There  was mild regurgitation. - Mitral valve: Calcified annulus. There was mild regurgitation.  Impressions:  - Hypokinesis of the inferolateral wall with overall preserved LV   systolic function; mild diastolic dysfunction; mild LVH; mild AI;   mild MR.   Patient Profile     84 y.o. female s/p NSTEMI 12/18- unsuccessful RCA PCI, 90% residual OM1, presented 10/16/17 with chest pain followed by syncope (low B/P). Her Troponin is positive-2.66.   Assessment & Plan    NSTEMI- Troponin are positive and not yet coming down. She is currently pain free  Syncope- ? Vagal. She was hypotensive by EMS. No evidence of arrhythmia so far. ? PE-atypical presentation except for unexplained syncope.   CAD- Known RCA disease, failed PCI 12/18. Residual OM1 disease  HTN- Controlled  HLD- LDL 56 on statin Rx  Anemia- Microcytic anemia- Hgb 9.8, this  appears to be chronic, baseline Hgb 10-10.5  H/O breast CA- Lumpectomy, radiation 12/17  Plan: MD to see- ? Proceed to cath secondary to persistently elevated Troponin. Check echo (ordered).  Check D-dimer to r/o PE.      For questions or updates, please contact [place] Please consult [email or web address] for contact info under Cardiology/STEMI.      Signed, Kerin Ransom, PA-C  10/17/2017, 8:10 AM

## 2017-10-17 NOTE — Progress Notes (Signed)
*  PRELIMINARY RESULTS* Echocardiogram 2D Echocardiogram has been performed.  Jenna Young 10/17/2017, 12:02 PM

## 2017-10-17 NOTE — Plan of Care (Signed)
  Problem: Education: Goal: Knowledge of General Education information will improve Description: Including pain rating scale, medication(s)/side effects and non-pharmacologic comfort measures Outcome: Progressing   Problem: Pain Managment: Goal: General experience of comfort will improve Outcome: Progressing   Problem: Safety: Goal: Ability to remain free from injury will improve Outcome: Progressing   

## 2017-10-17 NOTE — Progress Notes (Signed)
PHARMACIST - PHYSICIAN COMMUNICATION  DR:  Leslye Peer  CONCERNING: IV to Oral Route Change Policy  RECOMMENDATION: This patient is receiving famotidine by the intravenous route.  Based on criteria approved by the Pharmacy and Therapeutics Committee, the intravenous medication(s) is/are being converted to the equivalent oral dose form(s).   DESCRIPTION: These criteria include:  The patient is eating (either orally or via tube) and/or has been taking other orally administered medications for a least 24 hours  The patient has no evidence of active gastrointestinal bleeding or impaired GI absorption (gastrectomy, short bowel, patient on TNA or NPO).  If you have questions about this conversion, please contact the Pharmacy Department  []   [phone] )  Jenna Young [x]   [phone] )  Jenna Young []   [phone] )  Jenna Young []   [phone] )  [facility] []   [phone] )  [place], [facility] 10/17/2017 9:32 AM

## 2017-10-17 NOTE — Progress Notes (Signed)
[place] for heparin Indication: chest pain/ACS  Allergies  Allergen Reactions  . Buspirone Hcl     REACTION: HEART PALPITATIONS  . Codeine     REACTION: nausea and vomiting  . Hydrochlorothiazide     REACTION: syncope-possibly from dehydration  . Lansoprazole     REACTION: abd pain  . Minocycline Hcl   . Penicillins     REACTION: mouth numbness Has patient had a PCN reaction causing immediate rash, facial/tongue/throat swelling, SOB or lightheadedness with hypotension: No Has patient had a PCN reaction causing severe rash involving mucus membranes or skin necrosis: No Has patient had a PCN reaction that required hospitalization: No Has patient had a PCN reaction occurring within the last 10 years: No If all of the above answers are "NO", then may proceed with Cephalosporin use.  Marland Kitchen Phenergan [Promethazine Hcl] Other (See Comments)    Body shakes, hallucinations  . Ramipril     Cough   . Sulfa Antibiotics Nausea Only    Patient Measurements: Height: 5\' 1"  (154.9 cm) Weight: 127 lb 14.4 oz (58 kg) IBW/kg (Calculated) : 47.8  Vital Signs: Temp: 98.4 F (36.9 C) (08/09 0317) Temp Source: Oral (08/09 0317) BP: 138/60 (08/09 0317) Pulse Rate: 63 (08/09 0317)  Labs: Recent Labs    10/16/17 1115 10/16/17 1227 10/16/17 1503 10/16/17 2048 10/17/17 0305 10/17/17 0553  HGB 10.2*  --   --   --   --  9.8*  HCT 31.0*  --   --   --   --  29.4*  PLT 214  --   --   --   --  204  APTT  --  <24*  --   --   --   --   LABPROT  --  14.1  --   --   --   --   INR  --  1.10  --   --   --   --   HEPARINUNFRC  --   --   --  0.48  --  0.47  CREATININE 0.87  --   --   --   --   --   TROPONINI 2.48*  --  1.80* 2.53* 2.66*  --     Estimated Creatinine Clearance: 40.1 mL/min (by C-G formula based on SCr of 0.87 mg/dL).   Medical History: Past Medical History:  Diagnosis Date  . Allergic rhinitis   . Anemia   . Anxiety   . GERD  (gastroesophageal reflux disease)   . History of radiation therapy 04/29/16- 05/24/16   Left Breast 40.05 Gy in 15 fractions, Left Breast boost 10 Gy in 5 fractions.   . Hyperlipidemia   . Hypertension    treated by Dr Thurnell Garbe recently with syncope episode  . Osteopenia   . Osteoporosis   . Personal history of radiation therapy 2018    Assessment: 84 y.o. female with a history of CAD, NSTEMI, hypertension, hyperlipidemia who presents for evaluation of a syncopal event with chest pain and troponin 2.48ng/mL. She is on no anticoagulants PTA. Baseline labs have been ordered but not yet resulted.  8/8:  HL @ 20:48 = 0.48 Will continue pt on current rate and draw confirmation level in 8 hrs on 8/9 @ 0500.  Goal of Therapy:  Heparin level 0.3-0.7 units/ml Monitor platelets by anticoagulation protocol: Yes   Plan:  10/17/2017 05:53 HL therapeutic x 2. Continue current rate. Will monitor HL and CBC daily per protocol  Laural Benes, Pharm.D., BCPS Clinical Pharmacist 10/17/2017,7:40 AM

## 2017-10-17 NOTE — Progress Notes (Signed)
Initial Nutrition Assessment  DOCUMENTATION CODES:   Non-severe (moderate) malnutrition in context of chronic illness  INTERVENTION:   Ensure Enlive po BID, each supplement provides 350 kcal and 20 grams of protein  MVI daily  Liberalize diet  Pt noted to have microcytic anemia; would recommend check iron, ferritin, B12, transferrin and TIBC labs to r/o deficiency.   NUTRITION DIAGNOSIS:   Moderate Malnutrition related to poor appetite, other (see comment)(advanced age) as evidenced by moderate fat depletion, moderate muscle depletion.  GOAL:   Patient will meet greater than or equal to 90% of their needs  MONITOR:   PO intake, Supplement acceptance, Labs, Weight trends, Skin, I & O's  REASON FOR ASSESSMENT:   Malnutrition Screening Tool    ASSESSMENT:   84 y.o. female with a history of CAD, NSTEMI, hypertension, hyperlipidemia who presents for evaluation of a syncopal event   Met with pt in room today. Pt reports poor appetite and oral intake at baseline; family reports that pt "eats like a bird". Pt does like supplements but does not drink them regularly. Pt avoids many foods, mainly meats as she tries to follow a "heart healthy" diet. Pt reports that she does like chicken and fish. Pt does not like or eat fruit. RD believes this pt over restricts herself in fear of worsening her CAD. Per chart, pt has lost 15lbs(10%) since December; this is not significant given the time frame but worth noting as pt has had a slow unintentional decline. RD discussed with pt today the importance of adequate protein and calorie intake to preserve lean muscle. Recommended a liberal diet as pt is not likely eating enough food to exceed nutrient limits. Also recommended a daily MVI. Pt would like to have chocolate Ensure while in hospital. RD will order supplements and MVI. RD will also liberalize diet. Pt noted to have microcytic anemia; would recommend check iron, ferritin, B12, transferrin and  TIBC labs as pt avoids a lot of meats and other foods.   Medications reviewed and include: aspirin, vitamin D, plavix, pepcid, protonix  Labs reviewed: Hgb 9.8(L), Hct 29.4(L), MCH 25.7(L), MCV 77.2(L)  NUTRITION - FOCUSED PHYSICAL EXAM:    Most Recent Value  Orbital Region  Moderate depletion  Upper Arm Region  Mild depletion  Thoracic and Lumbar Region  Mild depletion  Buccal Region  Moderate depletion  Temple Region  Moderate depletion  Clavicle Bone Region  Moderate depletion  Clavicle and Acromion Bone Region  Moderate depletion  Scapular Bone Region  Mild depletion  Dorsal Hand  Moderate depletion  Patellar Region  Unable to assess  Anterior Thigh Region  Unable to assess  Posterior Calf Region  Unable to assess  Edema (RD Assessment)  None  Hair  Reviewed  Eyes  Reviewed  Mouth  Reviewed  Skin  Reviewed  Nails  Reviewed     Diet Order:   Diet Order            Diet regular Room service appropriate? Yes; Fluid consistency: Thin  Diet effective now             EDUCATION NEEDS:   Education needs have been addressed  Skin:  Skin Assessment: Reviewed RN Assessment  Last BM:  8/8- type 3  Height:   Ht Readings from Last 1 Encounters:  10/16/17 '5\' 1"'$  (1.549 m)    Weight:   Wt Readings from Last 1 Encounters:  10/17/17 58 kg    Ideal Body Weight:  47.7 kg  BMI:  Body mass index is 24.17 kg/m.  Estimated Nutritional Needs:   Kcal:  1300-1500kcal/day   Protein:  58-70g/day   Fluid:  >1.2L/day   Koleen Distance MS, RD, LDN Pager #- [phone] Office#- [phone] After Hours Pager: [phone]

## 2017-10-17 NOTE — Progress Notes (Signed)
Patient ID: Jenna Young, female   DOB: [birth year], 83 y.o.   MRN: [id number]  [facility] PROGRESS NOTE  Jenna Young QQI:[id number] DOB: 04/03/1931 DOA: 10/16/2017 PCP: Abner Greenspan, MD  HPI/Subjective: Patient feeling okay and offers no complaints.  States she was at the beauty shop and felt like she was going to pass out sat down and took a meclizine.  Woke up in the ambulance.  No loss of urine or bowels.  Patient stated that she only ate a piece of toast and drink a couple coffee before going to the beauty shop.  Currently feels okay.  No chest pain or shortness of breath.  Objective: Vitals:   10/17/17 1404 10/17/17 1642  BP: (!) 129/51 (!) 130/55  Pulse: 63 (!) 54  Resp:  17  Temp:  98 F (36.7 C)  SpO2:  98%    Filed Weights   10/16/17 1059 10/16/17 1456 10/17/17 0317  Weight: 59 kg 59.1 kg 58 kg    ROS: Review of Systems  Constitutional: Negative for chills and fever.  Eyes: Negative for blurred vision.  Respiratory: Negative for cough and shortness of breath.   Cardiovascular: Negative for chest pain.  Gastrointestinal: Negative for abdominal pain, constipation, diarrhea, nausea and vomiting.  Genitourinary: Negative for dysuria.  Musculoskeletal: Negative for joint pain.  Neurological: Negative for dizziness and headaches.   Exam: Physical Exam  Constitutional: She is oriented to person, place, and time.  HENT:  Nose: No mucosal edema.  Mouth/Throat: No oropharyngeal exudate or posterior oropharyngeal edema.  Eyes: Pupils are equal, round, and reactive to light. Conjunctivae, EOM and lids are normal.  Neck: No JVD present. Carotid bruit is not present. No edema present. No thyroid mass and no thyromegaly present.  Cardiovascular: S1 normal and S2 normal. Exam reveals no gallop.  No murmur heard. Pulses:      Dorsalis pedis pulses are 2+ on the right side, and 2+ on the left side.  Respiratory: No respiratory distress. She has no wheezes. She has no  rhonchi. She has no rales.  GI: Soft. Bowel sounds are normal. There is no tenderness.  Musculoskeletal:       Right ankle: She exhibits no swelling.       Left ankle: She exhibits no swelling.  Lymphadenopathy:    She has no cervical adenopathy.  Neurological: She is alert and oriented to person, place, and time. No cranial nerve deficit.  Skin: Skin is warm. No rash noted. Nails show no clubbing.  Psychiatric: She has a normal mood and affect.      Data Reviewed: Basic Metabolic Panel: Recent Labs  Lab 10/16/17 1115  NA 137  K 4.2  CL 108  CO2 23  GLUCOSE 107*  BUN 12  CREATININE 0.87  CALCIUM 8.5*   CBC: Recent Labs  Lab 10/16/17 1115 10/17/17 0553  WBC 7.4 6.2  HGB 10.2* 9.8*  HCT 31.0* 29.4*  MCV 77.4* 77.2*  PLT 214 204   Cardiac Enzymes: Recent Labs  Lab 10/16/17 1115 10/16/17 1503 10/16/17 2048 10/17/17 0305  TROPONINI 2.48* 1.80* 2.53* 2.66*    Scheduled Meds: . aspirin EC  81 mg Oral Daily  . atorvastatin  40 mg Oral q1800  . cholecalciferol  2,000 Units Oral Daily  . clopidogrel  75 mg Oral Q breakfast  . famotidine  20 mg Oral Daily  . feeding supplement (ENSURE ENLIVE)  237 mL Oral BID BM  . isosorbide mononitrate  15 mg Oral Daily  .  metoprolol tartrate  25 mg Oral BID  . multivitamin with minerals  1 tablet Oral Daily  . pantoprazole  40 mg Oral Daily  . sodium chloride flush  3 mL Intravenous Q12H   Continuous Infusions: . sodium chloride      Assessment/Plan:  1. NSTEMI, history of CAD.  likely demand ischemia from syncope.  Patient on aspirin, Plavix, atorvastatin and metoprolol and Imdur.  Cardiology wanted to monitor overnight and check another troponin in the morning. 2. Syncope.  Currently not orthostatic.  No arrhythmias on telemetry.  I advised the patient that she must drink 2 glasses of water for every caffeinated beverage that she drinks.  Patient drinks a lot of tea and 1 cup of coffee per day. 3. Hyperlipidemia  unspecified on atorvastatin 4. Essential hypertension on metoprolol  Code Status:     Code Status Orders  (From admission, onward)         Start     Ordered   10/16/17 1445  Full code  Continuous     10/16/17 1444        Code Status History    Date Active Date Inactive Code Status Order ID Comments User Context   02/28/2017 2308 03/02/2017 2023 Full Code [id number]  Phillips Grout, MD Inpatient   02/10/2017 1414 02/13/2017 1742 Full Code [id number]  Nicholes Mango, MD ED     Family Communication: Family at the bedside Disposition Plan: Cardiology wanted to watch again overnight  Consultants:  Cardiology  Time spent: 28 minutes, case discussed with Dr. Fletcher Anon cardiology  Delfino Lovett [employer]

## 2017-10-18 LAB — CBC
HCT: 29.8 % — ABNORMAL LOW (ref 35.0–47.0)
Hemoglobin: 9.8 g/dL — ABNORMAL LOW (ref 12.0–16.0)
MCH: 25.7 pg — ABNORMAL LOW (ref 26.0–34.0)
MCHC: 33 g/dL (ref 32.0–36.0)
MCV: 77.9 fL — ABNORMAL LOW (ref 80.0–100.0)
Platelets: 208 10*3/uL (ref 150–440)
RBC: 3.83 MIL/uL (ref 3.80–5.20)
RDW: 18.7 % — ABNORMAL HIGH (ref 11.5–14.5)
WBC: 6.8 10*3/uL (ref 3.6–11.0)

## 2017-10-18 LAB — BASIC METABOLIC PANEL
Anion gap: 8 (ref 5–15)
BUN: 15 mg/dL (ref 8–23)
CO2: 26 mmol/L (ref 22–32)
Calcium: 8.9 mg/dL (ref 8.9–10.3)
Chloride: 105 mmol/L (ref 98–111)
Creatinine, Ser: 0.98 mg/dL (ref 0.44–1.00)
GFR calc Af Amer: >60 mL/min (ref >60)
GFR calc non Af Amer: 52 mL/min — ABNORMAL LOW (ref >60)
Glucose, Bld: 99 mg/dL (ref 70–99)
Potassium: 4.1 mmol/L (ref 3.5–5.1)
Sodium: 139 mmol/L (ref 135–145)

## 2017-10-18 LAB — TROPONIN I: Troponin I: 1.77 ng/mL (ref <0.03)

## 2017-10-18 MED ORDER — ADULT MULTIVITAMIN LIQUID CH: 5.0000 mL | Freq: Every day | ORAL | 2 refills | Status: DC

## 2017-10-18 NOTE — Discharge Instructions (Signed)
Syncope Syncope is when you temporarily lose consciousness. Syncope may also be called fainting or passing out. It is caused by a sudden decrease in blood flow to the brain. Even though most causes of syncope are not dangerous, syncope can be a sign of a serious medical problem. Signs that you may be about to faint include:  Feeling dizzy or light-headed.  Feeling nauseous.  Seeing all white or all black in your field of vision.  Having cold, clammy skin.  If you fainted, get medical help right away.Call your local emergency services (911 in the [place] Do not drive yourself to the hospital. Follow these instructions at home: Pay attention to any changes in your symptoms. Take these actions to help with your condition:  Have someone stay with you until you feel stable.  Do not drive, use machinery, or play sports until your health care provider says it is okay.  Keep all follow-up visits as told by your health care provider. This is important.  If you start to feel like you might faint, lie down right away and raise (elevate) your feet above the level of your heart. Breathe deeply and steadily. Wait until all of the symptoms have passed.  Drink enough fluid to keep your urine clear or pale yellow.  If you are taking blood pressure or heart medicine, get up slowly and take several minutes to sit and then stand. This can reduce dizziness.  Take over-the-counter and prescription medicines only as told by your health care provider.  Get help right away if:  You have a severe headache.  You have unusual pain in your chest, abdomen, or back.  You are bleeding from your mouth or rectum, or you have black or tarry stool.  You have a very fast or irregular heartbeat (palpitations).  You have pain with breathing.  You faint once or repeatedly.  You have a seizure.  You are confused.  You have trouble walking.  You have severe weakness.  You have vision problems. These  symptoms may represent a serious problem that is an emergency. Do not wait to see if your symptoms will go away. Get medical help right away. Call your local emergency services (911 in the [place] Do not drive yourself to the hospital. This information is not intended to replace advice given to you by your health care provider. Make sure you discuss any questions you have with your health care provider. Document Released: 02/25/2005 Document Revised: 08/03/2015 Document Reviewed: 11/09/2014 Elsevier Interactive Patient Education  2018 [employer]  

## 2017-10-18 NOTE — Discharge Summary (Signed)
[place] at [facility], 84 y.o., DOB [birth year], MRN [id number]. Admission date: 10/16/2017 Discharge Date 10/18/2017 Primary MD Tower, [name], MD Admitting Physician Gorden Harms, MD  Admission Diagnosis  NSTEMI (non-ST elevated myocardial infarction) [facility]) [I21.4] Syncope, unspecified syncope type [R55]  Discharge Diagnosis   Active Problems: Syncope due to dehydration and vasovagal Elevated troponin with history of coronary artery disease's felt to be due to demand ischemia and not a true non-ST MI Hyperlipidemia Malnutrition of moderate degree   Hospital Course Patient is a 84 year old female with known coronary artery disease of the right coronary.  Who presented to the ED after syncope.  Patient was noted to have systolic blood pressure in the 70s.  Given IV fluid bolus.  With normalization of her blood pressure.  Patient does have a history of right coronary artery that was not amendable to stenting or any other procedure due to severe calcification.  Patient has been medically managed.  She was seen by cardiology who recommended continuing current therapy.  Due to her low blood pressure we will not be able to add a beta-blocker or ACE inhibitor these can be added when she follows up with her cardiologist in a week.  If her blood pressure stable.        Consults  cardiology  Significant Tests:  See full reports for all details     No results found.     Today   Subjective:   Jenna Young patient doing well denies any chest pain or shortness of breath  Objective:   Blood pressure (!) 123/46, pulse (!) 56, temperature 98 F (36.7 C), temperature source Oral, resp. rate 19, height 5\' 1"  (1.549 m), weight 58.1 kg, SpO2 98 %.  .  Intake/Output Summary (Last 24 hours) at 10/18/2017 1450 Last data filed at 10/18/2017 0600 Gross per 24 hour  Intake 360 ml  Output 400 ml  Net -40 ml    Exam VITAL SIGNS: Blood pressure (!)  123/46, pulse (!) 56, temperature 98 F (36.7 C), temperature source Oral, resp. rate 19, height 5\' 1"  (1.549 m), weight 58.1 kg, SpO2 98 %.  GENERAL:  84 y.o.-year-old patient lying in the bed with no acute distress.  EYES: Pupils equal, round, reactive to light and accommodation. No scleral icterus. Extraocular muscles intact.  HEENT: Head atraumatic, normocephalic. Oropharynx and nasopharynx clear.  NECK:  Supple, no jugular venous distention. No thyroid enlargement, no tenderness.  LUNGS: Normal breath sounds bilaterally, no wheezing, rales,rhonchi or crepitation. No use of accessory muscles of respiration.  CARDIOVASCULAR: S1, S2 normal. No murmurs, rubs, or gallops.  ABDOMEN: Soft, nontender, nondistended. Bowel sounds present. No organomegaly or mass.  EXTREMITIES: No pedal edema, cyanosis, or clubbing.  NEUROLOGIC: Cranial nerves II through XII are intact. Muscle strength 5/5 in all extremities. Sensation intact. Gait not checked.  PSYCHIATRIC: The patient is alert and oriented x 3.  SKIN: No obvious rash, lesion, or ulcer.   Data Review     CBC w Diff:  Lab Results  Component Value Date   WBC 6.8 10/18/2017   HGB 9.8 (L) 10/18/2017   HGB 11.3 (L) 07/27/2013   HCT 29.8 (L) 10/18/2017   HCT 33.7 (L) 07/27/2013   PLT 208 10/18/2017   PLT 257 07/27/2013   LYMPHOPCT 34.7 03/31/2017   LYMPHOPCT 42.4 07/27/2013   MONOPCT 8.1 03/31/2017   MONOPCT 7.1 07/27/2013   EOSPCT 4.0 03/31/2017   EOSPCT 1.3 07/27/2013  BASOPCT 0.6 03/31/2017   BASOPCT 0.7 07/27/2013   CMP:  Lab Results  Component Value Date   NA 139 10/18/2017   NA CANCELED 01/03/2016   NA 136 10/22/2012   K 4.1 10/18/2017   K 3.8 10/22/2012   CL 105 10/18/2017   CL 103 10/22/2012   CO2 26 10/18/2017   CO2 27 10/22/2012   BUN 15 10/18/2017   BUN CANCELED 01/03/2016   BUN 13 10/22/2012   CREATININE 0.98 10/18/2017   CREATININE 0.95 10/22/2012   PROT 7.5 03/31/2017   PROT 8.7 (H) 10/22/2012   ALBUMIN  3.8 03/31/2017   ALBUMIN 3.9 10/22/2012   BILITOT 0.6 03/31/2017   BILITOT 0.3 10/22/2012   ALKPHOS 77 03/31/2017   ALKPHOS 85 10/22/2012   AST 15 03/31/2017   AST 20 10/22/2012   ALT 11 03/31/2017   ALT 20 10/22/2012  .  Micro Results No results found for this or any previous visit (from the past 240 hour(s)).      Code Status Orders  (From admission, onward)         Start     Ordered   10/16/17 1445  Full code  Continuous     10/16/17 1444        Code Status History    Date Active Date Inactive Code Status Order ID Comments User Context   02/28/2017 2308 03/02/2017 2023 Full Code [id number]  Phillips Grout, MD Inpatient   02/10/2017 1414 02/13/2017 1742 Full Code [id number]  Nicholes Mango, MD ED          Follow-up Information    Tower, [name], MD Follow up in 6 day(s).   Specialties:  Family Medicine, Radiology Contact information: [place] [address] [phone]        Minna Merritts, MD Follow up in 1 week(s).   Specialty:  Cardiology Why:  hosp f/u Contact information: [place] [address] [phone]           Discharge Medications   Allergies as of 10/18/2017      Reactions   Buspirone Hcl    REACTION: HEART PALPITATIONS   Codeine    REACTION: nausea and vomiting   Hydrochlorothiazide    REACTION: syncope-possibly from dehydration   Lansoprazole    REACTION: abd pain   Minocycline Hcl    Penicillins    REACTION: mouth numbness Has patient had a PCN reaction causing immediate rash, facial/tongue/throat swelling, SOB or lightheadedness with hypotension: No Has patient had a PCN reaction causing severe rash involving mucus membranes or skin necrosis: No Has patient had a PCN reaction that required hospitalization: No Has patient had a PCN reaction occurring within the last 10 years: No If all of the above answers are "NO", then may proceed with Cephalosporin use.   Phenergan  [promethazine Hcl] Other (See Comments)   Body shakes, hallucinations   Ramipril    Cough   Sulfa Antibiotics Nausea Only      Medication List    TAKE these medications   aspirin EC 81 MG EC tablet Generic drug:  aspirin Take 81 mg by mouth daily. Swallow whole.   atorvastatin 40 MG tablet Commonly known as:  LIPITOR Take 1 tablet (40 mg total) by mouth daily.   clopidogrel 75 MG tablet Commonly known as:  PLAVIX Take 1 tablet (75 mg total) by mouth daily with breakfast.   cyanocobalamin 1000 MCG/ML injection Commonly known as:  (  VITAMIN B-12) Inject 1,000 mcg into the muscle every 3 (three) months.   isosorbide mononitrate 30 MG 24 hr tablet Commonly known as:  IMDUR TAKE ONE-HALF TABLET BY MOUTH EVERY DAY   multivitamin Liqd Take 5 mLs by mouth daily.   nitroGLYCERIN 0.4 MG SL tablet Commonly known as:  NITROSTAT Place 1 tablet (0.4 mg total) under the tongue every 5 (five) minutes x 3 doses as needed for chest pain.   omeprazole 20 MG capsule Commonly known as:  PRILOSEC Take 1 capsule (20 mg total) by mouth daily as needed (acid reflux).   Vitamin D 2000 units Caps Take 1 capsule by mouth daily.          Total Time in preparing paper work, data evaluation and todays exam - 31 minutes  Dustin Flock M.D on 10/18/2017 at 2:50 PM [place]  [phone]

## 2017-10-18 NOTE — Plan of Care (Signed)
  Problem: Clinical Measurements: Goal: Ability to maintain clinical measurements within normal limits will improve Outcome: Not Progressing Note:  Patient's hemoglobin level today = only 9.8. Will continue to monitor lab values. Wenda Low [facility]

## 2017-10-18 NOTE — Plan of Care (Signed)
  Problem: Education: Goal: Knowledge of General Education information will improve Description: Including pain rating scale, medication(s)/side effects and non-pharmacologic comfort measures Outcome: Progressing   Problem: Health Behavior/Discharge Planning: Goal: Ability to manage health-related needs will improve Outcome: Progressing   Problem: Clinical Measurements: Goal: Will remain free from infection Outcome: Progressing   Problem: Pain Managment: Goal: General experience of comfort will improve Outcome: Progressing   Problem: Safety: Goal: Ability to remain free from injury will improve Outcome: Progressing   

## 2017-10-20 NOTE — Telephone Encounter (Signed)
Spoke with patients daughter per release form and reviewed that we have her post hospital visit scheduled next Wednesday the 21st at 4pm. She confirmed with no further questions at this time.

## 2017-10-20 NOTE — Telephone Encounter (Signed)
Per Pam schedule 8/21 at 4 pm .  Patient daughter thinks she should be seen this week.  She is aware patient is on wait list and will be called if something is available.

## 2017-10-20 NOTE — Telephone Encounter (Signed)
Patient daughter ann calling to schedule hospital [facility] NSTEMI asap.  Scheduled in September next available.  Added to waitlist.  Per daughter this is not soon enough and she would like to know if the nurse can work her in .  She feels like if the patient was scheduled sooner than this hospital admission and episode could have been avoided.

## 2017-10-21 ENCOUNTER — Encounter: Payer: Self-pay | Admitting: Family Medicine

## 2017-10-21 ENCOUNTER — Ambulatory Visit (INDEPENDENT_AMBULATORY_CARE_PROVIDER_SITE_OTHER): Payer: Medicare Other | Admitting: Family Medicine

## 2017-10-21 ENCOUNTER — Other Ambulatory Visit: Payer: Self-pay | Admitting: *Deleted

## 2017-10-21 VITALS — BP 140/70 | HR 81 | Temp 98.3°F | Ht 61.0 in | Wt 131.5 lb

## 2017-10-21 DIAGNOSIS — D649 Anemia, unspecified: Secondary | ICD-10-CM | POA: Diagnosis not present

## 2017-10-21 DIAGNOSIS — I10 Essential (primary) hypertension: Secondary | ICD-10-CM

## 2017-10-21 DIAGNOSIS — I2510 Atherosclerotic heart disease of native coronary artery without angina pectoris: Secondary | ICD-10-CM

## 2017-10-21 DIAGNOSIS — Z87898 Personal history of other specified conditions: Secondary | ICD-10-CM

## 2017-10-21 NOTE — Assessment & Plan Note (Signed)
Fairly stable after admission with Hb of 9.8  Some dilutional effect I suspect  Pt continues to decline further w/u This has been chronic

## 2017-10-21 NOTE — Patient Instructions (Addendum)
Please keep up a great fluid intake especially in the heat  For every tea- drink an extra water  Aim for 64 oz of fluid daily (mostly water)   Take care of yourself  Eat regular meals (with protein)  Keep tracking blood pressure   Keep [place] posted

## 2017-10-21 NOTE — Assessment & Plan Note (Signed)
Reassuring echo after hosp for syncope  Continue current medicines F/u with cardiology next week

## 2017-10-21 NOTE — Progress Notes (Signed)
Subjective:    Patient ID: Jenna Young, female    DOB: [birth year], 84 y.o.   MRN: [id number]  HPI  Here for hospitalization follow up for admission from 8/8 to 8/10 for NSTEMI and syncope   From note Discharge Diagnosis   Active Problems: Syncope due to dehydration and vasovagal Elevated troponin with history of coronary artery disease's felt to be due to demand ischemia and not a true non-ST MI Hyperlipidemia Malnutrition of moderate degree  She presented to ED after syncope with systolic bp in the 26S Given IV fluid bolus and bp normalized  This is in the setting of known RCA dz that is not amendable to stenting or other procedure  She was medically managed  Planned f/u with cardiology   Per pt-she did not feel well the am of admission  Had strange feeling across chest (not pain)  Felt tired/weak  Tried to get in with cardiology  She went on to the [employer] -stood up and felt dizzy / then sat down and passed out   She has been prone to fainting for years  Due to heat in the past  Once due to anemia  Once as a rxn to bp medicines     Results for orders placed or performed during the hospital encounter of 34/19/62  Basic metabolic panel  Result Value Ref Range   Sodium 137 135 - 145 mmol/L   Potassium 4.2 3.5 - 5.1 mmol/L   Chloride 108 98 - 111 mmol/L   CO2 23 22 - 32 mmol/L   Glucose, Bld 107 (H) 70 - 99 mg/dL   BUN 12 8 - 23 mg/dL   Creatinine, Ser 0.87 0.44 - 1.00 mg/dL   Calcium 8.5 (L) 8.9 - 10.3 mg/dL   GFR calc non Af Amer 60 (L) >60 mL/min   GFR calc Af Amer >60 >60 mL/min   Anion gap 6 5 - 15  CBC  Result Value Ref Range   WBC 7.4 3.6 - 11.0 K/uL   RBC 4.01 3.80 - 5.20 MIL/uL   Hemoglobin 10.2 (L) 12.0 - 16.0 g/dL   HCT 31.0 (L) 35.0 - 47.0 %   MCV 77.4 (L) 80.0 - 100.0 fL   MCH 25.4 (L) 26.0 - 34.0 pg   MCHC 32.8 32.0 - 36.0 g/dL   RDW 18.2 (H) 11.5 - 14.5 %   Platelets 214 150 - 440 K/uL  Urinalysis, Complete w Microscopic  Result Value  Ref Range   Color, Urine YELLOW (A) YELLOW   APPearance CLEAR (A) CLEAR   Specific Gravity, Urine 1.009 1.005 - 1.030   pH 7.0 5.0 - 8.0   Glucose, UA NEGATIVE NEGATIVE mg/dL   Hgb urine dipstick NEGATIVE NEGATIVE   Bilirubin Urine NEGATIVE NEGATIVE   Ketones, ur NEGATIVE NEGATIVE mg/dL   Protein, ur NEGATIVE NEGATIVE mg/dL   Nitrite NEGATIVE NEGATIVE   Leukocytes, UA SMALL (A) NEGATIVE   RBC / HPF 0-5 0 - 5 RBC/hpf   WBC, UA 0-5 0 - 5 WBC/hpf   Bacteria, UA NONE SEEN NONE SEEN   Squamous Epithelial / LPF 0-5 0 - 5   Mucus PRESENT   Troponin I  Result Value Ref Range   Troponin I 2.48 (HH) <0.03 ng/mL  Protime-INR  Result Value Ref Range   Prothrombin Time 14.1 11.4 - 15.2 seconds   INR 1.10   APTT  Result Value Ref Range   aPTT <24 (L) 24 - 36 seconds  Heparin level (  unfractionated)  Result Value Ref Range   Heparin Unfractionated 0.48 0.30 - 0.70 IU/mL  Troponin I  Result Value Ref Range   Troponin I 1.80 (HH) <0.03 ng/mL  Troponin I  Result Value Ref Range   Troponin I 2.53 (HH) <0.03 ng/mL  Troponin I  Result Value Ref Range   Troponin I 2.66 (HH) <0.03 ng/mL  Heparin level (unfractionated)  Result Value Ref Range   Heparin Unfractionated 0.47 0.30 - 0.70 IU/mL  CBC  Result Value Ref Range   WBC 6.2 3.6 - 11.0 K/uL   RBC 3.80 3.80 - 5.20 MIL/uL   Hemoglobin 9.8 (L) 12.0 - 16.0 g/dL   HCT 29.4 (L) 35.0 - 47.0 %   MCV 77.2 (L) 80.0 - 100.0 fL   MCH 25.7 (L) 26.0 - 34.0 pg   MCHC 33.2 32.0 - 36.0 g/dL   RDW 18.5 (H) 11.5 - 14.5 %   Platelets 204 150 - 440 K/uL  Lipid panel  Result Value Ref Range   Cholesterol 121 0 - 200 mg/dL   Triglycerides 83 <150 mg/dL   HDL 53 >40 mg/dL   Total CHOL/HDL Ratio 2.3 RATIO   VLDL 17 0 - 40 mg/dL   LDL Cholesterol 51 0 - 99 mg/dL  Fibrin derivatives D-Dimer ([facility] only)  Result Value Ref Range   Fibrin derivatives D-dimer ([facility]) 294.07 0.00 - 499.00 ng/mL (FEU)  CBC  Result Value Ref Range   WBC 6.8 3.6 - 11.0 K/uL     RBC 3.83 3.80 - 5.20 MIL/uL   Hemoglobin 9.8 (L) 12.0 - 16.0 g/dL   HCT 29.8 (L) 35.0 - 47.0 %   MCV 77.9 (L) 80.0 - 100.0 fL   MCH 25.7 (L) 26.0 - 34.0 pg   MCHC 33.0 32.0 - 36.0 g/dL   RDW 18.7 (H) 11.5 - 14.5 %   Platelets 208 150 - 440 K/uL  Troponin I  Result Value Ref Range   Troponin I 1.77 (HH) <0.03 ng/mL  Basic metabolic panel  Result Value Ref Range   Sodium 139 135 - 145 mmol/L   Potassium 4.1 3.5 - 5.1 mmol/L   Chloride 105 98 - 111 mmol/L   CO2 26 22 - 32 mmol/L   Glucose, Bld 99 70 - 99 mg/dL   BUN 15 8 - 23 mg/dL   Creatinine, Ser 0.98 0.44 - 1.00 mg/dL   Calcium 8.9 8.9 - 10.3 mg/dL   GFR calc non Af Amer 52 (L) >60 mL/min   GFR calc Af Amer >60 >60 mL/min   Anion gap 8 5 - 15  ECHOCARDIOGRAM COMPLETE  Result Value Ref Range   Weight 2,046.4 oz   Height 61 in   BP 141/55 mmHg    Echo done on 8/9 Study Conclusions  - Left ventricle: The cavity size was normal. Systolic function was   normal. The estimated ejection fraction was in the range of 50%   to 55%. Moderate hypokinesis of the inferolateral and inferior   myocardium. Doppler parameters are consistent with abnormal left   ventricular relaxation (grade 1 diastolic dysfunction). - Aortic valve: There was mild regurgitation. - Mitral valve: There was mild regurgitation.    Wt Readings from Last 3 Encounters:  10/21/17 131 lb 8 oz (59.6 kg)  10/18/17 128 lb (58.1 kg)  05/19/17 136 lb 4 oz (61.8 kg)   24.85 kg/m   BP Readings from Last 3 Encounters:  10/21/17 (!) 142/58  10/18/17 (!) 123/46  05/19/17 140/60  Pulse Readings from Last 3 Encounters:  10/21/17 81  10/18/17 (!) 56  05/19/17 75   Takes asa 81 mg and plavix   Atorvastatin for cholesterol  imdur  Prn nitro   She still feels tired  Watching her diet closely  No longer has the funny feeling in her chest   Has not been back to exercise   Patient Active Problem List   Diagnosis Date Noted  . Malnutrition of moderate  degree 10/17/2017  . Encounter for screening mammogram for breast cancer 04/01/2017  . H/O syncope 02/28/2017  . CAD (coronary artery disease), native coronary artery 02/21/2017  . NSTEMI (non-ST elevated myocardial infarction) ([place]) 02/10/2017  . Carcinoma of central portion of left breast in female, estrogen receptor negative ([place]) 04/17/2016  . Ductal carcinoma in situ (DCIS) of left breast 02/26/2016  . Varicose vein of leg 10/09/2015  . Pedal edema 10/09/2015  . Heart murmur 10/09/2015  . History of spinal fracture 05/07/2015  . Lumbar pain 05/03/2015  . Dysthymia 09/20/2013  . Encounter for Medicare annual wellness exam 02/03/2013  . BPPV (benign paroxysmal positional vertigo) 06/15/2012  . Chronic right ear pain 03/16/2012  . Colon cancer screening 01/17/2012  . Anemia 01/17/2012  . GRIEF REACTION 08/01/2009  . Vitamin D deficiency 08/19/2008  . ANXIETY 01/29/2008  . Essential hypertension, benign 09/25/2007  . B12 deficiency 09/24/2006  . Hyperlipidemia 09/24/2006  . Venous (peripheral) insufficiency 09/24/2006  . ALLERGIC RHINITIS 09/24/2006  . GERD 09/24/2006  . Osteoporosis 09/24/2006   Past Medical History:  Diagnosis Date  . Allergic rhinitis   . Anemia   . Anxiety   . GERD (gastroesophageal reflux disease)   . History of radiation therapy 04/29/16- 05/24/16   Left Breast 40.05 Gy in 15 fractions, Left Breast boost 10 Gy in 5 fractions.   . Hyperlipidemia   . Hypertension    treated by Dr Thurnell Garbe recently with syncope episode  . Osteopenia   . Osteoporosis   . Personal history of radiation therapy 2018   Past Surgical History:  Procedure Laterality Date  . APPENDECTOMY    . BREAST BIOPSY Left 11/29/2008  . BREAST BIOPSY Left 01/26/2016   malignant  . BREAST EXCISIONAL BIOPSY Left 12/26/2008  . BREAST LUMPECTOMY Left 02/27/2016  . BREAST LUMPECTOMY Left 04/01/2016  . BREAST LUMPECTOMY WITH RADIOACTIVE SEED LOCALIZATION Left 02/27/2016   Procedure:  LEFT BREAST LUMPECTOMY WITH RADIOACTIVE SEED LOCALIZATION;  Surgeon: Autumn Messing III, MD;  Location: [facility];  Service: General;  Laterality: Left;  . BREAST SURGERY  10/10   benign biopsy  negative  . CESAREAN SECTION    . CHOLECYSTECTOMY    . CORONARY STENT INTERVENTION N/A 02/11/2017   Procedure: CORONARY STENT INTERVENTION;  Surgeon: Nelva Bush, MD;  Location: [facility];  Service: Cardiovascular;  Laterality: N/A;  . FEMUR FRACTURE SURGERY Right 2006  . LEFT HEART CATH AND CORONARY ANGIOGRAPHY N/A 02/11/2017   Procedure: LEFT HEART CATH AND CORONARY ANGIOGRAPHY;  Surgeon: Nelva Bush, MD;  Location: [facility];  Service: Cardiovascular;  Laterality: N/A;  . OVARIAN CYST SURGERY    . RE-EXCISION OF BREAST LUMPECTOMY Left 04/01/2016   Procedure: RE-EXCISION OF LEFT BREAST MEDIAL MARGIN;  Surgeon: Autumn Messing III, MD;  Location: [place];  Service: General;  Laterality: Left;   Social History   Tobacco Use  . Smoking status: Former Smoker    Last attempt to quit: 03/11/1968    Years since quitting:  49.6  . Smokeless tobacco: Never Used  Substance Use Topics  . Alcohol use: No    Alcohol/week: 0.0 standard drinks  . Drug use: No   Family History  Problem Relation Age of Onset  . Hypertension Mother   . Heart disease Father   . Macular degeneration Sister   . Diabetes Maternal Aunt   . Cancer Maternal Aunt        throat   Allergies  Allergen Reactions  . Buspirone Hcl     REACTION: HEART PALPITATIONS  . Codeine     REACTION: nausea and vomiting  . Hydrochlorothiazide     REACTION: syncope-possibly from dehydration  . Lansoprazole     REACTION: abd pain  . Minocycline Hcl   . Penicillins     REACTION: mouth numbness Has patient had a PCN reaction causing immediate rash, facial/tongue/throat swelling, SOB or lightheadedness with hypotension: No Has patient had a PCN reaction causing severe rash involving mucus  membranes or skin necrosis: No Has patient had a PCN reaction that required hospitalization: No Has patient had a PCN reaction occurring within the last 10 years: No If all of the above answers are "NO", then may proceed with Cephalosporin use.  Marland Kitchen Phenergan [Promethazine Hcl] Other (See Comments)    Body shakes, hallucinations  . Ramipril     Cough   . Sulfa Antibiotics Nausea Only   Current Outpatient Medications on File Prior to Visit  Medication Sig Dispense Refill  . aspirin (ASPIRIN EC) 81 MG EC tablet Take 81 mg by mouth daily. Swallow whole.     Marland Kitchen atorvastatin (LIPITOR) 40 MG tablet Take 1 tablet (40 mg total) by mouth daily. 90 tablet 3  . Cholecalciferol (VITAMIN D) 2000 UNITS CAPS Take 1 capsule by mouth daily.      . clopidogrel (PLAVIX) 75 MG tablet Take 1 tablet (75 mg total) by mouth daily with breakfast. 90 tablet 1  . cyanocobalamin (,VITAMIN B-12,) 1000 MCG/ML injection Inject 1,000 mcg into the muscle every 3 (three) months.    . isosorbide mononitrate (IMDUR) 30 MG 24 hr tablet TAKE ONE-HALF TABLET BY MOUTH EVERY DAY 15 tablet 3  . Multiple Vitamin (MULTIVITAMIN) LIQD Take 5 mLs by mouth daily. 1 Bottle 2  . nitroGLYCERIN (NITROSTAT) 0.4 MG SL tablet Place 1 tablet (0.4 mg total) under the tongue every 5 (five) minutes x 3 doses as needed for chest pain. 30 tablet 2  . omeprazole (PRILOSEC) 20 MG capsule Take 1 capsule (20 mg total) by mouth daily as needed (acid reflux). 90 capsule 1   No current facility-administered medications on file prior to visit.     Review of Systems  Constitutional: Positive for fatigue. Negative for activity change, appetite change, fever and unexpected weight change.  HENT: Negative for congestion, ear pain, rhinorrhea, sinus pressure and sore throat.   Eyes: Negative for pain, redness and visual disturbance.  Respiratory: Negative for cough, chest tightness, shortness of breath and wheezing.   Cardiovascular: Negative for chest pain,  palpitations and leg swelling.  Gastrointestinal: Negative for abdominal pain, blood in stool, constipation and diarrhea.  Endocrine: Negative for polydipsia and polyuria.  Genitourinary: Negative for dysuria, frequency and urgency.  Musculoskeletal: Negative for arthralgias, back pain and myalgias.  Skin: Negative for pallor and rash.  Allergic/Immunologic: Negative for environmental allergies.  Neurological: Positive for syncope. Negative for dizziness, tremors, seizures, facial asymmetry, speech difficulty, weakness, light-headedness, numbness and headaches.       Feeling stronger  Hematological: Negative for adenopathy. Does not bruise/bleed easily.  Psychiatric/Behavioral: Negative for decreased concentration and dysphoric mood. The patient is not nervous/anxious.        Objective:   Physical Exam  Constitutional: She appears well-developed and well-nourished. No distress.  Well appearing   HENT:  Head: Normocephalic and atraumatic.  Mouth/Throat: Oropharynx is clear and moist.  Eyes: Pupils are equal, round, and reactive to light. Conjunctivae and EOM are normal.  Neck: Normal range of motion. Neck supple. No JVD present. Carotid bruit is not present. No thyromegaly present.  Cardiovascular: Normal rate, regular rhythm and intact distal pulses. Exam reveals no gallop.  Murmur heard. Pulmonary/Chest: Effort normal and breath sounds normal. No stridor. No respiratory distress. She has no wheezes. She has no rales. She exhibits no tenderness.  No crackles  Abdominal: Soft. Bowel sounds are normal. She exhibits no distension, no abdominal bruit and no mass. There is no tenderness.  Musculoskeletal: She exhibits no edema or deformity.  Lymphadenopathy:    She has no cervical adenopathy.  Neurological: She is alert. She has normal reflexes. She displays normal reflexes. No cranial nerve deficit. She exhibits normal muscle tone. Coordination normal.  Steady gait   Skin: Skin is warm  and dry. No rash noted.  Fair complexion   Psychiatric: She has a normal mood and affect. Cognition and memory are normal.  Pleasant   Supportive daughter present           Assessment & Plan:   Problem List Items Addressed This Visit      Cardiovascular and Mediastinum   CAD (coronary artery disease), native coronary artery    Reassuring echo after hosp for syncope  Continue current medicines F/u with cardiology next week      Essential hypertension, benign    Recent hosp for syncope /cardiac event  Hypotension/dehydration at onset Much improved with fluids  BP: 140/70  Stable today  For cardiology f/u next week  She will continue to monitor at home         Other   Anemia    Fairly stable after admission with Hb of 9.8  Some dilutional effect I suspect  Pt continues to decline further w/u This has been chronic        H/O syncope - Primary    S/p hosp for another episode of vasovagal syncope (at [employer])  Reviewed hospital records, lab results and studies in detail   Doing much better with fluids /resolved suspected dehydration Disc goals for fluid intake (as well as regular meals) Elevated troponin- ? From demand ischemia  Pt will f/u with cardiology next week  bp is stable  Daughter is watching her closely

## 2017-10-21 NOTE — Assessment & Plan Note (Signed)
Recent hosp for syncope /cardiac event  Hypotension/dehydration at onset Much improved with fluids  BP: 140/70  Stable today  For cardiology f/u next week  She will continue to monitor at home

## 2017-10-21 NOTE — Assessment & Plan Note (Signed)
S/p hosp for another episode of vasovagal syncope (at [employer])  Reviewed hospital records, lab results and studies in detail   Doing much better with fluids /resolved suspected dehydration Disc goals for fluid intake (as well as regular meals) Elevated troponin- ? From demand ischemia  Pt will f/u with cardiology next week  bp is stable  Daughter is watching her closely

## 2017-10-22 ENCOUNTER — Ambulatory Visit: Payer: Medicare Other | Admitting: Family Medicine

## 2017-10-28 NOTE — Progress Notes (Signed)
Cardiology Office Note  Date:  10/29/2017   ID:  Jenna Young, DOB 1931/11/13, MRN [id number]  PCP:  Abner Greenspan, MD   Chief Complaint  Patient presents with  . Other    Hospital follow up 10/16/2017 for NSTEMI at [facility]. Patient c/o "funny feeling in her chest" Meds reviewed verbally with patient.     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   When EMS arrived patient's blood glucose was 137 and her blood pressure was 74/43 for which she received 500 cc of fluid.  In the hospital, TNT 2.48 Hospital records reviewed with the patient in detail She was started on heparin, medical management No cardiac catheterization performed as numbers were trending down and she was asymptomatic  Etiology of the events unclear whether this was vasovagal, arrhythmia,  Lab work did not appear to be indicative of dehydration  She does report having rare episodes of a "funny feeling across the top of her chest" typically happens when she lays down at night Last episode took a nitroglycerin and symptoms seem to resolve No symptoms on exertion  EKG personally reviewed by myself on todays visit Shows normal sinus rhythm rate 81 bpm left anterior fascicular block  Other past medical history reviewed hospital Admission for NSTEMI , 02/10/2017 D/c 02/13/2017  Cath 1. Significant mid RCA disease with sequential, calcified 80 and 95% stenoses. Element of thrombus is likely also present. 2. 90% ostial stenosis  involving small OM1 branch. 3. Mildly reduced left ventricular contraction with inferior hypokinesis (LVEF ~45%). 4. Mildly elevated left ventricular filling pressure. 5. Unsuccessful PCI to mid RCA due to inability to cross the mid RCA stenosis despite aggressive buddy wire support.  Recommendation : if she has unstable angina symptoms could considerPCI with atherectomy to mid RCA   Echo 51/0258 Systolic function was normal. The   estimated ejection fraction was in the range of 50% to 55%.   Hypokinesis of the inferior myocardium.  Other past medical history reviewed diagnosis of DCIS, had a biopsy bx Completed XRT   previously on amlodipine but wanted to change secondary to leg edema Started on bystolic Blood pressure continue to run high Started on HCTZ every other day  could not afford Bystolic long term   was changed to clonidine.  Presented to the emergency room December 31 2015 with near syncope , dehydration  "Fell asleep in the sun " while she was on a swing set, was difficult to arouse, was taken inside by family, had large bowel movement   in the emergency room heart rate 62 bpm , sodium down to 129, creatinine elevated Notes indicate she was only taking clonidine once a day   Seen by our office on 01/03/2016  Blood pressure was 196/80 in the office, she was not taking any of her medications She declined a 30 day monitor, declined ischemic evaluation Restarted on losartan 100 mg daily, HCTZ every other day, Imdur 30 mg daily In follow-up blood pressures 140s up  to 150 Recommended to increase Imdur up to 60 mg daily on January 08 2016 On November 5, presented to the emergency room for near-syncope EMS noted systolic pressure 92. Emergency room low pressure 130/62 Orthostatics negative in the emergency room UTI was treated with keflex, Imdur decreased back to 30 mg daily On lab work she did not appear dehydrated, hematocrit 31.7 Subsequent lab work sodium was 126 On  her last clinic visit HCTZ held, amlodipine held, continued on losartan 100 daily and Imdur 30 daily  Previous lab work reviewed total cholesterol 200, LDL 115 Hematocrit 32 Previous abdominal symptoms with iron pills, dizziness, possible vasovagal symptoms   PMH:   has a past medical history of Allergic rhinitis, Anemia, Anxiety, GERD (gastroesophageal reflux disease), History of radiation therapy (04/29/16- 05/24/16), Hyperlipidemia, Hypertension, Osteopenia, Osteoporosis, and Personal history of radiation therapy (2018).  PSH:    Past Surgical History:  Procedure Laterality Date  . APPENDECTOMY    . BREAST BIOPSY Left 11/29/2008  . BREAST BIOPSY Left 01/26/2016   malignant  . BREAST EXCISIONAL BIOPSY Left 12/26/2008  . BREAST LUMPECTOMY Left 02/27/2016  . BREAST LUMPECTOMY Left 04/01/2016  . BREAST LUMPECTOMY WITH RADIOACTIVE SEED LOCALIZATION Left 02/27/2016   Procedure: LEFT BREAST LUMPECTOMY WITH RADIOACTIVE SEED LOCALIZATION;  Surgeon: Autumn Messing III, MD;  Location: [place];  Service: General;  Laterality: Left;  . BREAST SURGERY  10/10   benign biopsy  negative  . CESAREAN SECTION    . CHOLECYSTECTOMY    . CORONARY STENT INTERVENTION N/A 02/11/2017   Procedure: CORONARY STENT INTERVENTION;  Surgeon: Nelva Bush, MD;  Location: [place] [facility];  Service: Cardiovascular;  Laterality: N/A;  . FEMUR FRACTURE SURGERY Right 2006  . LEFT HEART CATH AND CORONARY ANGIOGRAPHY N/A 02/11/2017   Procedure: LEFT HEART CATH AND CORONARY ANGIOGRAPHY;  Surgeon: Nelva Bush, MD;  Location: [place] [facility];  Service: Cardiovascular;  Laterality: N/A;  . OVARIAN CYST SURGERY    . RE-EXCISION OF BREAST LUMPECTOMY Left 04/01/2016   Procedure: RE-EXCISION OF LEFT BREAST MEDIAL MARGIN;  Surgeon: Autumn Messing III, MD;  Location: [place];  Service: General;  Laterality: Left;    Current Outpatient Medications  Medication Sig Dispense Refill  .  aspirin (ASPIRIN EC) 81 MG EC tablet Take 81 mg by mouth daily. Swallow whole.     Marland Kitchen atorvastatin (LIPITOR) 40 MG tablet Take 1 tablet (40 mg total) by mouth daily. 90 tablet 3  . Cholecalciferol (VITAMIN D) 2000 UNITS CAPS Take 1 capsule by mouth daily.      . clopidogrel (PLAVIX) 75 MG tablet Take 1 tablet (75 mg total) by mouth daily with breakfast. 90 tablet 1  . cyanocobalamin (,VITAMIN B-12,) 1000 MCG/ML injection Inject 1,000 mcg into the muscle every 3 (three) months.    . isosorbide mononitrate (IMDUR) 30 MG 24 hr tablet TAKE ONE-HALF TABLET BY MOUTH EVERY DAY 15 tablet 3  . Multiple Vitamin (MULTIVITAMIN) LIQD Take 5 mLs by mouth daily. 1 Bottle 2  . nitroGLYCERIN (NITROSTAT) 0.4 MG SL tablet Place 1 tablet (0.4 mg total) under the tongue every 5 (five) minutes x 3 doses as needed for chest pain. 30 tablet 2  . omeprazole (PRILOSEC) 20 MG capsule Take 1 capsule (20 mg total) by mouth daily as needed (acid reflux). 90 capsule 1   No current facility-administered medications for this visit.      Allergies:   Buspirone hcl; Codeine; Hydrochlorothiazide; Lansoprazole; Minocycline hcl; Penicillins; Phenergan [promethazine  hcl]; Ramipril; and Sulfa antibiotics   Social History:  The patient  reports that she quit smoking about 49 years ago. She has never used smokeless tobacco. She reports that she does not drink alcohol or use drugs.   Family History:   family history includes Cancer in her maternal aunt; Diabetes in her maternal aunt; Heart disease in her father; Hypertension in her mother; Macular degeneration in her sister.    Review of Systems: Review of Systems  Constitutional: Negative.   Respiratory: Negative.   Cardiovascular: Negative.   Gastrointestinal: Negative.   Musculoskeletal: Negative.   Neurological: Negative.   Psychiatric/Behavioral: The patient is nervous/anxious.   All other systems reviewed and are negative.    PHYSICAL EXAM: VS:  BP (!) 156/66 (BP  Location: Left Arm, Patient Position: Sitting, Cuff Size: Normal)   Pulse 81   Ht 5\' 1"  (1.549 m)   Wt 130 lb (59 kg)   BMI 24.56 kg/m  , BMI Body mass index is 24.56 kg/m.  Constitutional:  oriented to person, place, and time. No distress.  HENT:  Head: Normocephalic and atraumatic.  Eyes:  no discharge. No scleral icterus.  Neck: Normal range of motion. Neck supple. No JVD present.  Cardiovascular: Normal rate, regular rhythm, normal heart sounds and intact distal pulses. Exam reveals no gallop and no friction rub. No edema No murmur heard. Pulmonary/Chest: Effort normal and breath sounds normal. No stridor. No respiratory distress.  no wheezes.  no rales.  no tenderness.  Abdominal: Soft.  no distension.  no tenderness.  Musculoskeletal: Normal range of motion.  no  tenderness or deformity.  Neurological:  normal muscle tone. Coordination normal. No atrophy Skin: Skin is warm and dry. No rash noted. not diaphoretic.  Psychiatric:  normal mood and affect. behavior is normal. Thought content normal.    Recent Labs: 03/31/2017: ALT 11; TSH 1.19 10/18/2017: BUN 15; Creatinine, Ser 0.98; Hemoglobin 9.8; Platelets 208; Potassium 4.1; Sodium 139    Lipid Panel Lab Results  Component Value Date   CHOL 121 10/17/2017   HDL 53 10/17/2017   LDLCALC 51 10/17/2017   TRIG 83 10/17/2017      Wt Readings from Last 3 Encounters:  10/29/17 130 lb (59 kg)  10/21/17 131 lb 8 oz (59.6 kg)  10/18/17 128 lb (58.1 kg)       ASSESSMENT AND PLAN:  Essential hypertension, benign - Plan: EKG 12-Lead Blood pressure is well controlled on today's visit. No changes made to the medications. Stable  Palpitations - Plan: EKG 12-Lead Denies any symptoms, active at baseline, no medication changes made  CAD, non-STEMI Denies having any chest burning Reports having a funny feeling in her chest at chest when laying down in bed Atypical in nature Recommended she call us if symptoms persist  especially with exertion Pharmacologic Myoview could be performed to rule out large regions of ischemia High suspicion of occluded or subtotally occluded RCA,  Recommended we not pursue intervention at this time given minimal  symptoms  Pure hypercholesterolemia Continue Lipitor 40 daily Numbers at goal  Ductal carcinoma in situ (DCIS) of left breast Treatment is complete, radiation to the left  Syncope Recent episode getting up from a chair to pay her bill at beauty salon Unable to include vasovagal She was hypertensive Recovered relatively quickly Troponin elevation in the setting of hypotension and severe underlying coronary disease Denies having active anginal symptoms with exertion   Long discussion concerning anginal symptoms, previous catheterization results Long discussion concerning  recent syncope  Total encounter time more than 45 minutes  Greater than 50% was spent in counseling and coordination of care with the patient   Disposition:   F/U  6 months   Orders Placed This Encounter  Procedures  . EKG 12-Lead     Signed, Esmond Plants, M.D., Ph.D. 10/29/2017  [place], [place]

## 2017-10-29 ENCOUNTER — Encounter: Payer: Self-pay | Admitting: Cardiovascular Disease

## 2017-10-29 ENCOUNTER — Ambulatory Visit (INDEPENDENT_AMBULATORY_CARE_PROVIDER_SITE_OTHER): Payer: Medicare Other | Admitting: Cardiovascular Disease

## 2017-10-29 VITALS — BP 156/66 | HR 81 | Ht 61.0 in | Wt 130.0 lb

## 2017-10-29 DIAGNOSIS — I25118 Atherosclerotic heart disease of native coronary artery with other forms of angina pectoris: Secondary | ICD-10-CM | POA: Diagnosis not present

## 2017-10-29 DIAGNOSIS — E7800 Pure hypercholesterolemia, unspecified: Secondary | ICD-10-CM

## 2017-10-29 DIAGNOSIS — I10 Essential (primary) hypertension: Secondary | ICD-10-CM | POA: Diagnosis not present

## 2017-10-29 DIAGNOSIS — I214 Non-ST elevation (NSTEMI) myocardial infarction: Secondary | ICD-10-CM

## 2017-10-29 DIAGNOSIS — I2510 Atherosclerotic heart disease of native coronary artery without angina pectoris: Secondary | ICD-10-CM | POA: Diagnosis not present

## 2017-10-29 DIAGNOSIS — R55 Syncope and collapse: Secondary | ICD-10-CM | POA: Diagnosis not present

## 2017-10-29 NOTE — Patient Instructions (Addendum)

## 2017-10-30 ENCOUNTER — Encounter: Payer: Medicare Other | Attending: Cardiovascular Disease | Admitting: *Deleted

## 2017-10-30 DIAGNOSIS — I214 Non-ST elevation (NSTEMI) myocardial infarction: Secondary | ICD-10-CM

## 2017-10-30 NOTE — Progress Notes (Signed)
Incomplete Session Note  Patient Details  Name: Jenna Young MRN: [id number] Date of Birth: 1931-11-20 Referring Provider:     Cardiac Rehab from 02/20/2017 in [facility]  Referring Provider  End      [name] did not complete her rehab session.  Patient did not meet program criteria. She is going to continue [employer].

## 2017-11-17 DIAGNOSIS — Z961 Presence of intraocular lens: Secondary | ICD-10-CM | POA: Diagnosis not present

## 2017-11-17 DIAGNOSIS — H16142 Punctate keratitis, left eye: Secondary | ICD-10-CM | POA: Diagnosis not present

## 2017-11-17 DIAGNOSIS — H02055 Trichiasis without entropian left lower eyelid: Secondary | ICD-10-CM | POA: Diagnosis not present

## 2017-11-20 ENCOUNTER — Ambulatory Visit: Payer: Medicare Other | Admitting: Cardiovascular Disease

## 2017-11-24 ENCOUNTER — Encounter

## 2017-12-22 ENCOUNTER — Other Ambulatory Visit: Payer: Self-pay | Admitting: Cardiovascular Disease

## 2018-01-13 ENCOUNTER — Ambulatory Visit: Payer: Medicare Other

## 2018-01-14 ENCOUNTER — Ambulatory Visit (INDEPENDENT_AMBULATORY_CARE_PROVIDER_SITE_OTHER): Payer: Medicare Other | Admitting: *Deleted

## 2018-01-14 DIAGNOSIS — E538 Deficiency of other specified B group vitamins: Secondary | ICD-10-CM

## 2018-01-14 DIAGNOSIS — Z23 Encounter for immunization: Secondary | ICD-10-CM | POA: Diagnosis not present

## 2018-01-14 MED ORDER — CYANOCOBALAMIN 1000 MCG/ML IJ SOLN
1000.0000 ug | Freq: Once | INTRAMUSCULAR | Status: AC
  Administered 2018-01-14: 1000 ug via INTRAMUSCULAR

## 2018-01-14 NOTE — Progress Notes (Signed)
Per orders of Dr. Tower, injection of Vitamin B12 given by [place], Donna Simpson. Patient tolerated injection well.     

## 2018-02-18 DIAGNOSIS — Z961 Presence of intraocular lens: Secondary | ICD-10-CM | POA: Diagnosis not present

## 2018-02-18 DIAGNOSIS — H02055 Trichiasis without entropian left lower eyelid: Secondary | ICD-10-CM | POA: Diagnosis not present

## 2018-04-07 ENCOUNTER — Telehealth: Payer: Self-pay | Admitting: Family Medicine

## 2018-04-07 DIAGNOSIS — M810 Age-related osteoporosis without current pathological fracture: Secondary | ICD-10-CM

## 2018-04-07 DIAGNOSIS — I10 Essential (primary) hypertension: Secondary | ICD-10-CM

## 2018-04-07 DIAGNOSIS — E782 Mixed hyperlipidemia: Secondary | ICD-10-CM

## 2018-04-07 DIAGNOSIS — E559 Vitamin D deficiency, unspecified: Secondary | ICD-10-CM

## 2018-04-07 DIAGNOSIS — E538 Deficiency of other specified B group vitamins: Secondary | ICD-10-CM

## 2018-04-07 NOTE — Telephone Encounter (Signed)
-----   Message from Eustace Pen, LPN sent at 2/76/1470  3:11 PM EST ----- Regarding: Labs 1/29 Lab orders needed. Thank you.

## 2018-04-08 ENCOUNTER — Ambulatory Visit (INDEPENDENT_AMBULATORY_CARE_PROVIDER_SITE_OTHER): Payer: Medicare Other

## 2018-04-08 VITALS — BP 150/60 | HR 77 | Temp 97.7°F | Ht 62.0 in | Wt 129.0 lb

## 2018-04-08 DIAGNOSIS — Z0000 Encounter for general adult medical examination without abnormal findings: Secondary | ICD-10-CM | POA: Diagnosis not present

## 2018-04-08 DIAGNOSIS — E538 Deficiency of other specified B group vitamins: Secondary | ICD-10-CM

## 2018-04-08 DIAGNOSIS — E782 Mixed hyperlipidemia: Secondary | ICD-10-CM

## 2018-04-08 DIAGNOSIS — I10 Essential (primary) hypertension: Secondary | ICD-10-CM | POA: Diagnosis not present

## 2018-04-08 DIAGNOSIS — E559 Vitamin D deficiency, unspecified: Secondary | ICD-10-CM

## 2018-04-08 LAB — COMPREHENSIVE METABOLIC PANEL
ALT: 28 U/L (ref 0–35)
AST: 43 U/L — ABNORMAL HIGH (ref 0–37)
Albumin: 3.9 g/dL (ref 3.5–5.2)
Alkaline Phosphatase: 91 U/L (ref 39–117)
BUN: 10 mg/dL (ref 6–23)
CO2: 27 mEq/L (ref 19–32)
Calcium: 9.7 mg/dL (ref 8.4–10.5)
Chloride: 103 mEq/L (ref 96–112)
Creatinine, Ser: 0.88 mg/dL (ref 0.40–1.20)
GFR: 61.18 mL/min (ref >60.00)
Glucose, Bld: 101 mg/dL — ABNORMAL HIGH (ref 70–99)
Potassium: 4.7 mEq/L (ref 3.5–5.1)
Sodium: 138 mEq/L (ref 135–145)
Total Bilirubin: 0.4 mg/dL (ref 0.2–1.2)
Total Protein: 7.9 g/dL (ref 6.0–8.3)

## 2018-04-08 LAB — LIPID PANEL
Cholesterol: 140 mg/dL (ref 0–200)
HDL: 54 mg/dL (ref >39.00)
LDL Cholesterol: 66 mg/dL (ref 0–99)
NonHDL: 86.12
Total CHOL/HDL Ratio: 3
Triglycerides: 101 mg/dL (ref 0.0–149.0)
VLDL: 20.2 mg/dL (ref 0.0–40.0)

## 2018-04-08 LAB — CBC WITH DIFFERENTIAL/PLATELET
Basophils Absolute: 0.1 10*3/uL (ref 0.0–0.1)
Basophils Relative: 0.9 % (ref 0.0–3.0)
Eosinophils Absolute: 0.1 10*3/uL (ref 0.0–0.7)
Eosinophils Relative: 2.1 % (ref 0.0–5.0)
HCT: 31.5 % — ABNORMAL LOW (ref 36.0–46.0)
Hemoglobin: 10.2 g/dL — ABNORMAL LOW (ref 12.0–15.0)
Lymphocytes Relative: 37.9 % (ref 12.0–46.0)
Lymphs Abs: 2.3 10*3/uL (ref 0.7–4.0)
MCHC: 32.2 g/dL (ref 30.0–36.0)
MCV: 76.2 fl — ABNORMAL LOW (ref 78.0–100.0)
Monocytes Absolute: 0.5 10*3/uL (ref 0.1–1.0)
Monocytes Relative: 9 % (ref 3.0–12.0)
Neutro Abs: 3 10*3/uL (ref 1.4–7.7)
Neutrophils Relative %: 50.1 % (ref 43.0–77.0)
Platelets: 235 10*3/uL (ref 150.0–400.0)
RBC: 4.14 Mil/uL (ref 3.87–5.11)
RDW: 17.4 % — ABNORMAL HIGH (ref 11.5–15.5)
WBC: 6 10*3/uL (ref 4.0–10.5)

## 2018-04-08 LAB — TSH: TSH: 2.34 u[IU]/mL (ref 0.35–4.50)

## 2018-04-08 LAB — VITAMIN D 25 HYDROXY (VIT D DEFICIENCY, FRACTURES): VITD: 69.39 ng/mL (ref 30.00–100.00)

## 2018-04-08 LAB — VITAMIN B12: Vitamin B-12: 393 pg/mL (ref 211–911)

## 2018-04-08 NOTE — Progress Notes (Signed)
Subjective:   Jenna Young is a 85 y.o. female who presents for Medicare Annual (Subsequent) preventive examination.  Review of Systems:  N/A Cardiac Risk Factors include: advanced age (>62men, >21 women);hypertension;dyslipidemia     Objective:     Vitals: BP (!) 150/60 (BP Location: Right Arm, Patient Position: Sitting, Cuff Size: Normal) Comment: pt is very anxious  Pulse 77   Temp 97.7 F (36.5 C) (Oral)   Ht 5\' 2"  (1.575 m) Comment: shoes  Wt 129 lb (58.5 kg)   SpO2 98%   BMI 23.59 kg/m   Body mass index is 23.59 kg/m.  Advanced Directives 04/08/2018 10/16/2017 10/16/2017 03/31/2017 03/01/2017 02/20/2017 02/11/2017  Does Patient Have a Medical Advance Directive? No No No No No No -  Type of Advance Directive - - - - - - -  Does patient want to make changes to medical advance directive? - - - - - - -  Would patient like information on creating a medical advance directive? No - Patient declined No - Patient declined No - Patient declined No - Patient declined No - Patient declined No - Patient declined No - Patient declined    Tobacco Social History   Tobacco Use  Smoking Status Former Smoker  . Last attempt to quit: 03/11/1968  . Years since quitting: 50.1  Smokeless Tobacco Never Used     Counseling given: No   Clinical Intake:  Pre-visit preparation completed: Yes  Pain : No/denies pain Pain Score: 0-No pain     Nutritional Status: BMI of 19-24  Normal Nutritional Risks: None Diabetes: No  How often do you need to have someone help you when you read instructions, pamphlets, or other written materials from your doctor or pharmacy?: 1 - Never What is the last grade level you completed in school?: 12th grade  Interpreter Needed?: No  Comments: pt is a widow and lives alone Information entered by :: LPinson, LPN  Past Medical History:  Diagnosis Date  . Allergic rhinitis   . Anemia   . Anxiety   . GERD (gastroesophageal reflux disease)   . History of  radiation therapy 04/29/16- 05/24/16   Left Breast 40.05 Gy in 15 fractions, Left Breast boost 10 Gy in 5 fractions.   . Hyperlipidemia   . Hypertension    treated by Dr Thurnell Garbe recently with syncope episode  . Osteopenia   . Osteoporosis   . Personal history of radiation therapy 2018   Past Surgical History:  Procedure Laterality Date  . APPENDECTOMY    . BREAST BIOPSY Left 11/29/2008  . BREAST BIOPSY Left 01/26/2016   malignant  . BREAST EXCISIONAL BIOPSY Left 12/26/2008  . BREAST LUMPECTOMY Left 02/27/2016  . BREAST LUMPECTOMY Left 04/01/2016  . BREAST LUMPECTOMY WITH RADIOACTIVE SEED LOCALIZATION Left 02/27/2016   Procedure: LEFT BREAST LUMPECTOMY WITH RADIOACTIVE SEED LOCALIZATION;  Surgeon: Autumn Messing III, MD;  Location: [place];  Service: General;  Laterality: Left;  . BREAST SURGERY  10/10   benign biopsy  negative  . CESAREAN SECTION    . CHOLECYSTECTOMY    . CORONARY STENT INTERVENTION N/A 02/11/2017   Procedure: CORONARY STENT INTERVENTION;  Surgeon: Nelva Bush, MD;  Location: [place] [facility];  Service: Cardiovascular;  Laterality: N/A;  . FEMUR FRACTURE SURGERY Right 2006  . LEFT HEART CATH AND CORONARY ANGIOGRAPHY N/A 02/11/2017   Procedure: LEFT HEART CATH AND CORONARY ANGIOGRAPHY;  Surgeon: Nelva Bush, MD;  Location: [place] [facility];  Service: Cardiovascular;  Laterality: N/A;  . OVARIAN CYST SURGERY    . RE-EXCISION OF BREAST LUMPECTOMY Left 04/01/2016   Procedure: RE-EXCISION OF LEFT BREAST MEDIAL MARGIN;  Surgeon: Autumn Messing III, MD;  Location: [place];  Service: General;  Laterality: Left;   Family History  Problem Relation Age of Onset  . Hypertension Mother   . Heart disease Father   . Macular degeneration Sister   . Diabetes Maternal Aunt   . Cancer Maternal Aunt        throat   Social History   Socioeconomic History  . Marital status: Widowed    Spouse name: Not on file  . Number of  children: 1  . Years of education: Not on file  . Highest education level: Not on file  Occupational History    Employer: RETIRED  Social Needs  . Financial resource strain: Not on file  . Food insecurity:    Worry: Not on file    Inability: Not on file  . Transportation needs:    Medical: Not on file    Non-medical: Not on file  Tobacco Use  . Smoking status: Former Smoker    Last attempt to quit: 03/11/1968    Years since quitting: 50.1  . Smokeless tobacco: Never Used  Substance and Sexual Activity  . Alcohol use: No    Alcohol/week: 0.0 standard drinks  . Drug use: No  . Sexual activity: Not Currently  Lifestyle  . Physical activity:    Days per week: Not on file    Minutes per session: Not on file  . Stress: Not on file  Relationships  . Social connections:    Talks on phone: Not on file    Gets together: Not on file    Attends religious service: Not on file    Active member of club or organization: Not on file    Attends meetings of clubs or organizations: Not on file    Relationship status: Not on file  Other Topics Concern  . Not on file  Social History Narrative  . Not on file    Outpatient Encounter Medications as of 04/08/2018  Medication Sig  . aspirin (ASPIRIN EC) 81 MG EC tablet Take 81 mg by mouth daily. Swallow whole.   Marland Kitchen atorvastatin (LIPITOR) 40 MG tablet Take 1 tablet (40 mg total) by mouth daily.  . Cholecalciferol (VITAMIN D) 2000 UNITS CAPS Take 1 capsule by mouth daily.    . clopidogrel (PLAVIX) 75 MG tablet Take 1 tablet (75 mg total) by mouth daily with breakfast.  . cyanocobalamin (,VITAMIN B-12,) 1000 MCG/ML injection Inject 1,000 mcg into the muscle every 3 (three) months.  . isosorbide mononitrate (IMDUR) 30 MG 24 hr tablet TAKE ONE HALF TABLET BY MOUTH EVERY DAY  . Multiple Vitamin (MULTIVITAMIN) LIQD Take 5 mLs by mouth daily.  . nitroGLYCERIN (NITROSTAT) 0.4 MG SL tablet Place 1 tablet (0.4 mg total) under the tongue every 5 (five)  minutes x 3 doses as needed for chest pain.  Marland Kitchen omeprazole (PRILOSEC) 20 MG capsule Take 1 capsule (20 mg total) by mouth daily as needed (acid reflux).   No facility-administered encounter medications on file as of 04/08/2018.     Activities of Daily Living In your present state of health, do you have any difficulty performing the following activities: 04/08/2018 10/16/2017  Hearing? Y N  Vision? N N  Difficulty concentrating or making decisions? N N  Walking or climbing stairs? N N  Dressing or bathing?  N N  Doing errands, shopping? N N  Preparing Food and eating ? N -  Using the Toilet? N -  In the past six months, have you accidently leaked urine? Y -  Do you have problems with loss of bowel control? N -  Managing your Medications? N -  Managing your Finances? N -  Housekeeping or managing your Housekeeping? N -  Some recent data might be hidden    Patient Care Team: Tower, [name], MD as PCP - General Rockey Situ, Kathlene November, MD as PCP - Cardiology (Cardiology) Minna Merritts, MD as Consulting Physician (Cardiology) Jovita Kussmaul, MD as Consulting Physician (General Surgery) Truitt Merle, MD as Consulting Physician (Hematology) Eppie Gibson, MD as Attending Physician (Radiation Oncology) Delice Bison Charlestine Massed, NP as Nurse Practitioner (Hematology and Oncology) Wellington Hampshire, MD as Consulting Physician (Cardiology)    Assessment:   This is a routine wellness examination for [place].   Hearing Screening   125Hz  250Hz  500Hz  1000Hz  2000Hz  3000Hz  4000Hz  6000Hz  8000Hz   Right ear:   40 0 0  0    Left ear:           Comments: Hearing aid - left ear only  Vision Screening Comments: Vision exam in 2019    Exercise Activities and Dietary recommendations Current Exercise Habits: Structured exercise class, Type of exercise: strength training/weights;yoga, Time (Minutes): 60, Frequency (Times/Week): 2, Weekly Exercise (Minutes/Week): 120, Intensity: Moderate, Exercise limited by:  None identified  Goals    . Increase physical activity     Starting 04/08/2018, I will continue to exercise for 60 minutes twice weekly.        Fall Risk Fall Risk  04/08/2018 03/31/2017 02/20/2017 06/28/2016 04/17/2016  Falls in the past year? 0 No No No No   Depression Screen PHQ 2/9 Scores 04/08/2018 05/13/2017 03/31/2017 02/20/2017  PHQ - 2 Score 0 0 0 0  PHQ- 9 Score 0 0 0 1     Cognitive Function MMSE - Mini Mental State Exam 04/08/2018 03/31/2017 03/21/2016  Orientation to time 5 5 5   Orientation to Place 5 5 5   Registration 3 3 3   Attention/ Calculation 0 0 0  Recall 3 3 1   Recall-comments - - pt was unable to recall 2 of 3 words  Language- name 2 objects 0 0 0  Language- repeat 1 1 1   Language- follow 3 step command 3 3 3   Language- read & follow direction 0 0 0  Write a sentence 0 0 0  Copy design 0 0 0  Total score 20 20 18      PLEASE NOTE: A Mini-Cog screen was completed. Maximum score is 20. A value of 0 denotes this part of [place] MMSE was not completed or the patient failed this part of the Mini-Cog screening.   Mini-Cog Screening Orientation to Time - Max 5 pts Orientation to Place - Max 5 pts Registration - Max 3 pts Recall - Max 3 pts Language Repeat - Max 1 pts Language Follow 3 Step Command - Max 3 pts     Immunization History  Administered Date(s) Administered  . Influenza Split 12/11/2010, 01/17/2012  . Influenza Whole 01/09/2006, 12/10/2006, 12/08/2007, 12/13/2009  . Influenza,inj,Quad PF,6+ Mos 12/09/2012, 12/15/2013, 12/22/2014, 12/21/2015, 12/24/2016, 01/14/2018  . Pneumococcal Conjugate-13 02/23/2014  . Pneumococcal Polysaccharide-23 12/11/2010  . Td 01/10/2008    Screening Tests Health Maintenance  Topic Date Due  . TETANUS/TDAP  01/09/2018  . COLONOSCOPY  03/08/2019 (Originally 03/08/2016)  . DEXA SCAN  03/21/2025 (Originally 01/11/1999)  . MAMMOGRAM  06/10/2018  . INFLUENZA VACCINE  Completed  . PNA vac Low Risk Adult  Completed       Plan:     I have personally reviewed, addressed, and noted the following in the patient's chart:  A. Medical and social history B. Use of alcohol, tobacco or illicit drugs  C. Current medications and supplements D. Functional ability and status E.  Nutritional status F.  Physical activity G. Advance directives H. List of other physicians I.  Hospitalizations, surgeries, and ER visits in previous 12 months J.  [place] to include hearing, vision, cognitive, depression L. Referrals and appointments - none  In addition, I have reviewed and discussed with patient certain preventive protocols, quality metrics, and best practice recommendations. A written personalized care plan for preventive services as well as general preventive health recommendations were provided to patient.  See attached scanned questionnaire for additional information.   Signed,   Lindell Noe, MHA, BS, LPN Health Coach

## 2018-04-08 NOTE — Patient Instructions (Addendum)
Jenna Young , Thank you for taking time to come for your Medicare Wellness Visit. I appreciate your ongoing commitment to your health goals. Please review the following plan we discussed and let me know if I can assist you in the future.   These are the goals we discussed: Goals    . Increase physical activity     Starting 04/08/2018, I will continue to exercise for 60 minutes twice weekly.        This is a list of the screening recommended for you and due dates:  Health Maintenance  Topic Date Due  . Tetanus Vaccine  01/09/2018  . Colon Cancer Screening  03/08/2019*  . DEXA scan (bone density measurement)  03/21/2025*  . Mammogram  06/10/2018  . Flu Shot  Completed  . Pneumonia vaccines  Completed  *Topic was postponed. The date shown is not the original due date.   Preventive Care for Adults  A healthy lifestyle and preventive care can promote health and wellness. Preventive health guidelines for adults include the following key practices.  . A routine yearly physical is a good way to check with your health care provider about your health and preventive screening. It is a chance to share any concerns and updates on your health and to receive a thorough exam.  . Visit your dentist for a routine exam and preventive care every 6 months. Brush your teeth twice a day and floss once a day. Good oral hygiene prevents tooth decay and gum disease.  . The frequency of eye exams is based on your age, health, family medical history, use  of contact lenses, and other factors. Follow your health care provider's recommendations for frequency of eye exams.  . Eat a healthy diet. Foods like vegetables, fruits, whole grains, low-fat dairy products, and lean protein foods contain the nutrients you need without too many calories. Decrease your intake of foods high in solid fats, added sugars, and salt. Eat the right amount of calories for you. Get information about a proper diet from your health care  provider, if necessary.  . Regular physical exercise is one of the most important things you can do for your health. Most adults should get at least 150 minutes of moderate-intensity exercise (any activity that increases your heart rate and causes you to sweat) each week. In addition, most adults need muscle-strengthening exercises on 2 or more days a week.  Silver Sneakers may be a benefit available to you. To determine eligibility, you may visit the website: www.silversneakers.com or contact program at [phone] Mon-Fri between 8AM-8PM.   . Maintain a healthy weight. The body mass index (BMI) is a screening tool to identify possible weight problems. It provides an estimate of body fat based on height and weight. Your health care provider can find your BMI and can help you achieve or maintain a healthy weight.   For adults 20 years and older: ? A BMI below 18.5 is considered underweight. ? A BMI of 18.5 to 24.9 is normal. ? A BMI of 25 to 29.9 is considered overweight. ? A BMI of 30 and above is considered obese.   . Maintain normal blood lipids and cholesterol levels by exercising and minimizing your intake of saturated fat. Eat a balanced diet with plenty of fruit and vegetables. Blood tests for lipids and cholesterol should begin at age 54 and be repeated every 5 years. If your lipid or cholesterol levels are high, you are over 50, or you are at high  risk for heart disease, you may need your cholesterol levels checked more frequently. Ongoing high lipid and cholesterol levels should be treated with medicines if diet and exercise are not working.  . If you smoke, find out from your health care provider how to quit. If you do not use tobacco, please do not start.  . If you choose to drink alcohol, please do not consume more than 2 drinks per day. One drink is considered to be 12 ounces (355 mL) of beer, 5 ounces (148 mL) of wine, or 1.5 ounces (44 mL) of liquor.  . If you are 62-79 years  old, ask your health care provider if you should take aspirin to prevent strokes.  . Use sunscreen. Apply sunscreen liberally and repeatedly throughout the day. You should seek shade when your shadow is shorter than you. Protect yourself by wearing long sleeves, pants, a wide-brimmed hat, and sunglasses year round, whenever you are outdoors.  . Once a month, do a whole body skin exam, using a mirror to look at the skin on your back. Tell your health care provider of new moles, moles that have irregular borders, moles that are larger than a pencil eraser, or moles that have changed in shape or color.

## 2018-04-08 NOTE — Progress Notes (Signed)
PCP notes:   Health maintenance:   Abnormal screenings:   Failed - hearing   Hearing Screening   125Hz  250Hz  500Hz  1000Hz  2000Hz  3000Hz  4000Hz  6000Hz  8000Hz   Right ear:   40 0 0  0    Left ear:           Comments: Hearing aid - left ear only  Patient concerns:   None  Nurse concerns:  None  Next PCP appt:   04/15/18 @ 1030  I reviewed health advisor's note, was available for consultation, and agree with documentation and plan. Loura Pardon MD

## 2018-04-13 ENCOUNTER — Other Ambulatory Visit: Payer: Self-pay | Admitting: Family Medicine

## 2018-04-15 ENCOUNTER — Encounter: Payer: Self-pay | Admitting: Family Medicine

## 2018-04-15 ENCOUNTER — Ambulatory Visit (INDEPENDENT_AMBULATORY_CARE_PROVIDER_SITE_OTHER): Payer: Medicare Other | Admitting: Family Medicine

## 2018-04-15 VITALS — BP 130/60 | HR 71 | Temp 98.7°F | Ht 62.0 in | Wt 129.0 lb

## 2018-04-15 DIAGNOSIS — M810 Age-related osteoporosis without current pathological fracture: Secondary | ICD-10-CM | POA: Diagnosis not present

## 2018-04-15 DIAGNOSIS — E559 Vitamin D deficiency, unspecified: Secondary | ICD-10-CM

## 2018-04-15 DIAGNOSIS — R7309 Other abnormal glucose: Secondary | ICD-10-CM | POA: Diagnosis not present

## 2018-04-15 DIAGNOSIS — E538 Deficiency of other specified B group vitamins: Secondary | ICD-10-CM | POA: Diagnosis not present

## 2018-04-15 DIAGNOSIS — Z171 Estrogen receptor negative status [ER-]: Secondary | ICD-10-CM | POA: Diagnosis not present

## 2018-04-15 DIAGNOSIS — D649 Anemia, unspecified: Secondary | ICD-10-CM

## 2018-04-15 DIAGNOSIS — I10 Essential (primary) hypertension: Secondary | ICD-10-CM | POA: Diagnosis not present

## 2018-04-15 DIAGNOSIS — C50112 Malignant neoplasm of central portion of left female breast: Secondary | ICD-10-CM | POA: Diagnosis not present

## 2018-04-15 DIAGNOSIS — E782 Mixed hyperlipidemia: Secondary | ICD-10-CM | POA: Diagnosis not present

## 2018-04-15 DIAGNOSIS — R7303 Prediabetes: Secondary | ICD-10-CM | POA: Insufficient documentation

## 2018-04-15 MED ORDER — CLOPIDOGREL BISULFATE 75 MG PO TABS: 75.0000 mg | ORAL_TABLET | Freq: Every day | ORAL | 3 refills | Status: DC

## 2018-04-15 MED ORDER — CYANOCOBALAMIN 1000 MCG/ML IJ SOLN
1000.0000 ug | Freq: Once | INTRAMUSCULAR | Status: AC
  Administered 2018-04-15: 1000 ug via INTRAMUSCULAR

## 2018-04-15 MED ORDER — OMEPRAZOLE 20 MG PO CPDR: 20.0000 mg | DELAYED_RELEASE_CAPSULE | Freq: Every day | ORAL | 3 refills | Status: DC | PRN

## 2018-04-15 NOTE — Assessment & Plan Note (Signed)
Declines further eval or tx  Enc ca and D and exercise  No falls/fx this year

## 2018-04-15 NOTE — Assessment & Plan Note (Signed)
utd with mammograms  No re occurrence Doing well

## 2018-04-15 NOTE — Progress Notes (Signed)
Subjective:    Patient ID: Jenna Young, female    DOB: [birth year], 85 y.o.   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  Now wearing a hearing aide in L ear that is helpful  Colonoscopy 12/14 with 3 y recall  Not interested in more colon screening at her age   dexa - has h/o OP Declines dexas however  D level is 76 No falls  No fractures   Mammogram 4/19 neg H/o L breast cancer -is cancer free and she keeps up with this  Self breast exam -no lumps    Also due for B12 shot   Zoster status -she declines a vaccine   bp is stable today  No cp or palpitations or headaches or edema  No side effects to medicines  BP Readings from Last 3 Encounters:  04/15/18 130/60  04/08/18 (!) 150/60  10/29/17 (!) 156/66      Chronic anemia - Hb up a bit this time  Lab Results  Component Value Date   WBC 6.0 04/08/2018   HGB 10.2 (L) 04/08/2018   HCT 31.5 (L) 04/08/2018   MCV 76.2 (L) 04/08/2018   PLT 235.0 04/08/2018   Lab Results  Component Value Date   VITAMINB12 393 04/08/2018   Due for shot today   hyperlipidmia Lab Results  Component Value Date   CHOL 140 04/08/2018   CHOL 121 10/17/2017   CHOL 125 03/31/2017   Lab Results  Component Value Date   HDL 54.00 04/08/2018   HDL 53 10/17/2017   HDL 50.50 03/31/2017   Lab Results  Component Value Date   LDLCALC 66 04/08/2018   LDLCALC 51 10/17/2017   LDLCALC 56 03/31/2017   Lab Results  Component Value Date   TRIG  101.0 04/08/2018   TRIG 83 10/17/2017   TRIG 92.0 03/31/2017   Lab Results  Component Value Date   CHOLHDL 3 04/08/2018   CHOLHDL 2.3 10/17/2017   CHOLHDL 2 03/31/2017   Lab Results  Component Value Date   LDLDIRECT 116.6 01/27/2013   LDLDIRECT 114.5 12/04/2010   LDLDIRECT 117.1 04/23/2010   On statin and diet /lipitor  Fair diet   Lab Results  Component Value Date   CREATININE 0.88 04/08/2018   BUN 10 04/08/2018   NA 138 04/08/2018   K 4.7 04/08/2018   CL 103 04/08/2018   CO2 27 04/08/2018   Lab Results  Component Value Date   ALT 28 04/08/2018   AST 43 (H) 04/08/2018   ALKPHOS 91 04/08/2018   BILITOT 0.4 04/08/2018   no tylenol or etoh   Glucose 101  Lab Results  Component Value Date   TSH 2.34 04/08/2018    Patient Active Problem List   Diagnosis Date Noted  . Elevated glucose level 04/15/2018  . Malnutrition of moderate degree 10/17/2017  .  Encounter for screening mammogram for breast cancer 04/01/2017  . H/O syncope 02/28/2017  . CAD (coronary artery disease), native coronary artery 02/21/2017  . NSTEMI (non-ST elevated myocardial infarction) ([place]) 02/10/2017  . Carcinoma of central portion of left breast in female, estrogen receptor negative ([place]) 04/17/2016  . Ductal carcinoma in situ (DCIS) of left breast 02/26/2016  . Varicose vein of leg 10/09/2015  . Pedal edema 10/09/2015  . Heart murmur 10/09/2015  . History of spinal fracture 05/07/2015  . Lumbar pain 05/03/2015  . Dysthymia 09/20/2013  . Encounter for Medicare annual wellness exam 02/03/2013  . BPPV (benign paroxysmal positional vertigo) 06/15/2012  . Chronic right ear pain 03/16/2012  . Colon cancer screening 01/17/2012  . Anemia 01/17/2012  . GRIEF REACTION 08/01/2009  . Vitamin D deficiency 08/19/2008  . ANXIETY 01/29/2008  . Essential hypertension, benign 09/25/2007  . B12 deficiency 09/24/2006  . Hyperlipidemia 09/24/2006  . Venous (peripheral) insufficiency 09/24/2006  .  ALLERGIC RHINITIS 09/24/2006  . GERD 09/24/2006  . Osteoporosis 09/24/2006   Past Medical History:  Diagnosis Date  . Allergic rhinitis   . Anemia   . Anxiety   . GERD (gastroesophageal reflux disease)   . History of radiation therapy 04/29/16- 05/24/16   Left Breast 40.05 Gy in 15 fractions, Left Breast boost 10 Gy in 5 fractions.   . Hyperlipidemia   . Hypertension    treated by Dr Thurnell Garbe recently with syncope episode  . Osteopenia   . Osteoporosis   . Personal history of radiation therapy 2018   Past Surgical History:  Procedure Laterality Date  . APPENDECTOMY    . BREAST BIOPSY Left 11/29/2008  . BREAST BIOPSY Left 01/26/2016   malignant  . BREAST EXCISIONAL BIOPSY Left 12/26/2008  . BREAST LUMPECTOMY Left 02/27/2016  . BREAST LUMPECTOMY Left 04/01/2016  . BREAST LUMPECTOMY WITH RADIOACTIVE SEED LOCALIZATION Left 02/27/2016   Procedure: LEFT BREAST LUMPECTOMY WITH RADIOACTIVE SEED LOCALIZATION;  Surgeon: Autumn Messing III, MD;  Location: [facility];  Service: General;  Laterality: Left;  . BREAST SURGERY  10/10   benign biopsy  negative  . CESAREAN SECTION    . CHOLECYSTECTOMY    . CORONARY STENT INTERVENTION N/A 02/11/2017   Procedure: CORONARY STENT INTERVENTION;  Surgeon: Nelva Bush, MD;  Location: [facility];  Service: Cardiovascular;  Laterality: N/A;  . FEMUR FRACTURE SURGERY Right 2006  . LEFT HEART CATH AND CORONARY ANGIOGRAPHY N/A 02/11/2017   Procedure: LEFT HEART CATH AND CORONARY ANGIOGRAPHY;  Surgeon: Nelva Bush, MD;  Location: [facility];  Service: Cardiovascular;  Laterality: N/A;  . OVARIAN CYST SURGERY    . RE-EXCISION OF BREAST LUMPECTOMY Left 04/01/2016   Procedure: RE-EXCISION OF LEFT BREAST MEDIAL MARGIN;  Surgeon: Autumn Messing III, MD;  Location: [facility];  Service: General;  Laterality: Left;   Social History   Tobacco Use  . Smoking status: Former Smoker    Last attempt to quit:  03/11/1968    Years since quitting: 50.1  . Smokeless tobacco: Never Used  Substance Use Topics  . Alcohol use: No    Alcohol/week: 0.0 standard drinks  . Drug use: No   Family History  Problem Relation Age of Onset  . Hypertension Mother   . Heart disease Father   . Macular degeneration Sister   . Diabetes Maternal Aunt   . Cancer Maternal Aunt        throat   Allergies  Allergen Reactions  . Buspirone  Hcl     REACTION: HEART PALPITATIONS  . Codeine     REACTION: nausea and vomiting  . Hydrochlorothiazide     REACTION: syncope-possibly from dehydration  . Lansoprazole     REACTION: abd pain  . Minocycline Hcl   . Penicillins     REACTION: mouth numbness Has patient had a PCN reaction causing immediate rash, facial/tongue/throat swelling, SOB or lightheadedness with hypotension: No Has patient had a PCN reaction causing severe rash involving mucus membranes or skin necrosis: No Has patient had a PCN reaction that required hospitalization: No Has patient had a PCN reaction occurring within the last 10 years: No If all of the above answers are "NO", then may proceed with Cephalosporin use.  Marland Kitchen Phenergan [Promethazine Hcl] Other (See Comments)    Body shakes, hallucinations  . Ramipril     Cough   . Sulfa Antibiotics Nausea Only   Current Outpatient Medications on File Prior to Visit  Medication Sig Dispense Refill  . aspirin (ASPIRIN EC) 81 MG EC tablet Take 81 mg by mouth daily. Swallow whole.     Marland Kitchen atorvastatin (LIPITOR) 40 MG tablet Take 1 tablet (40 mg total) by mouth daily. 90 tablet 3  . Cholecalciferol (VITAMIN D) 2000 UNITS CAPS Take 1 capsule by mouth daily.      . cyanocobalamin (,VITAMIN B-12,) 1000 MCG/ML injection Inject 1,000 mcg into the muscle every 3 (three) months.    . isosorbide mononitrate (IMDUR) 30 MG 24 hr tablet TAKE ONE HALF TABLET BY MOUTH EVERY DAY 15 tablet 6  . Multiple Vitamin (MULTIVITAMIN) LIQD Take 5 mLs by mouth daily. 1 Bottle 2  .  nitroGLYCERIN (NITROSTAT) 0.4 MG SL tablet Place 1 tablet (0.4 mg total) under the tongue every 5 (five) minutes x 3 doses as needed for chest pain. 30 tablet 2   No current facility-administered medications on file prior to visit.      Review of Systems  Constitutional: Positive for fatigue. Negative for activity change, appetite change, fever and unexpected weight change.  HENT: Negative for congestion, ear pain, rhinorrhea, sinus pressure and sore throat.   Eyes: Negative for pain, redness and visual disturbance.  Respiratory: Negative for cough, shortness of breath and wheezing.   Cardiovascular: Negative for chest pain, palpitations and leg swelling.  Gastrointestinal: Negative for abdominal pain, blood in stool, constipation and diarrhea.  Endocrine: Negative for polydipsia and polyuria.  Genitourinary: Negative for dysuria, frequency and urgency.  Musculoskeletal: Positive for arthralgias and back pain. Negative for myalgias.       Night time leg cramps   Skin: Negative for pallor and rash.  Allergic/Immunologic: Negative for environmental allergies.  Neurological: Negative for dizziness, syncope and headaches.  Hematological: Negative for adenopathy. Does not bruise/bleed easily.  Psychiatric/Behavioral: Negative for decreased concentration and dysphoric mood. The patient is not nervous/anxious.        Objective:   Physical Exam Constitutional:      General: She is not in acute distress.    Appearance: Normal appearance. She is well-developed and normal weight.  HENT:     Head: Normocephalic and atraumatic.     Right Ear: Tympanic membrane, ear canal and external ear normal.     Left Ear: Tympanic membrane, ear canal and external ear normal.     Ears:     Comments: L hearing aide    Nose: Nose normal.     Mouth/Throat:     Mouth: Mucous membranes are moist.     Pharynx:  Oropharynx is clear.  Eyes:     General: No scleral icterus.       Right eye: No discharge.         Left eye: No discharge.     Conjunctiva/sclera: Conjunctivae normal.     Pupils: Pupils are equal, round, and reactive to light.  Neck:     Musculoskeletal: Normal range of motion and neck supple. No neck rigidity.     Thyroid: No thyromegaly.     Vascular: No carotid bruit or JVD.  Cardiovascular:     Rate and Rhythm: Normal rate and regular rhythm.     Heart sounds: Murmur present. No gallop.   Pulmonary:     Effort: Pulmonary effort is normal. No respiratory distress.     Breath sounds: Normal breath sounds. No wheezing.  Chest:     Chest wall: No tenderness.  Abdominal:     General: Bowel sounds are normal. There is no distension or abdominal bruit.     Palpations: Abdomen is soft. There is no mass.     Tenderness: There is no abdominal tenderness.  Genitourinary:    Comments: L breast-surgical change around nipple baseline Breast exam: No mass, nodules, thickening, tenderness, bulging, retraction, inflamation, nipple discharge or skin changes noted.  No axillary or clavicular LA.     Musculoskeletal: Normal range of motion.        General: No tenderness.     Right lower leg: No edema.     Left lower leg: No edema.  Lymphadenopathy:     Cervical: No cervical adenopathy.  Skin:    General: Skin is warm and dry.     Coloration: Skin is not pale.     Findings: No erythema or rash.     Comments: Solar lentigines diffusely   Neurological:     General: No focal deficit present.     Mental Status: She is alert.     Cranial Nerves: No cranial nerve deficit.     Motor: No abnormal muscle tone.     Coordination: Coordination normal.     Deep Tendon Reflexes: Reflexes are normal and symmetric. Reflexes normal.  Psychiatric:        Mood and Affect: Mood normal.           Assessment & Plan:   Problem List Items Addressed This Visit      Cardiovascular and Mediastinum   Essential hypertension, benign - Primary    bp in fair control at this time  BP Readings from Last 1  Encounters:  04/15/18 130/60   No changes needed Most recent labs reviewed  Disc lifstyle change with low sodium diet and exercise          Musculoskeletal and Integument   Osteoporosis    Declines further eval or tx  Enc ca and D and exercise  No falls/fx this year        Other   B12 deficiency    Due for B12 shot today      Relevant Medications   cyanocobalamin ((VITAMIN B-12)) injection 1,000 mcg (Completed)   Vitamin D deficiency    Vitamin D level is therapeutic with current supplementation Disc importance of this to bone and overall health       Hyperlipidemia    Disc goals for lipids and reasons to control them Rev last labs with pt Rev low sat fat diet in detail  Well controlled with statin and diet Hx of CAD  Anemia    Cbc is stable  No symptoms       Relevant Medications   cyanocobalamin ((VITAMIN B-12)) injection 1,000 mcg (Completed)   Carcinoma of central portion of left breast in female, estrogen receptor negative ([place])    utd with mammograms  No re occurrence Doing well       Elevated glucose level    101 today  Urged to take in more protein and less sweets

## 2018-04-15 NOTE — Patient Instructions (Addendum)
Try to get protein with every meal  Meat/fish/dairy/eggs/nuts or soy (nut butters)  Eat less sweets and more protein to avoid diabetes in the future  Stay active   You can try magnesium 250 mg daily over the counter for leg cramps - this can cause diarrhea   B12 shot today   Labs are stable   Take care of yourself   Don't forget to call Dr Donivan Scull office

## 2018-04-15 NOTE — Assessment & Plan Note (Signed)
Cbc is stable  No symptoms

## 2018-04-15 NOTE — Assessment & Plan Note (Signed)
bp in fair control at this time  BP Readings from Last 1 Encounters:  04/15/18 130/60   No changes needed Most recent labs reviewed  Disc lifstyle change with low sodium diet and exercise

## 2018-04-15 NOTE — Assessment & Plan Note (Signed)
Disc goals for lipids and reasons to control them Rev last labs with pt Rev low sat fat diet in detail  Well controlled with statin and diet Hx of CAD

## 2018-04-15 NOTE — Assessment & Plan Note (Signed)
101 today  Urged to take in more protein and less sweets

## 2018-04-15 NOTE — Assessment & Plan Note (Signed)
Due for B12 shot today 

## 2018-04-15 NOTE — Assessment & Plan Note (Signed)
Vitamin D level is therapeutic with current supplementation Disc importance of this to bone and overall health  

## 2018-05-05 ENCOUNTER — Telehealth: Payer: Self-pay | Admitting: Cardiovascular Disease

## 2018-05-05 NOTE — Telephone Encounter (Signed)
Pt c/o of Chest Pain: STAT if CP now or developed within 24 hours  1. Are you having CP right now? Left arm pain, no chest pain   2. Are you experiencing any other symptoms (ex. SOB, nausea, vomiting, sweating)? no  3. How long have you been experiencing CP? Just today when she woke up   4. Is your CP continuous or coming and going? Coming and going   5. Have you taken Nitroglycerin? Not today  ? Patient is currently at rehab - please try [phone]

## 2018-05-05 NOTE — Telephone Encounter (Signed)
Left message on patient's number.  Called the rehab number listed and spoke with Hoyle Sauer. Patient had already left. Said patient came in to exercise and said she had left arm pain. Patient exercised as usual and the pain did not get worse. Patient told her she had taken a nitroglycerin yesterday with relief. She let patient know she could not be seen in our office today and that she recommended patient go to the ER is pain does not go away or worsens.  Will await for patient to return call.

## 2018-05-06 NOTE — Telephone Encounter (Signed)
Pt is returning your call

## 2018-05-06 NOTE — Telephone Encounter (Signed)
Left voicemail message to call back  

## 2018-05-06 NOTE — Telephone Encounter (Signed)
Patient returning call and states that she was in rehab and had some left arm pain which concerned the Rehab nurse. The patient said her arm was aching and thought it was bursitis and she said that she was not worried about it. She denies any pain today and has no other symptoms. Reviewed signs and symptoms which she should monitor for that would require immediate evaluation in the ED. Confirmed her upcoming appointment with Dr. Rockey Situ and she had no other questions at this time. She was appreciative for the follow up call and verbalized understanding of our conversation and instructions.

## 2018-05-08 ENCOUNTER — Other Ambulatory Visit: Payer: Self-pay | Admitting: Cardiovascular Disease

## 2018-05-14 ENCOUNTER — Other Ambulatory Visit: Payer: Self-pay | Admitting: Cardiovascular Disease

## 2018-05-19 ENCOUNTER — Ambulatory Visit: Payer: Medicare Other | Admitting: Cardiovascular Disease

## 2018-05-26 NOTE — Progress Notes (Signed)
Cardiology Office Note  Date:  05/27/2018   ID:  Jenna ANCHONDO, DOB 24-Sep-1931, MRN [id number]  PCP:  Abner Greenspan, MD   Chief Complaint  Patient presents with  . Other    6 month follow up. Patient c/o chest pain. Meds reviewed verbally with patient.     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  She states that she been experiencing alternating arm pain for the past 3 weeks; reports it is worse on the right biceps area  the pain occasionally radiates to her shoulder.  She develops the symptoms when she is getting ready for bed. She takes NTG for arm pain, with relief and motrin with no relief.  Similar pain when moving the garbage can  She also complains of chronic restless leg syndrome and thinks she has LLE swelling.  Denies any chest pain during exercise She admits constipation and fatigue but denies any other complaints at this time. She has a BP cuff at home that she uses occasionally; normally her BP is within normal limits.  Today's Blood pressure 160/66 Total Chol 140/ LDL 66 CR 0.88 Glucose 101  EKG personally reviewed by myself on today's visit Shows NSR 76 bpm. LVH with QRS widening. nonspecific ST abnormality   OTHER PAST MEDICAL HISTORY REVIEWED BY ME FOR TODAY'S VISIT:  Prior episode of syncope while at the beauty parlor Had hair done, got up to a when she felt lightheaded and passed out  She did not fall or sustain any injuries from this event.   When EMS arrived patient's blood glucose was 137 and her blood pressure was 74/43 for which she received 500 cc of fluid.  In the hospital, TNT 2.48  started on  heparin, medical management No cardiac catheterization performed as numbers were trending down and she was asymptomatic Etiology of the events unclear whether this was vasovagal, arrhythmia,  Lab work did not appear to be indicative of dehydration  hospital Admission for NSTEMI , 02/10/2017 D/c 02/13/2017  Cath 1. Significant mid RCA disease with sequential, calcified 80 and 95% stenoses. Element of thrombus is likely also present. 2. 90% ostial stenosis involving small OM1 branch. 3. Mildly reduced left ventricular contraction with inferior hypokinesis (LVEF ~45%). 4. Mildly elevated left ventricular filling pressure. 5. Unsuccessful PCI to mid RCA due to inability to cross the mid RCA stenosis despite aggressive buddy wire support.  Recommendation : if she has unstable angina symptoms could considerPCI with atherectomy to mid RCA   Echo 04/4095 Systolic function was normal. The   estimated ejection fraction was in the range of 50% to 55%.   Hypokinesis of the inferior myocardium.  diagnosis of DCIS, had a biopsy bx Completed XRT   previously on amlodipine but wanted to change secondary to leg edema Started on bystolic Blood pressure continue to run high Started on HCTZ every other day  could not afford Bystolic long term   was changed to clonidine.  Presented to the emergency room December 31 2015 with near syncope , dehydration  "Fell asleep in the sun " while she was on a swing set, was difficult to arouse, was taken inside by family,  had large bowel movement   in the emergency room heart rate 62 bpm , sodium down to 129, creatinine elevated Notes indicate she was only taking clonidine once a day   PMH:   has a past medical history of Allergic rhinitis, Anemia, Anxiety, GERD (gastroesophageal reflux disease), History of radiation therapy (04/29/16- 05/24/16), Hyperlipidemia, Hypertension, Osteopenia, Osteoporosis, and Personal history of radiation therapy (2018).  PSH:    Past  Surgical History:  Procedure Laterality Date  . APPENDECTOMY    . BREAST BIOPSY Left 11/29/2008  . BREAST BIOPSY Left 01/26/2016   malignant  . BREAST EXCISIONAL BIOPSY Left 12/26/2008  . BREAST LUMPECTOMY Left 02/27/2016  . BREAST LUMPECTOMY Left 04/01/2016  . BREAST LUMPECTOMY WITH RADIOACTIVE SEED LOCALIZATION Left 02/27/2016   Procedure: LEFT BREAST LUMPECTOMY WITH RADIOACTIVE SEED LOCALIZATION;  Surgeon: Autumn Messing III, MD;  Location: [facility];  Service: General;  Laterality: Left;  . BREAST SURGERY  10/10   benign biopsy  negative  . CESAREAN SECTION    . CHOLECYSTECTOMY    . CORONARY STENT INTERVENTION N/A 02/11/2017   Procedure: CORONARY STENT INTERVENTION;  Surgeon: Nelva Bush, MD;  Location: [facility];  Service: Cardiovascular;  Laterality: N/A;  . FEMUR FRACTURE SURGERY Right 2006  . LEFT HEART CATH AND CORONARY ANGIOGRAPHY N/A 02/11/2017   Procedure: LEFT HEART CATH AND CORONARY ANGIOGRAPHY;  Surgeon: Nelva Bush, MD;  Location: [facility];  Service: Cardiovascular;  Laterality: N/A;  . OVARIAN CYST SURGERY    . RE-EXCISION OF BREAST LUMPECTOMY Left 04/01/2016   Procedure: RE-EXCISION OF LEFT BREAST MEDIAL MARGIN;  Surgeon: Autumn Messing III, MD;  Location: [facility];  Service: General;  Laterality: Left;    Current Outpatient Medications  Medication Sig Dispense Refill  . aspirin (ASPIRIN EC) 81 MG EC tablet Take 81 mg by mouth daily. Swallow whole.     Marland Kitchen atorvastatin (LIPITOR) 40 MG tablet TAKE ONE TABLET EVERY DAY 90 tablet 0  . Cholecalciferol (VITAMIN D) 2000 UNITS CAPS Take 1 capsule by mouth daily.      . clopidogrel (PLAVIX) 75 MG tablet Take 1 tablet (75 mg total) by mouth daily with breakfast. 90 tablet 3  . cyanocobalamin (,VITAMIN B-12,) 1000 MCG/ML injection Inject 1,000 mcg into the muscle every 3 (three) months.    . isosorbide mononitrate (IMDUR) 30 MG 24 hr tablet TAKE ONE HALF TABLET BY MOUTH EVERY  DAY 15 tablet 6  . Multiple Vitamin (MULTIVITAMIN) LIQD Take 5 mLs by mouth daily. 1 Bottle 2  . nitroGLYCERIN (NITROSTAT) 0.4 MG SL tablet PLACE 1 TABLET UNDER TONGUE EVERY 5 MIN AS NEEDED FOR CHEST PAIN IF NO RELIEF IN15 MIN CALL 911 (MAX 3 TABS) 25 tablet 0  . omeprazole (PRILOSEC) 20 MG capsule Take 1 capsule (20 mg total) by mouth daily as needed. 90 capsule 3   No current facility-administered medications for this visit.      Allergies:   Buspirone hcl; Codeine; Hydrochlorothiazide; Lansoprazole; Minocycline hcl; Penicillins; Phenergan [promethazine hcl]; Ramipril; and Sulfa antibiotics   Social History:  The patient  reports that she quit smoking about 50 years ago. She has never used smokeless tobacco. She reports that she does not drink alcohol or use drugs.   Family History:   family history includes Cancer in her maternal aunt; Diabetes in her maternal aunt; Heart disease in her father; Hypertension in her mother; Macular degeneration in her sister.   Review of Systems  Constitutional: Positive for  malaise/fatigue.  Eyes: Negative.   Respiratory: Negative.   Cardiovascular: Negative.   Gastrointestinal: Positive for constipation.  Genitourinary: Negative.   Musculoskeletal: Negative.        Bilateral arm pain worse on the right  Neurological: Negative.   Psychiatric/Behavioral: Negative.   All other systems reviewed and are negative.   PHYSICAL EXAM: VS:  BP (!) 160/66 (BP Location: Left Arm, Patient Position: Sitting, Cuff Size: Normal)   Pulse 76   Ht 5\' 2"  (1.575 m)   Wt 130 lb (59 kg)   BMI 23.78 kg/m  , BMI Body mass index is 23.78 kg/m.  Constitutional:  oriented to person, place, and time. No distress.  HENT:  Head: Grossly normal Eyes:  no discharge. No scleral icterus.  Neck: No JVD, no carotid bruits  Cardiovascular: Regular rate and rhythm, no murmurs appreciated Pulmonary/Chest: Clear to auscultation bilaterally, no wheezes or rails Abdominal: Soft.   no distension.  no tenderness.  Musculoskeletal: Normal range of motion Neurological:  normal muscle tone. Coordination normal. No atrophy Skin: Skin warm and dry Psychiatric: normal affect, pleasant  Recent Labs: 04/08/2018: ALT 28; BUN 10; Creatinine, Ser 0.88; Hemoglobin 10.2; Platelets 235.0; Potassium 4.7; Sodium 138; TSH 2.34    Lipid Panel Lab Results  Component Value Date   CHOL 140 04/08/2018   HDL 54.00 04/08/2018   LDLCALC 66 04/08/2018   TRIG 101.0 04/08/2018      Wt Readings from Last 3 Encounters:  05/27/18 130 lb (59 kg)  04/15/18 129 lb (58.5 kg)  04/08/18 129 lb (58.5 kg)      ASSESSMENT AND PLAN:  Chest pain Atypical in nature, likely exacerbated by over using the elliptical for 30 minutes several days per week Also affecting her arms worse on the right than the left, muscle strain Recommend she back off on the elliptical and doing more aerobic with her legs  Arm pain Right greater than left Also reproducible pain with moving the arms and moving the garbage can Likely muscle strain from overuse  Essential hypertension, benign -  Blood pressure stable, no changes made  CAD, non-STEMI Reports she is able to exercise on elliptical 30 minutes with no symptoms concerning for angina She is having musculoskeletal pain from overuse on the elliptical Recommend she back down on the use of her arm exercises  Pure hypercholesterolemia Continue Lipitor 40 daily Numbers at goal Goal LDL less than 70  Ductal carcinoma in situ (DCIS) of left breast Treatment is complete, radiation to the left  Syncope No recent episodes, no further work-up needed Possible previous vasovagal syncope  Total encounter time more than 25 minutes Greater than 50% was spent in counseling and coordination of care with the patient  Disposition: F/U  6 months   Orders Placed This Encounter  Procedures  . EKG 12-Lead    I, Heide Scales am acting as a scribe for Ida Rogue, M.D., Ph.D.  I, Ida Rogue, M.D. Ph.D., have reviewed the above documentation for accuracy and completeness, and I agree with the above.   Signed, Esmond Plants, M.D., Ph.D. 05/27/2018  [place], [place]

## 2018-05-27 ENCOUNTER — Telehealth: Payer: Self-pay | Admitting: Cardiovascular Disease

## 2018-05-27 ENCOUNTER — Encounter: Payer: Self-pay | Admitting: Cardiovascular Disease

## 2018-05-27 ENCOUNTER — Ambulatory Visit (INDEPENDENT_AMBULATORY_CARE_PROVIDER_SITE_OTHER): Payer: Medicare Other | Admitting: Cardiovascular Disease

## 2018-05-27 ENCOUNTER — Other Ambulatory Visit: Payer: Self-pay

## 2018-05-27 VITALS — BP 160/66 | HR 76 | Ht 62.0 in | Wt 130.0 lb

## 2018-05-27 DIAGNOSIS — I25118 Atherosclerotic heart disease of native coronary artery with other forms of angina pectoris: Secondary | ICD-10-CM | POA: Diagnosis not present

## 2018-05-27 NOTE — Telephone Encounter (Signed)
Spoke with patient and her daughter per release form. Patient has had chest pain with use of nitroglycerin on multiple occasions. Reviewed screening questions and appointment verified.     COVID-19 Pre-Screening Questions:  . Have you been in contact with someone that was recently sick with fever/cough or confirmed to have the [place] virus?  No  *Contact with a confirmed case should stay at home, away from confirmed patient, monitor symptoms, and reach out to PCP for e-visit/additional testing.  2. Do you have any of the following symptoms [cough, fever (100.4 or greater)], and/or shortness of breath)?  No  *ALL PTS W/ FEVER SHOULD BE REFERRED TO PCP FOR E-VISIT* ________________________________________________________________________________  Cardiac Questionnaire:    Since your last visit or hospitalization:    1. Have you been having chest pain? Yes   2. Have you been having shortness of breath? No   3. Have you been having increasing edema, wt gain, or increase in abdominal girth (pants fitting more tightly)? No   4. Have you had any passing out spells? No    *A YES to any of these questions would result in the appointment being kept.  *If all the answers to these questions are NO, we should indicate that given the current situation regarding the worldwide coronarvirus pandemic, at the recommendation of the CDC, we are looking to limit gatherings in our waiting area, and thus will reschedule their appointment beyond four weeks from today.    Confirmed appointment time with patient and daughter and given her active symptoms she will keep appointment for today with the understanding of restrictions and precautions.

## 2018-05-27 NOTE — Patient Instructions (Addendum)
Monitor blood pressure  Citracel or metamucil with miralex , ice cubes, water  Medication Instructions:  No changes  If you need a refill on your cardiac medications before your next appointment, please call your pharmacy.    Lab work: No new labs needed   If you have labs (blood work) drawn today and your tests are completely normal, you will receive your results only by: Marland Kitchen MyChart Message (if you have MyChart) OR . A paper copy in the mail If you have any lab test that is abnormal or we need to change your treatment, we will call you to review the results.   Testing/Procedures: No new testing needed   Follow-Up: At [facility], you and your health needs are our priority.  As part of our continuing mission to provide you with exceptional heart care, we have created designated Provider Care Teams.  These Care Teams include your primary Cardiologist (physician) and Advanced Practice Providers (APPs -  Physician Assistants and Nurse Practitioners) who all work together to provide you with the care you need, when you need it.  . You will need a follow up appointment in 6 months .   Please call our office 2 months in advance to schedule this appointment.    . Providers on your designated Care Team:   . Murray Hodgkins, NP . Christell Faith, PA-C . Marrianne Mood, PA-C  Any Other Special Instructions Will Be Listed Below (If Applicable).  For educational health videos Log in to : [email or web address] Or : [email or web address], password : [email or web address]

## 2018-07-13 DIAGNOSIS — Z961 Presence of intraocular lens: Secondary | ICD-10-CM | POA: Diagnosis not present

## 2018-07-13 DIAGNOSIS — H02055 Trichiasis without entropian left lower eyelid: Secondary | ICD-10-CM | POA: Diagnosis not present

## 2018-07-16 ENCOUNTER — Other Ambulatory Visit: Payer: Self-pay | Admitting: Cardiovascular Disease

## 2018-07-16 ENCOUNTER — Other Ambulatory Visit: Payer: Self-pay | Admitting: General Surgery

## 2018-07-16 DIAGNOSIS — Z853 Personal history of malignant neoplasm of breast: Secondary | ICD-10-CM

## 2018-07-22 ENCOUNTER — Ambulatory Visit (INDEPENDENT_AMBULATORY_CARE_PROVIDER_SITE_OTHER): Payer: Medicare Other

## 2018-07-22 DIAGNOSIS — E538 Deficiency of other specified B group vitamins: Secondary | ICD-10-CM | POA: Diagnosis not present

## 2018-07-22 MED ORDER — CYANOCOBALAMIN 1000 MCG/ML IJ SOLN
1000.0000 ug | Freq: Once | INTRAMUSCULAR | Status: AC
  Administered 2018-07-22: 1000 ug via INTRAMUSCULAR

## 2018-07-22 NOTE — Progress Notes (Signed)
Per orders of Dr. Glori Bickers injection of Vitamin B12 given by Diamond Nickel, RN administered to Right Deltoid IM. Patient tolerated injection well.

## 2018-08-14 ENCOUNTER — Other Ambulatory Visit: Payer: Self-pay

## 2018-08-14 ENCOUNTER — Ambulatory Visit
Admission: RE | Admit: 2018-08-14 | Discharge: 2018-08-14 | Disposition: A | Discharge status: Home / Self Care | Payer: Medicare Other | Source: Ambulatory Visit | Attending: General Surgery | Admitting: General Surgery

## 2018-08-14 DIAGNOSIS — Z853 Personal history of malignant neoplasm of breast: Secondary | ICD-10-CM

## 2018-08-14 DIAGNOSIS — R928 Other abnormal and inconclusive findings on diagnostic imaging of breast: Secondary | ICD-10-CM | POA: Diagnosis not present

## 2018-08-25 ENCOUNTER — Other Ambulatory Visit: Payer: Self-pay | Admitting: Cardiovascular Disease

## 2018-08-25 NOTE — Telephone Encounter (Signed)
I spoke with pt and she mentioned that she is needing Rx refilled for Nitro. Pt had nitro filled on 06/2018. Pt has had off and on chest discomfort pt denied chest pain and tightness. She said " It's not everyday and its just a chest discomfort and nitro helps the with the discomfort" Pt denies chest discomfort/pain today. Please advise if ok to refill nitro.

## 2018-08-25 NOTE — Telephone Encounter (Signed)
Lmovm to contact office to verify refill request.  LS sent to pharmacy 05/14/2018

## 2018-09-17 ENCOUNTER — Other Ambulatory Visit: Payer: Self-pay | Admitting: Cardiovascular Disease

## 2018-10-08 DIAGNOSIS — S0502XA Injury of conjunctiva and corneal abrasion without foreign body, left eye, initial encounter: Secondary | ICD-10-CM | POA: Diagnosis not present

## 2018-10-08 DIAGNOSIS — Z961 Presence of intraocular lens: Secondary | ICD-10-CM | POA: Diagnosis not present

## 2018-10-08 DIAGNOSIS — H02055 Trichiasis without entropian left lower eyelid: Secondary | ICD-10-CM | POA: Diagnosis not present

## 2018-10-23 ENCOUNTER — Ambulatory Visit: Payer: Medicare Other | Admitting: Family Medicine

## 2018-10-23 ENCOUNTER — Ambulatory Visit (INDEPENDENT_AMBULATORY_CARE_PROVIDER_SITE_OTHER): Payer: Medicare Other

## 2018-10-23 DIAGNOSIS — E538 Deficiency of other specified B group vitamins: Secondary | ICD-10-CM

## 2018-10-23 MED ORDER — CYANOCOBALAMIN 1000 MCG/ML IJ SOLN
1000.0000 ug | Freq: Once | INTRAMUSCULAR | Status: AC
  Administered 2018-10-23: 1000 ug via INTRAMUSCULAR

## 2018-10-23 NOTE — Progress Notes (Signed)
Per orders of Dr. Tower, injection of Vitamin B12 given by Amanda C Wagoner, RN. Administered to Left Deltoid IM.   Patient tolerated injection well.  

## 2018-11-26 ENCOUNTER — Telehealth: Payer: Self-pay | Admitting: Cardiovascular Disease

## 2018-11-26 ENCOUNTER — Ambulatory Visit: Payer: Medicare Other | Admitting: Physician Assistant

## 2018-11-26 NOTE — Telephone Encounter (Signed)
Pt c/o of Chest Pain: STAT if CP now or developed within 24 hours  1. Are you having CP right now? Unknown as daughter is calling for patient   2. Are you experiencing any other symptoms (ex. SOB, nausea, vomiting, sweating)?   Across chest down arms and in legs stressed because son finishing cancer treatments   3. How long have you been experiencing CP? Several months   4. Is your CP continuous or coming and going? Comes and goes   5. Have you taken Nitroglycerin? Frequently its helps instantly   Patient had appt 9/17 with Ryan .  Cancelled as she only wants to see Dr. Rockey Situ and does not want a virtual visit. Spoke with daughter this morning and she also declined virtual and app visit.  However after discussing virtual visit process daughter agreed to discuss with patient .  Holding 9/23 virtual with Gollan until 5 pm.   Patient scheduled to see Dr. Rockey Situ on 10/19  .     ?

## 2018-11-26 NOTE — Telephone Encounter (Signed)
Scheduled 9/21 from waitlist with Gollan at 4 pm.  lmov to call office to confirm keeping this appt.

## 2018-11-26 NOTE — Telephone Encounter (Signed)
Spoke with patients daughter per release form and she states that her mother reported some pain. Mother described it as not actual pain but that it just burns when she does anything with her arms. She no longer does the arm exercises as before but does note when she is sweeping or bringing in the garbage can she has that burning sensation. She reports that her mother suspects it was arthritis but she has been taking advil which helps it go away and then also nitroglycerin which also helps it. Reviewed precautions with nitroglycerin and advised that we have her scheduled to come in and see Dr. Rockey Situ on Monday at 4pm here in the office. She confirmed this and also listed to allow one person to accompany her due to her hard of hearing. She was very appreciative for the call back with no further questions at this time.

## 2018-11-26 NOTE — Progress Notes (Signed)
Cardiology Office Note  Date:  11/30/2018   ID:  Jenna Young, DOB August 23, 1931, MRN [id number]  PCP:  Abner Greenspan, MD   Chief Complaint  Patient presents with  . Other    Patient c/o "funny feeling" in chest. Patietn passed out 11/27/2018 her BP was 89/49 at that time. Meds reviewed verbally with patient.     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 who follows up today for her hypertension, near syncope/syncope,  stable angina  Syncope on Saturday, was in the garden, 11 AM Reports that she was carrying some pot plants outside, started to feel dizzy Made her way inside but was dizzy, finally sat down inside Sister presents with her today reports that she had syncope per the son who was there with her Had bowel  incontinence Did not hurt herself  Last syncope episode 2 years ago, does not have frequent near syncope episodes  Does does report having chronic constipation  Chest pain, chronic issue Seems to happen with any exertion Better with NTG  Mixed story, does not take nitro on a regular basis several times per month it would seem  Chronic arm weakness started in March 2020 Stop the statin on her own symptoms did not get better Arm discomfort when moving the garbage can, raising her arms for long periods.  They feel tired  chronic restless leg syndrome   EKG personally reviewed by myself on today's visit Shows NSR 90 bpm, LVH, left anterior fascicular block No change in EKG  Prior episode of syncope while at the [employer] Had hair done, got up to a when she felt lightheaded and passed out  She did not fall or sustain any injuries from this event.   When EMS arrived patient's blood glucose was 137 and her blood pressure was 74/43 for which she received  500 cc of fluid.  In the hospital, TNT 2.48  started on heparin, medical management No cardiac catheterization performed as numbers were trending down and she was asymptomatic Etiology of the events unclear whether this was vasovagal, arrhythmia,  Lab work did not appear to be indicative of dehydration  hospital Admission for NSTEMI , 02/10/2017 D/c 02/13/2017  Cath 1. Significant mid RCA disease with sequential, calcified 80 and 95% stenoses. Element of thrombus is likely also present. 2. 90% ostial stenosis involving small OM1 branch. 3. Mildly reduced left ventricular contraction with inferior hypokinesis (LVEF ~45%). 4. Mildly elevated left ventricular filling pressure. 5. Unsuccessful PCI to mid RCA due to inability to cross the mid RCA stenosis despite aggressive buddy wire support.  Recommendation : if she has unstable angina symptoms could considerPCI with atherectomy to mid RCA   Echo 0000000 Systolic function was normal. The   estimated ejection fraction was in the range of 50% to 55%.   Hypokinesis of the inferior myocardium.  diagnosis of DCIS, had a biopsy bx Completed XRT   previously on amlodipine but wanted to change secondary to leg edema Started on bystolic Blood pressure continue to run high Started on HCTZ every other day  could not afford Bystolic long term   was changed to clonidine.  Presented to the emergency room December 31 2015 with near syncope , dehydration  "Fell asleep in the sun " while she was on a  swing set, was difficult to arouse, was taken inside by family, had large bowel movement   in the emergency room heart rate 62 bpm , sodium down to 129, creatinine elevated Notes indicate she was only taking clonidine once a day   PMH:   has a past medical history of Allergic rhinitis, Anemia, Anxiety, GERD (gastroesophageal reflux disease), History of radiation therapy (04/29/16- 05/24/16), Hyperlipidemia, Hypertension, Osteopenia, Osteoporosis, and  Personal history of radiation therapy (2018).  PSH:    Past Surgical History:  Procedure Laterality Date  . APPENDECTOMY    . BREAST BIOPSY Left 11/29/2008  . BREAST BIOPSY Left 01/26/2016   malignant  . BREAST EXCISIONAL BIOPSY Left 12/26/2008  . BREAST LUMPECTOMY Left 02/27/2016  . BREAST LUMPECTOMY Left 04/01/2016  . BREAST LUMPECTOMY WITH RADIOACTIVE SEED LOCALIZATION Left 02/27/2016   Procedure: LEFT BREAST LUMPECTOMY WITH RADIOACTIVE SEED LOCALIZATION;  Surgeon: Autumn Messing III, MD;  Location: [facility];  Service: General;  Laterality: Left;  . BREAST SURGERY  10/10   benign biopsy  negative  . CESAREAN SECTION    . CHOLECYSTECTOMY    . CORONARY STENT INTERVENTION N/A 02/11/2017   Procedure: CORONARY STENT INTERVENTION;  Surgeon: Nelva Bush, MD;  Location: [facility];  Service: Cardiovascular;  Laterality: N/A;  . FEMUR FRACTURE SURGERY Right 2006  . LEFT HEART CATH AND CORONARY ANGIOGRAPHY N/A 02/11/2017   Procedure: LEFT HEART CATH AND CORONARY ANGIOGRAPHY;  Surgeon: Nelva Bush, MD;  Location: [facility];  Service: Cardiovascular;  Laterality: N/A;  . OVARIAN CYST SURGERY    . RE-EXCISION OF BREAST LUMPECTOMY Left 04/01/2016   Procedure: RE-EXCISION OF LEFT BREAST MEDIAL MARGIN;  Surgeon: Autumn Messing III, MD;  Location: [facility];  Service: General;  Laterality: Left;    Current Outpatient Medications  Medication Sig Dispense Refill  . aspirin (ASPIRIN EC) 81 MG EC tablet Take 81 mg by mouth daily. Swallow whole.     . Cholecalciferol (VITAMIN D) 2000 UNITS CAPS Take 1 capsule by mouth daily.      . clopidogrel (PLAVIX) 75 MG tablet Take 1 tablet (75 mg total) by mouth daily with breakfast. 90 tablet 3  . cyanocobalamin (,VITAMIN B-12,) 1000 MCG/ML injection Inject 1,000 mcg into the muscle every 3 (three) months.    . isosorbide mononitrate (IMDUR) 30 MG 24 hr tablet TAKE 1/2 TABLET BY MOUTH EVERY DAY 15 tablet 2  .  Multiple Vitamin (MULTIVITAMIN) LIQD Take 5 mLs by mouth daily. 1 Bottle 2  . nitroGLYCERIN (NITROSTAT) 0.4 MG SL tablet PLACE 1 TABLET UNDER TONGUE EVERY 5 MIN AS NEEDED FOR CHEST PAIN IF NO RELIEF IN15 MIN CALL 911 (MAX 3 TABS) 25 tablet 2  . omeprazole (PRILOSEC) 20 MG capsule Take 1 capsule (20 mg total) by mouth daily as needed. 90 capsule 3  . ranolazine (RANEXA) 500 MG 12 hr tablet Take 1 tablet (500 mg) by mouth twice a day x 7 days 14 tablet 0   No current facility-administered medications for this visit.      Allergies:   Buspirone hcl, Codeine, Hydrochlorothiazide, Lansoprazole, Minocycline hcl, Penicillins, Phenergan [promethazine hcl], Ramipril, and Sulfa antibiotics   Social History:  The patient  reports that she quit smoking about 50 years ago. She has never used smokeless tobacco. She reports that she does not drink alcohol or use drugs.   Family History:   family history includes Cancer in her maternal aunt; Diabetes in her maternal aunt; Heart disease in  her father; Hypertension in her mother; Macular degeneration in her sister.   Review of Systems  Constitutional: Positive for malaise/fatigue.  Eyes: Negative.   Respiratory: Negative.   Cardiovascular: Negative.   Gastrointestinal: Positive for constipation.  Genitourinary: Negative.   Musculoskeletal: Negative.        Bilateral arm pain worse on the right  Neurological: Negative.   Psychiatric/Behavioral: Negative.   All other systems reviewed and are negative.   PHYSICAL EXAM: VS:  BP (!) 158/70 (BP Location: Left Arm, Patient Position: Sitting, Cuff Size: Normal)   Pulse 90   Ht 5\' 2"  (1.575 m)   Wt 135 lb (61.2 kg)   BMI 24.69 kg/m  , BMI Body mass index is 24.69 kg/m.  Constitutional:  oriented to person, place, and time. No distress.  HENT:  Head: Grossly normal Eyes:  no discharge. No scleral icterus.  Neck: No JVD, no carotid bruits  Cardiovascular: Regular rate and rhythm, no murmurs  appreciated Pulmonary/Chest: Clear to auscultation bilaterally, no wheezes or rails Abdominal: Soft.  no distension.  no tenderness.  Musculoskeletal: Normal range of motion Neurological:  normal muscle tone. Coordination normal. No atrophy Skin: Skin warm and dry Psychiatric: normal affect, pleasant  Recent Labs: 04/08/2018: ALT 28; BUN 10; Creatinine, Ser 0.88; Hemoglobin 10.2; Platelets 235.0; Potassium 4.7; Sodium 138; TSH 2.34    Lipid Panel Lab Results  Component Value Date   CHOL 140 04/08/2018   HDL 54.00 04/08/2018   LDLCALC 66 04/08/2018   TRIG 101.0 04/08/2018      Wt Readings from Last 3 Encounters:  11/30/18 135 lb (61.2 kg)  05/27/18 130 lb (59 kg)  04/15/18 129 lb (58.5 kg)      ASSESSMENT AND PLAN:  Chest pain Chronic typical and atypical symptoms Reports that she is unable to walk very far without getting chest discomfort but only takes her nitro sparingly Offered Ranexa but she does not want additional medications at this time Finally reported that she will take a Ranexa prescription Previous attempts on the RCA unsuccessful Very sedentary at baseline, legs getting weaker  Arm pain Likely musculoskeletal, unable to exclude arthritic discomfort Started in March, previously right was greater than left Discomfort moving the garbage can in the shoulders also raising her arms up  Essential hypertension, benign -  Stable, no changes made  CAD, non-STEMI On prior clinic visit was using the elliptical Very sedentary at this time, reports that she is unable to even walk down the street, feels weak, chest discomfort Offered Ranexa and she will take the prescription but not sure if she wants additional medications Very sedentary at baseline  Pure hypercholesterolemia Took herself off the Lipitor, did not help her symptoms Will continue to hold for now  Ductal carcinoma in situ (DCIS) of left breast Treatment is complete, radiation to the  left  Syncope Recent episode of syncope, followed by bowel movement No recent episodes, last 2 years ago Recommended she stay hydrated, recognize signs of vasovagal If she has lightheadedness dizziness recommended she lay flat not sit in a chair and also recommended she try not to walk long distance as she did recently try to get into the house  Total encounter time more than 45 minutes Greater than 50% was spent in counseling and coordination of care with the patient  Disposition: F/U  6 months   Orders Placed This Encounter  Procedures  . EKG 12-Lead    Signed, Esmond Plants, M.D., Ph.D. 11/30/2018  [place]  [place], [place]

## 2018-11-30 ENCOUNTER — Other Ambulatory Visit: Payer: Self-pay

## 2018-11-30 ENCOUNTER — Ambulatory Visit (INDEPENDENT_AMBULATORY_CARE_PROVIDER_SITE_OTHER): Payer: Medicare Other | Admitting: Cardiovascular Disease

## 2018-11-30 ENCOUNTER — Encounter: Payer: Self-pay | Admitting: Cardiovascular Disease

## 2018-11-30 VITALS — BP 158/70 | HR 90 | Ht 62.0 in | Wt 135.0 lb

## 2018-11-30 DIAGNOSIS — I25118 Atherosclerotic heart disease of native coronary artery with other forms of angina pectoris: Secondary | ICD-10-CM

## 2018-11-30 DIAGNOSIS — I10 Essential (primary) hypertension: Secondary | ICD-10-CM

## 2018-11-30 DIAGNOSIS — I214 Non-ST elevation (NSTEMI) myocardial infarction: Secondary | ICD-10-CM | POA: Diagnosis not present

## 2018-11-30 DIAGNOSIS — R002 Palpitations: Secondary | ICD-10-CM | POA: Diagnosis not present

## 2018-11-30 DIAGNOSIS — R0789 Other chest pain: Secondary | ICD-10-CM | POA: Diagnosis not present

## 2018-11-30 DIAGNOSIS — E7800 Pure hypercholesterolemia, unspecified: Secondary | ICD-10-CM | POA: Diagnosis not present

## 2018-11-30 DIAGNOSIS — R079 Chest pain, unspecified: Secondary | ICD-10-CM

## 2018-11-30 DIAGNOSIS — R55 Syncope and collapse: Secondary | ICD-10-CM | POA: Diagnosis not present

## 2018-11-30 DIAGNOSIS — E782 Mixed hyperlipidemia: Secondary | ICD-10-CM | POA: Diagnosis not present

## 2018-11-30 MED ORDER — RANOLAZINE ER 500 MG PO TB12: ORAL_TABLET | ORAL | 0 refills | Status: DC

## 2018-11-30 NOTE — Patient Instructions (Addendum)
Medication Instructions:  Your physician has recommended you make the following change in your medication:   1) Start ranexa 500 mg- take 1 tablet twice a day for one week, then  2)  Increase up to 1000 mg- take 1 tablet twice a day  3) Continue to use nitro as needed  If you need a refill on your cardiac medications before your next appointment, please call your pharmacy.    Lab work: No new labs needed   If you have labs (blood work) drawn today and your tests are completely normal, you will receive your results only by: Marland Kitchen MyChart Message (if you have MyChart) OR . A paper copy in the mail If you have any lab test that is abnormal or we need to change your treatment, we will call you to review the results.   Testing/Procedures: No new testing needed   Follow-Up: At [facility], you and your health needs are our priority.  As part of our continuing mission to provide you with exceptional heart care, we have created designated Provider Care Teams.  These Care Teams include your primary Cardiologist (physician) and Advanced Practice Providers (APPs -  Physician Assistants and Nurse Practitioners) who all work together to provide you with the care you need, when you need it.  . You will need a follow up appointment in 6 months (March 2021) .   Please call our office 2 months in advance to schedule this appointment.  (Call in early January 2021 to schedule).   . Providers on your designated Care Team:   . Murray Hodgkins, NP . Christell Faith, PA-C . Marrianne Mood, PA-C  Any Other Special Instructions Will Be Listed Below (If Applicable).  For educational health videos Log in to : [email or web address] Or : [email or web address], password : [email or web address]

## 2018-12-03 ENCOUNTER — Other Ambulatory Visit: Payer: Self-pay | Admitting: *Deleted

## 2018-12-03 MED ORDER — RANOLAZINE ER 1000 MG PO TB12: 1000.0000 mg | ORAL_TABLET | Freq: Two times a day (BID) | ORAL | 11 refills | Status: DC

## 2018-12-03 NOTE — Progress Notes (Signed)
Ranexa refill (dose increase).

## 2018-12-08 DIAGNOSIS — L820 Inflamed seborrheic keratosis: Secondary | ICD-10-CM | POA: Diagnosis not present

## 2018-12-08 DIAGNOSIS — L570 Actinic keratosis: Secondary | ICD-10-CM | POA: Diagnosis not present

## 2018-12-08 DIAGNOSIS — L821 Other seborrheic keratosis: Secondary | ICD-10-CM | POA: Diagnosis not present

## 2018-12-12 ENCOUNTER — Other Ambulatory Visit: Payer: Self-pay | Admitting: Cardiovascular Disease

## 2018-12-14 ENCOUNTER — Telehealth: Payer: Self-pay | Admitting: Cardiovascular Disease

## 2018-12-14 NOTE — Telephone Encounter (Signed)
Patient would like to know if she can start back on Ranexa. Please call to discuss

## 2018-12-14 NOTE — Telephone Encounter (Signed)
Call back to patient who states she was doing well on Ranexa.  She denies any chest discomfort or "funny feelings" in her chest while on Ranexa.  Pt states she stopped taking Ranexa due to constipation.  Patient states she thought her constipation was originally caused by Ranexa, but decided constipation is unrelated to Renaxa.We discussed use of Miralax or prune juice.  Patient will restart Renaxa and contact the office if she experiences any other problems. Georgana Curio MHA RN CCM

## 2018-12-28 ENCOUNTER — Ambulatory Visit: Payer: Medicare Other | Admitting: Cardiovascular Disease

## 2018-12-28 ENCOUNTER — Other Ambulatory Visit: Payer: Self-pay | Admitting: Cardiovascular Disease

## 2018-12-29 ENCOUNTER — Ambulatory Visit (INDEPENDENT_AMBULATORY_CARE_PROVIDER_SITE_OTHER): Payer: Medicare Other

## 2018-12-29 DIAGNOSIS — Z23 Encounter for immunization: Secondary | ICD-10-CM | POA: Diagnosis not present

## 2019-01-07 DIAGNOSIS — Z961 Presence of intraocular lens: Secondary | ICD-10-CM | POA: Diagnosis not present

## 2019-01-07 DIAGNOSIS — H02055 Trichiasis without entropian left lower eyelid: Secondary | ICD-10-CM | POA: Diagnosis not present

## 2019-01-07 DIAGNOSIS — S0502XA Injury of conjunctiva and corneal abrasion without foreign body, left eye, initial encounter: Secondary | ICD-10-CM | POA: Diagnosis not present

## 2019-01-26 ENCOUNTER — Ambulatory Visit (INDEPENDENT_AMBULATORY_CARE_PROVIDER_SITE_OTHER): Payer: Medicare Other

## 2019-01-26 ENCOUNTER — Other Ambulatory Visit: Payer: Self-pay

## 2019-01-26 DIAGNOSIS — E538 Deficiency of other specified B group vitamins: Secondary | ICD-10-CM | POA: Diagnosis not present

## 2019-01-26 MED ORDER — CYANOCOBALAMIN 1000 MCG/ML IJ SOLN
1000.0000 ug | Freq: Once | INTRAMUSCULAR | Status: AC
  Administered 2019-01-26: 1000 ug via INTRAMUSCULAR

## 2019-01-26 NOTE — Progress Notes (Signed)
Per orders of Dr. Loura Pardon, injection of B12 injection given by Lurlean Nanny. Patient tolerated injection well.

## 2019-02-15 ENCOUNTER — Other Ambulatory Visit: Payer: Self-pay | Admitting: Cardiovascular Disease

## 2019-04-13 ENCOUNTER — Ambulatory Visit: Payer: Medicare Other

## 2019-04-13 ENCOUNTER — Telehealth: Payer: Self-pay | Admitting: Family Medicine

## 2019-04-13 DIAGNOSIS — E559 Vitamin D deficiency, unspecified: Secondary | ICD-10-CM

## 2019-04-13 DIAGNOSIS — M810 Age-related osteoporosis without current pathological fracture: Secondary | ICD-10-CM

## 2019-04-13 DIAGNOSIS — E782 Mixed hyperlipidemia: Secondary | ICD-10-CM

## 2019-04-13 DIAGNOSIS — D649 Anemia, unspecified: Secondary | ICD-10-CM

## 2019-04-13 DIAGNOSIS — E538 Deficiency of other specified B group vitamins: Secondary | ICD-10-CM

## 2019-04-13 DIAGNOSIS — R7309 Other abnormal glucose: Secondary | ICD-10-CM

## 2019-04-13 DIAGNOSIS — I10 Essential (primary) hypertension: Secondary | ICD-10-CM

## 2019-04-13 NOTE — Telephone Encounter (Signed)
-----   Message from Ellamae Sia sent at 04/09/2019  9:36 AM EST ----- Regarding: Lab orders for Wednesday, 2.3.21  AWV lab orders, please.

## 2019-04-14 ENCOUNTER — Other Ambulatory Visit: Payer: Self-pay

## 2019-04-14 ENCOUNTER — Ambulatory Visit (INDEPENDENT_AMBULATORY_CARE_PROVIDER_SITE_OTHER): Payer: Medicare Other

## 2019-04-14 ENCOUNTER — Other Ambulatory Visit (INDEPENDENT_AMBULATORY_CARE_PROVIDER_SITE_OTHER): Payer: Medicare Other

## 2019-04-14 DIAGNOSIS — E782 Mixed hyperlipidemia: Secondary | ICD-10-CM

## 2019-04-14 DIAGNOSIS — D649 Anemia, unspecified: Secondary | ICD-10-CM | POA: Diagnosis not present

## 2019-04-14 DIAGNOSIS — E538 Deficiency of other specified B group vitamins: Secondary | ICD-10-CM

## 2019-04-14 DIAGNOSIS — Z0000 Encounter for general adult medical examination without abnormal findings: Secondary | ICD-10-CM | POA: Diagnosis not present

## 2019-04-14 DIAGNOSIS — I10 Essential (primary) hypertension: Secondary | ICD-10-CM | POA: Diagnosis not present

## 2019-04-14 DIAGNOSIS — E559 Vitamin D deficiency, unspecified: Secondary | ICD-10-CM | POA: Diagnosis not present

## 2019-04-14 DIAGNOSIS — R7309 Other abnormal glucose: Secondary | ICD-10-CM | POA: Diagnosis not present

## 2019-04-14 DIAGNOSIS — M810 Age-related osteoporosis without current pathological fracture: Secondary | ICD-10-CM

## 2019-04-14 LAB — LIPID PANEL
Cholesterol: 200 mg/dL (ref 0–200)
HDL: 55.5 mg/dL (ref >39.00)
LDL Cholesterol: 121 mg/dL — ABNORMAL HIGH (ref 0–99)
NonHDL: 144.68
Total CHOL/HDL Ratio: 4
Triglycerides: 117 mg/dL (ref 0.0–149.0)
VLDL: 23.4 mg/dL (ref 0.0–40.0)

## 2019-04-14 LAB — CBC WITH DIFFERENTIAL/PLATELET
Basophils Absolute: 0 10*3/uL (ref 0.0–0.1)
Basophils Relative: 0.9 % (ref 0.0–3.0)
Eosinophils Absolute: 0.1 10*3/uL (ref 0.0–0.7)
Eosinophils Relative: 1.4 % (ref 0.0–5.0)
HCT: 29.8 % — ABNORMAL LOW (ref 36.0–46.0)
Hemoglobin: 9.4 g/dL — ABNORMAL LOW (ref 12.0–15.0)
Lymphocytes Relative: 40.1 % (ref 12.0–46.0)
Lymphs Abs: 2.1 10*3/uL (ref 0.7–4.0)
MCHC: 31.7 g/dL (ref 30.0–36.0)
MCV: 73 fl — ABNORMAL LOW (ref 78.0–100.0)
Monocytes Absolute: 0.5 10*3/uL (ref 0.1–1.0)
Monocytes Relative: 9.4 % (ref 3.0–12.0)
Neutro Abs: 2.6 10*3/uL (ref 1.4–7.7)
Neutrophils Relative %: 48.2 % (ref 43.0–77.0)
Platelets: 256 10*3/uL (ref 150.0–400.0)
RBC: 4.08 Mil/uL (ref 3.87–5.11)
RDW: 18 % — ABNORMAL HIGH (ref 11.5–15.5)
WBC: 5.4 10*3/uL (ref 4.0–10.5)

## 2019-04-14 LAB — COMPREHENSIVE METABOLIC PANEL
ALT: 8 U/L (ref 0–35)
AST: 16 U/L (ref 0–37)
Albumin: 4.1 g/dL (ref 3.5–5.2)
Alkaline Phosphatase: 70 U/L (ref 39–117)
BUN: 10 mg/dL (ref 6–23)
CO2: 25 mEq/L (ref 19–32)
Calcium: 9.5 mg/dL (ref 8.4–10.5)
Chloride: 103 mEq/L (ref 96–112)
Creatinine, Ser: 0.87 mg/dL (ref 0.40–1.20)
GFR: 61.84 mL/min (ref >60.00)
Glucose, Bld: 95 mg/dL (ref 70–99)
Potassium: 4.3 mEq/L (ref 3.5–5.1)
Sodium: 136 mEq/L (ref 135–145)
Total Bilirubin: 0.5 mg/dL (ref 0.2–1.2)
Total Protein: 8 g/dL (ref 6.0–8.3)

## 2019-04-14 LAB — TSH: TSH: 2.25 u[IU]/mL (ref 0.35–4.50)

## 2019-04-14 LAB — VITAMIN D 25 HYDROXY (VIT D DEFICIENCY, FRACTURES): VITD: 60.7 ng/mL (ref 30.00–100.00)

## 2019-04-14 LAB — FERRITIN: Ferritin: 4.4 ng/mL — ABNORMAL LOW (ref 10.0–291.0)

## 2019-04-14 LAB — HEMOGLOBIN A1C: Hgb A1c MFr Bld: 6.2 % (ref 4.6–6.5)

## 2019-04-14 LAB — VITAMIN B12: Vitamin B-12: 248 pg/mL (ref 211–911)

## 2019-04-14 NOTE — Progress Notes (Signed)
Subjective:   Savona Dorch is a 86 y.o. female who presents for Medicare Annual (Subsequent) preventive examination.  Review of Systems: N/A   This visit is being conducted through telemedicine via telephone at the nurse health advisor's home address due to the COVID-19 pandemic. This patient has given me verbal consent via doximity to conduct this visit, patient states they are participating from their home address. Patient and myself are on the telephone call. There is no referral for this visit. Some vital signs may be absent or patient reported.    Patient identification: identified by name, DOB, and current address   Cardiac Risk Factors include: advanced age (>14men, >44 women);hypertension;dyslipidemia     Objective:     Vitals: There were no vitals taken for this visit.  There is no height or weight on file to calculate BMI.  Advanced Directives 04/14/2019 04/08/2018 10/16/2017 10/16/2017 03/31/2017 03/01/2017 02/20/2017  Does Patient Have a Medical Advance Directive? No No No No No No No  Type of Advance Directive - - - - - - -  Does patient want to make changes to medical advance directive? - - - - - - -  Would patient like information on creating a medical advance directive? No - Patient declined No - Patient declined No - Patient declined No - Patient declined No - Patient declined No - Patient declined No - Patient declined    Tobacco Social History   Tobacco Use  Smoking Status Former Smoker   Quit date: 03/11/1968   Years since quitting: 51.1  Smokeless Tobacco Never Used     Counseling given: Not Answered   Clinical Intake:  Pre-visit preparation completed: Yes  Pain : No/denies pain     Nutritional Risks: None Diabetes: No  How often do you need to have someone help you when you read instructions, pamphlets, or other written materials from your doctor or pharmacy?: 1 - Never What is the last grade level you completed in school?: 12th  Interpreter  Needed?: No  Information entered by :: CJohnson, LPN  Past Medical History:  Diagnosis Date   Allergic rhinitis    Anemia    Anxiety    GERD (gastroesophageal reflux disease)    History of radiation therapy 04/29/16- 05/24/16   Left Breast 40.05 Gy in 15 fractions, Left Breast boost 10 Gy in 5 fractions.    Hyperlipidemia    Hypertension    treated by Dr Thurnell Garbe recently with syncope episode   Osteopenia    Osteoporosis    Personal history of radiation therapy 2018   Past Surgical History:  Procedure Laterality Date   APPENDECTOMY     BREAST BIOPSY Left 11/29/2008   BREAST BIOPSY Left 01/26/2016   malignant   BREAST EXCISIONAL BIOPSY Left 12/26/2008   BREAST LUMPECTOMY Left 02/27/2016   BREAST LUMPECTOMY Left 04/01/2016   BREAST LUMPECTOMY WITH RADIOACTIVE SEED LOCALIZATION Left 02/27/2016   Procedure: LEFT BREAST LUMPECTOMY WITH RADIOACTIVE SEED LOCALIZATION;  Surgeon: Autumn Messing III, MD;  Location: [facility];  Service: General;  Laterality: Left;   BREAST SURGERY  10/10   benign biopsy  negative   CESAREAN SECTION     CHOLECYSTECTOMY     CORONARY STENT INTERVENTION N/A 02/11/2017   Procedure: CORONARY STENT INTERVENTION;  Surgeon: Nelva Bush, MD;  Location: [facility];  Service: Cardiovascular;  Laterality: N/A;   FEMUR FRACTURE SURGERY Right 2006   LEFT HEART CATH AND CORONARY ANGIOGRAPHY N/A 02/11/2017   Procedure:  LEFT HEART CATH AND CORONARY ANGIOGRAPHY;  Surgeon: Nelva Bush, MD;  Location: [facility];  Service: Cardiovascular;  Laterality: N/A;   OVARIAN CYST SURGERY     RE-EXCISION OF BREAST LUMPECTOMY Left 04/01/2016   Procedure: RE-EXCISION OF LEFT BREAST MEDIAL MARGIN;  Surgeon: Autumn Messing III, MD;  Location: [facility];  Service: General;  Laterality: Left;   Family History  Problem Relation Age of Onset   Hypertension Mother    Heart disease Father    Macular  degeneration Sister    Diabetes Maternal Aunt    Cancer Maternal Aunt        throat   Social History   Socioeconomic History   Marital status: Widowed    Spouse name: Not on file   Number of children: 1   Years of education: Not on file   Highest education level: Not on file  Occupational History    Employer: RETIRED  Tobacco Use   Smoking status: Former Smoker    Quit date: 03/11/1968    Years since quitting: 51.1   Smokeless tobacco: Never Used  Substance and Sexual Activity   Alcohol use: No    Alcohol/week: 0.0 standard drinks   Drug use: No   Sexual activity: Not Currently  Other Topics Concern   Not on file  Social History Narrative   Not on file   Social Determinants of Health   Financial Resource Strain: Low Risk    Difficulty of Paying Living Expenses: Not hard at all  Food Insecurity: No Food Insecurity   Worried About Charity fundraiser in the Last Year: Never true   [place] in the Last Year: Never true  Transportation Needs: No Transportation Needs   Lack of Transportation (Medical): No   Lack of Transportation (Non-Medical): No  Physical Activity: Inactive   Days of Exercise per Week: 0 days   Minutes of Exercise per Session: 0 min  Stress: No Stress Concern Present   Feeling of Stress : Not at all  Social Connections:    Frequency of Communication with Friends and Family: Not on file   Frequency of Social Gatherings with Friends and Family: Not on file   Attends Religious Services: Not on file   Active Member of Clubs or Organizations: Not on file   Attends Archivist Meetings: Not on file   Marital Status: Not on file    Outpatient Encounter Medications as of 04/14/2019  Medication Sig   aspirin (ASPIRIN EC) 81 MG EC tablet Take 81 mg by mouth daily. Swallow whole.    Cholecalciferol (VITAMIN D) 2000 UNITS CAPS Take 1 capsule by mouth daily.     clopidogrel (PLAVIX) 75 MG tablet Take 1 tablet (75 mg  total) by mouth daily with breakfast.   cyanocobalamin (,VITAMIN B-12,) 1000 MCG/ML injection Inject 1,000 mcg into the muscle every 3 (three) months.   isosorbide mononitrate (IMDUR) 30 MG 24 hr tablet TAKE ONE-HALF TABLET BY MOUTH EVERY DAY   Multiple Vitamin (MULTIVITAMIN) LIQD Take 5 mLs by mouth daily.   nitroGLYCERIN (NITROSTAT) 0.4 MG SL tablet PLACE 1 TABLET UNDER TONGUE EVERY 5 MIN AS NEEDED FOR CHEST PAIN IF NO RELIEF IN15 MIN CALL 911 (MAX 3 TABS)   omeprazole (PRILOSEC) 20 MG capsule Take 1 capsule (20 mg total) by mouth daily as needed.   ranolazine (RANEXA) 1000 MG SR tablet Take 1 tablet (1,000 mg total) by mouth 2 (two) times daily.   No  facility-administered encounter medications on file as of 04/14/2019.    Activities of Daily Living In your present state of health, do you have any difficulty performing the following activities: 04/14/2019  Hearing? Y  Comment wears hearing aid in left ear  Vision? N  Difficulty concentrating or making decisions? N  Walking or climbing stairs? N  Dressing or bathing? N  Doing errands, shopping? N  Preparing Food and eating ? N  Using the Toilet? N  In the past six months, have you accidently leaked urine? Y  Comment wears a pad  Do you have problems with loss of bowel control? N  Managing your Medications? N  Managing your Finances? N  Housekeeping or managing your Housekeeping? N  Some recent data might be hidden    Patient Care Team: Tower, [name], MD as PCP - General Rockey Situ, Kathlene November, MD as PCP - Cardiology (Cardiology) Minna Merritts, MD as Consulting Physician (Cardiology) Jovita Kussmaul, MD as Consulting Physician (General Surgery) Truitt Merle, MD as Consulting Physician (Hematology) Eppie Gibson, MD as Attending Physician (Radiation Oncology) Delice Bison Charlestine Massed, NP as Nurse Practitioner (Hematology and Oncology) Wellington Hampshire, MD as Consulting Physician (Cardiology)    Assessment:   This is a routine  wellness examination for [place].  Exercise Activities and Dietary recommendations Current Exercise Habits: The patient does not participate in regular exercise at present, Exercise limited by: None identified  Goals     Increase physical activity     Starting 04/08/2018, I will continue to exercise for 60 minutes twice weekly.      Patient Stated     23/2021, I will maintain and continue medications as prescribed.        Fall Risk Fall Risk  04/14/2019 04/08/2018 03/31/2017 02/20/2017 06/28/2016  Falls in the past year? 0 0 No No No  Number falls in past yr: 0 - - - -  Injury with Fall? 0 - - - -  Risk for fall due to : Medication side effect - - - -  Follow up Falls evaluation completed;Falls prevention discussed - - - -   Is the patient's home free of loose throw rugs in walkways, pet beds, electrical cords, etc?   no      Grab bars in the bathroom? no      Handrails on the stairs?   no      Adequate lighting?   no  Timed Get Up and Go performed: N/A  Depression Screen PHQ 2/9 Scores 04/14/2019 04/08/2018 05/13/2017 03/31/2017  PHQ - 2 Score 0 0 0 0  PHQ- 9 Score 0 0 0 0     Cognitive Function MMSE - Mini Mental State Exam 04/14/2019 04/08/2018 03/31/2017 03/21/2016  Orientation to time 5 5 5 5   Orientation to Place 5 5 5 5   Registration 3 3 3 3   Attention/ Calculation 5 0 0 0  Recall 3 3 3 1   Recall-comments - - - pt was unable to recall 2 of 3 words  Language- name 2 objects - 0 0 0  Language- repeat 1 1 1 1   Language- follow 3 step command - 3 3 3   Language- read & follow direction - 0 0 0  Write a sentence - 0 0 0  Copy design - 0 0 0  Total score - 20 20 18   Mini Cog  Mini-Cog screen was completed. Maximum score is 22. A value of 0 denotes this part of the MMSE was not completed  or the patient failed this part of the Mini-Cog screening.       Immunization History  Administered Date(s) Administered   Fluad Quad(high Dose 65+) 12/29/2018   Influenza Split  12/11/2010, 01/17/2012   Influenza Whole 01/09/2006, 12/10/2006, 12/08/2007, 12/13/2009   Influenza,inj,Quad PF,6+ Mos 12/09/2012, 12/15/2013, 12/22/2014, 12/21/2015, 12/24/2016, 01/14/2018   Pneumococcal Conjugate-13 02/23/2014   Pneumococcal Polysaccharide-23 12/11/2010   Td 01/10/2008    Qualifies for Shingles Vaccine: yes, declined  Screening Tests Health Maintenance  Topic Date Due   TETANUS/TDAP  04/13/2020 (Originally 01/09/2018)   DEXA SCAN  03/21/2025 (Originally 01/11/1999)   MAMMOGRAM  08/14/2019   INFLUENZA VACCINE  Completed   PNA vac Low Risk Adult  Completed   COLONOSCOPY  Discontinued    Cancer Screenings: Lung: Low Dose CT Chest recommended if Age 48-80 years, 30 pack-year currently smoking OR have quit w/in 15years. Patient does not qualify. Breast:  Up to date on Mammogram? Yes, completed 08/14/2018   Up to date of Bone Density/Dexa? No, declined Colorectal: no longer required  Additional Screenings:  Hepatitis C Screening: N/A     Plan:    Patient will maintain and continue medications as prescribed.  I have personally reviewed and noted the following in the patients chart:    Medical and social history  Use of alcohol, tobacco or illicit drugs   Current medications and supplements  Functional ability and status  Nutritional status  Physical activity  Advanced directives  List of other physicians  Hospitalizations, surgeries, and ER visits in previous 12 months  Vitals  Screenings to include cognitive, depression, and falls  Referrals and appointments  In addition, I have reviewed and discussed with patient certain preventive protocols, quality metrics, and best practice recommendations. A written personalized care plan for preventive services as well as general preventive health recommendations were provided to patient.     Andrez Grime, LPN  [phone]

## 2019-04-14 NOTE — Patient Instructions (Signed)
[name] , Thank you for taking time to come for your Medicare Wellness Visit. I appreciate your ongoing commitment to your health goals. Please review the following plan we discussed and let me know if I can assist you in the future.   Screening recommendations/referrals: Colonoscopy: no longer required Mammogram: Up to date, completed 08/14/2018 Bone Density: declined Recommended yearly ophthalmology/optometry visit for glaucoma screening and checkup Recommended yearly dental visit for hygiene and checkup  Vaccinations: Influenza vaccine: Up to date, completed 12/29/2018 Pneumococcal vaccine: Completed series Tdap vaccine: decline Shingles vaccine: declined    Advanced directives: Advance directive discussed with you today. Even though you declined this today please call our office should you change your mind and we can give you the proper paperwork for you to fill out.  Conditions/risks identified: hypertension, hyperlipidemia  Next appointment: 04/21/2019 @ 9:30 am    Preventive Care 65 Years and Older, Female Preventive care refers to lifestyle choices and visits with your health care provider that can promote health and wellness. What does preventive care include?  A yearly physical exam. This is also called an annual well check.  Dental exams once or twice a year.  Routine eye exams. Ask your health care provider how often you should have your eyes checked.  Personal lifestyle choices, including:  Daily care of your teeth and gums.  Regular physical activity.  Eating a healthy diet.  Avoiding tobacco and drug use.  Limiting alcohol use.  Practicing safe sex.  Taking low-dose aspirin every day.  Taking vitamin and mineral supplements as recommended by your health care provider. What happens during an annual well check? The services and screenings done by your health care provider during your annual well check will depend on your age, overall health, lifestyle  risk factors, and family history of disease. Counseling  Your health care provider may ask you questions about your:  Alcohol use.  Tobacco use.  Drug use.  Emotional well-being.  Home and relationship well-being.  Sexual activity.  Eating habits.  History of falls.  Memory and ability to understand (cognition).  Work and work Statistician.  Reproductive health. Screening  You may have the following tests or measurements:  Height, weight, and BMI.  Blood pressure.  Lipid and cholesterol levels. These may be checked every 5 years, or more frequently if you are over 6 years old.  Skin check.  Lung cancer screening. You may have this screening every year starting at age 34 if you have a 30-pack-year history of smoking and currently smoke or have quit within the past 15 years.  Fecal occult blood test (FOBT) of the stool. You may have this test every year starting at age 33.  Flexible sigmoidoscopy or colonoscopy. You may have a sigmoidoscopy every 5 years or a colonoscopy every 10 years starting at age 17.  Hepatitis C blood test.  Hepatitis B blood test.  Sexually transmitted disease (STD) testing.  Diabetes screening. This is done by checking your blood sugar (glucose) after you have not eaten for a while (fasting). You may have this done every 1-3 years.  Bone density scan. This is done to screen for osteoporosis. You may have this done starting at age 15.  Mammogram. This may be done every 1-2 years. Talk to your health care provider about how often you should have regular mammograms. Talk with your health care provider about your test results, treatment options, and if necessary, the need for more tests. Vaccines  Your health care provider  may recommend certain vaccines, such as:  Influenza vaccine. This is recommended every year.  Tetanus, diphtheria, and acellular pertussis (Tdap, Td) vaccine. You may need a Td booster every 10 years.  Zoster vaccine.  You may need this after age 18.  Pneumococcal 13-valent conjugate (PCV13) vaccine. One dose is recommended after age 23.  Pneumococcal polysaccharide (PPSV23) vaccine. One dose is recommended after age 73. Talk to your health care provider about which screenings and vaccines you need and how often you need them. This information is not intended to replace advice given to you by your health care provider. Make sure you discuss any questions you have with your health care provider. Document Released: 03/24/2015 Document Revised: 11/15/2015 Document Reviewed: 12/27/2014 Elsevier Interactive Patient Education  2017 [employer] Prevention in the Home Falls can cause injuries. They can happen to people of all ages. There are many things you can do to make your home safe and to help prevent falls. What can I do on the outside of my home?  Regularly fix the edges of walkways and driveways and fix any cracks.  Remove anything that might make you trip as you walk through a door, such as a raised step or threshold.  Trim any bushes or trees on the path to your home.  Use bright outdoor lighting.  Clear any walking paths of anything that might make someone trip, such as rocks or tools.  Regularly check to see if handrails are loose or broken. Make sure that both sides of any steps have handrails.  Any raised decks and porches should have guardrails on the edges.  Have any leaves, snow, or ice cleared regularly.  Use sand or salt on walking paths during winter.  Clean up any spills in your garage right away. This includes oil or grease spills. What can I do in the bathroom?  Use night lights.  Install grab bars by the toilet and in the tub and shower. Do not use towel bars as grab bars.  Use non-skid mats or decals in the tub or shower.  If you need to sit down in the shower, use a plastic, non-slip stool.  Keep the floor dry. Clean up any water that spills on the floor as soon  as it happens.  Remove soap buildup in the tub or shower regularly.  Attach bath mats securely with double-sided non-slip rug tape.  Do not have throw rugs and other things on the floor that can make you trip. What can I do in the bedroom?  Use night lights.  Make sure that you have a light by your bed that is easy to reach.  Do not use any sheets or blankets that are too big for your bed. They should not hang down onto the floor.  Have a firm chair that has side arms. You can use this for support while you get dressed.  Do not have throw rugs and other things on the floor that can make you trip. What can I do in the kitchen?  Clean up any spills right away.  Avoid walking on wet floors.  Keep items that you use a lot in easy-to-reach places.  If you need to reach something above you, use a strong step stool that has a grab bar.  Keep electrical cords out of the way.  Do not use floor polish or wax that makes floors slippery. If you must use wax, use non-skid floor wax.  Do not have throw rugs  and other things on the floor that can make you trip. What can I do with my stairs?  Do not leave any items on the stairs.  Make sure that there are handrails on both sides of the stairs and use them. Fix handrails that are broken or loose. Make sure that handrails are as long as the stairways.  Check any carpeting to make sure that it is firmly attached to the stairs. Fix any carpet that is loose or worn.  Avoid having throw rugs at the top or bottom of the stairs. If you do have throw rugs, attach them to the floor with carpet tape.  Make sure that you have a light switch at the top of the stairs and the bottom of the stairs. If you do not have them, ask someone to add them for you. What else can I do to help prevent falls?  Wear shoes that:  Do not have high heels.  Have rubber bottoms.  Are comfortable and fit you well.  Are closed at the toe. Do not wear sandals.  If  you use a stepladder:  Make sure that it is fully opened. Do not climb a closed stepladder.  Make sure that both sides of the stepladder are locked into place.  Ask someone to hold it for you, if possible.  Clearly mark and make sure that you can see:  Any grab bars or handrails.  First and last steps.  Where the edge of each step is.  Use tools that help you move around (mobility aids) if they are needed. These include:  Canes.  Walkers.  Scooters.  Crutches.  Turn on the lights when you go into a dark area. Replace any light bulbs as soon as they burn out.  Set up your furniture so you have a clear path. Avoid moving your furniture around.  If any of your floors are uneven, fix them.  If there are any pets around you, be aware of where they are.  Review your medicines with your doctor. Some medicines can make you feel dizzy. This can increase your chance of falling. Ask your doctor what other things that you can do to help prevent falls. This information is not intended to replace advice given to you by your health care provider. Make sure you discuss any questions you have with your health care provider. Document Released: 12/22/2008 Document Revised: 08/03/2015 Document Reviewed: 04/01/2014 Elsevier Interactive Patient Education  2017 [employer].

## 2019-04-14 NOTE — Progress Notes (Signed)
PCP notes:  Health Maintenance: Dexa- declined   Abnormal Screenings: none   Patient concerns: Restless legs at night time.    Nurse concerns: none   Next PCP appt.: 04/21/2019 @ 9:30 am

## 2019-04-21 ENCOUNTER — Ambulatory Visit (INDEPENDENT_AMBULATORY_CARE_PROVIDER_SITE_OTHER): Payer: Medicare Other | Admitting: Family Medicine

## 2019-04-21 ENCOUNTER — Encounter: Payer: Self-pay | Admitting: Family Medicine

## 2019-04-21 ENCOUNTER — Other Ambulatory Visit: Payer: Self-pay

## 2019-04-21 VITALS — BP 146/78 | HR 89 | Temp 96.9°F | Ht 61.0 in | Wt 132.2 lb

## 2019-04-21 DIAGNOSIS — D649 Anemia, unspecified: Secondary | ICD-10-CM | POA: Diagnosis not present

## 2019-04-21 DIAGNOSIS — I10 Essential (primary) hypertension: Secondary | ICD-10-CM | POA: Diagnosis not present

## 2019-04-21 DIAGNOSIS — R7309 Other abnormal glucose: Secondary | ICD-10-CM | POA: Diagnosis not present

## 2019-04-21 DIAGNOSIS — E559 Vitamin D deficiency, unspecified: Secondary | ICD-10-CM

## 2019-04-21 DIAGNOSIS — K219 Gastro-esophageal reflux disease without esophagitis: Secondary | ICD-10-CM

## 2019-04-21 DIAGNOSIS — E538 Deficiency of other specified B group vitamins: Secondary | ICD-10-CM

## 2019-04-21 DIAGNOSIS — M810 Age-related osteoporosis without current pathological fracture: Secondary | ICD-10-CM | POA: Diagnosis not present

## 2019-04-21 DIAGNOSIS — I25118 Atherosclerotic heart disease of native coronary artery with other forms of angina pectoris: Secondary | ICD-10-CM | POA: Diagnosis not present

## 2019-04-21 DIAGNOSIS — E782 Mixed hyperlipidemia: Secondary | ICD-10-CM

## 2019-04-21 DIAGNOSIS — Z853 Personal history of malignant neoplasm of breast: Secondary | ICD-10-CM

## 2019-04-21 MED ORDER — CYANOCOBALAMIN 1000 MCG/ML IJ SOLN
1000.0000 ug | Freq: Once | INTRAMUSCULAR | Status: AC
  Administered 2019-04-21: 1000 ug via INTRAMUSCULAR

## 2019-04-21 MED ORDER — OMEPRAZOLE 20 MG PO CPDR: 20.0000 mg | DELAYED_RELEASE_CAPSULE | Freq: Every day | ORAL | 3 refills | Status: DC | PRN

## 2019-04-21 NOTE — Assessment & Plan Note (Signed)
Pt continues medical management  Angina occurs from exertion  Pt states she stopped ranexa due to constipation  Takes nitro prn  Exercise/activity is unfortunately limited  Continues asa and plavix

## 2019-04-21 NOTE — Assessment & Plan Note (Signed)
Lab Results  Component Value Date   VITAMINB12 248 04/14/2019   She takes B12 shots every 3 mo  Due today inj given

## 2019-04-21 NOTE — Patient Instructions (Addendum)
I need to know how your blood pressure is at home when you have taken your medicine   Check it when you are relaxed  Let us know how it is   You are prediabetic (borderline for diabetes)  Please cut back on candy  Try to get most of your carbohydrates from produce (with the exception of white potatoes)  Eat less bread/pasta/rice/snack foods/cereals/sweets and other items from the middle of the grocery store (processed carbs)   If you are interested in the new shingles vaccine (Shingrix) - call your local pharmacy to check on coverage and availability  If affordable, get on a wait list at your pharmacy to get the vaccine.  You are anemic We need to get you back to hematology  Our office will call this to set you up

## 2019-04-21 NOTE — Assessment & Plan Note (Signed)
Disc goals for lipids and reasons to control them Rev last labs with pt Rev low sat fat diet in detail Pt refuses statin (atorvastatin) due to side effect of "making her feel crazy and dizzy"  This is a new side effect  Not interested in trying other tx -enc her to d/w her cardiologist (in light of CAD with angina)

## 2019-04-21 NOTE — Assessment & Plan Note (Signed)
Vitamin D level is therapeutic with current supplementation Disc importance of this to bone and overall health Level is good on current supplementation

## 2019-04-21 NOTE — Assessment & Plan Note (Signed)
Hb has fallen below 10 (9.4 today) with fatigue with low ferritin at 4.4  Formerly saw hematology and was inst to return if Hb goes below 10  She claims that oral iron "makes her pass out" and would like to discuss other tx options ( ? IV iron)  Referral made to hematology

## 2019-04-21 NOTE — Progress Notes (Signed)
Subjective:    Patient ID: Jenna Young, female    DOB: Aug 08, 1931, 86 y.o.   MRN: [id number]  This visit occurred during the SARS-CoV-2 public health emergency.  Safety protocols were in place, including screening questions prior to the visit, additional usage of staff PPE, and extensive cleaning of exam room while observing appropriate contact time as indicated for disinfecting solutions.    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  Self breast exam -no lumps  Does not see the oncologist any more   Zoster status - never vaccinated  Unsure if interested   Had her first covid vaccine  2nd one is planned Monday   OP Refuses another dexa  Past hx of leg fracture in the past Took fosamax in the past  Vit D level 60.7   Good  Falls - none Exercise -none (due to angina)   Colonoscopy 12/14  Hypertension and h/o STEMI (CAD) She has angina on and off -cannot do much  Takes imdur  plavix Ranolazine - thinks it caused constipation so she stopped it  Nitro prn  Sees cardiology   BP Readings from Last 3 Encounters:  04/21/19 (!) 146/78  11/30/18 (!) 158/70  05/27/18 (!) 160/66   Has not taken medicine this am  Has not had breakfast yet so she did not take it   ? If cardiology permits it to be higher  Has a visit in march  Pulse Readings from Last 3 Encounters:  04/21/19 89  11/30/18 90  05/27/18 76    GERD Takes  omeprazole  No symptoms as long as she takes the medication    Vit B12 def Lab Results  Component Value Date   VITAMINB12 248 04/14/2019   Takes B12 shot every 3 months  Needs B12 shot today   Hyperlipidemia  Lab Results  Component Value Date   CHOL 200 04/14/2019   CHOL 140 04/08/2018   CHOL 121 10/17/2017   Lab Results  Component Value Date   HDL 55.50 04/14/2019   HDL 54.00 04/08/2018   HDL 53 10/17/2017   Lab Results  Component Value Date   LDLCALC 121 (H) 04/14/2019   LDLCALC 66 04/08/2018   LDLCALC 51 10/17/2017   Lab Results  Component Value Date   TRIG 117.0 04/14/2019   TRIG 101.0 04/08/2018   TRIG 83 10/17/2017   Lab Results  Component Value Date   CHOLHDL 4 04/14/2019   CHOLHDL 3 04/08/2018   CHOLHDL 2.3 10/17/2017   Lab Results  Component Value Date   LDLDIRECT 116.6 01/27/2013   LDLDIRECT 114.5 12/04/2010   LDLDIRECT 117.1 04/23/2010  not taking her lipitor (it is big and she has to cut it in 2 pieces)  Has not told Dr Rockey Situ  His last note indicated she stopped it due to "making her feel  crazy"     H/o anemia Lab Results  Component Value Date   WBC 5.4 04/14/2019   HGB 9.4 (L) 04/14/2019   HCT 29.8 (L) 04/14/2019   MCV 73.0 (L) 04/14/2019   PLT 256.0 04/14/2019   Lab Results  Component Value Date   FERRITIN 4.4 (L) 04/14/2019  she does not desire w/u  Has seen hematology in the past- indicated return for Hb under 10 Saw Dr Grayland Ormond in the past    Iron intake-none  She says that oral iron made her "pass out"    Elevated glucose level in the past Lab Results  Component Value Date   HGBA1C 6.2 04/14/2019  eating a lot of hard candy  Says she will back off  Cannot be active   Patient Active Problem List   Diagnosis Date Noted  . Elevated glucose level 04/15/2018  . Encounter for screening mammogram for breast cancer 04/01/2017  . H/O syncope 02/28/2017  . Coronary artery disease of native artery of native heart with  stable angina pectoris ([place]) 02/21/2017  . NSTEMI (non-ST elevated myocardial infarction) ([place]) 02/10/2017  . History of breast cancer 02/26/2016  . Varicose vein of leg 10/09/2015  . Pedal edema 10/09/2015  . Heart murmur 10/09/2015  . History of spinal fracture 05/07/2015  . Lumbar pain 05/03/2015  . Dysthymia 09/20/2013  . Encounter for Medicare annual wellness exam 02/03/2013  . BPPV (benign paroxysmal positional vertigo) 06/15/2012  . Anemia 01/17/2012  . Vitamin D deficiency 08/19/2008  . ANXIETY 01/29/2008  . Essential hypertension, benign 09/25/2007  . B12 deficiency 09/24/2006  . Hyperlipidemia 09/24/2006  . Venous (peripheral) insufficiency 09/24/2006  . ALLERGIC RHINITIS 09/24/2006  . GERD 09/24/2006  . Osteoporosis 09/24/2006   Past Medical History:  Diagnosis Date  . Allergic rhinitis   . Anemia   . Anxiety   . GERD (gastroesophageal reflux disease)   . History of radiation therapy 04/29/16- 05/24/16   Left Breast 40.05 Gy in 15 fractions, Left Breast boost 10 Gy in 5 fractions.   . Hyperlipidemia   . Hypertension    treated by Dr Thurnell Garbe recently with syncope episode  . Osteopenia   . Osteoporosis   . Personal history of radiation therapy 2018   Past Surgical History:  Procedure Laterality Date  . APPENDECTOMY    . BREAST BIOPSY Left 11/29/2008  . BREAST BIOPSY Left 01/26/2016   malignant  . BREAST EXCISIONAL BIOPSY Left 12/26/2008  . BREAST LUMPECTOMY Left 02/27/2016  . BREAST LUMPECTOMY Left 04/01/2016  . BREAST LUMPECTOMY WITH RADIOACTIVE SEED LOCALIZATION Left 02/27/2016   Procedure: LEFT BREAST LUMPECTOMY WITH RADIOACTIVE SEED LOCALIZATION;  Surgeon: Autumn Messing III, MD;  Location: [facility];  Service: General;  Laterality: Left;  . BREAST SURGERY  10/10   benign biopsy  negative  . CESAREAN SECTION    . CHOLECYSTECTOMY    . CORONARY STENT INTERVENTION N/A 02/11/2017   Procedure: CORONARY STENT INTERVENTION;  Surgeon: Nelva Bush, MD;  Location: [facility];  Service: Cardiovascular;  Laterality: N/A;  . FEMUR FRACTURE SURGERY Right 2006  . LEFT HEART CATH AND CORONARY ANGIOGRAPHY N/A 02/11/2017   Procedure: LEFT HEART CATH AND CORONARY ANGIOGRAPHY;  Surgeon: Nelva Bush, MD;  Location: [facility];  Service: Cardiovascular;  Laterality: N/A;  . OVARIAN CYST SURGERY    . RE-EXCISION OF BREAST LUMPECTOMY Left 04/01/2016   Procedure: RE-EXCISION OF LEFT BREAST MEDIAL MARGIN;  Surgeon: Autumn Messing III, MD;  Location: [facility];  Service: General;  Laterality: Left;   Social History   Tobacco Use  . Smoking status: Former Smoker    Quit date: 03/11/1968    Years since quitting: 51.1  . Smokeless tobacco: Never Used  Substance Use Topics  . Alcohol use: No    Alcohol/week: 0.0 standard drinks  . Drug use: No   Family History  Problem Relation Age of Onset  . Hypertension Mother   . Heart disease Father   . Macular degeneration Sister   . Diabetes Maternal Aunt   . Cancer Maternal Aunt        throat   Allergies  Allergen Reactions  . Atorvastatin     Made her confused and dizzy  . Buspirone Hcl     REACTION: HEART PALPITATIONS  . Codeine     REACTION: nausea and vomiting  . Hydrochlorothiazide     REACTION: syncope-possibly from dehydration  . Iron     Oral iron -per pt made her "pass out"   . Lansoprazole     REACTION: abd pain  . Minocycline Hcl   . Penicillins     REACTION: mouth numbness Has patient had a PCN reaction causing immediate rash, facial/tongue/throat swelling, SOB or lightheadedness with hypotension: No Has patient had a PCN reaction causing severe rash involving mucus membranes or skin necrosis: No Has patient had a PCN reaction that required hospitalization: No Has patient had a PCN reaction occurring within the last 10 years: No If all of the above answers are "NO", then may proceed with Cephalosporin use.  Marland Kitchen Phenergan [Promethazine  Hcl] Other (See Comments)    Body shakes, hallucinations  . Ramipril     Cough   . Ranexa [Ranolazine]     Constipation   . Sulfa Antibiotics Nausea Only   Current Outpatient Medications on File Prior to Visit  Medication Sig Dispense Refill  . aspirin (ASPIRIN EC) 81 MG EC tablet Take 81 mg by mouth daily. Swallow whole.     . Cholecalciferol (VITAMIN D) 2000 UNITS CAPS Take 1 capsule by mouth daily.      . clopidogrel (PLAVIX) 75 MG tablet Take 1 tablet (75 mg total) by mouth daily with breakfast. 90 tablet 3  . cyanocobalamin (,VITAMIN B-12,) 1000 MCG/ML injection Inject 1,000 mcg into the muscle every 3 (three) months.    . isosorbide mononitrate (IMDUR) 30 MG 24 hr tablet TAKE ONE-HALF TABLET BY MOUTH EVERY DAY 15 tablet 3  . Multiple Vitamin (MULTIVITAMIN) LIQD Take 5 mLs by mouth daily. 1 Bottle 2  . nitroGLYCERIN (NITROSTAT) 0.4 MG SL tablet PLACE 1 TABLET UNDER TONGUE EVERY 5 MIN AS NEEDED FOR CHEST PAIN IF NO RELIEF IN15 MIN CALL 911 (MAX 3 TABS) 25 tablet 1   No current facility-administered medications on file prior to visit.     Review of Systems  Constitutional: Positive for activity change and fatigue. Negative for appetite change, fever and unexpected weight change.  HENT: Negative for congestion, ear pain, rhinorrhea, sinus pressure and sore throat.   Eyes: Negative for pain, redness and visual disturbance.  Respiratory: Negative for cough, shortness of breath and wheezing.   Cardiovascular: Positive for chest pain. Negative for palpitations and leg swelling.  Gastrointestinal: Negative for abdominal pain, blood in stool, constipation and diarrhea.  Endocrine: Negative for polydipsia and polyuria.  Genitourinary: Negative for dysuria, frequency and urgency.  Musculoskeletal: Negative for arthralgias, back pain and myalgias.  Skin: Negative for pallor and rash.  Allergic/Immunologic:  Negative for environmental allergies.  Neurological: Positive for weakness. Negative  for dizziness, syncope, light-headedness, numbness and headaches.       Generally weak Not focal  Syncope in the past  Hematological: Negative for adenopathy. Does not bruise/bleed easily.  Psychiatric/Behavioral: Negative for decreased concentration and dysphoric mood. The patient is not nervous/anxious.        Objective:   Physical Exam Constitutional:      General: She is not in acute distress.    Appearance: Normal appearance. She is well-developed and normal weight. She is not ill-appearing or diaphoretic.  HENT:     Head: Normocephalic and atraumatic.     Right Ear: Tympanic membrane, ear canal and external ear normal.     Left Ear: Tympanic membrane, ear canal and external ear normal.     Nose: Nose normal. No congestion.     Mouth/Throat:     Mouth: Mucous membranes are moist.     Pharynx: Oropharynx is clear. No posterior oropharyngeal erythema.  Eyes:     General: No scleral icterus.    Extraocular Movements: Extraocular movements intact.     Conjunctiva/sclera: Conjunctivae normal.     Pupils: Pupils are equal, round, and reactive to light.  Neck:     Thyroid: No thyromegaly.     Vascular: No carotid bruit or JVD.  Cardiovascular:     Rate and Rhythm: Normal rate and regular rhythm.     Pulses: Normal pulses.     Heart sounds: Normal heart sounds. No gallop.   Pulmonary:     Effort: Pulmonary effort is normal. No respiratory distress.     Breath sounds: Normal breath sounds. No wheezing.     Comments: Good air exch Chest:     Chest wall: No tenderness.  Abdominal:     General: Bowel sounds are normal. There is no distension or abdominal bruit.     Palpations: Abdomen is soft. There is no mass.     Tenderness: There is no abdominal tenderness.     Hernia: No hernia is present.  Genitourinary:    Comments: Breast exam: No mass, nodules, thickening, tenderness, bulging, retraction, inflamation, nipple discharge or skin changes noted.  No axillary or clavicular  LA.     Baseline surgical changes in L breast with nipple inversion  Musculoskeletal:        General: No tenderness. Normal range of motion.     Cervical back: Normal range of motion and neck supple. No rigidity. No muscular tenderness.     Right lower leg: No edema.     Left lower leg: No edema.     Comments: No significant kyphosis   Lymphadenopathy:     Cervical: No cervical adenopathy.  Skin:    General: Skin is warm and dry.     Coloration: Skin is not pale.     Findings: No erythema or rash.     Comments: Solar lentigines diffusely   Neurological:     Mental Status: She is alert. Mental status is at baseline.     Cranial Nerves: No cranial nerve deficit.     Motor: No abnormal muscle tone.     Coordination: Coordination normal.     Gait: Gait normal.     Deep Tendon Reflexes: Reflexes are normal and symmetric. Reflexes normal.  Psychiatric:        Mood and Affect: Mood normal.        Cognition and Memory: Cognition and memory normal.     Comments:  Pleasant and talkative   Has to think before answering questions about her medications but otherwise mentally sharp            Assessment & Plan:   Problem List Items Addressed This Visit      Cardiovascular and Mediastinum   Essential hypertension, benign - Primary    bp is mildly elevated today having not taken her medication  inst her for next visit to go ahead and have breakfast and take her isosorbide  inst her to check bp and pulse at home and update [place] with result  Of note- also up at last cardiology visit  We can add tx if needed        Coronary artery disease of native artery of native heart with stable angina pectoris (HCC)    Pt continues medical management  Angina occurs from exertion  Pt states she stopped ranexa due to constipation  Takes nitro prn  Exercise/activity is unfortunately limited  Continues asa and plavix         Digestive   GERD    No symptoms as long as she continues omeprazole    Rev diet       Relevant Medications   omeprazole (PRILOSEC) 20 MG capsule     Musculoskeletal and Integument   Osteoporosis    Known OP with past fracture history (leg as well as spinal compression fracture)  Pt unfortunately declines any more imaging or tx  Has taken alendronate in the past  Vit D level is good on current supplementation  Activity/exercise is limited by CAD        Other   B12 deficiency    Lab Results  Component Value Date   VITAMINB12 248 04/14/2019   She takes B12 shots every 3 mo  Due today inj given       Vitamin D deficiency    Vitamin D level is therapeutic with current supplementation Disc importance of this to bone and overall health Level is good on current supplementation       Hyperlipidemia    Disc goals for lipids and reasons to control them Rev last labs with pt Rev low sat fat diet in detail Pt refuses statin (atorvastatin) due to side effect of "making her feel crazy and dizzy"  This is a new side effect  Not interested in trying other tx -enc her to d/w her cardiologist (in light of CAD with angina)       Anemia    Hb has fallen below 10 (9.4 today) with fatigue with low ferritin at 4.4  Formerly saw hematology and was inst to return if Hb goes below 10  She claims that oral iron "makes her pass out" and would like to discuss other tx options ( ? IV iron)  Referral made to hematology      Relevant Orders   Ambulatory referral to Hematology   History of breast cancer    L breast DCIS tx with surgery and radiation No problems  Mammogram nl on 6/20 Enc self breast exams       Elevated glucose level    Lab Results  Component Value Date   HGBA1C 6.2 04/14/2019   Discussed prediabetes disc imp of low glycemic diet and wt loss to prevent DM2  She will cut back on candy  Will continue to monitor

## 2019-04-21 NOTE — Assessment & Plan Note (Signed)
Known OP with past fracture history (leg as well as spinal compression fracture)  Pt unfortunately declines any more imaging or tx  Has taken alendronate in the past  Vit D level is good on current supplementation  Activity/exercise is limited by CAD

## 2019-04-21 NOTE — Assessment & Plan Note (Signed)
L breast DCIS tx with surgery and radiation No problems  Mammogram nl on 6/20 Enc self breast exams

## 2019-04-21 NOTE — Assessment & Plan Note (Signed)
No symptoms as long as she continues omeprazole  Rev diet

## 2019-04-21 NOTE — Assessment & Plan Note (Signed)
bp is mildly elevated today having not taken her medication  inst her for next visit to go ahead and have breakfast and take her isosorbide  inst her to check bp and pulse at home and update [place] with result  Of note- also up at last cardiology visit  We can add tx if needed

## 2019-04-21 NOTE — Assessment & Plan Note (Signed)
Lab Results  Component Value Date   HGBA1C 6.2 04/14/2019   Discussed prediabetes disc imp of low glycemic diet and wt loss to prevent DM2  She will cut back on candy  Will continue to monitor

## 2019-05-08 NOTE — Progress Notes (Signed)
Jenna Young  [phone] [phone]) [phone]  ID: Jenna Young OB: September 20, 1931  MR#: [id number]  [id number]  Patient Care Team: Abner Greenspan, MD as PCP - General Rockey Situ Kathlene November, MD as PCP - Cardiology (Cardiology) Minna Merritts, MD as Consulting Physician (Cardiology) Wellington Hampshire, MD as Consulting Physician (Cardiology) Lloyd Huger, MD as Consulting Physician (Oncology)  CHIEF COMPLAINT: Anemia, unspecified.  INTERVAL HISTORY: Patient is an 86 year old female with a past medical history significant for triple negative breast cancer who is referred for evaluation of slowly declining hemoglobin and iron stores.  She currently feels well and is asymptomatic.  She has no neurologic complaints.  She denies any recent fevers or illnesses.  She does not complain of weakness or fatigue.  She has no chest pain, shortness of breath, cough, or hemoptysis.  She denies any nausea, vomiting, constipation, or diarrhea.  She has no melena or hematochezia.  She has no urinary complaints.  Patient feels at her baseline offers no specific complaints today.  REVIEW OF SYSTEMS:   Review of Systems  Constitutional: Negative.  Negative for fever, malaise/fatigue and weight loss.  Respiratory: Negative.  Negative for cough and shortness of breath.   Cardiovascular: Negative.  Negative for chest pain and leg swelling.  Gastrointestinal: Negative.  Negative for abdominal pain, blood in stool and melena.  Genitourinary: Negative.  Negative for dysuria and hematuria.  Musculoskeletal: Negative.  Negative for back pain and myalgias.  Skin: Negative.  Negative for rash.  Neurological: Negative.  Negative for dizziness, focal weakness, weakness and headaches.  Psychiatric/Behavioral: Negative.  The patient is not nervous/anxious.     As per HPI. Otherwise, a complete review of systems is negative.  PAST MEDICAL HISTORY: Past Medical History:    Diagnosis Date  . Allergic rhinitis   . Anemia   . Anxiety   . GERD (gastroesophageal reflux disease)   . History of radiation therapy 04/29/16- 05/24/16   Left Breast 40.05 Gy in 15 fractions, Left Breast boost 10 Gy in 5 fractions.   . Hyperlipidemia   . Hypertension    treated by Dr Thurnell Garbe recently with syncope episode  . Osteopenia   . Osteoporosis   . Personal history of radiation therapy 2018    PAST SURGICAL HISTORY: Past Surgical History:  Procedure Laterality Date  . APPENDECTOMY    . BREAST BIOPSY Left 11/29/2008  . BREAST BIOPSY Left 01/26/2016   malignant  . BREAST EXCISIONAL BIOPSY Left 12/26/2008  . BREAST LUMPECTOMY Left 02/27/2016  . BREAST LUMPECTOMY Left 04/01/2016  . BREAST LUMPECTOMY WITH RADIOACTIVE SEED LOCALIZATION Left 02/27/2016   Procedure: LEFT BREAST LUMPECTOMY WITH RADIOACTIVE SEED LOCALIZATION;  Surgeon: Autumn Messing III, MD;  Location: [facility];  Service: General;  Laterality: Left;  . BREAST SURGERY  10/10   benign biopsy  negative  . CESAREAN SECTION    . CHOLECYSTECTOMY    . CORONARY STENT INTERVENTION N/A 02/11/2017   Procedure: CORONARY STENT INTERVENTION;  Surgeon: Nelva Bush, MD;  Location: [facility];  Service: Cardiovascular;  Laterality: N/A;  . FEMUR FRACTURE SURGERY Right 2006  . LEFT HEART CATH AND CORONARY ANGIOGRAPHY N/A 02/11/2017   Procedure: LEFT HEART CATH AND CORONARY ANGIOGRAPHY;  Surgeon: Nelva Bush, MD;  Location: [facility];  Service: Cardiovascular;  Laterality: N/A;  . OVARIAN CYST SURGERY    . RE-EXCISION OF BREAST LUMPECTOMY Left 04/01/2016   Procedure: RE-EXCISION OF LEFT BREAST MEDIAL MARGIN;  Surgeon:  Autumn Messing III, MD;  Location: [facility];  Service: General;  Laterality: Left;    FAMILY HISTORY: Family History  Problem Relation Age of Onset  . Hypertension Mother   . Heart disease Father   . Macular degeneration Sister   . Diabetes Maternal  Aunt   . Cancer Maternal Aunt        throat    ADVANCED DIRECTIVES (Y/N):  N  HEALTH MAINTENANCE: Social History   Tobacco Use  . Smoking status: Former Smoker    Quit date: 03/11/1968    Years since quitting: 51.2  . Smokeless tobacco: Never Used  Substance Use Topics  . Alcohol use: No    Alcohol/week: 0.0 standard drinks  . Drug use: No     Colonoscopy:  PAP:  Bone density:  Lipid panel:  Allergies  Allergen Reactions  . Atorvastatin     Made her confused and dizzy  . Buspirone Hcl     REACTION: HEART PALPITATIONS  . Codeine     REACTION: nausea and vomiting  . Hydrochlorothiazide     REACTION: syncope-possibly from dehydration  . Iron     Oral iron -per pt made her "pass out"   . Lansoprazole     REACTION: abd pain  . Minocycline Hcl   . Penicillins     REACTION: mouth numbness Has patient had a PCN reaction causing immediate rash, facial/tongue/throat swelling, SOB or lightheadedness with hypotension: No Has patient had a PCN reaction causing severe rash involving mucus membranes or skin necrosis: No Has patient had a PCN reaction that required hospitalization: No Has patient had a PCN reaction occurring within the last 10 years: No If all of the above answers are "NO", then may proceed with Cephalosporin use.  Marland Kitchen Phenergan [Promethazine Hcl] Other (See Comments)    Body shakes, hallucinations  . Ramipril     Cough   . Ranexa [Ranolazine]     Constipation   . Sulfa Antibiotics Nausea Only    Current Outpatient Medications  Medication Sig Dispense Refill  . aspirin (ASPIRIN EC) 81 MG EC tablet Take 81 mg by mouth daily. Swallow whole.     . Cholecalciferol (VITAMIN D) 2000 UNITS CAPS Take 1 capsule by mouth daily.      . clopidogrel (PLAVIX) 75 MG tablet Take 1 tablet (75 mg total) by mouth daily with breakfast. 90 tablet 3  . cyanocobalamin (,VITAMIN B-12,) 1000 MCG/ML injection Inject 1,000 mcg into the muscle every 3 (three) months.    . isosorbide  mononitrate (IMDUR) 30 MG 24 hr tablet TAKE ONE-HALF TABLET BY MOUTH EVERY DAY 15 tablet 3  . nitroGLYCERIN (NITROSTAT) 0.4 MG SL tablet PLACE 1 TABLET UNDER TONGUE EVERY 5 MIN AS NEEDED FOR CHEST PAIN IF NO RELIEF IN15 MIN CALL 911 (MAX 3 TABS) 25 tablet 1  . omeprazole (PRILOSEC) 20 MG capsule Take 1 capsule (20 mg total) by mouth daily as needed. 90 capsule 3   No current facility-administered medications for this visit.    OBJECTIVE: Vitals:   05/12/19 1137  BP: (!) 148/70  Pulse: 78  Resp: 16  Temp: (!) 95.7 F (35.4 C)  SpO2: 100%     Body mass index is 25.17 kg/m.    ECOG FS:0 - Asymptomatic  General: Well-developed, well-nourished, no acute distress. Eyes: Pink conjunctiva, anicteric sclera. HEENT: Normocephalic, moist mucous membranes. Lungs: No audible wheezing or coughing. Heart: Regular rate and rhythm. Abdomen: Soft, nontender, no obvious distention. Musculoskeletal: No edema,  cyanosis, or clubbing. Neuro: Alert, answering all questions appropriately. Cranial nerves grossly intact. Skin: No rashes or petechiae noted. Psych: Normal affect. Lymphatics: No cervical, calvicular, axillary or inguinal LAD.   LAB RESULTS:  Lab Results  Component Value Date   NA 136 04/14/2019   K 4.3 04/14/2019   CL 103 04/14/2019   CO2 25 04/14/2019   GLUCOSE 95 04/14/2019   BUN 10 04/14/2019   CREATININE 0.87 04/14/2019   CALCIUM 9.5 04/14/2019   PROT 8.0 04/14/2019   ALBUMIN 4.1 04/14/2019   AST 16 04/14/2019   ALT 8 04/14/2019   ALKPHOS 70 04/14/2019   BILITOT 0.5 04/14/2019   GFRNONAA 52 (L) 10/18/2017   GFRAA >60 10/18/2017    Lab Results  Component Value Date   WBC 6.8 05/12/2019   NEUTROABS 2.6 04/14/2019   HGB 9.4 (L) 05/12/2019   HCT 32.4 (L) 05/12/2019   MCV 75.9 (L) 05/12/2019   PLT 256 05/12/2019   Lab Results  Component Value Date   IRON 33 05/12/2019   TIBC 504 (H) 05/12/2019   IRONPCTSAT 7 (L) 05/12/2019   Lab Results  Component Value Date    FERRITIN 6 (L) 05/12/2019    STUDIES: No results found.  ASSESSMENT: Anemia, unspecified.  PLAN:   1. Anemia, unspecified: Patient's hemoglobin and iron stores are significantly reduced.  She reports she cannot tolerate oral iron supplementation, therefore will benefit from IV replacement.  B12, folate, and hemolysis labs are all either negative or within normal limits.  Return to clinic in 1 and 2 weeks to receive 510 mg IV Feraheme.  Patient will then return to clinic in 2 months with repeat laboratory, further evaluation, and continuation of treatment if necessary. 2.  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  Final pathology was noninvasive therefore she did not require chemotherapy.  Tamoxifen also would not be of benefit given the ER/PR status of her tumor.  Her most recent mammogram in June 2020 was reported as BI-RADS 2.  Patient has requested that her primary care physician continue to order to monitor her mammograms.  I spent a total of 45 minutes reviewing chart data, face-to-face evaluation with the patient, counseling and coordination of care as detailed above.  Patient expressed understanding and was in agreement with this plan. She also understands that She can call clinic at any time with any questions, concerns, or complaints.   Cancer Staging History of breast cancer Staging form: Breast, AJCC 7th Edition - Clinical stage from 01/26/2016: Stage 0 (Tis (DCIS), N0, M0) - Signed by Truitt Merle, MD on 02/26/2016 - Pathologic: Stage 0 (Tis (DCIS), N0, cM0) - Unsigned   Lloyd Huger, MD   05/12/2019 2:43 PM

## 2019-05-12 ENCOUNTER — Inpatient Hospital Stay: Payer: Medicare Other

## 2019-05-12 ENCOUNTER — Other Ambulatory Visit: Payer: Self-pay

## 2019-05-12 ENCOUNTER — Inpatient Hospital Stay: Payer: Medicare Other | Attending: Oncology | Admitting: Oncology

## 2019-05-12 ENCOUNTER — Other Ambulatory Visit: Payer: Self-pay | Admitting: *Deleted

## 2019-05-12 VITALS — BP 148/70 | HR 78 | Temp 95.7°F | Resp 16 | Wt 133.2 lb

## 2019-05-12 DIAGNOSIS — E785 Hyperlipidemia, unspecified: Secondary | ICD-10-CM | POA: Diagnosis not present

## 2019-05-12 DIAGNOSIS — Z833 Family history of diabetes mellitus: Secondary | ICD-10-CM | POA: Diagnosis not present

## 2019-05-12 DIAGNOSIS — I10 Essential (primary) hypertension: Secondary | ICD-10-CM | POA: Insufficient documentation

## 2019-05-12 DIAGNOSIS — Z87891 Personal history of nicotine dependence: Secondary | ICD-10-CM | POA: Insufficient documentation

## 2019-05-12 DIAGNOSIS — K219 Gastro-esophageal reflux disease without esophagitis: Secondary | ICD-10-CM | POA: Insufficient documentation

## 2019-05-12 DIAGNOSIS — Z8249 Family history of ischemic heart disease and other diseases of the circulatory system: Secondary | ICD-10-CM | POA: Diagnosis not present

## 2019-05-12 DIAGNOSIS — Z7982 Long term (current) use of aspirin: Secondary | ICD-10-CM | POA: Diagnosis not present

## 2019-05-12 DIAGNOSIS — M8580 Other specified disorders of bone density and structure, unspecified site: Secondary | ICD-10-CM | POA: Diagnosis not present

## 2019-05-12 DIAGNOSIS — D649 Anemia, unspecified: Secondary | ICD-10-CM

## 2019-05-12 DIAGNOSIS — Z79899 Other long term (current) drug therapy: Secondary | ICD-10-CM | POA: Insufficient documentation

## 2019-05-12 DIAGNOSIS — Z86000 Personal history of in-situ neoplasm of breast: Secondary | ICD-10-CM | POA: Diagnosis not present

## 2019-05-12 DIAGNOSIS — D509 Iron deficiency anemia, unspecified: Secondary | ICD-10-CM | POA: Insufficient documentation

## 2019-05-12 LAB — CBC
HCT: 32.4 % — ABNORMAL LOW (ref 36.0–46.0)
Hemoglobin: 9.4 g/dL — ABNORMAL LOW (ref 12.0–15.0)
MCH: 22 pg — ABNORMAL LOW (ref 26.0–34.0)
MCHC: 29 g/dL — ABNORMAL LOW (ref 30.0–36.0)
MCV: 75.9 fL — ABNORMAL LOW (ref 80.0–100.0)
Platelets: 256 K/uL (ref 150–400)
RBC: 4.27 MIL/uL (ref 3.87–5.11)
RDW: 17.7 % — ABNORMAL HIGH (ref 11.5–15.5)
WBC: 6.8 K/uL (ref 4.0–10.5)
nRBC: 0 % (ref 0.0–0.2)

## 2019-05-12 LAB — RETICULOCYTES
Immature Retic Fract: 19.2 % — ABNORMAL HIGH (ref 2.3–15.9)
RBC.: 4.31 MIL/uL (ref 3.87–5.11)
Retic Count, Absolute: 47 10*3/uL (ref 19.0–186.0)
Retic Ct Pct: 1.1 % (ref 0.4–3.1)

## 2019-05-12 LAB — LACTATE DEHYDROGENASE: LDH: 159 U/L (ref 98–192)

## 2019-05-12 LAB — IRON AND TIBC
Iron: 33 ug/dL (ref 28–170)
Saturation Ratios: 7 % — ABNORMAL LOW (ref 10.4–31.8)
TIBC: 504 ug/dL — ABNORMAL HIGH (ref 250–450)
UIBC: 471 ug/dL

## 2019-05-12 LAB — VITAMIN B12: Vitamin B-12: 366 pg/mL (ref 180–914)

## 2019-05-12 LAB — FOLATE: Folate: 11.7 ng/mL (ref >5.9)

## 2019-05-12 LAB — DAT, POLYSPECIFIC AHG (ARMC ONLY): Polyspecific AHG test: NEGATIVE

## 2019-05-12 LAB — FERRITIN: Ferritin: 6 ng/mL — ABNORMAL LOW (ref 11–307)

## 2019-05-12 MED ORDER — CLOPIDOGREL BISULFATE 75 MG PO TABS: 75.0000 mg | ORAL_TABLET | Freq: Every day | ORAL | 3 refills | Status: DC

## 2019-05-12 NOTE — Progress Notes (Signed)
Pt here for new patient appt for anemia, referred from PCP.

## 2019-05-13 LAB — HAPTOGLOBIN: Haptoglobin: 141 mg/dL (ref 41–333)

## 2019-05-13 LAB — ERYTHROPOIETIN: Erythropoietin: 43 m[IU]/mL — ABNORMAL HIGH (ref 2.6–18.5)

## 2019-05-19 ENCOUNTER — Inpatient Hospital Stay: Payer: Medicare Other

## 2019-05-19 ENCOUNTER — Other Ambulatory Visit: Payer: Self-pay

## 2019-05-19 VITALS — BP 150/71 | HR 71 | Resp 18

## 2019-05-19 DIAGNOSIS — D509 Iron deficiency anemia, unspecified: Secondary | ICD-10-CM

## 2019-05-19 MED ORDER — SODIUM CHLORIDE 0.9 % IV SOLN
Freq: Once | INTRAVENOUS | Status: AC
  Filled 2019-05-19: qty 250

## 2019-05-19 MED ORDER — SODIUM CHLORIDE 0.9 % IV SOLN
510.0000 mg | Freq: Once | INTRAVENOUS | Status: AC
  Administered 2019-05-19: 510 mg via INTRAVENOUS
  Filled 2019-05-19: qty 510

## 2019-05-19 NOTE — Progress Notes (Signed)
Pt tolerated Feraheme infusion well with no signs of complications or reaction. RN educated pt on the importance of calling the clinic if any complications or signs of reaction occur at home or if it is an emergency to call 911. Pt verbalized understanding and all questions answered at this time. VSS, pt stable for discharge.   Tinsley Lomas [employer]

## 2019-05-21 ENCOUNTER — Encounter: Payer: Self-pay | Admitting: Emergency Medicine

## 2019-05-21 ENCOUNTER — Other Ambulatory Visit: Payer: Self-pay

## 2019-05-21 ENCOUNTER — Inpatient Hospital Stay (HOSPITAL_COMMUNITY)
Admit: 2019-05-21 | Discharge: 2019-05-21 | Disposition: A | Discharge status: Home / Self Care | Payer: Medicare Other | Attending: Cardiovascular Disease | Admitting: Cardiovascular Disease

## 2019-05-21 ENCOUNTER — Inpatient Hospital Stay
Admission: EM | Admit: 2019-05-21 | Discharge: 2019-05-22 | DRG: 281 | Disposition: A | Discharge status: Home / Self Care | Payer: Medicare Other | Attending: Internal Medicine | Admitting: Internal Medicine

## 2019-05-21 ENCOUNTER — Encounter
Admission: EM | Disposition: A | Discharge status: Home / Self Care | Payer: Self-pay | Source: Home / Self Care | Attending: Internal Medicine

## 2019-05-21 ENCOUNTER — Emergency Department: Payer: Medicare Other

## 2019-05-21 DIAGNOSIS — Z880 Allergy status to penicillin: Secondary | ICD-10-CM

## 2019-05-21 DIAGNOSIS — Z885 Allergy status to narcotic agent status: Secondary | ICD-10-CM | POA: Diagnosis not present

## 2019-05-21 DIAGNOSIS — E7800 Pure hypercholesterolemia, unspecified: Secondary | ICD-10-CM | POA: Diagnosis not present

## 2019-05-21 DIAGNOSIS — I214 Non-ST elevation (NSTEMI) myocardial infarction: Secondary | ICD-10-CM

## 2019-05-21 DIAGNOSIS — I252 Old myocardial infarction: Secondary | ICD-10-CM

## 2019-05-21 DIAGNOSIS — I429 Cardiomyopathy, unspecified: Secondary | ICD-10-CM | POA: Diagnosis present

## 2019-05-21 DIAGNOSIS — I10 Essential (primary) hypertension: Secondary | ICD-10-CM | POA: Diagnosis present

## 2019-05-21 DIAGNOSIS — I213 ST elevation (STEMI) myocardial infarction of unspecified site: Secondary | ICD-10-CM | POA: Diagnosis not present

## 2019-05-21 DIAGNOSIS — K219 Gastro-esophageal reflux disease without esophagitis: Secondary | ICD-10-CM | POA: Diagnosis present

## 2019-05-21 DIAGNOSIS — E538 Deficiency of other specified B group vitamins: Secondary | ICD-10-CM | POA: Diagnosis present

## 2019-05-21 DIAGNOSIS — I447 Left bundle-branch block, unspecified: Secondary | ICD-10-CM | POA: Diagnosis present

## 2019-05-21 DIAGNOSIS — Z7982 Long term (current) use of aspirin: Secondary | ICD-10-CM

## 2019-05-21 DIAGNOSIS — Z955 Presence of coronary angioplasty implant and graft: Secondary | ICD-10-CM

## 2019-05-21 DIAGNOSIS — E785 Hyperlipidemia, unspecified: Secondary | ICD-10-CM | POA: Diagnosis present

## 2019-05-21 DIAGNOSIS — M810 Age-related osteoporosis without current pathological fracture: Secondary | ICD-10-CM | POA: Diagnosis present

## 2019-05-21 DIAGNOSIS — Z87891 Personal history of nicotine dependence: Secondary | ICD-10-CM | POA: Diagnosis not present

## 2019-05-21 DIAGNOSIS — Z8249 Family history of ischemic heart disease and other diseases of the circulatory system: Secondary | ICD-10-CM | POA: Diagnosis not present

## 2019-05-21 DIAGNOSIS — R079 Chest pain, unspecified: Secondary | ICD-10-CM | POA: Diagnosis present

## 2019-05-21 DIAGNOSIS — I2582 Chronic total occlusion of coronary artery: Secondary | ICD-10-CM | POA: Diagnosis present

## 2019-05-21 DIAGNOSIS — Z923 Personal history of irradiation: Secondary | ICD-10-CM

## 2019-05-21 DIAGNOSIS — Z20822 Contact with and (suspected) exposure to covid-19: Secondary | ICD-10-CM | POA: Diagnosis present

## 2019-05-21 DIAGNOSIS — I160 Hypertensive urgency: Secondary | ICD-10-CM | POA: Diagnosis not present

## 2019-05-21 DIAGNOSIS — Z833 Family history of diabetes mellitus: Secondary | ICD-10-CM | POA: Diagnosis not present

## 2019-05-21 DIAGNOSIS — Z79899 Other long term (current) drug therapy: Secondary | ICD-10-CM | POA: Diagnosis not present

## 2019-05-21 DIAGNOSIS — Z888 Allergy status to other drugs, medicaments and biological substances status: Secondary | ICD-10-CM | POA: Diagnosis not present

## 2019-05-21 DIAGNOSIS — I200 Unstable angina: Secondary | ICD-10-CM | POA: Diagnosis not present

## 2019-05-21 DIAGNOSIS — Z7902 Long term (current) use of antithrombotics/antiplatelets: Secondary | ICD-10-CM

## 2019-05-21 DIAGNOSIS — D509 Iron deficiency anemia, unspecified: Secondary | ICD-10-CM | POA: Diagnosis present

## 2019-05-21 DIAGNOSIS — I2510 Atherosclerotic heart disease of native coronary artery without angina pectoris: Secondary | ICD-10-CM | POA: Diagnosis present

## 2019-05-21 DIAGNOSIS — I159 Secondary hypertension, unspecified: Secondary | ICD-10-CM

## 2019-05-21 DIAGNOSIS — R0789 Other chest pain: Secondary | ICD-10-CM | POA: Diagnosis not present

## 2019-05-21 HISTORY — PX: LEFT HEART CATH AND CORONARY ANGIOGRAPHY: CATH118249

## 2019-05-21 LAB — COMPREHENSIVE METABOLIC PANEL
ALT: 15 U/L (ref 0–44)
AST: 26 U/L (ref 15–41)
Albumin: 4.2 g/dL (ref 3.5–5.0)
Alkaline Phosphatase: 73 U/L (ref 38–126)
Anion gap: 10 (ref 5–15)
BUN: 7 mg/dL — ABNORMAL LOW (ref 8–23)
CO2: 22 mmol/L (ref 22–32)
Calcium: 9.3 mg/dL (ref 8.9–10.3)
Chloride: 102 mmol/L (ref 98–111)
Creatinine, Ser: 0.87 mg/dL (ref 0.44–1.00)
GFR calc Af Amer: >60 mL/min (ref >60)
GFR calc non Af Amer: >60 mL/min (ref >60)
Glucose, Bld: 115 mg/dL — ABNORMAL HIGH (ref 70–99)
Potassium: 3.5 mmol/L (ref 3.5–5.1)
Sodium: 134 mmol/L — ABNORMAL LOW (ref 135–145)
Total Bilirubin: 0.8 mg/dL (ref 0.3–1.2)
Total Protein: 8.8 g/dL — ABNORMAL HIGH (ref 6.5–8.1)

## 2019-05-21 LAB — APTT: aPTT: 29 seconds (ref 24–36)

## 2019-05-21 LAB — CBC WITH DIFFERENTIAL/PLATELET
Abs Immature Granulocytes: 0.02 10*3/uL (ref 0.00–0.07)
Basophils Absolute: 0 10*3/uL (ref 0.0–0.1)
Basophils Relative: 1 %
Eosinophils Absolute: 0.1 10*3/uL (ref 0.0–0.5)
Eosinophils Relative: 2 %
HCT: 31.9 % — ABNORMAL LOW (ref 36.0–46.0)
Hemoglobin: 9.6 g/dL — ABNORMAL LOW (ref 12.0–15.0)
Immature Granulocytes: 0 %
Lymphocytes Relative: 43 %
Lymphs Abs: 2.5 10*3/uL (ref 0.7–4.0)
MCH: 22.5 pg — ABNORMAL LOW (ref 26.0–34.0)
MCHC: 30.1 g/dL (ref 30.0–36.0)
MCV: 74.7 fL — ABNORMAL LOW (ref 80.0–100.0)
Monocytes Absolute: 0.5 10*3/uL (ref 0.1–1.0)
Monocytes Relative: 8 %
Neutro Abs: 2.8 10*3/uL (ref 1.7–7.7)
Neutrophils Relative %: 46 %
Platelets: 224 10*3/uL (ref 150–400)
RBC: 4.27 MIL/uL (ref 3.87–5.11)
RDW: 17.8 % — ABNORMAL HIGH (ref 11.5–15.5)
WBC: 5.9 10*3/uL (ref 4.0–10.5)
nRBC: 0 % (ref 0.0–0.2)

## 2019-05-21 LAB — BASIC METABOLIC PANEL
Anion gap: 8 (ref 5–15)
BUN: 6 mg/dL — ABNORMAL LOW (ref 8–23)
CO2: 23 mmol/L (ref 22–32)
Calcium: 8.9 mg/dL (ref 8.9–10.3)
Chloride: 105 mmol/L (ref 98–111)
Creatinine, Ser: 0.81 mg/dL (ref 0.44–1.00)
GFR calc Af Amer: >60 mL/min (ref >60)
GFR calc non Af Amer: >60 mL/min (ref >60)
Glucose, Bld: 111 mg/dL — ABNORMAL HIGH (ref 70–99)
Potassium: 4.7 mmol/L (ref 3.5–5.1)
Sodium: 136 mmol/L (ref 135–145)

## 2019-05-21 LAB — RESPIRATORY PANEL BY RT PCR (FLU A&B, COVID)
Influenza A by PCR: NEGATIVE
Influenza B by PCR: NEGATIVE
SARS Coronavirus 2 by RT PCR: NEGATIVE

## 2019-05-21 LAB — TROPONIN I (HIGH SENSITIVITY)
Troponin I (High Sensitivity): 13 ng/L (ref <18)
Troponin I (High Sensitivity): 71 ng/L — ABNORMAL HIGH (ref <18)
Troponin I (High Sensitivity): 1084 ng/L (ref <18)
Troponin I (High Sensitivity): 3104 ng/L (ref <18)

## 2019-05-21 LAB — PROTIME-INR
INR: 1.1 (ref 0.8–1.2)
Prothrombin Time: 14 seconds (ref 11.4–15.2)

## 2019-05-21 LAB — HEPARIN LEVEL (UNFRACTIONATED): Heparin Unfractionated: <0.1 IU/mL — ABNORMAL LOW (ref 0.30–0.70)

## 2019-05-21 LAB — MAGNESIUM: Magnesium: 2 mg/dL (ref 1.7–2.4)

## 2019-05-21 SURGERY — LEFT HEART CATH AND CORONARY ANGIOGRAPHY
Anesthesia: Moderate Sedation

## 2019-05-21 MED ORDER — HEPARIN SODIUM (PORCINE) 1000 UNIT/ML IJ SOLN
INTRAMUSCULAR | Status: AC
  Filled 2019-05-21: qty 1

## 2019-05-21 MED ORDER — SODIUM CHLORIDE 0.9 % IV SOLN: INTRAVENOUS | Status: DC

## 2019-05-21 MED ORDER — MORPHINE SULFATE (PF) 2 MG/ML IV SOLN: 2.0000 mg | INTRAVENOUS | Status: DC | PRN

## 2019-05-21 MED ORDER — LABETALOL HCL 5 MG/ML IV SOLN: 10.0000 mg | INTRAVENOUS | Status: AC | PRN

## 2019-05-21 MED ORDER — VITAMIN D 25 MCG (1000 UNIT) PO TABS
1000.0000 [IU] | ORAL_TABLET | Freq: Every day | ORAL | Status: DC
  Administered 2019-05-21 – 2019-05-22 (×2): 1000 [IU] via ORAL
  Filled 2019-05-21 (×2): qty 1

## 2019-05-21 MED ORDER — ZOLPIDEM TARTRATE 5 MG PO TABS: 5.0000 mg | ORAL_TABLET | Freq: Every evening | ORAL | Status: DC | PRN

## 2019-05-21 MED ORDER — IOHEXOL 350 MG/ML SOLN
100.0000 mL | Freq: Once | INTRAVENOUS | Status: AC | PRN
  Administered 2019-05-21: 100 mL via INTRAVENOUS

## 2019-05-21 MED ORDER — SODIUM CHLORIDE 0.9% FLUSH
3.0000 mL | Freq: Two times a day (BID) | INTRAVENOUS | Status: DC
  Administered 2019-05-21 – 2019-05-22 (×2): 3 mL via INTRAVENOUS

## 2019-05-21 MED ORDER — VERAPAMIL HCL 2.5 MG/ML IV SOLN
INTRAVENOUS | Status: AC
  Filled 2019-05-21: qty 2

## 2019-05-21 MED ORDER — ASPIRIN 81 MG PO CHEW
81.0000 mg | CHEWABLE_TABLET | ORAL | Status: AC
  Administered 2019-05-21: 81 mg via ORAL

## 2019-05-21 MED ORDER — SODIUM CHLORIDE 0.9 % IV SOLN: 250.0000 mL | INTRAVENOUS | Status: DC | PRN

## 2019-05-21 MED ORDER — METOPROLOL TARTRATE 25 MG PO TABS: 25.0000 mg | ORAL_TABLET | Freq: Two times a day (BID) | ORAL | Status: DC

## 2019-05-21 MED ORDER — FENTANYL CITRATE (PF) 100 MCG/2ML IJ SOLN
50.0000 ug | Freq: Once | INTRAMUSCULAR | Status: DC
  Filled 2019-05-21: qty 2

## 2019-05-21 MED ORDER — NITROGLYCERIN 2 % TD OINT
1.0000 [in_us] | TOPICAL_OINTMENT | Freq: Once | TRANSDERMAL | Status: AC
  Administered 2019-05-21: 1 [in_us] via TOPICAL
  Filled 2019-05-21 (×2): qty 1

## 2019-05-21 MED ORDER — ACETAMINOPHEN 325 MG PO TABS: 650.0000 mg | ORAL_TABLET | ORAL | Status: DC | PRN

## 2019-05-21 MED ORDER — LOSARTAN POTASSIUM 25 MG PO TABS
25.0000 mg | ORAL_TABLET | Freq: Every day | ORAL | Status: DC
  Administered 2019-05-21 – 2019-05-22 (×2): 25 mg via ORAL
  Filled 2019-05-21 (×2): qty 1

## 2019-05-21 MED ORDER — VERAPAMIL HCL 2.5 MG/ML IV SOLN
INTRAVENOUS | Status: DC | PRN
  Administered 2019-05-21: 2.5 mg via INTRA_ARTERIAL

## 2019-05-21 MED ORDER — POTASSIUM CHLORIDE 20 MEQ PO PACK
40.0000 meq | PACK | Freq: Once | ORAL | Status: AC
  Administered 2019-05-21: 40 meq via ORAL
  Filled 2019-05-21: qty 2

## 2019-05-21 MED ORDER — CYANOCOBALAMIN 1000 MCG/ML IJ SOLN: 1000.0000 ug | INTRAMUSCULAR | Status: DC

## 2019-05-21 MED ORDER — CARVEDILOL 6.25 MG PO TABS
6.2500 mg | ORAL_TABLET | Freq: Two times a day (BID) | ORAL | Status: DC
  Administered 2019-05-21 – 2019-05-22 (×2): 6.25 mg via ORAL
  Filled 2019-05-21 (×2): qty 1

## 2019-05-21 MED ORDER — SODIUM CHLORIDE 0.9% FLUSH: 3.0000 mL | INTRAVENOUS | Status: DC | PRN

## 2019-05-21 MED ORDER — NITROGLYCERIN 0.4 MG SL SUBL: 0.4000 mg | SUBLINGUAL_TABLET | SUBLINGUAL | Status: DC | PRN

## 2019-05-21 MED ORDER — LIDOCAINE VISCOUS HCL 2 % MT SOLN
15.0000 mL | Freq: Once | OROMUCOSAL | Status: DC
  Filled 2019-05-21: qty 15

## 2019-05-21 MED ORDER — LABETALOL HCL 5 MG/ML IV SOLN: 20.0000 mg | INTRAVENOUS | Status: DC | PRN

## 2019-05-21 MED ORDER — ONDANSETRON HCL 4 MG/2ML IJ SOLN: 4.0000 mg | Freq: Four times a day (QID) | INTRAMUSCULAR | Status: DC | PRN

## 2019-05-21 MED ORDER — MIDAZOLAM HCL 2 MG/2ML IJ SOLN
INTRAMUSCULAR | Status: DC | PRN
  Administered 2019-05-21: 1 mg via INTRAVENOUS

## 2019-05-21 MED ORDER — SIMVASTATIN 20 MG PO TABS
20.0000 mg | ORAL_TABLET | Freq: Every day | ORAL | Status: DC
  Filled 2019-05-21: qty 1

## 2019-05-21 MED ORDER — ASPIRIN EC 81 MG PO TBEC
81.0000 mg | DELAYED_RELEASE_TABLET | Freq: Every day | ORAL | Status: DC
  Administered 2019-05-22: 81 mg via ORAL
  Filled 2019-05-21: qty 1

## 2019-05-21 MED ORDER — ISOSORBIDE MONONITRATE ER 30 MG PO TB24
15.0000 mg | ORAL_TABLET | Freq: Every day | ORAL | Status: DC
  Administered 2019-05-21 – 2019-05-22 (×3): 15 mg via ORAL
  Filled 2019-05-21 (×3): qty 1

## 2019-05-21 MED ORDER — CLOPIDOGREL BISULFATE 75 MG PO TABS
75.0000 mg | ORAL_TABLET | Freq: Every day | ORAL | Status: DC
  Administered 2019-05-21 – 2019-05-22 (×2): 75 mg via ORAL
  Filled 2019-05-21 (×2): qty 1

## 2019-05-21 MED ORDER — HEPARIN (PORCINE) 25000 UT/250ML-% IV SOLN
800.0000 [IU]/h | INTRAVENOUS | Status: DC
  Administered 2019-05-21: 800 [IU]/h via INTRAVENOUS
  Filled 2019-05-21: qty 250

## 2019-05-21 MED ORDER — PANTOPRAZOLE SODIUM 40 MG PO TBEC
40.0000 mg | DELAYED_RELEASE_TABLET | Freq: Every day | ORAL | Status: DC
  Administered 2019-05-21 – 2019-05-22 (×2): 40 mg via ORAL
  Filled 2019-05-21 (×2): qty 1

## 2019-05-21 MED ORDER — ASPIRIN 81 MG PO CHEW
CHEWABLE_TABLET | ORAL | Status: AC
  Filled 2019-05-21: qty 1

## 2019-05-21 MED ORDER — HEPARIN SODIUM (PORCINE) 1000 UNIT/ML IJ SOLN
INTRAMUSCULAR | Status: DC | PRN
  Administered 2019-05-21: 3000 [IU] via INTRAVENOUS

## 2019-05-21 MED ORDER — ALUM & MAG HYDROXIDE-SIMETH 200-200-20 MG/5ML PO SUSP
30.0000 mL | Freq: Once | ORAL | Status: AC
  Administered 2019-05-21: 30 mL via ORAL
  Filled 2019-05-21: qty 30

## 2019-05-21 MED ORDER — SODIUM CHLORIDE 0.9% FLUSH
3.0000 mL | Freq: Two times a day (BID) | INTRAVENOUS | Status: DC
  Administered 2019-05-21 – 2019-05-22 (×3): 3 mL via INTRAVENOUS

## 2019-05-21 MED ORDER — HEPARIN BOLUS VIA INFUSION
3000.0000 [IU] | Freq: Once | INTRAVENOUS | Status: AC
  Administered 2019-05-21: 3000 [IU] via INTRAVENOUS
  Filled 2019-05-21: qty 3000

## 2019-05-21 MED ORDER — IOHEXOL 300 MG/ML  SOLN
INTRAMUSCULAR | Status: DC | PRN
  Administered 2019-05-21: 55 mL

## 2019-05-21 MED ORDER — MIDAZOLAM HCL 2 MG/2ML IJ SOLN
INTRAMUSCULAR | Status: AC
  Filled 2019-05-21: qty 2

## 2019-05-21 MED ORDER — HEPARIN (PORCINE) IN NACL 1000-0.9 UT/500ML-% IV SOLN
INTRAVENOUS | Status: DC | PRN
  Administered 2019-05-21: 500 mL

## 2019-05-21 SURGICAL SUPPLY — 8 items
CATH INFINITI 5 FR JL3.5 (CATHETERS) ×1 IMPLANT
CATH INFINITI JR4 5F (CATHETERS) ×1 IMPLANT
DEVICE RAD TR BAND REGULAR (VASCULAR PRODUCTS) ×1 IMPLANT
GLIDESHEATH SLEND SS 6F .021 (SHEATH) ×1 IMPLANT
KIT MANI 3VAL PERCEP (MISCELLANEOUS) ×2 IMPLANT
PACK CARDIAC CATH (CUSTOM PROCEDURE TRAY) ×2 IMPLANT
WIRE HITORQ VERSACORE ST 145CM (WIRE) ×1 IMPLANT
WIRE ROSEN-J .035X260CM (WIRE) ×1 IMPLANT

## 2019-05-21 NOTE — ED Notes (Signed)
Pt transported to CT ?

## 2019-05-21 NOTE — ED Triage Notes (Addendum)
Pt arrived via EMS from home where pt reported central chest pain that radiates into throat, starting at approx. 2200 last night. Pt denies SOB, N/V. Pt took 3 sublingual nitro at home without relief.

## 2019-05-21 NOTE — H&P (View-Only) (Signed)
Cardiology Consultation:   Patient ID: Escarleth Gowell MRN: [id number]; DOB: 08-08-1931  Admit date: 05/21/2019 Date of Consult: 05/21/2019  Primary Care Provider: Abner Greenspan, MD Primary Cardiologist: Ida Rogue, MD  Primary Electrophysiologist:  None    Patient Profile:   Meisha Guster is a 86 y.o. female with a hx of coronary artery disease who is being seen today for the evaluation of non-STEMI at the request of Dr. Sidney Ace.  History of Present Illness:   Ms. Brackley is an 86 year old female with known history of coronary artery disease.  She had non ST elevation myocardial infarction in December 2018.  She had cardiac catheterization done at that time which was personally reviewed by me today.  It showed severe subtotal occlusion of the mid right coronary artery which was heavily calcified.  Attempted PCI was not successful due to inability to cross with a balloon.  Thus, the patient was treated medically with plans to consider atherectomy if she continued to have anginal symptoms.  However, she stabilized with medical therapy and has done well since then.  She has multiple chronic medical conditions that include chronic anemia, essential hypertension and hyperlipidemia.  She had previous syncope felt to be due to orthostatic hypotension.  She was most recently seen by Dr. Candis Musa in September and was noted to have stable anginal symptoms responding to rest and occasionally sublingual nitroglycerin. Last night, she started having substernal chest tightness similar to her previous myocardial infarction around 10 PM.  She took 3 sublingual nitroglycerin and the pain was only partially relieved.  It was described as tightness and heavy feeling.  Her symptoms continued and thus she came to the ED for evaluation.  Her EKG showed sinus rhythm with LVH and QRS widening resampling left bundle branch block.  These EKG changes are not new.  Initial troponin was normal.  However, it gradually  increased since then and the most recent one was slightly above 1000.  She is currently chest pain-free.  CTA of the chest showed no evidence of aortic dissection.  She was hypertensive on presentation with blood pressure of 189/85.   Past Medical History:  Diagnosis Date  . Allergic rhinitis   . Anemia   . Anxiety   . GERD (gastroesophageal reflux disease)   . History of radiation therapy 04/29/16- 05/24/16   Left Breast 40.05 Gy in 15 fractions, Left Breast boost 10 Gy in 5 fractions.   . Hyperlipidemia   . Hypertension    treated by Dr Thurnell Garbe recently with syncope episode  . Osteopenia   . Osteoporosis   . Personal history of radiation therapy 2018    Past Surgical History:  Procedure Laterality Date  . APPENDECTOMY    . BREAST BIOPSY Left 11/29/2008  . BREAST BIOPSY Left 01/26/2016   malignant  . BREAST EXCISIONAL BIOPSY Left 12/26/2008  . BREAST LUMPECTOMY Left 02/27/2016  . BREAST LUMPECTOMY Left 04/01/2016  . BREAST LUMPECTOMY WITH RADIOACTIVE SEED LOCALIZATION Left 02/27/2016   Procedure: LEFT BREAST LUMPECTOMY WITH RADIOACTIVE SEED LOCALIZATION;  Surgeon: Autumn Messing III, MD;  Location: [facility];  Service: General;  Laterality: Left;  . BREAST SURGERY  10/10   benign biopsy  negative  . CESAREAN SECTION    . CHOLECYSTECTOMY    . CORONARY STENT INTERVENTION N/A 02/11/2017   Procedure: CORONARY STENT INTERVENTION;  Surgeon: Nelva Bush, MD;  Location: [facility];  Service: Cardiovascular;  Laterality: N/A;  . FEMUR FRACTURE SURGERY Right 2006  .  LEFT HEART CATH AND CORONARY ANGIOGRAPHY N/A 02/11/2017   Procedure: LEFT HEART CATH AND CORONARY ANGIOGRAPHY;  Surgeon: Nelva Bush, MD;  Location: [facility];  Service: Cardiovascular;  Laterality: N/A;  . OVARIAN CYST SURGERY    . RE-EXCISION OF BREAST LUMPECTOMY Left 04/01/2016   Procedure: RE-EXCISION OF LEFT BREAST MEDIAL MARGIN;  Surgeon: Autumn Messing III, MD;  Location: [facility];  Service: General;  Laterality: Left;     Home Medications:  Prior to Admission medications   Medication Sig Start Date End Date Taking? Authorizing Provider  aspirin (ASPIRIN EC) 81 MG EC tablet Take 81 mg by mouth daily. Swallow whole.    Yes [provider]  Cholecalciferol (VITAMIN D) 2000 UNITS CAPS Take 1 capsule by mouth daily.     Yes [provider]  clopidogrel (PLAVIX) 75 MG tablet Take 1 tablet (75 mg total) by mouth daily with breakfast. 05/12/19  Yes Tower, [name], MD  cyanocobalamin (,VITAMIN B-12,) 1000 MCG/ML injection Inject 1,000 mcg into the muscle every 3 (three) months.   Yes [provider]  isosorbide mononitrate (IMDUR) 30 MG 24 hr tablet TAKE ONE-HALF TABLET BY MOUTH EVERY DAY 02/15/19  Yes Gollan, Kathlene November, MD  nitroGLYCERIN (NITROSTAT) 0.4 MG SL tablet PLACE 1 TABLET UNDER TONGUE EVERY 5 MIN AS NEEDED FOR CHEST PAIN IF NO RELIEF IN15 MIN CALL 911 (MAX 3 TABS) 12/29/18  Yes Gollan, Kathlene November, MD  omeprazole (PRILOSEC) 20 MG capsule Take 1 capsule (20 mg total) by mouth daily as needed. 04/21/19  Yes Tower, [name], MD    Inpatient Medications: Scheduled Meds: . [START ON 05/22/2019] aspirin  81 mg Oral Pre-Cath  . aspirin EC  81 mg Oral Daily  . cholecalciferol  1,000 Units Oral Daily  . clopidogrel  75 mg Oral Q breakfast  . [START ON 05/24/2019] cyanocobalamin  1,000 mcg Intramuscular Q90 days  . isosorbide mononitrate  15 mg Oral Daily  . metoprolol tartrate  25 mg Oral BID  . pantoprazole  40 mg Oral Daily  . simvastatin  20 mg Oral q1800  . sodium chloride flush  3 mL Intravenous Q12H   Continuous Infusions: . sodium chloride 75 mL/hr at 05/21/19 0832  . sodium chloride    . sodium chloride    . heparin 800 Units/hr (05/21/19 0832)   PRN Meds: sodium chloride, acetaminophen, labetalol, morphine injection, nitroGLYCERIN, ondansetron (ZOFRAN) IV, sodium chloride flush  Allergies:    Allergies  Allergen  Reactions  . Atorvastatin     Made her confused and dizzy  . Buspirone Hcl     REACTION: HEART PALPITATIONS  . Codeine     REACTION: nausea and vomiting  . Hydrochlorothiazide     REACTION: syncope-possibly from dehydration  . Iron     Oral iron -per pt made her "pass out"   . Lansoprazole     REACTION: abd pain  . Minocycline Hcl   . Penicillins     REACTION: mouth numbness Has patient had a PCN reaction causing immediate rash, facial/tongue/throat swelling, SOB or lightheadedness with hypotension: No Has patient had a PCN reaction causing severe rash involving mucus membranes or skin necrosis: No Has patient had a PCN reaction that required hospitalization: No Has patient had a PCN reaction occurring within the last 10 years: No If all of the above answers are "NO", then may proceed with Cephalosporin use.  Marland Kitchen Phenergan [Promethazine Hcl] Other (See Comments)    Body shakes,  hallucinations  . Ramipril     Cough   . Ranexa [Ranolazine]     Constipation   . Sulfa Antibiotics Nausea Only    Social History:   Social History   Socioeconomic History  . Marital status: Widowed    Spouse name: Not on file  . Number of children: 1  . Years of education: Not on file  . Highest education level: Not on file  Occupational History    Employer: RETIRED  Tobacco Use  . Smoking status: Former Smoker    Quit date: 03/11/1968    Years since quitting: 51.2  . Smokeless tobacco: Never Used  Substance and Sexual Activity  . Alcohol use: No    Alcohol/week: 0.0 standard drinks  . Drug use: No  . Sexual activity: Not Currently  Other Topics Concern  . Not on file  Social History Narrative  . Not on file   Social Determinants of Health   Financial Resource Strain: Low Risk   . Difficulty of Paying Living Expenses: Not hard at all  Food Insecurity: No Food Insecurity  . Worried About Charity fundraiser in the Last Year: Never true  . Ran Out of Food in the Last Year: Never true    Transportation Needs: No Transportation Needs  . Lack of Transportation (Medical): No  . Lack of Transportation (Non-Medical): No  Physical Activity: Inactive  . Days of Exercise per Week: 0 days  . Minutes of Exercise per Session: 0 min  Stress: No Stress Concern Present  . Feeling of Stress : Not at all  Social Connections:   . Frequency of Communication with Friends and Family:   . Frequency of Social Gatherings with Friends and Family:   . Attends Religious Services:   . Active Member of Clubs or Organizations:   . Attends Archivist Meetings:   Marland Kitchen Marital Status:   Intimate Partner Violence: Not At Risk  . Fear of Current or Ex-Partner: No  . Emotionally Abused: No  . Physically Abused: No  . Sexually Abused: No    Family History:    Family History  Problem Relation Age of Onset  . Hypertension Mother   . Heart disease Father   . Macular degeneration Sister   . Diabetes Maternal Aunt   . Cancer Maternal Aunt        throat     ROS:  Please see the history of present illness.   All other ROS reviewed and negative.     Physical Exam/Data:   Vitals:   05/21/19 0515 05/21/19 0557 05/21/19 0643 05/21/19 0751  BP: (!) 147/73 (!) 155/66  (!) 144/69  Pulse: 87 90  84  Resp: 16 18  16   Temp:  98.2 F (36.8 C)  98.1 F (36.7 C)  TempSrc:  Oral    SpO2: 97% 100%  98%  Height:   5' (1.524 m)     Intake/Output Summary (Last 24 hours) at 05/21/2019 0907 Last data filed at 05/21/2019 [id number] Gross per 24 hour  Intake 313.67 ml  Output 400 ml  Net -86.33 ml   Last 3 Weights 05/12/2019 04/21/2019 04/14/2019  Weight (lbs) 133 lb 3.2 oz 132 lb 4 oz (No Data)  Weight (kg) 60.419 kg 59.988 kg (No Data)     Body mass index is 26.01 kg/m.  General:  Well nourished, well developed, in no acute distress HEENT: normal Lymph: no adenopathy Neck: no JVD Endocrine:  No thryomegaly Vascular: No carotid bruits;  FA pulses 2+ bilaterally without bruits  Cardiac:  normal  S1, S2; RRR; 2 out of 6 systolic murmur at the base which is early peaking. Lungs:  clear to auscultation bilaterally, no wheezing, rhonchi or rales  Abd: soft, nontender, no hepatomegaly  Ext: no edema Musculoskeletal:  No deformities, BUE and BLE strength normal and equal Skin: warm and dry  Neuro:  CNs 2-12 intact, no focal abnormalities noted Psych:  Normal affect  Right radial pulses normal.  EKG:  The EKG was personally reviewed and demonstrates: Normal sinus rhythm with LVH and QRS widening. Telemetry:  Telemetry was personally reviewed and demonstrates: Normal sinus rhythm  Relevant CV Studies:   Laboratory Data:  High Sensitivity Troponin:   Recent Labs  Lab 05/21/19 0148 05/21/19 0315 05/21/19 0703  TROPONINIHS 13 71* 1,084*     Chemistry Recent Labs  Lab 05/21/19 0148 05/21/19 0703  NA 134* 136  K 3.5 4.7  CL 102 105  CO2 22 23  GLUCOSE 115* 111*  BUN 7* 6*  CREATININE 0.87 0.81  CALCIUM 9.3 8.9  GFRNONAA >60 >60  GFRAA >60 >60  ANIONGAP 10 8    Recent Labs  Lab 05/21/19 0148  PROT 8.8*  ALBUMIN 4.2  AST 26  ALT 15  ALKPHOS 73  BILITOT 0.8   Hematology Recent Labs  Lab 05/21/19 0148  WBC 5.9  RBC 4.27  HGB 9.6*  HCT 31.9*  MCV 74.7*  MCH 22.5*  MCHC 30.1  RDW 17.8*  PLT 224   BNPNo results for input(s): BNP, PROBNP in the last 168 hours.  DDimer No results for input(s): DDIMER in the last 168 hours.   Radiology/Studies:  DG Chest Portable 1 View  Result Date: 05/21/2019 CLINICAL DATA:  86 year old female with chest pain since 20/2 100 hours. EXAM: PORTABLE CHEST 1 VIEW COMPARISON:  Portable chest 02/10/2017 and earlier. FINDINGS: Portable AP upright view at 0208 hours. Pacer or resuscitation pads project over the chest. Larger lung volumes. Stable mild cardiomegaly. Other mediastinal contours are within normal limits. Visualized tracheal air column is within normal limits. Chronic pulmonary interstitial thickening, although mildly  increased. No pneumothorax, pleural effusion or confluent pulmonary opacity. Stable left breast or chest wall surgical clips. Paucity of bowel gas. No acute osseous abnormality identified. IMPRESSION: 1. Chronic but increased since 2018 pulmonary interstitial opacity. Consider mild or developing interstitial edema, viral/atypical respiratory infection. Alternatively this might be progressed chronic lung disease. 2. Stable cardiomegaly.  No pleural fluid identified. Electronically Signed   By: Genevie Ann M.D.   On: 05/21/2019 02:45   CT ANGIO CHEST AORTA W/CM & OR WO/CM  Result Date: 05/21/2019 CLINICAL DATA:  Concern for aortic dissection. Chest pain. EXAM: CT ANGIOGRAPHY CHEST WITH CONTRAST TECHNIQUE: Multidetector CT imaging of the chest was performed using the standard protocol during bolus administration of intravenous contrast. Multiplanar CT image reconstructions and MIPs were obtained to evaluate the vascular anatomy. CONTRAST:  12mL OMNIPAQUE IOHEXOL 350 MG/ML SOLN COMPARISON:  CT and pelvis dated 05/06/2015 FINDINGS: Cardiovascular: There is no evidence for a thoracic aortic dissection or aneurysm. There are atherosclerotic changes throughout the thoracic aorta. There is no large centrally located pulmonary embolism. Mediastinum/Nodes: --No mediastinal or hilar lymphadenopathy. --No axillary lymphadenopathy. --No supraclavicular lymphadenopathy. --Normal thyroid gland. --The esophagus is unremarkable Lungs/Pleura: There is subpleural reticular changes bilaterally. There is no focal infiltrate or pneumothorax. The trachea is unremarkable. There is no pleural effusion. 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 No further follow-up is required. Musculoskeletal: There are stable appearing compression fractures of the T12 and L1 vertebral bodies. Review of the MIP images confirms the above findings. IMPRESSION: 1. No CT evidence for thoracic aortic dissection  or aneurysm. 2. No acute intrathoracic disease. 3. Stable appearing compression fractures of the T12 and L1 vertebral bodies. Aortic Atherosclerosis (ICD10-I70.0). Electronically Signed   By: Constance Holster M.D.   On: 05/21/2019 03:41       TIMI Risk Score for Unstable Angina or Non-ST Elevation MI:   The patient's TIMI risk score is 6, which indicates a 41% risk of all cause mortality, new or recurrent myocardial infarction or need for urgent revascularization in the next 14 days.   Assessment and Plan:   1. Non-ST elevation myocardial infarction: Known history of coronary artery disease with unsuccessful RCA PCI.  Given the patient's current presentation with myocardial infarction, recommend proceeding with left heart catheterization and possible PCI.  I discussed the procedure in details as well as risks and benefits.  In the meanwhile, continue dual antiplatelet therapy with aspirin and clopidogrel.  Continue heparin drip.  Further recommendations to follow after cardiac cath. 2. Essential hypertension with significantly elevated blood pressure on presentation: Blood pressure improved significantly since then.  Could have been related to her chest pain.  I would consider switching metoprolol to carvedilol or adding amlodipine for its antianginal effect. 3. Hyperlipidemia: Currently on simvastatin 20 mg daily.  Recent lipid profile showed an LDL of 121 and we should consider a more potent statin.      For questions or updates, please contact [facility] Please consult [email or web address] for contact info under     Signed, Kathlyn Sacramento, MD  05/21/2019 9:07 AM

## 2019-05-21 NOTE — Progress Notes (Signed)
ANTICOAGULATION CONSULT NOTE - Initial Consult  Pharmacy Consult for heparin Indication: chest pain/ACS  Allergies  Allergen Reactions  . Atorvastatin     Made her confused and dizzy  . Buspirone Hcl     REACTION: HEART PALPITATIONS  . Codeine     REACTION: nausea and vomiting  . Hydrochlorothiazide     REACTION: syncope-possibly from dehydration  . Iron     Oral iron -per pt made her "pass out"   . Lansoprazole     REACTION: abd pain  . Minocycline Hcl   . Penicillins     REACTION: mouth numbness Has patient had a PCN reaction causing immediate rash, facial/tongue/throat swelling, SOB or lightheadedness with hypotension: No Has patient had a PCN reaction causing severe rash involving mucus membranes or skin necrosis: No Has patient had a PCN reaction that required hospitalization: No Has patient had a PCN reaction occurring within the last 10 years: No If all of the above answers are "NO", then may proceed with Cephalosporin use.  Marland Kitchen Phenergan [Promethazine Hcl] Other (See Comments)    Body shakes, hallucinations  . Ramipril     Cough   . Ranexa [Ranolazine]     Constipation   . Sulfa Antibiotics Nausea Only    Patient Measurements:   Heparin Dosing Weight: 53 kg  Vital Signs: Temp: 98.2 F (36.8 C) (03/12 0146) Temp Source: Oral (03/12 0146) BP: 189/85 (03/12 0147) Pulse Rate: 110 (03/12 0147)  Labs: Recent Labs    05/21/19 0148  HGB 9.6*  HCT 31.9*  PLT 224    CrCl cannot be calculated (Patient's most recent lab result is older than the maximum 21 days allowed.).   Medical History: Past Medical History:  Diagnosis Date  . Allergic rhinitis   . Anemia   . Anxiety   . GERD (gastroesophageal reflux disease)   . History of radiation therapy 04/29/16- 05/24/16   Left Breast 40.05 Gy in 15 fractions, Left Breast boost 10 Gy in 5 fractions.   . Hyperlipidemia   . Hypertension    treated by Dr Thurnell Garbe recently with syncope episode  . Osteopenia    . Osteoporosis   . Personal history of radiation therapy 2018    Medications:  Scheduled:  . fentaNYL (SUBLIMAZE) injection  50 mcg Intravenous Once  . heparin  3,000 Units Intravenous Once    Assessment: Patient admitted x CP radiating in throat w/ h/o CAD w/ angina pectoris, HTN, HLD, breast CA and anemia on feraheme infusions. Patient was called as CODE STEMI but then cancelled. Baseline CBC WNL x patient's baseline. Trops trending, EKG pending baseline coags also pending. No anticoagulation PTA. Patient is being started on a heparin drip for management of NSTEMI.  Goal of Therapy:  Heparin level 0.3-0.7 units/ml Monitor platelets by anticoagulation protocol: Yes   Plan:  Will bolus heparin 3000 units IV x 1 Will start rate at 800 units/hr  Will check anti-Xa at 1000 Will monitor daily CBC's and adjust per anti-Xa levels.  Tobie Lords, PharmD, BCPS Clinical Pharmacist 05/21/2019,2:20 AM

## 2019-05-21 NOTE — Progress Notes (Signed)
CRITICAL VALUE ALERT  Critical Value:  Troponin 1,084  Date & Time Notied:  05/21/19 0830  Provider Notified: MD Reesa Chew   Orders Received/Actions taken: Verbal ok, MD will speak with Cardiology

## 2019-05-21 NOTE — ED Notes (Signed)
This RN will start heparin administration after CT scan per MD Quentin Cornwall order.

## 2019-05-21 NOTE — Interval H&P Note (Signed)
Cath Lab Visit (complete for each Cath Lab visit)  Clinical Evaluation Leading to the Procedure:   ACS: Yes.    Non-ACS:  n/a    History and Physical Interval Note:  05/21/2019 10:06 AM  Jenna Young  has presented today for surgery, with the diagnosis of Non ST Elevation Myocardial Infarction.  The various methods of treatment have been discussed with the patient and family. After consideration of risks, benefits and other options for treatment, the patient has consented to  Procedure(s): LEFT HEART CATH AND CORONARY ANGIOGRAPHY (N/A) as a surgical intervention.  The patient's history has been reviewed, patient examined, no change in status, stable for surgery.  I have reviewed the patient's chart and labs.  Questions were answered to the patient's satisfaction.     Kathlyn Sacramento

## 2019-05-21 NOTE — ED Notes (Signed)
This RN is waiting for nitropaste papers to be sent from pharmacy and will place nitropaste once available

## 2019-05-21 NOTE — H&P (Addendum)
[facility] NAME: Jenna Young    MR#:  [id number]  DATE OF BIRTH:  Aug 11, 1931  DATE OF ADMISSION:  05/21/2019  PRIMARY CARE PHYSICIAN: Tower, [name], MD   REQUESTING/REFERRING PHYSICIAN: Merlyn Lot, MD  CHIEF COMPLAINT:   Chief Complaint  Patient presents with  . Chest Pain    HISTORY OF PRESENT ILLNESS:  Jenna Young  is a 86 y.o. Caucasian female with a known history of hypertension comes lipidemia, osteoporosis, anemia, anxiety, GERD and anemia, presented to the emergency room with acute onset of chest pain graded 10/10 in severity and felt as pressure  with no nausea vomiting or diaphoresis or dyspnea or palpitations.  She denied any cough or wheezing or hemoptysis.  No leg pain or edema recent travels or surgeries.  She denied any fever or chills.  No headache or dizziness or blurred vision.  No bleeding diathesis.  Upon presentation to the emergency room, EKG showed normal sinus rhythm with a rate of 70 with probable left atrial enlargement and left bundle branch block that was previously seen on EKG in August 2019.  Initial blood pressure was 189/85 with a heart rate of 110 respiratory to 24 at approximately was high percent on room air.  Labs revealed a potassium of 3.5 with a sodium of 134 high-sensitivity troponin I at 13 and later 71.  CBC with anemia close to baseline with hemoglobin of 9.6 and hematocrit 31.9.  Her COVID-19 PCR and influenza antigens came back negative.  Portable chest x-ray showed stable cardiomegaly with chronic but increased pulmonary interstitial opacity since 2018 could be related to mild developing interstitial edema or atypical viral infection.  This could also be progressed chronic lung disease.  No acute cardiopulmonary disease.  Chest CTA revealed no evidence for aortic dissection or aneurysm or acute intrathoracic disease.  It showed stable appearing compression fractures of T12 and L1 vertebral bodies.  It  also showed aortic atherosclerosis.  The patient was given an inch of Nitropaste, as needed sublingual nitroglycerin and 50 mcg of IV fentanyl as well as IV heparin bolus and drip.  Contact was made with Dr. Clayborn Bigness was on-call for STEMI.  Repeat EKG however showed no evidence for STEMI but old left bundle blanch block and troponin I was negative as mentioned above.  The patient will be admitted to an observation telemetry bed for further evaluation and management.  PAST MEDICAL HISTORY:   Past Medical History:  Diagnosis Date  . Allergic rhinitis   . Anemia   . Anxiety   . GERD (gastroesophageal reflux disease)   . History of radiation therapy 04/29/16- 05/24/16   Left Breast 40.05 Gy in 15 fractions, Left Breast boost 10 Gy in 5 fractions.   . Hyperlipidemia   . Hypertension    treated by Dr Thurnell Garbe recently with syncope episode  . Osteopenia   . Osteoporosis   . Personal history of radiation therapy 2018  -Coronary artery disease with history of non-STEMI -Vitamin B12 deficiency -Benign positional vertigo  PAST SURGICAL HISTORY:   Past Surgical History:  Procedure Laterality Date  . APPENDECTOMY    . BREAST BIOPSY Left 11/29/2008  . BREAST BIOPSY Left 01/26/2016   malignant  . BREAST EXCISIONAL BIOPSY Left 12/26/2008  . BREAST LUMPECTOMY Left 02/27/2016  . BREAST LUMPECTOMY Left 04/01/2016  . BREAST LUMPECTOMY WITH RADIOACTIVE SEED LOCALIZATION Left 02/27/2016   Procedure: LEFT BREAST LUMPECTOMY WITH RADIOACTIVE SEED LOCALIZATION;  Surgeon: Autumn Messing III,  MD;  Location: [facility];  Service: General;  Laterality: Left;  . BREAST SURGERY  10/10   benign biopsy  negative  . CESAREAN SECTION    . CHOLECYSTECTOMY    . CORONARY STENT INTERVENTION N/A 02/11/2017   Procedure: CORONARY STENT INTERVENTION;  Surgeon: Nelva Bush, MD;  Location: [facility];  Service: Cardiovascular;  Laterality: N/A;  . FEMUR FRACTURE SURGERY Right 2006  . LEFT  HEART CATH AND CORONARY ANGIOGRAPHY N/A 02/11/2017   Procedure: LEFT HEART CATH AND CORONARY ANGIOGRAPHY;  Surgeon: Nelva Bush, MD;  Location: [facility];  Service: Cardiovascular;  Laterality: N/A;  . OVARIAN CYST SURGERY    . RE-EXCISION OF BREAST LUMPECTOMY Left 04/01/2016   Procedure: RE-EXCISION OF LEFT BREAST MEDIAL MARGIN;  Surgeon: Autumn Messing III, MD;  Location: [facility];  Service: General;  Laterality: Left;    SOCIAL HISTORY:   Social History   Tobacco Use  . Smoking status: Former Smoker    Quit date: 03/11/1968    Years since quitting: 51.2  . Smokeless tobacco: Never Used  Substance Use Topics  . Alcohol use: No    Alcohol/week: 0.0 standard drinks    FAMILY HISTORY:   Family History  Problem Relation Age of Onset  . Hypertension Mother   . Heart disease Father   . Macular degeneration Sister   . Diabetes Maternal Aunt   . Cancer Maternal Aunt        throat    DRUG ALLERGIES:   Allergies  Allergen Reactions  . Atorvastatin     Made her confused and dizzy  . Buspirone Hcl     REACTION: HEART PALPITATIONS  . Codeine     REACTION: nausea and vomiting  . Hydrochlorothiazide     REACTION: syncope-possibly from dehydration  . Iron     Oral iron -per pt made her "pass out"   . Lansoprazole     REACTION: abd pain  . Minocycline Hcl   . Penicillins     REACTION: mouth numbness Has patient had a PCN reaction causing immediate rash, facial/tongue/throat swelling, SOB or lightheadedness with hypotension: No Has patient had a PCN reaction causing severe rash involving mucus membranes or skin necrosis: No Has patient had a PCN reaction that required hospitalization: No Has patient had a PCN reaction occurring within the last 10 years: No If all of the above answers are "NO", then may proceed with Cephalosporin use.  Marland Kitchen Phenergan [Promethazine Hcl] Other (See Comments)    Body shakes, hallucinations  . Ramipril     Cough   .  Ranexa [Ranolazine]     Constipation   . Sulfa Antibiotics Nausea Only    REVIEW OF SYSTEMS:   ROS As per history of present illness. All pertinent systems were reviewed above. Constitutional,  HEENT, cardiovascular, respiratory, GI, GU, musculoskeletal, neuro, psychiatric, endocrine,  integumentary and hematologic systems were reviewed and are otherwise  negative/unremarkable except for positive findings mentioned above in the HPI.   MEDICATIONS AT HOME:   Prior to Admission medications   Medication Sig Start Date End Date Taking? Authorizing Provider  aspirin (ASPIRIN EC) 81 MG EC tablet Take 81 mg by mouth daily. Swallow whole.     [provider]  Cholecalciferol (VITAMIN D) 2000 UNITS CAPS Take 1 capsule by mouth daily.      [provider]  clopidogrel (PLAVIX) 75 MG tablet Take 1 tablet (75 mg total) by mouth daily with breakfast.  05/12/19   Tower, [name], MD  cyanocobalamin (,VITAMIN B-12,) 1000 MCG/ML injection Inject 1,000 mcg into the muscle every 3 (three) months.    [provider]  isosorbide mononitrate (IMDUR) 30 MG 24 hr tablet TAKE ONE-HALF TABLET BY MOUTH EVERY DAY 02/15/19   Gollan, Kathlene November, MD  nitroGLYCERIN (NITROSTAT) 0.4 MG SL tablet PLACE 1 TABLET UNDER TONGUE EVERY 5 MIN AS NEEDED FOR CHEST PAIN IF NO RELIEF IN15 MIN CALL 911 (MAX 3 TABS) 12/29/18   Gollan, Kathlene November, MD  omeprazole (PRILOSEC) 20 MG capsule Take 1 capsule (20 mg total) by mouth daily as needed. 04/21/19   Tower, [name], MD      VITAL SIGNS:  Blood pressure (!) 189/85, pulse (!) 110, temperature 98.2 F (36.8 C), temperature source Oral, resp. rate (!) 24, SpO2 100 %.  PHYSICAL EXAMINATION:  Physical Exam  GENERAL:  86 y.o.-year-old Caucasian female patient lying in the bed with no acute distress.  EYES: Pupils equal, round, reactive to light and accommodation. No scleral icterus. Extraocular muscles intact.  HEENT: Head atraumatic, normocephalic. Oropharynx and  nasopharynx clear.  NECK:  Supple, no jugular venous distention. No thyroid enlargement, no tenderness.  LUNGS: Normal breath sounds bilaterally, no wheezing, rales,rhonchi or crepitation. No use of accessory muscles of respiration.  CARDIOVASCULAR: Regular rate and rhythm, S1, S2 normal. No murmurs, rubs, or gallops.  ABDOMEN: Soft, nondistended, nontender. Bowel sounds present. No organomegaly or mass.  EXTREMITIES: No pedal edema, cyanosis, or clubbing.  NEUROLOGIC: Cranial nerves II through XII are intact. Muscle strength 5/5 in all extremities. Sensation intact. Gait not checked.  PSYCHIATRIC: The patient is alert and oriented x 3.  Normal affect and good eye contact. SKIN: No obvious rash, lesion, or ulcer.   LABORATORY PANEL:   CBC Recent Labs  Lab 05/21/19 0148  WBC 5.9  HGB 9.6*  HCT 31.9*  PLT 224   ------------------------------------------------------------------------------------------------------------------  Chemistries  Recent Labs  Lab 05/21/19 0148  NA 134*  K 3.5  CL 102  CO2 22  GLUCOSE 115*  BUN 7*  CREATININE 0.87  CALCIUM 9.3  AST 26  ALT 15  ALKPHOS 73  BILITOT 0.8   ------------------------------------------------------------------------------------------------------------------  Cardiac Enzymes No results for input(s): TROPONINI in the last 168 hours. ------------------------------------------------------------------------------------------------------------------  RADIOLOGY:  DG Chest Portable 1 View  Result Date: 05/21/2019 CLINICAL DATA:  86 year old female with chest pain since 20/2 100 hours. EXAM: PORTABLE CHEST 1 VIEW COMPARISON:  Portable chest 02/10/2017 and earlier. FINDINGS: Portable AP upright view at 0208 hours. Pacer or resuscitation pads project over the chest. Larger lung volumes. Stable mild cardiomegaly. Other mediastinal contours are within normal limits. Visualized tracheal air column is within normal limits. Chronic  pulmonary interstitial thickening, although mildly increased. No pneumothorax, pleural effusion or confluent pulmonary opacity. Stable left breast or chest wall surgical clips. Paucity of bowel gas. No acute osseous abnormality identified. IMPRESSION: 1. Chronic but increased since 2018 pulmonary interstitial opacity. Consider mild or developing interstitial edema, viral/atypical respiratory infection. Alternatively this might be progressed chronic lung disease. 2. Stable cardiomegaly.  No pleural fluid identified. Electronically Signed   By: Genevie Ann M.D.   On: 05/21/2019 02:45      IMPRESSION AND PLAN:   1.Chest pain, with rising troponin I rule out acute coronary syndrome.   -The patient will be admitted to an observation telemetry bed.   -Will follow serial cardiac enzymes and EKGs.   -We will obtain a cardiology consult in a.m. for further  cardiac risk stratification.  I notified Dr. Oval Linsey about the patient. -We will continue IV heparin for now. -We will check fasting lipids-therapy with Zocor. - The patient will be placed on her aspirin and Plavix as well as p.r.n. sublingual nitroglycerin and morphine sulfate for pain.  2.  Hypertensive urgency. -This could be the culprit for #1. -We will place the patient on as needed IV labetalol and place her on scheduled beta-blocker therapy.  3.  Coronary artery disease. -Management as above. -We will continue fasting lipids and place her on statin therapy. -We will resume her aspirin and Plavix and add beta-blocker therapy with Lopressor. - 4.  GERD. -PPI therapy will be resumed.  5.  Chronic anemia. -The patient is on iron transfusion on Wednesdays.  6.  DVT prophylaxis. -Patient will be on IV heparin.   All the records are reviewed and case discussed with ED provider. The plan of care was discussed in details with the patient (and family). I answered all questions. The patient agreed to proceed with the above mentioned plan.  Further management will depend upon hospital course.   CODE STATUS: Full code  TOTAL TIME TAKING CARE OF THIS PATIENT: 50 minutes.    Christel Mormon M.D on 05/21/2019 at 3:38 AM  [facility]   From 7 PM-7 AM, contact night-coverage [email or web address]  CC: Primary care physician; Tower, [name], MD   Note: This dictation was prepared with Dragon dictation along with smaller phrase technology. Any transcriptional errors that result from this process are unintentional.

## 2019-05-21 NOTE — Progress Notes (Signed)
MD had been previously notified of patient taking atorvastatin instead of simvastatin. Simvastatin ordered here so MD asked to change that to atorvastatin. Atorvastatin not listed on her home med list.This RN has not seen any new changes, reported off to oncoming nurse to pass off to day shift so it can be addressed.

## 2019-05-21 NOTE — Progress Notes (Signed)
No charge progress note.  Jenna Young  is a 86 y.o. Caucasian female with a known history of hypertension comes lipidemia, osteoporosis, anemia, anxiety, GERD and anemia, presented to the emergency room with acute onset of chest pain graded 10/10 in severity and felt as pressure  with no nausea vomiting or diaphoresis or dyspnea or palpitations. Troponin markedly elevated on trending, cardiology was consulted and she was taken for cardiac catheterization which shows multivessel disease.  No intervention was done and recommending maximum medical treatment. She also had new diagnosis of reduced EF. Echocardiogram was ordered for tomorrow by cardiology. Continue current management.

## 2019-05-21 NOTE — ED Provider Notes (Signed)
[facility] Emergency Department Provider Note    First MD Initiated Contact with Patient 05/21/19 0147     (approximate)  I have reviewed the triage vital signs and the nursing notes.   HISTORY  Chief Complaint Chest Pain    HPI Jenna Young is a 86 y.o. female with extensive past medical history as listed below presents to the ER via EMS after started to have chest midsternal chest discomfort nonradiating around 10:00 this evening.  States it did go up into her throat.  Did not become diaphoretic with this.  No nausea.   She did take nitro without much relief.  EMS found patient did have some findings concerning for STEMI which was paged out in route.  She arrives to the ER currently pain-free.  Denies any discomfort.  No shortness of breath.  Nuys any abdominal pain.  LHC: 1. Significant mid RCA disease with sequential, calcified 80 and 95% stenoses. Element of thrombus is likely also present. 2. 90% ostial stenosis involving small OM1 branch. 3. Mildly reduced left ventricular contraction with inferior hypokinesis (LVEF ~45%). 4. Mildly elevated left ventricular filling pressure. 5. Unsuccessful PCI to mid RCA due to inability to cross the mid RCA stenosis despite aggressive buddy wire support.   Past Medical History:  Diagnosis Date  . Allergic rhinitis   . Anemia   . Anxiety   . GERD (gastroesophageal reflux disease)   . History of radiation therapy 04/29/16- 05/24/16   Left Breast 40.05 Gy in 15 fractions, Left Breast boost 10 Gy in 5 fractions.   . Hyperlipidemia   . Hypertension    treated by Dr Thurnell Garbe recently with syncope episode  . Osteopenia   . Osteoporosis   . Personal history of radiation therapy 2018   Family History  Problem Relation Age of Onset  . Hypertension Mother   . Heart disease Father   . Macular degeneration Sister   . Diabetes Maternal Aunt   . Cancer Maternal Aunt        throat   Past Surgical  History:  Procedure Laterality Date  . APPENDECTOMY    . BREAST BIOPSY Left 11/29/2008  . BREAST BIOPSY Left 01/26/2016   malignant  . BREAST EXCISIONAL BIOPSY Left 12/26/2008  . BREAST LUMPECTOMY Left 02/27/2016  . BREAST LUMPECTOMY Left 04/01/2016  . BREAST LUMPECTOMY WITH RADIOACTIVE SEED LOCALIZATION Left 02/27/2016   Procedure: LEFT BREAST LUMPECTOMY WITH RADIOACTIVE SEED LOCALIZATION;  Surgeon: Autumn Messing III, MD;  Location: [place];  Service: General;  Laterality: Left;  . BREAST SURGERY  10/10   benign biopsy  negative  . CESAREAN SECTION    . CHOLECYSTECTOMY    . CORONARY STENT INTERVENTION N/A 02/11/2017   Procedure: CORONARY STENT INTERVENTION;  Surgeon: Nelva Bush, MD;  Location: [place] [facility];  Service: Cardiovascular;  Laterality: N/A;  . FEMUR FRACTURE SURGERY Right 2006  . LEFT HEART CATH AND CORONARY ANGIOGRAPHY N/A 02/11/2017   Procedure: LEFT HEART CATH AND CORONARY ANGIOGRAPHY;  Surgeon: Nelva Bush, MD;  Location: [place] [facility];  Service: Cardiovascular;  Laterality: N/A;  . OVARIAN CYST SURGERY    . RE-EXCISION OF BREAST LUMPECTOMY Left 04/01/2016   Procedure: RE-EXCISION OF LEFT BREAST MEDIAL MARGIN;  Surgeon: Autumn Messing III, MD;  Location: [place];  Service: General;  Laterality: Left;   Patient Active Problem List   Diagnosis Date Noted  . Iron deficiency anemia 05/12/2019  . Elevated glucose level 04/15/2018  .  Encounter for screening mammogram for breast cancer 04/01/2017  . H/O syncope 02/28/2017  . Coronary artery disease of native artery of native heart with stable angina pectoris ([place]) 02/21/2017  . NSTEMI (non-ST elevated myocardial infarction) ([place]) 02/10/2017  . History of breast cancer 02/26/2016  . Varicose vein of leg 10/09/2015  . Pedal edema 10/09/2015  . Heart murmur 10/09/2015  . History of spinal fracture 05/07/2015  . Lumbar pain 05/03/2015  . Dysthymia 09/20/2013  . Encounter for  Medicare annual wellness exam 02/03/2013  . BPPV (benign paroxysmal positional vertigo) 06/15/2012  . Anemia, unspecified 01/17/2012  . Vitamin D deficiency 08/19/2008  . ANXIETY 01/29/2008  . Essential hypertension, benign 09/25/2007  . B12 deficiency 09/24/2006  . Hyperlipidemia 09/24/2006  . Venous (peripheral) insufficiency 09/24/2006  . ALLERGIC RHINITIS 09/24/2006  . GERD 09/24/2006  . Osteoporosis 09/24/2006      Prior to Admission medications   Medication Sig Start Date End Date Taking? Authorizing Provider  aspirin (ASPIRIN EC) 81 MG EC tablet Take 81 mg by mouth daily. Swallow whole.     [provider]  Cholecalciferol (VITAMIN D) 2000 UNITS CAPS Take 1 capsule by mouth daily.      [provider]  clopidogrel (PLAVIX) 75 MG tablet Take 1 tablet (75 mg total) by mouth daily with breakfast. 05/12/19   Tower, [name], MD  cyanocobalamin (,VITAMIN B-12,) 1000 MCG/ML injection Inject 1,000 mcg into the muscle every 3 (three) months.    [provider]  isosorbide mononitrate (IMDUR) 30 MG 24 hr tablet TAKE ONE-HALF TABLET BY MOUTH EVERY DAY 02/15/19   Gollan, Kathlene November, MD  nitroGLYCERIN (NITROSTAT) 0.4 MG SL tablet PLACE 1 TABLET UNDER TONGUE EVERY 5 MIN AS NEEDED FOR CHEST PAIN IF NO RELIEF IN15 MIN CALL 911 (MAX 3 TABS) 12/29/18   Gollan, Kathlene November, MD  omeprazole (PRILOSEC) 20 MG capsule Take 1 capsule (20 mg total) by mouth daily as needed. 04/21/19   Tower, [name], MD    Allergies Atorvastatin, Buspirone hcl, Codeine, Hydrochlorothiazide, Iron, Lansoprazole, Minocycline hcl, Penicillins, Phenergan [promethazine hcl], Ramipril, Ranexa [ranolazine], and Sulfa antibiotics    Social History Social History   Tobacco Use  . Smoking status: Former Smoker    Quit date: 03/11/1968    Years since quitting: 51.2  . Smokeless tobacco: Never Used  Substance Use Topics  . Alcohol use: No    Alcohol/week: 0.0 standard drinks  . Drug use: No    Review  of Systems Patient denies headaches, rhinorrhea, blurry vision, numbness, shortness of breath, chest pain, edema, cough, abdominal pain, nausea, vomiting, diarrhea, dysuria, fevers, rashes or hallucinations unless otherwise stated above in HPI. ____________________________________________   PHYSICAL EXAM:  VITAL SIGNS: Vitals:   05/21/19 0146 05/21/19 0147  BP:  (!) 189/85  Pulse:  (!) 110  Resp:  (!) 24  Temp: 98.2 F (36.8 C)   SpO2:  100%    Constitutional: Alert and oriented.  Eyes: Conjunctivae are normal.  Head: Atraumatic. Nose: No congestion/rhinnorhea. Mouth/Throat: Mucous membranes are moist.   Neck: No stridor. Painless ROM.  Cardiovascular: Normal rate, regular rhythm. Grossly normal heart sounds.  Good peripheral circulation. Respiratory: Normal respiratory effort.  No retractions. Lungs CTAB. Gastrointestinal: Soft and nontender. No distention. No abdominal bruits. No CVA tenderness. Genitourinary:  Musculoskeletal: No lower extremity tenderness nor edema.  No joint effusions. Neurologic:  Normal speech and language. No gross focal neurologic deficits are appreciated. No facial droop Skin:  Skin is warm, dry and  intact. No rash noted. Psychiatric: Mood and affect are normal. Speech and behavior are normal.  ____________________________________________   LABS (all labs ordered are listed, but only abnormal results are displayed)  Results for orders placed or performed during the hospital encounter of 05/21/19 (from the past 24 hour(s))  Respiratory Panel by RT PCR (Flu A&B, Covid) - Nasopharyngeal Swab     Status: None   Collection Time: 05/21/19  1:48 AM   Specimen: Nasopharyngeal Swab  Result Value Ref Range   SARS Coronavirus 2 by RT PCR NEGATIVE NEGATIVE   Influenza A by PCR NEGATIVE NEGATIVE   Influenza B by PCR NEGATIVE NEGATIVE  CBC with Differential/Platelet     Status: Abnormal   Collection Time: 05/21/19  1:48 AM  Result Value Ref Range   WBC  5.9 4.0 - 10.5 K/uL   RBC 4.27 3.87 - 5.11 MIL/uL   Hemoglobin 9.6 (L) 12.0 - 15.0 g/dL   HCT 31.9 (L) 36.0 - 46.0 %   MCV 74.7 (L) 80.0 - 100.0 fL   MCH 22.5 (L) 26.0 - 34.0 pg   MCHC 30.1 30.0 - 36.0 g/dL   RDW 17.8 (H) 11.5 - 15.5 %   Platelets 224 150 - 400 K/uL   nRBC 0.0 0.0 - 0.2 %   Neutrophils Relative % 46 %   Neutro Abs 2.8 1.7 - 7.7 K/uL   Lymphocytes Relative 43 %   Lymphs Abs 2.5 0.7 - 4.0 K/uL   Monocytes Relative 8 %   Monocytes Absolute 0.5 0.1 - 1.0 K/uL   Eosinophils Relative 2 %   Eosinophils Absolute 0.1 0.0 - 0.5 K/uL   Basophils Relative 1 %   Basophils Absolute 0.0 0.0 - 0.1 K/uL   Immature Granulocytes 0 %   Abs Immature Granulocytes 0.02 0.00 - 0.07 K/uL  Comprehensive metabolic panel     Status: Abnormal   Collection Time: 05/21/19  1:48 AM  Result Value Ref Range   Sodium 134 (L) 135 - 145 mmol/L   Potassium 3.5 3.5 - 5.1 mmol/L   Chloride 102 98 - 111 mmol/L   CO2 22 22 - 32 mmol/L   Glucose, Bld 115 (H) 70 - 99 mg/dL   BUN 7 (L) 8 - 23 mg/dL   Creatinine, Ser 0.87 0.44 - 1.00 mg/dL   Calcium 9.3 8.9 - 10.3 mg/dL   Total Protein 8.8 (H) 6.5 - 8.1 g/dL   Albumin 4.2 3.5 - 5.0 g/dL   AST 26 15 - 41 U/L   ALT 15 0 - 44 U/L   Alkaline Phosphatase 73 38 - 126 U/L   Total Bilirubin 0.8 0.3 - 1.2 mg/dL   GFR calc non Af Amer >60 >60 mL/min   GFR calc Af Amer >60 >60 mL/min   Anion gap 10 5 - 15  Troponin I (High Sensitivity)     Status: None   Collection Time: 05/21/19  1:48 AM  Result Value Ref Range   Troponin I (High Sensitivity) 13 <18 ng/L  Heparin level (unfractionated)     Status: Abnormal   Collection Time: 05/21/19  1:48 AM  Result Value Ref Range   Heparin Unfractionated <0.10 (L) 0.30 - 0.70 IU/mL  Protime-INR     Status: None   Collection Time: 05/21/19  1:48 AM  Result Value Ref Range   Prothrombin Time 14.0 11.4 - 15.2 seconds   INR 1.1 0.8 - 1.2  APTT     Status: None   Collection Time: 05/21/19  1:48 AM  Result Value Ref  Range   aPTT 29 24 - 36 seconds   ____________________________________________  EKG My review and personal interpretation at Time: 1:50   Indication: chest pain  Rate: 90  Rhythm: sinnus Axis: left Other: lbbb consistent with previous tracings, no stemi, no sgarbossa criteria ____________________________________________  RADIOLOGY  I personally reviewed all radiographic images ordered to evaluate for the above acute complaints and reviewed radiology reports and findings.  These findings were personally discussed with the patient.  Please see medical record for radiology report.  ____________________________________________   PROCEDURES  Procedure(s) performed:  .Critical Care Performed by: Merlyn Lot, MD Authorized by: Merlyn Lot, MD   Critical care provider statement:    Critical care time (minutes):  34   Critical care time was exclusive of:  Separately billable procedures and treating other patients   Critical care was necessary to treat or prevent imminent or life-threatening deterioration of the following conditions:  Cardiac failure   Critical care was time spent personally by me on the following activities:  Development of treatment plan with patient or surrogate, discussions with consultants, evaluation of patient's response to treatment, examination of patient, obtaining history from patient or surrogate, ordering and performing treatments and interventions, ordering and review of laboratory studies, ordering and review of radiographic studies, pulse oximetry, re-evaluation of patient's condition and review of old charts      Critical Care performed: yes ____________________________________________   INITIAL IMPRESSION / [place] / ED COURSE  Pertinent labs & imaging results that were available during my care of the patient were reviewed by me and considered in my medical decision making (see chart for details).   DDX: ACS, pericarditis,  esophagitis, boerhaaves, pe, dissection, pna, bronchitis, costochondritis   Hellon Luan is a 86 y.o. who presents to the ED with chest discomfort as described above.  Patient arrives to the ER pain-free.  EKG appears consistent with previous and does have a known left bundle branch block.  She is hypertensive may have some component of hypertensive urgency.  I discussed the case with Dr. Clayborn Bigness who is on-call for STEMI coverage.  As she is currently pain-free with no STEMI criteria on EKG.  Will hold off on emergent cath.  Will heparinize.  She did get aspirin in route.  Will give nitro.  Will continue observe.  She appears comfortable at this time.  Clinical Course as of May 21 323  Fri May 21, 2019  0235 High-sensitivity troponin is negative.  Blood work otherwise reassuring.  Patient reassessed and is currently pain-free.  Given her age and risk factors with known CAD will discuss with hospitalist for admission.  Have discussed with the patient and available family all diagnostics and treatments performed thus far and all questions were answered to the best of my ability. The patient demonstrates understanding and agreement with plan.    [PR]    Clinical Course User Index [PR] Merlyn Lot, MD    The patient was evaluated in Emergency Department today for the symptoms described in the history of present illness. He/she was evaluated in the context of the global COVID-19 pandemic, which necessitated consideration that the patient might be at risk for infection with the SARS-CoV-2 virus that causes COVID-19. Institutional protocols and algorithms that pertain to the evaluation of patients at risk for COVID-19 are in a state of rapid change based on information released by regulatory bodies including the CDC and federal and state organizations. These policies and  algorithms were followed during the patient's care in the ED.  As part of my medical decision making, I reviewed the  following data within the [place] notes reviewed and incorporated, Labs reviewed, notes from prior ED visits and  Controlled Substance Database   ____________________________________________   FINAL CLINICAL IMPRESSION(S) / ED DIAGNOSES  Final diagnoses:  Chest pain, unspecified type  Secondary hypertension      NEW MEDICATIONS STARTED DURING THIS VISIT:  New Prescriptions   No medications on file     Note:  This document was prepared using Dragon voice recognition software and may include unintentional dictation errors.    Merlyn Lot, MD 05/21/19 [phone]

## 2019-05-21 NOTE — Consult Note (Signed)
Cardiology Consultation:   Patient ID: Camellia Lehmkuhl MRN: [id number]; DOB: June 29, 1931  Admit date: 05/21/2019 Date of Consult: 05/21/2019  Primary Care Provider: Abner Greenspan, MD Primary Cardiologist: Ida Rogue, MD  Primary Electrophysiologist:  None    Patient Profile:   Leonah Stobbs is a 86 y.o. female with a hx of coronary artery disease who is being seen today for the evaluation of non-STEMI at the request of Dr. Sidney Ace.  History of Present Illness:   Ms. Michalec is an 86 year old female with known history of coronary artery disease.  She had non ST elevation myocardial infarction in December 2018.  She had cardiac catheterization done at that time which was personally reviewed by me today.  It showed severe subtotal occlusion of the mid right coronary artery which was heavily calcified.  Attempted PCI was not successful due to inability to cross with a balloon.  Thus, the patient was treated medically with plans to consider atherectomy if she continued to have anginal symptoms.  However, she stabilized with medical therapy and has done well since then.  She has multiple chronic medical conditions that include chronic anemia, essential hypertension and hyperlipidemia.  She had previous syncope felt to be due to orthostatic hypotension.  She was most recently seen by Dr. Candis Musa in September and was noted to have stable anginal symptoms responding to rest and occasionally sublingual nitroglycerin. Last night, she started having substernal chest tightness similar to her previous myocardial infarction around 10 PM.  She took 3 sublingual nitroglycerin and the pain was only partially relieved.  It was described as tightness and heavy feeling.  Her symptoms continued and thus she came to the ED for evaluation.  Her EKG showed sinus rhythm with LVH and QRS widening resampling left bundle branch block.  These EKG changes are not new.  Initial troponin was normal.  However, it gradually  increased since then and the most recent one was slightly above 1000.  She is currently chest pain-free.  CTA of the chest showed no evidence of aortic dissection.  She was hypertensive on presentation with blood pressure of 189/85.   Past Medical History:  Diagnosis Date  . Allergic rhinitis   . Anemia   . Anxiety   . GERD (gastroesophageal reflux disease)   . History of radiation therapy 04/29/16- 05/24/16   Left Breast 40.05 Gy in 15 fractions, Left Breast boost 10 Gy in 5 fractions.   . Hyperlipidemia   . Hypertension    treated by Dr Thurnell Garbe recently with syncope episode  . Osteopenia   . Osteoporosis   . Personal history of radiation therapy 2018    Past Surgical History:  Procedure Laterality Date  . APPENDECTOMY    . BREAST BIOPSY Left 11/29/2008  . BREAST BIOPSY Left 01/26/2016   malignant  . BREAST EXCISIONAL BIOPSY Left 12/26/2008  . BREAST LUMPECTOMY Left 02/27/2016  . BREAST LUMPECTOMY Left 04/01/2016  . BREAST LUMPECTOMY WITH RADIOACTIVE SEED LOCALIZATION Left 02/27/2016   Procedure: LEFT BREAST LUMPECTOMY WITH RADIOACTIVE SEED LOCALIZATION;  Surgeon: Autumn Messing III, MD;  Location: [facility];  Service: General;  Laterality: Left;  . BREAST SURGERY  10/10   benign biopsy  negative  . CESAREAN SECTION    . CHOLECYSTECTOMY    . CORONARY STENT INTERVENTION N/A 02/11/2017   Procedure: CORONARY STENT INTERVENTION;  Surgeon: Nelva Bush, MD;  Location: [facility];  Service: Cardiovascular;  Laterality: N/A;  . FEMUR FRACTURE SURGERY Right 2006  .  LEFT HEART CATH AND CORONARY ANGIOGRAPHY N/A 02/11/2017   Procedure: LEFT HEART CATH AND CORONARY ANGIOGRAPHY;  Surgeon: Nelva Bush, MD;  Location: [facility];  Service: Cardiovascular;  Laterality: N/A;  . OVARIAN CYST SURGERY    . RE-EXCISION OF BREAST LUMPECTOMY Left 04/01/2016   Procedure: RE-EXCISION OF LEFT BREAST MEDIAL MARGIN;  Surgeon: Autumn Messing III, MD;  Location: [facility];  Service: General;  Laterality: Left;     Home Medications:  Prior to Admission medications   Medication Sig Start Date End Date Taking? Authorizing Provider  aspirin (ASPIRIN EC) 81 MG EC tablet Take 81 mg by mouth daily. Swallow whole.    Yes [provider]  Cholecalciferol (VITAMIN D) 2000 UNITS CAPS Take 1 capsule by mouth daily.     Yes [provider]  clopidogrel (PLAVIX) 75 MG tablet Take 1 tablet (75 mg total) by mouth daily with breakfast. 05/12/19  Yes Tower, [name], MD  cyanocobalamin (,VITAMIN B-12,) 1000 MCG/ML injection Inject 1,000 mcg into the muscle every 3 (three) months.   Yes [provider]  isosorbide mononitrate (IMDUR) 30 MG 24 hr tablet TAKE ONE-HALF TABLET BY MOUTH EVERY DAY 02/15/19  Yes Gollan, Kathlene November, MD  nitroGLYCERIN (NITROSTAT) 0.4 MG SL tablet PLACE 1 TABLET UNDER TONGUE EVERY 5 MIN AS NEEDED FOR CHEST PAIN IF NO RELIEF IN15 MIN CALL 911 (MAX 3 TABS) 12/29/18  Yes Gollan, Kathlene November, MD  omeprazole (PRILOSEC) 20 MG capsule Take 1 capsule (20 mg total) by mouth daily as needed. 04/21/19  Yes Tower, [name], MD    Inpatient Medications: Scheduled Meds: . [START ON 05/22/2019] aspirin  81 mg Oral Pre-Cath  . aspirin EC  81 mg Oral Daily  . cholecalciferol  1,000 Units Oral Daily  . clopidogrel  75 mg Oral Q breakfast  . [START ON 05/24/2019] cyanocobalamin  1,000 mcg Intramuscular Q90 days  . isosorbide mononitrate  15 mg Oral Daily  . metoprolol tartrate  25 mg Oral BID  . pantoprazole  40 mg Oral Daily  . simvastatin  20 mg Oral q1800  . sodium chloride flush  3 mL Intravenous Q12H   Continuous Infusions: . sodium chloride 75 mL/hr at 05/21/19 0832  . sodium chloride    . sodium chloride    . heparin 800 Units/hr (05/21/19 0832)   PRN Meds: sodium chloride, acetaminophen, labetalol, morphine injection, nitroGLYCERIN, ondansetron (ZOFRAN) IV, sodium chloride flush  Allergies:    Allergies  Allergen  Reactions  . Atorvastatin     Made her confused and dizzy  . Buspirone Hcl     REACTION: HEART PALPITATIONS  . Codeine     REACTION: nausea and vomiting  . Hydrochlorothiazide     REACTION: syncope-possibly from dehydration  . Iron     Oral iron -per pt made her "pass out"   . Lansoprazole     REACTION: abd pain  . Minocycline Hcl   . Penicillins     REACTION: mouth numbness Has patient had a PCN reaction causing immediate rash, facial/tongue/throat swelling, SOB or lightheadedness with hypotension: No Has patient had a PCN reaction causing severe rash involving mucus membranes or skin necrosis: No Has patient had a PCN reaction that required hospitalization: No Has patient had a PCN reaction occurring within the last 10 years: No If all of the above answers are "NO", then may proceed with Cephalosporin use.  Marland Kitchen Phenergan [Promethazine Hcl] Other (See Comments)    Body shakes,  hallucinations  . Ramipril     Cough   . Ranexa [Ranolazine]     Constipation   . Sulfa Antibiotics Nausea Only    Social History:   Social History   Socioeconomic History  . Marital status: Widowed    Spouse name: Not on file  . Number of children: 1  . Years of education: Not on file  . Highest education level: Not on file  Occupational History    Employer: RETIRED  Tobacco Use  . Smoking status: Former Smoker    Quit date: 03/11/1968    Years since quitting: 51.2  . Smokeless tobacco: Never Used  Substance and Sexual Activity  . Alcohol use: No    Alcohol/week: 0.0 standard drinks  . Drug use: No  . Sexual activity: Not Currently  Other Topics Concern  . Not on file  Social History Narrative  . Not on file   Social Determinants of Health   Financial Resource Strain: Low Risk   . Difficulty of Paying Living Expenses: Not hard at all  Food Insecurity: No Food Insecurity  . Worried About Charity fundraiser in the Last Year: Never true  . Ran Out of Food in the Last Year: Never true    Transportation Needs: No Transportation Needs  . Lack of Transportation (Medical): No  . Lack of Transportation (Non-Medical): No  Physical Activity: Inactive  . Days of Exercise per Week: 0 days  . Minutes of Exercise per Session: 0 min  Stress: No Stress Concern Present  . Feeling of Stress : Not at all  Social Connections:   . Frequency of Communication with Friends and Family:   . Frequency of Social Gatherings with Friends and Family:   . Attends Religious Services:   . Active Member of Clubs or Organizations:   . Attends Archivist Meetings:   Marland Kitchen Marital Status:   Intimate Partner Violence: Not At Risk  . Fear of Current or Ex-Partner: No  . Emotionally Abused: No  . Physically Abused: No  . Sexually Abused: No    Family History:    Family History  Problem Relation Age of Onset  . Hypertension Mother   . Heart disease Father   . Macular degeneration Sister   . Diabetes Maternal Aunt   . Cancer Maternal Aunt        throat     ROS:  Please see the history of present illness.   All other ROS reviewed and negative.     Physical Exam/Data:   Vitals:   05/21/19 0515 05/21/19 0557 05/21/19 0643 05/21/19 0751  BP: (!) 147/73 (!) 155/66  (!) 144/69  Pulse: 87 90  84  Resp: 16 18  16   Temp:  98.2 F (36.8 C)  98.1 F (36.7 C)  TempSrc:  Oral    SpO2: 97% 100%  98%  Height:   5' (1.524 m)     Intake/Output Summary (Last 24 hours) at 05/21/2019 0907 Last data filed at 05/21/2019 [id number] Gross per 24 hour  Intake 313.67 ml  Output 400 ml  Net -86.33 ml   Last 3 Weights 05/12/2019 04/21/2019 04/14/2019  Weight (lbs) 133 lb 3.2 oz 132 lb 4 oz (No Data)  Weight (kg) 60.419 kg 59.988 kg (No Data)     Body mass index is 26.01 kg/m.  General:  Well nourished, well developed, in no acute distress HEENT: normal Lymph: no adenopathy Neck: no JVD Endocrine:  No thryomegaly Vascular: No carotid bruits;  FA pulses 2+ bilaterally without bruits  Cardiac:  normal  S1, S2; RRR; 2 out of 6 systolic murmur at the base which is early peaking. Lungs:  clear to auscultation bilaterally, no wheezing, rhonchi or rales  Abd: soft, nontender, no hepatomegaly  Ext: no edema Musculoskeletal:  No deformities, BUE and BLE strength normal and equal Skin: warm and dry  Neuro:  CNs 2-12 intact, no focal abnormalities noted Psych:  Normal affect  Right radial pulses normal.  EKG:  The EKG was personally reviewed and demonstrates: Normal sinus rhythm with LVH and QRS widening. Telemetry:  Telemetry was personally reviewed and demonstrates: Normal sinus rhythm  Relevant CV Studies:   Laboratory Data:  High Sensitivity Troponin:   Recent Labs  Lab 05/21/19 0148 05/21/19 0315 05/21/19 0703  TROPONINIHS 13 71* 1,084*     Chemistry Recent Labs  Lab 05/21/19 0148 05/21/19 0703  NA 134* 136  K 3.5 4.7  CL 102 105  CO2 22 23  GLUCOSE 115* 111*  BUN 7* 6*  CREATININE 0.87 0.81  CALCIUM 9.3 8.9  GFRNONAA >60 >60  GFRAA >60 >60  ANIONGAP 10 8    Recent Labs  Lab 05/21/19 0148  PROT 8.8*  ALBUMIN 4.2  AST 26  ALT 15  ALKPHOS 73  BILITOT 0.8   Hematology Recent Labs  Lab 05/21/19 0148  WBC 5.9  RBC 4.27  HGB 9.6*  HCT 31.9*  MCV 74.7*  MCH 22.5*  MCHC 30.1  RDW 17.8*  PLT 224   BNPNo results for input(s): BNP, PROBNP in the last 168 hours.  DDimer No results for input(s): DDIMER in the last 168 hours.   Radiology/Studies:  DG Chest Portable 1 View  Result Date: 05/21/2019 CLINICAL DATA:  86 year old female with chest pain since 20/2 100 hours. EXAM: PORTABLE CHEST 1 VIEW COMPARISON:  Portable chest 02/10/2017 and earlier. FINDINGS: Portable AP upright view at 0208 hours. Pacer or resuscitation pads project over the chest. Larger lung volumes. Stable mild cardiomegaly. Other mediastinal contours are within normal limits. Visualized tracheal air column is within normal limits. Chronic pulmonary interstitial thickening, although mildly  increased. No pneumothorax, pleural effusion or confluent pulmonary opacity. Stable left breast or chest wall surgical clips. Paucity of bowel gas. No acute osseous abnormality identified. IMPRESSION: 1. Chronic but increased since 2018 pulmonary interstitial opacity. Consider mild or developing interstitial edema, viral/atypical respiratory infection. Alternatively this might be progressed chronic lung disease. 2. Stable cardiomegaly.  No pleural fluid identified. Electronically Signed   By: Genevie Ann M.D.   On: 05/21/2019 02:45   CT ANGIO CHEST AORTA W/CM & OR WO/CM  Result Date: 05/21/2019 CLINICAL DATA:  Concern for aortic dissection. Chest pain. EXAM: CT ANGIOGRAPHY CHEST WITH CONTRAST TECHNIQUE: Multidetector CT imaging of the chest was performed using the standard protocol during bolus administration of intravenous contrast. Multiplanar CT image reconstructions and MIPs were obtained to evaluate the vascular anatomy. CONTRAST:  139mL OMNIPAQUE IOHEXOL 350 MG/ML SOLN COMPARISON:  CT and pelvis dated 05/06/2015 FINDINGS: Cardiovascular: There is no evidence for a thoracic aortic dissection or aneurysm. There are atherosclerotic changes throughout the thoracic aorta. There is no large centrally located pulmonary embolism. Mediastinum/Nodes: --No mediastinal or hilar lymphadenopathy. --No axillary lymphadenopathy. --No supraclavicular lymphadenopathy. --Normal thyroid gland. --The esophagus is unremarkable Lungs/Pleura: There is subpleural reticular changes bilaterally. There is no focal infiltrate or pneumothorax. The trachea is unremarkable. There is no pleural effusion. 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 No further follow-up is required. Musculoskeletal: There are stable appearing compression fractures of the T12 and L1 vertebral bodies. Review of the MIP images confirms the above findings. IMPRESSION: 1. No CT evidence for thoracic aortic dissection  or aneurysm. 2. No acute intrathoracic disease. 3. Stable appearing compression fractures of the T12 and L1 vertebral bodies. Aortic Atherosclerosis (ICD10-I70.0). Electronically Signed   By: Constance Holster M.D.   On: 05/21/2019 03:41       TIMI Risk Score for Unstable Angina or Non-ST Elevation MI:   The patient's TIMI risk score is 6, which indicates a 41% risk of all cause mortality, new or recurrent myocardial infarction or need for urgent revascularization in the next 14 days.   Assessment and Plan:   1. Non-ST elevation myocardial infarction: Known history of coronary artery disease with unsuccessful RCA PCI.  Given the patient's current presentation with myocardial infarction, recommend proceeding with left heart catheterization and possible PCI.  I discussed the procedure in details as well as risks and benefits.  In the meanwhile, continue dual antiplatelet therapy with aspirin and clopidogrel.  Continue heparin drip.  Further recommendations to follow after cardiac cath. 2. Essential hypertension with significantly elevated blood pressure on presentation: Blood pressure improved significantly since then.  Could have been related to her chest pain.  I would consider switching metoprolol to carvedilol or adding amlodipine for its antianginal effect. 3. Hyperlipidemia: Currently on simvastatin 20 mg daily.  Recent lipid profile showed an LDL of 121 and we should consider a more potent statin.      For questions or updates, please contact [facility] Please consult [email or web address] for contact info under     Signed, Kathlyn Sacramento, MD  05/21/2019 9:07 AM

## 2019-05-22 DIAGNOSIS — E7800 Pure hypercholesterolemia, unspecified: Secondary | ICD-10-CM

## 2019-05-22 DIAGNOSIS — I159 Secondary hypertension, unspecified: Secondary | ICD-10-CM

## 2019-05-22 DIAGNOSIS — I200 Unstable angina: Secondary | ICD-10-CM

## 2019-05-22 LAB — CBC
HCT: 28.1 % — ABNORMAL LOW (ref 36.0–46.0)
Hemoglobin: 8.4 g/dL — ABNORMAL LOW (ref 12.0–15.0)
MCH: 22.5 pg — ABNORMAL LOW (ref 26.0–34.0)
MCHC: 29.9 g/dL — ABNORMAL LOW (ref 30.0–36.0)
MCV: 75.1 fL — ABNORMAL LOW (ref 80.0–100.0)
Platelets: 198 10*3/uL (ref 150–400)
RBC: 3.74 MIL/uL — ABNORMAL LOW (ref 3.87–5.11)
RDW: 17.9 % — ABNORMAL HIGH (ref 11.5–15.5)
WBC: 5.1 10*3/uL (ref 4.0–10.5)
nRBC: 0 % (ref 0.0–0.2)

## 2019-05-22 LAB — ECHOCARDIOGRAM COMPLETE
Height: 60 in
Weight: 2052.92 oz

## 2019-05-22 MED ORDER — CARVEDILOL 6.25 MG PO TABS: 6.2500 mg | ORAL_TABLET | Freq: Two times a day (BID) | ORAL | 0 refills | Status: DC

## 2019-05-22 MED ORDER — ATORVASTATIN CALCIUM 20 MG PO TABS: 40.0000 mg | ORAL_TABLET | Freq: Every day | ORAL | Status: DC

## 2019-05-22 MED ORDER — ATORVASTATIN CALCIUM 40 MG PO TABS: 40.0000 mg | ORAL_TABLET | Freq: Every day | ORAL | 0 refills | Status: DC

## 2019-05-22 MED ORDER — LOSARTAN POTASSIUM 25 MG PO TABS: 25.0000 mg | ORAL_TABLET | Freq: Every day | ORAL | 0 refills | Status: DC

## 2019-05-22 NOTE — Plan of Care (Signed)

## 2019-05-22 NOTE — Progress Notes (Signed)
Statin addressed with on call Anne Ng.  No change made as cardiologist ordered the change of statin due to need for more potent statin.

## 2019-05-22 NOTE — Discharge Summary (Signed)
Physician Discharge Summary  [name] [id number] DOB: 09/04/1931 DOA: 05/21/2019  PCP: Abner Greenspan, MD  Admit date: 05/21/2019 Discharge date: 05/22/2019  Admitted From: Home Disposition: Home  Recommendations for Outpatient Follow-up:  1. Follow up with PCP in 1-2 weeks 2. Follow-up with cardiology in 1 week. 3. Follow up with your hematologist 4. Please obtain BMP/CBC in one week 5. Please follow up on the following pending results: Echocardiogram.  Home Health: No Equipment/Devices: None Discharge Condition: Stable CODE STATUS: Full Diet recommendation: Heart Healthy   Brief/Interim Summary: [name] an 86 y.o.Caucasian femalewith a known history of hypertension comes lipidemia, osteoporosis, anemia, anxiety, GERD and anemia, presented to the emergency room with acute onset ofchest pain graded 10/10 in severity and felt as pressure with no nausea vomiting or diaphoresis or dyspnea or palpitations. Troponin markedly elevated on trending, cardiology was consulted and she was taken for cardiac catheterization for NSTEMI.  Left heart catheterization shows CTO of RCA and OM1 with collaterals.  No intervention was done.  She was charted on dual antiplatelets and losartan was added.  She will continue with aspirin and statin and will follow up with cardiology as an outpatient. She was without any chest pain or shortness of breath on discharge.  Patient has an history of iron deficiency anemia.  Hemoglobin decreased to 8.4 after catheterization.  No obvious bleeding except during procedure.  She is allergic to oral iron and takes IV iron at cancer center by her hematologist.  She has an upcoming appointment on this Wednesday.  She does not want to take IV iron during current hospitalization and will go to cancer center on Wednesday for her regular iron supplement.  She will continue rest of her home meds.  Discharge Diagnoses:  Active Problems:   Non-ST elevation  (NSTEMI) myocardial infarction [facility])   Chest pain   NSTEMI (non-ST elevated myocardial infarction) [facility])  Discharge Instructions  Discharge Instructions    Diet - low sodium heart healthy   Complete by: As directed    Discharge instructions   Complete by: As directed    It was pleasure taking care of you. Your cardiologist added few new medications, please take it as directed. Please follow-up with your cardiologist within 1 week.   Increase activity slowly   Complete by: As directed      Allergies as of 05/22/2019      Reactions   Buspirone Hcl    REACTION: HEART PALPITATIONS   Codeine    REACTION: nausea and vomiting   Hydrochlorothiazide    REACTION: syncope-possibly from dehydration   Iron    Oral iron -per pt made her "pass out"    Lansoprazole    REACTION: abd pain   Minocycline Hcl    Penicillins    REACTION: mouth numbness Has patient had a PCN reaction causing immediate rash, facial/tongue/throat swelling, SOB or lightheadedness with hypotension: No Has patient had a PCN reaction causing severe rash involving mucus membranes or skin necrosis: No Has patient had a PCN reaction that required hospitalization: No Has patient had a PCN reaction occurring within the last 10 years: No If all of the above answers are "NO", then may proceed with Cephalosporin use.   Phenergan [promethazine Hcl] Other (See Comments)   Body shakes, hallucinations   Ramipril    Cough   Ranexa [ranolazine]    Constipation    Sulfa Antibiotics Nausea Only      Medication List    TAKE these medications   aspirin  EC 81 MG EC tablet Generic drug: aspirin Take 81 mg by mouth daily. Swallow whole.   atorvastatin 40 MG tablet Commonly known as: LIPITOR Take 1 tablet (40 mg total) by mouth daily at 6 PM.   carvedilol 6.25 MG tablet Commonly known as: COREG Take 1 tablet (6.25 mg total) by mouth 2 (two) times daily with a meal.   clopidogrel 75 MG tablet Commonly known as:  PLAVIX Take 1 tablet (75 mg total) by mouth daily with breakfast.   cyanocobalamin 1000 MCG/ML injection Commonly known as: (VITAMIN B-12) Inject 1,000 mcg into the muscle every 3 (three) months.   isosorbide mononitrate 30 MG 24 hr tablet Commonly known as: IMDUR TAKE ONE-HALF TABLET BY MOUTH EVERY DAY   losartan 25 MG tablet Commonly known as: COZAAR Take 1 tablet (25 mg total) by mouth daily. Start taking on: May 23, 2019   nitroGLYCERIN 0.4 MG SL tablet Commonly known as: NITROSTAT PLACE 1 TABLET UNDER TONGUE EVERY 5 MIN AS NEEDED FOR CHEST PAIN IF NO RELIEF IN15 MIN CALL 911 (MAX 3 TABS)   omeprazole 20 MG capsule Commonly known as: PRILOSEC Take 1 capsule (20 mg total) by mouth daily as needed.   Vitamin D 50 MCG (2000 UT) Caps Take 1 capsule by mouth daily.       Allergies  Allergen Reactions  . Buspirone Hcl     REACTION: HEART PALPITATIONS  . Codeine     REACTION: nausea and vomiting  . Hydrochlorothiazide     REACTION: syncope-possibly from dehydration  . Iron     Oral iron -per pt made her "pass out"   . Lansoprazole     REACTION: abd pain  . Minocycline Hcl   . Penicillins     REACTION: mouth numbness Has patient had a PCN reaction causing immediate rash, facial/tongue/throat swelling, SOB or lightheadedness with hypotension: No Has patient had a PCN reaction causing severe rash involving mucus membranes or skin necrosis: No Has patient had a PCN reaction that required hospitalization: No Has patient had a PCN reaction occurring within the last 10 years: No If all of the above answers are "NO", then may proceed with Cephalosporin use.  Marland Kitchen Phenergan [Promethazine Hcl] Other (See Comments)    Body shakes, hallucinations  . Ramipril     Cough   . Ranexa [Ranolazine]     Constipation   . Sulfa Antibiotics Nausea Only    Consultations:  Cardiology  Procedures/Studies: CARDIAC CATHETERIZATION  Result Date: 05/21/2019  Ost 1st Mrg lesion is  100% stenosed.  Prox RCA lesion is 30% stenosed.  Mid RCA-1 lesion is 80% stenosed.  Mid RCA-2 lesion is 100% stenosed.  LV end diastolic pressure is moderately elevated.  The left ventricular ejection fraction is 35-45% by visual estimate.  There is moderate left ventricular systolic dysfunction.  1.  Significant two-vessel coronary artery disease with heavily calcified arteries.  The right coronary artery which was severely diseased before is now completely occluded with good left-to-right collaterals.  In addition, OM1 is now occluded and previously had 90% stenosis.  This also has collaterals.  No other obstructive disease. 2.  Moderately reduced LV systolic function.  Moderately elevated left ventricular end-diastolic pressure at 22 mmHg. Recommendations: No revascularization is advised as the patient has collaterals. It is still possible that her myocardial infarction was due to supply demand ischemia in the setting of uncontrolled hypertension. In addition, she now has cardiomyopathy with moderately reduced EF.  Due to that and in  order to better control her blood pressure, I switch metoprolol to carvedilol and added small dose losartan.  These medications should be uptitrated for blood pressure control.  I ordered an echocardiogram to better evaluate her EF. The patient can likely be discharged home tomorrow.   DG Chest Portable 1 View  Result Date: 05/21/2019 CLINICAL DATA:  86 year old female with chest pain since 20/2 100 hours. EXAM: PORTABLE CHEST 1 VIEW COMPARISON:  Portable chest 02/10/2017 and earlier. FINDINGS: Portable AP upright view at 0208 hours. Pacer or resuscitation pads project over the chest. Larger lung volumes. Stable mild cardiomegaly. Other mediastinal contours are within normal limits. Visualized tracheal air column is within normal limits. Chronic pulmonary interstitial thickening, although mildly increased. No pneumothorax, pleural effusion or confluent pulmonary opacity.  Stable left breast or chest wall surgical clips. Paucity of bowel gas. No acute osseous abnormality identified. IMPRESSION: 1. Chronic but increased since 2018 pulmonary interstitial opacity. Consider mild or developing interstitial edema, viral/atypical respiratory infection. Alternatively this might be progressed chronic lung disease. 2. Stable cardiomegaly.  No pleural fluid identified. Electronically Signed   By: Genevie Ann M.D.   On: 05/21/2019 02:45   CT ANGIO CHEST AORTA W/CM & OR WO/CM  Result Date: 05/21/2019 CLINICAL DATA:  Concern for aortic dissection. Chest pain. EXAM: CT ANGIOGRAPHY CHEST WITH CONTRAST TECHNIQUE: Multidetector CT imaging of the chest was performed using the standard protocol during bolus administration of intravenous contrast. Multiplanar CT image reconstructions and MIPs were obtained to evaluate the vascular anatomy. CONTRAST:  153mL OMNIPAQUE IOHEXOL 350 MG/ML SOLN COMPARISON:  CT and pelvis dated 05/06/2015 FINDINGS: Cardiovascular: There is no evidence for a thoracic aortic dissection or aneurysm. There are atherosclerotic changes throughout the thoracic aorta. There is no large centrally located pulmonary embolism. Mediastinum/Nodes: --No mediastinal or hilar lymphadenopathy. --No axillary lymphadenopathy. --No supraclavicular lymphadenopathy. --Normal thyroid gland. --The esophagus is unremarkable Lungs/Pleura: There is subpleural reticular changes bilaterally. There is no focal infiltrate or pneumothorax. The trachea is unremarkable. There is no pleural effusion. 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 No further follow-up is required. Musculoskeletal: There are stable appearing compression fractures of the T12 and L1 vertebral bodies. Review of the MIP images confirms the above findings. IMPRESSION: 1. No CT evidence for thoracic aortic dissection or aneurysm. 2. No acute intrathoracic disease. 3. Stable appearing  compression fractures of the T12 and L1 vertebral bodies. Aortic Atherosclerosis (ICD10-I70.0). Electronically Signed   By: Constance Holster M.D.   On: 05/21/2019 03:41     Subjective: Patient was feeling better when seen during morning rounds.  No new complaints.  Denies any chest pain or shortness of breath.  She wants to go home.  Discharge Exam: Vitals:   05/22/19 0726 05/22/19 1112  BP: 122/70 (!) 113/52  Pulse: 65 74  Resp: 17 17  Temp: 97.6 F (36.4 C) 97.8 F (36.6 C)  SpO2: 96% 97%   Vitals:   05/22/19 0020 05/22/19 0528 05/22/19 0726 05/22/19 1112  BP: (!) 112/57 (!) 116/58 122/70 (!) 113/52  Pulse: 68 71 65 74  Resp: 16 15 17 17   Temp: 98.4 F (36.9 C) 98.4 F (36.9 C) 97.6 F (36.4 C) 97.8 F (36.6 C)  TempSrc: Oral Oral Oral   SpO2: 96% 95% 96% 97%  Weight:      Height:        General: Pt is alert, awake, not in acute distress Cardiovascular: RRR, S1/S2 +, no rubs,  no gallops Respiratory: CTA bilaterally, no wheezing, no rhonchi Abdominal: Soft, NT, ND, bowel sounds + Extremities: no edema, no cyanosis   The results of significant diagnostics from this hospitalization (including imaging, microbiology, ancillary and laboratory) are listed below for reference.    Microbiology: Recent Results (from the past 240 hour(s))  Respiratory Panel by RT PCR (Flu A&B, Covid) - Nasopharyngeal Swab     Status: None   Collection Time: 05/21/19  1:48 AM   Specimen: Nasopharyngeal Swab  Result Value Ref Range Status   SARS Coronavirus 2 by RT PCR NEGATIVE NEGATIVE Final    Comment: (NOTE) SARS-CoV-2 target nucleic acids are NOT DETECTED. The SARS-CoV-2 RNA is generally detectable in upper respiratoy specimens during the acute phase of infection. The lowest concentration of SARS-CoV-2 viral copies this assay can detect is 131 copies/mL. A negative result does not preclude SARS-Cov-2 infection and should not be used as the sole basis for treatment or other patient  management decisions. A negative result may occur with  improper specimen collection/handling, submission of specimen other than nasopharyngeal swab, presence of viral mutation(s) within the areas targeted by this assay, and inadequate number of viral copies (<131 copies/mL). A negative result must be combined with clinical observations, patient history, and epidemiological information. The expected result is Negative. Fact Sheet for Patients:  PinkCheek.be Fact Sheet for Healthcare Providers:  GravelBags.it This test is not yet ap proved or cleared by the [place] FDA and  has been authorized for detection and/or diagnosis of SARS-CoV-2 by FDA under an Emergency Use Authorization (EUA). This EUA will remain  in effect (meaning this test can be used) for the duration of the COVID-19 declaration under Section 564(b)(1) of the Act, 21 U.S.C. section 360bbb-3(b)(1), unless the authorization is terminated or revoked sooner.    Influenza A by PCR NEGATIVE NEGATIVE Final   Influenza B by PCR NEGATIVE NEGATIVE Final    Comment: (NOTE) The Xpert Xpress SARS-CoV-2/FLU/RSV assay is intended as an aid in  the diagnosis of influenza from Nasopharyngeal swab specimens and  should not be used as a sole basis for treatment. Nasal washings and  aspirates are unacceptable for Xpert Xpress SARS-CoV-2/FLU/RSV  testing. Fact Sheet for Patients: PinkCheek.be Fact Sheet for Healthcare Providers: GravelBags.it This test is not yet approved or cleared by the [place] FDA and  has been authorized for detection and/or diagnosis of SARS-CoV-2 by  FDA under an Emergency Use Authorization (EUA). This EUA will remain  in effect (meaning this test can be used) for the duration of the  Covid-19 declaration under Section 564(b)(1) of the Act, 21  U.S.C. section 360bbb-3(b)(1), unless the  authorization is  terminated or revoked. Performed at [facility], [address] [place], [place] [address]      Labs: BNP (last 3 results) No results for input(s): BNP in the last 8760 hours. Basic Metabolic Panel: Recent Labs  Lab 05/21/19 0148 05/21/19 0703  NA 134* 136  K 3.5 4.7  CL 102 105  CO2 22 23  GLUCOSE 115* 111*  BUN 7* 6*  CREATININE 0.87 0.81  CALCIUM 9.3 8.9  MG  --  2.0   Liver Function Tests: Recent Labs  Lab 05/21/19 0148  AST 26  ALT 15  ALKPHOS 73  BILITOT 0.8  PROT 8.8*  ALBUMIN 4.2   No results for input(s): LIPASE, AMYLASE in the last 168 hours. No results for input(s): AMMONIA in the last 168 hours. CBC: Recent Labs  Lab 05/21/19  0148 05/22/19 0546  WBC 5.9 5.1  NEUTROABS 2.8  --   HGB 9.6* 8.4*  HCT 31.9* 28.1*  MCV 74.7* 75.1*  PLT 224 198   Cardiac Enzymes: No results for input(s): CKTOTAL, CKMB, CKMBINDEX, TROPONINI in the last 168 hours. BNP: Invalid input(s): POCBNP CBG: No results for input(s): GLUCAP in the last 168 hours. D-Dimer No results for input(s): DDIMER in the last 72 hours. Hgb A1c No results for input(s): HGBA1C in the last 72 hours. Lipid Profile No results for input(s): CHOL, HDL, LDLCALC, TRIG, CHOLHDL, LDLDIRECT in the last 72 hours. Thyroid function studies No results for input(s): TSH, T4TOTAL, T3FREE, THYROIDAB in the last 72 hours.  Invalid input(s): FREET3 Anemia work up No results for input(s): VITAMINB12, FOLATE, FERRITIN, TIBC, IRON, RETICCTPCT in the last 72 hours. Urinalysis    Component Value Date/Time   COLORURINE YELLOW (A) 10/16/2017 1048   APPEARANCEUR CLEAR (A) 10/16/2017 1048   LABSPEC 1.009 10/16/2017 1048   PHURINE 7.0 10/16/2017 1048   GLUCOSEU NEGATIVE 10/16/2017 1048   HGBUR NEGATIVE 10/16/2017 1048   BILIRUBINUR NEGATIVE 10/16/2017 1048   BILIRUBINUR 2+ 03/18/2016 1447   KETONESUR NEGATIVE 10/16/2017 1048   PROTEINUR NEGATIVE 10/16/2017 1048    UROBILINOGEN 4.0 03/18/2016 1447   UROBILINOGEN 0.2 11/13/2008 2332   NITRITE NEGATIVE 10/16/2017 1048   LEUKOCYTESUR SMALL (A) 10/16/2017 1048   Sepsis Labs Invalid input(s): PROCALCITONIN,  WBC,  LACTICIDVEN Microbiology Recent Results (from the past 240 hour(s))  Respiratory Panel by RT PCR (Flu A&B, Covid) - Nasopharyngeal Swab     Status: None   Collection Time: 05/21/19  1:48 AM   Specimen: Nasopharyngeal Swab  Result Value Ref Range Status   SARS Coronavirus 2 by RT PCR NEGATIVE NEGATIVE Final    Comment: (NOTE) SARS-CoV-2 target nucleic acids are NOT DETECTED. The SARS-CoV-2 RNA is generally detectable in upper respiratoy specimens during the acute phase of infection. The lowest concentration of SARS-CoV-2 viral copies this assay can detect is 131 copies/mL. A negative result does not preclude SARS-Cov-2 infection and should not be used as the sole basis for treatment or other patient management decisions. A negative result may occur with  improper specimen collection/handling, submission of specimen other than nasopharyngeal swab, presence of viral mutation(s) within the areas targeted by this assay, and inadequate number of viral copies (<131 copies/mL). A negative result must be combined with clinical observations, patient history, and epidemiological information. The expected result is Negative. Fact Sheet for Patients:  PinkCheek.be Fact Sheet for Healthcare Providers:  GravelBags.it This test is not yet ap proved or cleared by the [place] FDA and  has been authorized for detection and/or diagnosis of SARS-CoV-2 by FDA under an Emergency Use Authorization (EUA). This EUA will remain  in effect (meaning this test can be used) for the duration of the COVID-19 declaration under Section 564(b)(1) of the Act, 21 U.S.C. section 360bbb-3(b)(1), unless the authorization is terminated or revoked sooner.     Influenza A by PCR NEGATIVE NEGATIVE Final   Influenza B by PCR NEGATIVE NEGATIVE Final    Comment: (NOTE) The Xpert Xpress SARS-CoV-2/FLU/RSV assay is intended as an aid in  the diagnosis of influenza from Nasopharyngeal swab specimens and  should not be used as a sole basis for treatment. Nasal washings and  aspirates are unacceptable for Xpert Xpress SARS-CoV-2/FLU/RSV  testing. Fact Sheet for Patients: PinkCheek.be Fact Sheet for Healthcare Providers: GravelBags.it This test is not yet approved or cleared by the [place] FDA  and  has been authorized for detection and/or diagnosis of SARS-CoV-2 by  FDA under an Emergency Use Authorization (EUA). This EUA will remain  in effect (meaning this test can be used) for the duration of the  Covid-19 declaration under Section 564(b)(1) of the Act, 21  U.S.C. section 360bbb-3(b)(1), unless the authorization is  terminated or revoked. Performed at [facility], [address] [place], [place] [address]     Time coordinating discharge: Over 30 minutes  SIGNED:  Lorella Nimrod, MD  [facility] 05/22/2019, 2:35 PM  If 7PM-7AM, please contact night-coverage [email or web address]  This record has been created using Systems analyst. Errors have been sought and corrected,but may not always be located. Such creation errors do not reflect on the standard of care.

## 2019-05-22 NOTE — Progress Notes (Signed)
Progress Note  Patient Name: Jenna Young Date of Encounter: 05/22/2019  Primary Cardiologist: Ida Rogue, MD   Subjective   Patient seen this morning with daughter at bedside.  Denies chest pain or shortness of breath.  Had left heart cath yesterday with CTO of RCA and OM1 with collaterals.  Inpatient Medications    Scheduled Meds: . aspirin EC  81 mg Oral Daily  . atorvastatin  40 mg Oral q1800  . carvedilol  6.25 mg Oral BID WC  . cholecalciferol  1,000 Units Oral Daily  . clopidogrel  75 mg Oral Q breakfast  . [START ON 05/24/2019] cyanocobalamin  1,000 mcg Intramuscular Q90 days  . isosorbide mononitrate  15 mg Oral Daily  . losartan  25 mg Oral Daily  . pantoprazole  40 mg Oral Daily  . sodium chloride flush  3 mL Intravenous Q12H  . sodium chloride flush  3 mL Intravenous Q12H   Continuous Infusions: . sodium chloride Stopped (05/21/19 2010)  . sodium chloride     PRN Meds: sodium chloride, acetaminophen, morphine injection, nitroGLYCERIN, ondansetron (ZOFRAN) IV, sodium chloride flush   Vital Signs    Vitals:   05/22/19 0020 05/22/19 0528 05/22/19 0726 05/22/19 1112  BP: (!) 112/57 (!) 116/58 122/70 (!) 113/52  Pulse: 68 71 65 74  Resp: 16 15 17 17   Temp: 98.4 F (36.9 C) 98.4 F (36.9 C) 97.6 F (36.4 C) 97.8 F (36.6 C)  TempSrc: Oral Oral Oral   SpO2: 96% 95% 96% 97%  Weight:      Height:        Intake/Output Summary (Last 24 hours) at 05/22/2019 1243 Last data filed at 05/22/2019 1117 Gross per 24 hour  Intake 120 ml  Output 1500 ml  Net -1380 ml   Last 3 Weights 05/21/2019 05/21/2019 05/12/2019  Weight (lbs) 128 lb 4.9 oz 128 lb 4.8 oz 133 lb 3.2 oz  Weight (kg) 58.2 kg 58.196 kg 60.419 kg      Telemetry    Sinus rhythm heart rate 72- Personally Reviewed  ECG    No new tracing obtained- Personally Reviewed  Physical Exam   GEN: No acute distress.   Neck: No JVD Cardiac: RRR, no murmurs, rubs, or gallops.  Respiratory:  Clear to auscultation bilaterally. GI: Soft, nontender, non-distended  MS: No edema; No deformity. Neuro:  Nonfocal  Psych: Normal affect   Labs    High Sensitivity Troponin:   Recent Labs  Lab 05/21/19 0148 05/21/19 0315 05/21/19 0703 05/21/19 1442  TROPONINIHS 13 71* 1,084* 3,104*      Chemistry Recent Labs  Lab 05/21/19 0148 05/21/19 0703  NA 134* 136  K 3.5 4.7  CL 102 105  CO2 22 23  GLUCOSE 115* 111*  BUN 7* 6*  CREATININE 0.87 0.81  CALCIUM 9.3 8.9  PROT 8.8*  --   ALBUMIN 4.2  --   AST 26  --   ALT 15  --   ALKPHOS 73  --   BILITOT 0.8  --   GFRNONAA >60 >60  GFRAA >60 >60  ANIONGAP 10 8     Hematology Recent Labs  Lab 05/21/19 0148 05/22/19 0546  WBC 5.9 5.1  RBC 4.27 3.74*  HGB 9.6* 8.4*  HCT 31.9* 28.1*  MCV 74.7* 75.1*  MCH 22.5* 22.5*  MCHC 30.1 29.9*  RDW 17.8* 17.9*  PLT 224 198    BNPNo results for input(s): BNP, PROBNP in the last 168 hours.   DDimer  No results for input(s): DDIMER in the last 168 hours.   Radiology    CARDIAC CATHETERIZATION  Result Date: 05/21/2019  Ost 1st Mrg lesion is 100% stenosed.  Prox RCA lesion is 30% stenosed.  Mid RCA-1 lesion is 80% stenosed.  Mid RCA-2 lesion is 100% stenosed.  LV end diastolic pressure is moderately elevated.  The left ventricular ejection fraction is 35-45% by visual estimate.  There is moderate left ventricular systolic dysfunction.  1.  Significant two-vessel coronary artery disease with heavily calcified arteries.  The right coronary artery which was severely diseased before is now completely occluded with good left-to-right collaterals.  In addition, OM1 is now occluded and previously had 90% stenosis.  This also has collaterals.  No other obstructive disease. 2.  Moderately reduced LV systolic function.  Moderately elevated left ventricular end-diastolic pressure at 22 mmHg. Recommendations: No revascularization is advised as the patient has collaterals. It is still  possible that her myocardial infarction was due to supply demand ischemia in the setting of uncontrolled hypertension. In addition, she now has cardiomyopathy with moderately reduced EF.  Due to that and in order to better control her blood pressure, I switch metoprolol to carvedilol and added small dose losartan.  These medications should be uptitrated for blood pressure control.  I ordered an echocardiogram to better evaluate her EF. The patient can likely be discharged home tomorrow.   DG Chest Portable 1 View  Result Date: 05/21/2019 CLINICAL DATA:  86 year old female with chest pain since 20/2 100 hours. EXAM: PORTABLE CHEST 1 VIEW COMPARISON:  Portable chest 02/10/2017 and earlier. FINDINGS: Portable AP upright view at 0208 hours. Pacer or resuscitation pads project over the chest. Larger lung volumes. Stable mild cardiomegaly. Other mediastinal contours are within normal limits. Visualized tracheal air column is within normal limits. Chronic pulmonary interstitial thickening, although mildly increased. No pneumothorax, pleural effusion or confluent pulmonary opacity. Stable left breast or chest wall surgical clips. Paucity of bowel gas. No acute osseous abnormality identified. IMPRESSION: 1. Chronic but increased since 2018 pulmonary interstitial opacity. Consider mild or developing interstitial edema, viral/atypical respiratory infection. Alternatively this might be progressed chronic lung disease. 2. Stable cardiomegaly.  No pleural fluid identified. Electronically Signed   By: Genevie Ann M.D.   On: 05/21/2019 02:45   CT ANGIO CHEST AORTA W/CM & OR WO/CM  Result Date: 05/21/2019 CLINICAL DATA:  Concern for aortic dissection. Chest pain. EXAM: CT ANGIOGRAPHY CHEST WITH CONTRAST TECHNIQUE: Multidetector CT imaging of the chest was performed using the standard protocol during bolus administration of intravenous contrast. Multiplanar CT image reconstructions and MIPs were obtained to evaluate the vascular  anatomy. CONTRAST:  116mL OMNIPAQUE IOHEXOL 350 MG/ML SOLN COMPARISON:  CT and pelvis dated 05/06/2015 FINDINGS: Cardiovascular: There is no evidence for a thoracic aortic dissection or aneurysm. There are atherosclerotic changes throughout the thoracic aorta. There is no large centrally located pulmonary embolism. Mediastinum/Nodes: --No mediastinal or hilar lymphadenopathy. --No axillary lymphadenopathy. --No supraclavicular lymphadenopathy. --Normal thyroid gland. --The esophagus is unremarkable Lungs/Pleura: There is subpleural reticular changes bilaterally. There is no focal infiltrate or pneumothorax. The trachea is unremarkable. There is no pleural effusion. 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 No further follow-up is required. Musculoskeletal: There are stable appearing compression fractures of the T12 and L1 vertebral bodies. Review of the MIP images confirms the above findings. IMPRESSION: 1. No CT evidence for thoracic aortic dissection or aneurysm. 2. No acute intrathoracic disease.  3. Stable appearing compression fractures of the T12 and L1 vertebral bodies. Aortic Atherosclerosis (ICD10-I70.0). Electronically Signed   By: Constance Holster M.D.   On: 05/21/2019 03:41    Cardiac Studies   Left heart cath 05/21/2019  Ost 1st Mrg lesion is 100% stenosed.  Prox RCA lesion is 30% stenosed.  Mid RCA-1 lesion is 80% stenosed.  Mid RCA-2 lesion is 100% stenosed.  LV end diastolic pressure is moderately elevated.  The left ventricular ejection fraction is 35-45% by visual estimate.  There is moderate left ventricular systolic dysfunction.   1.  Significant two-vessel coronary artery disease with heavily calcified arteries.  The right coronary artery which was severely diseased before is now completely occluded with good left-to-right collaterals.  In addition, OM1 is now occluded and previously had 90% stenosis.  This also has  collaterals.  No other obstructive disease. 2.  Moderately reduced LV systolic function.  Moderately elevated left ventricular end-diastolic pressure at 22 mmHg.  Patient Profile     86 y.o. female with history of coronary artery disease who presents with chest pain.  Diagnosed with NSTEMI now with occluded mid RCA, occluded first obtuse marginal, both arteries with good collaterals.  Assessment & Plan    1.  NSTEMI -Left heart cath with CTO of OM1 and RCA, good collaterals -Medical management with dual antiplatelet therapy aspirin, Plavix -Continue losartan, Coreg. -Patient can be discharged on current medications.  Follow-up in the office with Dr. Rockey Situ. -Last echocardiogram showed low normal ejection fraction.  EF 50 to 55%.  Repeat pending, will review.  2.  History of hyperlipidemia -Start Lipitor 40 mg daily.      Signed, Kate Sable, MD  05/22/2019, 12:43 PM

## 2019-05-24 ENCOUNTER — Telehealth: Payer: Self-pay

## 2019-05-24 NOTE — Telephone Encounter (Signed)
Transition Care Management Follow-up Telephone Call  Date of discharge and from where: 05/22/2019, [facility]  How have you been since you were released from the hospital? Patient states that she is feeling much better. No complaints at this time.  Any questions or concerns? No   Items Reviewed:  Did the pt receive and understand the discharge instructions provided? Yes   Medications obtained and verified? Yes   Any new allergies since your discharge? No   Dietary orders reviewed? Yes  Do you have support at home? Yes   Functional Questionnaire: (I = Independent and D = Dependent) ADLs: I  Bathing/Dressing- I  Meal Prep- I  Eating- I  Maintaining continence- I  Transferring/Ambulation- I  Managing Meds- I  Follow up appointments reviewed:   PCP Hospital f/u appt confirmed? Yes  Scheduled to see Dr. Glori Bickers on 05/28/2019 @ 11:30 am.  [facility] f/u appt confirmed? Yes  Scheduled to see cardiology on 05/31/2019.  Are transportation arrangements needed? No   If their condition worsens, is the pt aware to call PCP or go to the Emergency Dept.? Yes  Was the patient provided with contact information for the PCP's office or ED? Yes  Was to pt encouraged to call back with questions or concerns? Yes

## 2019-05-25 ENCOUNTER — Other Ambulatory Visit: Payer: Self-pay

## 2019-05-25 DIAGNOSIS — H02055 Trichiasis without entropian left lower eyelid: Secondary | ICD-10-CM | POA: Diagnosis not present

## 2019-05-25 DIAGNOSIS — S0502XA Injury of conjunctiva and corneal abrasion without foreign body, left eye, initial encounter: Secondary | ICD-10-CM | POA: Diagnosis not present

## 2019-05-25 DIAGNOSIS — Z961 Presence of intraocular lens: Secondary | ICD-10-CM | POA: Diagnosis not present

## 2019-05-25 NOTE — Progress Notes (Signed)
Office Visit    Patient Name: Jenna Young Date of Encounter: 05/31/2019  Primary Care Provider:  Abner Greenspan, MD Primary Cardiologist:  Ida Rogue, MD  Chief Complaint    Chief Complaint  Patient presents with  . office visit    Pt has no complaints. Meds verbally reviewed w/ pt.    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  Past Medical History    Past Medical History:  Diagnosis Date  . Allergic rhinitis   . Anemia   . Anxiety   . GERD (gastroesophageal reflux disease)   . History of radiation therapy 04/29/16- 05/24/16   Left Breast 40.05 Gy in 15 fractions, Left Breast boost 10 Gy in 5 fractions.   . Hyperlipidemia   . Hypertension    treated by Dr Thurnell Garbe recently with syncope episode  . Osteopenia   . Osteoporosis   . Personal history of radiation therapy 2018   Past Surgical History:  Procedure Laterality Date  . APPENDECTOMY    . BREAST BIOPSY Left 11/29/2008  . BREAST BIOPSY Left 01/26/2016   malignant  . BREAST EXCISIONAL BIOPSY Left 12/26/2008  . BREAST LUMPECTOMY Left 02/27/2016  . BREAST LUMPECTOMY Left 04/01/2016  . BREAST LUMPECTOMY WITH RADIOACTIVE SEED LOCALIZATION Left 02/27/2016   Procedure: LEFT BREAST LUMPECTOMY WITH RADIOACTIVE SEED LOCALIZATION;  Surgeon: Autumn Messing III, MD;  Location: [facility];  Service: General;  Laterality: Left;  . BREAST SURGERY  10/10   benign biopsy  negative  . CESAREAN SECTION    . CHOLECYSTECTOMY    . CORONARY STENT INTERVENTION N/A 02/11/2017   Procedure: CORONARY STENT INTERVENTION;  Surgeon: Nelva Bush, MD;  Location: [facility];  Service:  Cardiovascular;  Laterality: N/A;  . FEMUR FRACTURE SURGERY Right 2006  . LEFT HEART CATH AND CORONARY ANGIOGRAPHY N/A 02/11/2017   Procedure: LEFT HEART CATH AND CORONARY ANGIOGRAPHY;  Surgeon: Nelva Bush, MD;  Location: [facility];  Service: Cardiovascular;  Laterality: N/A;  . LEFT HEART CATH AND CORONARY ANGIOGRAPHY N/A 05/21/2019   Procedure: LEFT HEART CATH AND CORONARY ANGIOGRAPHY;  Surgeon: Wellington Hampshire, MD;  Location: [facility];  Service: Cardiovascular;  Laterality: N/A;  . OVARIAN CYST SURGERY    . RE-EXCISION OF BREAST LUMPECTOMY Left 04/01/2016   Procedure: RE-EXCISION OF LEFT BREAST MEDIAL MARGIN;  Surgeon: Autumn Messing III, MD;  Location: [facility];  Service: General;  Laterality: Left;    Allergies  Allergies  Allergen Reactions  . Buspirone Hcl     REACTION: HEART PALPITATIONS  . Codeine     REACTION: nausea and vomiting  . Hydrochlorothiazide     REACTION: syncope-possibly from dehydration  . Iron     Oral iron -per pt made her "pass out"   . Lansoprazole     REACTION: abd pain  . Minocycline Hcl   . Penicillins     REACTION: mouth numbness Has patient had a PCN reaction causing immediate rash, facial/tongue/throat swelling, SOB or lightheadedness with hypotension: No Has patient had a PCN reaction causing severe rash involving mucus membranes or skin necrosis: No Has patient had a PCN reaction that required hospitalization: No Has patient had a PCN reaction occurring within the last 10 years:  No If all of the above answers are "NO", then may proceed with Cephalosporin use.  Marland Kitchen Phenergan [Promethazine Hcl] Other (See Comments)    Body shakes, hallucinations  . Ramipril     Cough   . Ranexa [Ranolazine]     Constipation   . Sulfa Antibiotics Nausea Only    History of Present Illness    Jenna Young is a 86 y.o. female with PMH as above.  She had a non-ST elevation myocardial infarction 02/2017 with LHC at  that time that showed severe subtotal occlusion of mRCA, which was heavily calcified.  PCI was attempted but not successful due to inability to cross with balloon.  She was therefore treated medically with plans to consider atherectomy if she continued to experience angina.  She stabilized with medical therapy after this time. She was seen in the office 11/2018 and felt to be doing well.  She reported stable anginal symptoms that responded to rest and occasional SL nitro.    On 05/20/2019, she started to feel substernal chest tightness / heaviness, similar to her previous NSTEMI and only partially relieved with 3 SL nitro. When her sx continued, she presented to [facility] ED. She was hypertensive at presentation with BP 189/85. CTA without evidence of Ao dissection. EKG showed NSR with LVH and QRS widening resembling LBBB. These changes were noted to bot be new. Initial Tn was negative but gradually increased with subsequent Tn above 1000. LHC was recommended and showed significant two-vessel CAD with heavily calcified arteries.  The RCA, which was previously severely diseased, was now occluded with left to right collaterals.  OM1, previously 90% stenosed, was also noted to be occluded with collaterals.  No other obstructive disease was noted.  It was also noted she had moderately reduced LVSF with LVEDP 22 mmHg. Given her collaterals, no revascularization / intervention was performed.  It was noted that her non-ST elevation myocardial infarction could be due to supply demand ischemia in the setting of uncontrolled hypertension.  Due to her moderately reduced EF, metoprolol was transitioned to carvedilol with addition of low-dose losartan.  It was noted these medications should be uptitrated for adequate BP control.  Echo was performed as below with EF 40 to 45%, LV global hypokinesis, mild LVH, G1DD, mild LAE, trivial MR/AR.  Today, she presents to clinic and states she is doing well from a cardiac standpoint.  She  denies any chest pain, racing heart rate, or palpitations.  No shortness of breath or dyspnea on exertion.  She does report bilateral leg weakness, noted on the walk in today.  On review of previous cardiologist notes, this has been reported in the past. Today, she reports that this has been ongoing since she was admitted to the hospital for her most recent non-ST elevation MI.  She denies any orthopnea, PND, lower extremity edema, abdominal distention, or early satiety.  She reports some fatigue.  She has not yet enrolled in cardiac rehab and requests that we enroll her today.  She denies any significant current right radial arteriotomy discomfort, stating that she had a bump / pain earlier on.  She has been sleeping well.  She reports drinking 3 small water bottles daily.  She rarely add salt to her food, referencing adding occasional salt to her [place] fries.  She does not drink alcohol, smoke, or use any illegal drugs.  She drinks noncaffeinated coffee.  She reports medication compliance.  No signs or symptoms consistent with bleeding.  On exam today,  bilateral lower extremity edema was noted, which the patient had not noticed herself. She does have a history of venous insufficiency.  She reported that she was checking her blood pressure at home and had a blood pressure log; however, she has not done this since her most recent admission.  She is agreeable to restart her blood pressure log and start daily weights as well.  Blood pressure today controlled with BP 120/48 and HR 57 bpm.  Weight stable from previous visit at 133 pounds.  Home Medications    Prior to Admission medications   Medication Sig Start Date End Date Taking? Authorizing Provider  aspirin (ASPIRIN EC) 81 MG EC tablet Take 81 mg by mouth daily. Swallow whole.     [provider]  atorvastatin (LIPITOR) 40 MG tablet Take 1 tablet (40 mg total) by mouth daily at 6 PM. 05/22/19   Lorella Nimrod, MD  carvedilol (COREG) 6.25 MG tablet  Take 1 tablet (6.25 mg total) by mouth 2 (two) times daily with a meal. 05/22/19   Lorella Nimrod, MD  Cholecalciferol (VITAMIN D) 2000 UNITS CAPS Take 1 capsule by mouth daily.      [provider]  clopidogrel (PLAVIX) 75 MG tablet Take 1 tablet (75 mg total) by mouth daily with breakfast. 05/12/19   Tower, [name], MD  cyanocobalamin (,VITAMIN B-12,) 1000 MCG/ML injection Inject 1,000 mcg into the muscle every 3 (three) months.    [provider]  isosorbide mononitrate (IMDUR) 30 MG 24 hr tablet TAKE ONE-HALF TABLET BY MOUTH EVERY DAY 02/15/19   Minna Merritts, MD  losartan (COZAAR) 25 MG tablet Take 1 tablet (25 mg total) by mouth daily. 05/23/19   Lorella Nimrod, MD  nitroGLYCERIN (NITROSTAT) 0.4 MG SL tablet PLACE 1 TABLET UNDER TONGUE EVERY 5 MIN AS NEEDED FOR CHEST PAIN IF NO RELIEF IN15 MIN CALL 911 (MAX 3 TABS) 12/29/18   Gollan, Kathlene November, MD  omeprazole (PRILOSEC) 20 MG capsule Take 1 capsule (20 mg total) by mouth daily as needed. 04/21/19   Tower, [name], MD    Review of Systems    She denies chest pain, palpitations, dyspnea, pnd, orthopnea, n, v, dizziness, syncope, edema, weight gain, or early satiety.  She reports ongoing bilateral lower extremity weakness and some fatigue.   All other systems reviewed and are otherwise negative except as noted above.  Physical Exam    VS:  BP (!) 120/48 (BP Location: Left Arm, Patient Position: Sitting, Cuff Size: Normal)   Pulse (!) 57   Ht 5' (1.524 m)   Wt 133 lb 2 oz (60.4 kg)   SpO2 99%   BMI 26.00 kg/m  , BMI Body mass index is 26 kg/m. GEN: Well nourished, well developed, in no acute distress. HEENT: normal. Neck: Supple, no JVD, carotid bruits, or masses. Cardiac: bradycardic but regular, 2/6 systolic and earl peaking murmur at the base, no murmurs, rubs, or gallops. No clubbing, cyanosis.  Mild nonpitting bilateral lower extremity edema.  Radials/DP/PT 2+ and equal bilaterally.  Right radial arteriotomy site  without hematoma, swelling, or signs of infection. Respiratory:  Respirations regular and unlabored, clear to auscultation bilaterally. GI: Soft, nontender, nondistended, BS + x 4. MS: no deformity or atrophy. Skin: warm and dry, no rash. Neuro:  Strength and sensation are intact. Psych: Normal affect.  Accessory Clinical Findings    ECG personally reviewed by me today - SB, 57bpm, LVH, QRS widening with QRS 134 ms and QTC 428 ms- no  acute changes.  VITALS Reviewed today   Temp Readings from Last 3 Encounters:  05/28/19 98.7 F (37.1 C)  05/26/19 (!) 96.4 F (35.8 C) (Tympanic)  05/22/19 97.8 F (36.6 C)   BP Readings from Last 3 Encounters:  05/31/19 (!) 120/48  05/28/19 (!) 118/50  05/26/19 115/69   Pulse Readings from Last 3 Encounters:  05/31/19 (!) 57  05/28/19 (!) 59  05/26/19 68    Wt Readings from Last 3 Encounters:  05/31/19 133 lb 2 oz (60.4 kg)  05/28/19 132 lb 6 oz (60 kg)  05/21/19 128 lb 4.9 oz (58.2 kg)     LABS  reviewed today   CareEverwhere Labs present? Yes/No: No  Lab Results  Component Value Date   WBC 4.4 05/28/2019   HGB 8.7 Repeated and verified X2. (L) 05/28/2019   HCT 27.2 (L) 05/28/2019   MCV 73.9 (L) 05/28/2019   PLT 175.0 05/28/2019   Lab Results  Component Value Date   CREATININE 0.81 05/21/2019   BUN 6 (L) 05/21/2019   NA 136 05/21/2019   K 4.7 05/21/2019   CL 105 05/21/2019   CO2 23 05/21/2019   Lab Results  Component Value Date   ALT 15 05/21/2019   AST 26 05/21/2019   ALKPHOS 73 05/21/2019   BILITOT 0.8 05/21/2019   Lab Results  Component Value Date   CHOL 200 04/14/2019   HDL 55.50 04/14/2019   LDLCALC 121 (H) 04/14/2019   LDLDIRECT 116.6 01/27/2013   TRIG 117.0 04/14/2019   CHOLHDL 4 04/14/2019    Lab Results  Component Value Date   HGBA1C 6.2 04/14/2019   Lab Results  Component Value Date   TSH 2.25 04/14/2019     STUDIES/PROCEDURES reviewed today   Echo 05/21/2019 1. Left ventricular ejection  fraction, by estimation, is 40 to 45%. The  left ventricle has mild to moderately decreased function. The left  ventricle demonstrates global hypokinesis. There is mild left ventricular  hypertrophy. Left ventricular diastolic  parameters are consistent with Grade I diastolic dysfunction (impaired  relaxation).  2. Right ventricular systolic function is normal. The right ventricular  size is normal. There is mildly elevated pulmonary artery systolic  pressure.  3. Left atrial size was mildly dilated.  4. The mitral valve is grossly normal. Trivial mitral valve  regurgitation.  5. The aortic valve is grossly normal. Aortic valve regurgitation is  trivial.  6. The inferior vena cava is normal in size with greater than 50%  respiratory variability, suggesting right atrial pressure of 3 mmHg.   LHC 05/21/19  Ost 1st Mrg lesion is 100% stenosed.  Prox RCA lesion is 30% stenosed.  Mid RCA-1 lesion is 80% stenosed.  Mid RCA-2 lesion is 100% stenosed.  LV end diastolic pressure is moderately elevated.  The left ventricular ejection fraction is 35-45% by visual estimate.  There is moderate left ventricular systolic dysfunction. 1.  Significant two-vessel coronary artery disease with heavily calcified arteries.  The right coronary artery which was severely diseased before is now completely occluded with good left-to-right collaterals.  In addition, OM1 is now occluded and previously had 90% stenosis.  This also has collaterals.  No other obstructive disease. 2.  Moderately reduced LV systolic function.  Moderately elevated left ventricular end-diastolic pressure at 22 mmHg. Recommendations: No revascularization is advised as the patient has collaterals. It is still possible that her myocardial infarction was due to supply demand ischemia in the setting of uncontrolled hypertension. In addition, she now has  cardiomyopathy with moderately reduced EF.  Due to that and in order to better  control her blood pressure, I switch metoprolol to carvedilol and added small dose losartan.  These medications should be uptitrated for blood pressure control.  I ordered an echocardiogram to better evaluate her EF. The patient can likely be discharged home tomorrow.  Assessment & Plan    Non-ST elevation myocardial infarction  CAD s/p LHC 05/21/2019 --No recurrent chest pain.  She has not needed her sublingual nitro.  Reviewed cath results and discussed in detail with patient and son that cardiac cath that showed significant 2v CAD with heavily calcified arteries.  As above, the RCA was occluded with left to right collaterals.  No revascularization performed. She had moderately reduced LVSF and moderately elevated LVEDP.  It was suspected that her recent elevated troponin was 2/2 supply demand ischemia in the setting of uncontrolled blood pressure. Recommend repeat echo in 3 to 6 months to reassess function.  Previous echo as above with EF mildly reduced. --Continue ASA and Plavix.  Recent CBC as above with low hemoglobin and known anemia with patient reporting recent iron infusions. No reported s/sx of bleeding. Given the patient's complaint of fatigue and leg weakness on DAPT, however, repeat CBC today. Per EMR, she does have documented leg weakness dating back to before her LHC. Continue risk factor modification with statin and repeat labs as below in 6-8 weeks.  Continue current dose BB and ARB and escalate at follow-up if tolerated. Continue Imdur and PRN SL nitro.  HFrEF (EF 40-45%) Mild bilateral LEE Venous insufficinecy --Mild bilateral lower extremity edema but otherwise euvolemic on exam.  Recent echo with mildly reduced EF.  Weight stable today.  No shortness of breath or symptoms of worsening heart failure. Considered is her history of venous insufficiency.  Recommend compression stockings and leg elevation.  Discussed daily home weights and blood pressure monitoring.  She is agreeable.   Reassess at RTC as not currently on a standing diuretic.  Essential Hypertension --BP today well controlled. Defer escalation of metoprolol and losartan at this time, given her blood pressure of 128/48 and HR 57 today. Continue Coreg and Losartan. Recommend future escalation of antihypertensives as tolerated by BP and renal function. Goal BP <=130/80.  HLD --Recently started on atorvastatin (switched during admission from simvastatin given LDL of 121).  Recommend follow-up lipid and liver function in 6 to 8 weeks.  If LDL still not well controlled at that time, consider escalation of atorvastatin +/- Zetia.   History of syncope / orthostatic hypotension --Denies any recent syncope. Hydration encouraged today, given her report of low intake of water; however, also discussed keeping fluids under 2L daily and low salt diet.  Chronic Anemia --Receiving iron infusions. Will recheck a CBC given fatigue and weakness on DAPT. No reported s/sx of bleeding today.  Medication changes: None. Reassess escalation of BB/ARB +/- standing diuretic at RTC. Labs ordered: CBC with further recommendations if needed at that time. Lipids and LFTs in 6-8 weeks with escalation of statin +/- addition of Zetia at that time if needed. Studies / Imaging ordered: None. Repeat echo in 3-6 months. Future considerations:  Reassess LEE at follow-up s/p compression stockings and leg elevation.  Enroll in cardiac rehab today. She will bring her BP log to her next visit. Disposition: RTC 3-4 weeks  Total time spent with patient today 30 minutes. This includes reviewing records, evaluating the patient, and coordinating care. Face-to-face time >50%.   Malachi Bonds D  Mickle Plumb, PA-C 05/31/2019

## 2019-05-25 NOTE — Patient Outreach (Signed)
RED EMMI:  New referral for red emmi: Reason for alert: unfilled RX  Placed call to patient and reviewed reason for call. Patient reports she got her prescriptions yesterday afternoon. Denies any other concerns or problems today.  PLAN: close case as no needs identified.  Tomasa Rand, RN, BSN, CEN [facility] [employer] [phone]

## 2019-05-26 ENCOUNTER — Inpatient Hospital Stay: Payer: Medicare Other

## 2019-05-26 VITALS — BP 115/69 | HR 68 | Temp 96.4°F | Resp 20

## 2019-05-26 DIAGNOSIS — E785 Hyperlipidemia, unspecified: Secondary | ICD-10-CM | POA: Diagnosis not present

## 2019-05-26 DIAGNOSIS — I10 Essential (primary) hypertension: Secondary | ICD-10-CM | POA: Diagnosis not present

## 2019-05-26 DIAGNOSIS — K219 Gastro-esophageal reflux disease without esophagitis: Secondary | ICD-10-CM | POA: Diagnosis not present

## 2019-05-26 DIAGNOSIS — D649 Anemia, unspecified: Secondary | ICD-10-CM | POA: Diagnosis not present

## 2019-05-26 DIAGNOSIS — Z87891 Personal history of nicotine dependence: Secondary | ICD-10-CM | POA: Diagnosis not present

## 2019-05-26 DIAGNOSIS — Z8249 Family history of ischemic heart disease and other diseases of the circulatory system: Secondary | ICD-10-CM | POA: Diagnosis not present

## 2019-05-26 DIAGNOSIS — Z833 Family history of diabetes mellitus: Secondary | ICD-10-CM | POA: Diagnosis not present

## 2019-05-26 DIAGNOSIS — D509 Iron deficiency anemia, unspecified: Secondary | ICD-10-CM

## 2019-05-26 DIAGNOSIS — M8580 Other specified disorders of bone density and structure, unspecified site: Secondary | ICD-10-CM | POA: Diagnosis not present

## 2019-05-26 DIAGNOSIS — Z7982 Long term (current) use of aspirin: Secondary | ICD-10-CM | POA: Diagnosis not present

## 2019-05-26 DIAGNOSIS — Z86000 Personal history of in-situ neoplasm of breast: Secondary | ICD-10-CM | POA: Diagnosis not present

## 2019-05-26 DIAGNOSIS — Z79899 Other long term (current) drug therapy: Secondary | ICD-10-CM | POA: Diagnosis not present

## 2019-05-26 MED ORDER — SODIUM CHLORIDE 0.9 % IV SOLN
510.0000 mg | Freq: Once | INTRAVENOUS | Status: AC
  Administered 2019-05-26: 510 mg via INTRAVENOUS
  Filled 2019-05-26: qty 510

## 2019-05-26 MED ORDER — SODIUM CHLORIDE 0.9 % IV SOLN
Freq: Once | INTRAVENOUS | Status: AC
  Filled 2019-05-26: qty 250

## 2019-05-28 ENCOUNTER — Encounter: Payer: Self-pay | Admitting: Family Medicine

## 2019-05-28 ENCOUNTER — Ambulatory Visit (INDEPENDENT_AMBULATORY_CARE_PROVIDER_SITE_OTHER): Payer: Medicare Other | Admitting: Family Medicine

## 2019-05-28 ENCOUNTER — Other Ambulatory Visit: Payer: Self-pay

## 2019-05-28 VITALS — BP 118/50 | HR 59 | Temp 98.7°F | Resp 20 | Ht 60.0 in | Wt 132.4 lb

## 2019-05-28 DIAGNOSIS — I214 Non-ST elevation (NSTEMI) myocardial infarction: Secondary | ICD-10-CM

## 2019-05-28 DIAGNOSIS — D509 Iron deficiency anemia, unspecified: Secondary | ICD-10-CM | POA: Diagnosis not present

## 2019-05-28 DIAGNOSIS — I10 Essential (primary) hypertension: Secondary | ICD-10-CM | POA: Diagnosis not present

## 2019-05-28 DIAGNOSIS — I25118 Atherosclerotic heart disease of native coronary artery with other forms of angina pectoris: Secondary | ICD-10-CM

## 2019-05-28 LAB — CBC WITH DIFFERENTIAL/PLATELET
Basophils Absolute: 0 10*3/uL (ref 0.0–0.1)
Basophils Relative: 0.7 % (ref 0.0–3.0)
Eosinophils Absolute: 0.1 10*3/uL (ref 0.0–0.7)
Eosinophils Relative: 2.2 % (ref 0.0–5.0)
HCT: 27.2 % — ABNORMAL LOW (ref 36.0–46.0)
Hemoglobin: 8.7 g/dL — ABNORMAL LOW (ref 12.0–15.0)
Lymphocytes Relative: 33.6 % (ref 12.0–46.0)
Lymphs Abs: 1.5 10*3/uL (ref 0.7–4.0)
MCHC: 31.9 g/dL (ref 30.0–36.0)
MCV: 73.9 fl — ABNORMAL LOW (ref 78.0–100.0)
Monocytes Absolute: 0.4 10*3/uL (ref 0.1–1.0)
Monocytes Relative: 9.5 % (ref 3.0–12.0)
Neutro Abs: 2.4 10*3/uL (ref 1.4–7.7)
Neutrophils Relative %: 54 % (ref 43.0–77.0)
Platelets: 175 10*3/uL (ref 150.0–400.0)
RBC: 3.68 Mil/uL — ABNORMAL LOW (ref 3.87–5.11)
RDW: 18.2 % — ABNORMAL HIGH (ref 11.5–15.5)
WBC: 4.4 10*3/uL (ref 4.0–10.5)

## 2019-05-28 MED ORDER — TERCONAZOLE 0.4 % VA CREA: TOPICAL_CREAM | VAGINAL | 0 refills | Status: DC

## 2019-05-28 NOTE — Assessment & Plan Note (Signed)
Recent hosp for NSTEMI Doing well now on dual anti platelet tx and statin  Reviewed hospital records, lab results and studies in detail   Reassuring exam  For f/u with cardiology monday

## 2019-05-28 NOTE — Assessment & Plan Note (Signed)
Recent hosp for NSTEMI last hb was 8.4  Has had 2 iron inf since then /followed by heme  Starting to feel better  Cbc today

## 2019-05-28 NOTE — Patient Instructions (Addendum)
We will check your blood count today since you have had iron   Vital signs are re assuring  See cardiology as planned   Continue current medicines   Try the terazol cream for vaginal itching   For cholesterol: Avoid red meat/ fried foods/ egg yolks/ fatty breakfast meats/ butter, cheese and high fat dairy/ and shellfish

## 2019-05-28 NOTE — Progress Notes (Signed)
Subjective:    Patient ID: Jenna Young, female    DOB: [birth year], 86 y.o.   MRN: [id number]  This visit occurred during the SARS-CoV-2 public health emergency.  Safety protocols were in place, including screening questions prior to the visit, additional usage of staff PPE, and extensive cleaning of exam room while observing appropriate contact time as indicated for disinfecting solutions.    HPI  Pt presents for f/u of hospitalization from 3/12 to 05/22/19 for NSTEMI  She presented to the ER with severe chest pain  Troponin was elevated and she proceeded to cardiac cath showing CTO of RCA and OM1 with collaterals  No intervention was done  Med management- dual anti platelets and losartan  (continues asa and statin) and told to f/u with cardiology as out pt   In the setting of h/o iron def anemia - Hb dec to 8.4 after cath  No bleeding was noted  She is treated by hematologist with IV iron and planned f/u for that   Lab Results  Component Value Date   CREATININE 0.81 05/21/2019   BUN 6 (L) 05/21/2019   NA 136 05/21/2019   K 4.7 05/21/2019   CL 105 05/21/2019   CO2 23 05/21/2019   Lab Results  Component Value Date   ALT 15 05/21/2019   AST 26 05/21/2019   ALKPHOS 73 05/21/2019   BILITOT 0.8 05/21/2019   Lab Results  Component Value Date   WBC 5.1 05/22/2019   HGB 8.4 (L) 05/22/2019   HCT 28.1 (L) 05/22/2019   MCV 75.1 (L) 05/22/2019   PLT 198 05/22/2019   Lab Results  Component Value Date   FERRITIN 6 (L) 05/12/2019   covid 19 tests and flu test are negative   Echocardiogram done 3/12 was re assuring with the following findings  FINDINGS  Left Ventricle: Left ventricular ejection fraction, by estimation, is 40  to 45%. The left ventricle has mild to moderately decreased function. The  left ventricle demonstrates global hypokinesis. The left ventricular  internal cavity size was normal in  size. There is mild left ventricular hypertrophy. Left  ventricular  diastolic parameters are consistent with Grade I diastolic dysfunction  (impaired relaxation).   Right Ventricle: The right ventricular size is normal. Right vetricular  wall thickness was not assessed. Right ventricular systolic function is  normal. There is mildly elevated pulmonary artery systolic pressure. The  tricuspid regurgitant velocity is  2.71 m/s, and with an assumed right atrial pressure of 3 mmHg, the  estimated right ventricular systolic pressure is [id number] mmHg.   Left Atrium: Left atrial size was mildly dilated.   Right Atrium: Right atrial size was normal in size.   Pericardium: There is no evidence of pericardial effusion.   Mitral Valve: The mitral valve is grossly normal. Mild mitral annular  calcification. Trivial mitral valve regurgitation.   Tricuspid Valve: The tricuspid valve is normal in structure. Tricuspid  valve regurgitation is not demonstrated.   Aortic Valve: The aortic valve is grossly normal. Aortic valve  regurgitation is trivial. Aortic regurgitation PHT measures 621 msec.   Pulmonic Valve: The pulmonic valve was normal in structure. Pulmonic valve  regurgitation is not visualized.   Aorta: The aortic root is normal in size and structure.   Venous: The inferior vena cava is normal in size with greater than 50%  respiratory variability, suggesting right atrial pressure of 3 mmHg.   IAS/Shunts: No atrial level shunt detected by color flow Doppler.  CTA also reassuring with interp: IMPRESSION: 1. No CT evidence for thoracic aortic dissection or aneurysm. 2. No acute intrathoracic disease. 3. Stable appearing compression fractures of the T12 and L1 vertebral bodies.  Aortic Atherosclerosis (ICD10-I70.0).    Cardiology appt is 3/22   Wt Readings from Last 3 Encounters:  05/28/19 132 lb 6 oz (60 kg)  05/21/19 128 lb 4.9 oz (58.2 kg)  05/12/19 133 lb 3.2 oz (60.4 kg)  wt is stable  Appetite is fair - she does not have a  lot of appetite  25.85 kg/m   BP Readings from Last 3 Encounters:  05/28/19 (!) 118/50  05/26/19 115/69  05/22/19 (!) 113/52   Pulse Readings from Last 3 Encounters:  05/28/19 (!) 59  05/26/19 68  05/22/19 74   Been resting at home Has been out every day  Daughter is with her   She has had her covid vaccines   No chest pain since coming home  Has not needed nitro  No ankle swelling   R arm is still sore after the cath   Some vaginal itch --? Yeast infection  Has not tried anything otc for it    Review of Systems  Constitutional: Negative for activity change, appetite change, fatigue, fever and unexpected weight change.  HENT: Negative for congestion, ear pain, rhinorrhea, sinus pressure and sore throat.   Eyes: Negative for pain, redness and visual disturbance.  Respiratory: Negative for cough, shortness of breath and wheezing.   Cardiovascular: Negative for chest pain, palpitations and leg swelling.  Gastrointestinal: Negative for abdominal pain, blood in stool, constipation and diarrhea.  Endocrine: Negative for polydipsia and polyuria.  Genitourinary: Negative for dysuria, frequency and urgency.       Vaginal itching on the outside Some urinary incontinence Wears pads  Musculoskeletal: Negative for arthralgias, back pain and myalgias.  Skin: Negative for pallor and rash.  Allergic/Immunologic: Negative for environmental allergies.  Neurological: Negative for dizziness, syncope and headaches.  Hematological: Negative for adenopathy. Does not bruise/bleed easily.  Psychiatric/Behavioral: Negative for decreased concentration and dysphoric mood. The patient is not nervous/anxious.        Objective:   Physical Exam Constitutional:      General: She is not in acute distress.    Appearance: She is well-developed and normal weight. She is not ill-appearing or diaphoretic.  HENT:     Head: Normocephalic and atraumatic.     Mouth/Throat:     Mouth: Mucous  membranes are moist.  Eyes:     General: No scleral icterus.    Conjunctiva/sclera: Conjunctivae normal.     Pupils: Pupils are equal, round, and reactive to light.  Neck:     Thyroid: No thyromegaly.     Vascular: No carotid bruit or JVD.  Cardiovascular:     Rate and Rhythm: Normal rate and regular rhythm.     Heart sounds: Normal heart sounds. No gallop.   Pulmonary:     Effort: Pulmonary effort is normal. No respiratory distress.     Breath sounds: Normal breath sounds. No wheezing or rales.  Abdominal:     General: Bowel sounds are normal. There is no distension or abdominal bruit.     Palpations: Abdomen is soft. There is no mass.     Tenderness: There is no abdominal tenderness.     Hernia: No hernia is present.  Musculoskeletal:     Cervical back: Normal range of motion and neck supple.     Right lower leg: No  edema.     Left lower leg: No edema.     Comments: Some varicose veins on ankles  Lymphadenopathy:     Cervical: No cervical adenopathy.  Skin:    General: Skin is warm and dry.     Findings: No rash.  Neurological:     Mental Status: She is alert. Mental status is at baseline.     Deep Tendon Reflexes: Reflexes are normal and symmetric.     Comments: No focal weakness  Psychiatric:        Mood and Affect: Mood normal.     Comments: pleasant           Assessment & Plan:   Problem List Items Addressed This Visit      Cardiovascular and Mediastinum   Essential hypertension, benign    Controlled in setting of recent NSTEMI  bp in fair control at this time  BP Readings from Last 1 Encounters:  05/28/19 (!) 118/50   No changes needed Most recent labs reviewed  Disc lifstyle change with low sodium diet and exercise  Watching for low bp with the recent losartan addn      Non-ST elevation (NSTEMI) myocardial infarction [facility]) - Primary    Recent hosp/cath and med management Reviewed hospital records, lab results and studies in detail   Doing well  overall -no further CP now  Has cardiology f/u Monday  Continues current meds incl dual anti platelet and statin  bp is well controlled  Echo showed DD        Coronary artery disease of native artery of native heart with stable angina pectoris ([place])    Recent hosp for NSTEMI Doing well now on dual anti platelet tx and statin  Reviewed hospital records, lab results and studies in detail   Reassuring exam  For f/u with cardiology monday        Other   Iron deficiency anemia    Recent hosp for NSTEMI last hb was 8.4  Has had 2 iron inf since then /followed by heme  Starting to feel better  Cbc today      Relevant Orders   CBC with Differential/Platelet

## 2019-05-28 NOTE — Assessment & Plan Note (Signed)
Controlled in setting of recent NSTEMI  bp in fair control at this time  BP Readings from Last 1 Encounters:  05/28/19 (!) 118/50   No changes needed Most recent labs reviewed  Disc lifstyle change with low sodium diet and exercise  Watching for low bp with the recent losartan addn

## 2019-05-28 NOTE — Assessment & Plan Note (Signed)
Recent hosp/cath and med management Reviewed hospital records, lab results and studies in detail   Doing well overall -no further CP now  Has cardiology f/u Monday  Continues current meds incl dual anti platelet and statin  bp is well controlled  Echo showed DD

## 2019-05-31 ENCOUNTER — Encounter: Payer: Self-pay | Admitting: Physician Assistant

## 2019-05-31 ENCOUNTER — Other Ambulatory Visit: Payer: Self-pay

## 2019-05-31 ENCOUNTER — Ambulatory Visit (INDEPENDENT_AMBULATORY_CARE_PROVIDER_SITE_OTHER): Payer: Medicare Other | Admitting: Physician Assistant

## 2019-05-31 VITALS — BP 120/48 | HR 57 | Ht 60.0 in | Wt 133.1 lb

## 2019-05-31 DIAGNOSIS — Z8679 Personal history of other diseases of the circulatory system: Secondary | ICD-10-CM | POA: Diagnosis not present

## 2019-05-31 DIAGNOSIS — R5383 Other fatigue: Secondary | ICD-10-CM

## 2019-05-31 DIAGNOSIS — Z87898 Personal history of other specified conditions: Secondary | ICD-10-CM

## 2019-05-31 DIAGNOSIS — I872 Venous insufficiency (chronic) (peripheral): Secondary | ICD-10-CM | POA: Diagnosis not present

## 2019-05-31 DIAGNOSIS — E785 Hyperlipidemia, unspecified: Secondary | ICD-10-CM

## 2019-05-31 DIAGNOSIS — I214 Non-ST elevation (NSTEMI) myocardial infarction: Secondary | ICD-10-CM | POA: Diagnosis not present

## 2019-05-31 DIAGNOSIS — I2510 Atherosclerotic heart disease of native coronary artery without angina pectoris: Secondary | ICD-10-CM | POA: Diagnosis not present

## 2019-05-31 DIAGNOSIS — I252 Old myocardial infarction: Secondary | ICD-10-CM | POA: Diagnosis not present

## 2019-05-31 DIAGNOSIS — I5022 Chronic systolic (congestive) heart failure: Secondary | ICD-10-CM | POA: Diagnosis not present

## 2019-05-31 DIAGNOSIS — I10 Essential (primary) hypertension: Secondary | ICD-10-CM | POA: Diagnosis not present

## 2019-05-31 DIAGNOSIS — D649 Anemia, unspecified: Secondary | ICD-10-CM | POA: Diagnosis not present

## 2019-05-31 NOTE — Patient Instructions (Signed)
Medication Instructions:  - Your physician recommends that you continue on your current medications as directed. Please refer to the Current Medication list given to you today.  *If you need a refill on your cardiac medications before your next appointment, please call your pharmacy*   Lab Work: - Your physician recommends that you have lab work today: CBC  If you have labs (blood work) drawn today and your tests are completely normal, you will receive your results only by: Marland Kitchen MyChart Message (if you have MyChart) OR . A paper copy in the mail If you have any lab test that is abnormal or we need to change your treatment, we will call you to review the results.   Testing/Procedures: - none ordered   Follow-Up: At [facility], you and your health needs are our priority.  As part of our continuing mission to provide you with exceptional heart care, we have created designated Provider Care Teams.  These Care Teams include your primary Cardiologist (physician) and Advanced Practice Providers (APPs -  Physician Assistants and Nurse Practitioners) who all work together to provide you with the care you need, when you need it.  We recommend signing up for the patient portal called "MyChart".  Sign up information is provided on this After Visit Summary.  MyChart is used to connect with patients for Virtual Visits (Telemedicine).  Patients are able to view lab/test results, encounter notes, upcoming appointments, etc.  Non-urgent messages can be sent to your provider as well.   To learn more about what you can do with MyChart, go to [email or web address].    Your next appointment:   1 month(s)  The format for your next appointment:   In Person  Provider:    You may see Ida Rogue, MD or one of the following Advanced Practice Providers on your designated Care Team:    Murray Hodgkins, NP  Christell Faith, PA-C  Marrianne Mood, PA-C    Other Instructions - You have been referred  to : Cardiac Rehab at [facility]- they will process your referral and call you to schedule an orientation. If you do not hear from the Cardiac Rehab Department in 2 weeks, please give our office a call and we will follow up on this for you.

## 2019-06-01 LAB — CBC WITH DIFFERENTIAL/PLATELET
Basophils Absolute: 0.1 10*3/uL (ref 0.0–0.2)
Basos: 1 %
EOS (ABSOLUTE): 0.1 10*3/uL (ref 0.0–0.4)
Eos: 2 %
Hematocrit: 30.1 % — ABNORMAL LOW (ref 34.0–46.6)
Hemoglobin: 9.1 g/dL — ABNORMAL LOW (ref 11.1–15.9)
Immature Grans (Abs): 0 10*3/uL (ref 0.0–0.1)
Immature Granulocytes: 0 %
Lymphocytes Absolute: 2 10*3/uL (ref 0.7–3.1)
Lymphs: 36 %
MCH: 23.8 pg — ABNORMAL LOW (ref 26.6–33.0)
MCHC: 30.2 g/dL — ABNORMAL LOW (ref 31.5–35.7)
MCV: 79 fL (ref 79–97)
Monocytes Absolute: 0.5 10*3/uL (ref 0.1–0.9)
Monocytes: 9 %
Neutrophils Absolute: 2.9 10*3/uL (ref 1.4–7.0)
Neutrophils: 52 %
Platelets: 195 10*3/uL (ref 150–450)
RBC: 3.83 x10E6/uL (ref 3.77–5.28)
RDW: 18.8 % — ABNORMAL HIGH (ref 11.7–15.4)
WBC: 5.5 10*3/uL (ref 3.4–10.8)

## 2019-06-02 ENCOUNTER — Other Ambulatory Visit: Payer: Self-pay | Admitting: *Deleted

## 2019-06-02 DIAGNOSIS — I214 Non-ST elevation (NSTEMI) myocardial infarction: Secondary | ICD-10-CM

## 2019-06-17 ENCOUNTER — Encounter: Payer: Medicare Other | Attending: Cardiovascular Disease | Admitting: *Deleted

## 2019-06-17 ENCOUNTER — Other Ambulatory Visit: Payer: Self-pay

## 2019-06-17 DIAGNOSIS — I252 Old myocardial infarction: Secondary | ICD-10-CM | POA: Insufficient documentation

## 2019-06-17 DIAGNOSIS — I214 Non-ST elevation (NSTEMI) myocardial infarction: Secondary | ICD-10-CM

## 2019-06-17 NOTE — Progress Notes (Signed)
Initial telephone encounter completed. Diagnosis can be found in [facility] 3/12. EP orientation scheduled for 4/12 at 11am

## 2019-06-21 ENCOUNTER — Encounter: Payer: Medicare Other | Admitting: *Deleted

## 2019-06-21 ENCOUNTER — Other Ambulatory Visit: Payer: Self-pay

## 2019-06-21 VITALS — Ht 62.0 in | Wt 133.7 lb

## 2019-06-21 DIAGNOSIS — I252 Old myocardial infarction: Secondary | ICD-10-CM | POA: Diagnosis not present

## 2019-06-21 DIAGNOSIS — I214 Non-ST elevation (NSTEMI) myocardial infarction: Secondary | ICD-10-CM

## 2019-06-21 NOTE — Patient Instructions (Signed)
Patient Instructions  Patient Details  Name: Jenna Young MRN: [id number] Date of Birth: 03/26/1931 Referring Provider:  Minna Merritts, MD  Below are your personal goals for exercise, nutrition, and risk factors. Our goal is to help you stay on track towards obtaining and maintaining these goals. We will be discussing your progress on these goals with you throughout the program.  Initial Exercise Prescription: Initial Exercise Prescription - 06/21/19 1400      Date of Initial Exercise RX and Referring Provider   Date  06/21/19    Referring Provider  Ida Rogue MD      Treadmill   MPH  1.3    Grade  0    Minutes  15    METs  2      NuStep   Level  1    SPM  80    Minutes  15    METs  2      Biostep-RELP   Level  1    SPM  50    Minutes  15    METs  2      Prescription Details   Frequency (times per week)  3    Duration  Progress to 30 minutes of continuous aerobic without signs/symptoms of physical distress      Intensity   THRR 40-80% of Max Heartrate  88-119    Ratings of Perceived Exertion  11-13    Perceived Dyspnea  0-4      Progression   Progression  Continue to progress workloads to maintain intensity without signs/symptoms of physical distress.      Resistance Training   Training Prescription  Yes    Weight  3 lb    Reps  10-15       Exercise Goals: Frequency: Be able to perform aerobic exercise two to three times per week in program working toward 2-5 days per week of home exercise.  Intensity: Work with a perceived exertion of 11 (fairly light) - 15 (hard) while following your exercise prescription.  We will make changes to your prescription with you as you progress through the program.   Duration: Be able to do 30 to 45 minutes of continuous aerobic exercise in addition to a 5 minute warm-up and a 5 minute cool-down routine.   Nutrition Goals: Your personal nutrition goals will be established when you do your nutrition analysis  with the dietician.  The following are general nutrition guidelines to follow: Cholesterol < 200mg /day Sodium < 1500mg /day Fiber: Women over 50 yrs - 21 grams per day  Personal Goals: Personal Goals and Risk Factors at Admission - 06/21/19 1449      Core Components/Risk Factors/Patient Goals on Admission    Weight Management  Yes;Weight Maintenance    Intervention  Weight Management: Develop a combined nutrition and exercise program designed to reach desired caloric intake, while maintaining appropriate intake of nutrient and fiber, sodium and fats, and appropriate energy expenditure required for the weight goal.;Weight Management: Provide education and appropriate resources to help participant work on and attain dietary goals.    Admit Weight  133 lb 11.2 oz (60.6 kg)    Goal Weight: Short Term  133 lb (60.3 kg)    Goal Weight: Long Term  133 lb (60.3 kg)    Expected Outcomes  Short Term: Continue to assess and modify interventions until short term weight is achieved;Long Term: Adherence to nutrition and physical activity/exercise program aimed toward attainment of established weight goal;Weight Maintenance: Understanding  of the daily nutrition guidelines, which includes 25-35% calories from fat, 7% or less cal from saturated fats, less than 200mg  cholesterol, less than 1.5gm of sodium, & 5 or more servings of fruits and vegetables daily    Hypertension  Yes    Intervention  Provide education on lifestyle modifcations including regular physical activity/exercise, weight management, moderate sodium restriction and increased consumption of fresh fruit, vegetables, and low fat dairy, alcohol moderation, and smoking cessation.;Monitor prescription use compliance.    Expected Outcomes  Short Term: Continued assessment and intervention until BP is < 140/55mm HG in hypertensive participants. < 130/18mm HG in hypertensive participants with diabetes, heart failure or chronic kidney disease.;Long Term:  Maintenance of blood pressure at goal levels.    Lipids  Yes    Intervention  Provide education and support for participant on nutrition & aerobic/resistive exercise along with prescribed medications to achieve LDL 70mg , HDL >40mg .    Expected Outcomes  Short Term: Participant states understanding of desired cholesterol values and is compliant with medications prescribed. Participant is following exercise prescription and nutrition guidelines.;Long Term: Cholesterol controlled with medications as prescribed, with individualized exercise RX and with personalized nutrition plan. Value goals: LDL < 70mg , HDL > 40 mg.       Tobacco Use Initial Evaluation: Social History   Tobacco Use  Smoking Status Former Smoker  . Quit date: 03/11/1968  . Years since quitting: 51.3  Smokeless Tobacco Never Used    Exercise Goals and Review: Exercise Goals    Row Name 06/21/19 1447             Exercise Goals   Increase Physical Activity  Yes       Intervention  Provide advice, education, support and counseling about physical activity/exercise needs.;Develop an individualized exercise prescription for aerobic and resistive training based on initial evaluation findings, risk stratification, comorbidities and participant's personal goals.       Expected Outcomes  Short Term: Attend rehab on a regular basis to increase amount of physical activity.;Long Term: Exercising regularly at least 3-5 days a week.;Long Term: Add in home exercise to make exercise part of routine and to increase amount of physical activity.       Increase Strength and Stamina  Yes       Intervention  Provide advice, education, support and counseling about physical activity/exercise needs.;Develop an individualized exercise prescription for aerobic and resistive training based on initial evaluation findings, risk stratification, comorbidities and participant's personal goals.       Expected Outcomes  Short Term: Increase workloads from  initial exercise prescription for resistance, speed, and METs.;Short Term: Perform resistance training exercises routinely during rehab and add in resistance training at home;Long Term: Improve cardiorespiratory fitness, muscular endurance and strength as measured by increased METs and functional capacity (6MWT)       Able to understand and use rate of perceived exertion (RPE) scale  Yes       Intervention  Provide education and explanation on how to use RPE scale       Expected Outcomes  Short Term: Able to use RPE daily in rehab to express subjective intensity level;Long Term:  Able to use RPE to guide intensity level when exercising independently       Able to understand and use Dyspnea scale  Yes       Intervention  Provide education and explanation on how to use Dyspnea scale       Expected Outcomes  Short Term: Able to use  Dyspnea scale daily in rehab to express subjective sense of shortness of breath during exertion;Long Term: Able to use Dyspnea scale to guide intensity level when exercising independently       Knowledge and understanding of Target Heart Rate Range (THRR)  Yes       Intervention  Provide education and explanation of THRR including how the numbers were predicted and where they are located for reference       Expected Outcomes  Short Term: Able to state/look up THRR;Long Term: Able to use THRR to govern intensity when exercising independently;Short Term: Able to use daily as guideline for intensity in rehab       Able to check pulse independently  Yes       Intervention  Provide education and demonstration on how to check pulse in carotid and radial arteries.;Review the importance of being able to check your own pulse for safety during independent exercise       Expected Outcomes  Short Term: Able to explain why pulse checking is important during independent exercise;Long Term: Able to check pulse independently and accurately       Understanding of Exercise Prescription  Yes        Intervention  Provide education, explanation, and written materials on patient's individual exercise prescription       Expected Outcomes  Short Term: Able to explain program exercise prescription;Long Term: Able to explain home exercise prescription to exercise independently          Copy of goals given to participant.

## 2019-06-21 NOTE — Progress Notes (Signed)
Cardiac Individual Treatment Plan  Patient Details  Name: Jenna Young MRN: [id number] Date of Birth: 1931/10/28 Referring Provider:     Cardiac Rehab from 06/21/2019 in [facility] Cardiac and Pulmonary Rehab  Referring Provider  Ida Rogue MD      Initial Encounter Date:    Cardiac Rehab from 06/21/2019 in [facility] Cardiac and Pulmonary Rehab  Date  06/21/19      Visit Diagnosis: NSTEMI (non-ST elevated myocardial infarction) [facility])  Patient's Home Medications on Admission:  Current Outpatient Medications:  .  aspirin (ASPIRIN EC) 81 MG EC tablet, Take 81 mg by mouth daily. Swallow whole. , Disp: , Rfl:  .  atorvastatin (LIPITOR) 40 MG tablet, Take 1 tablet (40 mg total) by mouth daily at 6 PM., Disp: 90 tablet, Rfl: 0 .  carvedilol (COREG) 6.25 MG tablet, Take 1 tablet (6.25 mg total) by mouth 2 (two) times daily with a meal., Disp: 60 tablet, Rfl: 0 .  Cholecalciferol (VITAMIN D) 2000 UNITS CAPS, Take 1 capsule by mouth daily.  , Disp: , Rfl:  .  clopidogrel (PLAVIX) 75 MG tablet, Take 1 tablet (75 mg total) by mouth daily with breakfast., Disp: 90 tablet, Rfl: 3 .  cyanocobalamin (,VITAMIN B-12,) 1000 MCG/ML injection, Inject 1,000 mcg into the muscle every 3 (three) months., Disp: , Rfl:  .  isosorbide mononitrate (IMDUR) 30 MG 24 hr tablet, TAKE ONE-HALF TABLET BY MOUTH EVERY DAY, Disp: 15 tablet, Rfl: 3 .  losartan (COZAAR) 25 MG tablet, Take 1 tablet (25 mg total) by mouth daily., Disp: 30 tablet, Rfl: 0 .  moxifloxacin (VIGAMOX) 0.5 % ophthalmic solution, INSTILL 1 DROP IN BOTH EYES FOUR TIMES DAILY FOR 7 DAYS, Disp: , Rfl:  .  nitroGLYCERIN (NITROSTAT) 0.4 MG SL tablet, PLACE 1 TABLET UNDER TONGUE EVERY 5 MIN AS NEEDED FOR CHEST PAIN IF NO RELIEF IN15 MIN CALL 911 (MAX 3 TABS), Disp: 25 tablet, Rfl: 1 .  omeprazole (PRILOSEC) 20 MG capsule, Take 1 capsule (20 mg total) by mouth daily as needed., Disp: 90 capsule, Rfl: 3 .  terconazole (TERAZOL 7) 0.4 % vaginal cream, Apply to  affected areas of vulva daily for itching as needed, Disp: 45 g, Rfl: 0  Past Medical History: Past Medical History:  Diagnosis Date  . Allergic rhinitis   . Anemia   . Anxiety   . GERD (gastroesophageal reflux disease)   . History of radiation therapy 04/29/16- 05/24/16   Left Breast 40.05 Gy in 15 fractions, Left Breast boost 10 Gy in 5 fractions.   . Hyperlipidemia   . Hypertension    treated by Dr Thurnell Garbe recently with syncope episode  . Osteopenia   . Osteoporosis   . Personal history of radiation therapy 2018    Tobacco Use: Social History   Tobacco Use  Smoking Status Former Smoker  . Quit date: 03/11/1968  . Years since quitting: 51.3  Smokeless Tobacco Never Used    Labs: Recent Review Flowsheet Data    Labs for ITP Cardiac and Pulmonary Rehab Latest Ref Rng & Units 02/11/2017 03/31/2017 10/17/2017 04/08/2018 04/14/2019   Cholestrol 0 - 200 mg/dL 173 125 121 140 200   LDLCALC 0 - 99 mg/dL 100(H) 56 51 66 121(H)   LDLDIRECT mg/dL - - - - -   HDL >39.00 mg/dL 53 50.50 53 54.00 55.50   Trlycerides 0.0 - 149.0 mg/dL 98 92.0 83 101.0 117.0   Hemoglobin A1c 4.6 - 6.5 % - - - - 6.2  TCO2 0 - 100 mmol/L - - - - -       Exercise Target Goals: Exercise Program Goal: Individual exercise prescription set using results from initial 6 min walk test and THRR while considering  patient's activity barriers and safety.   Exercise Prescription Goal: Initial exercise prescription builds to 30-45 minutes a day of aerobic activity, 2-3 days per week.  Home exercise guidelines will be given to patient during program as part of exercise prescription that the participant will acknowledge.   Education: Aerobic Exercise & Resistance Training: - Gives group verbal and written instruction on the various components of exercise. Focuses on aerobic and resistive training programs and the benefits of this training and how to safely progress through these programs..   Cardiac Rehab from  05/15/2017 in [facility] Cardiac and Pulmonary Rehab  Date  03/13/17  Educator  [facility]  Instruction Review Code  1- Verbalizes Understanding      Education: Exercise & Equipment Safety: - Individual verbal instruction and demonstration of equipment use and safety with use of the equipment.   Cardiac Rehab from 06/21/2019 in [facility] Cardiac and Pulmonary Rehab  Date  06/21/19  Educator  [facility]  Instruction Review Code  1- Verbalizes Understanding      Education: Exercise Physiology & General Exercise Guidelines: - Group verbal and written instruction with models to review the exercise physiology of the cardiovascular system and associated critical values. Provides general exercise guidelines with specific guidelines to those with heart or lung disease.    Cardiac Rehab from 05/15/2017 in [facility] Cardiac and Pulmonary Rehab  Date  05/01/17  Educator  [facility]  Instruction Review Code  1- Verbalizes Understanding      Education: Flexibility, Balance, Mind/Body Relaxation: Provides group verbal/written instruction on the benefits of flexibility and balance training, including mind/body exercise modes such as yoga, pilates and tai chi.  Demonstration and skill practice provided.   Cardiac Rehab from 05/15/2017 in [facility] Cardiac and Pulmonary Rehab  Date  05/08/17  Educator  AS  Instruction Review Code  1- Verbalizes Understanding      Activity Barriers & Risk Stratification: Activity Barriers & Cardiac Risk Stratification - 06/21/19 1445      Activity Barriers & Cardiac Risk Stratification   Activity Barriers  Back Problems;Deconditioning;Muscular Weakness;Balance Concerns    Cardiac Risk Stratification  High       6 Minute Walk: 6 Minute Walk    Row Name 06/21/19 1444         6 Minute Walk   Phase  Initial     Distance  730 feet     Walk Time  6 minutes     MPH  1.38     METS  1.17     RPE  12     VO2 Peak  4.11     Symptoms  No     Resting HR  56 bpm     Resting BP  136/64     Resting Oxygen  Saturation   97 %     Exercise Oxygen Saturation  during 6 min walk  97 %     Max Ex. HR  87 bpm     Max Ex. BP  156/74     2 Minute Post BP  144/74        Oxygen Initial Assessment:   Oxygen Re-Evaluation:   Oxygen Discharge (Final Oxygen Re-Evaluation):   Initial Exercise Prescription: Initial Exercise Prescription - 06/21/19 1400      Date  of Initial Exercise RX and Referring Provider   Date  06/21/19    Referring Provider  Ida Rogue MD      Treadmill   MPH  1.3    Grade  0    Minutes  15    METs  2      NuStep   Level  1    SPM  80    Minutes  15    METs  2      Biostep-RELP   Level  1    SPM  50    Minutes  15    METs  2      Prescription Details   Frequency (times per week)  3    Duration  Progress to 30 minutes of continuous aerobic without signs/symptoms of physical distress      Intensity   THRR 40-80% of Max Heartrate  88-119    Ratings of Perceived Exertion  11-13    Perceived Dyspnea  0-4      Progression   Progression  Continue to progress workloads to maintain intensity without signs/symptoms of physical distress.      Resistance Training   Training Prescription  Yes    Weight  3 lb    Reps  10-15       Perform Capillary Blood Glucose checks as needed.  Exercise Prescription Changes: Exercise Prescription Changes    Row Name 06/21/19 1400             Response to Exercise   Blood Pressure (Admit)  136/64       Blood Pressure (Exercise)  156/74       Blood Pressure (Exit)  144/74       Heart Rate (Admit)  86 bpm       Heart Rate (Exercise)  87 bpm       Heart Rate (Exit)  58 bpm       Oxygen Saturation (Admit)  97 %       Oxygen Saturation (Exercise)  97 %       Rating of Perceived Exertion (Exercise)  12       Symptoms  fatigue at end       Comments  walk test results          Exercise Comments:   Exercise Goals and Review: Exercise Goals    Row Name 06/21/19 1447             Exercise Goals   Increase  Physical Activity  Yes       Intervention  Provide advice, education, support and counseling about physical activity/exercise needs.;Develop an individualized exercise prescription for aerobic and resistive training based on initial evaluation findings, risk stratification, comorbidities and participant's personal goals.       Expected Outcomes  Short Term: Attend rehab on a regular basis to increase amount of physical activity.;Long Term: Exercising regularly at least 3-5 days a week.;Long Term: Add in home exercise to make exercise part of routine and to increase amount of physical activity.       Increase Strength and Stamina  Yes       Intervention  Provide advice, education, support and counseling about physical activity/exercise needs.;Develop an individualized exercise prescription for aerobic and resistive training based on initial evaluation findings, risk stratification, comorbidities and participant's personal goals.       Expected Outcomes  Short Term: Increase workloads from initial exercise prescription for resistance, speed, and METs.;Short Term: Perform resistance training exercises  routinely during rehab and add in resistance training at home;Long Term: Improve cardiorespiratory fitness, muscular endurance and strength as measured by increased METs and functional capacity (6MWT)       Able to understand and use rate of perceived exertion (RPE) scale  Yes       Intervention  Provide education and explanation on how to use RPE scale       Expected Outcomes  Short Term: Able to use RPE daily in rehab to express subjective intensity level;Long Term:  Able to use RPE to guide intensity level when exercising independently       Able to understand and use Dyspnea scale  Yes       Intervention  Provide education and explanation on how to use Dyspnea scale       Expected Outcomes  Short Term: Able to use Dyspnea scale daily in rehab to express subjective sense of shortness of breath during  exertion;Long Term: Able to use Dyspnea scale to guide intensity level when exercising independently       Knowledge and understanding of Target Heart Rate Range (THRR)  Yes       Intervention  Provide education and explanation of THRR including how the numbers were predicted and where they are located for reference       Expected Outcomes  Short Term: Able to state/look up THRR;Long Term: Able to use THRR to govern intensity when exercising independently;Short Term: Able to use daily as guideline for intensity in rehab       Able to check pulse independently  Yes       Intervention  Provide education and demonstration on how to check pulse in carotid and radial arteries.;Review the importance of being able to check your own pulse for safety during independent exercise       Expected Outcomes  Short Term: Able to explain why pulse checking is important during independent exercise;Long Term: Able to check pulse independently and accurately       Understanding of Exercise Prescription  Yes       Intervention  Provide education, explanation, and written materials on patient's individual exercise prescription       Expected Outcomes  Short Term: Able to explain program exercise prescription;Long Term: Able to explain home exercise prescription to exercise independently          Exercise Goals Re-Evaluation :   Discharge Exercise Prescription (Final Exercise Prescription Changes): Exercise Prescription Changes - 06/21/19 1400      Response to Exercise   Blood Pressure (Admit)  136/64    Blood Pressure (Exercise)  156/74    Blood Pressure (Exit)  144/74    Heart Rate (Admit)  86 bpm    Heart Rate (Exercise)  87 bpm    Heart Rate (Exit)  58 bpm    Oxygen Saturation (Admit)  97 %    Oxygen Saturation (Exercise)  97 %    Rating of Perceived Exertion (Exercise)  12    Symptoms  fatigue at end    Comments  walk test results       Nutrition:  Target Goals: Understanding of nutrition  guidelines, daily intake of sodium '1500mg'$ , cholesterol '200mg'$ , calories 30% from fat and 7% or less from saturated fats, daily to have 5 or more servings of fruits and vegetables.  Education: Controlling Sodium/Reading Food Labels -Group verbal and written material supporting the discussion of sodium use in heart healthy nutrition. Review and explanation with models, verbal and written materials  for utilization of the food label.   Cardiac Rehab from 05/15/2017 in [facility] Cardiac and Pulmonary Rehab  Date  04/15/17  Educator  CR  Instruction Review Code  1- [employer] Understanding      Education: General Nutrition Guidelines/Fats and Fiber: -Group instruction provided by verbal, written material, models and posters to present the general guidelines for heart healthy nutrition. Gives an explanation and review of dietary fats and fiber.   Cardiac Rehab from 05/15/2017 in [facility] Cardiac and Pulmonary Rehab  Date  04/08/17  Educator  CR  Instruction Review Code  1- Verbalizes Understanding      Biometrics: Pre Biometrics - 06/21/19 1448      Pre Biometrics   Height  '5\' 2"'$  (1.575 m)    Weight  133 lb 11.2 oz (60.6 kg)    BMI (Calculated)  24.45    Single Leg Stand  3.22 seconds        Nutrition Therapy Plan and Nutrition Goals:   Nutrition Assessments: Nutrition Assessments - 06/21/19 1449      MEDFICTS Scores   Pre Score  24       MEDIFICTS Score Key:          ?70 Need to make dietary changes          40-70 Heart Healthy Diet         ? 40 Therapeutic Level Cholesterol Diet  Nutrition Goals Re-Evaluation:   Nutrition Goals Discharge (Final Nutrition Goals Re-Evaluation):   Psychosocial: Target Goals: Acknowledge presence or absence of significant depression and/or stress, maximize coping skills, provide positive support system. Participant is able to verbalize types and ability to use techniques and skills needed for reducing stress and depression.   Education:  Depression - Provides group verbal and written instruction on the correlation between heart/lung disease and depressed mood, treatment options, and the stigmas associated with seeking treatment.   Cardiac Rehab from 05/15/2017 in [facility] Cardiac and Pulmonary Rehab  Date  04/29/17  Educator  [facility]  Instruction Review Code  1- [employer] Understanding      Education: Sleep Hygiene -Provides group verbal and written instruction about how sleep can affect your health.  Define sleep hygiene, discuss sleep cycles and impact of sleep habits. Review good sleep hygiene tips.    Cardiac Rehab from 05/15/2017 in [facility] Cardiac and Pulmonary Rehab  Date  04/10/17  Educator  [facility]  Instruction Review Code  1- Verbalizes Understanding       Education: Stress and Anxiety: - Provides group verbal and written instruction about the health risks of elevated stress and causes of high stress.  Discuss the correlation between heart/lung disease and anxiety and treatment options. Review healthy ways to manage with stress and anxiety.   Cardiac Rehab from 05/15/2017 in [facility] Cardiac and Pulmonary Rehab  Date  05/13/17  Educator  [facility]  Instruction Review Code  1- Verbalizes Understanding       Initial Review & Psychosocial Screening: Initial Psych Review & Screening - 06/17/19 1109      Initial Review   Current issues with  None Identified      Family Dynamics   Good Support System?  Yes   children     Barriers   Psychosocial barriers to participate in program  There are no identifiable barriers or psychosocial needs.;The patient should benefit from training in stress management and relaxation.      Screening Interventions   Expected Outcomes  Short Term goal: Utilizing psychosocial counselor, staff and  physician to assist with identification of specific Stressors or current issues interfering with healing process. Setting desired goal for each stressor or current issue identified.;Long Term Goal: Stressors or  current issues are controlled or eliminated.;Short Term goal: Identification and review with participant of any Quality of Life or Depression concerns found by scoring the questionnaire.;Long Term goal: The participant improves quality of Life and PHQ9 Scores as seen by post scores and/or verbalization of changes       Quality of Life Scores:  Quality of Life - 06/21/19 1448      Quality of Life   Select  Quality of Life      Quality of Life Scores   Health/Function Pre  27.43 %    Socioeconomic Pre  30 %    Psych/Spiritual Pre  30 %    Family Pre  30 %    GLOBAL Pre  28.8 %      Scores of 19 and below usually indicate a poorer quality of life in these areas.  A difference of  2-3 points is a clinically meaningful difference.  A difference of 2-3 points in the total score of the Quality of Life Index has been associated with significant improvement in overall quality of life, self-image, physical symptoms, and general health in studies assessing change in quality of life.  PHQ-9: Recent Review Flowsheet Data    Depression screen [facility] 2/9 06/21/2019 04/14/2019 04/08/2018 05/13/2017 03/31/2017   Decreased Interest 0 0 0 0 0   Down, Depressed, Hopeless 0 0 0 0 0   PHQ - 2 Score 0 0 0 0 0   Altered sleeping 0 0 0 0 0   Tired, decreased energy 1 0 0 0 0   Change in appetite 0 0 0 0 0   Feeling bad or failure about yourself  0 0 0 0 0   Trouble concentrating 0 0 0 0 0   Moving slowly or fidgety/restless 0 0 0 0 0   Suicidal thoughts 0 0 0 0 0   PHQ-9 Score 1 0 0 0 0   Difficult doing work/chores Somewhat difficult Not difficult at all Not difficult at all Not difficult at all Not difficult at all     Interpretation of Total Score  Total Score Depression Severity:  1-4 = Minimal depression, 5-9 = Mild depression, 10-14 = Moderate depression, 15-19 = Moderately severe depression, 20-27 = Severe depression   Psychosocial Evaluation and Intervention: Psychosocial Evaluation - 06/17/19 1109       Psychosocial Evaluation & Interventions   Interventions  Encouraged to exercise with the program and follow exercise prescription    Comments  Daina reports doing well. She has a great support system of children and grandchildren. She states doing fine during the pandemic, staying home. She sometimes still struggles with appetite, but she is working on it. She wants to come to the program to get  stronger and feel like she has increased stamina    Expected Outcomes  Short: attend Cardiac Rehab for exercise and education. Long: maintain positive self care habits.    Continue Psychosocial Services   Follow up required by staff       Psychosocial Re-Evaluation:   Psychosocial Discharge (Final Psychosocial Re-Evaluation):   Vocational Rehabilitation: Provide vocational rehab assistance to qualifying candidates.   Vocational Rehab Evaluation & Intervention: Vocational Rehab - 06/17/19 1105      Initial Vocational Rehab Evaluation & Intervention   Assessment shows need for Vocational Rehabilitation  No       Education: Education Goals: Education classes will be provided on a variety of topics geared toward better understanding of heart health and risk factor modification. Participant will state understanding/return demonstration of topics presented as noted by education test scores.  Learning Barriers/Preferences: Learning Barriers/Preferences - 06/17/19 1105      Learning Barriers/Preferences   Learning Barriers  Hearing    Learning Preferences  None       General Cardiac Education Topics:  AED/CPR: - Group verbal and written instruction with the use of models to demonstrate the basic use of the AED with the basic ABC's of resuscitation.   Cardiac Rehab from 05/15/2017 in [facility] Cardiac and Pulmonary Rehab  Date  04/22/17  Educator  SB  Instruction Review Code  1- Verbalizes Understanding      Anatomy & Physiology of the Heart: - Group verbal and written instruction and  models provide basic cardiac anatomy and physiology, with the coronary electrical and arterial systems. Review of Valvular disease and Heart Failure   Cardiac Rehab from 05/15/2017 in [facility] Cardiac and Pulmonary Rehab  Date  04/03/17  Educator  CE  Instruction Review Code  1- Verbalizes Understanding      Cardiac Procedures: - Group verbal and written instruction to review commonly prescribed medications for heart disease. Reviews the medication, class of the drug, and side effects. Includes the steps to properly store meds and maintain the prescription regimen. (beta blockers and nitrates)   Cardiac Rehab from 05/15/2017 in [facility] Cardiac and Pulmonary Rehab  Date  04/17/17  Educator  CE  Instruction Review Code  1- Verbalizes Understanding      Cardiac Medications I: - Group verbal and written instruction to review commonly prescribed medications for heart disease. Reviews the medication, class of the drug, and side effects. Includes the steps to properly store meds and maintain the prescription regimen.   Cardiac Medications II: -Group verbal and written instruction to review commonly prescribed medications for heart disease. Reviews the medication, class of the drug, and side effects. (all other drug classes)   Cardiac Rehab from 05/15/2017 in [facility] Cardiac and Pulmonary Rehab  Date  05/15/17 [facility] Factors]  Educator  [facility]  Instruction Review Code  1- Verbalizes Understanding       Go Sex-Intimacy & Heart Disease, Get SMART - Goal Setting: - Group verbal and written instruction through game format to discuss heart disease and the return to sexual intimacy. Provides group verbal and written material to discuss and apply goal setting through the application of the S.M.A.R.T. Method.   Cardiac Rehab from 05/15/2017 in [facility] Cardiac and Pulmonary Rehab  Date  04/17/17  Educator  CE  Instruction Review Code  1- Verbalizes Understanding      Other Matters of the Heart: - Provides group  verbal, written materials and models to describe Stable Angina and Peripheral Artery. Includes description of the disease process and treatment options available to the cardiac patient.   Cardiac Rehab from 05/15/2017 in [facility] Cardiac and Pulmonary Rehab  Date  04/03/17  Educator  CE  Instruction Review Code  1- Verbalizes Understanding      Infection Prevention: - Provides verbal and written material to individual with discussion of infection control including proper hand washing and proper equipment cleaning during exercise session.   Cardiac Rehab from 06/21/2019 in [facility] Cardiac and Pulmonary Rehab  Date  06/21/19  Educator  [facility]  Instruction Review Code  1- Verbalizes Understanding  Falls Prevention: - Provides verbal and written material to individual with discussion of falls prevention and safety.   Cardiac Rehab from 06/21/2019 in [facility] Cardiac and Pulmonary Rehab  Date  06/21/19  Educator  [facility]  Instruction Review Code  1- Verbalizes Understanding      Other: -Provides group and verbal instruction on various topics (see comments)   Knowledge Questionnaire Score: Knowledge Questionnaire Score - 06/21/19 1449      Knowledge Questionnaire Score   Pre Score  22/26 Education Focus: HR, nutrition, exercise       Core Components/Risk Factors/Patient Goals at Admission: Personal Goals and Risk Factors at Admission - 06/21/19 1449      Core Components/Risk Factors/Patient Goals on Admission    Weight Management  Yes;Weight Maintenance    Intervention  Weight Management: Develop a combined nutrition and exercise program designed to reach desired caloric intake, while maintaining appropriate intake of nutrient and fiber, sodium and fats, and appropriate energy expenditure required for the weight goal.;Weight Management: Provide education and appropriate resources to help participant work on and attain dietary goals.    Admit Weight  133 lb 11.2 oz (60.6 kg)    Goal Weight: Short  Term  133 lb (60.3 kg)    Goal Weight: Long Term  133 lb (60.3 kg)    Expected Outcomes  Short Term: Continue to assess and modify interventions until short term weight is achieved;Long Term: Adherence to nutrition and physical activity/exercise program aimed toward attainment of established weight goal;Weight Maintenance: Understanding of the daily nutrition guidelines, which includes 25-35% calories from fat, 7% or less cal from saturated fats, less than '200mg'$  cholesterol, less than 1.5gm of sodium, & 5 or more servings of fruits and vegetables daily    Hypertension  Yes    Intervention  Provide education on lifestyle modifcations including regular physical activity/exercise, weight management, moderate sodium restriction and increased consumption of fresh fruit, vegetables, and low fat dairy, alcohol moderation, and smoking cessation.;Monitor prescription use compliance.    Expected Outcomes  Short Term: Continued assessment and intervention until BP is < 140/27m HG in hypertensive participants. < 130/852mHG in hypertensive participants with diabetes, heart failure or chronic kidney disease.;Long Term: Maintenance of blood pressure at goal levels.    Lipids  Yes    Intervention  Provide education and support for participant on nutrition & aerobic/resistive exercise along with prescribed medications to achieve LDL '70mg'$ , HDL >'40mg'$ .    Expected Outcomes  Short Term: Participant states understanding of desired cholesterol values and is compliant with medications prescribed. Participant is following exercise prescription and nutrition guidelines.;Long Term: Cholesterol controlled with medications as prescribed, with individualized exercise RX and with personalized nutrition plan. Value goals: LDL < '70mg'$ , HDL > 40 mg.       Education:Diabetes - Individual verbal and written instruction to review signs/symptoms of diabetes, desired ranges of glucose level fasting, after meals and with exercise.  Acknowledge that pre and post exercise glucose checks will be done for 3 sessions at entry of program.   Education: Know Your Numbers and Risk Factors: -Group verbal and written instruction about important numbers in your health.  Discussion of what are risk factors and how they play a role in the disease process.  Review of Cholesterol, Blood Pressure, Diabetes, and BMI and the role they play in your overall health.   [facility]  [facility]  [facility]  [facility]  [facility]  1- Verbalizes Understanding      Core Components/Risk Factors/Patient Goals Review:    Core Components/Risk Factors/Patient Goals at Discharge (Final Review):    ITP Comments: ITP Comments    Row Name 06/17/19 1108 06/21/19 1440         ITP Comments  Initial telephone encounter completed. Diagnosis can be found in [facility] 3/12. EP orientation scheduled for 4/12 at 11am  Completed 6MWT and gym orientation.  Initial ITP created and sent for review to Dr. Emily Filbert, Medical Director.         Comments: Initial ITP

## 2019-06-23 ENCOUNTER — Other Ambulatory Visit: Payer: Self-pay

## 2019-06-23 ENCOUNTER — Encounter: Payer: Medicare Other | Admitting: *Deleted

## 2019-06-23 DIAGNOSIS — I252 Old myocardial infarction: Secondary | ICD-10-CM | POA: Diagnosis not present

## 2019-06-23 DIAGNOSIS — I214 Non-ST elevation (NSTEMI) myocardial infarction: Secondary | ICD-10-CM

## 2019-06-23 NOTE — Progress Notes (Signed)
Daily Session Note  Patient Details  Name: Jenna Young MRN: [id number] Date of Birth: [birth year] Referring Provider:     Cardiac Rehab from 06/21/2019 in [facility] Cardiac and Pulmonary Rehab  Referring Provider  Ida Rogue MD      Encounter Date: 06/23/2019  Check In: Session Check In - 06/23/19 1339      Check-In   Supervising physician immediately available to respond to emergencies  See telemetry face sheet for immediately available ER MD    Location  [facility]-Cardiac & Pulmonary Rehab    Staff Present  Renita Papa, RN BSN;Joseph Hood RCP,RRT,BSRT;Melissa [place] RDN, LDN    Virtual Visit  No    Medication changes reported      No    Fall or balance concerns reported     No    Warm-up and Cool-down  Performed on first and last piece of equipment    Resistance Training Performed  Yes    VAD Patient?  No    PAD/SET Patient?  No      Pain Assessment   Currently in Pain?  No/denies          Social History   Tobacco Use  Smoking Status Former Smoker  . Quit date: 03/11/1968  . Years since quitting: 51.3  Smokeless Tobacco Never Used    Goals Met:  Independence with exercise equipment Exercise tolerated well No report of cardiac concerns or symptoms Strength training completed today  Goals Unmet:  Not Applicable  Comments: First full day of exercise!  Patient was oriented to gym and equipment including functions, settings, policies, and procedures.  Patient's individual exercise prescription and treatment plan were reviewed.  All starting workloads were established based on the results of the 6 minute walk test done at initial orientation visit.  The plan for exercise progression was also introduced and progression will be customized based on patient's performance and goals.    Dr. Emily Filbert is Medical Director for [place] and [facility].

## 2019-06-24 ENCOUNTER — Other Ambulatory Visit: Payer: Self-pay

## 2019-06-24 ENCOUNTER — Telehealth: Payer: Self-pay | Admitting: Cardiovascular Disease

## 2019-06-24 ENCOUNTER — Encounter: Payer: Medicare Other | Admitting: *Deleted

## 2019-06-24 DIAGNOSIS — I214 Non-ST elevation (NSTEMI) myocardial infarction: Secondary | ICD-10-CM

## 2019-06-24 DIAGNOSIS — I252 Old myocardial infarction: Secondary | ICD-10-CM | POA: Diagnosis not present

## 2019-06-24 MED ORDER — CARVEDILOL 6.25 MG PO TABS: 6.2500 mg | ORAL_TABLET | Freq: Two times a day (BID) | ORAL | 0 refills | Status: DC

## 2019-06-24 MED ORDER — LOSARTAN POTASSIUM 25 MG PO TABS: 25.0000 mg | ORAL_TABLET | Freq: Every day | ORAL | 0 refills | Status: DC

## 2019-06-24 NOTE — Telephone Encounter (Signed)
Requested Prescriptions   Signed Prescriptions Disp Refills   carvedilol (COREG) 6.25 MG tablet 180 tablet 0    Sig: Take 1 tablet (6.25 mg total) by mouth 2 (two) times daily with a meal.    Authorizing Provider: Minna Merritts    Ordering User: Janan Ridge   losartan (COZAAR) 25 MG tablet 90 tablet 0    Sig: Take 1 tablet (25 mg total) by mouth daily.    Authorizing Provider: Minna Merritts    Ordering User: Janan Ridge

## 2019-06-24 NOTE — Progress Notes (Signed)
Daily Session Note  Patient Details  Name: Zari Cly MRN: [id number] Date of Birth: 07-08-1931 Referring Provider:     Cardiac Rehab from 06/21/2019 in [facility] Cardiac and Pulmonary Rehab  Referring Provider  Ida Rogue MD      Encounter Date: 06/24/2019  Check In: Session Check In - 06/24/19 1316      Check-In   Supervising physician immediately available to respond to emergencies  See telemetry face sheet for immediately available ER MD    Location  [facility]-Cardiac & Pulmonary Rehab    Staff Present  Heath Lark, RN, BSN, CCRP;Meredith [place], RN BSN;Jessica [place], MA, RCEP, CCRP, CCET;Joseph [place], [place], ACSM CEP, Exercise Physiologist    Virtual Visit  No    Medication changes reported      No    Fall or balance concerns reported     No    Warm-up and Cool-down  Performed on first and last piece of equipment    Resistance Training Performed  Yes    VAD Patient?  No    PAD/SET Patient?  No      Pain Assessment   Currently in Pain?  No/denies          Social History   Tobacco Use  Smoking Status Former Smoker  . Quit date: 03/11/1968  . Years since quitting: 51.3  Smokeless Tobacco Never Used    Goals Met:  Exercise tolerated well No report of cardiac concerns or symptoms  Goals Unmet:  Not Applicable  Comments: Pt able to follow exercise prescription today without complaint.  Will continue to monitor for progression.    Dr. Emily Filbert is Medical Director for [place] and [facility].

## 2019-06-24 NOTE — Telephone Encounter (Signed)
Requested Prescriptions   Signed Prescriptions Disp Refills  . carvedilol (COREG) 6.25 MG tablet 180 tablet 0    Sig: Take 1 tablet (6.25 mg total) by mouth 2 (two) times daily with a meal.    Authorizing Provider: Minna Merritts    Ordering User: Janan Ridge losartan (COZAAR) 25 MG tablet 90 tablet 0    Sig: Take 1 tablet (25 mg total) by mouth daily.    Authorizing Provider: Minna Merritts    Ordering User: Janan Ridge

## 2019-06-24 NOTE — Telephone Encounter (Signed)
*  STAT* If patient is at the pharmacy, call can be transferred to refill team.   1. Which medications need to be refilled? (please list name of each medication and dose if known) carvedilol 6.25 mg bid, losartan 25  2. Which pharmacy/location (including street and city if local pharmacy) is medication to be sent to? [address]  3. Do they need a 30 day or 90 day supply? [place]

## 2019-06-28 ENCOUNTER — Other Ambulatory Visit: Payer: Self-pay

## 2019-06-28 ENCOUNTER — Encounter: Payer: Medicare Other | Admitting: *Deleted

## 2019-06-28 DIAGNOSIS — I214 Non-ST elevation (NSTEMI) myocardial infarction: Secondary | ICD-10-CM

## 2019-06-28 DIAGNOSIS — I252 Old myocardial infarction: Secondary | ICD-10-CM | POA: Diagnosis not present

## 2019-06-28 NOTE — Progress Notes (Signed)
Daily Session Note  Patient Details  Name: Jenna Young MRN: [id number] Date of Birth: 07/22/1931 Referring Provider:     Cardiac Rehab from 06/21/2019 in [facility] Cardiac and Pulmonary Rehab  Referring Provider  Gollan, Timothy MD      Encounter Date: 06/28/2019  Check In: Session Check In - 06/28/19 1320      Check-In   Supervising physician immediately available to respond to emergencies  See telemetry face sheet for immediately available ER MD    Location  [facility]-Cardiac & Pulmonary Rehab    Staff Present  Susanne Bice, RN, BSN, CCRP;Melissa [place] RDN, LDN;Joseph Hood RCP,RRT,BSRT;Kelly Hayes, BS, ACSM CEP, Exercise Physiologist    Virtual Visit  No    Medication changes reported      No    Fall or balance concerns reported     No    Warm-up and Cool-down  Performed on first and last piece of equipment    Resistance Training Performed  Yes    VAD Patient?  No    PAD/SET Patient?  No      Pain Assessment   Currently in Pain?  No/denies          Social History   Tobacco Use  Smoking Status Former Smoker  . Quit date: 03/11/1968  . Years since quitting: 51.3  Smokeless Tobacco Never Used    Goals Met:  Independence with exercise equipment Exercise tolerated well No report of cardiac concerns or symptoms  Goals Unmet:  Not Applicable  Comments: Pt able to follow exercise prescription today without complaint.  Will continue to monitor for progression.    Dr. Mark Miller is Medical Director for [facility] and [facility]. 

## 2019-06-30 ENCOUNTER — Encounter: Payer: Medicare Other | Admitting: *Deleted

## 2019-06-30 ENCOUNTER — Other Ambulatory Visit: Payer: Self-pay

## 2019-06-30 ENCOUNTER — Encounter: Payer: Self-pay | Admitting: *Deleted

## 2019-06-30 DIAGNOSIS — I214 Non-ST elevation (NSTEMI) myocardial infarction: Secondary | ICD-10-CM

## 2019-06-30 DIAGNOSIS — I252 Old myocardial infarction: Secondary | ICD-10-CM | POA: Diagnosis not present

## 2019-06-30 NOTE — Progress Notes (Signed)
Cardiac Individual Treatment Plan  Patient Details  Name: Jenna Young MRN: [id number] Date of Birth: 1931-12-15 Referring Provider:     Cardiac Rehab from 06/21/2019 in [facility] Cardiac and Pulmonary Rehab  Referring Provider  Ida Rogue MD      Initial Encounter Date:    Cardiac Rehab from 06/21/2019 in [facility] Cardiac and Pulmonary Rehab  Date  06/21/19      Visit Diagnosis: NSTEMI (non-ST elevated myocardial infarction) [facility])  Patient's Home Medications on Admission:  Current Outpatient Medications:  .  aspirin (ASPIRIN EC) 81 MG EC tablet, Take 81 mg by mouth daily. Swallow whole. , Disp: , Rfl:  .  atorvastatin (LIPITOR) 40 MG tablet, Take 1 tablet (40 mg total) by mouth daily at 6 PM., Disp: 90 tablet, Rfl: 0 .  carvedilol (COREG) 6.25 MG tablet, Take 1 tablet (6.25 mg total) by mouth 2 (two) times daily with a meal., Disp: 180 tablet, Rfl: 0 .  Cholecalciferol (VITAMIN D) 2000 UNITS CAPS, Take 1 capsule by mouth daily.  , Disp: , Rfl:  .  clopidogrel (PLAVIX) 75 MG tablet, Take 1 tablet (75 mg total) by mouth daily with breakfast., Disp: 90 tablet, Rfl: 3 .  cyanocobalamin (,VITAMIN B-12,) 1000 MCG/ML injection, Inject 1,000 mcg into the muscle every 3 (three) months., Disp: , Rfl:  .  isosorbide mononitrate (IMDUR) 30 MG 24 hr tablet, TAKE ONE-HALF TABLET BY MOUTH EVERY DAY, Disp: 15 tablet, Rfl: 3 .  losartan (COZAAR) 25 MG tablet, Take 1 tablet (25 mg total) by mouth daily., Disp: 90 tablet, Rfl: 0 .  moxifloxacin (VIGAMOX) 0.5 % ophthalmic solution, INSTILL 1 DROP IN BOTH EYES FOUR TIMES DAILY FOR 7 DAYS, Disp: , Rfl:  .  nitroGLYCERIN (NITROSTAT) 0.4 MG SL tablet, PLACE 1 TABLET UNDER TONGUE EVERY 5 MIN AS NEEDED FOR CHEST PAIN IF NO RELIEF IN15 MIN CALL 911 (MAX 3 TABS), Disp: 25 tablet, Rfl: 1 .  omeprazole (PRILOSEC) 20 MG capsule, Take 1 capsule (20 mg total) by mouth daily as needed., Disp: 90 capsule, Rfl: 3 .  terconazole (TERAZOL 7) 0.4 % vaginal cream, Apply  to affected areas of vulva daily for itching as needed, Disp: 45 g, Rfl: 0  Past Medical History: Past Medical History:  Diagnosis Date  . Allergic rhinitis   . Anemia   . Anxiety   . GERD (gastroesophageal reflux disease)   . History of radiation therapy 04/29/16- 05/24/16   Left Breast 40.05 Gy in 15 fractions, Left Breast boost 10 Gy in 5 fractions.   . Hyperlipidemia   . Hypertension    treated by Dr Thurnell Garbe recently with syncope episode  . Osteopenia   . Osteoporosis   . Personal history of radiation therapy 2018    Tobacco Use: Social History   Tobacco Use  Smoking Status Former Smoker  . Quit date: 03/11/1968  . Years since quitting: 51.3  Smokeless Tobacco Never Used    Labs: Recent Review Flowsheet Data    Labs for ITP Cardiac and Pulmonary Rehab Latest Ref Rng & Units 02/11/2017 03/31/2017 10/17/2017 04/08/2018 04/14/2019   Cholestrol 0 - 200 mg/dL 173 125 121 140 200   LDLCALC 0 - 99 mg/dL 100(H) 56 51 66 121(H)   LDLDIRECT mg/dL - - - - -   HDL >39.00 mg/dL 53 50.50 53 54.00 55.50   Trlycerides 0.0 - 149.0 mg/dL 98 92.0 83 101.0 117.0   Hemoglobin A1c 4.6 - 6.5 % - - - - 6.2  TCO2 0 - 100 mmol/L - - - - -       Exercise Target Goals: Exercise Program Goal: Individual exercise prescription set using results from initial 6 min walk test and THRR while considering  patient's activity barriers and safety.   Exercise Prescription Goal: Initial exercise prescription builds to 30-45 minutes a day of aerobic activity, 2-3 days per week.  Home exercise guidelines will be given to patient during program as part of exercise prescription that the participant will acknowledge.   Education: Aerobic Exercise & Resistance Training: - Gives group verbal and written instruction on the various components of exercise. Focuses on aerobic and resistive training programs and the benefits of this training and how to safely progress through these programs..   Cardiac Rehab from  05/15/2017 in [facility] Cardiac and Pulmonary Rehab  Date  03/13/17  Educator  [facility]  Instruction Review Code  1- Verbalizes Understanding      Education: Exercise & Equipment Safety: - Individual verbal instruction and demonstration of equipment use and safety with use of the equipment.   Cardiac Rehab from 06/21/2019 in [facility] Cardiac and Pulmonary Rehab  Date  06/21/19  Educator  [facility]  Instruction Review Code  1- Verbalizes Understanding      Education: Exercise Physiology & General Exercise Guidelines: - Group verbal and written instruction with models to review the exercise physiology of the cardiovascular system and associated critical values. Provides general exercise guidelines with specific guidelines to those with heart or lung disease.    Cardiac Rehab from 05/15/2017 in [facility] Cardiac and Pulmonary Rehab  Date  05/01/17  Educator  [facility]  Instruction Review Code  1- Verbalizes Understanding      Education: Flexibility, Balance, Mind/Body Relaxation: Provides group verbal/written instruction on the benefits of flexibility and balance training, including mind/body exercise modes such as yoga, pilates and tai chi.  Demonstration and skill practice provided.   Cardiac Rehab from 05/15/2017 in [facility] Cardiac and Pulmonary Rehab  Date  05/08/17  Educator  AS  Instruction Review Code  1- Verbalizes Understanding      Activity Barriers & Risk Stratification: Activity Barriers & Cardiac Risk Stratification - 06/21/19 1445      Activity Barriers & Cardiac Risk Stratification   Activity Barriers  Back Problems;Deconditioning;Muscular Weakness;Balance Concerns    Cardiac Risk Stratification  High       6 Minute Walk: 6 Minute Walk    Row Name 06/21/19 1444         6 Minute Walk   Phase  Initial     Distance  730 feet     Walk Time  6 minutes     MPH  1.38     METS  1.17     RPE  12     VO2 Peak  4.11     Symptoms  No     Resting HR  56 bpm     Resting BP  136/64     Resting Oxygen  Saturation   97 %     Exercise Oxygen Saturation  during 6 min walk  97 %     Max Ex. HR  87 bpm     Max Ex. BP  156/74     2 Minute Post BP  144/74        Oxygen Initial Assessment:   Oxygen Re-Evaluation:   Oxygen Discharge (Final Oxygen Re-Evaluation):   Initial Exercise Prescription: Initial Exercise Prescription - 06/21/19 1400      Date  of Initial Exercise RX and Referring Provider   Date  06/21/19    Referring Provider  Ida Rogue MD      Treadmill   MPH  1.3    Grade  0    Minutes  15    METs  2      NuStep   Level  1    SPM  80    Minutes  15    METs  2      Biostep-RELP   Level  1    SPM  50    Minutes  15    METs  2      Prescription Details   Frequency (times per week)  3    Duration  Progress to 30 minutes of continuous aerobic without signs/symptoms of physical distress      Intensity   THRR 40-80% of Max Heartrate  88-119    Ratings of Perceived Exertion  11-13    Perceived Dyspnea  0-4      Progression   Progression  Continue to progress workloads to maintain intensity without signs/symptoms of physical distress.      Resistance Training   Training Prescription  Yes    Weight  3 lb    Reps  10-15       Perform Capillary Blood Glucose checks as needed.  Exercise Prescription Changes: Exercise Prescription Changes    Row Name 06/21/19 1400             Response to Exercise   Blood Pressure (Admit)  136/64       Blood Pressure (Exercise)  156/74       Blood Pressure (Exit)  144/74       Heart Rate (Admit)  86 bpm       Heart Rate (Exercise)  87 bpm       Heart Rate (Exit)  58 bpm       Oxygen Saturation (Admit)  97 %       Oxygen Saturation (Exercise)  97 %       Rating of Perceived Exertion (Exercise)  12       Symptoms  fatigue at end       Comments  walk test results          Exercise Comments:   Exercise Goals and Review: Exercise Goals    Row Name 06/21/19 1447             Exercise Goals   Increase  Physical Activity  Yes       Intervention  Provide advice, education, support and counseling about physical activity/exercise needs.;Develop an individualized exercise prescription for aerobic and resistive training based on initial evaluation findings, risk stratification, comorbidities and participant's personal goals.       Expected Outcomes  Short Term: Attend rehab on a regular basis to increase amount of physical activity.;Long Term: Exercising regularly at least 3-5 days a week.;Long Term: Add in home exercise to make exercise part of routine and to increase amount of physical activity.       Increase Strength and Stamina  Yes       Intervention  Provide advice, education, support and counseling about physical activity/exercise needs.;Develop an individualized exercise prescription for aerobic and resistive training based on initial evaluation findings, risk stratification, comorbidities and participant's personal goals.       Expected Outcomes  Short Term: Increase workloads from initial exercise prescription for resistance, speed, and METs.;Short Term: Perform resistance training exercises  routinely during rehab and add in resistance training at home;Long Term: Improve cardiorespiratory fitness, muscular endurance and strength as measured by increased METs and functional capacity (6MWT)       Able to understand and use rate of perceived exertion (RPE) scale  Yes       Intervention  Provide education and explanation on how to use RPE scale       Expected Outcomes  Short Term: Able to use RPE daily in rehab to express subjective intensity level;Long Term:  Able to use RPE to guide intensity level when exercising independently       Able to understand and use Dyspnea scale  Yes       Intervention  Provide education and explanation on how to use Dyspnea scale       Expected Outcomes  Short Term: Able to use Dyspnea scale daily in rehab to express subjective sense of shortness of breath during  exertion;Long Term: Able to use Dyspnea scale to guide intensity level when exercising independently       Knowledge and understanding of Target Heart Rate Range (THRR)  Yes       Intervention  Provide education and explanation of THRR including how the numbers were predicted and where they are located for reference       Expected Outcomes  Short Term: Able to state/look up THRR;Long Term: Able to use THRR to govern intensity when exercising independently;Short Term: Able to use daily as guideline for intensity in rehab       Able to check pulse independently  Yes       Intervention  Provide education and demonstration on how to check pulse in carotid and radial arteries.;Review the importance of being able to check your own pulse for safety during independent exercise       Expected Outcomes  Short Term: Able to explain why pulse checking is important during independent exercise;Long Term: Able to check pulse independently and accurately       Understanding of Exercise Prescription  Yes       Intervention  Provide education, explanation, and written materials on patient's individual exercise prescription       Expected Outcomes  Short Term: Able to explain program exercise prescription;Long Term: Able to explain home exercise prescription to exercise independently          Exercise Goals Re-Evaluation : Exercise Goals Re-Evaluation    Row Name 06/23/19 1341             Exercise Goal Re-Evaluation   Exercise Goals Review  Increase Physical Activity;Able to understand and use rate of perceived exertion (RPE) scale;Knowledge and understanding of Target Heart Rate Range (THRR);Understanding of Exercise Prescription;Increase Strength and Stamina;Able to check pulse independently       Comments  Reviewed RPE scale, THR and program prescription with pt today.  Pt voiced understanding and was given a copy of goals to take home.       Expected Outcomes  Short: Use RPE daily to regulate intensity.  Long: Follow program prescription in THR.          Discharge Exercise Prescription (Final Exercise Prescription Changes): Exercise Prescription Changes - 06/21/19 1400      Response to Exercise   Blood Pressure (Admit)  136/64    Blood Pressure (Exercise)  156/74    Blood Pressure (Exit)  144/74    Heart Rate (Admit)  86 bpm    Heart Rate (Exercise)  87 bpm  Heart Rate (Exit)  58 bpm    Oxygen Saturation (Admit)  97 %    Oxygen Saturation (Exercise)  97 %    Rating of Perceived Exertion (Exercise)  12    Symptoms  fatigue at end    Comments  walk test results       Nutrition:  Target Goals: Understanding of nutrition guidelines, daily intake of sodium <152m, cholesterol <2049m calories 30% from fat and 7% or less from saturated fats, daily to have 5 or more servings of fruits and vegetables.  Education: Controlling Sodium/Reading Food Labels -Group verbal and written material supporting the discussion of sodium use in heart healthy nutrition. Review and explanation with models, verbal and written materials for utilization of the food label.   [facility]  Date  04/15/17  Educator  CR  Instruction Review Code  1- VeUnited States Steel Corporationnderstanding      Education: General Nutrition Guidelines/Fats and Fiber: -Group instruction provided by verbal, written material, models and posters to present the general guidelines for heart healthy nutrition. Gives an explanation and review of dietary fats and fiber.   [facility]  Date  04/08/17  Educator  CR  Instruction Review Code  1- Verbalizes Understanding      Biometrics: Pre Biometrics - 06/21/19 1448      Pre Biometrics   Height  _0  (1.575 m)    Weight  133 lb 11.2 oz (60.6 kg)    BMI (Calculated)  24.45    Single Leg Stand  3.22 seconds        Nutrition Therapy Plan and Nutrition Goals:   Nutrition  Assessments: Nutrition Assessments - 06/21/19 1449      MEDFICTS Scores   Pre Score  24       MEDIFICTS Score Key:          ?70 Need to make dietary changes          40-70 Heart Healthy Diet         ? 40 Therapeutic Level Cholesterol Diet  Nutrition Goals Re-Evaluation:   Nutrition Goals Discharge (Final Nutrition Goals Re-Evaluation):   Psychosocial: Target Goals: Acknowledge presence or absence of significant depression and/or stress, maximize coping skills, provide positive support system. Participant is able to verbalize types and ability to use techniques and skills needed for reducing stress and depression.   Education: Depression - Provides group verbal and written instruction on the correlation between heart/lung disease and depressed mood, treatment options, and the stigmas associated with seeking treatment.   [facility]  Date  04/29/17  [facility]  1- VeUnited States Steel Corporationnderstanding      Education: Sleep Hygiene -Provides group verbal and written instruction about how sleep can affect your health.  Define sleep hygiene, discuss sleep cycles and impact of sleep habits. Review good sleep hygiene tips.    [facility]  Date  04/10/17  [facility]  [facility]  1- Verbalizes Understanding       Education: Stress and Anxiety: - Provides group verbal and written instruction about the health risks of elevated stress and causes of high stress.  Discuss the correlation between heart/lung disease and anxiety and treatment options. Review healthy ways to manage with stress and anxiety.   [facility]  Date  05/13/17  Educator  [facility]  Instruction Review Code  1- Verbalizes Understanding       Initial Review & Psychosocial Screening: Initial Psych Review & Screening - 06/17/19 1109       Initial Review   Current issues with  None Identified      Family Dynamics   Good Support System?  Yes   children     Barriers   Psychosocial barriers to participate in program  There are no identifiable barriers or psychosocial needs.;The patient should benefit from training in stress management and relaxation.      Screening Interventions   Expected Outcomes  Short Term goal: Utilizing psychosocial counselor, staff and physician to assist with identification of specific Stressors or current issues interfering with healing process. Setting desired goal for each stressor or current issue identified.;Long Term Goal: Stressors or current issues are controlled or eliminated.;Short Term goal: Identification and review with participant of any Quality of Life or Depression concerns found by scoring the questionnaire.;Long Term goal: The participant improves quality of Life and PHQ9 Scores as seen by post scores and/or verbalization of changes       Quality of Life Scores:  Quality of Life - 06/21/19 1448      Quality of Life   Select  Quality of Life      Quality of Life Scores   Health/Function Pre  27.43 %    Socioeconomic Pre  30 %    Psych/Spiritual Pre  30 %    Family Pre  30 %    GLOBAL Pre  28.8 %      Scores of 19 and below usually indicate a poorer quality of life in these areas.  A difference of  2-3 points is a clinically meaningful difference.  A difference of 2-3 points in the total score of the Quality of Life Index has been associated with significant improvement in overall quality of life, self-image, physical symptoms, and general health in studies assessing change in quality of life.  PHQ-9: Recent Review Flowsheet Data    Depression screen [facility] 2/9 06/21/2019 04/14/2019 04/08/2018 05/13/2017 03/31/2017   Decreased Interest 0 0 0 0 0   Down, Depressed, Hopeless 0 0 0 0 0   PHQ - 2 Score 0 0 0 0 0   Altered sleeping 0 0 0 0 0   Tired, decreased energy 1 0 0 0 0   Change  in appetite 0 0 0 0 0   Feeling bad or failure about yourself  0 0 0 0 0   Trouble concentrating 0 0 0 0 0   Moving slowly or fidgety/restless 0 0 0 0 0   Suicidal thoughts 0 0 0 0 0   PHQ-9 Score 1 0 0 0 0   Difficult doing work/chores Somewhat difficult Not difficult at all Not difficult at all Not difficult at all Not difficult at all     Interpretation of Total Score  Total Score Depression Severity:  1-4 = Minimal depression, 5-9 = Mild depression, 10-14 = Moderate depression, 15-19 = Moderately severe depression, 20-27 = Severe depression   Psychosocial Evaluation and Intervention: Psychosocial Evaluation - 06/17/19 1109      Psychosocial Evaluation & Interventions   Interventions  Encouraged to exercise with the program and follow exercise prescription    Comments  Moana reports doing well. She has a great support system of children and grandchildren. She states doing fine during the pandemic,  staying home. She sometimes still struggles with appetite, but she is working on it. She wants to come to the program to get  stronger and feel like she has increased stamina    Expected Outcomes  Short: attend Cardiac Rehab for exercise and education. Long: maintain positive self care habits.    Continue Psychosocial Services   Follow up required by staff       Psychosocial Re-Evaluation:   Psychosocial Discharge (Final Psychosocial Re-Evaluation):   Vocational Rehabilitation: Provide vocational rehab assistance to qualifying candidates.   Vocational Rehab Evaluation & Intervention: Vocational Rehab - 06/17/19 1105      Initial Vocational Rehab Evaluation & Intervention   Assessment shows need for Vocational Rehabilitation  No       Education: Education Goals: Education classes will be provided on a variety of topics geared toward better understanding of heart health and risk factor modification. Participant will state understanding/return demonstration of topics presented as  noted by education test scores.  Learning Barriers/Preferences: Learning Barriers/Preferences - 06/17/19 1105      Learning Barriers/Preferences   Learning Barriers  Hearing    Learning Preferences  None       General Cardiac Education Topics:  AED/CPR: - Group verbal and written instruction with the use of models to demonstrate the basic use of the AED with the basic ABC's of resuscitation.   Cardiac Rehab from 05/15/2017 in [facility] Cardiac and Pulmonary Rehab  Date  04/22/17  Educator  SB  Instruction Review Code  1- Verbalizes Understanding      Anatomy & Physiology of the Heart: - Group verbal and written instruction and models provide basic cardiac anatomy and physiology, with the coronary electrical and arterial systems. Review of Valvular disease and Heart Failure   Cardiac Rehab from 05/15/2017 in [facility] Cardiac and Pulmonary Rehab  Date  04/03/17  Educator  CE  Instruction Review Code  1- Verbalizes Understanding      Cardiac Procedures: - Group verbal and written instruction to review commonly prescribed medications for heart disease. Reviews the medication, class of the drug, and side effects. Includes the steps to properly store meds and maintain the prescription regimen. (beta blockers and nitrates)   Cardiac Rehab from 05/15/2017 in [facility] Cardiac and Pulmonary Rehab  Date  04/17/17  Educator  CE  Instruction Review Code  1- Verbalizes Understanding      Cardiac Medications I: - Group verbal and written instruction to review commonly prescribed medications for heart disease. Reviews the medication, class of the drug, and side effects. Includes the steps to properly store meds and maintain the prescription regimen.   Cardiac Medications II: -Group verbal and written instruction to review commonly prescribed medications for heart disease. Reviews the medication, class of the drug, and side effects. (all other drug classes)   Cardiac Rehab from 05/15/2017 in [facility] Cardiac and  Pulmonary Rehab  Date  05/15/17 [facility]]  Educator  [facility]  Instruction Review Code  1- Verbalizes Understanding       Go Sex-Intimacy & Heart Disease, Get SMART - Goal Setting: - Group verbal and written instruction through game format to discuss heart disease and the return to sexual intimacy. Provides group verbal and written material to discuss and apply goal setting through the application of the S.M.A.R.T. Method.   Cardiac Rehab from 05/15/2017 in [facility] Cardiac and Pulmonary Rehab  Date  04/17/17  Educator  CE  Instruction Review Code  1- Verbalizes Understanding      Other Matters  of the Heart: - Provides group verbal, written materials and models to describe Stable Angina and Peripheral Artery. Includes description of the disease process and treatment options available to the cardiac patient.   Cardiac Rehab from 05/15/2017 in [facility] Cardiac and Pulmonary Rehab  Date  04/03/17  Educator  CE  Instruction Review Code  1- Verbalizes Understanding      Infection Prevention: - Provides verbal and written material to individual with discussion of infection control including proper hand washing and proper equipment cleaning during exercise session.   Cardiac Rehab from 06/21/2019 in [facility] Cardiac and Pulmonary Rehab  Date  06/21/19  Educator  [facility]  Instruction Review Code  1- Verbalizes Understanding      Falls Prevention: - Provides verbal and written material to individual with discussion of falls prevention and safety.   Cardiac Rehab from 06/21/2019 in [facility] Cardiac and Pulmonary Rehab  Date  06/21/19  Educator  [facility]  Instruction Review Code  1- Verbalizes Understanding      Other: -Provides group and verbal instruction on various topics (see comments)   Knowledge Questionnaire Score: Knowledge Questionnaire Score - 06/21/19 1449      Knowledge Questionnaire Score   Pre Score  22/26 Education Focus: HR, nutrition, exercise       Core Components/Risk Factors/Patient  Goals at Admission: Personal Goals and Risk Factors at Admission - 06/21/19 1449      Core Components/Risk Factors/Patient Goals on Admission    Weight Management  Yes;Weight Maintenance    Intervention  Weight Management: Develop a combined nutrition and exercise program designed to reach desired caloric intake, while maintaining appropriate intake of nutrient and fiber, sodium and fats, and appropriate energy expenditure required for the weight goal.;Weight Management: Provide education and appropriate resources to help participant work on and attain dietary goals.    Admit Weight  133 lb 11.2 oz (60.6 kg)    Goal Weight: Short Term  133 lb (60.3 kg)    Goal Weight: Long Term  133 lb (60.3 kg)    Expected Outcomes  Short Term: Continue to assess and modify interventions until short term weight is achieved;Long Term: Adherence to nutrition and physical activity/exercise program aimed toward attainment of established weight goal;Weight Maintenance: Understanding of the daily nutrition guidelines, which includes 25-35% calories from fat, 7% or less cal from saturated fats, less than 219m cholesterol, less than 1.5gm of sodium, & 5 or more servings of fruits and vegetables daily    Hypertension  Yes    Intervention  Provide education on lifestyle modifcations including regular physical activity/exercise, weight management, moderate sodium restriction and increased consumption of fresh fruit, vegetables, and low fat dairy, alcohol moderation, and smoking cessation.;Monitor prescription use compliance.    Expected Outcomes  Short Term: Continued assessment and intervention until BP is < 140/9108mHG in hypertensive participants. < 130/8026mG in hypertensive participants with diabetes, heart failure or chronic kidney disease.;Long Term: Maintenance of blood pressure at goal levels.    Lipids  Yes    Intervention  Provide education and support for participant on nutrition & aerobic/resistive exercise along  with prescribed medications to achieve LDL <60m31mDL >40mg16m Expected Outcomes  Short Term: Participant states understanding of desired cholesterol values and is compliant with medications prescribed. Participant is following exercise prescription and nutrition guidelines.;Long Term: Cholesterol controlled with medications as prescribed, with individualized exercise RX and with personalized nutrition plan. Value goals: LDL < 60mg,93m > 40 mg.  Education:Diabetes - Individual verbal and written instruction to review signs/symptoms of diabetes, desired ranges of glucose level fasting, after meals and with exercise. Acknowledge that pre and post exercise glucose checks will be done for 3 sessions at entry of program.   Education: Know Your Numbers and Risk Factors: -Group verbal and written instruction about important numbers in your health.  Discussion of what are risk factors and how they play a role in the disease process.  Review of Cholesterol, Blood Pressure, Diabetes, and BMI and the role they play in your overall health.   Cardiac Rehab from 05/15/2017 in [facility] Cardiac and Pulmonary Rehab  Date  05/15/17 [facility] Factors]  Educator  [facility]  Instruction Review Code  1- Verbalizes Understanding      Core Components/Risk Factors/Patient Goals Review:    Core Components/Risk Factors/Patient Goals at Discharge (Final Review):    ITP Comments: ITP Comments    Row Name 06/17/19 1108 06/21/19 1440 06/23/19 1340 06/30/19 0538     ITP Comments  Initial telephone encounter completed. Diagnosis can be found in [facility] 3/12. EP orientation scheduled for 4/12 at 11am  Completed 6MWT and gym orientation.  Initial ITP created and sent for review to Dr. Emily Filbert, Medical Director.  First full day of exercise!  Patient was oriented to gym and equipment including functions, settings, policies, and procedures.  Patient's individual exercise prescription and treatment plan were reviewed.  All starting  workloads were established based on the results of the 6 minute walk test done at initial orientation visit.  The plan for exercise progression was also introduced and progression will be customized based on patient's performance and goals.  30 Day review completed. Medical Director review done, changes made as directed,and approval shown by signature of Market researcher.  New to program       Comments:

## 2019-06-30 NOTE — Progress Notes (Signed)
Daily Session Note  Patient Details  Name: Jenna Young MRN: [id number] Date of Birth: [birth year] Referring Provider:     Cardiac Rehab from 06/21/2019 in [facility] Cardiac and Pulmonary Rehab  Referring Provider  Ida Rogue MD      Encounter Date: 06/30/2019  Check In: Session Check In - 06/30/19 1344      Check-In   Supervising physician immediately available to respond to emergencies  See telemetry face sheet for immediately available ER MD    Location  [facility]-Cardiac & Pulmonary Rehab    Staff Present  Renita Papa, RN BSN;Melissa Caiola RDN, Rowe Pavy, BA, ACSM CEP, Exercise Physiologist;Jessica Luan Pulling, MA, RCEP, CCRP, CCET    Virtual Visit  No    Medication changes reported      No    Fall or balance concerns reported     No    Warm-up and Cool-down  Performed on first and last piece of equipment    Resistance Training Performed  Yes    VAD Patient?  No    PAD/SET Patient?  No      Pain Assessment   Currently in Pain?  No/denies          Social History   Tobacco Use  Smoking Status Former Smoker  . Quit date: 03/11/1968  . Years since quitting: 51.3  Smokeless Tobacco Never Used    Goals Met:  Independence with exercise equipment Exercise tolerated well No report of cardiac concerns or symptoms Strength training completed today  Goals Unmet:  Not Applicable  Comments: Pt able to follow exercise prescription today without complaint.  Will continue to monitor for progression.    Dr. Emily Filbert is Medical Director for [place] and [facility].

## 2019-07-01 ENCOUNTER — Other Ambulatory Visit: Payer: Self-pay

## 2019-07-01 ENCOUNTER — Encounter: Payer: Medicare Other | Admitting: *Deleted

## 2019-07-01 DIAGNOSIS — I214 Non-ST elevation (NSTEMI) myocardial infarction: Secondary | ICD-10-CM

## 2019-07-01 DIAGNOSIS — I252 Old myocardial infarction: Secondary | ICD-10-CM | POA: Diagnosis not present

## 2019-07-01 NOTE — Progress Notes (Signed)
Daily Session Note  Patient Details  Name: Jenna Young MRN: [id number] Date of Birth: 1931-10-21 Referring Provider:     Cardiac Rehab from 06/21/2019 in [facility] Cardiac and Pulmonary Rehab  Referring Provider  Ida Rogue MD      Encounter Date: 07/01/2019  Check In: Session Check In - 07/01/19 1308      Check-In   Supervising physician immediately available to respond to emergencies  See telemetry face sheet for immediately available ER MD    Location  [facility]-Cardiac & Pulmonary Rehab    Staff Present  Heath Lark, RN, BSN, CCRP;Meredith Sherryll Burger, RN Vickki Hearing, BA, ACSM CEP, Exercise Physiologist;Joseph Tessie Fass RCP,RRT,BSRT    Virtual Visit  No    Medication changes reported      No    Fall or balance concerns reported     No    Warm-up and Cool-down  Performed on first and last piece of equipment    Resistance Training Performed  Yes    VAD Patient?  No    PAD/SET Patient?  No      Pain Assessment   Currently in Pain?  No/denies          Social History   Tobacco Use  Smoking Status Former Smoker  . Quit date: 03/11/1968  . Years since quitting: 51.3  Smokeless Tobacco Never Used    Goals Met:  Independence with exercise equipment Exercise tolerated well No report of cardiac concerns or symptoms  Goals Unmet:  Not Applicable  Comments: Pt able to follow exercise prescription today without complaint.  Will continue to monitor for progression.    Dr. Emily Filbert is Medical Director for [facility] and [facility].

## 2019-07-03 NOTE — Progress Notes (Signed)
Cardiology Office Note  Date:  07/05/2019   ID:  Jenna Young, DOB 1931-08-30, MRN [id number]  PCP:  Abner Greenspan, MD   Chief Complaint  Patient presents with  . office visit    1 month F/U-Patient reports syncopal epidose on 06/13/2019. Patient also reports having frequent diarrhea since restarting losartan; Meds verbally reviewed with patient.    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 EMTs arrived, she came too  Blood pressure at that time 135/54 rate 60 saturations fine blood sugar 123 Telemetry strips reviewed, normal sinus rhythm rate 60s nonspecific T wave abnormality inferior leads, V5 and V6  No further episodes since that time Periodic diarrhea, not associated with syncope in April  doing exercise Sometimes blood pressure low in the mornings Wonders if it is too low  Continues to have anemia  Iron infusions x2 Follows up with heme in 07/2019  EKG personally reviewed by myself on todays visit Shows normal sinus rhythm rate 60 bpm T wave abnormality V6, left axis deviation  Orthostatics performed today drop in blood pressure 152/68 down to 138/72 sitting down  to 122/63 standing recovery up to 146/75 Heart rate 55 up to 60 up to 71 with standing  Other past medical history reviewed Syncope 2020  on Saturday, was in the garden, 11 AM Reports that she was carrying some pot plants outside, started to feel dizzy Made her way inside but was dizzy, finally sat down inside Sister presents with her today reports that she had syncope per the son who was there with her Had bowel  incontinence Did not hurt herself   Chronic arm weakness started in March 2020 Stop the statin on her own symptoms did not get better Arm discomfort when moving the garbage can, raising her arms for long periods.  They feel tired  Prior episode of syncope while at the beauty parlor Had hair done, got up to a when she felt lightheaded and passed out  She did not fall or sustain any injuries from this event.   When EMS arrived patient's blood glucose was 137 and her blood pressure was 74/43 for which she received 500 cc of fluid.  In the hospital, TNT 2.48  started on heparin, medical management No cardiac catheterization performed as numbers were trending down and she was asymptomatic Etiology of the events unclear whether this was vasovagal, arrhythmia,  Lab work did not appear to be indicative of dehydration  hospital Admission for NSTEMI , 02/10/2017 D/c 02/13/2017  Cath 1. Significant mid RCA disease with sequential, calcified 80 and 95% stenoses. Element of thrombus is likely also present. 2. 90% ostial stenosis involving small OM1 branch. 3. Mildly reduced left ventricular contraction with  inferior hypokinesis (LVEF ~45%). 4. Mildly elevated left ventricular filling pressure. 5. Unsuccessful PCI to mid RCA due to inability to cross the mid RCA stenosis despite aggressive buddy wire support.  Recommendation : if she has unstable angina symptoms could considerPCI with atherectomy to mid RCA   Echo 0000000 Systolic function was normal. The   estimated ejection  fraction was in the range of 50% to 55%.   Hypokinesis of the inferior myocardium.  diagnosis of DCIS, had a biopsy bx Completed XRT   previously on amlodipine but wanted to change secondary to leg edema Started on bystolic Blood pressure continue to run high Started on HCTZ every other day  could not afford Bystolic long term   was changed to clonidine.  Presented to the emergency room December 31 2015 with near syncope , dehydration  "Fell asleep in the sun " while she was on a swing set, was difficult to arouse, was taken inside by family, had large bowel movement   in the emergency room heart rate 62 bpm , sodium down to 129, creatinine elevated Notes indicate she was only taking clonidine once a day   PMH:   has a past medical history of Allergic rhinitis, Anemia, Anxiety, GERD (gastroesophageal reflux disease), History of radiation therapy (04/29/16- 05/24/16), Hyperlipidemia, Hypertension, Osteopenia, Osteoporosis, and Personal history of radiation therapy (2018).  PSH:    Past Surgical History:  Procedure Laterality Date  . APPENDECTOMY    . BREAST BIOPSY Left 11/29/2008  . BREAST BIOPSY Left 01/26/2016   malignant  . BREAST EXCISIONAL BIOPSY Left 12/26/2008  . BREAST LUMPECTOMY Left 02/27/2016  . BREAST LUMPECTOMY Left 04/01/2016  . BREAST LUMPECTOMY WITH RADIOACTIVE SEED LOCALIZATION Left 02/27/2016   Procedure: LEFT BREAST LUMPECTOMY WITH RADIOACTIVE SEED LOCALIZATION;  Surgeon: Autumn Messing III, MD;  Location: [facility];  Service: General;  Laterality: Left;  . BREAST SURGERY  10/10   benign biopsy  negative  . CESAREAN SECTION    . CHOLECYSTECTOMY    . CORONARY STENT INTERVENTION N/A 02/11/2017   Procedure: CORONARY STENT INTERVENTION;  Surgeon: Nelva Bush, MD;  Location: [place];  Service: Cardiovascular;  Laterality: N/A;  . FEMUR FRACTURE SURGERY Right 2006  . LEFT HEART CATH AND CORONARY ANGIOGRAPHY N/A 02/11/2017   Procedure: LEFT  HEART CATH AND CORONARY ANGIOGRAPHY;  Surgeon: Nelva Bush, MD;  Location: [place];  Service: Cardiovascular;  Laterality: N/A;  . LEFT HEART CATH AND CORONARY ANGIOGRAPHY N/A 05/21/2019   Procedure: LEFT HEART CATH AND CORONARY ANGIOGRAPHY;  Surgeon: Wellington Hampshire, MD;  Location: [facility];  Service: Cardiovascular;  Laterality: N/A;  . OVARIAN CYST SURGERY    . RE-EXCISION OF BREAST LUMPECTOMY Left 04/01/2016   Procedure: RE-EXCISION OF LEFT BREAST MEDIAL MARGIN;  Surgeon: Autumn Messing III, MD;  Location: [facility];  Service: General;  Laterality: Left;    Current Outpatient Medications  Medication Sig Dispense Refill  . aspirin (ASPIRIN EC) 81 MG EC tablet Take 81 mg by mouth daily. Swallow whole.     Marland Kitchen atorvastatin (LIPITOR) 40 MG tablet Take 1 tablet (40 mg total) by mouth daily at 6 PM. 90 tablet 0  . carvedilol (COREG) 6.25 MG tablet Take 1 tablet (6.25 mg total) by mouth 2 (two) times daily with a meal. 180 tablet 0  . Cholecalciferol (VITAMIN D) 2000 UNITS CAPS Take 1 capsule by mouth daily.      . clopidogrel (PLAVIX) 75 MG tablet  Take 1 tablet (75 mg total) by mouth daily with breakfast. 90 tablet 3  . cyanocobalamin (,VITAMIN B-12,) 1000 MCG/ML injection Inject 1,000 mcg into the muscle every 3 (three) months.    . isosorbide mononitrate (IMDUR) 30 MG 24 hr tablet TAKE ONE-HALF TABLET BY MOUTH EVERY DAY 15 tablet 3  . losartan (COZAAR) 25 MG tablet Take 1 tablet (25 mg total) by mouth daily. 90 tablet 0  . nitroGLYCERIN (NITROSTAT) 0.4 MG SL tablet PLACE 1 TABLET UNDER TONGUE EVERY 5 MIN AS NEEDED FOR CHEST PAIN IF NO RELIEF IN15 MIN CALL 911 (MAX 3 TABS) 25 tablet 1  . omeprazole (PRILOSEC) 20 MG capsule Take 1 capsule (20 mg total) by mouth daily as needed. 90 capsule 3  . terconazole (TERAZOL 7) 0.4 % vaginal cream Apply to affected areas of vulva daily for itching as needed 45 g 0  . moxifloxacin (VIGAMOX) 0.5 % ophthalmic solution  INSTILL 1 DROP IN BOTH EYES FOUR TIMES DAILY FOR 7 DAYS     No current facility-administered medications for this visit.     Allergies:   Buspirone hcl, Codeine, Hydrochlorothiazide, Iron, Lansoprazole, Minocycline hcl, Penicillins, Phenergan [promethazine hcl], Ramipril, Ranexa [ranolazine], and Sulfa antibiotics   Social History:  The patient  reports that she quit smoking about 51 years ago. She has never used smokeless tobacco. She reports that she does not drink alcohol or use drugs.   Family History:   family history includes Cancer in her maternal aunt; Diabetes in her maternal aunt; Heart disease in her father; Hypertension in her mother; Macular degeneration in her sister.   Review of Systems  Constitutional: Positive for malaise/fatigue.  Eyes: Negative.   Respiratory: Negative.   Cardiovascular: Negative.   Gastrointestinal: Positive for constipation.  Genitourinary: Negative.   Musculoskeletal: Negative.        Bilateral arm pain worse on the right  Neurological: Negative.   Psychiatric/Behavioral: Negative.   All other systems reviewed and are negative.   PHYSICAL EXAM: VS:  BP (!) 144/70 (BP Location: Left Arm, Patient Position: Sitting, Cuff Size: Normal)   Pulse 60   Ht 5' (1.524 m)   Wt 131 lb 8 oz (59.6 kg)   SpO2 96%   BMI 25.68 kg/m  , BMI Body mass index is 25.68 kg/m.  Constitutional:  oriented to person, place, and time. No distress.  HENT:  Head: Grossly normal Eyes:  no discharge. No scleral icterus.  Neck: No JVD, no carotid bruits  Cardiovascular: Regular rate and rhythm, no murmurs appreciated Pulmonary/Chest: Clear to auscultation bilaterally, no wheezes or rails Abdominal: Soft.  no distension.  no tenderness.  Musculoskeletal: Normal range of motion Neurological:  normal muscle tone. Coordination normal. No atrophy Skin: Skin warm and dry Psychiatric: normal affect, pleasant  \ Recent Labs: 04/14/2019: TSH 2.25 05/21/2019: ALT 15; BUN 6;  Creatinine, Ser 0.81; Magnesium 2.0; Potassium 4.7; Sodium 136 05/31/2019: Hemoglobin 9.1; Platelets 195    Lipid Panel Lab Results  Component Value Date   CHOL 200 04/14/2019   HDL 55.50 04/14/2019   LDLCALC 121 (H) 04/14/2019   TRIG 117.0 04/14/2019      Wt Readings from Last 3 Encounters:  07/05/19 131 lb 8 oz (59.6 kg)  06/21/19 133 lb 11.2 oz (60.6 kg)  05/31/19 133 lb 2 oz (60.4 kg)      ASSESSMENT AND PLAN:  Coronary artery disease with stable angina Recent catheterization with chronically occluded RCA collaterals from left to right Continue isosorbide 15 mg  daily  Syncope Periodic episodes typically associated with dehydration, or diarrhea Recent episode unclear, had not drink much fluids that day Taking 3 blood pressure pills in the morning Recommend she decrease the carvedilol down to 3.125 mg twice daily, move the losartan to the evening, increase her fluid intake first thing in the morning -She has declined a ZIO monitor Recommend she check orthostatics at home  Essential hypertension, benign -  Stable, no changes made  Pure hypercholesterolemia Previously took herself off Lipitor Lipitor restarted on prior hospital visit Stay on Lipitor  Ductal carcinoma in situ (DCIS) of left breast Treatment is complete, radiation to the left   Numerous medical records reviewed Total encounter time more than 45 minutes Greater than 50% was spent in counseling and coordination of care with the patient  Disposition: F/U  6 months   No orders of the defined types were placed in this encounter.   Signed, Esmond Plants, M.D., Ph.D. 07/05/2019  [place], [place]

## 2019-07-05 ENCOUNTER — Other Ambulatory Visit: Payer: Self-pay

## 2019-07-05 ENCOUNTER — Encounter: Payer: Self-pay | Admitting: Cardiovascular Disease

## 2019-07-05 ENCOUNTER — Ambulatory Visit (INDEPENDENT_AMBULATORY_CARE_PROVIDER_SITE_OTHER): Payer: Medicare Other | Admitting: Cardiovascular Disease

## 2019-07-05 VITALS — BP 144/70 | HR 60 | Ht 60.0 in | Wt 131.5 lb

## 2019-07-05 DIAGNOSIS — I5022 Chronic systolic (congestive) heart failure: Secondary | ICD-10-CM

## 2019-07-05 DIAGNOSIS — E785 Hyperlipidemia, unspecified: Secondary | ICD-10-CM

## 2019-07-05 DIAGNOSIS — I2510 Atherosclerotic heart disease of native coronary artery without angina pectoris: Secondary | ICD-10-CM

## 2019-07-05 DIAGNOSIS — I25118 Atherosclerotic heart disease of native coronary artery with other forms of angina pectoris: Secondary | ICD-10-CM | POA: Diagnosis not present

## 2019-07-05 DIAGNOSIS — I872 Venous insufficiency (chronic) (peripheral): Secondary | ICD-10-CM | POA: Diagnosis not present

## 2019-07-05 DIAGNOSIS — I10 Essential (primary) hypertension: Secondary | ICD-10-CM

## 2019-07-05 MED ORDER — CARVEDILOL 3.125 MG PO TABS
3.1250 mg | ORAL_TABLET | Freq: Two times a day (BID) | ORAL | 3 refills | Status: DC
Start: 2019-07-05 — End: 2020-04-25

## 2019-07-05 NOTE — Patient Instructions (Addendum)
Call the office if you want a monitor   Medication Instructions:  Your physician has recommended you make the following change in your medication:   1. DECREASE Carvedilol to 3.125 mg twice a day 2. TAKE Losartan and in the evening   Monitor blood pressure when standing  Increase the fluids in the morning   If you need a refill on your cardiac medications before your next appointment, please call your pharmacy.    Lab work: No new labs needed   If you have labs (blood work) drawn today and your tests are completely normal, you will receive your results only by: Marland Kitchen MyChart Message (if you have MyChart) OR . A paper copy in the mail If you have any lab test that is abnormal or we need to change your treatment, we will call you to review the results.   Testing/Procedures: No new testing needed   Follow-Up: At [facility], you and your health needs are our priority.  As part of our continuing mission to provide you with exceptional heart care, we have created designated Provider Care Teams.  These Care Teams include your primary Cardiologist (physician) and Advanced Practice Providers (APPs -  Physician Assistants and Nurse Practitioners) who all work together to provide you with the care you need, when you need it.  . You will need a follow up appointment in 6 months   . Providers on your designated Care Team:   . Murray Hodgkins, NP . Christell Faith, PA-C . Marrianne Mood, PA-C  Any Other Special Instructions Will Be Listed Below (If Applicable).  For educational health videos Log in to : [email or web address] Or : [email or web address], password : [email or web address]   How to Take Your Blood Pressure You can take your blood pressure at home with a machine. You may need to check your blood pressure at home:  To check if you have high blood pressure (hypertension).  To check your blood pressure over time.  To make sure your blood pressure medicine is working. Supplies needed: You will  need a blood pressure machine, or monitor. You can buy one at a drugstore or online. When choosing one:  Choose one with an arm cuff.  Choose one that wraps around your upper arm. Only one finger should fit between your arm and the cuff.  Do not choose one that measures your blood pressure from your wrist or finger. Your doctor can suggest a monitor. How to prepare Avoid these things for 30 minutes before checking your blood pressure:  Drinking caffeine.  Drinking alcohol.  Eating.  Smoking.  Exercising. Five minutes before checking your blood pressure:  Pee.  Sit in a dining chair. Avoid sitting in a soft couch or armchair.  Be quiet. Do not talk. How to take your blood pressure Follow the instructions that came with your machine. If you have a digital blood pressure monitor, these may be the instructions: 1. Sit up straight. 2. Place your feet on the floor. Do not cross your ankles or legs. 3. Rest your left arm at the level of your heart. You may rest it on a table, desk, or chair. 4. Pull up your shirt sleeve. 5. Wrap the blood pressure cuff around the upper part of your left arm. The cuff should be 1 inch (2.5 cm) above your elbow. It is best to wrap the cuff around bare skin. 6. Fit the cuff snugly around your arm. You should be able to place only one finger between the cuff and  your arm. 7. Put the cord inside the groove of your elbow. 8. Press the power button. 9. Sit quietly while the cuff fills with air and loses air. 10. Write down the numbers on the screen. 11. Wait 2-3 minutes and then repeat steps 1-10. What do the numbers mean? Two numbers make up your blood pressure. The first number is called systolic pressure. The second is called diastolic pressure. An example of a blood pressure reading is "120 over 80" (or 120/80). If you are an adult and do not have a medical condition, use this guide to find out if your blood pressure is normal: Normal  First  number: below 120.  Second number: below 80. Elevated  First number: 120-129.  Second number: below 80. Hypertension stage 1  First number: 130-139.  Second number: 80-89. Hypertension stage 2  First number: 140 or above.  Second number: 4 or above. Your blood pressure is above normal even if only the top or bottom number is above normal. Follow these instructions at home:  Check your blood pressure as often as your doctor tells you to.  Take your monitor to your next doctor's appointment. Your doctor will: ? Make sure you are using it correctly. ? Make sure it is working right.  Make sure you understand what your blood pressure numbers should be.  Tell your doctor if your medicines are causing side effects. Contact a doctor if:  Your blood pressure keeps being high. Get help right away if:  Your first blood pressure number is higher than 180.  Your second blood pressure number is higher than 120. This information is not intended to replace advice given to you by your health care provider. Make sure you discuss any questions you have with your health care provider. Document Revised: 02/07/2017 Document Reviewed: 08/04/2015 Elsevier Patient Education  2020 [employer].

## 2019-07-07 ENCOUNTER — Encounter: Payer: Medicare Other | Admitting: *Deleted

## 2019-07-07 ENCOUNTER — Other Ambulatory Visit: Payer: Self-pay

## 2019-07-07 DIAGNOSIS — I252 Old myocardial infarction: Secondary | ICD-10-CM | POA: Diagnosis not present

## 2019-07-07 DIAGNOSIS — I214 Non-ST elevation (NSTEMI) myocardial infarction: Secondary | ICD-10-CM

## 2019-07-07 NOTE — Progress Notes (Signed)
Daily Session Note  Patient Details  Name: Jenna Young MRN: [id number] Date of Birth: 1931-05-06 Referring Provider:     Cardiac Rehab from 06/21/2019 in [facility] Cardiac and Pulmonary Rehab  Referring Provider  Ida Rogue MD      Encounter Date: 07/07/2019  Check In: Session Check In - 07/07/19 1326      Check-In   Supervising physician immediately available to respond to emergencies  See telemetry face sheet for immediately available ER MD    Location  [facility]-Cardiac & Pulmonary Rehab    Staff Present  Renita Papa, RN BSN;Michiah Mudry, RN, BSN, CCRP;Melissa Caiola RDN, LDN;Jessica [place], MA, RCEP, CCRP, CCET    Virtual Visit  No    Medication changes reported      No    Fall or balance concerns reported     No    Warm-up and Cool-down  Performed on first and last piece of equipment    Resistance Training Performed  Yes    VAD Patient?  No    PAD/SET Patient?  No      Pain Assessment   Currently in Pain?  No/denies          Social History   Tobacco Use  Smoking Status Former Smoker  . Quit date: 03/11/1968  . Years since quitting: 51.3  Smokeless Tobacco Never Used    Goals Met:  Independence with exercise equipment Exercise tolerated well No report of cardiac concerns or symptoms  Goals Unmet:  Not Applicable  Comments: Pt able to follow exercise prescription today without complaint.  Will continue to monitor for progression.    Dr. Emily Filbert is Medical Director for [place] and [facility].

## 2019-07-08 ENCOUNTER — Other Ambulatory Visit: Payer: Self-pay

## 2019-07-08 ENCOUNTER — Encounter: Payer: Medicare Other | Admitting: *Deleted

## 2019-07-08 DIAGNOSIS — I214 Non-ST elevation (NSTEMI) myocardial infarction: Secondary | ICD-10-CM

## 2019-07-08 DIAGNOSIS — I252 Old myocardial infarction: Secondary | ICD-10-CM | POA: Diagnosis not present

## 2019-07-08 NOTE — Progress Notes (Signed)
Daily Session Note  Patient Details  Name: Jenna Young MRN: [id number] Date of Birth: 10-27-1931 Referring Provider:     Cardiac Rehab from 06/21/2019 in [facility] Cardiac and Pulmonary Rehab  Referring Provider  Ida Rogue MD      Encounter Date: 07/08/2019  Check In: Session Check In - 07/08/19 1259      Check-In   Supervising physician immediately available to respond to emergencies  See telemetry face sheet for immediately available ER MD    Location  [facility]Cardiac [facility] Pulmonary Rehab    Staff Present  Renita Papa, RN BSN;Joseph [address] [place], [place], [place], CCRP, CCET    Virtual Visit  No    Medication changes reported      No    Fall or balance concerns reported     No    Warm-up and Cool-down  Performed on first and last piece of equipment    Resistance Training Performed  Yes    VAD Patient?  No    PAD/SET Patient?  No      Pain Assessment   Currently in Pain?  No/denies          Social History   Tobacco Use  Smoking Status Former Smoker  . Quit date: 03/11/1968  . Years since quitting: 51.3  Smokeless Tobacco Never Used    Goals Met:  Independence with exercise equipment Exercise tolerated well No report of cardiac concerns or symptoms Strength training completed today  Goals Unmet:  Not Applicable  Comments: Pt able to follow exercise prescription today without complaint.  Will continue to monitor for progression.  Reviewed home exercise with pt today.  Pt plans to walking and staff videos for exercise.  Reviewed THR, pulse, RPE, sign and symptoms, NTG use, and when to call 911 or MD.  Also discussed weather considerations and indoor options.  Pt voiced understanding.   Dr. Emily Filbert is Medical Director for [place] and [facility].

## 2019-07-10 NOTE — Progress Notes (Signed)
[place]  [phone] [phone]  ID: Shireen Doty OB: [birth year]  MR#: [id number]  [id number]  Patient Care Team: Abner Greenspan, MD as PCP - General Rockey Situ Kathlene November, MD as PCP - Cardiology (Cardiology) Lloyd Huger, MD as Consulting Physician (Oncology)  CHIEF COMPLAINT: Iron deficiency anemia.  INTERVAL HISTORY: Patient returns to clinic today for repeat laboratory work and further evaluation.  She feels improved since receiving IV Feraheme several months ago.  She does not complain of any weakness or fatigue.  She currently feels well and is asymptomatic. She has no neurologic complaints.  She denies any recent fevers or illnesses. She has no chest pain, shortness of breath, cough, or hemoptysis.  She denies any nausea, vomiting, constipation, or diarrhea.  She has no melena or hematochezia.  She has no urinary complaints.  Patient offers no specific complaints today.  REVIEW OF SYSTEMS:   Review of Systems  Constitutional: Negative.  Negative for fever, malaise/fatigue and weight loss.  Respiratory: Negative.  Negative for cough and shortness of breath.   Cardiovascular: Negative.  Negative for chest pain and leg swelling.  Gastrointestinal: Negative.  Negative for abdominal pain, blood in stool and melena.  Genitourinary: Negative.  Negative for dysuria and hematuria.  Musculoskeletal: Negative.  Negative for back pain and myalgias.  Skin: Negative.  Negative for rash.  Neurological: Negative.  Negative for dizziness, focal weakness, weakness and headaches.  Psychiatric/Behavioral: Negative.  The patient is not nervous/anxious.     As per HPI. Otherwise, a complete review of systems is negative.  PAST MEDICAL HISTORY: Past Medical History:  Diagnosis Date  . Allergic rhinitis   . Anemia   . Anxiety   . GERD (gastroesophageal reflux disease)   . History of radiation therapy 04/29/16- 05/24/16   Left Breast 40.05  Gy in 15 fractions, Left Breast boost 10 Gy in 5 fractions.   . Hyperlipidemia   . Hypertension    treated by Dr Thurnell Garbe recently with syncope episode  . Osteopenia   . Osteoporosis   . Personal history of radiation therapy 2018    PAST SURGICAL HISTORY: Past Surgical History:  Procedure Laterality Date  . APPENDECTOMY    . BREAST BIOPSY Left 11/29/2008  . BREAST BIOPSY Left 01/26/2016   malignant  . BREAST EXCISIONAL BIOPSY Left 12/26/2008  . BREAST LUMPECTOMY Left 02/27/2016  . BREAST LUMPECTOMY Left 04/01/2016  . BREAST LUMPECTOMY WITH RADIOACTIVE SEED LOCALIZATION Left 02/27/2016   Procedure: LEFT BREAST LUMPECTOMY WITH RADIOACTIVE SEED LOCALIZATION;  Surgeon: Autumn Messing III, MD;  Location: [place];  Service: General;  Laterality: Left;  . BREAST SURGERY  10/10   benign biopsy  negative  . CESAREAN SECTION    . CHOLECYSTECTOMY    . CORONARY STENT INTERVENTION N/A 02/11/2017   Procedure: CORONARY STENT INTERVENTION;  Surgeon: Nelva Bush, MD;  Location: [place] [facility];  Service: Cardiovascular;  Laterality: N/A;  . FEMUR FRACTURE SURGERY Right 2006  . LEFT HEART CATH AND CORONARY ANGIOGRAPHY N/A 02/11/2017   Procedure: LEFT HEART CATH AND CORONARY ANGIOGRAPHY;  Surgeon: Nelva Bush, MD;  Location: [place] [facility];  Service: Cardiovascular;  Laterality: N/A;  . LEFT HEART CATH AND CORONARY ANGIOGRAPHY N/A 05/21/2019   Procedure: LEFT HEART CATH AND CORONARY ANGIOGRAPHY;  Surgeon: Wellington Hampshire, MD;  Location: [place] [facility];  Service: Cardiovascular;  Laterality: N/A;  . OVARIAN CYST SURGERY    . RE-EXCISION OF BREAST LUMPECTOMY Left 04/01/2016  Procedure: RE-EXCISION OF LEFT BREAST MEDIAL MARGIN;  Surgeon: Autumn Messing III, MD;  Location: [place];  Service: General;  Laterality: Left;    FAMILY HISTORY: Family History  Problem Relation Age of Onset  . Hypertension Mother   . Heart disease Father   . Macular  degeneration Sister   . Diabetes Maternal Aunt   . Cancer Maternal Aunt        throat    ADVANCED DIRECTIVES (Y/N):  N  HEALTH MAINTENANCE: Social History   Tobacco Use  . Smoking status: Former Smoker    Quit date: 03/11/1968    Years since quitting: 51.3  . Smokeless tobacco: Never Used  Substance Use Topics  . Alcohol use: No    Alcohol/week: 0.0 standard drinks  . Drug use: No     Colonoscopy:  PAP:  Bone density:  Lipid panel:  Allergies  Allergen Reactions  . Buspirone Hcl     REACTION: HEART PALPITATIONS  . Codeine     REACTION: nausea and vomiting  . Hydrochlorothiazide     REACTION: syncope-possibly from dehydration  . Iron     Oral iron -per pt made her "pass out"   . Lansoprazole     REACTION: abd pain  . Minocycline Hcl   . Penicillins     REACTION: mouth numbness Has patient had a PCN reaction causing immediate rash, facial/tongue/throat swelling, SOB or lightheadedness with hypotension: No Has patient had a PCN reaction causing severe rash involving mucus membranes or skin necrosis: No Has patient had a PCN reaction that required hospitalization: No Has patient had a PCN reaction occurring within the last 10 years: No If all of the above answers are "NO", then may proceed with Cephalosporin use.  Marland Kitchen Phenergan [Promethazine Hcl] Other (See Comments)    Body shakes, hallucinations  . Ramipril     Cough   . Ranexa [Ranolazine]     Constipation   . Sulfa Antibiotics Nausea Only    Current Outpatient Medications  Medication Sig Dispense Refill  . aspirin (ASPIRIN EC) 81 MG EC tablet Take 81 mg by mouth daily. Swallow whole.     Marland Kitchen atorvastatin (LIPITOR) 40 MG tablet Take 1 tablet (40 mg total) by mouth daily at 6 PM. 90 tablet 0  . carvedilol (COREG) 3.125 MG tablet Take 1 tablet (3.125 mg total) by mouth 2 (two) times daily. 180 tablet 3  . Cholecalciferol (VITAMIN D) 2000 UNITS CAPS Take 1 capsule by mouth daily.      . clopidogrel (PLAVIX) 75 MG  tablet Take 1 tablet (75 mg total) by mouth daily with breakfast. 90 tablet 3  . cyanocobalamin (,VITAMIN B-12,) 1000 MCG/ML injection Inject 1,000 mcg into the muscle every 3 (three) months.    . isosorbide mononitrate (IMDUR) 30 MG 24 hr tablet TAKE ONE-HALF TABLET BY MOUTH EVERY DAY 15 tablet 3  . losartan (COZAAR) 25 MG tablet Take 1 tablet (25 mg total) by mouth daily. 90 tablet 0  . nitroGLYCERIN (NITROSTAT) 0.4 MG SL tablet PLACE 1 TABLET UNDER TONGUE EVERY 5 MIN AS NEEDED FOR CHEST PAIN IF NO RELIEF IN15 MIN CALL 911 (MAX 3 TABS) 25 tablet 1  . omeprazole (PRILOSEC) 20 MG capsule Take 1 capsule (20 mg total) by mouth daily as needed. 90 capsule 3  . terconazole (TERAZOL 7) 0.4 % vaginal cream Apply to affected areas of vulva daily for itching as needed 45 g 0   No current facility-administered medications for this visit.  OBJECTIVE: Vitals:   07/16/19 1327  BP: (!) 156/65  Pulse: 64  Resp: 16  Temp: (!) 96.4 F (35.8 C)     Body mass index is 25.7 kg/m.    ECOG FS:0 - Asymptomatic  General: Well-developed, well-nourished, no acute distress. Eyes: Pink conjunctiva, anicteric sclera. HEENT: Normocephalic, moist mucous membranes. Lungs: No audible wheezing or coughing. Heart: Regular rate and rhythm. Abdomen: Soft, nontender, no obvious distention. Musculoskeletal: No edema, cyanosis, or clubbing. Neuro: Alert, answering all questions appropriately. Cranial nerves grossly intact. Skin: No rashes or petechiae noted. Psych: Normal affect.   LAB RESULTS:  Lab Results  Component Value Date   NA 136 05/21/2019   K 4.7 05/21/2019   CL 105 05/21/2019   CO2 23 05/21/2019   GLUCOSE 111 (H) 05/21/2019   BUN 6 (L) 05/21/2019   CREATININE 0.81 05/21/2019   CALCIUM 8.9 05/21/2019   PROT 8.8 (H) 05/21/2019   ALBUMIN 4.2 05/21/2019   AST 26 05/21/2019   ALT 15 05/21/2019   ALKPHOS 73 05/21/2019   BILITOT 0.8 05/21/2019   GFRNONAA >60 05/21/2019   GFRAA >60 05/21/2019     Lab Results  Component Value Date   WBC 5.4 07/16/2019   NEUTROABS 2.6 07/16/2019   HGB 11.6 (L) 07/16/2019   HCT 35.8 (L) 07/16/2019   MCV 81.5 07/16/2019   PLT 213 07/16/2019   Lab Results  Component Value Date   IRON 33 05/12/2019   TIBC 504 (H) 05/12/2019   IRONPCTSAT 7 (L) 05/12/2019   Lab Results  Component Value Date   FERRITIN 6 (L) 05/12/2019    STUDIES: No results found.  ASSESSMENT: Iron deficiency anemia.  PLAN:   1.  Iron deficiency anemia: Patient's hemoglobin has significantly improved and is now 11.6.  Iron stores are pending at time of dictation.  Previously, all of her other laboratory work was either negative or within normal limits.  She does not require additional IV Feraheme today.  Patient last received treatment on May 26, 2019.  Return to clinic in 3 months with repeat laboratory work, further evaluation, and continuation of treatment if needed. 2.  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  Final pathology was noninvasive therefore she did not require chemotherapy.  Tamoxifen also would not be of benefit given the ER/PR status of her tumor.  Her most recent mammogram in June 2020 was reported as BI-RADS 2.  Patient has requested that her primary care physician continue to order to monitor her mammograms. 3.  B12 deficiency: Continue monthly B12 injections with primary care as needed. 4.  Cardiac disease: Patient had NSTEMI in March 2021 and was taken for cardiac catheterization.  Continue follow-up with cardiology and primary care as scheduled.   Patient expressed understanding and was in agreement with this plan. She also understands that She can call clinic at any time with any questions, concerns, or complaints.   Cancer Staging History of breast cancer Staging form: Breast, AJCC 7th Edition - Clinical stage from 01/26/2016: Stage 0 (Tis (DCIS), N0, M0) -  Signed by Truitt Merle, MD on 02/26/2016 - Pathologic: Stage 0 (Tis (DCIS), N0, cM0) - Unsigned   Lloyd Huger, MD   07/16/2019 1:47 PM

## 2019-07-12 ENCOUNTER — Other Ambulatory Visit: Payer: Self-pay

## 2019-07-12 ENCOUNTER — Encounter: Payer: Medicare Other | Attending: Cardiovascular Disease | Admitting: *Deleted

## 2019-07-12 DIAGNOSIS — I214 Non-ST elevation (NSTEMI) myocardial infarction: Secondary | ICD-10-CM

## 2019-07-12 DIAGNOSIS — I252 Old myocardial infarction: Secondary | ICD-10-CM | POA: Insufficient documentation

## 2019-07-12 NOTE — Progress Notes (Signed)
Daily Session Note  Patient Details  Name: Jenna Young MRN: [id number] Date of Birth: [birth year] Referring Provider:     Cardiac Rehab from 06/21/2019 in [facility] Cardiac and Pulmonary Rehab  Referring Provider  Ida Rogue MD      Encounter Date: 07/12/2019  Check In: Session Check In - 07/12/19 1303      Check-In   Supervising physician immediately available to respond to emergencies  See telemetry face sheet for immediately available ER MD    Location  [facility]    Staff Present  Renita Papa, RN BSN;Joseph Hood RCP,RRT,BSRT;Melissa [place] RDN, LDN    Virtual Visit  No    Medication changes reported      No    Fall or balance concerns reported     No    Warm-up and Cool-down  Performed on first and last piece of equipment    Resistance Training Performed  Yes    VAD Patient?  No    PAD/SET Patient?  No      Pain Assessment   Currently in Pain?  No/denies          Social History   Tobacco Use  Smoking Status Former Smoker  . Quit date: 03/11/1968  . Years since quitting: 51.3  Smokeless Tobacco Never Used    Goals Met:  Independence with exercise equipment Exercise tolerated well No report of cardiac concerns or symptoms Strength training completed today  Goals Unmet:  Not Applicable  Comments: Pt able to follow exercise prescription today without complaint.  Will continue to monitor for progression.    Dr. Emily Filbert is Medical Director for [place] and [facility].

## 2019-07-14 ENCOUNTER — Encounter: Payer: Medicare Other | Admitting: *Deleted

## 2019-07-14 ENCOUNTER — Other Ambulatory Visit: Payer: Self-pay

## 2019-07-14 DIAGNOSIS — I252 Old myocardial infarction: Secondary | ICD-10-CM | POA: Diagnosis not present

## 2019-07-14 DIAGNOSIS — I214 Non-ST elevation (NSTEMI) myocardial infarction: Secondary | ICD-10-CM

## 2019-07-14 NOTE — Progress Notes (Signed)
Daily Session Note  Patient Details  Name: Jenna Young MRN: [id number] Date of Birth: 1931-10-19 Referring Provider:     Cardiac Rehab from 06/21/2019 in [facility] Cardiac and Pulmonary Rehab  Referring Provider  Ida Rogue MD      Encounter Date: 07/14/2019  Check In: Session Check In - 07/14/19 1313      Check-In   Supervising physician immediately available to respond to emergencies  See telemetry face sheet for immediately available ER MD    Location  [facility]    Staff Present  Renita Papa, RN BSN;Melissa Caiola RDN, LDN;Jessica Luan Pulling, MA, RCEP, CCRP, CCET;Joseph Hood RCP,RRT,BSRT    Virtual Visit  No    Medication changes reported      No    Fall or balance concerns reported     No    Warm-up and Cool-down  Performed on first and last piece of equipment    Resistance Training Performed  Yes    VAD Patient?  No    PAD/SET Patient?  No      Pain Assessment   Currently in Pain?  No/denies          Social History   Tobacco Use  Smoking Status Former Smoker  . Quit date: 03/11/1968  . Years since quitting: 51.3  Smokeless Tobacco Never Used    Goals Met:  Independence with exercise equipment Exercise tolerated well No report of cardiac concerns or symptoms Strength training completed today  Goals Unmet:  Not Applicable  Comments: Pt able to follow exercise prescription today without complaint.  Will continue to monitor for progression.    Dr. Emily Filbert is Medical Director for [place] and [facility].

## 2019-07-15 ENCOUNTER — Encounter: Payer: Medicare Other | Admitting: *Deleted

## 2019-07-15 ENCOUNTER — Other Ambulatory Visit: Payer: Self-pay

## 2019-07-15 DIAGNOSIS — I252 Old myocardial infarction: Secondary | ICD-10-CM | POA: Diagnosis not present

## 2019-07-15 DIAGNOSIS — I214 Non-ST elevation (NSTEMI) myocardial infarction: Secondary | ICD-10-CM

## 2019-07-15 NOTE — Progress Notes (Signed)
Daily Session Note  Patient Details  Name: Jenna Young MRN: [id number] Date of Birth: Oct 15, 1931 Referring Provider:     Cardiac Rehab from 06/21/2019 in [facility] Cardiac and Pulmonary Rehab  Referring Provider  Ida Rogue MD      Encounter Date: 07/15/2019  Check In: Session Check In - 07/15/19 1322      Check-In   Supervising physician immediately available to respond to emergencies  See telemetry face sheet for immediately available ER MD    Location  [facility]Cardiac [facility] Pulmonary Rehab    Staff Present  Renita Papa, RN BSN;Jessica Ursa, MA, RCEP, CCRP, CCET;Amanda Sommer, [place], ACSM CEP, Exercise Physiologist;Joseph Hood RCP,RRT,BSRT    Virtual Visit  No    Medication changes reported      No    Fall or balance concerns reported     No    Warm-up and Cool-down  Performed on first and last piece of equipment    Resistance Training Performed  Yes    VAD Patient?  No    PAD/SET Patient?  No      Pain Assessment   Currently in Pain?  No/denies          Social History   Tobacco Use  Smoking Status Former Smoker  . Quit date: 03/11/1968  . Years since quitting: 51.3  Smokeless Tobacco Never Used    Goals Met:  Independence with exercise equipment Exercise tolerated well No report of cardiac concerns or symptoms Strength training completed today  Goals Unmet:  Not Applicable  Comments: Pt able to follow exercise prescription today without complaint.  Will continue to monitor for progression.'    Dr. Emily Filbert is Medical Director for [place] and [facility].

## 2019-07-16 ENCOUNTER — Other Ambulatory Visit: Payer: Self-pay | Admitting: *Deleted

## 2019-07-16 ENCOUNTER — Inpatient Hospital Stay (HOSPITAL_BASED_OUTPATIENT_CLINIC_OR_DEPARTMENT_OTHER): Payer: Medicare Other | Admitting: Oncology

## 2019-07-16 ENCOUNTER — Inpatient Hospital Stay: Payer: Medicare Other | Attending: Oncology

## 2019-07-16 ENCOUNTER — Encounter: Payer: Self-pay | Admitting: Oncology

## 2019-07-16 ENCOUNTER — Inpatient Hospital Stay: Payer: Medicare Other

## 2019-07-16 VITALS — BP 156/65 | HR 64 | Temp 96.4°F | Resp 16 | Wt 131.6 lb

## 2019-07-16 DIAGNOSIS — Z833 Family history of diabetes mellitus: Secondary | ICD-10-CM | POA: Insufficient documentation

## 2019-07-16 DIAGNOSIS — D509 Iron deficiency anemia, unspecified: Secondary | ICD-10-CM

## 2019-07-16 DIAGNOSIS — I252 Old myocardial infarction: Secondary | ICD-10-CM | POA: Insufficient documentation

## 2019-07-16 DIAGNOSIS — D649 Anemia, unspecified: Secondary | ICD-10-CM

## 2019-07-16 DIAGNOSIS — Z86000 Personal history of in-situ neoplasm of breast: Secondary | ICD-10-CM | POA: Insufficient documentation

## 2019-07-16 DIAGNOSIS — Z8249 Family history of ischemic heart disease and other diseases of the circulatory system: Secondary | ICD-10-CM | POA: Insufficient documentation

## 2019-07-16 DIAGNOSIS — I2510 Atherosclerotic heart disease of native coronary artery without angina pectoris: Secondary | ICD-10-CM

## 2019-07-16 DIAGNOSIS — Z87891 Personal history of nicotine dependence: Secondary | ICD-10-CM | POA: Insufficient documentation

## 2019-07-16 DIAGNOSIS — E538 Deficiency of other specified B group vitamins: Secondary | ICD-10-CM | POA: Diagnosis not present

## 2019-07-16 LAB — CBC WITH DIFFERENTIAL/PLATELET
Abs Immature Granulocytes: 0.01 10*3/uL (ref 0.00–0.07)
Basophils Absolute: 0 10*3/uL (ref 0.0–0.1)
Basophils Relative: 1 %
Eosinophils Absolute: 0.1 10*3/uL (ref 0.0–0.5)
Eosinophils Relative: 2 %
HCT: 35.8 % — ABNORMAL LOW (ref 36.0–46.0)
Hemoglobin: 11.6 g/dL — ABNORMAL LOW (ref 12.0–15.0)
Immature Granulocytes: 0 %
Lymphocytes Relative: 39 %
Lymphs Abs: 2.1 10*3/uL (ref 0.7–4.0)
MCH: 26.4 pg (ref 26.0–34.0)
MCHC: 32.4 g/dL (ref 30.0–36.0)
MCV: 81.5 fL (ref 80.0–100.0)
Monocytes Absolute: 0.5 10*3/uL (ref 0.1–1.0)
Monocytes Relative: 9 %
Neutro Abs: 2.6 10*3/uL (ref 1.7–7.7)
Neutrophils Relative %: 49 %
Platelets: 213 10*3/uL (ref 150–400)
RBC: 4.39 MIL/uL (ref 3.87–5.11)
RDW: 22.8 % — ABNORMAL HIGH (ref 11.5–15.5)
WBC: 5.4 10*3/uL (ref 4.0–10.5)
nRBC: 0 % (ref 0.0–0.2)

## 2019-07-16 LAB — IRON AND TIBC
Iron: 78 ug/dL (ref 28–170)
Saturation Ratios: 30 % (ref 10.4–31.8)
TIBC: 258 ug/dL (ref 250–450)
UIBC: 180 ug/dL

## 2019-07-16 LAB — FERRITIN: Ferritin: 137 ng/mL (ref 11–307)

## 2019-07-16 NOTE — Progress Notes (Signed)
Pt in for follow up, states was in hospital March 13 with a heart attack.  Pt is currently going to cardiac rehab.

## 2019-07-19 ENCOUNTER — Other Ambulatory Visit: Payer: Self-pay

## 2019-07-19 ENCOUNTER — Other Ambulatory Visit: Payer: Self-pay | Admitting: Cardiovascular Disease

## 2019-07-19 ENCOUNTER — Encounter: Payer: Medicare Other | Admitting: *Deleted

## 2019-07-19 DIAGNOSIS — I252 Old myocardial infarction: Secondary | ICD-10-CM | POA: Diagnosis not present

## 2019-07-19 DIAGNOSIS — I214 Non-ST elevation (NSTEMI) myocardial infarction: Secondary | ICD-10-CM

## 2019-07-19 MED ORDER — ISOSORBIDE MONONITRATE ER 30 MG PO TB24: 15.0000 mg | ORAL_TABLET | Freq: Every day | ORAL | 3 refills | Status: DC

## 2019-07-19 NOTE — Telephone Encounter (Signed)
*  STAT* If patient is at the pharmacy, call can be transferred to refill team.   1. Which medications need to be refilled? (please list name of each medication and dose if known) isosorbide mononitrate 30 (1/2 tablet daily)  2. Which pharmacy/location (including street and city if local pharmacy) is medication to be sent to? [facility] by [address] and [address]  3. Do they need a 30 day or 90 day supply? 90  Patient hasnt had any for 3 days, pharmacy told her they didn't have it for her

## 2019-07-19 NOTE — Telephone Encounter (Signed)
Requested Prescriptions   Signed Prescriptions Disp Refills  . isosorbide mononitrate (IMDUR) 30 MG 24 hr tablet 45 tablet 3    Sig: Take 0.5 tablets (15 mg total) by mouth daily.    Authorizing Provider: Minna Merritts    Ordering User: Britt Bottom

## 2019-07-19 NOTE — Progress Notes (Signed)
Daily Session Note  Patient Details  Name: Jenna Young MRN: [id number] Date of Birth: [birth year] Referring Provider:     Cardiac Rehab from 06/21/2019 in [facility] Cardiac and Pulmonary Rehab  Referring Provider  Ida Rogue MD      Encounter Date: 07/19/2019  Check In: Session Check In - 07/19/19 1310      Check-In   Supervising physician immediately available to respond to emergencies  See telemetry face sheet for immediately available ER MD    Location  [facility] Re[facility]    Staff Present  Renita Papa, RN BSN;Melissa Caiola RDN, LDN;Jessica Luan Pulling, MA, RCEP, CCRP, CCET    Virtual Visit  No    Medication changes reported      No    Fall or balance concerns reported     No    Warm-up and Cool-down  Performed on first and last piece of equipment    Resistance Training Performed  Yes    VAD Patient?  No    PAD/SET Patient?  No      Pain Assessment   Currently in Pain?  No/denies          Social History   Tobacco Use  Smoking Status Former Smoker  . Quit date: 03/11/1968  . Years since quitting: 51.3  Smokeless Tobacco Never Used    Goals Met:  Independence with exercise equipment Exercise tolerated well No report of cardiac concerns or symptoms Strength training completed today  Goals Unmet:  Not Applicable  Comments: Pt able to follow exercise prescription today without complaint.  Will continue to monitor for progression.    Dr. Emily Filbert is Medical Director for [place] and [facility].

## 2019-07-21 ENCOUNTER — Ambulatory Visit (INDEPENDENT_AMBULATORY_CARE_PROVIDER_SITE_OTHER): Payer: Medicare Other

## 2019-07-21 ENCOUNTER — Encounter: Payer: Medicare Other | Admitting: *Deleted

## 2019-07-21 ENCOUNTER — Other Ambulatory Visit: Payer: Self-pay

## 2019-07-21 DIAGNOSIS — I214 Non-ST elevation (NSTEMI) myocardial infarction: Secondary | ICD-10-CM

## 2019-07-21 DIAGNOSIS — I252 Old myocardial infarction: Secondary | ICD-10-CM | POA: Diagnosis not present

## 2019-07-21 DIAGNOSIS — E538 Deficiency of other specified B group vitamins: Secondary | ICD-10-CM

## 2019-07-21 MED ORDER — CYANOCOBALAMIN 1000 MCG/ML IJ SOLN
1000.0000 ug | Freq: Once | INTRAMUSCULAR | Status: AC
  Administered 2019-07-21: 1000 ug via INTRAMUSCULAR

## 2019-07-21 NOTE — Progress Notes (Signed)
Per orders of Dr. Tower, injection of B12 ° given by Janele Lague. °Patient tolerated injection well. ° °

## 2019-07-21 NOTE — Progress Notes (Signed)
Daily Session Note  Patient Details  Name: Jenna Young MRN: [id number] Date of Birth: 02/19/1932 Referring Provider:     Cardiac Rehab from 06/21/2019 in [facility] Cardiac and Pulmonary Rehab  Referring Provider  Gollan, Timothy MD      Encounter Date: 07/21/2019  Check In: Session Check In - 07/21/19 1305      Check-In   Supervising physician immediately available to respond to emergencies  See telemetry face sheet for immediately available ER MD    Location  [facility]    Staff Present  Meredith Craven, RN BSN;Jessica Hawkins, MA, RCEP, CCRP, CCET;Melissa Caiola RDN, LDN    Virtual Visit  No    Medication changes reported      Yes    Fall or balance concerns reported     No    Warm-up and Cool-down  Performed on first and last piece of equipment    Resistance Training Performed  Yes    VAD Patient?  No    PAD/SET Patient?  No      Pain Assessment   Currently in Pain?  No/denies          Social History   Tobacco Use  Smoking Status Former Smoker  . Quit date: 03/11/1968  . Years since quitting: 51.3  Smokeless Tobacco Never Used    Goals Met:  Independence with exercise equipment Exercise tolerated well No report of cardiac concerns or symptoms Strength training completed today  Goals Unmet:  Not Applicable  Comments: Pt able to follow exercise prescription today without complaint.  Will continue to monitor for progression.    Dr. Mark Miller is Medical Director for [facility] and [facility]. 

## 2019-07-22 ENCOUNTER — Other Ambulatory Visit: Payer: Self-pay

## 2019-07-22 ENCOUNTER — Encounter: Payer: Medicare Other | Admitting: *Deleted

## 2019-07-22 DIAGNOSIS — I214 Non-ST elevation (NSTEMI) myocardial infarction: Secondary | ICD-10-CM

## 2019-07-22 DIAGNOSIS — I252 Old myocardial infarction: Secondary | ICD-10-CM | POA: Diagnosis not present

## 2019-07-22 NOTE — Progress Notes (Signed)
Daily Session Note  Patient Details  Name: Jenna Young MRN: [id number] Date of Birth: 07/30/1931 Referring Provider:     Cardiac Rehab from 06/21/2019 in [facility] Cardiac and Pulmonary Rehab  Referring Provider  Ida Rogue MD      Encounter Date: 07/22/2019  Check In: Session Check In - 07/22/19 1309      Check-In   Supervising physician immediately available to respond to emergencies  See telemetry face sheet for immediately available ER MD    Location  [facility]    Staff Present  Alberteen Sam, MA, RCEP, CCRP, CCET;Joseph Alcus Dad, RN BSN    Virtual Visit  No    Medication changes reported      No    Fall or balance concerns reported     No    Warm-up and Cool-down  Performed on first and last piece of equipment    Resistance Training Performed  Yes    VAD Patient?  No    PAD/SET Patient?  No      Pain Assessment   Currently in Pain?  No/denies          Social History   Tobacco Use  Smoking Status Former Smoker  . Quit date: 03/11/1968  . Years since quitting: 51.3  Smokeless Tobacco Never Used    Goals Met:  Independence with exercise equipment Exercise tolerated well No report of cardiac concerns or symptoms Strength training completed today  Goals Unmet:  Not Applicable  Comments: Pt able to follow exercise prescription today without complaint.  Will continue to monitor for progression.    Dr. Emily Filbert is Medical Director for [place] and [facility].

## 2019-07-28 ENCOUNTER — Encounter: Payer: Medicare Other | Admitting: *Deleted

## 2019-07-28 ENCOUNTER — Other Ambulatory Visit: Payer: Self-pay

## 2019-07-28 ENCOUNTER — Encounter: Payer: Self-pay | Admitting: *Deleted

## 2019-07-28 DIAGNOSIS — I214 Non-ST elevation (NSTEMI) myocardial infarction: Secondary | ICD-10-CM

## 2019-07-28 DIAGNOSIS — I252 Old myocardial infarction: Secondary | ICD-10-CM | POA: Diagnosis not present

## 2019-07-28 NOTE — Progress Notes (Signed)
Daily Session Note  Patient Details  Name: Jenna Young MRN: [id number] Date of Birth: 05/30/1931 Referring Provider:     Cardiac Rehab from 06/21/2019 in [facility]  Referring Provider  Gollan, Timothy MD      Encounter Date: 07/28/2019  Check In: Session Check In - 07/28/19 1304      Check-In   Supervising physician immediately available to respond to emergencies  See telemetry face sheet for immediately available ER MD    Location  [facility]    Staff Present  Meredith Craven, RN BSN;Jessica Hawkins, MA, RCEP, CCRP, CCET;Melissa Caiola RDN, LDN    Virtual Visit  No    Medication changes reported      No    Fall or balance concerns reported     No    Warm-up and Cool-down  Performed on first and last piece of equipment    Resistance Training Performed  Yes    VAD Patient?  No    PAD/SET Patient?  No      Pain Assessment   Currently in Pain?  No/denies          Social History   Tobacco Use  Smoking Status Former Smoker  . Quit date: 03/11/1968  . Years since quitting: 51.4  Smokeless Tobacco Never Used    Goals Met:  Independence with exercise equipment Exercise tolerated well No report of cardiac concerns or symptoms Strength training completed today  Goals Unmet:  Not Applicable  Comments: Pt able to follow exercise prescription today without complaint.  Will continue to monitor for progression.    Dr. Mark Miller is Medical Director for [facility] and [facility]. 

## 2019-07-28 NOTE — Progress Notes (Signed)
Cardiac Individual Treatment Plan  Patient Details  Name: Jenna Young MRN: [id number] Date of Birth: 1932-02-25 Referring Provider:     Cardiac Rehab from 06/21/2019 in [facility] Cardiac and Pulmonary Rehab  Referring Provider  Ida Rogue MD      Initial Encounter Date:    Cardiac Rehab from 06/21/2019 in [facility] Cardiac and Pulmonary Rehab  Date  06/21/19      Visit Diagnosis: NSTEMI (non-ST elevated myocardial infarction) [facility])  Patient's Home Medications on Admission:  Current Outpatient Medications:  .  aspirin (ASPIRIN EC) 81 MG EC tablet, Take 81 mg by mouth daily. Swallow whole. , Disp: , Rfl:  .  atorvastatin (LIPITOR) 40 MG tablet, Take 1 tablet (40 mg total) by mouth daily at 6 PM., Disp: 90 tablet, Rfl: 0 .  carvedilol (COREG) 3.125 MG tablet, Take 1 tablet (3.125 mg total) by mouth 2 (two) times daily., Disp: 180 tablet, Rfl: 3 .  Cholecalciferol (VITAMIN D) 2000 UNITS CAPS, Take 1 capsule by mouth daily.  , Disp: , Rfl:  .  clopidogrel (PLAVIX) 75 MG tablet, Take 1 tablet (75 mg total) by mouth daily with breakfast., Disp: 90 tablet, Rfl: 3 .  cyanocobalamin (,VITAMIN B-12,) 1000 MCG/ML injection, Inject 1,000 mcg into the muscle every 3 (three) months., Disp: , Rfl:  .  isosorbide mononitrate (IMDUR) 30 MG 24 hr tablet, Take 0.5 tablets (15 mg total) by mouth daily., Disp: 45 tablet, Rfl: 3 .  losartan (COZAAR) 25 MG tablet, Take 1 tablet (25 mg total) by mouth daily., Disp: 90 tablet, Rfl: 0 .  nitroGLYCERIN (NITROSTAT) 0.4 MG SL tablet, PLACE 1 TABLET UNDER TONGUE EVERY 5 MIN AS NEEDED FOR CHEST PAIN IF NO RELIEF IN15 MIN CALL 911 (MAX 3 TABS), Disp: 25 tablet, Rfl: 1 .  omeprazole (PRILOSEC) 20 MG capsule, Take 1 capsule (20 mg total) by mouth daily as needed., Disp: 90 capsule, Rfl: 3 .  terconazole (TERAZOL 7) 0.4 % vaginal cream, Apply to affected areas of vulva daily for itching as needed, Disp: 45 g, Rfl: 0  Past Medical History: Past Medical History:   Diagnosis Date  . Allergic rhinitis   . Anemia   . Anxiety   . GERD (gastroesophageal reflux disease)   . History of radiation therapy 04/29/16- 05/24/16   Left Breast 40.05 Gy in 15 fractions, Left Breast boost 10 Gy in 5 fractions.   . Hyperlipidemia   . Hypertension    treated by Dr Thurnell Garbe recently with syncope episode  . Osteopenia   . Osteoporosis   . Personal history of radiation therapy 2018    Tobacco Use: Social History   Tobacco Use  Smoking Status Former Smoker  . Quit date: 03/11/1968  . Years since quitting: 51.4  Smokeless Tobacco Never Used    Labs: Recent Chemical engineer    Labs for ITP Cardiac and Pulmonary Rehab Latest Ref Rng & Units 02/11/2017 03/31/2017 10/17/2017 04/08/2018 04/14/2019   Cholestrol 0 - 200 mg/dL 173 125 121 140 200   LDLCALC 0 - 99 mg/dL 100(H) 56 51 66 121(H)   LDLDIRECT mg/dL - - - - -   HDL >39.00 mg/dL 53 50.50 53 54.00 55.50   Trlycerides 0.0 - 149.0 mg/dL 98 92.0 83 101.0 117.0   Hemoglobin A1c 4.6 - 6.5 % - - - - 6.2   TCO2 0 - 100 mmol/L - - - - -       Exercise Target Goals: Exercise Program Goal: Individual exercise prescription  set using results from initial 6 min walk test and THRR while considering  patient's activity barriers and safety.   Exercise Prescription Goal: Initial exercise prescription builds to 30-45 minutes a day of aerobic activity, 2-3 days per week.  Home exercise guidelines will be given to patient during program as part of exercise prescription that the participant will acknowledge.   Education: Aerobic Exercise & Resistance Training: - Gives group verbal and written instruction on the various components of exercise. Focuses on aerobic and resistive training programs and the benefits of this training and how to safely progress through these programs..   Cardiac Rehab from 05/15/2017 in [facility] Cardiac and Pulmonary Rehab  Date  03/13/17  Educator  [facility]  Instruction Review Code  1- Verbalizes  Understanding      Education: Exercise & Equipment Safety: - Individual verbal instruction and demonstration of equipment use and safety with use of the equipment.   Cardiac Rehab from 06/21/2019 in [facility] Cardiac and Pulmonary Rehab  Date  06/21/19  Educator  [facility]  Instruction Review Code  1- Verbalizes Understanding      Education: Exercise Physiology & General Exercise Guidelines: - Group verbal and written instruction with models to review the exercise physiology of the cardiovascular system and associated critical values. Provides general exercise guidelines with specific guidelines to those with heart or lung disease.    Cardiac Rehab from 05/15/2017 in [facility] Cardiac and Pulmonary Rehab  Date  05/01/17  Educator  [facility]  Instruction Review Code  1- Verbalizes Understanding      Education: Flexibility, Balance, Mind/Body Relaxation: Provides group verbal/written instruction on the benefits of flexibility and balance training, including mind/body exercise modes such as yoga, pilates and tai chi.  Demonstration and skill practice provided.   Cardiac Rehab from 05/15/2017 in [facility] Cardiac and Pulmonary Rehab  Date  05/08/17  Educator  AS  Instruction Review Code  1- Verbalizes Understanding      Activity Barriers & Risk Stratification: Activity Barriers & Cardiac Risk Stratification - 06/21/19 1445      Activity Barriers & Cardiac Risk Stratification   Activity Barriers  Back Problems;Deconditioning;Muscular Weakness;Balance Concerns    Cardiac Risk Stratification  High       6 Minute Walk: 6 Minute Walk    Row Name 06/21/19 1444         6 Minute Walk   Phase  Initial     Distance  730 feet     Walk Time  6 minutes     MPH  1.38     METS  1.17     RPE  12     VO2 Peak  4.11     Symptoms  No     Resting HR  56 bpm     Resting BP  136/64     Resting Oxygen Saturation   97 %     Exercise Oxygen Saturation  during 6 min walk  97 %     Max Ex. HR  87 bpm     Max Ex. BP   156/74     2 Minute Post BP  144/74        Oxygen Initial Assessment:   Oxygen Re-Evaluation:   Oxygen Discharge (Final Oxygen Re-Evaluation):   Initial Exercise Prescription: Initial Exercise Prescription - 06/21/19 1400      Date of Initial Exercise RX and Referring Provider   Date  06/21/19    Referring Provider  Ida Rogue MD  Treadmill   MPH  1.3    Grade  0    Minutes  15    METs  2      NuStep   Level  1    SPM  80    Minutes  15    METs  2      Biostep-RELP   Level  1    SPM  50    Minutes  15    METs  2      Prescription Details   Frequency (times per week)  3    Duration  Progress to 30 minutes of continuous aerobic without signs/symptoms of physical distress      Intensity   THRR 40-80% of Max Heartrate  88-119    Ratings of Perceived Exertion  11-13    Perceived Dyspnea  0-4      Progression   Progression  Continue to progress workloads to maintain intensity without signs/symptoms of physical distress.      Resistance Training   Training Prescription  Yes    Weight  3 lb    Reps  10-15       Perform Capillary Blood Glucose checks as needed.  Exercise Prescription Changes: Exercise Prescription Changes    Row Name 06/21/19 1400 07/06/19 1400 07/15/19 1400         Response to Exercise   Blood Pressure (Admit)  136/64  108/56  122/64     Blood Pressure (Exercise)  156/74  150/62  138/64     Blood Pressure (Exit)  144/74  122/62  120/60     Heart Rate (Admit)  86 bpm  73 bpm  66 bpm     Heart Rate (Exercise)  87 bpm  96 bpm  102 bpm     Heart Rate (Exit)  58 bpm  78 bpm  71 bpm     Oxygen Saturation (Admit)  97 %  --  --     Oxygen Saturation (Exercise)  97 %  --  --     Rating of Perceived Exertion (Exercise)  '12  13  13     '$ Symptoms  fatigue at end  none  none     Comments  walk test results  --  --     Duration  --  Continue with 30 min of aerobic exercise without signs/symptoms of physical distress.  Continue with 30  min of aerobic exercise without signs/symptoms of physical distress.     Intensity  --  THRR unchanged  THRR unchanged       Progression   Progression  --  Continue to progress workloads to maintain intensity without signs/symptoms of physical distress.  Continue to progress workloads to maintain intensity without signs/symptoms of physical distress.     Average METs  --  2.07  2.15       Resistance Training   Training Prescription  --  Yes  Yes     Weight  --  3 lb   3lb     Reps  --  10-15  10-15       Interval Training   Interval Training  --  No  No       Treadmill   MPH  --  1.4  --     Grade  --  0  --     Minutes  --  15  --     METs  --  2.07  --  NuStep   Level  --  2  3     SPM  --  --  80     Minutes  --  15  15     METs  --  2.3  2.3       Biostep-RELP   Level  --  1  1     SPM  --  --  50     Minutes  --  15  15     METs  --  2  2        Exercise Comments:   Exercise Goals and Review: Exercise Goals    Row Name 06/21/19 1447             Exercise Goals   Increase Physical Activity  Yes       Intervention  Provide advice, education, support and counseling about physical activity/exercise needs.;Develop an individualized exercise prescription for aerobic and resistive training based on initial evaluation findings, risk stratification, comorbidities and participant's personal goals.       Expected Outcomes  Short Term: Attend rehab on a regular basis to increase amount of physical activity.;Long Term: Exercising regularly at least 3-5 days a week.;Long Term: Add in home exercise to make exercise part of routine and to increase amount of physical activity.       Increase Strength and Stamina  Yes       Intervention  Provide advice, education, support and counseling about physical activity/exercise needs.;Develop an individualized exercise prescription for aerobic and resistive training based on initial evaluation findings, risk stratification,  comorbidities and participant's personal goals.       Expected Outcomes  Short Term: Increase workloads from initial exercise prescription for resistance, speed, and METs.;Short Term: Perform resistance training exercises routinely during rehab and add in resistance training at home;Long Term: Improve cardiorespiratory fitness, muscular endurance and strength as measured by increased METs and functional capacity (6MWT)       Able to understand and use rate of perceived exertion (RPE) scale  Yes       Intervention  Provide education and explanation on how to use RPE scale       Expected Outcomes  Short Term: Able to use RPE daily in rehab to express subjective intensity level;Long Term:  Able to use RPE to guide intensity level when exercising independently       Able to understand and use Dyspnea scale  Yes       Intervention  Provide education and explanation on how to use Dyspnea scale       Expected Outcomes  Short Term: Able to use Dyspnea scale daily in rehab to express subjective sense of shortness of breath during exertion;Long Term: Able to use Dyspnea scale to guide intensity level when exercising independently       Knowledge and understanding of Target Heart Rate Range (THRR)  Yes       Intervention  Provide education and explanation of THRR including how the numbers were predicted and where they are located for reference       Expected Outcomes  Short Term: Able to state/look up THRR;Long Term: Able to use THRR to govern intensity when exercising independently;Short Term: Able to use daily as guideline for intensity in rehab       Able to check pulse independently  Yes       Intervention  Provide education and demonstration on how to check pulse in carotid and radial arteries.;Review the importance of  being able to check your own pulse for safety during independent exercise       Expected Outcomes  Short Term: Able to explain why pulse checking is important during independent exercise;Long  Term: Able to check pulse independently and accurately       Understanding of Exercise Prescription  Yes       Intervention  Provide education, explanation, and written materials on patient's individual exercise prescription       Expected Outcomes  Short Term: Able to explain program exercise prescription;Long Term: Able to explain home exercise prescription to exercise independently          Exercise Goals Re-Evaluation : Exercise Goals Re-Evaluation    Row Name 06/23/19 1341 07/06/19 1411 07/08/19 1318 07/15/19 1415       Exercise Goal Re-Evaluation   Exercise Goals Review  Increase Physical Activity;Able to understand and use rate of perceived exertion (RPE) scale;Knowledge and understanding of Target Heart Rate Range (THRR);Understanding of Exercise Prescription;Increase Strength and Stamina;Able to check pulse independently  Increase Physical Activity;Increase Strength and Stamina;Understanding of Exercise Prescription  Increase Physical Activity;Increase Strength and Stamina;Understanding of Exercise Prescription  Increase Physical Activity;Increase Strength and Stamina;Able to understand and use rate of perceived exertion (RPE) scale;Able to understand and use Dyspnea scale;Knowledge and understanding of Target Heart Rate Range (THRR);Able to check pulse independently;Understanding of Exercise Prescription    Comments  Reviewed RPE scale, THR and program prescription with pt today.  Pt voiced understanding and was given a copy of goals to take home.  Jenna Young is off to a good start in rehab.  She is up to 2.3 METs on the NuStep.  We will start to increase her workloads and continue to monitor her progress.  Jenna Young is glad to be back in rehab.  She is feeling pretty good and getting her stamina back. Reviewed home exercise with pt today.  Pt plans to walking and staff videos for exercise.  Reviewed THR, pulse, RPE, sign and symptoms, NTG use, and when to call 911 or MD.  Also discussed weather  considerations and indoor options.  Pt voiced understanding.  Jenna Young has done well in her first weeks of rehab.  She has moved up her levels slightly on all machines.  Staff will monitor progress.    Expected Outcomes  Short: Use RPE daily to regulate intensity. Long: Follow program prescription in THR.  Short: Start to increase workloads  Long: Continue to improve stamina.  Short: Start to add in exercise on off days from class  Long: Continue to improve stamina.  Short : exercise consistently Long: improve MET level       Discharge Exercise Prescription (Final Exercise Prescription Changes): Exercise Prescription Changes - 07/15/19 1400      Response to Exercise   Blood Pressure (Admit)  122/64    Blood Pressure (Exercise)  138/64    Blood Pressure (Exit)  120/60    Heart Rate (Admit)  66 bpm    Heart Rate (Exercise)  102 bpm    Heart Rate (Exit)  71 bpm    Rating of Perceived Exertion (Exercise)  13    Symptoms  none    Duration  Continue with 30 min of aerobic exercise without signs/symptoms of physical distress.    Intensity  THRR unchanged      Progression   Progression  Continue to progress workloads to maintain intensity without signs/symptoms of physical distress.    Average METs  2.15      Resistance  Training   Training Prescription  Yes    Weight   3lb    Reps  10-15      Interval Training   Interval Training  No      NuStep   Level  3    SPM  80    Minutes  15    METs  2.3      Biostep-RELP   Level  1    SPM  50    Minutes  15    METs  2       Nutrition:  Target Goals: Understanding of nutrition guidelines, daily intake of sodium '1500mg'$ , cholesterol '200mg'$ , calories 30% from fat and 7% or less from saturated fats, daily to have 5 or more servings of fruits and vegetables.  Education: Controlling Sodium/Reading Food Labels -Group verbal and written material supporting the discussion of sodium use in heart healthy nutrition. Review and explanation with  models, verbal and written materials for utilization of the food label.   Cardiac Rehab from 05/15/2017 in [facility] Cardiac and Pulmonary Rehab  Date  04/15/17  Educator  CR  Instruction Review Code  1- [employer] Understanding      Education: General Nutrition Guidelines/Fats and Fiber: -Group instruction provided by verbal, written material, models and posters to present the general guidelines for heart healthy nutrition. Gives an explanation and review of dietary fats and fiber.   Cardiac Rehab from 05/15/2017 in [facility] Cardiac and Pulmonary Rehab  Date  04/08/17  Educator  CR  Instruction Review Code  1- Verbalizes Understanding      Biometrics: Pre Biometrics - 06/21/19 1448      Pre Biometrics   Height  '5\' 2"'$  (1.575 m)    Weight  133 lb 11.2 oz (60.6 kg)    BMI (Calculated)  24.45    Single Leg Stand  3.22 seconds        Nutrition Therapy Plan and Nutrition Goals:   Nutrition Assessments: Nutrition Assessments - 06/21/19 1449      MEDFICTS Scores   Pre Score  24       MEDIFICTS Score Key:          ?70 Need to make dietary changes          40-70 Heart Healthy Diet         ? 40 Therapeutic Level Cholesterol Diet  Nutrition Goals Re-Evaluation:   Nutrition Goals Discharge (Final Nutrition Goals Re-Evaluation):   Psychosocial: Target Goals: Acknowledge presence or absence of significant depression and/or stress, maximize coping skills, provide positive support system. Participant is able to verbalize types and ability to use techniques and skills needed for reducing stress and depression.   Education: Depression - Provides group verbal and written instruction on the correlation between heart/lung disease and depressed mood, treatment options, and the stigmas associated with seeking treatment.   Cardiac Rehab from 05/15/2017 in [facility] Cardiac and Pulmonary Rehab  Date  04/29/17  Educator  [facility]  Instruction Review Code  1- [employer] Understanding      Education: Sleep  Hygiene -Provides group verbal and written instruction about how sleep can affect your health.  Define sleep hygiene, discuss sleep cycles and impact of sleep habits. Review good sleep hygiene tips.    Cardiac Rehab from 05/15/2017 in [facility] Cardiac and Pulmonary Rehab  Date  04/10/17  Educator  [facility]  Instruction Review Code  1- Verbalizes Understanding       Education: Stress and Anxiety: - Provides group verbal and written  instruction about the health risks of elevated stress and causes of high stress.  Discuss the correlation between heart/lung disease and anxiety and treatment options. Review healthy ways to manage with stress and anxiety.   Cardiac Rehab from 05/15/2017 in [facility] Cardiac and Pulmonary Rehab  Date  05/13/17  Educator  [facility]  Instruction Review Code  1- Verbalizes Understanding       Initial Review & Psychosocial Screening: Initial Psych Review & Screening - 06/17/19 1109      Initial Review   Current issues with  None Identified      Family Dynamics   Good Support System?  Yes   children     Barriers   Psychosocial barriers to participate in program  There are no identifiable barriers or psychosocial needs.;The patient should benefit from training in stress management and relaxation.      Screening Interventions   Expected Outcomes  Short Term goal: Utilizing psychosocial counselor, staff and physician to assist with identification of specific Stressors or current issues interfering with healing process. Setting desired goal for each stressor or current issue identified.;Long Term Goal: Stressors or current issues are controlled or eliminated.;Short Term goal: Identification and review with participant of any Quality of Life or Depression concerns found by scoring the questionnaire.;Long Term goal: The participant improves quality of Life and PHQ9 Scores as seen by post scores and/or verbalization of changes       Quality of Life Scores:  Quality of Life - 06/21/19 1448       Quality of Life   Select  Quality of Life      Quality of Life Scores   Health/Function Pre  27.43 %    Socioeconomic Pre  30 %    Psych/Spiritual Pre  30 %    Family Pre  30 %    GLOBAL Pre  28.8 %      Scores of 19 and below usually indicate a poorer quality of life in these areas.  A difference of  2-3 points is a clinically meaningful difference.  A difference of 2-3 points in the total score of the Quality of Life Index has been associated with significant improvement in overall quality of life, self-image, physical symptoms, and general health in studies assessing change in quality of life.  PHQ-9: Recent Review Flowsheet Data    Depression screen [facility] 2/9 06/21/2019 04/14/2019 04/08/2018 05/13/2017 03/31/2017   Decreased Interest 0 0 0 0 0   Down, Depressed, Hopeless 0 0 0 0 0   PHQ - 2 Score 0 0 0 0 0   Altered sleeping 0 0 0 0 0   Tired, decreased energy 1 0 0 0 0   Change in appetite 0 0 0 0 0   Feeling bad or failure about yourself  0 0 0 0 0   Trouble concentrating 0 0 0 0 0   Moving slowly or fidgety/restless 0 0 0 0 0   Suicidal thoughts 0 0 0 0 0   PHQ-9 Score 1 0 0 0 0   Difficult doing work/chores Somewhat difficult Not difficult at all Not difficult at all Not difficult at all Not difficult at all     Interpretation of Total Score  Total Score Depression Severity:  1-4 = Minimal depression, 5-9 = Mild depression, 10-14 = Moderate depression, 15-19 = Moderately severe depression, 20-27 = Severe depression   Psychosocial Evaluation and Intervention: Psychosocial Evaluation - 06/17/19 1109      Psychosocial Evaluation &  Interventions   Interventions  Encouraged to exercise with the program and follow exercise prescription    Comments  Jenna Young reports doing well. She has a great support system of children and grandchildren. She states doing fine during the pandemic, staying home. She sometimes still struggles with appetite, but she is working on it. She wants to come  to the program to get  stronger and feel like she has increased stamina    Expected Outcomes  Short: attend Cardiac Rehab for exercise and education. Long: maintain positive self care habits.    Continue Psychosocial Services   Follow up required by staff       Psychosocial Re-Evaluation: Psychosocial Re-Evaluation    [place] Name 07/08/19 1318             Psychosocial Re-Evaluation   Current issues with  None Identified       Comments  Jenna Young is doing well mentally.  She continues to sleep well.  Her grandson has moved out since she was here last.  Overall she stays positive.       Expected Outcomes  Short: Continue to exercise for more stamina  Long: Continue to stay positive.       Interventions  Encouraged to attend Cardiac Rehabilitation for the exercise       Continue Psychosocial Services   Follow up required by staff          Psychosocial Discharge (Final Psychosocial Re-Evaluation): Psychosocial Re-Evaluation - 07/08/19 1318      Psychosocial Re-Evaluation   Current issues with  None Identified    Comments  Jenna Young is doing well mentally.  She continues to sleep well.  Her grandson has moved out since she was here last.  Overall she stays positive.    Expected Outcomes  Short: Continue to exercise for more stamina  Long: Continue to stay positive.    Interventions  Encouraged to attend Cardiac Rehabilitation for the exercise    Continue Psychosocial Services   Follow up required by staff       Vocational Rehabilitation: Provide vocational rehab assistance to qualifying candidates.   Vocational Rehab Evaluation & Intervention: Vocational Rehab - 06/17/19 1105      Initial Vocational Rehab Evaluation & Intervention   Assessment shows need for Vocational Rehabilitation  No       Education: Education Goals: Education classes will be provided on a variety of topics geared toward better understanding of heart health and risk factor modification. Participant will state  understanding/return demonstration of topics presented as noted by education test scores.  Learning Barriers/Preferences: Learning Barriers/Preferences - 06/17/19 1105      Learning Barriers/Preferences   Learning Barriers  Hearing    Learning Preferences  None       General Cardiac Education Topics:  AED/CPR: - Group verbal and written instruction with the use of models to demonstrate the basic use of the AED with the basic ABC's of resuscitation.   Cardiac Rehab from 05/15/2017 in [facility] Cardiac and Pulmonary Rehab  Date  04/22/17  Educator  SB  Instruction Review Code  1- Verbalizes Understanding      Anatomy & Physiology of the Heart: - Group verbal and written instruction and models provide basic cardiac anatomy and physiology, with the coronary electrical and arterial systems. Review of Valvular disease and Heart Failure   Cardiac Rehab from 05/15/2017 in [facility] Cardiac and Pulmonary Rehab  Date  04/03/17  Educator  CE  Instruction Review Code  1- Verbalizes Understanding  Cardiac Procedures: - Group verbal and written instruction to review commonly prescribed medications for heart disease. Reviews the medication, class of the drug, and side effects. Includes the steps to properly store meds and maintain the prescription regimen. (beta blockers and nitrates)   Cardiac Rehab from 05/15/2017 in [facility] Cardiac and Pulmonary Rehab  Date  04/17/17  Educator  CE  Instruction Review Code  1- Verbalizes Understanding      Cardiac Medications I: - Group verbal and written instruction to review commonly prescribed medications for heart disease. Reviews the medication, class of the drug, and side effects. Includes the steps to properly store meds and maintain the prescription regimen.   Cardiac Medications II: -Group verbal and written instruction to review commonly prescribed medications for heart disease. Reviews the medication, class of the drug, and side effects. (all other drug  classes)   Cardiac Rehab from 05/15/2017 in [facility] Cardiac and Pulmonary Rehab  Date  05/15/17 [facility]]  Educator  [facility]  Instruction Review Code  1- Verbalizes Understanding       Go Sex-Intimacy & Heart Disease, Get SMART - Goal Setting: - Group verbal and written instruction through game format to discuss heart disease and the return to sexual intimacy. Provides group verbal and written material to discuss and apply goal setting through the application of the S.M.A.R.T. Method.   Cardiac Rehab from 05/15/2017 in [facility] Cardiac and Pulmonary Rehab  Date  04/17/17  Educator  CE  Instruction Review Code  1- Verbalizes Understanding      Other Matters of the Heart: - Provides group verbal, written materials and models to describe Stable Angina and Peripheral Artery. Includes description of the disease process and treatment options available to the cardiac patient.   Cardiac Rehab from 05/15/2017 in [facility] Cardiac and Pulmonary Rehab  Date  04/03/17  Educator  CE  Instruction Review Code  1- Verbalizes Understanding      Infection Prevention: - Provides verbal and written material to individual with discussion of infection control including proper hand washing and proper equipment cleaning during exercise session.   Cardiac Rehab from 06/21/2019 in [facility] Cardiac and Pulmonary Rehab  Date  06/21/19  Educator  [facility]  Instruction Review Code  1- Verbalizes Understanding      Falls Prevention: - Provides verbal and written material to individual with discussion of falls prevention and safety.   Cardiac Rehab from 06/21/2019 in [facility] Cardiac and Pulmonary Rehab  Date  06/21/19  Educator  [facility]  Instruction Review Code  1- Verbalizes Understanding      Other: -Provides group and verbal instruction on various topics (see comments)   Knowledge Questionnaire Score: Knowledge Questionnaire Score - 06/21/19 1449      Knowledge Questionnaire Score   Pre Score  22/26 Education Focus: HR,  nutrition, exercise       Core Components/Risk Factors/Patient Goals at Admission: Personal Goals and Risk Factors at Admission - 06/21/19 1449      Core Components/Risk Factors/Patient Goals on Admission    Weight Management  Yes;Weight Maintenance    Intervention  Weight Management: Develop a combined nutrition and exercise program designed to reach desired caloric intake, while maintaining appropriate intake of nutrient and fiber, sodium and fats, and appropriate energy expenditure required for the weight goal.;Weight Management: Provide education and appropriate resources to help participant work on and attain dietary goals.    Admit Weight  133 lb 11.2 oz (60.6 kg)    Goal Weight: Short Term  133 lb (  60.3 kg)    Goal Weight: Long Term  133 lb (60.3 kg)    Expected Outcomes  Short Term: Continue to assess and modify interventions until short term weight is achieved;Long Term: Adherence to nutrition and physical activity/exercise program aimed toward attainment of established weight goal;Weight Maintenance: Understanding of the daily nutrition guidelines, which includes 25-35% calories from fat, 7% or less cal from saturated fats, less than '200mg'$  cholesterol, less than 1.5gm of sodium, & 5 or more servings of fruits and vegetables daily    Hypertension  Yes    Intervention  Provide education on lifestyle modifcations including regular physical activity/exercise, weight management, moderate sodium restriction and increased consumption of fresh fruit, vegetables, and low fat dairy, alcohol moderation, and smoking cessation.;Monitor prescription use compliance.    Expected Outcomes  Short Term: Continued assessment and intervention until BP is < 140/54m HG in hypertensive participants. < 130/874mHG in hypertensive participants with diabetes, heart failure or chronic kidney disease.;Long Term: Maintenance of blood pressure at goal levels.    Lipids  Yes    Intervention  Provide education and  support for participant on nutrition & aerobic/resistive exercise along with prescribed medications to achieve LDL '70mg'$ , HDL >'40mg'$ .    Expected Outcomes  Short Term: Participant states understanding of desired cholesterol values and is compliant with medications prescribed. Participant is following exercise prescription and nutrition guidelines.;Long Term: Cholesterol controlled with medications as prescribed, with individualized exercise RX and with personalized nutrition plan. Value goals: LDL < '70mg'$ , HDL > 40 mg.       Education:Diabetes - Individual verbal and written instruction to review signs/symptoms of diabetes, desired ranges of glucose level fasting, after meals and with exercise. Acknowledge that pre and post exercise glucose checks will be done for 3 sessions at entry of program.   Education: Know Your Numbers and Risk Factors: -Group verbal and written instruction about important numbers in your health.  Discussion of what are risk factors and how they play a role in the disease process.  Review of Cholesterol, Blood Pressure, Diabetes, and BMI and the role they play in your overall health.   [facility]  [facility]  [facility]  [facility]  1- Verbalizes Understanding      Core Components/Risk Factors/Patient Goals Review:  Goals and Risk Factor Review    Row Name 07/08/19 1319             Core Components/Risk Factors/Patient Goals Review   Personal Goals Review  Weight Management/Obesity;Hypertension;Lipids       Review  Jenna Young off to a good start in rehab. Her weight is staying steady and doing well.  Her pressures have been doing okay since her medications changed.  She continues to check them at home.       Expected Outcomes  Short: Continue to monitor pressures  Long: Continue to monitor risk factors.          Core Components/Risk Factors/Patient Goals at Discharge (Final  Review):  Goals and Risk Factor Review - 07/08/19 1319      Core Components/Risk Factors/Patient Goals Review   Personal Goals Review  Weight Management/Obesity;Hypertension;Lipids    Review  BeCarmelas off to a good start in rehab. Her weight is staying steady and doing well.  Her pressures have been doing okay since her medications changed.  She continues to check them at home.    Expected Outcomes  Short:  Continue to monitor pressures  Long: Continue to monitor risk factors.       ITP Comments: ITP Comments    Row Name 06/17/19 1108 06/21/19 1440 06/23/19 1340 06/30/19 0538 07/28/19 0553   ITP Comments  Initial telephone encounter completed. Diagnosis can be found in [facility] 3/12. EP orientation scheduled for 4/12 at 11am  Completed 6MWT and gym orientation.  Initial ITP created and sent for review to Dr. Emily Filbert, Medical Director.  First full day of exercise!  Patient was oriented to gym and equipment including functions, settings, policies, and procedures.  Patient's individual exercise prescription and treatment plan were reviewed.  All starting workloads were established based on the results of the 6 minute walk test done at initial orientation visit.  The plan for exercise progression was also introduced and progression will be customized based on patient's performance and goals.  30 Day review completed. Medical Director review done, changes made as directed,and approval shown by signature of Market researcher.  New to program  30 Day review completed. ITP review done, changes made as directed,and approval shown by signature of  Scientist, research (life sciences).      Comments:

## 2019-07-29 ENCOUNTER — Encounter: Payer: Medicare Other | Admitting: *Deleted

## 2019-07-29 ENCOUNTER — Other Ambulatory Visit: Payer: Self-pay

## 2019-07-29 DIAGNOSIS — I214 Non-ST elevation (NSTEMI) myocardial infarction: Secondary | ICD-10-CM

## 2019-07-29 DIAGNOSIS — I252 Old myocardial infarction: Secondary | ICD-10-CM | POA: Diagnosis not present

## 2019-07-29 NOTE — Progress Notes (Signed)
Daily Session Note  Patient Details  Name: Jenna Young MRN: [id number] Date of Birth: 09-Dec-1931 Referring Provider:     Cardiac Rehab from 06/21/2019 in [facility] Cardiac and Pulmonary Rehab  Referring Provider  Ida Rogue MD      Encounter Date: 07/29/2019  Check In: Session Check In - 07/29/19 1259      Check-In   Supervising physician immediately available to respond to emergencies  See telemetry face sheet for immediately available ER MD    Location  [facility]-Cardiac & Pulmonary Rehab    Staff Present  Renita Papa, RN BSN;Joseph [address] [place], [place], [place], CCRP, CCET    Virtual Visit  No    Medication changes reported      No    Fall or balance concerns reported     No    Warm-up and Cool-down  Performed on first and last piece of equipment    Resistance Training Performed  Yes    VAD Patient?  No    PAD/SET Patient?  No      Pain Assessment   Currently in Pain?  No/denies          Social History   Tobacco Use  Smoking Status Former Smoker  . Quit date: 03/11/1968  . Years since quitting: 51.4  Smokeless Tobacco Never Used    Goals Met:  Independence with exercise equipment Exercise tolerated well No report of cardiac concerns or symptoms Strength training completed today  Goals Unmet:  Not Applicable  Comments: Pt able to follow exercise prescription today without complaint.  Will continue to monitor for progression.    Dr. Emily Filbert is Medical Director for [place] and [facility].

## 2019-08-02 ENCOUNTER — Encounter: Payer: Medicare Other | Admitting: *Deleted

## 2019-08-02 ENCOUNTER — Other Ambulatory Visit: Payer: Self-pay

## 2019-08-02 DIAGNOSIS — I252 Old myocardial infarction: Secondary | ICD-10-CM | POA: Diagnosis not present

## 2019-08-02 DIAGNOSIS — I214 Non-ST elevation (NSTEMI) myocardial infarction: Secondary | ICD-10-CM

## 2019-08-02 NOTE — Progress Notes (Signed)
Daily Session Note  Patient Details  Name: Jenna Young MRN: [id number] Date of Birth: [birth year] Referring Provider:     Cardiac Rehab from 06/21/2019 in [facility] Cardiac and Pulmonary Rehab  Referring Provider  Ida Rogue MD      Encounter Date: 08/02/2019  Check In: Session Check In - 08/02/19 1303      Check-In   Supervising physician immediately available to respond to emergencies  See telemetry face sheet for immediately available ER MD    Location  [facility]-Cardiac & Pulmonary Rehab    Staff Present  Renita Papa, RN BSN;Joseph Hood RCP,RRT,BSRT;Melissa Perry RDN, LDN    Virtual Visit  No    Medication changes reported      No    Fall or balance concerns reported     No    Warm-up and Cool-down  Performed on first and last piece of equipment    Resistance Training Performed  Yes    VAD Patient?  No    PAD/SET Patient?  No      Pain Assessment   Currently in Pain?  No/denies          Social History   Tobacco Use  Smoking Status Former Smoker  . Quit date: 03/11/1968  . Years since quitting: 51.4  Smokeless Tobacco Never Used    Goals Met:  Independence with exercise equipment Exercise tolerated well No report of cardiac concerns or symptoms Strength training completed today  Goals Unmet:  Not Applicable  Comments: Pt able to follow exercise prescription today without complaint.  Will continue to monitor for progression.    Dr. Emily Filbert is Medical Director for [place] and [facility].

## 2019-08-04 ENCOUNTER — Encounter: Payer: Medicare Other | Admitting: *Deleted

## 2019-08-04 ENCOUNTER — Other Ambulatory Visit: Payer: Self-pay

## 2019-08-04 DIAGNOSIS — I214 Non-ST elevation (NSTEMI) myocardial infarction: Secondary | ICD-10-CM

## 2019-08-04 DIAGNOSIS — I252 Old myocardial infarction: Secondary | ICD-10-CM | POA: Diagnosis not present

## 2019-08-04 NOTE — Progress Notes (Signed)
Daily Session Note  Patient Details  Name: Niza Soderholm MRN: [id number] Date of Birth: 15-Jun-1931 Referring Provider:     Cardiac Rehab from 06/21/2019 in [facility] Cardiac and Pulmonary Rehab  Referring Provider  Ida Rogue MD      Encounter Date: 08/04/2019  Check In: Session Check In - 08/04/19 1311      Check-In   Supervising physician immediately available to respond to emergencies  See telemetry face sheet for immediately available ER MD    Location  [facility]-Cardiac & Pulmonary Rehab    Staff Present  Renita Papa, RN BSN;Jessica Robbins, MA, RCEP, CCRP, CCET;Amanda Sommer, [place], ACSM CEP, Exercise Physiologist    Virtual Visit  No    Medication changes reported      No    Fall or balance concerns reported     No    Warm-up and Cool-down  Performed on first and last piece of equipment    Resistance Training Performed  Yes    VAD Patient?  No    PAD/SET Patient?  No      Pain Assessment   Currently in Pain?  No/denies          Social History   Tobacco Use  Smoking Status Former Smoker  . Quit date: 03/11/1968  . Years since quitting: 51.4  Smokeless Tobacco Never Used    Goals Met:  Independence with exercise equipment Exercise tolerated well No report of cardiac concerns or symptoms Strength training completed today  Goals Unmet:  Not Applicable  Comments: Pt able to follow exercise prescription today without complaint.  Will continue to monitor for progression.    Dr. Emily Filbert is Medical Director for [place] and [facility].

## 2019-08-05 ENCOUNTER — Encounter: Payer: Medicare Other | Admitting: *Deleted

## 2019-08-05 ENCOUNTER — Other Ambulatory Visit: Payer: Self-pay

## 2019-08-05 DIAGNOSIS — I252 Old myocardial infarction: Secondary | ICD-10-CM | POA: Diagnosis not present

## 2019-08-05 DIAGNOSIS — I214 Non-ST elevation (NSTEMI) myocardial infarction: Secondary | ICD-10-CM

## 2019-08-05 NOTE — Progress Notes (Signed)
Daily Session Note  Patient Details  Name: Jenna Young MRN: [id number] Date of Birth: 1931/11/17 Referring Provider:     Cardiac Rehab from 06/21/2019 in [facility] Cardiac and Pulmonary Rehab  Referring Provider  Ida Rogue MD      Encounter Date: 08/05/2019  Check In: Session Check In - 08/05/19 1416      Check-In   Supervising physician immediately available to respond to emergencies  See telemetry face sheet for immediately available ER MD    Location  [facility]-Cardiac & Pulmonary Rehab    Staff Present  Renita Papa, RN BSN;Joseph [address] [place], [place], [place], CCRP, CCET    Virtual Visit  No    Medication changes reported      No    Fall or balance concerns reported     No    Warm-up and Cool-down  Performed on first and last piece of equipment    Resistance Training Performed  Yes    VAD Patient?  No    PAD/SET Patient?  No      Pain Assessment   Currently in Pain?  No/denies          Social History   Tobacco Use  Smoking Status Former Smoker  . Quit date: 03/11/1968  . Years since quitting: 51.4  Smokeless Tobacco Never Used    Goals Met:  Independence with exercise equipment Exercise tolerated well No report of cardiac concerns or symptoms Strength training completed today  Goals Unmet:  Not Applicable  Comments: Pt able to follow exercise prescription today without complaint.  Will continue to monitor for progression.    Dr. Emily Filbert is Medical Director for [place] and [facility].

## 2019-08-10 DIAGNOSIS — Z961 Presence of intraocular lens: Secondary | ICD-10-CM | POA: Diagnosis not present

## 2019-08-10 DIAGNOSIS — S0502XA Injury of conjunctiva and corneal abrasion without foreign body, left eye, initial encounter: Secondary | ICD-10-CM | POA: Diagnosis not present

## 2019-08-10 DIAGNOSIS — H02055 Trichiasis without entropian left lower eyelid: Secondary | ICD-10-CM | POA: Diagnosis not present

## 2019-08-11 ENCOUNTER — Other Ambulatory Visit: Payer: Self-pay

## 2019-08-11 ENCOUNTER — Encounter: Payer: Medicare Other | Attending: Cardiovascular Disease | Admitting: *Deleted

## 2019-08-11 DIAGNOSIS — I214 Non-ST elevation (NSTEMI) myocardial infarction: Secondary | ICD-10-CM

## 2019-08-11 DIAGNOSIS — I252 Old myocardial infarction: Secondary | ICD-10-CM | POA: Insufficient documentation

## 2019-08-11 NOTE — Progress Notes (Signed)
Daily Session Note  Patient Details  Name: Jenna Young MRN: [id number] Date of Birth: May 08, 1931 Referring Provider:     Cardiac Rehab from 06/21/2019 in [facility] Cardiac and Pulmonary Rehab  Referring Provider  Ida Rogue MD      Encounter Date: 08/11/2019  Check In: Session Check In - 08/11/19 1302      Check-In   Supervising physician immediately available to respond to emergencies  See telemetry face sheet for immediately available ER MD    Location  [facility]-Cardiac & Pulmonary Rehab    Staff Present  Renita Papa, RN BSN;Jessica Luan Pulling, MA, RCEP, CCRP, CCET;Susanne Bice, RN, BSN, Jacklynn Bue, MS Exercise Physiologist    Virtual Visit  No    Medication changes reported      No    Fall or balance concerns reported     No    Warm-up and Cool-down  Performed on first and last piece of equipment    Resistance Training Performed  Yes    VAD Patient?  No    PAD/SET Patient?  No      Pain Assessment   Currently in Pain?  No/denies          Social History   Tobacco Use  Smoking Status Former Smoker  . Quit date: 03/11/1968  . Years since quitting: 51.4  Smokeless Tobacco Never Used    Goals Met:  Independence with exercise equipment Exercise tolerated well No report of cardiac concerns or symptoms Strength training completed today  Goals Unmet:  Not Applicable  Comments: Pt able to follow exercise prescription today without complaint.  Will continue to monitor for progression.    Dr. Emily Filbert is Medical Director for [place] and [facility].

## 2019-08-12 ENCOUNTER — Other Ambulatory Visit: Payer: Self-pay

## 2019-08-12 ENCOUNTER — Encounter: Payer: Medicare Other | Admitting: *Deleted

## 2019-08-12 DIAGNOSIS — I252 Old myocardial infarction: Secondary | ICD-10-CM | POA: Diagnosis not present

## 2019-08-12 DIAGNOSIS — I214 Non-ST elevation (NSTEMI) myocardial infarction: Secondary | ICD-10-CM

## 2019-08-12 NOTE — Progress Notes (Signed)
Daily Session Note  Patient Details  Name: Jenna Young MRN: [id number] Date of Birth: 05/01/1931 Referring Provider:     Cardiac Rehab from 06/21/2019 in [facility] Cardiac and Pulmonary Rehab  Referring Provider  Gollan, Timothy MD      Encounter Date: 08/12/2019  Check In: Session Check In - 08/12/19 1259      Check-In   Supervising physician immediately available to respond to emergencies  See telemetry face sheet for immediately available ER MD    Location  [facility]-Cardiac & Pulmonary Rehab    Staff Present  Meredith Craven, RN BSN;Amanda Sommer, BA, ACSM CEP, Exercise Physiologist;Kara Langdon, MS Exercise Physiologist    Virtual Visit  No    Medication changes reported      No    Fall or balance concerns reported     No    Warm-up and Cool-down  Performed on first and last piece of equipment    Resistance Training Performed  Yes    VAD Patient?  No    PAD/SET Patient?  No      Pain Assessment   Currently in Pain?  No/denies          Social History   Tobacco Use  Smoking Status Former Smoker  . Quit date: 03/11/1968  . Years since quitting: 51.4  Smokeless Tobacco Never Used    Goals Met:  Independence with exercise equipment Exercise tolerated well No report of cardiac concerns or symptoms Strength training completed today  Goals Unmet:  Not Applicable  Comments: Pt able to follow exercise prescription today without complaint.  Will continue to monitor for progression.    Dr. Mark Miller is Medical Director for [facility] and [facility]. 

## 2019-08-16 ENCOUNTER — Other Ambulatory Visit: Payer: Self-pay

## 2019-08-16 DIAGNOSIS — I214 Non-ST elevation (NSTEMI) myocardial infarction: Secondary | ICD-10-CM

## 2019-08-16 DIAGNOSIS — I252 Old myocardial infarction: Secondary | ICD-10-CM | POA: Diagnosis not present

## 2019-08-16 NOTE — Progress Notes (Signed)
Daily Session Note  Patient Details  Name: Jenna Young MRN: [id number] Date of Birth: [birth year] Referring Provider:     Cardiac Rehab from 06/21/2019 in [facility] Cardiac and Pulmonary Rehab  Referring Provider  Ida Rogue MD      Encounter Date: 08/16/2019  Check In: Session Check In - 08/16/19 1311      Check-In   Supervising physician immediately available to respond to emergencies  See telemetry face sheet for immediately available ER MD    Location  [facility]-Cardiac & Pulmonary Rehab    Staff Present  Hope Budds RDN, LDN;Jessica Luan Pulling, MA, RCEP, CCRP, CCET;Virgina Deakins Mel Almond, RN, BSN    Virtual Visit  No    Medication changes reported      No    Fall or balance concerns reported     No    Tobacco Cessation  No Change    Warm-up and Cool-down  Performed on first and last piece of equipment    Resistance Training Performed  Yes    VAD Patient?  No    PAD/SET Patient?  No      Pain Assessment   Currently in Pain?  No/denies          Social History   Tobacco Use  Smoking Status Former Smoker  . Quit date: 03/11/1968  . Years since quitting: 51.4  Smokeless Tobacco Never Used    Goals Met:  Independence with exercise equipment Exercise tolerated well No report of cardiac concerns or symptoms  Goals Unmet:  Not Applicable  Comments: Pt able to follow exercise prescription today without complaint.  Will continue to monitor for progression.   Dr. Emily Filbert is Medical Director for [place] and [facility].

## 2019-08-18 ENCOUNTER — Encounter: Payer: Medicare Other | Admitting: *Deleted

## 2019-08-18 ENCOUNTER — Other Ambulatory Visit: Payer: Self-pay

## 2019-08-18 DIAGNOSIS — I214 Non-ST elevation (NSTEMI) myocardial infarction: Secondary | ICD-10-CM

## 2019-08-18 DIAGNOSIS — I252 Old myocardial infarction: Secondary | ICD-10-CM | POA: Diagnosis not present

## 2019-08-18 NOTE — Progress Notes (Signed)
Daily Session Note  Patient Details  Name: Jenna Young MRN: [id number] Date of Birth: 07-31-1931 Referring Provider:     Cardiac Rehab from 06/21/2019 in [facility] Cardiac and Pulmonary Rehab  Referring Provider  Ida Rogue MD      Encounter Date: 08/18/2019  Check In: Session Check In - 08/18/19 1326      Check-In   Supervising physician immediately available to respond to emergencies  See telemetry face sheet for immediately available ER MD    Location  [facility]-Cardiac & Pulmonary Rehab    Staff Present  Heath Lark, RN, BSN, CCRP;Melissa Caiola RDN, LDN;Joseph Hood RCP,RRT,BSRT    Virtual Visit  No    Medication changes reported      No    Fall or balance concerns reported     No    Warm-up and Cool-down  Performed on first and last piece of equipment    Resistance Training Performed  Yes    VAD Patient?  No    PAD/SET Patient?  No      Pain Assessment   Currently in Pain?  No/denies          Social History   Tobacco Use  Smoking Status Former Smoker  . Quit date: 03/11/1968  . Years since quitting: 51.4  Smokeless Tobacco Never Used    Goals Met:  Independence with exercise equipment Exercise tolerated well No report of cardiac concerns or symptoms  Goals Unmet:  Not Applicable  Comments: Pt able to follow exercise prescription today without complaint.  Will continue to monitor for progression.    Dr. Emily Filbert is Medical Director for [place] and [facility].

## 2019-08-19 ENCOUNTER — Other Ambulatory Visit: Payer: Self-pay

## 2019-08-19 DIAGNOSIS — I252 Old myocardial infarction: Secondary | ICD-10-CM | POA: Diagnosis not present

## 2019-08-19 DIAGNOSIS — I214 Non-ST elevation (NSTEMI) myocardial infarction: Secondary | ICD-10-CM

## 2019-08-19 NOTE — Progress Notes (Signed)
Daily Session Note  Patient Details  Name: Jenna Young MRN: [id number] Date of Birth: 01/13/1932 Referring Provider:     Cardiac Rehab from 06/21/2019 in [facility] Cardiac and Pulmonary Rehab  Referring Provider Ida Rogue MD      Encounter Date: 08/19/2019  Check In:  Session Check In - 08/19/19 1311      Check-In   Supervising physician immediately available to respond to emergencies See telemetry face sheet for immediately available ER MD    Location [facility]-Cardiac & Pulmonary Rehab    Staff Present Vida Rigger RN, BSN;Jessica Luan Pulling, MA, RCEP, CCRP, CCET;Joseph Hood RCP,RRT,BSRT    Virtual Visit No    Medication changes reported     No    Fall or balance concerns reported    No    Warm-up and Cool-down Performed on first and last piece of equipment    Resistance Training Performed Yes    VAD Patient? No    PAD/SET Patient? No      Pain Assessment   Currently in Pain? No/denies              Social History   Tobacco Use  Smoking Status Former Smoker  . Quit date: 03/11/1968  . Years since quitting: 51.4  Smokeless Tobacco Never Used    Goals Met:  Proper associated with RPD/PD & O2 Sat Independence with exercise equipment Exercise tolerated well No report of cardiac concerns or symptoms Strength training completed today  Goals Unmet:  Not Applicable  Comments: Pt able to follow exercise prescription today without complaint.  Will continue to monitor for progression.   Dr. Emily Filbert is Medical Director for [place] and [facility].

## 2019-08-23 ENCOUNTER — Encounter: Payer: Medicare Other | Admitting: *Deleted

## 2019-08-23 ENCOUNTER — Other Ambulatory Visit: Payer: Self-pay

## 2019-08-23 DIAGNOSIS — I252 Old myocardial infarction: Secondary | ICD-10-CM | POA: Diagnosis not present

## 2019-08-23 DIAGNOSIS — I214 Non-ST elevation (NSTEMI) myocardial infarction: Secondary | ICD-10-CM

## 2019-08-23 NOTE — Progress Notes (Signed)
Daily Session Note  Patient Details  Name: Jenna Young MRN: [id number] Date of Birth: 1932/02/27 Referring Provider:     Cardiac Rehab from 06/21/2019 in [facility] Cardiac [facility]  Referring Provider Ida Rogue MD      Encounter Date: 08/23/2019  Check In:  Session Check In - 08/23/19 1257      Check-In   Supervising physician immediately available to respond to emergencies See telemetry face sheet for immediately available ER MD    Location [facility]-Cardiac & Pulmonary Rehab    Staff Present Renita Papa, RN BSN;Joseph [address] [place], [place], [place], CCRP, CCET    Virtual Visit No    Medication changes reported     No    Fall or balance concerns reported    No    Warm-up and Cool-down Performed on first and last piece of equipment    Resistance Training Performed Yes    VAD Patient? No    PAD/SET Patient? No      Pain Assessment   Currently in Pain? No/denies              Social History   Tobacco Use  Smoking Status Former Smoker  . Quit date: 03/11/1968  . Years since quitting: 51.4  Smokeless Tobacco Never Used    Goals Met:  Independence with exercise equipment Exercise tolerated well No report of cardiac concerns or symptoms Strength training completed today  Goals Unmet:  Not Applicable  Comments: Pt able to follow exercise prescription today without complaint.  Will continue to monitor for progression.    Dr. Emily Filbert is Medical Director for [facility].

## 2019-08-25 ENCOUNTER — Encounter: Payer: Medicare Other | Admitting: *Deleted

## 2019-08-25 ENCOUNTER — Other Ambulatory Visit: Payer: Self-pay

## 2019-08-25 ENCOUNTER — Encounter: Payer: Self-pay | Admitting: *Deleted

## 2019-08-25 DIAGNOSIS — I252 Old myocardial infarction: Secondary | ICD-10-CM | POA: Diagnosis not present

## 2019-08-25 DIAGNOSIS — I214 Non-ST elevation (NSTEMI) myocardial infarction: Secondary | ICD-10-CM

## 2019-08-25 NOTE — Progress Notes (Signed)
Cardiac Individual Treatment Plan  Patient Details  Name: Jenna Young MRN: [id number] Date of Birth: 01/18/1932 Referring Provider:     Cardiac Rehab from 06/21/2019 in [facility] Cardiac and Pulmonary Rehab  Referring Provider Gollan, Timothy MD      Initial Encounter Date:    Cardiac Rehab from 06/21/2019 in [facility] Cardiac [facility] Pulmonary Re[facility]  Date 06/21/19      Visit Diagnosis: NSTEMI (non-ST elevated myocardial infarction) (HCC)  Patient's Home Medications on Admission:  Current Outpatient Medications:  .  aspirin (ASPIRIN EC) 81 MG EC tablet, Take 81 mg by mouth daily. Swallow whole. , Disp: , Rfl:  .  atorvastatin (LIPITOR) 40 MG tablet, Take 1 tablet (40 mg total) by mouth daily at 6 PM., Disp: 90 tablet, Rfl: 0 .  carvedilol (COREG) 3.125 MG tablet, Take 1 tablet (3.125 mg total) by mouth 2 (two) times daily., Disp: 180 tablet, Rfl: 3 .  Cholecalciferol (VITAMIN D) 2000 UNITS CAPS, Take 1 capsule by mouth daily.  , Disp: , Rfl:  .  clopidogrel (PLAVIX) 75 MG tablet, Take 1 tablet (75 mg total) by mouth daily with breakfast., Disp: 90 tablet, Rfl: 3 .  cyanocobalamin (,VITAMIN B-12,) 1000 MCG/ML injection, Inject 1,000 mcg into the muscle every 3 (three) months., Disp: , Rfl:  .  isosorbide mononitrate (IMDUR) 30 MG 24 hr tablet, Take 0.5 tablets (15 mg total) by mouth daily., Disp: 45 tablet, Rfl: 3 .  losartan (COZAAR) 25 MG tablet, Take 1 tablet (25 mg total) by mouth daily., Disp: 90 tablet, Rfl: 0 .  nitroGLYCERIN (NITROSTAT) 0.4 MG SL tablet, PLACE 1 TABLET UNDER TONGUE EVERY 5 MIN AS NEEDED FOR CHEST PAIN IF NO RELIEF IN15 MIN CALL 911 (MAX 3 TABS), Disp: 25 tablet, Rfl: 1 .  omeprazole (PRILOSEC) 20 MG capsule, Take 1 capsule (20 mg total) by mouth daily as needed., Disp: 90 capsule, Rfl: 3 .  terconazole (TERAZOL 7) 0.4 % vaginal cream, Apply to affected areas of vulva daily for itching as needed, Disp: 45 g, Rfl: 0  Past Medical History: Past Medical History:    Diagnosis Date  . Allergic rhinitis   . Anemia   . Anxiety   . GERD (gastroesophageal reflux disease)   . History of radiation therapy 04/29/16- 05/24/16   Left Breast 40.05 Gy in 15 fractions, Left Breast boost 10 Gy in 5 fractions.   . Hyperlipidemia   . Hypertension    treated by Dr Gollan-labile recently with syncope episode  . Osteopenia   . Osteoporosis   . Personal history of radiation therapy 2018    Tobacco Use: Social History   Tobacco Use  Smoking Status Former Smoker  . Quit date: 03/11/1968  . Years since quitting: 51.4  Smokeless Tobacco Never Used    Labs: Recent Review Flowsheet Data    Labs for ITP Cardiac and Pulmonary Rehab Latest Ref Rng & Units 02/11/2017 03/31/2017 10/17/2017 04/08/2018 04/14/2019   Cholestrol 0 - 200 mg/dL 173 125 121 140 200   LDLCALC 0 - 99 mg/dL 100(H) 56 51 66 121(H)   LDLDIRECT mg/dL - - - - -   HDL >39.00 mg/dL 53 50.50 53 54.00 55.50   Trlycerides 0 - 149 mg/dL 98 92.0 83 101.0 117.0   Hemoglobin A1c 4.6 - 6.5 % - - - - 6.2   TCO2 0 - 100 mmol/L - - - - -       Exercise Target Goals: Exercise Program Goal: Individual exercise prescription set   using results from initial 6 min walk test and THRR while considering  patient's activity barriers and safety.   Exercise Prescription Goal: Initial exercise prescription builds to 30-45 minutes a day of aerobic activity, 2-3 days per week.  Home exercise guidelines will be given to patient during program as part of exercise prescription that the participant will acknowledge.   Education: Aerobic Exercise & Resistance Training: - Gives group verbal and written instruction on the various components of exercise. Focuses on aerobic and resistive training programs and the benefits of this training and how to safely progress through these programs..   Cardiac Rehab from 05/15/2017 in [facility] Cardiac and Pulmonary Rehab  Date 03/13/17  Educator [facility]  Instruction Review Code 1- Verbalizes Understanding       Education: Exercise & Equipment Safety: - Individual verbal instruction and demonstration of equipment use and safety with use of the equipment.   Cardiac Rehab from 06/21/2019 in [facility] Cardiac and Pulmonary Rehab  Date 06/21/19  Educator [facility]  Instruction Review Code 1- Verbalizes Understanding      Education: Exercise Physiology & General Exercise Guidelines: - Group verbal and written instruction with models to review the exercise physiology of the cardiovascular system and associated critical values. Provides general exercise guidelines with specific guidelines to those with heart or lung disease.    Cardiac Rehab from 05/15/2017 in [facility] Cardiac and Pulmonary Rehab  Date 05/01/17  Educator [facility]  Instruction Review Code 1- Verbalizes Understanding      Education: Flexibility, Balance, Mind/Body Relaxation: Provides group verbal/written instruction on the benefits of flexibility and balance training, including mind/body exercise modes such as yoga, pilates and tai chi.  Demonstration and skill practice provided.   Cardiac Rehab from 05/15/2017 in [facility] Cardiac and Pulmonary Rehab  Date 05/08/17  Educator [facility]  Instruction Review Code 1- Verbalizes Understanding      Activity Barriers & Risk Stratification:  Activity Barriers & Cardiac Risk Stratification - 06/21/19 1445      Activity Barriers & Cardiac Risk Stratification   Activity Barriers Back Problems;Deconditioning;Muscular Weakness;Balance Concerns    Cardiac Risk Stratification High           6 Minute Walk:  6 Minute Walk    Row Name 06/21/19 1444         6 Minute Walk   Phase Initial     Distance 730 feet     Walk Time 6 minutes     MPH 1.38     METS 1.17     RPE 12     VO2 Peak 4.11     Symptoms No     Resting HR 56 bpm     Resting BP 136/64     Resting Oxygen Saturation  97 %     Exercise Oxygen Saturation  during 6 min walk 97 %     Max Ex. HR 87 bpm     Max Ex. BP 156/74     2 Minute Post BP 144/74             Oxygen Initial Assessment:   Oxygen Re-Evaluation:   Oxygen Discharge (Final Oxygen Re-Evaluation):   Initial Exercise Prescription:  Initial Exercise Prescription - 06/21/19 1400      Date of Initial Exercise RX and Referring Provider   Date 06/21/19    Referring Provider Ida Rogue MD      Treadmill   MPH 1.3    Grade 0    Minutes 15    METs 2  NuStep   Level 1    SPM 80    Minutes 15    METs 2      Biostep-RELP   Level 1    SPM 50    Minutes 15    METs 2      Prescription Details   Frequency (times per week) 3    Duration Progress to 30 minutes of continuous aerobic without signs/symptoms of physical distress      Intensity   THRR 40-80% of Max Heartrate 88-119    Ratings of Perceived Exertion 11-13    Perceived Dyspnea 0-4      Progression   Progression Continue to progress workloads to maintain intensity without signs/symptoms of physical distress.      Resistance Training   Training Prescription Yes    Weight 3 lb    Reps 10-15           Perform Capillary Blood Glucose checks as needed.  Exercise Prescription Changes:  Exercise Prescription Changes    Row Name 06/21/19 1400 07/06/19 1400 07/15/19 1400 07/28/19 0800 08/12/19 1100     Response to Exercise   Blood Pressure (Admit) 136/64 108/56 122/64 124/56 132/60   Blood Pressure (Exercise) 156/74 150/62 138/64 142/74 126/74   Blood Pressure (Exit) 144/74 122/62 120/60 128/64 124/64   Heart Rate (Admit) 86 bpm 73 bpm 66 bpm 68 bpm 85 bpm   Heart Rate (Exercise) 87 bpm 96 bpm 102 bpm 90 bpm 90 bpm   Heart Rate (Exit) 58 bpm 78 bpm 71 bpm 66 bpm 66 bpm   Oxygen Saturation (Admit) 97 % -- -- -- --   Oxygen Saturation (Exercise) 97 % -- -- -- --   Rating of Perceived Exertion (Exercise) 12 13 13 13 13   Symptoms fatigue at end none none none none   Comments walk test results -- -- -- --   Duration -- Continue with 30 min of aerobic exercise without signs/symptoms of  physical distress. Continue with 30 min of aerobic exercise without signs/symptoms of physical distress. Continue with 30 min of aerobic exercise without signs/symptoms of physical distress. Continue with 30 min of aerobic exercise without signs/symptoms of physical distress.   Intensity -- THRR unchanged THRR unchanged THRR unchanged THRR unchanged     Progression   Progression -- Continue to progress workloads to maintain intensity without signs/symptoms of physical distress. Continue to progress workloads to maintain intensity without signs/symptoms of physical distress. Continue to progress workloads to maintain intensity without signs/symptoms of physical distress. Continue to progress workloads to maintain intensity without signs/symptoms of physical distress.   Average METs -- 2.07 2.15 1.91 1.9     Resistance Training   Training Prescription -- Yes Yes Yes Yes   Weight -- 3 lb  3lb  3lb 3 lb   Reps -- 10-15 10-15 10-15 10-15     Interval Training   Interval Training -- No No No No     Treadmill   MPH -- 1.4 -- -- --   Grade -- 0 -- -- --   Minutes -- 15 -- -- --   METs -- 2.07 -- -- --     Recumbant Bike   Level -- -- -- 1 --   Watts -- -- -- 11 --   Minutes -- -- -- 15 --   METs -- -- -- 2.57 --     NuStep   Level -- 2 3 6 4   SPM -- -- 80 -- 80     Minutes -- _0 METs -- 2.3 2.3 1.6 1.9     Arm Ergometer   Level -- -- -- 1 --   Minutes -- -- -- 15 --   METs -- -- -- 1.5 --     Biostep-RELP   Level -- _1 --   SPM -- -- 50 -- --   Minutes -- _2 --   METs -- _3 --     Home Exercise Plan   Plans to continue exercise at -- -- -- Home (comment)  walking Home (comment)  walking   Frequency -- -- -- Add 3 additional days to program exercise sessions. Add 3 additional days to program exercise sessions.   Initial Home Exercises Provided -- -- -- 07/08/19 07/08/19          Exercise Comments:   Exercise Goals and Review:  Exercise Goals     Row Name 06/21/19 1447             Exercise Goals   Increase Physical Activity Yes       Intervention Provide advice, education, support and counseling about physical activity/exercise needs.;Develop an individualized exercise prescription for aerobic and resistive training based on initial evaluation findings, risk stratification, comorbidities and participant's personal goals.       Expected Outcomes Short Term: Attend rehab on a regular basis to increase amount of physical activity.;Long Term: Exercising regularly at least 3-5 days a week.;Long Term: Add in home exercise to make exercise part of routine and to increase amount of physical activity.       Increase Strength and Stamina Yes       Intervention Provide advice, education, support and counseling about physical activity/exercise needs.;Develop an individualized exercise prescription for aerobic and resistive training based on initial evaluation findings, risk stratification, comorbidities and participant's personal goals.       Expected Outcomes Short Term: Increase workloads from initial exercise prescription for resistance, speed, and METs.;Short Term: Perform resistance training exercises routinely during rehab and add in resistance training at home;Long Term: Improve cardiorespiratory fitness, muscular endurance and strength as measured by increased METs and functional capacity (6MWT)       Able to understand and use rate of perceived exertion (RPE) scale Yes       Intervention Provide education and explanation on how to use RPE scale       Expected Outcomes Short Term: Able to use RPE daily in rehab to express subjective intensity level;Long Term:  Able to use RPE to guide intensity level when exercising independently       Able to understand and use Dyspnea scale Yes       Intervention Provide education and explanation on how to use Dyspnea scale       Expected Outcomes Short Term: Able to use Dyspnea scale daily in rehab to  express subjective sense of shortness of breath during exertion;Long Term: Able to use Dyspnea scale to guide intensity level when exercising independently       Knowledge and understanding of Target Heart Rate Range (THRR) Yes       Intervention Provide education and explanation of THRR including how the numbers were predicted and where they are located for reference       Expected Outcomes Short Term: Able to state/look up THRR;Long Term: Able to use THRR to govern intensity when exercising independently;Short Term: Able to use daily as guideline for intensity in rehab  Able to check pulse independently Yes       Intervention Provide education and demonstration on how to check pulse in carotid and radial arteries.;Review the importance of being able to check your own pulse for safety during independent exercise       Expected Outcomes Short Term: Able to explain why pulse checking is important during independent exercise;Long Term: Able to check pulse independently and accurately       Understanding of Exercise Prescription Yes       Intervention Provide education, explanation, and written materials on patient's individual exercise prescription       Expected Outcomes Short Term: Able to explain program exercise prescription;Long Term: Able to explain home exercise prescription to exercise independently              Exercise Goals Re-Evaluation :  Exercise Goals Re-Evaluation    Row Name 06/23/19 1341 07/06/19 1411 07/08/19 1318 07/15/19 1415 07/28/19 0847     Exercise Goal Re-Evaluation   Exercise Goals Review Increase Physical Activity;Able to understand and use rate of perceived exertion (RPE) scale;Knowledge and understanding of Target Heart Rate Range (THRR);Understanding of Exercise Prescription;Increase Strength and Stamina;Able to check pulse independently Increase Physical Activity;Increase Strength and Stamina;Understanding of Exercise Prescription Increase Physical  Activity;Increase Strength and Stamina;Understanding of Exercise Prescription Increase Physical Activity;Increase Strength and Stamina;Able to understand and use rate of perceived exertion (RPE) scale;Able to understand and use Dyspnea scale;Knowledge and understanding of Target Heart Rate Range (THRR);Able to check pulse independently;Understanding of Exercise Prescription Increase Physical Activity;Increase Strength and Stamina;Understanding of Exercise Prescription   Comments Reviewed RPE scale, THR and program prescription with pt today.  Pt voiced understanding and was given a copy of goals to take home. Drina is off to a good start in rehab.  She is up to 2.3 METs on the NuStep.  We will start to increase her workloads and continue to monitor her progress. Ahriyah is glad to be back in rehab.  She is feeling pretty good and getting her stamina back. Reviewed home exercise with pt today.  Pt plans to walking and staff videos for exercise.  Reviewed THR, pulse, RPE, sign and symptoms, NTG use, and when to call 911 or MD.  Also discussed weather considerations and indoor options.  Pt voiced understanding. Nala has done well in her first weeks of rehab.  She has moved up her levels slightly on all machines.  Staff will monitor progress. Latarra continues to do well in rehab.  She is now on level 6 on the NuStep.  We have talked to her about maintaining her pace at 80 spm, but she forgets and we will continue to remind her.  We will continue to monitor her progress.   Expected Outcomes Short: Use RPE daily to regulate intensity. Long: Follow program prescription in THR. Short: Start to increase workloads  Long: Continue to improve stamina. Short: Start to add in exercise on off days from class  Long: Continue to improve stamina. Short : exercise consistently Long: improve MET level Short: Increase spm on steppers  Long; Continue to improve stamina.   Row Name 08/06/19 1013 08/12/19 1145           Exercise Goal  Re-Evaluation   Exercise Goals Review Increase Physical Activity;Increase Strength and Stamina;Understanding of Exercise Prescription Increase Physical Activity;Increase Strength and Stamina;Understanding of Exercise Prescription      Comments Matalie is doing well in rehab. She is also doing her leg exercises at home.  She   will walk in the house, but does not want to go out by herself.  We talked about walking at the store or mall where there are other people and benches to use.  Or her other option would be to return to the [facility] again. She is going to think about it. Overall, she is feeling better. Reve has moved to level 4 on NS.  She could not maintain 80 SPM at level 6.  Staff will encourage her to progress with strength and TM speed/grade.      Expected Outcomes Short: Find a safe place to walk where she feels more comfortable  Long: Continue to improve stamina. Short: coninue to work on walking Long: improve MET level             Discharge Exercise Prescription (Final Exercise Prescription Changes):  Exercise Prescription Changes - 08/12/19 1100      Response to Exercise   Blood Pressure (Admit) 132/60    Blood Pressure (Exercise) 126/74    Blood Pressure (Exit) 124/64    Heart Rate (Admit) 85 bpm    Heart Rate (Exercise) 90 bpm    Heart Rate (Exit) 66 bpm    Rating of Perceived Exertion (Exercise) 13    Symptoms none    Duration Continue with 30 min of aerobic exercise without signs/symptoms of physical distress.    Intensity THRR unchanged      Progression   Progression Continue to progress workloads to maintain intensity without signs/symptoms of physical distress.    Average METs 1.9      Resistance Training   Training Prescription Yes    Weight 3 lb    Reps 10-15      Interval Training   Interval Training No      NuStep   Level 4    SPM 80    Minutes 15    METs 1.9      Home Exercise Plan   Plans to continue exercise at Home (comment)   walking   Frequency  Add 3 additional days to program exercise sessions.    Initial Home Exercises Provided 07/08/19           Nutrition:  Target Goals: Understanding of nutrition guidelines, daily intake of sodium <156m, cholesterol <2051m calories 30% from fat and 7% or less from saturated fats, daily to have 5 or more servings of fruits and vegetables.  Education: Controlling Sodium/Reading Food Labels -Group verbal and written material supporting the discussion of sodium use in heart healthy nutrition. Review and explanation with models, verbal and written materials for utilization of the food label.   [facility]  Date 04/15/17  Educator CR  Instruction Review Code 1- VeUnited States Steel Corporationnderstanding      Education: General Nutrition Guidelines/Fats and Fiber: -Group instruction provided by verbal, written material, models and posters to present the general guidelines for heart healthy nutrition. Gives an explanation and review of dietary fats and fiber.   [facility]  Date 04/08/17  Educator CR  Instruction Review Code 1- Verbalizes Understanding      Biometrics:  Pre Biometrics - 06/21/19 1448      Pre Biometrics   Height 5' 2" (1.575 m)    Weight 133 lb 11.2 oz (60.6 kg)    BMI (Calculated) 24.45    Single Leg Stand 3.22 seconds            Nutrition Therapy  Plan and Nutrition Goals:  Nutrition Therapy & Goals - 08/16/19 1234      Nutrition Therapy   Diet Low Na, heart healthy    Drug/Food Interactions Statins/Certain Fruits    Protein (specify units) 45-50g    Fiber 25 grams    Whole Grain Foods 2 servings    Saturated Fats 12 max. grams    Fruits and Vegetables 4 servings/day    Sodium 1.5 grams      Personal Nutrition Goals   Nutrition Goal ST: try bottle water instead of salt treated water. LT: gain strength back in legs    Comments Bowl of cereal frosted flakes (lactaid  milk) with coffee and milk. L: goes out for lunch, likes apple jack chicken at [employer] with salad with mashed potatoes. Sweet tea - 2 glasses. D:sometimes leftovers or crackers. water and tea. Also will have boost during the day. Pt reports usisng salt to filter hard water and that is what she uses to make coffee and cook, thinks her BP is high - lunch is high in Na, but not unlikely a significant portion paired with the rest of the day to exceed recommendations. Discussed heart healty eating.           Nutrition Assessments:  Nutrition Assessments - 06/21/19 1449      MEDFICTS Scores   Pre Score 24           MEDIFICTS Score Key:          ?70 Need to make dietary changes          40-70 Heart Healthy Diet         ? 40 Therapeutic Level Cholesterol Diet  Nutrition Goals Re-Evaluation:   Nutrition Goals Discharge (Final Nutrition Goals Re-Evaluation):   Psychosocial: Target Goals: Acknowledge presence or absence of significant depression and/or stress, maximize coping skills, provide positive support system. Participant is able to verbalize types and ability to use techniques and skills needed for reducing stress and depression.   Education: Depression - Provides group verbal and written instruction on the correlation between heart/lung disease and depressed mood, treatment options, and the stigmas associated with seeking treatment.   Cardiac Rehab from 05/15/2017 in [facility] Cardiac and Pulmonary Rehab  Date 04/29/17  Educator KC  Instruction Review Code 1- Verbalizes Understanding      Education: Sleep Hygiene -Provides group verbal and written instruction about how sleep can affect your health.  Define sleep hygiene, discuss sleep cycles and impact of sleep habits. Review good sleep hygiene tips.    Cardiac Rehab from 05/15/2017 in [facility] Cardiac and Pulmonary Rehab  Date 04/10/17  Educator [facility]  Instruction Review Code 1- Verbalizes Understanding       Education: Stress and  Anxiety: - Provides group verbal and written instruction about the health risks of elevated stress and causes of high stress.  Discuss the correlation between heart/lung disease and anxiety and treatment options. Review healthy ways to manage with stress and anxiety.   Cardiac Rehab from 05/15/2017 in [facility] Cardiac and Pulmonary Rehab  Date 05/13/17  Educator KC  Instruction Review Code 1- Verbalizes Understanding       Initial Review & Psychosocial Screening:  Initial Psych Review & Screening - 06/17/19 1109      Initial Review   Current issues with None Identified      Family Dynamics   Good Support System? Yes   children     Barriers   Psychosocial barriers to participate in program   There are no identifiable barriers or psychosocial needs.;The patient should benefit from training in stress management and relaxation.      Screening Interventions   Expected Outcomes Short Term goal: Utilizing psychosocial counselor, staff and physician to assist with identification of specific Stressors or current issues interfering with healing process. Setting desired goal for each stressor or current issue identified.;Long Term Goal: Stressors or current issues are controlled or eliminated.;Short Term goal: Identification and review with participant of any Quality of Life or Depression concerns found by scoring the questionnaire.;Long Term goal: The participant improves quality of Life and PHQ9 Scores as seen by post scores and/or verbalization of changes           Quality of Life Scores:   Quality of Life - 06/21/19 1448      Quality of Life   Select Quality of Life      Quality of Life Scores   Health/Function Pre 27.43 %    Socioeconomic Pre 30 %    Psych/Spiritual Pre 30 %    Family Pre 30 %    GLOBAL Pre 28.8 %          Scores of 19 and below usually indicate a poorer quality of life in these areas.  A difference of  2-3 points is a clinically meaningful difference.  A difference of  2-3 points in the total score of the Quality of Life Index has been associated with significant improvement in overall quality of life, self-image, physical symptoms, and general health in studies assessing change in quality of life.  PHQ-9: Recent Review Flowsheet Data    Depression screen [facility] 2/9 06/21/2019 04/14/2019 04/08/2018 05/13/2017 03/31/2017   Decreased Interest 0 0 0 0 0   Down, Depressed, Hopeless 0 0 0 0 0   PHQ - 2 Score 0 0 0 0 0   Altered sleeping 0 0 0 0 0   Tired, decreased energy 1 0 0 0 0   Change in appetite 0 0 0 0 0   Feeling bad or failure about yourself  0 0 0 0 0   Trouble concentrating 0 0 0 0 0   Moving slowly or fidgety/restless 0 0 0 0 0   Suicidal thoughts 0 0 0 0 0   PHQ-9 Score 1 0 0 0 0   Difficult doing work/chores Somewhat difficult Not difficult at all Not difficult at all Not difficult at all Not difficult at all     Interpretation of Total Score  Total Score Depression Severity:  1-4 = Minimal depression, 5-9 = Mild depression, 10-14 = Moderate depression, 15-19 = Moderately severe depression, 20-27 = Severe depression   Psychosocial Evaluation and Intervention:  Psychosocial Evaluation - 06/17/19 1109      Psychosocial Evaluation & Interventions   Interventions Encouraged to exercise with the program and follow exercise prescription    Comments Ayaan reports doing well. She has a great support system of children and grandchildren. She states doing fine during the pandemic, staying home. She sometimes still struggles with appetite, but she is working on it. She wants to come to the program to get  stronger and feel like she has increased stamina    Expected Outcomes Short: attend Cardiac Rehab for exercise and education. Long: maintain positive self care habits.    Continue Psychosocial Services  Follow up required by staff           Psychosocial Re-Evaluation:  Psychosocial Re-Evaluation    [place] Name 07/08/19 1318  08/06/19 1015            Psychosocial Re-Evaluation   Current issues with None Identified Current Stress Concerns      Comments Deion is doing well mentally.  She continues to sleep well.  Her grandson has moved out since she was here last.  Overall she stays positive. Mahdiya is doing well mentally still. She is lonely so we talked about getting out to walk more and see other people.  She has started back to church now in person, so that is helping. She continues to sleep well.      Expected Outcomes Short: Continue to exercise for more stamina  Long: Continue to stay positive. --      Interventions Encouraged to attend Cardiac Rehabilitation for the exercise --      Continue Psychosocial Services  Follow up required by staff --             Psychosocial Discharge (Final Psychosocial Re-Evaluation):  Psychosocial Re-Evaluation - 08/06/19 1015      Psychosocial Re-Evaluation   Current issues with Current Stress Concerns    Comments Paislei is doing well mentally still. She is lonely so we talked about getting out to walk more and see other people.  She has started back to church now in person, so that is helping. She continues to sleep well.           Vocational Rehabilitation: Provide vocational rehab assistance to qualifying candidates.   Vocational Rehab Evaluation & Intervention:  Vocational Rehab - 06/17/19 1105      Initial Vocational Rehab Evaluation & Intervention   Assessment shows need for Vocational Rehabilitation No           Education: Education Goals: Education classes will be provided on a variety of topics geared toward better understanding of heart health and risk factor modification. Participant will state understanding/return demonstration of topics presented as noted by education test scores.  Learning Barriers/Preferences:  Learning Barriers/Preferences - 06/17/19 1105      Learning Barriers/Preferences   Learning Barriers Hearing    Learning Preferences None           General  Cardiac Education Topics:  AED/CPR: - Group verbal and written instruction with the use of models to demonstrate the basic use of the AED with the basic ABC's of resuscitation.   Cardiac Rehab from 05/15/2017 in [facility] Re[facility]  Date 04/22/17  Educator SB  Instruction Review Code 1- Verbalizes Understanding      Anatomy & Physiology of the Heart: - Group verbal and written instruction and models provide basic cardiac anatomy and physiology, with the coronary electrical and arterial systems. Review of Valvular disease and Heart Failure   Cardiac Rehab from 05/15/2017 in [facility]  Date 04/03/17  Educator CE  Instruction Review Code 1- Verbalizes Understanding      Cardiac Procedures: - Group verbal and written instruction to review commonly prescribed medications for heart disease. Reviews the medication, class of the drug, and side effects. Includes the steps to properly store meds and maintain the prescription regimen. (beta blockers and nitrates)   Cardiac Rehab from 05/15/2017 in [facility]  Date 04/17/17  Educator CE  Instruction Review Code 1- Verbalizes Understanding      Cardiac Medications I: - Group verbal and written instruction to review commonly prescribed medications for heart disease. Reviews the medication, class of the drug, and side effects. Includes the steps to properly   store meds and maintain the prescription regimen.   Cardiac Medications II: -Group verbal and written instruction to review commonly prescribed medications for heart disease. Reviews the medication, class of the drug, and side effects. (all other drug classes)   Cardiac Rehab from 05/15/2017 in [facility]  Date 05/15/17  [Risk Factors]  Educator JH  Instruction Review Code 1- Verbalizes Understanding       Go Sex-Intimacy & Heart Disease, Get SMART - Goal Setting: - Group verbal and written instruction through  game format to discuss heart disease and the return to sexual intimacy. Provides group verbal and written material to discuss and apply goal setting through the application of the S.M.A.R.T. Method.   Cardiac Rehab from 05/15/2017 in [facility]  Date 04/17/17  Educator CE  Instruction Review Code 1- Verbalizes Understanding      Other Matters of the Heart: - Provides group verbal, written materials and models to describe Stable Angina and Peripheral Artery. Includes description of the disease process and treatment options available to the cardiac patient.   Cardiac Rehab from 05/15/2017 in [facility]  Date 04/03/17  Educator CE  Instruction Review Code 1- Verbalizes Understanding      Infection Prevention: - Provides verbal and written material to individual with discussion of infection control including proper hand washing and proper equipment cleaning during exercise session.   Cardiac Rehab from 06/21/2019 in [facility]  Date 06/21/19  Educator JH  Instruction Review Code 1- Verbalizes Understanding      Falls Prevention: - Provides verbal and written material to individual with discussion of falls prevention and safety.   Cardiac Rehab from 06/21/2019 in [facility]  Date 06/21/19  Educator JH  Instruction Review Code 1- Verbalizes Understanding      Other: -Provides group and verbal instruction on various topics (see comments)   Knowledge Questionnaire Score:  Knowledge Questionnaire Score - 06/21/19 1449      Knowledge Questionnaire Score   Pre Score 22/26 Education Focus: HR, nutrition, exercise           Core Components/Risk Factors/Patient Goals at Admission:  Personal Goals and Risk Factors at Admission - 06/21/19 1449      Core Components/Risk Factors/Patient Goals on Admission    Weight Management Yes;Weight Maintenance    Intervention Weight Management: Develop a  combined nutrition and exercise program designed to reach desired caloric intake, while maintaining appropriate intake of nutrient and fiber, sodium and fats, and appropriate energy expenditure required for the weight goal.;Weight Management: Provide education and appropriate resources to help participant work on and attain dietary goals.    Admit Weight 133 lb 11.2 oz (60.6 kg)    Goal Weight: Short Term 133 lb (60.3 kg)    Goal Weight: Long Term 133 lb (60.3 kg)    Expected Outcomes Short Term: Continue to assess and modify interventions until short term weight is achieved;Long Term: Adherence to nutrition and physical activity/exercise program aimed toward attainment of established weight goal;Weight Maintenance: Understanding of the daily nutrition guidelines, which includes 25-35% calories from fat, 7% or less cal from saturated fats, less than 200mg cholesterol, less than 1.5gm of sodium, & 5 or more servings of fruits and vegetables daily    Hypertension Yes    Intervention Provide education on lifestyle modifcations including regular physical activity/exercise, weight management, moderate sodium restriction and increased consumption of fresh fruit, vegetables, and low fat dairy,   alcohol moderation, and smoking cessation.;Monitor prescription use compliance.    Expected Outcomes Short Term: Continued assessment and intervention until BP is < 140/90mm HG in hypertensive participants. < 130/80mm HG in hypertensive participants with diabetes, heart failure or chronic kidney disease.;Long Term: Maintenance of blood pressure at goal levels.    Lipids Yes    Intervention Provide education and support for participant on nutrition & aerobic/resistive exercise along with prescribed medications to achieve LDL <70mg, HDL >40mg.    Expected Outcomes Short Term: Participant states understanding of desired cholesterol values and is compliant with medications prescribed. Participant is following exercise  prescription and nutrition guidelines.;Long Term: Cholesterol controlled with medications as prescribed, with individualized exercise RX and with personalized nutrition plan. Value goals: LDL < 70mg, HDL > 40 mg.           Education:Diabetes - Individual verbal and written instruction to review signs/symptoms of diabetes, desired ranges of glucose level fasting, after meals and with exercise. Acknowledge that pre and post exercise glucose checks will be done for 3 sessions at entry of program.   Education: Know Your Numbers and Risk Factors: -Group verbal and written instruction about important numbers in your health.  Discussion of what are risk factors and how they play a role in the disease process.  Review of Cholesterol, Blood Pressure, Diabetes, and BMI and the role they play in your overall health.   Cardiac Rehab from 05/15/2017 in [facility]  Date 05/15/17  [Risk Factors]  Educator JH  Instruction Review Code 1- Verbalizes Understanding      Core Components/Risk Factors/Patient Goals Review:   Goals and Risk Factor Review    Row Name 07/08/19 1319 08/06/19 1034           Core Components/Risk Factors/Patient Goals Review   Personal Goals Review Weight Management/Obesity;Hypertension;Lipids Weight Management/Obesity;Hypertension;Lipids      Review Ahlia is off to a good start in rehab. Her weight is staying steady and doing well.  Her pressures have been doing okay since her medications changed.  She continues to check them at home. Dorothy is doing well.  Her weight is steady and she feels good where she is.  Her pressures have continued to do wel and she does monitor them on her own.      Expected Outcomes Short: Continue to monitor pressures  Long: Continue to monitor risk factors. Short:Continue to maintain weight  Long: Continue to monitor risk factors.             Core Components/Risk Factors/Patient Goals at Discharge (Final Review):   Goals and  Risk Factor Review - 08/06/19 1034      Core Components/Risk Factors/Patient Goals Review   Personal Goals Review Weight Management/Obesity;Hypertension;Lipids    Review Geraldean is doing well.  Her weight is steady and she feels good where she is.  Her pressures have continued to do wel and she does monitor them on her own.    Expected Outcomes Short:Continue to maintain weight  Long: Continue to monitor risk factors.           ITP Comments:  ITP Comments    Row Name 06/17/19 1108 06/21/19 1440 06/23/19 1340 06/30/19 0538 07/28/19 0553   ITP Comments Initial telephone encounter completed. Diagnosis can be found in [facility] 3/12. EP orientation scheduled for 4/12 at 11am Completed 6MWT and gym orientation.  Initial ITP created and sent for review to Dr. Mark Miller, Medical Director. First full day of exercise!  Patient   was oriented to gym and equipment including functions, settings, policies, and procedures.  Patient's individual exercise prescription and treatment plan were reviewed.  All starting workloads were established based on the results of the 6 minute walk test done at initial orientation visit.  The plan for exercise progression was also introduced and progression will be customized based on patient's performance and goals. 30 Day review completed. Medical Director review done, changes made as directed,and approval shown by signature of Market researcher.  New to program 30 Day review completed. ITP review done, changes made as directed,and approval shown by signature of  Scientist, research (life sciences).   [place] Name 08/25/19 0544           ITP Comments 30 Day review completed. Medical Director ITP review done, changes made as directed, and signed approval by Medical Director.              Comments: 30 Day review completed. Medical Director ITP review done, changes made as directed, and signed approval by Medical Director.

## 2019-08-25 NOTE — Progress Notes (Signed)
Daily Session Note  Patient Details  Name: Jenna Young MRN: [id number] Date of Birth: Sep 02, 1931 Referring Provider:     Cardiac Rehab from 06/21/2019 in [facility] Cardiac [facility] Re[facility]  Referring Provider Ida Rogue MD      Encounter Date: 08/25/2019  Check In:  Session Check In - 08/25/19 1258      Check-In   Supervising physician immediately available to respond to emergencies See telemetry face sheet for immediately available ER MD    Location [facility] & [facility] Re[facility]    Staff Present Renita Papa, RN BSN;Melissa Caiola RDN, Rowe Pavy, BA, ACSM CEP, Exercise Physiologist    Virtual Visit No    Medication changes reported     No    Fall or balance concerns reported    No    Warm-up and Cool-down Performed on first and last piece of equipment    Resistance Training Performed Yes    VAD Patient? No    PAD/SET Patient? No      Pain Assessment   Currently in Pain? No/denies              Social History   Tobacco Use  Smoking Status Former Smoker  . Quit date: 03/11/1968  . Years since quitting: 51.4  Smokeless Tobacco Never Used    Goals Met:  Independence with exercise equipment Exercise tolerated well No report of cardiac concerns or symptoms Strength training completed today  Goals Unmet:  Not Applicable  Comments: Pt able to follow exercise prescription today without complaint.  Will continue to monitor for progression.    Dr. Emily Filbert is Medical Director for [place] and [facility].

## 2019-08-26 ENCOUNTER — Other Ambulatory Visit: Payer: Self-pay

## 2019-08-26 ENCOUNTER — Encounter: Payer: Medicare Other | Admitting: *Deleted

## 2019-08-26 DIAGNOSIS — I252 Old myocardial infarction: Secondary | ICD-10-CM | POA: Diagnosis not present

## 2019-08-26 DIAGNOSIS — I214 Non-ST elevation (NSTEMI) myocardial infarction: Secondary | ICD-10-CM

## 2019-08-26 NOTE — Progress Notes (Signed)
Daily Session Note  Patient Details  Name: Jenna Young MRN: [id number] Date of Birth: 19-May-1931 Referring Provider:     Cardiac Rehab from 06/21/2019 in [facility] Re[facility]  Referring Provider Ida Rogue MD      Encounter Date: 08/26/2019  Check In:  Session Check In - 08/26/19 1307      Check-In   Supervising physician immediately available to respond to emergencies See telemetry face sheet for immediately available ER MD    Location [facility]    Staff Present Renita Papa, RN BSN;Joseph [address] [place], [place], [place], CCRP, CCET    Virtual Visit No    Medication changes reported     No    Fall or balance concerns reported    No    Warm-up and Cool-down Performed on first and last piece of equipment    Resistance Training Performed Yes    VAD Patient? No    PAD/SET Patient? No      Pain Assessment   Currently in Pain? No/denies              Social History   Tobacco Use  Smoking Status Former Smoker  . Quit date: 03/11/1968  . Years since quitting: 51.4  Smokeless Tobacco Never Used    Goals Met:  Independence with exercise equipment Exercise tolerated well No report of cardiac concerns or symptoms Strength training completed today  Goals Unmet:  Not Applicable  Comments: Pt able to follow exercise prescription today without complaint.  Will continue to monitor for progression.    Dr. Emily Filbert is Medical Director for [place] and [facility].

## 2019-08-30 ENCOUNTER — Encounter: Payer: Medicare Other | Admitting: *Deleted

## 2019-08-30 ENCOUNTER — Other Ambulatory Visit: Payer: Self-pay

## 2019-08-30 DIAGNOSIS — I214 Non-ST elevation (NSTEMI) myocardial infarction: Secondary | ICD-10-CM

## 2019-08-30 DIAGNOSIS — I252 Old myocardial infarction: Secondary | ICD-10-CM | POA: Diagnosis not present

## 2019-08-30 NOTE — Progress Notes (Signed)
Daily Session Note  Patient Details  Name: Jenna Young MRN: [id number] Date of Birth: 27-May-1931 Referring Provider:     Cardiac Rehab from 06/21/2019 in [facility] Cardiac and Pulmonary Rehab  Referring Provider Ida Rogue MD      Encounter Date: 08/30/2019  Check In:  Session Check In - 08/30/19 1300      Check-In   Supervising physician immediately available to respond to emergencies See telemetry face sheet for immediately available ER MD    Location [facility]    Staff Present Renita Papa, RN BSN;Joseph Hood RCP,RRT,BSRT;Melissa [place] RDN, LDN    Virtual Visit No    Medication changes reported     No    Fall or balance concerns reported    No    Warm-up and Cool-down Performed on first and last piece of equipment    Resistance Training Performed Yes    VAD Patient? No    PAD/SET Patient? No      Pain Assessment   Currently in Pain? No/denies              Social History   Tobacco Use  Smoking Status Former Smoker  . Quit date: 03/11/1968  . Years since quitting: 51.5  Smokeless Tobacco Never Used    Goals Met:  Independence with exercise equipment Exercise tolerated well No report of cardiac concerns or symptoms Strength training completed today  Goals Unmet:  Not Applicable  Comments: Pt able to follow exercise prescription today without complaint.  Will continue to monitor for progression.    Dr. Emily Filbert is Medical Director for [place] and [facility].

## 2019-09-01 ENCOUNTER — Encounter: Payer: Medicare Other | Admitting: *Deleted

## 2019-09-01 ENCOUNTER — Other Ambulatory Visit: Payer: Self-pay

## 2019-09-01 DIAGNOSIS — I252 Old myocardial infarction: Secondary | ICD-10-CM | POA: Diagnosis not present

## 2019-09-01 DIAGNOSIS — I214 Non-ST elevation (NSTEMI) myocardial infarction: Secondary | ICD-10-CM

## 2019-09-01 NOTE — Progress Notes (Signed)
Daily Session Note  Patient Details  Name: Jenna Young MRN: [id number] Date of Birth: 1931/11/12 Referring Provider:     Cardiac Rehab from 06/21/2019 in [facility] Rehab  Referring Provider Ida Rogue MD      Encounter Date: 09/01/2019  Check In:  Session Check In - 09/01/19 1259      Check-In   Supervising physician immediately available to respond to emergencies See telemetry face sheet for immediately available ER MD    Location [facility]    Staff Present Renita Papa, RN BSN;Melissa Caiola RDN, LDN;Jessica Luan Pulling, MA, RCEP, CCRP, CCET    Virtual Visit No    Medication changes reported     No    Fall or balance concerns reported    No    Warm-up and Cool-down Performed on first and last piece of equipment    Resistance Training Performed Yes    VAD Patient? No    PAD/SET Patient? No      Pain Assessment   Currently in Pain? No/denies              Social History   Tobacco Use  Smoking Status Former Smoker  . Quit date: 03/11/1968  . Years since quitting: 51.5  Smokeless Tobacco Never Used    Goals Met:  Independence with exercise equipment Exercise tolerated well No report of cardiac concerns or symptoms Strength training completed today  Goals Unmet:  Not Applicable  Comments: Pt able to follow exercise prescription today without complaint.  Will continue to monitor for progression.    Dr. Emily Filbert is Medical Director for [place] and [facility].

## 2019-09-02 ENCOUNTER — Encounter: Payer: Medicare Other | Admitting: *Deleted

## 2019-09-02 ENCOUNTER — Other Ambulatory Visit: Payer: Self-pay

## 2019-09-02 DIAGNOSIS — I214 Non-ST elevation (NSTEMI) myocardial infarction: Secondary | ICD-10-CM

## 2019-09-02 DIAGNOSIS — I252 Old myocardial infarction: Secondary | ICD-10-CM | POA: Diagnosis not present

## 2019-09-02 NOTE — Progress Notes (Signed)
Daily Session Note  Patient Details  Name: Jenna Young MRN: [id number] Date of Birth: 23-Feb-1932 Referring Provider:     Cardiac Rehab from 06/21/2019 in [facility] Cardiac and Pulmonary Rehab  Referring Provider Ida Rogue MD      Encounter Date: 09/02/2019  Check In:  Session Check In - 09/02/19 1301      Check-In   Supervising physician immediately available to respond to emergencies See telemetry face sheet for immediately available ER MD    Staff Present Renita Papa, RN BSN;Joseph [address] [place], [place], RCEP, CCRP, CCET    Virtual Visit No    Medication changes reported     No    Fall or balance concerns reported    No    Warm-up and Cool-down Performed on first and last piece of equipment    Resistance Training Performed Yes    VAD Patient? No    PAD/SET Patient? No      Pain Assessment   Currently in Pain? No/denies              Social History   Tobacco Use  Smoking Status Former Smoker  . Quit date: 03/11/1968  . Years since quitting: 51.5  Smokeless Tobacco Never Used    Goals Met:  Independence with exercise equipment Exercise tolerated well No report of cardiac concerns or symptoms Strength training completed today  Goals Unmet:  Not Applicable  Comments: Pt able to follow exercise prescription today without complaint.  Will continue to monitor for progression.    Dr. Emily Filbert is Medical Director for [place] and [facility].

## 2019-09-06 ENCOUNTER — Other Ambulatory Visit: Payer: Self-pay

## 2019-09-06 ENCOUNTER — Encounter: Payer: Medicare Other | Admitting: *Deleted

## 2019-09-06 DIAGNOSIS — I252 Old myocardial infarction: Secondary | ICD-10-CM | POA: Diagnosis not present

## 2019-09-06 DIAGNOSIS — I214 Non-ST elevation (NSTEMI) myocardial infarction: Secondary | ICD-10-CM

## 2019-09-06 NOTE — Progress Notes (Signed)
Daily Session Note  Patient Details  Name: Jenna Young MRN: [id number] Date of Birth: 1931-05-26 Referring Provider:     Cardiac Rehab from 06/21/2019 in [facility] Cardiac and Pulmonary Rehab  Referring Provider Ida Rogue MD      Encounter Date: 09/06/2019  Check In:  Session Check In - 09/06/19 1319      Check-In   Supervising physician immediately available to respond to emergencies See telemetry face sheet for immediately available ER MD    Location [facility]    Staff Present Renita Papa, RN BSN;Jessica Luan Pulling, MA, RCEP, CCRP, CCET    Virtual Visit No    Medication changes reported     No    Fall or balance concerns reported    No    Warm-up and Cool-down Performed on first and last piece of equipment    Resistance Training Performed Yes    VAD Patient? No    PAD/SET Patient? No      Pain Assessment   Currently in Pain? No/denies              Social History   Tobacco Use  Smoking Status Former Smoker  . Quit date: 03/11/1968  . Years since quitting: 51.5  Smokeless Tobacco Never Used    Goals Met:  Independence with exercise equipment Exercise tolerated well No report of cardiac concerns or symptoms Strength training completed today  Goals Unmet:  Not Applicable  Comments: Pt able to follow exercise prescription today without complaint.  Will continue to monitor for progression.    Dr. Emily Filbert is Medical Director for [place] and [facility].

## 2019-09-07 ENCOUNTER — Other Ambulatory Visit: Payer: Self-pay | Admitting: Family Medicine

## 2019-09-07 DIAGNOSIS — Z9889 Other specified postprocedural states: Secondary | ICD-10-CM

## 2019-09-08 ENCOUNTER — Other Ambulatory Visit: Payer: Self-pay

## 2019-09-08 ENCOUNTER — Encounter: Payer: Medicare Other | Admitting: *Deleted

## 2019-09-08 DIAGNOSIS — I214 Non-ST elevation (NSTEMI) myocardial infarction: Secondary | ICD-10-CM

## 2019-09-08 DIAGNOSIS — I252 Old myocardial infarction: Secondary | ICD-10-CM | POA: Diagnosis not present

## 2019-09-08 NOTE — Progress Notes (Signed)
Daily Session Note  Patient Details  Name: Jenna Young MRN: [id number] Date of Birth: 05/21/1931 Referring Provider:     Cardiac Rehab from 06/21/2019 in [facility] Re[facility]  Referring Provider Gollan, Timothy MD      Encounter Date: 09/08/2019  Check In:  Session Check In - 09/08/19 1304      Check-In   Supervising physician immediately available to respond to emergencies See telemetry face sheet for immediately available ER MD    Location [facility]    Staff Present Meredith Craven, RN BSN;Jessica Hawkins, MA, RCEP, CCRP, CCET;Laureen Brown, BS, RRT, CPFT    Virtual Visit No    Medication changes reported     No    Fall or balance concerns reported    No    Warm-up and Cool-down Performed on first and last piece of equipment    Resistance Training Performed Yes    VAD Patient? No    PAD/SET Patient? No      Pain Assessment   Currently in Pain? No/denies              Social History   Tobacco Use  Smoking Status Former Smoker  . Quit date: 03/11/1968  . Years since quitting: 51.5  Smokeless Tobacco Never Used    Goals Met:  Independence with exercise equipment Exercise tolerated well No report of cardiac concerns or symptoms Strength training completed today  Goals Unmet:  Not Applicable  Comments: Pt able to follow exercise prescription today without complaint.  Will continue to monitor for progression.    Dr. Mark Miller is Medical Director for [facility] and [facility]. 

## 2019-09-09 ENCOUNTER — Other Ambulatory Visit: Payer: Self-pay

## 2019-09-09 ENCOUNTER — Encounter: Payer: Medicare Other | Attending: Cardiovascular Disease | Admitting: *Deleted

## 2019-09-09 DIAGNOSIS — I252 Old myocardial infarction: Secondary | ICD-10-CM | POA: Insufficient documentation

## 2019-09-09 DIAGNOSIS — I214 Non-ST elevation (NSTEMI) myocardial infarction: Secondary | ICD-10-CM

## 2019-09-09 NOTE — Progress Notes (Signed)
Daily Session Note  Patient Details  Name: Jenna Young MRN: [id number] Date of Birth: 1931-10-17 Referring Provider:     Cardiac Rehab from 06/21/2019 in [facility] Cardiac [facility] Pulmonary Rehab  Referring Provider Ida Rogue MD      Encounter Date: 09/09/2019  Check In:  Session Check In - 09/09/19 1303      Check-In   Supervising physician immediately available to respond to emergencies See telemetry face sheet for immediately available ER MD    Location [facility]-Cardiac & Pulmonary Rehab    Staff Present Renita Papa, RN BSN;Joseph [address] [place], [place], [place], CCRP, Marylynn Pearson, [place] Exercise Physiologist    Virtual Visit No    Medication changes reported     No    Fall or balance concerns reported    No    Warm-up and Cool-down Performed on first and last piece of equipment    Resistance Training Performed Yes    VAD Patient? No    PAD/SET Patient? No      Pain Assessment   Currently in Pain? No/denies              Social History   Tobacco Use  Smoking Status Former Smoker  . Quit date: 03/11/1968  . Years since quitting: 51.5  Smokeless Tobacco Never Used    Goals Met:  Independence with exercise equipment Exercise tolerated well No report of cardiac concerns or symptoms Strength training completed today  Goals Unmet:  Not Applicable  Comments: Pt able to follow exercise prescription today without complaint.  Will continue to monitor for progression.    Dr. Emily Filbert is Medical Director for [place] and [facility].

## 2019-09-14 ENCOUNTER — Other Ambulatory Visit: Payer: Self-pay | Admitting: Cardiovascular Disease

## 2019-09-16 ENCOUNTER — Encounter: Payer: Medicare Other | Admitting: *Deleted

## 2019-09-16 ENCOUNTER — Other Ambulatory Visit: Payer: Self-pay

## 2019-09-16 DIAGNOSIS — I214 Non-ST elevation (NSTEMI) myocardial infarction: Secondary | ICD-10-CM

## 2019-09-16 DIAGNOSIS — I252 Old myocardial infarction: Secondary | ICD-10-CM | POA: Diagnosis not present

## 2019-09-16 NOTE — Progress Notes (Signed)
Daily Session Note  Patient Details  Name: Jenna Young MRN: [id number] Date of Birth: [birth year] Referring Provider:     Cardiac Rehab from 06/21/2019 in [facility] Cardiac and Pulmonary Rehab  Referring Provider Ida Rogue MD      Encounter Date: 09/16/2019  Check In:  Session Check In - 09/16/19 1337      Check-In   Supervising physician immediately available to respond to emergencies See telemetry face sheet for immediately available ER MD    Location [facility]-Cardiac & Pulmonary Rehab    Staff Present Renita Papa, RN BSN;Joseph [address] [place], [place], [place], CCRP, [place], [place], ACSM CEP, Exercise Physiologist    Virtual Visit No    Medication changes reported     No    Fall or balance concerns reported    No    Warm-up and Cool-down Performed on first and last piece of equipment    Resistance Training Performed Yes    VAD Patient? No    PAD/SET Patient? No      Pain Assessment   Currently in Pain? No/denies              Social History   Tobacco Use  Smoking Status Former Smoker  . Quit date: 03/11/1968  . Years since quitting: 51.5  Smokeless Tobacco Never Used    Goals Met:  Independence with exercise equipment Exercise tolerated well No report of cardiac concerns or symptoms Strength training completed today  Goals Unmet:  Not Applicable  Comments: Pt able to follow exercise prescription today without complaint.  Will continue to monitor for progression.'    Dr. Emily Filbert is Medical Director for [place] and [facility].

## 2019-09-20 ENCOUNTER — Other Ambulatory Visit: Payer: Self-pay

## 2019-09-20 ENCOUNTER — Encounter: Payer: Medicare Other | Admitting: *Deleted

## 2019-09-20 DIAGNOSIS — I252 Old myocardial infarction: Secondary | ICD-10-CM | POA: Diagnosis not present

## 2019-09-20 DIAGNOSIS — I214 Non-ST elevation (NSTEMI) myocardial infarction: Secondary | ICD-10-CM

## 2019-09-20 NOTE — Progress Notes (Signed)
Daily Session Note  Patient Details  Name: Nohealani Medinger MRN: [id number] Date of Birth: December 12, 1931 Referring Provider:     Cardiac Rehab from 06/21/2019 in [facility] Cardiac and Pulmonary Rehab  Referring Provider Ida Rogue MD      Encounter Date: 09/20/2019  Check In:  Session Check In - 09/20/19 1343      Check-In   Supervising physician immediately available to respond to emergencies See telemetry face sheet for immediately available ER MD    Location [facility]-Cardiac & Pulmonary Rehab    Staff Present Renita Papa, RN Margurite Auerbach, MS Exercise Physiologist;Kelly Amedeo Plenty, BS, ACSM CEP, Exercise Physiologist    Virtual Visit No    Medication changes reported     No    Fall or balance concerns reported    No    Warm-up and Cool-down Performed on first and last piece of equipment    Resistance Training Performed Yes    VAD Patient? No    PAD/SET Patient? No      Pain Assessment   Currently in Pain? No/denies              Social History   Tobacco Use  Smoking Status Former Smoker  . Quit date: 03/11/1968  . Years since quitting: 51.5  Smokeless Tobacco Never Used    Goals Met:  Independence with exercise equipment Exercise tolerated well No report of cardiac concerns or symptoms Strength training completed today  Goals Unmet:  Not Applicable  Comments: Pt able to follow exercise prescription today without complaint.  Will continue to monitor for progression.    Dr. Emily Filbert is Medical Director for [place] and [facility].

## 2019-09-22 ENCOUNTER — Encounter: Payer: Medicare Other | Admitting: *Deleted

## 2019-09-22 ENCOUNTER — Encounter: Payer: Self-pay | Admitting: *Deleted

## 2019-09-22 ENCOUNTER — Other Ambulatory Visit: Payer: Self-pay

## 2019-09-22 DIAGNOSIS — I214 Non-ST elevation (NSTEMI) myocardial infarction: Secondary | ICD-10-CM

## 2019-09-22 DIAGNOSIS — I252 Old myocardial infarction: Secondary | ICD-10-CM | POA: Diagnosis not present

## 2019-09-22 NOTE — Patient Instructions (Signed)
Discharge Patient Instructions  Patient Details  Name: Jenna Young MRN: [id number] Date of Birth: [birth year] Referring Provider:  Minna Merritts, MD   Number of Visits: 69  Reason for Discharge:  Patient reached a stable level of exercise. Patient independent in their exercise. Patient has met program and personal goals.  Smoking History:  Social History   Tobacco Use  Smoking Status Former Smoker  . Quit date: 03/11/1968  . Years since quitting: 51.5  Smokeless Tobacco Never Used    Diagnosis:  NSTEMI (non-ST elevated myocardial infarction) [facility])  Initial Exercise Prescription:  Initial Exercise Prescription - 06/21/19 1400      Date of Initial Exercise RX and Referring Provider   Date 06/21/19    Referring Provider Ida Rogue MD      Treadmill   MPH 1.3    Grade 0    Minutes 15    METs 2      NuStep   Level 1    SPM 80    Minutes 15    METs 2      Biostep-RELP   Level 1    SPM 50    Minutes 15    METs 2      Prescription Details   Frequency (times per week) 3    Duration Progress to 30 minutes of continuous aerobic without signs/symptoms of physical distress      Intensity   THRR 40-80% of Max Heartrate 88-119    Ratings of Perceived Exertion 11-13    Perceived Dyspnea 0-4      Progression   Progression Continue to progress workloads to maintain intensity without signs/symptoms of physical distress.      Resistance Training   Training Prescription Yes    Weight 3 lb    Reps 10-15           Discharge Exercise Prescription (Final Exercise Prescription Changes):  Exercise Prescription Changes - 09/20/19 1500      Response to Exercise   Blood Pressure (Admit) 120/62    Blood Pressure (Exercise) 146/64    Blood Pressure (Exit) 124/60    Heart Rate (Admit) 72 bpm    Heart Rate (Exercise) 97 bpm    Heart Rate (Exit) 70 bpm    Rating of Perceived Exertion (Exercise) 13    Symptoms none    Duration Continue with 30 min of  aerobic exercise without signs/symptoms of physical distress.    Intensity THRR unchanged      Progression   Progression Continue to progress workloads to maintain intensity without signs/symptoms of physical distress.    Average METs 2.15      Resistance Training   Training Prescription Yes    Weight 3 lb    Reps 10-15      Interval Training   Interval Training No      Treadmill   MPH 1.6    Grade 0.5    Minutes 15    METs 2.32      NuStep   Level 4    Minutes 15    METs 2.7      T5 Nustep   Level 3    Minutes 15    METs 1.6      Biostep-RELP   Level 3    Minutes 15    METs 2      Home Exercise Plan   Plans to continue exercise at Home (comment)   walking   Frequency Add 3 additional days to program exercise  sessions.    Initial Home Exercises Provided 07/08/19           Functional Capacity:  6 Minute Walk    Row Name 06/21/19 1444 09/20/19 1608       6 Minute Walk   Phase Initial Discharge    Distance 730 feet 900 feet    Distance % Change -- 23 %    Distance Feet Change -- 170 ft    Walk Time 6 minutes 6 minutes    # of Rest Breaks -- 0    MPH 1.38 1.7    METS 1.17 1.64    RPE 12 18    VO2 Peak 4.11 5.75    Symptoms No No    Resting HR 56 bpm 73 bpm    Resting BP 136/64 130/80    Resting Oxygen Saturation  97 % --    Exercise Oxygen Saturation  during 6 min walk 97 % --    Max Ex. HR 87 bpm 101 bpm    Max Ex. BP 156/74 154/56    2 Minute Post BP 144/74 --           Quality of Life:  Quality of Life - 09/20/19 1441      Quality of Life   Select Quality of Life      Quality of Life Scores   Health/Function Pre 27.43 %    Health/Function Post 24.39 %    Health/Function % Change -11.08 %    Socioeconomic Pre 30 %    Socioeconomic Post 30 %    Socioeconomic % Change  0 %    Psych/Spiritual Pre 30 %    Psych/Spiritual Post 24.86 %    Psych/Spiritual % Change -17.13 %    Family Pre 30 %    Family Post 30 %    Family % Change 0 %     GLOBAL Pre 28.8 %    GLOBAL Post 26.31 %    GLOBAL % Change -8.65 %           Personal Goals: Goals established at orientation with interventions provided to work toward goal.  Personal Goals and Risk Factors at Admission - 06/21/19 1449      Core Components/Risk Factors/Patient Goals on Admission    Weight Management Yes;Weight Maintenance    Intervention Weight Management: Develop a combined nutrition and exercise program designed to reach desired caloric intake, while maintaining appropriate intake of nutrient and fiber, sodium and fats, and appropriate energy expenditure required for the weight goal.;Weight Management: Provide education and appropriate resources to help participant work on and attain dietary goals.    Admit Weight 133 lb 11.2 oz (60.6 kg)    Goal Weight: Short Term 133 lb (60.3 kg)    Goal Weight: Long Term 133 lb (60.3 kg)    Expected Outcomes Short Term: Continue to assess and modify interventions until short term weight is achieved;Long Term: Adherence to nutrition and physical activity/exercise program aimed toward attainment of established weight goal;Weight Maintenance: Understanding of the daily nutrition guidelines, which includes 25-35% calories from fat, 7% or less cal from saturated fats, less than 280m cholesterol, less than 1.5gm of sodium, & 5 or more servings of fruits and vegetables daily    Hypertension Yes    Intervention Provide education on lifestyle modifcations including regular physical activity/exercise, weight management, moderate sodium restriction and increased consumption of fresh fruit, vegetables, and low fat dairy, alcohol moderation, and smoking cessation.;Monitor prescription use compliance.  Expected Outcomes Short Term: Continued assessment and intervention until BP is < 140/50m HG in hypertensive participants. < 130/834mHG in hypertensive participants with diabetes, heart failure or chronic kidney disease.;Long Term: Maintenance of  blood pressure at goal levels.    Lipids Yes    Intervention Provide education and support for participant on nutrition & aerobic/resistive exercise along with prescribed medications to achieve LDL <7058mHDL >19m72m  Expected Outcomes Short Term: Participant states understanding of desired cholesterol values and is compliant with medications prescribed. Participant is following exercise prescription and nutrition guidelines.;Long Term: Cholesterol controlled with medications as prescribed, with individualized exercise RX and with personalized nutrition plan. Value goals: LDL < 70mg32mL > 40 mg.            Personal Goals Discharge:  Goals and Risk Factor Review - 08/30/19 1316      Core Components/Risk Factors/Patient Goals Review   Personal Goals Review Weight Management/Obesity;Hypertension;Lipids    Review BettyRaeaes she is taking all meds as directed.  She does check BP at home when she doesnt come to HT.  She can tell she has more strength, especially in her legs.    Expected Outcomes Short: continue to exercise and maintain weight Long: manage risk factors           Exercise Goals and Review:  Exercise Goals    Row Name 06/21/19 1447             Exercise Goals   Increase Physical Activity Yes       Intervention Provide advice, education, support and counseling about physical activity/exercise needs.;Develop an individualized exercise prescription for aerobic and resistive training based on initial evaluation findings, risk stratification, comorbidities and participant's personal goals.       Expected Outcomes Short Term: Attend rehab on a regular basis to increase amount of physical activity.;Long Term: Exercising regularly at least 3-5 days a week.;Long Term: Add in home exercise to make exercise part of routine and to increase amount of physical activity.       Increase Strength and Stamina Yes       Intervention Provide advice, education, support and counseling about  physical activity/exercise needs.;Develop an individualized exercise prescription for aerobic and resistive training based on initial evaluation findings, risk stratification, comorbidities and participant's personal goals.       Expected Outcomes Short Term: Increase workloads from initial exercise prescription for resistance, speed, and METs.;Short Term: Perform resistance training exercises routinely during rehab and add in resistance training at home;Long Term: Improve cardiorespiratory fitness, muscular endurance and strength as measured by increased METs and functional capacity (6MWT)       Able to understand and use rate of perceived exertion (RPE) scale Yes       Intervention Provide education and explanation on how to use RPE scale       Expected Outcomes Short Term: Able to use RPE daily in rehab to express subjective intensity level;Long Term:  Able to use RPE to guide intensity level when exercising independently       Able to understand and use Dyspnea scale Yes       Intervention Provide education and explanation on how to use Dyspnea scale       Expected Outcomes Short Term: Able to use Dyspnea scale daily in rehab to express subjective sense of shortness of breath during exertion;Long Term: Able to use Dyspnea scale to guide intensity level when exercising independently       Knowledge  and understanding of Target Heart Rate Range (THRR) Yes       Intervention Provide education and explanation of THRR including how the numbers were predicted and where they are located for reference       Expected Outcomes Short Term: Able to state/look up THRR;Long Term: Able to use THRR to govern intensity when exercising independently;Short Term: Able to use daily as guideline for intensity in rehab       Able to check pulse independently Yes       Intervention Provide education and demonstration on how to check pulse in carotid and radial arteries.;Review the importance of being able to check your own  pulse for safety during independent exercise       Expected Outcomes Short Term: Able to explain why pulse checking is important during independent exercise;Long Term: Able to check pulse independently and accurately       Understanding of Exercise Prescription Yes       Intervention Provide education, explanation, and written materials on patient's individual exercise prescription       Expected Outcomes Short Term: Able to explain program exercise prescription;Long Term: Able to explain home exercise prescription to exercise independently              Exercise Goals Re-Evaluation:  Exercise Goals Re-Evaluation    Row Name 06/23/19 1341 07/06/19 1411 07/08/19 1318 07/15/19 1415 07/28/19 0847     Exercise Goal Re-Evaluation   Exercise Goals Review Increase Physical Activity;Able to understand and use rate of perceived exertion (RPE) scale;Knowledge and understanding of Target Heart Rate Range (THRR);Understanding of Exercise Prescription;Increase Strength and Stamina;Able to check pulse independently Increase Physical Activity;Increase Strength and Stamina;Understanding of Exercise Prescription Increase Physical Activity;Increase Strength and Stamina;Understanding of Exercise Prescription Increase Physical Activity;Increase Strength and Stamina;Able to understand and use rate of perceived exertion (RPE) scale;Able to understand and use Dyspnea scale;Knowledge and understanding of Target Heart Rate Range (THRR);Able to check pulse independently;Understanding of Exercise Prescription Increase Physical Activity;Increase Strength and Stamina;Understanding of Exercise Prescription   Comments Reviewed RPE scale, THR and program prescription with pt today.  Pt voiced understanding and was given a copy of goals to take home. Sarajean is off to a good start in rehab.  She is up to 2.3 METs on the NuStep.  We will start to increase her workloads and continue to monitor her progress. Pauletta is glad to be back in  rehab.  She is feeling pretty good and getting her stamina back. Reviewed home exercise with pt today.  Pt plans to walking and staff videos for exercise.  Reviewed THR, pulse, RPE, sign and symptoms, NTG use, and when to call 911 or MD.  Also discussed weather considerations and indoor options.  Pt voiced understanding. Avyanna has done well in her first weeks of rehab.  She has moved up her levels slightly on all machines.  Staff will monitor progress. Miguelina continues to do well in rehab.  She is now on level 6 on the NuStep.  We have talked to her about maintaining her pace at 33 spm, but she forgets and we will continue to remind her.  We will continue to monitor her progress.   Expected Outcomes Short: Use RPE daily to regulate intensity. Long: Follow program prescription in THR. Short: Start to increase workloads  Long: Continue to improve stamina. Short: Start to add in exercise on off days from class  Long: Continue to improve stamina. Short : exercise consistently Long: improve MET level Short:  Increase spm on steppers  Long; Continue to improve stamina.   [place] Name 08/06/19 1013 08/12/19 1145 08/26/19 1543 08/30/19 1318 09/08/19 1451     Exercise Goal Re-Evaluation   Exercise Goals Review Increase Physical Activity;Increase Strength and Stamina;Understanding of Exercise Prescription Increase Physical Activity;Increase Strength and Stamina;Understanding of Exercise Prescription Increase Physical Activity;Increase Strength and Stamina;Understanding of Exercise Prescription Increase Physical Activity;Increase Strength and Stamina;Understanding of Exercise Prescription Increase Physical Activity;Increase Strength and Stamina;Understanding of Exercise Prescription   Comments Nadiah is doing well in rehab. She is also doing her leg exercises at home.  She will walk in the house, but does not want to go out by herself.  We talked about walking at the store or mall where there are other people and benches to  use.  Or her other option would be to return to the [facility] again. She is going to think about it. Overall, she is feeling better. Paighton has moved to level 4 on NS.  She could not maintain 80 SPM at level 6.  Staff will encourage her to progress with strength and TM speed/grade. Shaneque is doing well in rehab.  She is on level 5 for the NuStep.  We will continue to monitor her progress. Annaleise has been able to tell her legs are a lot stronger since she started HT.  She plans to join the [place] when she graduates.  She has been walking on the TM. [place] is doing well. Will continue to focus on increasing workload and improving overall MET level. Continue to monitor.   Expected Outcomes Short: Find a safe place to walk where she feels more comfortable  Long: Continue to improve stamina. Short: coninue to work on walking Long: improve MET level Short: Get back to walking on treadmill  Long; Continue to improve stamina. Short:  continue to walk Long: build overall stamina Short: Increase workloads Long: Aim to increase overall MET level and increase strength/ endurance   Row Name 09/20/19 1459             Exercise Goal Re-Evaluation   Exercise Goals Review Increase Physical Activity;Increase Strength and Stamina;Understanding of Exercise Prescription       Comments Kieley graduates this week.  She plans to continue to exercise by going back to the [facility].       Expected Outcomes Continue to exercise indpendently.              Nutrition & Weight - Outcomes:  Pre Biometrics - 06/21/19 1448      Pre Biometrics   Height _0  (1.575 m)    Weight 133 lb 11.2 oz (60.6 kg)    BMI (Calculated) 24.45    Single Leg Stand 3.22 seconds            Nutrition:  Nutrition Therapy & Goals - 08/16/19 1234      Nutrition Therapy   Diet Low Na, heart healthy    Drug/Food Interactions Statins/Certain Fruits    Protein (specify units) 45-50g    Fiber 25 grams    Whole Grain Foods 2 servings     Saturated Fats 12 max. grams    Fruits and Vegetables 4 servings/day    Sodium 1.5 grams      Personal Nutrition Goals   Nutrition Goal ST: try bottle water instead of salt treated water. LT: gain strength back in legs    Comments Bowl of cereal frosted flakes (lactaid milk) with coffee and milk. L: goes out for lunch, likes  apple jack chicken at [employer] with salad with mashed potatoes. Sweet tea - 2 glasses. D:sometimes leftovers or crackers. water and tea. Also will have boost during the day. Pt reports usisng salt to filter hard water and that is what she uses to make coffee and cook, thinks her BP is high - lunch is high in Na, but not unlikely a significant portion paired with the rest of the day to exceed recommendations. Discussed heart healty eating.           Nutrition Discharge:  Nutrition Assessments - 06/21/19 1449      MEDFICTS Scores   Pre Score 24           Education Questionnaire Score:  Knowledge Questionnaire Score - 09/20/19 1439      Knowledge Questionnaire Score   Post Score 22/26           Goals reviewed with patient; copy given to patient.

## 2019-09-22 NOTE — Progress Notes (Signed)
Cardiac Individual Treatment Plan  Patient Details  Name: Jenna Young MRN: [id number] Date of Birth: [birth year] Referring Provider:     Cardiac Rehab from 06/21/2019 in [facility] Cardiac and Pulmonary Rehab  Referring Provider Ida Rogue MD      Initial Encounter Date:    Cardiac Rehab from 06/21/2019 in [facility] Cardiac and Pulmonary Rehab  Date 06/21/19      Visit Diagnosis: NSTEMI (non-ST elevated myocardial infarction) [facility])  Patient's Home Medications on Admission:  Current Outpatient Medications:  .  aspirin (ASPIRIN EC) 81 MG EC tablet, Take 81 mg by mouth daily. Swallow whole. , Disp: , Rfl:  .  atorvastatin (LIPITOR) 40 MG tablet, Take 1 tablet (40 mg total) by mouth daily at 6 PM., Disp: 90 tablet, Rfl: 0 .  carvedilol (COREG) 3.125 MG tablet, Take 1 tablet (3.125 mg total) by mouth 2 (two) times daily., Disp: 180 tablet, Rfl: 3 .  Cholecalciferol (VITAMIN D) 2000 UNITS CAPS, Take 1 capsule by mouth daily.  , Disp: , Rfl:  .  clopidogrel (PLAVIX) 75 MG tablet, Take 1 tablet (75 mg total) by mouth daily with breakfast., Disp: 90 tablet, Rfl: 3 .  cyanocobalamin (,VITAMIN B-12,) 1000 MCG/ML injection, Inject 1,000 mcg into the muscle every 3 (three) months., Disp: , Rfl:  .  isosorbide mononitrate (IMDUR) 30 MG 24 hr tablet, Take 0.5 tablets (15 mg total) by mouth daily., Disp: 45 tablet, Rfl: 3 .  losartan (COZAAR) 25 MG tablet, TAKE 1 TABLET(25 MG) BY MOUTH DAILY, Disp: 90 tablet, Rfl: 0 .  nitroGLYCERIN (NITROSTAT) 0.4 MG SL tablet, PLACE 1 TABLET UNDER TONGUE EVERY 5 MIN AS NEEDED FOR CHEST PAIN IF NO RELIEF IN15 MIN CALL 911 (MAX 3 TABS), Disp: 25 tablet, Rfl: 1 .  omeprazole (PRILOSEC) 20 MG capsule, Take 1 capsule (20 mg total) by mouth daily as needed., Disp: 90 capsule, Rfl: 3 .  terconazole (TERAZOL 7) 0.4 % vaginal cream, Apply to affected areas of vulva daily for itching as needed, Disp: 45 g, Rfl: 0  Past Medical History: Past Medical History:  Diagnosis Date   . Allergic rhinitis   . Anemia   . Anxiety   . GERD (gastroesophageal reflux disease)   . History of radiation therapy 04/29/16- 05/24/16   Left Breast 40.05 Gy in 15 fractions, Left Breast boost 10 Gy in 5 fractions.   . Hyperlipidemia   . Hypertension    treated by Dr Thurnell Garbe recently with syncope episode  . Osteopenia   . Osteoporosis   . Personal history of radiation therapy 2018    Tobacco Use: Social History   Tobacco Use  Smoking Status Former Smoker  . Quit date: 03/11/1968  . Years since quitting: 51.5  Smokeless Tobacco Never Used    Labs: Recent Chemical engineer    Labs for ITP Cardiac and Pulmonary Rehab Latest Ref Rng & Units 02/11/2017 03/31/2017 10/17/2017 04/08/2018 04/14/2019   Cholestrol 0 - 200 mg/dL 173 125 121 140 200   LDLCALC 0 - 99 mg/dL 100(H) 56 51 66 121(H)   LDLDIRECT mg/dL - - - - -   HDL >39.00 mg/dL 53 50.50 53 54.00 55.50   Trlycerides 0 - 149 mg/dL 98 92.0 83 101.0 117.0   Hemoglobin A1c 4.6 - 6.5 % - - - - 6.2   TCO2 0 - 100 mmol/L - - - - -       Exercise Target Goals: Exercise Program Goal: Individual exercise prescription set using results from  initial 6 min walk test and THRR while considering  patient's activity barriers and safety.   Exercise Prescription Goal: Initial exercise prescription builds to 30-45 minutes a day of aerobic activity, 2-3 days per week.  Home exercise guidelines will be given to patient during program as part of exercise prescription that the participant will acknowledge.   Education: Aerobic Exercise & Resistance Training: - Gives group verbal and written instruction on the various components of exercise. Focuses on aerobic and resistive training programs and the benefits of this training and how to safely progress through these programs..   Cardiac Rehab from 05/15/2017 in [facility] Cardiac and Pulmonary Rehab  Date 03/13/17  Educator [facility]  Instruction Review Code 1- Verbalizes Understanding       Education: Exercise & Equipment Safety: - Individual verbal instruction and demonstration of equipment use and safety with use of the equipment.   Cardiac Rehab from 06/21/2019 in [facility] Cardiac and Pulmonary Rehab  Date 06/21/19  Educator [facility]  Instruction Review Code 1- Verbalizes Understanding      Education: Exercise Physiology & General Exercise Guidelines: - Group verbal and written instruction with models to review the exercise physiology of the cardiovascular system and associated critical values. Provides general exercise guidelines with specific guidelines to those with heart or lung disease.    Cardiac Rehab from 05/15/2017 in [facility] Cardiac and Pulmonary Rehab  Date 05/01/17  Educator [facility]  Instruction Review Code 1- Verbalizes Understanding      Education: Flexibility, Balance, Mind/Body Relaxation: Provides group verbal/written instruction on the benefits of flexibility and balance training, including mind/body exercise modes such as yoga, pilates and tai chi.  Demonstration and skill practice provided.   Cardiac Rehab from 05/15/2017 in [facility] Cardiac and Pulmonary Rehab  Date 05/08/17  Educator AS  Instruction Review Code 1- Verbalizes Understanding      Activity Barriers & Risk Stratification:  Activity Barriers & Cardiac Risk Stratification - 06/21/19 1445      Activity Barriers & Cardiac Risk Stratification   Activity Barriers Back Problems;Deconditioning;Muscular Weakness;Balance Concerns    Cardiac Risk Stratification High           6 Minute Walk:  6 Minute Walk    Row Name 06/21/19 1444 09/20/19 1608       6 Minute Walk   Phase Initial Discharge    Distance 730 feet 900 feet    Distance % Change -- 23 %    Distance Feet Change -- 170 ft    Walk Time 6 minutes 6 minutes    # of Rest Breaks -- 0    MPH 1.38 1.7    METS 1.17 1.64    RPE 12 18    VO2 Peak 4.11 5.75    Symptoms No No    Resting HR 56 bpm 73 bpm    Resting BP 136/64 130/80    Resting  Oxygen Saturation  97 % --    Exercise Oxygen Saturation  during 6 min walk 97 % --    Max Ex. HR 87 bpm 101 bpm    Max Ex. BP 156/74 154/56    2 Minute Post BP 144/74 --           Oxygen Initial Assessment:   Oxygen Re-Evaluation:   Oxygen Discharge (Final Oxygen Re-Evaluation):   Initial Exercise Prescription:  Initial Exercise Prescription - 06/21/19 1400      Date of Initial Exercise RX and Referring Provider   Date 06/21/19    Referring Provider  Ida Rogue MD      Treadmill   MPH 1.3    Grade 0    Minutes 15    METs 2      NuStep   Level 1    SPM 80    Minutes 15    METs 2      Biostep-RELP   Level 1    SPM 50    Minutes 15    METs 2      Prescription Details   Frequency (times per week) 3    Duration Progress to 30 minutes of continuous aerobic without signs/symptoms of physical distress      Intensity   THRR 40-80% of Max Heartrate 88-119    Ratings of Perceived Exertion 11-13    Perceived Dyspnea 0-4      Progression   Progression Continue to progress workloads to maintain intensity without signs/symptoms of physical distress.      Resistance Training   Training Prescription Yes    Weight 3 lb    Reps 10-15           Perform Capillary Blood Glucose checks as needed.  Exercise Prescription Changes:  Exercise Prescription Changes    Row Name 06/21/19 1400 07/06/19 1400 07/15/19 1400 07/28/19 0800 08/12/19 1100     Response to Exercise   Blood Pressure (Admit) 136/64 108/56 122/64 124/56 132/60   Blood Pressure (Exercise) 156/74 150/62 138/64 142/74 126/74   Blood Pressure (Exit) 144/74 122/62 120/60 128/64 124/64   Heart Rate (Admit) 86 bpm 73 bpm 66 bpm 68 bpm 85 bpm   Heart Rate (Exercise) 87 bpm 96 bpm 102 bpm 90 bpm 90 bpm   Heart Rate (Exit) 58 bpm 78 bpm 71 bpm 66 bpm 66 bpm   Oxygen Saturation (Admit) 97 % -- -- -- --   Oxygen Saturation (Exercise) 97 % -- -- -- --   Rating of Perceived Exertion (Exercise) _0 Symptoms fatigue at end none none none none   Comments walk test results -- -- -- --   Duration -- Continue with 30 min of aerobic exercise without signs/symptoms of physical distress. Continue with 30 min of aerobic exercise without signs/symptoms of physical distress. Continue with 30 min of aerobic exercise without signs/symptoms of physical distress. Continue with 30 min of aerobic exercise without signs/symptoms of physical distress.   Intensity -- THRR unchanged THRR unchanged THRR unchanged THRR unchanged     Progression   Progression -- Continue to progress workloads to maintain intensity without signs/symptoms of physical distress. Continue to progress workloads to maintain intensity without signs/symptoms of physical distress. Continue to progress workloads to maintain intensity without signs/symptoms of physical distress. Continue to progress workloads to maintain intensity without signs/symptoms of physical distress.   Average METs -- 2.07 2.15 1.91 1.9     Resistance Training   Training Prescription -- Yes Yes Yes Yes   Weight -- 3 lb  3lb  3lb 3 lb   Reps -- 10-15 10-15 10-15 10-15     Interval Training   Interval Training -- No No No No     Treadmill   MPH -- 1.4 -- -- --   Grade -- 0 -- -- --   Minutes -- 15 -- -- --   METs -- 2.07 -- -- --     Recumbant Bike   Level -- -- -- 1 --   Watts -- -- -- 11 --   Minutes -- -- --  15 --   METs -- -- -- 2.57 --     NuStep   Level -- _0 SPM -- -- 80 -- 80   Minutes -- _1 METs -- 2.3 2.3 1.6 1.9     Arm Ergometer   Level -- -- -- 1 --   Minutes -- -- -- 15 --   METs -- -- -- 1.5 --     Biostep-RELP   Level -- _2 --   SPM -- -- 50 -- --   Minutes -- _3 --   METs -- _4 --     Home Exercise Plan   Plans to continue exercise at -- -- -- Home (comment)  walking Home (comment)  walking   Frequency -- -- -- Add 3 additional days to program exercise sessions. Add 3 additional days to  program exercise sessions.   Initial Home Exercises Provided -- -- -- 07/08/19 07/08/19   Row Name 08/26/19 1500 09/08/19 1400 09/20/19 1500         Response to Exercise   Blood Pressure (Admit) 132/62 108/64 120/62     Blood Pressure (Exercise) 134/58 134/60 146/64     Blood Pressure (Exit) 132/58 118/70 124/60     Heart Rate (Admit) 65 bpm 69 bpm 72 bpm     Heart Rate (Exercise) 88 bpm 90 bpm 97 bpm     Heart Rate (Exit) 68 bpm 65 bpm 70 bpm     Rating of Perceived Exertion (Exercise) _5 Symptoms none none none     Duration Continue with 30 min of aerobic exercise without signs/symptoms of physical distress. Continue with 30 min of aerobic exercise without signs/symptoms of physical distress. Continue with 30 min of aerobic exercise without signs/symptoms of physical distress.     Intensity THRR unchanged THRR unchanged THRR unchanged       Progression   Progression Continue to progress workloads to maintain intensity without signs/symptoms of physical distress. Continue to progress workloads to maintain intensity without signs/symptoms of physical distress. Continue to progress workloads to maintain intensity without signs/symptoms of physical distress.     Average METs 2.3 2.32 2.15       Resistance Training   Training Prescription Yes Yes Yes     Weight 3 lb 3 lb 3 lb     Reps 10-15 10-15 10-15       Interval Training   Interval Training No No No       Treadmill   MPH -- 1.7 1.6     Grade -- 0.5 0.5     Minutes -- 15 15     METs -- 2.42 2.32       NuStep   Level _6 Minutes _7 METs 2.3 2.3 2.7       T5 Nustep   Level -- -- 3     Minutes -- -- 15     METs -- -- 1.6       Biostep-RELP   Level -- -- 3     Minutes -- -- 15     METs -- -- 2       Home Exercise Plan   Plans to continue exercise at Home (comment)  walking Home (comment)  walking Home (comment)  walking     Frequency Add 3 additional days to  program exercise sessions. Add 3  additional days to program exercise sessions. Add 3 additional days to program exercise sessions.     Initial Home Exercises Provided 07/08/19 07/08/19 07/08/19            Exercise Comments:   Exercise Goals and Review:  Exercise Goals    Row Name 06/21/19 1447             Exercise Goals   Increase Physical Activity Yes       Intervention Provide advice, education, support and counseling about physical activity/exercise needs.;Develop an individualized exercise prescription for aerobic and resistive training based on initial evaluation findings, risk stratification, comorbidities and participant's personal goals.       Expected Outcomes Short Term: Attend rehab on a regular basis to increase amount of physical activity.;Long Term: Exercising regularly at least 3-5 days a week.;Long Term: Add in home exercise to make exercise part of routine and to increase amount of physical activity.       Increase Strength and Stamina Yes       Intervention Provide advice, education, support and counseling about physical activity/exercise needs.;Develop an individualized exercise prescription for aerobic and resistive training based on initial evaluation findings, risk stratification, comorbidities and participant's personal goals.       Expected Outcomes Short Term: Increase workloads from initial exercise prescription for resistance, speed, and METs.;Short Term: Perform resistance training exercises routinely during rehab and add in resistance training at home;Long Term: Improve cardiorespiratory fitness, muscular endurance and strength as measured by increased METs and functional capacity (6MWT)       Able to understand and use rate of perceived exertion (RPE) scale Yes       Intervention Provide education and explanation on how to use RPE scale       Expected Outcomes Short Term: Able to use RPE daily in rehab to express subjective intensity level;Long Term:  Able to use RPE to guide intensity level  when exercising independently       Able to understand and use Dyspnea scale Yes       Intervention Provide education and explanation on how to use Dyspnea scale       Expected Outcomes Short Term: Able to use Dyspnea scale daily in rehab to express subjective sense of shortness of breath during exertion;Long Term: Able to use Dyspnea scale to guide intensity level when exercising independently       Knowledge and understanding of Target Heart Rate Range (THRR) Yes       Intervention Provide education and explanation of THRR including how the numbers were predicted and where they are located for reference       Expected Outcomes Short Term: Able to state/look up THRR;Long Term: Able to use THRR to govern intensity when exercising independently;Short Term: Able to use daily as guideline for intensity in rehab       Able to check pulse independently Yes       Intervention Provide education and demonstration on how to check pulse in carotid and radial arteries.;Review the importance of being able to check your own pulse for safety during independent exercise       Expected Outcomes Short Term: Able to explain why pulse checking is important during independent exercise;Long Term: Able to check pulse independently and accurately       Understanding of Exercise Prescription Yes       Intervention Provide education, explanation, and written materials on patient's individual exercise prescription  Expected Outcomes Short Term: Able to explain program exercise prescription;Long Term: Able to explain home exercise prescription to exercise independently              Exercise Goals Re-Evaluation :  Exercise Goals Re-Evaluation    Row Name 06/23/19 1341 07/06/19 1411 07/08/19 1318 07/15/19 1415 07/28/19 0847     Exercise Goal Re-Evaluation   Exercise Goals Review Increase Physical Activity;Able to understand and use rate of perceived exertion (RPE) scale;Knowledge and understanding of Target Heart  Rate Range (THRR);Understanding of Exercise Prescription;Increase Strength and Stamina;Able to check pulse independently Increase Physical Activity;Increase Strength and Stamina;Understanding of Exercise Prescription Increase Physical Activity;Increase Strength and Stamina;Understanding of Exercise Prescription Increase Physical Activity;Increase Strength and Stamina;Able to understand and use rate of perceived exertion (RPE) scale;Able to understand and use Dyspnea scale;Knowledge and understanding of Target Heart Rate Range (THRR);Able to check pulse independently;Understanding of Exercise Prescription Increase Physical Activity;Increase Strength and Stamina;Understanding of Exercise Prescription   Comments Reviewed RPE scale, THR and program prescription with pt today.  Pt voiced understanding and was given a copy of goals to take home. Sophiarose is off to a good start in rehab.  She is up to 2.3 METs on the NuStep.  We will start to increase her workloads and continue to monitor her progress. Rakiya is glad to be back in rehab.  She is feeling pretty good and getting her stamina back. Reviewed home exercise with pt today.  Pt plans to walking and staff videos for exercise.  Reviewed THR, pulse, RPE, sign and symptoms, NTG use, and when to call 911 or MD.  Also discussed weather considerations and indoor options.  Pt voiced understanding. Voula has done well in her first weeks of rehab.  She has moved up her levels slightly on all machines.  Staff will monitor progress. Orlene continues to do well in rehab.  She is now on level 6 on the NuStep.  We have talked to her about maintaining her pace at 33 spm, but she forgets and we will continue to remind her.  We will continue to monitor her progress.   Expected Outcomes Short: Use RPE daily to regulate intensity. Long: Follow program prescription in THR. Short: Start to increase workloads  Long: Continue to improve stamina. Short: Start to add in exercise on off days  from class  Long: Continue to improve stamina. Short : exercise consistently Long: improve MET level Short: Increase spm on steppers  Long; Continue to improve stamina.   [place] Name 08/06/19 1013 08/12/19 1145 08/26/19 1543 08/30/19 1318 09/08/19 1451     Exercise Goal Re-Evaluation   Exercise Goals Review Increase Physical Activity;Increase Strength and Stamina;Understanding of Exercise Prescription Increase Physical Activity;Increase Strength and Stamina;Understanding of Exercise Prescription Increase Physical Activity;Increase Strength and Stamina;Understanding of Exercise Prescription Increase Physical Activity;Increase Strength and Stamina;Understanding of Exercise Prescription Increase Physical Activity;Increase Strength and Stamina;Understanding of Exercise Prescription   Comments Hazelynn is doing well in rehab. She is also doing her leg exercises at home.  She will walk in the house, but does not want to go out by herself.  We talked about walking at the store or mall where there are other people and benches to use.  Or her other option would be to return to the [facility] again. She is going to think about it. Overall, she is feeling better. Clifton has moved to level 4 on NS.  She could not maintain 80 SPM at level 6.  Staff will encourage her to  progress with strength and TM speed/grade. Zabella is doing well in rehab.  She is on level 5 for the NuStep.  We will continue to monitor her progress. Cierah has been able to tell her legs are a lot stronger since she started HT.  She plans to join the [employer] when she graduates.  She has been walking on the TM. Lynnox is doing well. Will continue to focus on increasing workload and improving overall MET level. Continue to monitor.   Expected Outcomes Short: Find a safe place to walk where she feels more comfortable  Long: Continue to improve stamina. Short: coninue to work on walking Long: improve MET level Short: Get back to walking on treadmill  Long; Continue to  improve stamina. Short:  continue to walk Long: build overall stamina Short: Increase workloads Long: Aim to increase overall MET level and increase strength/ endurance   Row Name 09/20/19 1459             Exercise Goal Re-Evaluation   Exercise Goals Review Increase Physical Activity;Increase Strength and Stamina;Understanding of Exercise Prescription       Comments Jozlin graduates this week.  She plans to continue to exercise by going back to the [facility].       Expected Outcomes Continue to exercise indpendently.              Discharge Exercise Prescription (Final Exercise Prescription Changes):  Exercise Prescription Changes - 09/20/19 1500      Response to Exercise   Blood Pressure (Admit) 120/62    Blood Pressure (Exercise) 146/64    Blood Pressure (Exit) 124/60    Heart Rate (Admit) 72 bpm    Heart Rate (Exercise) 97 bpm    Heart Rate (Exit) 70 bpm    Rating of Perceived Exertion (Exercise) 13    Symptoms none    Duration Continue with 30 min of aerobic exercise without signs/symptoms of physical distress.    Intensity THRR unchanged      Progression   Progression Continue to progress workloads to maintain intensity without signs/symptoms of physical distress.    Average METs 2.15      Resistance Training   Training Prescription Yes    Weight 3 lb    Reps 10-15      Interval Training   Interval Training No      Treadmill   MPH 1.6    Grade 0.5    Minutes 15    METs 2.32      NuStep   Level 4    Minutes 15    METs 2.7      T5 Nustep   Level 3    Minutes 15    METs 1.6      Biostep-RELP   Level 3    Minutes 15    METs 2      Home Exercise Plan   Plans to continue exercise at Home (comment)   walking   Frequency Add 3 additional days to program exercise sessions.    Initial Home Exercises Provided 07/08/19           Nutrition:  Target Goals: Understanding of nutrition guidelines, daily intake of sodium <1540m, cholesterol <2079m calories  30% from fat and 7% or less from saturated fats, daily to have 5 or more servings of fruits and vegetables.  Education: Controlling Sodium/Reading Food Labels -Group verbal and written material supporting the discussion of sodium use in heart healthy nutrition. Review and explanation with models, verbal and  written materials for utilization of the food label.   Cardiac Rehab from 05/15/2017 in [facility] Cardiac and Pulmonary Rehab  Date 04/15/17  Educator CR  Instruction Review Code 1- [employer] Understanding      Education: General Nutrition Guidelines/Fats and Fiber: -Group instruction provided by verbal, written material, models and posters to present the general guidelines for heart healthy nutrition. Gives an explanation and review of dietary fats and fiber.   Cardiac Rehab from 05/15/2017 in [facility] Cardiac and Pulmonary Rehab  Date 04/08/17  Educator CR  Instruction Review Code 1- Verbalizes Understanding      Biometrics:  Pre Biometrics - 06/21/19 1448      Pre Biometrics   Height 5' 2" (1.575 m)    Weight 133 lb 11.2 oz (60.6 kg)    BMI (Calculated) 24.45    Single Leg Stand 3.22 seconds            Nutrition Therapy Plan and Nutrition Goals:  Nutrition Therapy & Goals - 08/16/19 1234      Nutrition Therapy   Diet Low Na, heart healthy    Drug/Food Interactions Statins/Certain Fruits    Protein (specify units) 45-50g    Fiber 25 grams    Whole Grain Foods 2 servings    Saturated Fats 12 max. grams    Fruits and Vegetables 4 servings/day    Sodium 1.5 grams      Personal Nutrition Goals   Nutrition Goal ST: try bottle water instead of salt treated water. LT: gain strength back in legs    Comments Bowl of cereal frosted flakes (lactaid milk) with coffee and milk. L: goes out for lunch, likes apple jack chicken at [employer] with salad with mashed potatoes. Sweet tea - 2 glasses. D:sometimes leftovers or crackers. water and tea. Also will have boost during the day. Pt  reports usisng salt to filter hard water and that is what she uses to make coffee and cook, thinks her BP is high - lunch is high in Na, but not unlikely a significant portion paired with the rest of the day to exceed recommendations. Discussed heart healty eating.           Nutrition Assessments:  Nutrition Assessments - 06/21/19 1449      MEDFICTS Scores   Pre Score 24           MEDIFICTS Score Key:          ?70 Need to make dietary changes          40-70 Heart Healthy Diet         ? 40 Therapeutic Level Cholesterol Diet  Nutrition Goals Re-Evaluation:  Nutrition Goals Re-Evaluation    [place] Name 08/30/19 1323             Goals   Nutrition Goal Shital is trying to drink more water.  She feels she has gained strength in her legs.       Expected Outcome Short:  continue to drink water for hydration Long: maintain weight              Nutrition Goals Discharge (Final Nutrition Goals Re-Evaluation):  Nutrition Goals Re-Evaluation - 08/30/19 1323      Goals   Nutrition Goal Lailynn is trying to drink more water.  She feels she has gained strength in her legs.    Expected Outcome Short:  continue to drink water for hydration Long: maintain weight           Psychosocial:  Target Goals: Acknowledge presence or absence of significant depression and/or stress, maximize coping skills, provide positive support system. Participant is able to verbalize types and ability to use techniques and skills needed for reducing stress and depression.   Education: Depression - Provides group verbal and written instruction on the correlation between heart/lung disease and depressed mood, treatment options, and the stigmas associated with seeking treatment.   Cardiac Rehab from 05/15/2017 in [facility] Cardiac and Pulmonary Rehab  Date 04/29/17  Educator [facility]  Instruction Review Code 1- [employer] Understanding      Education: Sleep Hygiene -Provides group verbal and written instruction about how  sleep can affect your health.  Define sleep hygiene, discuss sleep cycles and impact of sleep habits. Review good sleep hygiene tips.    Cardiac Rehab from 05/15/2017 in [facility] Cardiac and Pulmonary Rehab  Date 04/10/17  Educator [facility]  Instruction Review Code 1- Verbalizes Understanding       Education: Stress and Anxiety: - Provides group verbal and written instruction about the health risks of elevated stress and causes of high stress.  Discuss the correlation between heart/lung disease and anxiety and treatment options. Review healthy ways to manage with stress and anxiety.   Cardiac Rehab from 05/15/2017 in [facility] Cardiac and Pulmonary Rehab  Date 05/13/17  Educator [facility]  Instruction Review Code 1- Verbalizes Understanding       Initial Review & Psychosocial Screening:  Initial Psych Review & Screening - 06/17/19 1109      Initial Review   Current issues with None Identified      Family Dynamics   Good Support System? Yes   children     Barriers   Psychosocial barriers to participate in program There are no identifiable barriers or psychosocial needs.;The patient should benefit from training in stress management and relaxation.      Screening Interventions   Expected Outcomes Short Term goal: Utilizing psychosocial counselor, staff and physician to assist with identification of specific Stressors or current issues interfering with healing process. Setting desired goal for each stressor or current issue identified.;Long Term Goal: Stressors or current issues are controlled or eliminated.;Short Term goal: Identification and review with participant of any Quality of Life or Depression concerns found by scoring the questionnaire.;Long Term goal: The participant improves quality of Life and PHQ9 Scores as seen by post scores and/or verbalization of changes           Quality of Life Scores:   Quality of Life - 09/20/19 1441      Quality of Life   Select Quality of Life      Quality of  Life Scores   Health/Function Pre 27.43 %    Health/Function Post 24.39 %    Health/Function % Change -11.08 %    Socioeconomic Pre 30 %    Socioeconomic Post 30 %    Socioeconomic % Change  0 %    Psych/Spiritual Pre 30 %    Psych/Spiritual Post 24.86 %    Psych/Spiritual % Change -17.13 %    Family Pre 30 %    Family Post 30 %    Family % Change 0 %    GLOBAL Pre 28.8 %    GLOBAL Post 26.31 %    GLOBAL % Change -8.65 %          Scores of 19 and below usually indicate a poorer quality of life in these areas.  A difference of  2-3 points is a clinically meaningful difference.  A  difference of 2-3 points in the total score of the Quality of Life Index has been associated with significant improvement in overall quality of life, self-image, physical symptoms, and general health in studies assessing change in quality of life.  PHQ-9: Recent Review Flowsheet Data    Depression screen [facility] 2/9 09/20/2019 06/21/2019 04/14/2019 04/08/2018 05/13/2017   Decreased Interest 0 0 0 0 0   Down, Depressed, Hopeless 0 0 0 0 0   PHQ - 2 Score 0 0 0 0 0   Altered sleeping 0 0 0 0 0   Tired, decreased energy 1 1 0 0 0   Change in appetite 0 0 0 0 0   Feeling bad or failure about yourself  0 0 0 0 0   Trouble concentrating 0 0 0 0 0   Moving slowly or fidgety/restless 0 0 0 0 0   Suicidal thoughts 0 0 0 0 0   PHQ-9 Score 1 1 0 0 0   Difficult doing work/chores Not difficult at all Somewhat difficult Not difficult at all Not difficult at all Not difficult at all     Interpretation of Total Score  Total Score Depression Severity:  1-4 = Minimal depression, 5-9 = Mild depression, 10-14 = Moderate depression, 15-19 = Moderately severe depression, 20-27 = Severe depression   Psychosocial Evaluation and Intervention:  Psychosocial Evaluation - 06/17/19 1109      Psychosocial Evaluation & Interventions   Interventions Encouraged to exercise with the program and follow exercise prescription    Comments  Jonny reports doing well. She has a great support system of children and grandchildren. She states doing fine during the pandemic, staying home. She sometimes still struggles with appetite, but she is working on it. She wants to come to the program to get  stronger and feel like she has increased stamina    Expected Outcomes Short: attend Cardiac Rehab for exercise and education. Long: maintain positive self care habits.    Continue Psychosocial Services  Follow up required by staff           Psychosocial Re-Evaluation:  Psychosocial Re-Evaluation    [place] Name 07/08/19 1318 08/06/19 1015 08/30/19 1320         Psychosocial Re-Evaluation   Current issues with None Identified Current Stress Concerns Current Stress Concerns     Comments Afiya is doing well mentally.  She continues to sleep well.  Her grandson has moved out since she was here last.  Overall she stays positive. Terasa is doing well mentally still. She is lonely so we talked about getting out to walk more and see other people.  She has started back to church now in person, so that is helping. She continues to sleep well. Jhordyn still enjoys seeing people at church.  She lives alone.  Staff recommended the [facility] for her to continue exercise for the social aspect.     Expected Outcomes Short: Continue to exercise for more stamina  Long: Continue to stay positive. -- Short: continue to attend HT Long: maintain positive outlook     Interventions Encouraged to attend Cardiac Rehabilitation for the exercise -- --     Continue Psychosocial Services  Follow up required by staff -- --            Psychosocial Discharge (Final Psychosocial Re-Evaluation):  Psychosocial Re-Evaluation - 08/30/19 1320      Psychosocial Re-Evaluation   Current issues with Current Stress Concerns    Comments Airyn still enjoys seeing  people at church.  She lives alone.  Staff recommended the [facility] for her to continue exercise for the social aspect.     Expected Outcomes Short: continue to attend HT Long: maintain positive outlook           Vocational Rehabilitation: Provide vocational rehab assistance to qualifying candidates.   Vocational Rehab Evaluation & Intervention:  Vocational Rehab - 06/17/19 1105      Initial Vocational Rehab Evaluation & Intervention   Assessment shows need for Vocational Rehabilitation No           Education: Education Goals: Education classes will be provided on a variety of topics geared toward better understanding of heart health and risk factor modification. Participant will state understanding/return demonstration of topics presented as noted by education test scores.  Learning Barriers/Preferences:  Learning Barriers/Preferences - 06/17/19 1105      Learning Barriers/Preferences   Learning Barriers Hearing    Learning Preferences None           General Cardiac Education Topics:  AED/CPR: - Group verbal and written instruction with the use of models to demonstrate the basic use of the AED with the basic ABC's of resuscitation.   Cardiac Rehab from 05/15/2017 in [facility] Cardiac and Pulmonary Rehab  Date 04/22/17  Educator SB  Instruction Review Code 1- Verbalizes Understanding      Anatomy & Physiology of the Heart: - Group verbal and written instruction and models provide basic cardiac anatomy and physiology, with the coronary electrical and arterial systems. Review of Valvular disease and Heart Failure   Cardiac Rehab from 05/15/2017 in [facility] Cardiac and Pulmonary Rehab  Date 04/03/17  Educator CE  Instruction Review Code 1- Verbalizes Understanding      Cardiac Procedures: - Group verbal and written instruction to review commonly prescribed medications for heart disease. Reviews the medication, class of the drug, and side effects. Includes the steps to properly store meds and maintain the prescription regimen. (beta blockers and nitrates)   Cardiac Rehab from 05/15/2017 in [facility] Cardiac  and Pulmonary Rehab  Date 04/17/17  Educator CE  Instruction Review Code 1- Verbalizes Understanding      Cardiac Medications I: - Group verbal and written instruction to review commonly prescribed medications for heart disease. Reviews the medication, class of the drug, and side effects. Includes the steps to properly store meds and maintain the prescription regimen.   Cardiac Medications II: -Group verbal and written instruction to review commonly prescribed medications for heart disease. Reviews the medication, class of the drug, and side effects. (all other drug classes)   Cardiac Rehab from 05/15/2017 in [facility] Cardiac and Pulmonary Rehab  Date 05/15/17  [facility] Factors]  Educator [facility]  Instruction Review Code 1- Verbalizes Understanding       Go Sex-Intimacy & Heart Disease, Get SMART - Goal Setting: - Group verbal and written instruction through game format to discuss heart disease and the return to sexual intimacy. Provides group verbal and written material to discuss and apply goal setting through the application of the S.M.A.R.T. Method.   Cardiac Rehab from 05/15/2017 in [facility] Cardiac and Pulmonary Rehab  Date 04/17/17  Educator CE  Instruction Review Code 1- Verbalizes Understanding      Other Matters of the Heart: - Provides group verbal, written materials and models to describe Stable Angina and Peripheral Artery. Includes description of the disease process and treatment options available to the cardiac patient.   Cardiac Rehab from 05/15/2017 in [facility] Cardiac and Pulmonary Rehab  Date 04/03/17  Educator CE  Instruction Review Code 1- Verbalizes Understanding      Infection Prevention: - Provides verbal and written material to individual with discussion of infection control including proper hand washing and proper equipment cleaning during exercise session.   Cardiac Rehab from 06/21/2019 in [facility] Cardiac and Pulmonary Rehab  Date 06/21/19  Educator [facility]  Instruction Review  Code 1- Verbalizes Understanding      Falls Prevention: - Provides verbal and written material to individual with discussion of falls prevention and safety.   Cardiac Rehab from 06/21/2019 in [facility] Cardiac and Pulmonary Rehab  Date 06/21/19  Educator [facility]  Instruction Review Code 1- Verbalizes Understanding      Other: -Provides group and verbal instruction on various topics (see comments)   Knowledge Questionnaire Score:  Knowledge Questionnaire Score - 09/20/19 1439      Knowledge Questionnaire Score   Post Score 22/26           Core Components/Risk Factors/Patient Goals at Admission:  Personal Goals and Risk Factors at Admission - 06/21/19 1449      Core Components/Risk Factors/Patient Goals on Admission    Weight Management Yes;Weight Maintenance    Intervention Weight Management: Develop a combined nutrition and exercise program designed to reach desired caloric intake, while maintaining appropriate intake of nutrient and fiber, sodium and fats, and appropriate energy expenditure required for the weight goal.;Weight Management: Provide education and appropriate resources to help participant work on and attain dietary goals.    Admit Weight 133 lb 11.2 oz (60.6 kg)    Goal Weight: Short Term 133 lb (60.3 kg)    Goal Weight: Long Term 133 lb (60.3 kg)    Expected Outcomes Short Term: Continue to assess and modify interventions until short term weight is achieved;Long Term: Adherence to nutrition and physical activity/exercise program aimed toward attainment of established weight goal;Weight Maintenance: Understanding of the daily nutrition guidelines, which includes 25-35% calories from fat, 7% or less cal from saturated fats, less than 288m cholesterol, less than 1.5gm of sodium, & 5 or more servings of fruits and vegetables daily    Hypertension Yes    Intervention Provide education on lifestyle modifcations including regular physical activity/exercise, weight management,  moderate sodium restriction and increased consumption of fresh fruit, vegetables, and low fat dairy, alcohol moderation, and smoking cessation.;Monitor prescription use compliance.    Expected Outcomes Short Term: Continued assessment and intervention until BP is < 140/929mHG in hypertensive participants. < 130/8073mG in hypertensive participants with diabetes, heart failure or chronic kidney disease.;Long Term: Maintenance of blood pressure at goal levels.    Lipids Yes    Intervention Provide education and support for participant on nutrition & aerobic/resistive exercise along with prescribed medications to achieve LDL <22m45mDL >40mg91m Expected Outcomes Short Term: Participant states understanding of desired cholesterol values and is compliant with medications prescribed. Participant is following exercise prescription and nutrition guidelines.;Long Term: Cholesterol controlled with medications as prescribed, with individualized exercise RX and with personalized nutrition plan. Value goals: LDL < 22mg,78m > 40 mg.           Education:Diabetes - Individual verbal and written instruction to review signs/symptoms of diabetes, desired ranges of glucose level fasting, after meals and with exercise. Acknowledge that pre and post exercise glucose checks will be done for 3 sessions at entry of program.   Education: Know Your Numbers and Risk Factors: -Group verbal and written instruction about important numbers in your  health.  Discussion of what are risk factors and how they play a role in the disease process.  Review of Cholesterol, Blood Pressure, Diabetes, and BMI and the role they play in your overall health.   Cardiac Rehab from 05/15/2017 in [facility] Cardiac and Pulmonary Rehab  Date 05/15/17  [facility] Factors]  Educator [facility]  Instruction Review Code 1- Verbalizes Understanding      Core Components/Risk Factors/Patient Goals Review:   Goals and Risk Factor Review    Row Name 07/08/19 1319  08/06/19 1034 08/30/19 1316         Core Components/Risk Factors/Patient Goals Review   Personal Goals Review Weight Management/Obesity;Hypertension;Lipids Weight Management/Obesity;Hypertension;Lipids Weight Management/Obesity;Hypertension;Lipids     Review Caliana is off to a good start in rehab. Her weight is staying steady and doing well.  Her pressures have been doing okay since her medications changed.  She continues to check them at home. Chessie is doing well.  Her weight is steady and she feels good where she is.  Her pressures have continued to do wel and she does monitor them on her own. Wilder states she is taking all meds as directed.  She does check BP at home when she doesnt come to HT.  She can tell she has more strength, especially in her legs.     Expected Outcomes Short: Continue to monitor pressures  Long: Continue to monitor risk factors. Short:Continue to maintain weight  Long: Continue to monitor risk factors. Short: continue to exercise and maintain weight Long: manage risk factors            Core Components/Risk Factors/Patient Goals at Discharge (Final Review):   Goals and Risk Factor Review - 08/30/19 1316      Core Components/Risk Factors/Patient Goals Review   Personal Goals Review Weight Management/Obesity;Hypertension;Lipids    Review Onika states she is taking all meds as directed.  She does check BP at home when she doesnt come to HT.  She can tell she has more strength, especially in her legs.    Expected Outcomes Short: continue to exercise and maintain weight Long: manage risk factors           ITP Comments:  ITP Comments    Row Name 06/17/19 1108 06/21/19 1440 06/23/19 1340 06/30/19 0538 07/28/19 0553   ITP Comments Initial telephone encounter completed. Diagnosis can be found in [facility] 3/12. EP orientation scheduled for 4/12 at 11am Completed 6MWT and gym orientation.  Initial ITP created and sent for review to Dr. Emily Filbert, Medical Director. First full  day of exercise!  Patient was oriented to gym and equipment including functions, settings, policies, and procedures.  Patient's individual exercise prescription and treatment plan were reviewed.  All starting workloads were established based on the results of the 6 minute walk test done at initial orientation visit.  The plan for exercise progression was also introduced and progression will be customized based on patient's performance and goals. 30 Day review completed. Medical Director review done, changes made as directed,and approval shown by signature of Market researcher.  New to program 30 Day review completed. ITP review done, changes made as directed,and approval shown by signature of  Scientist, research (life sciences).   [place] Name 08/25/19 0544 09/22/19 0636         ITP Comments 30 Day review completed. Medical Director ITP review done, changes made as directed, and signed approval by Medical Director. 30 Day review completed. Medical Director ITP review done, changes made as directed, and  signed approval by Medical Director.             Comments:

## 2019-09-22 NOTE — Progress Notes (Signed)
Discharge Progress Report  Patient Details  Name: Jenna Young MRN: [id number] Date of Birth: [birth year] Referring Provider:     Cardiac Rehab from 06/21/2019 in [facility]  Referring Provider Ida Rogue MD       Number of Visits: 36  Reason for Discharge:  Patient reached a stable level of exercise. Patient independent in their exercise. Patient has met program and personal goals.  Smoking History:  Social History   Tobacco Use  Smoking Status Former Smoker  . Quit date: 03/11/1968  . Years since quitting: 51.5  Smokeless Tobacco Never Used    Diagnosis:  NSTEMI (non-ST elevated myocardial infarction) ([place])  ADL [facility]SD:   Initial Exercise Prescription:  Initial Exercise Prescription - 06/21/19 1400      Date of Initial Exercise RX and Referring Provider   Date 06/21/19    Referring Provider Ida Rogue MD      Treadmill   MPH 1.3    Grade 0    Minutes 15    METs 2      NuStep   Level 1    SPM 80    Minutes 15    METs 2      Biostep-RELP   Level 1    SPM 50    Minutes 15    METs 2      Prescription Details   Frequency (times per week) 3    Duration Progress to 30 minutes of continuous aerobic without signs/symptoms of physical distress      Intensity   THRR 40-80% of Max Heartrate 88-119    Ratings of Perceived Exertion 11-13    Perceived Dyspnea 0-4      Progression   Progression Continue to progress workloads to maintain intensity without signs/symptoms of physical distress.      Resistance Training   Training Prescription Yes    Weight 3 lb    Reps 10-15           Discharge Exercise Prescription (Final Exercise Prescription Changes):  Exercise Prescription Changes - 09/20/19 1500      Response to Exercise   Blood Pressure (Admit) 120/62    Blood Pressure (Exercise) 146/64    Blood Pressure (Exit) 124/60    Heart Rate (Admit) 72 bpm    Heart Rate (Exercise) 97 bpm    Heart Rate (Exit) 70 bpm      Rating of Perceived Exertion (Exercise) 13    Symptoms none    Duration Continue with 30 min of aerobic exercise without signs/symptoms of physical distress.    Intensity THRR unchanged      Progression   Progression Continue to progress workloads to maintain intensity without signs/symptoms of physical distress.    Average METs 2.15      Resistance Training   Training Prescription Yes    Weight 3 lb    Reps 10-15      Interval Training   Interval Training No      Treadmill   MPH 1.6    Grade 0.5    Minutes 15    METs 2.32      NuStep   Level 4    Minutes 15    METs 2.7      T5 Nustep   Level 3    Minutes 15    METs 1.6      Biostep-RELP   Level 3    Minutes 15    METs 2      Home  Exercise Plan   Plans to continue exercise at Home (comment)   walking   Frequency Add 3 additional days to program exercise sessions.    Initial Home Exercises Provided 07/08/19           Functional Capacity:  6 Minute Walk    Row Name 06/21/19 1444 09/20/19 1608       6 Minute Walk   Phase Initial Discharge    Distance 730 feet 900 feet    Distance % Change -- 23 %    Distance Feet Change -- 170 ft    Walk Time 6 minutes 6 minutes    # of Rest Breaks -- 0    MPH 1.38 1.7    METS 1.17 1.64    RPE 12 18    VO2 Peak 4.11 5.75    Symptoms No No    Resting HR 56 bpm 73 bpm    Resting BP 136/64 130/80    Resting Oxygen Saturation  97 % --    Exercise Oxygen Saturation  during 6 min walk 97 % --    Max Ex. HR 87 bpm 101 bpm    Max Ex. BP 156/74 154/56    2 Minute Post BP 144/74 --           Psychological, QOL, Others - Outcomes: PHQ 2/9: Depression screen [facility] 2/9 09/20/2019 06/21/2019 04/14/2019 04/08/2018 05/13/2017  Decreased Interest 0 0 0 0 0  Down, Depressed, Hopeless 0 0 0 0 0  PHQ - 2 Score 0 0 0 0 0  Altered sleeping 0 0 0 0 0  Tired, decreased energy 1 1 0 0 0  Change in appetite 0 0 0 0 0  Feeling bad or failure about yourself  0 0 0 0 0  Trouble  concentrating 0 0 0 0 0  Moving slowly or fidgety/restless 0 0 0 0 0  Suicidal thoughts 0 0 0 0 0  PHQ-9 Score 1 1 0 0 0  Difficult doing work/chores Not difficult at all Somewhat difficult Not difficult at all Not difficult at all Not difficult at all  Some recent data might be hidden    Quality of Life:  Quality of Life - 09/20/19 1441      Quality of Life   Select Quality of Life      Quality of Life Scores   Health/Function Pre 27.43 %    Health/Function Post 24.39 %    Health/Function % Change -11.08 %    Socioeconomic Pre 30 %    Socioeconomic Post 30 %    Socioeconomic % Change  0 %    Psych/Spiritual Pre 30 %    Psych/Spiritual Post 24.86 %    Psych/Spiritual % Change -17.13 %    Family Pre 30 %    Family Post 30 %    Family % Change 0 %    GLOBAL Pre 28.8 %    GLOBAL Post 26.31 %    GLOBAL % Change -8.65 %            Nutrition & Weight - Outcomes:  Pre Biometrics - 06/21/19 1448      Pre Biometrics   Height 5' 2" (1.575 m)    Weight 133 lb 11.2 oz (60.6 kg)    BMI (Calculated) 24.45    Single Leg Stand 3.22 seconds            Nutrition:  Nutrition Therapy & Goals - 08/16/19 1234  Nutrition Therapy   Diet Low Na, heart healthy    Drug/Food Interactions Statins/Certain Fruits    Protein (specify units) 45-50g    Fiber 25 grams    Whole Grain Foods 2 servings    Saturated Fats 12 max. grams    Fruits and Vegetables 4 servings/day    Sodium 1.5 grams      Personal Nutrition Goals   Nutrition Goal ST: try bottle water instead of salt treated water. LT: gain strength back in legs    Comments Bowl of cereal frosted flakes (lactaid milk) with coffee and milk. L: goes out for lunch, likes apple jack chicken at [employer] with salad with mashed potatoes. Sweet tea - 2 glasses. D:sometimes leftovers or crackers. water and tea. Also will have boost during the day. Pt reports usisng salt to filter hard water and that is what she uses to make coffee  and cook, thinks her BP is high - lunch is high in Na, but not unlikely a significant portion paired with the rest of the day to exceed recommendations. Discussed heart healty eating.           Nutrition Discharge:  Nutrition Assessments - 06/21/19 1449      MEDFICTS Scores   Pre Score 24           Education Questionnaire Score:  Knowledge Questionnaire Score - 09/20/19 1439      Knowledge Questionnaire Score   Post Score 22/26           Goals reviewed with patient; copy given to patient.

## 2019-09-22 NOTE — Progress Notes (Signed)
Daily Session Note  Patient Details  Name: Jenna Young MRN: [id number] Date of Birth: 1931-09-28 Referring Provider:     Cardiac Rehab from 06/21/2019 in [facility] Card[facility] Rehab  Referring Provider Ida Rogue MD      Encounter Date: 09/22/2019  Check In:  Session Check In - 09/22/19 1347      Check-In   Supervising physician immediately available to respond to emergencies See telemetry face sheet for immediately available ER MD    Location [facility]-Cardiac & Pulmonary Rehab    Staff Present Renita Papa, RN BSN;Joseph Lou Miner, [place] Exercise Physiologist    Virtual Visit No    Medication changes reported     No    Fall or balance concerns reported    No    Warm-up and Cool-down Performed on first and last piece of equipment    Resistance Training Performed Yes    VAD Patient? No    PAD/SET Patient? No      Pain Assessment   Currently in Pain? No/denies              Social History   Tobacco Use  Smoking Status Former Smoker  . Quit date: 03/11/1968  . Years since quitting: 51.5  Smokeless Tobacco Never Used    Goals Met:  Independence with exercise equipment Exercise tolerated well No report of cardiac concerns or symptoms Strength training completed today  Goals Unmet:  Not Applicable  Comments:  Makaelah graduated today from  rehab with 36 sessions completed.  Details of the patient's exercise prescription and what She needs to do in order to continue the prescription and progress were discussed with patient.  Patient was given a copy of prescription and goals.  Patient verbalized understanding.  Kasey plans to continue to exercise by walking at home.    Dr. Emily Filbert is Medical Director for [place] and [facility].

## 2019-09-22 NOTE — Progress Notes (Signed)
Cardiac Individual Treatment Plan  Patient Details  Name: Jenna Young MRN: [id number] Date of Birth: [birth year] Referring Provider:     Cardiac Rehab from 06/21/2019 in [facility] Cardiac and Pulmonary Rehab  Referring Provider Ida Rogue MD      Initial Encounter Date:    Cardiac Rehab from 06/21/2019 in [facility] Cardiac and Pulmonary Rehab  Date 06/21/19      Visit Diagnosis: NSTEMI (non-ST elevated myocardial infarction) [facility])  Patient's Home Medications on Admission:  Current Outpatient Medications:  .  aspirin (ASPIRIN EC) 81 MG EC tablet, Take 81 mg by mouth daily. Swallow whole. , Disp: , Rfl:  .  atorvastatin (LIPITOR) 40 MG tablet, Take 1 tablet (40 mg total) by mouth daily at 6 PM., Disp: 90 tablet, Rfl: 0 .  carvedilol (COREG) 3.125 MG tablet, Take 1 tablet (3.125 mg total) by mouth 2 (two) times daily., Disp: 180 tablet, Rfl: 3 .  Cholecalciferol (VITAMIN D) 2000 UNITS CAPS, Take 1 capsule by mouth daily.  , Disp: , Rfl:  .  clopidogrel (PLAVIX) 75 MG tablet, Take 1 tablet (75 mg total) by mouth daily with breakfast., Disp: 90 tablet, Rfl: 3 .  cyanocobalamin (,VITAMIN B-12,) 1000 MCG/ML injection, Inject 1,000 mcg into the muscle every 3 (three) months., Disp: , Rfl:  .  isosorbide mononitrate (IMDUR) 30 MG 24 hr tablet, Take 0.5 tablets (15 mg total) by mouth daily., Disp: 45 tablet, Rfl: 3 .  losartan (COZAAR) 25 MG tablet, TAKE 1 TABLET(25 MG) BY MOUTH DAILY, Disp: 90 tablet, Rfl: 0 .  nitroGLYCERIN (NITROSTAT) 0.4 MG SL tablet, PLACE 1 TABLET UNDER TONGUE EVERY 5 MIN AS NEEDED FOR CHEST PAIN IF NO RELIEF IN15 MIN CALL 911 (MAX 3 TABS), Disp: 25 tablet, Rfl: 1 .  omeprazole (PRILOSEC) 20 MG capsule, Take 1 capsule (20 mg total) by mouth daily as needed., Disp: 90 capsule, Rfl: 3 .  terconazole (TERAZOL 7) 0.4 % vaginal cream, Apply to affected areas of vulva daily for itching as needed, Disp: 45 g, Rfl: 0  Past Medical History: Past Medical History:  Diagnosis Date   . Allergic rhinitis   . Anemia   . Anxiety   . GERD (gastroesophageal reflux disease)   . History of radiation therapy 04/29/16- 05/24/16   Left Breast 40.05 Gy in 15 fractions, Left Breast boost 10 Gy in 5 fractions.   . Hyperlipidemia   . Hypertension    treated by Dr Thurnell Garbe recently with syncope episode  . Osteopenia   . Osteoporosis   . Personal history of radiation therapy 2018    Tobacco Use: Social History   Tobacco Use  Smoking Status Former Smoker  . Quit date: 03/11/1968  . Years since quitting: 51.5  Smokeless Tobacco Never Used    Labs: Recent Chemical engineer    Labs for ITP Cardiac and Pulmonary Rehab Latest Ref Rng & Units 02/11/2017 03/31/2017 10/17/2017 04/08/2018 04/14/2019   Cholestrol 0 - 200 mg/dL 173 125 121 140 200   LDLCALC 0 - 99 mg/dL 100(H) 56 51 66 121(H)   LDLDIRECT mg/dL - - - - -   HDL >39.00 mg/dL 53 50.50 53 54.00 55.50   Trlycerides 0 - 149 mg/dL 98 92.0 83 101.0 117.0   Hemoglobin A1c 4.6 - 6.5 % - - - - 6.2   TCO2 0 - 100 mmol/L - - - - -       Exercise Target Goals: Exercise Program Goal: Individual exercise prescription set using results from  initial 6 min walk test and THRR while considering  patient's activity barriers and safety.   Exercise Prescription Goal: Initial exercise prescription builds to 30-45 minutes a day of aerobic activity, 2-3 days per week.  Home exercise guidelines will be given to patient during program as part of exercise prescription that the participant will acknowledge.   Education: Aerobic Exercise & Resistance Training: - Gives group verbal and written instruction on the various components of exercise. Focuses on aerobic and resistive training programs and the benefits of this training and how to safely progress through these programs..   Cardiac Rehab from 05/15/2017 in [facility] Cardiac and Pulmonary Rehab  Date 03/13/17  Educator [facility]  Instruction Review Code 1- Verbalizes Understanding       Education: Exercise & Equipment Safety: - Individual verbal instruction and demonstration of equipment use and safety with use of the equipment.   Cardiac Rehab from 09/22/2019 in [facility] Cardiac and Pulmonary Rehab  Date 06/21/19  Educator [facility]  Instruction Review Code 1- Verbalizes Understanding      Education: Exercise Physiology & General Exercise Guidelines: - Group verbal and written instruction with models to review the exercise physiology of the cardiovascular system and associated critical values. Provides general exercise guidelines with specific guidelines to those with heart or lung disease.    Cardiac Rehab from 09/22/2019 in [facility] Cardiac and Pulmonary Rehab  Date 09/22/19  Educator AS  Instruction Review Code 1- Verbalizes Understanding      Education: Flexibility, Balance, Mind/Body Relaxation: Provides group verbal/written instruction on the benefits of flexibility and balance training, including mind/body exercise modes such as yoga, pilates and tai chi.  Demonstration and skill practice provided.   Cardiac Rehab from 05/15/2017 in [facility] Cardiac and Pulmonary Rehab  Date 05/08/17  Educator AS  Instruction Review Code 1- Verbalizes Understanding      Activity Barriers & Risk Stratification:  Activity Barriers & Cardiac Risk Stratification - 06/21/19 1445      Activity Barriers & Cardiac Risk Stratification   Activity Barriers Back Problems;Deconditioning;Muscular Weakness;Balance Concerns    Cardiac Risk Stratification High           6 Minute Walk:  6 Minute Walk    Row Name 06/21/19 1444 09/20/19 1608       6 Minute Walk   Phase Initial Discharge    Distance 730 feet 900 feet    Distance % Change - 23 %    Distance Feet Change - 170 ft    Walk Time 6 minutes 6 minutes    # of Rest Breaks - 0    MPH 1.38 1.7    METS 1.17 1.64    RPE 12 18    VO2 Peak 4.11 5.75    Symptoms No No    Resting HR 56 bpm 73 bpm    Resting BP 136/64 130/80    Resting Oxygen  Saturation  97 % -    Exercise Oxygen Saturation  during 6 min walk 97 % -    Max Ex. HR 87 bpm 101 bpm    Max Ex. BP 156/74 154/56    2 Minute Post BP 144/74 -           Oxygen Initial Assessment:   Oxygen Re-Evaluation:   Oxygen Discharge (Final Oxygen Re-Evaluation):   Initial Exercise Prescription:  Initial Exercise Prescription - 06/21/19 1400      Date of Initial Exercise RX and Referring Provider   Date 06/21/19    Referring Provider  Ida Rogue MD      Treadmill   MPH 1.3    Grade 0    Minutes 15    METs 2      NuStep   Level 1    SPM 80    Minutes 15    METs 2      Biostep-RELP   Level 1    SPM 50    Minutes 15    METs 2      Prescription Details   Frequency (times per week) 3    Duration Progress to 30 minutes of continuous aerobic without signs/symptoms of physical distress      Intensity   THRR 40-80% of Max Heartrate 88-119    Ratings of Perceived Exertion 11-13    Perceived Dyspnea 0-4      Progression   Progression Continue to progress workloads to maintain intensity without signs/symptoms of physical distress.      Resistance Training   Training Prescription Yes    Weight 3 lb    Reps 10-15           Perform Capillary Blood Glucose checks as needed.  Exercise Prescription Changes:  Exercise Prescription Changes    Row Name 06/21/19 1400 07/06/19 1400 07/15/19 1400 07/28/19 0800 08/12/19 1100     Response to Exercise   Blood Pressure (Admit) 136/64 108/56 122/64 124/56 132/60   Blood Pressure (Exercise) 156/74 150/62 138/64 142/74 126/74   Blood Pressure (Exit) 144/74 122/62 120/60 128/64 124/64   Heart Rate (Admit) 86 bpm 73 bpm 66 bpm 68 bpm 85 bpm   Heart Rate (Exercise) 87 bpm 96 bpm 102 bpm 90 bpm 90 bpm   Heart Rate (Exit) 58 bpm 78 bpm 71 bpm 66 bpm 66 bpm   Oxygen Saturation (Admit) 97 % - - - -   Oxygen Saturation (Exercise) 97 % - - - -   Rating of Perceived Exertion (Exercise) '12 13 13 13 13   '$ Symptoms  fatigue at end none none none none   Comments walk test results - - - -   Duration - Continue with 30 min of aerobic exercise without signs/symptoms of physical distress. Continue with 30 min of aerobic exercise without signs/symptoms of physical distress. Continue with 30 min of aerobic exercise without signs/symptoms of physical distress. Continue with 30 min of aerobic exercise without signs/symptoms of physical distress.   Intensity - THRR unchanged THRR unchanged THRR unchanged THRR unchanged     Progression   Progression - Continue to progress workloads to maintain intensity without signs/symptoms of physical distress. Continue to progress workloads to maintain intensity without signs/symptoms of physical distress. Continue to progress workloads to maintain intensity without signs/symptoms of physical distress. Continue to progress workloads to maintain intensity without signs/symptoms of physical distress.   Average METs - 2.07 2.15 1.91 1.9     Resistance Training   Training Prescription - Yes Yes Yes Yes   Weight - 3 lb  3lb  3lb 3 lb   Reps - 10-15 10-15 10-15 10-15     Interval Training   Interval Training - No No No No     Treadmill   MPH - 1.4 - - -   Grade - 0 - - -   Minutes - 15 - - -   METs - 2.07 - - -     Recumbant Bike   Level - - - 1 -   Watts - - - 11 -   Minutes - - -  15 -   METs - - - 2.57 -     NuStep   Level - '2 3 6 4   '$ SPM - - 80 - 80   Minutes - '15 15 15 15   '$ METs - 2.3 2.3 1.6 1.9     Arm Ergometer   Level - - - 1 -   Minutes - - - 15 -   METs - - - 1.5 -     Biostep-RELP   Level - '1 1 3 '$ -   SPM - - 50 - -   Minutes - '15 15 15 '$ -   METs - '2 2 2 '$ -     Home Exercise Plan   Plans to continue exercise at - - - Home (comment)  walking Home (comment)  walking   Frequency - - - Add 3 additional days to program exercise sessions. Add 3 additional days to program exercise sessions.   Initial Home Exercises Provided - - - 07/08/19 07/08/19   Row  Name 08/26/19 1500 09/08/19 1400 09/20/19 1500         Response to Exercise   Blood Pressure (Admit) 132/62 108/64 120/62     Blood Pressure (Exercise) 134/58 134/60 146/64     Blood Pressure (Exit) 132/58 118/70 124/60     Heart Rate (Admit) 65 bpm 69 bpm 72 bpm     Heart Rate (Exercise) 88 bpm 90 bpm 97 bpm     Heart Rate (Exit) 68 bpm 65 bpm 70 bpm     Rating of Perceived Exertion (Exercise) '13 13 13     '$ Symptoms none none none     Duration Continue with 30 min of aerobic exercise without signs/symptoms of physical distress. Continue with 30 min of aerobic exercise without signs/symptoms of physical distress. Continue with 30 min of aerobic exercise without signs/symptoms of physical distress.     Intensity THRR unchanged THRR unchanged THRR unchanged       Progression   Progression Continue to progress workloads to maintain intensity without signs/symptoms of physical distress. Continue to progress workloads to maintain intensity without signs/symptoms of physical distress. Continue to progress workloads to maintain intensity without signs/symptoms of physical distress.     Average METs 2.3 2.32 2.15       Resistance Training   Training Prescription Yes Yes Yes     Weight 3 lb 3 lb 3 lb     Reps 10-15 10-15 10-15       Interval Training   Interval Training No No No       Treadmill   MPH - 1.7 1.6     Grade - 0.5 0.5     Minutes - 15 15     METs - 2.42 2.32       NuStep   Level '5 5 4     '$ Minutes '15 15 15     '$ METs 2.3 2.3 2.7       T5 Nustep   Level - - 3     Minutes - - 15     METs - - 1.6       Biostep-RELP   Level - - 3     Minutes - - 15     METs - - 2       Home Exercise Plan   Plans to continue exercise at Home (comment)  walking Home (comment)  walking Home (comment)  walking     Frequency Add 3 additional days to  program exercise sessions. Add 3 additional days to program exercise sessions. Add 3 additional days to program exercise sessions.     Initial  Home Exercises Provided 07/08/19 07/08/19 07/08/19            Exercise Comments:   Exercise Goals and Review:  Exercise Goals    Row Name 06/21/19 1447             Exercise Goals   Increase Physical Activity Yes       Intervention Provide advice, education, support and counseling about physical activity/exercise needs.;Develop an individualized exercise prescription for aerobic and resistive training based on initial evaluation findings, risk stratification, comorbidities and participant's personal goals.       Expected Outcomes Short Term: Attend rehab on a regular basis to increase amount of physical activity.;Long Term: Exercising regularly at least 3-5 days a week.;Long Term: Add in home exercise to make exercise part of routine and to increase amount of physical activity.       Increase Strength and Stamina Yes       Intervention Provide advice, education, support and counseling about physical activity/exercise needs.;Develop an individualized exercise prescription for aerobic and resistive training based on initial evaluation findings, risk stratification, comorbidities and participant's personal goals.       Expected Outcomes Short Term: Increase workloads from initial exercise prescription for resistance, speed, and METs.;Short Term: Perform resistance training exercises routinely during rehab and add in resistance training at home;Long Term: Improve cardiorespiratory fitness, muscular endurance and strength as measured by increased METs and functional capacity (6MWT)       Able to understand and use rate of perceived exertion (RPE) scale Yes       Intervention Provide education and explanation on how to use RPE scale       Expected Outcomes Short Term: Able to use RPE daily in rehab to express subjective intensity level;Long Term:  Able to use RPE to guide intensity level when exercising independently       Able to understand and use Dyspnea scale Yes       Intervention Provide  education and explanation on how to use Dyspnea scale       Expected Outcomes Short Term: Able to use Dyspnea scale daily in rehab to express subjective sense of shortness of breath during exertion;Long Term: Able to use Dyspnea scale to guide intensity level when exercising independently       Knowledge and understanding of Target Heart Rate Range (THRR) Yes       Intervention Provide education and explanation of THRR including how the numbers were predicted and where they are located for reference       Expected Outcomes Short Term: Able to state/look up THRR;Long Term: Able to use THRR to govern intensity when exercising independently;Short Term: Able to use daily as guideline for intensity in rehab       Able to check pulse independently Yes       Intervention Provide education and demonstration on how to check pulse in carotid and radial arteries.;Review the importance of being able to check your own pulse for safety during independent exercise       Expected Outcomes Short Term: Able to explain why pulse checking is important during independent exercise;Long Term: Able to check pulse independently and accurately       Understanding of Exercise Prescription Yes       Intervention Provide education, explanation, and written materials on patient's individual exercise prescription  Expected Outcomes Short Term: Able to explain program exercise prescription;Long Term: Able to explain home exercise prescription to exercise independently              Exercise Goals Re-Evaluation :  Exercise Goals Re-Evaluation    Row Name 06/23/19 1341 07/06/19 1411 07/08/19 1318 07/15/19 1415 07/28/19 0847     Exercise Goal Re-Evaluation   Exercise Goals Review Increase Physical Activity;Able to understand and use rate of perceived exertion (RPE) scale;Knowledge and understanding of Target Heart Rate Range (THRR);Understanding of Exercise Prescription;Increase Strength and Stamina;Able to check pulse  independently Increase Physical Activity;Increase Strength and Stamina;Understanding of Exercise Prescription Increase Physical Activity;Increase Strength and Stamina;Understanding of Exercise Prescription Increase Physical Activity;Increase Strength and Stamina;Able to understand and use rate of perceived exertion (RPE) scale;Able to understand and use Dyspnea scale;Knowledge and understanding of Target Heart Rate Range (THRR);Able to check pulse independently;Understanding of Exercise Prescription Increase Physical Activity;Increase Strength and Stamina;Understanding of Exercise Prescription   Comments Reviewed RPE scale, THR and program prescription with pt today.  Pt voiced understanding and was given a copy of goals to take home. Haneefah is off to a good start in rehab.  She is up to 2.3 METs on the NuStep.  We will start to increase her workloads and continue to monitor her progress. Francia is glad to be back in rehab.  She is feeling pretty good and getting her stamina back. Reviewed home exercise with pt today.  Pt plans to walking and staff videos for exercise.  Reviewed THR, pulse, RPE, sign and symptoms, NTG use, and when to call 911 or MD.  Also discussed weather considerations and indoor options.  Pt voiced understanding. Verma has done well in her first weeks of rehab.  She has moved up her levels slightly on all machines.  Staff will monitor progress. Franny continues to do well in rehab.  She is now on level 6 on the NuStep.  We have talked to her about maintaining her pace at 73 spm, but she forgets and we will continue to remind her.  We will continue to monitor her progress.   Expected Outcomes Short: Use RPE daily to regulate intensity. Long: Follow program prescription in THR. Short: Start to increase workloads  Long: Continue to improve stamina. Short: Start to add in exercise on off days from class  Long: Continue to improve stamina. Short : exercise consistently Long: improve MET level Short:  Increase spm on steppers  Long; Continue to improve stamina.   Row Name 08/06/19 1013 08/12/19 1145 08/26/19 1543 08/30/19 1318 09/08/19 1451     Exercise Goal Re-Evaluation   Exercise Goals Review Increase Physical Activity;Increase Strength and Stamina;Understanding of Exercise Prescription Increase Physical Activity;Increase Strength and Stamina;Understanding of Exercise Prescription Increase Physical Activity;Increase Strength and Stamina;Understanding of Exercise Prescription Increase Physical Activity;Increase Strength and Stamina;Understanding of Exercise Prescription Increase Physical Activity;Increase Strength and Stamina;Understanding of Exercise Prescription   Comments [place] is doing well in rehab. She is also doing her leg exercises at home.  She will walk in the house, but does not want to go out by herself.  We talked about walking at the store or mall where there are other people and benches to use.  Or her other option would be to return to the [facility] again. She is going to think about it. Overall, she is feeling better. Anaija has moved to level 4 on NS.  She could not maintain 80 SPM at level 6.  Staff will encourage her to  progress with strength and TM speed/grade. Camielle is doing well in rehab.  She is on level 5 for the NuStep.  We will continue to monitor her progress. Lorin has been able to tell her legs are a lot stronger since she started HT.  She plans to join the [employer] when she graduates.  She has been walking on the TM. Magdeline is doing well. Will continue to focus on increasing workload and improving overall MET level. Continue to monitor.   Expected Outcomes Short: Find a safe place to walk where she feels more comfortable  Long: Continue to improve stamina. Short: coninue to work on walking Long: improve MET level Short: Get back to walking on treadmill  Long; Continue to improve stamina. Short:  continue to walk Long: build overall stamina Short: Increase workloads Long: Aim to  increase overall MET level and increase strength/ endurance   Row Name 09/20/19 1459             Exercise Goal Re-Evaluation   Exercise Goals Review Increase Physical Activity;Increase Strength and Stamina;Understanding of Exercise Prescription       Comments Atiyah graduates this week.  She plans to continue to exercise by going back to the [facility].       Expected Outcomes Continue to exercise indpendently.              Discharge Exercise Prescription (Final Exercise Prescription Changes):  Exercise Prescription Changes - 09/20/19 1500      Response to Exercise   Blood Pressure (Admit) 120/62    Blood Pressure (Exercise) 146/64    Blood Pressure (Exit) 124/60    Heart Rate (Admit) 72 bpm    Heart Rate (Exercise) 97 bpm    Heart Rate (Exit) 70 bpm    Rating of Perceived Exertion (Exercise) 13    Symptoms none    Duration Continue with 30 min of aerobic exercise without signs/symptoms of physical distress.    Intensity THRR unchanged      Progression   Progression Continue to progress workloads to maintain intensity without signs/symptoms of physical distress.    Average METs 2.15      Resistance Training   Training Prescription Yes    Weight 3 lb    Reps 10-15      Interval Training   Interval Training No      Treadmill   MPH 1.6    Grade 0.5    Minutes 15    METs 2.32      NuStep   Level 4    Minutes 15    METs 2.7      T5 Nustep   Level 3    Minutes 15    METs 1.6      Biostep-RELP   Level 3    Minutes 15    METs 2      Home Exercise Plan   Plans to continue exercise at Home (comment)   walking   Frequency Add 3 additional days to program exercise sessions.    Initial Home Exercises Provided 07/08/19           Nutrition:  Target Goals: Understanding of nutrition guidelines, daily intake of sodium '1500mg'$ , cholesterol '200mg'$ , calories 30% from fat and 7% or less from saturated fats, daily to have 5 or more servings of fruits and vegetables.   Education: Controlling Sodium/Reading Food Labels -Group verbal and written material supporting the discussion of sodium use in heart healthy nutrition. Review and explanation with models, verbal and  written materials for utilization of the food label.   Cardiac Rehab from 05/15/2017 in [facility] Cardiac and Pulmonary Rehab  Date 04/15/17  Educator CR  Instruction Review Code 1- [employer] Understanding      Education: General Nutrition Guidelines/Fats and Fiber: -Group instruction provided by verbal, written material, models and posters to present the general guidelines for heart healthy nutrition. Gives an explanation and review of dietary fats and fiber.   Cardiac Rehab from 05/15/2017 in [facility] Cardiac and Pulmonary Rehab  Date 04/08/17  Educator CR  Instruction Review Code 1- Verbalizes Understanding      Biometrics:  Pre Biometrics - 06/21/19 1448      Pre Biometrics   Height '5\' 2"'$  (1.575 m)    Weight 133 lb 11.2 oz (60.6 kg)    BMI (Calculated) 24.45    Single Leg Stand 3.22 seconds            Nutrition Therapy Plan and Nutrition Goals:  Nutrition Therapy & Goals - 08/16/19 1234      Nutrition Therapy   Diet Low Na, heart healthy    Drug/Food Interactions Statins/Certain Fruits    Protein (specify units) 45-50g    Fiber 25 grams    Whole Grain Foods 2 servings    Saturated Fats 12 max. grams    Fruits and Vegetables 4 servings/day    Sodium 1.5 grams      Personal Nutrition Goals   Nutrition Goal ST: try bottle water instead of salt treated water. LT: gain strength back in legs    Comments Bowl of cereal frosted flakes (lactaid milk) with coffee and milk. L: goes out for lunch, likes apple jack chicken at [employer] with salad with mashed potatoes. Sweet tea - 2 glasses. D:sometimes leftovers or crackers. water and tea. Also will have boost during the day. Pt reports usisng salt to filter hard water and that is what she uses to make coffee and cook, thinks her BP is high  - lunch is high in Na, but not unlikely a significant portion paired with the rest of the day to exceed recommendations. Discussed heart healty eating.           Nutrition Assessments:  Nutrition Assessments - 06/21/19 1449      MEDFICTS Scores   Pre Score 24           MEDIFICTS Score Key:          ?70 Need to make dietary changes          40-70 Heart Healthy Diet         ? 40 Therapeutic Level Cholesterol Diet  Nutrition Goals Re-Evaluation:  Nutrition Goals Re-Evaluation    [place] Name 08/30/19 1323             Goals   Nutrition Goal Jya is trying to drink more water.  She feels she has gained strength in her legs.       Expected Outcome Short:  continue to drink water for hydration Long: maintain weight              Nutrition Goals Discharge (Final Nutrition Goals Re-Evaluation):  Nutrition Goals Re-Evaluation - 08/30/19 1323      Goals   Nutrition Goal Cythnia is trying to drink more water.  She feels she has gained strength in her legs.    Expected Outcome Short:  continue to drink water for hydration Long: maintain weight           Psychosocial:  Target Goals: Acknowledge presence or absence of significant depression and/or stress, maximize coping skills, provide positive support system. Participant is able to verbalize types and ability to use techniques and skills needed for reducing stress and depression.   Education: Depression - Provides group verbal and written instruction on the correlation between heart/lung disease and depressed mood, treatment options, and the stigmas associated with seeking treatment.   Cardiac Rehab from 05/15/2017 in [facility] Cardiac and Pulmonary Rehab  Date 04/29/17  Educator [facility]  Instruction Review Code 1- [employer] Understanding      Education: Sleep Hygiene -Provides group verbal and written instruction about how sleep can affect your health.  Define sleep hygiene, discuss sleep cycles and impact of sleep habits. Review good  sleep hygiene tips.    Cardiac Rehab from 05/15/2017 in [facility] Cardiac and Pulmonary Rehab  Date 04/10/17  Educator [facility]  Instruction Review Code 1- Verbalizes Understanding       Education: Stress and Anxiety: - Provides group verbal and written instruction about the health risks of elevated stress and causes of high stress.  Discuss the correlation between heart/lung disease and anxiety and treatment options. Review healthy ways to manage with stress and anxiety.   Cardiac Rehab from 05/15/2017 in [facility] Cardiac and Pulmonary Rehab  Date 05/13/17  Educator [facility]  Instruction Review Code 1- Verbalizes Understanding       Initial Review & Psychosocial Screening:  Initial Psych Review & Screening - 06/17/19 1109      Initial Review   Current issues with None Identified      Family Dynamics   Good Support System? Yes   children     Barriers   Psychosocial barriers to participate in program There are no identifiable barriers or psychosocial needs.;The patient should benefit from training in stress management and relaxation.      Screening Interventions   Expected Outcomes Short Term goal: Utilizing psychosocial counselor, staff and physician to assist with identification of specific Stressors or current issues interfering with healing process. Setting desired goal for each stressor or current issue identified.;Long Term Goal: Stressors or current issues are controlled or eliminated.;Short Term goal: Identification and review with participant of any Quality of Life or Depression concerns found by scoring the questionnaire.;Long Term goal: The participant improves quality of Life and PHQ9 Scores as seen by post scores and/or verbalization of changes           Quality of Life Scores:   Quality of Life - 09/20/19 1441      Quality of Life   Select Quality of Life      Quality of Life Scores   Health/Function Pre 27.43 %    Health/Function Post 24.39 %    Health/Function % Change -11.08 %     Socioeconomic Pre 30 %    Socioeconomic Post 30 %    Socioeconomic % Change  0 %    Psych/Spiritual Pre 30 %    Psych/Spiritual Post 24.86 %    Psych/Spiritual % Change -17.13 %    Family Pre 30 %    Family Post 30 %    Family % Change 0 %    GLOBAL Pre 28.8 %    GLOBAL Post 26.31 %    GLOBAL % Change -8.65 %          Scores of 19 and below usually indicate a poorer quality of life in these areas.  A difference of  2-3 points is a clinically meaningful difference.  A  difference of 2-3 points in the total score of the Quality of Life Index has been associated with significant improvement in overall quality of life, self-image, physical symptoms, and general health in studies assessing change in quality of life.  PHQ-9: Recent Review Flowsheet Data    Depression screen [facility] 2/9 09/20/2019 06/21/2019 04/14/2019 04/08/2018 05/13/2017   Decreased Interest 0 0 0 0 0   Down, Depressed, Hopeless 0 0 0 0 0   PHQ - 2 Score 0 0 0 0 0   Altered sleeping 0 0 0 0 0   Tired, decreased energy 1 1 0 0 0   Change in appetite 0 0 0 0 0   Feeling bad or failure about yourself  0 0 0 0 0   Trouble concentrating 0 0 0 0 0   Moving slowly or fidgety/restless 0 0 0 0 0   Suicidal thoughts 0 0 0 0 0   PHQ-9 Score 1 1 0 0 0   Difficult doing work/chores Not difficult at all Somewhat difficult Not difficult at all Not difficult at all Not difficult at all     Interpretation of Total Score  Total Score Depression Severity:  1-4 = Minimal depression, 5-9 = Mild depression, 10-14 = Moderate depression, 15-19 = Moderately severe depression, 20-27 = Severe depression   Psychosocial Evaluation and Intervention:  Psychosocial Evaluation - 06/17/19 1109      Psychosocial Evaluation & Interventions   Interventions Encouraged to exercise with the program and follow exercise prescription    Comments Deyanna reports doing well. She has a great support system of children and grandchildren. She states doing fine during  the pandemic, staying home. She sometimes still struggles with appetite, but she is working on it. She wants to come to the program to get  stronger and feel like she has increased stamina    Expected Outcomes Short: attend Cardiac Rehab for exercise and education. Long: maintain positive self care habits.    Continue Psychosocial Services  Follow up required by staff           Psychosocial Re-Evaluation:  Psychosocial Re-Evaluation    Row Name 07/08/19 1318 08/06/19 1015 08/30/19 1320         Psychosocial Re-Evaluation   Current issues with None Identified Current Stress Concerns Current Stress Concerns     Comments Riyanshi is doing well mentally.  She continues to sleep well.  Her grandson has moved out since she was here last.  Overall she stays positive. Shaneque is doing well mentally still. She is lonely so we talked about getting out to walk more and see other people.  She has started back to church now in person, so that is helping. She continues to sleep well. Ginna still enjoys seeing people at church.  She lives alone.  Staff recommended the [facility] for her to continue exercise for the social aspect.     Expected Outcomes Short: Continue to exercise for more stamina  Long: Continue to stay positive. - Short: continue to attend HT Long: maintain positive outlook     Interventions Encouraged to attend Cardiac Rehabilitation for the exercise - -     Continue Psychosocial Services  Follow up required by staff - -            Psychosocial Discharge (Final Psychosocial Re-Evaluation):  Psychosocial Re-Evaluation - 08/30/19 1320      Psychosocial Re-Evaluation   Current issues with Current Stress Concerns    Comments Deone still enjoys seeing  people at church.  She lives alone.  Staff recommended the [facility] for her to continue exercise for the social aspect.    Expected Outcomes Short: continue to attend HT Long: maintain positive outlook           Vocational Rehabilitation:  Provide vocational rehab assistance to qualifying candidates.   Vocational Rehab Evaluation & Intervention:  Vocational Rehab - 06/17/19 1105      Initial Vocational Rehab Evaluation & Intervention   Assessment shows need for Vocational Rehabilitation No           Education: Education Goals: Education classes will be provided on a variety of topics geared toward better understanding of heart health and risk factor modification. Participant will state understanding/return demonstration of topics presented as noted by education test scores.  Learning Barriers/Preferences:  Learning Barriers/Preferences - 06/17/19 1105      Learning Barriers/Preferences   Learning Barriers Hearing    Learning Preferences None           General Cardiac Education Topics:  AED/CPR: - Group verbal and written instruction with the use of models to demonstrate the basic use of the AED with the basic ABC's of resuscitation.   Cardiac Rehab from 05/15/2017 in [facility] Cardiac and Pulmonary Rehab  Date 04/22/17  Educator SB  Instruction Review Code 1- Verbalizes Understanding      Anatomy & Physiology of the Heart: - Group verbal and written instruction and models provide basic cardiac anatomy and physiology, with the coronary electrical and arterial systems. Review of Valvular disease and Heart Failure   Cardiac Rehab from 05/15/2017 in [facility] Cardiac and Pulmonary Rehab  Date 04/03/17  Educator CE  Instruction Review Code 1- Verbalizes Understanding      Cardiac Procedures: - Group verbal and written instruction to review commonly prescribed medications for heart disease. Reviews the medication, class of the drug, and side effects. Includes the steps to properly store meds and maintain the prescription regimen. (beta blockers and nitrates)   Cardiac Rehab from 05/15/2017 in [facility] Cardiac and Pulmonary Rehab  Date 04/17/17  Educator CE  Instruction Review Code 1- Verbalizes Understanding      Cardiac  Medications I: - Group verbal and written instruction to review commonly prescribed medications for heart disease. Reviews the medication, class of the drug, and side effects. Includes the steps to properly store meds and maintain the prescription regimen.   Cardiac Medications II: -Group verbal and written instruction to review commonly prescribed medications for heart disease. Reviews the medication, class of the drug, and side effects. (all other drug classes)   Cardiac Rehab from 05/15/2017 in [facility] Cardiac and Pulmonary Rehab  Date 05/15/17  [facility] Factors]  Educator [facility]  Instruction Review Code 1- Verbalizes Understanding       Go Sex-Intimacy & Heart Disease, Get SMART - Goal Setting: - Group verbal and written instruction through game format to discuss heart disease and the return to sexual intimacy. Provides group verbal and written material to discuss and apply goal setting through the application of the S.M.A.R.T. Method.   Cardiac Rehab from 05/15/2017 in [facility] Cardiac and Pulmonary Rehab  Date 04/17/17  Educator CE  Instruction Review Code 1- Verbalizes Understanding      Other Matters of the Heart: - Provides group verbal, written materials and models to describe Stable Angina and Peripheral Artery. Includes description of the disease process and treatment options available to the cardiac patient.   Cardiac Rehab from 05/15/2017 in [facility] Cardiac and Pulmonary Rehab  Date 04/03/17  Educator CE  Instruction Review Code 1- Verbalizes Understanding      Infection Prevention: - Provides verbal and written material to individual with discussion of infection control including proper hand washing and proper equipment cleaning during exercise session.   Cardiac Rehab from 09/22/2019 in [facility] Cardiac and Pulmonary Rehab  Date 06/21/19  Educator [facility]  Instruction Review Code 1- Verbalizes Understanding      Falls Prevention: - Provides verbal and written material to individual with  discussion of falls prevention and safety.   Cardiac Rehab from 09/22/2019 in [facility] Cardiac and Pulmonary Rehab  Date 06/21/19  Educator [facility]  Instruction Review Code 1- Verbalizes Understanding      Other: -Provides group and verbal instruction on various topics (see comments)   Knowledge Questionnaire Score:  Knowledge Questionnaire Score - 09/20/19 1439      Knowledge Questionnaire Score   Post Score 22/26           Core Components/Risk Factors/Patient Goals at Admission:  Personal Goals and Risk Factors at Admission - 06/21/19 1449      Core Components/Risk Factors/Patient Goals on Admission    Weight Management Yes;Weight Maintenance    Intervention Weight Management: Develop a combined nutrition and exercise program designed to reach desired caloric intake, while maintaining appropriate intake of nutrient and fiber, sodium and fats, and appropriate energy expenditure required for the weight goal.;Weight Management: Provide education and appropriate resources to help participant work on and attain dietary goals.    Admit Weight 133 lb 11.2 oz (60.6 kg)    Goal Weight: Short Term 133 lb (60.3 kg)    Goal Weight: Long Term 133 lb (60.3 kg)    Expected Outcomes Short Term: Continue to assess and modify interventions until short term weight is achieved;Long Term: Adherence to nutrition and physical activity/exercise program aimed toward attainment of established weight goal;Weight Maintenance: Understanding of the daily nutrition guidelines, which includes 25-35% calories from fat, 7% or less cal from saturated fats, less than '200mg'$  cholesterol, less than 1.5gm of sodium, & 5 or more servings of fruits and vegetables daily    Hypertension Yes    Intervention Provide education on lifestyle modifcations including regular physical activity/exercise, weight management, moderate sodium restriction and increased consumption of fresh fruit, vegetables, and low fat dairy, alcohol moderation,  and smoking cessation.;Monitor prescription use compliance.    Expected Outcomes Short Term: Continued assessment and intervention until BP is < 140/93m HG in hypertensive participants. < 130/868mHG in hypertensive participants with diabetes, heart failure or chronic kidney disease.;Long Term: Maintenance of blood pressure at goal levels.    Lipids Yes    Intervention Provide education and support for participant on nutrition & aerobic/resistive exercise along with prescribed medications to achieve LDL '70mg'$ , HDL >'40mg'$ .    Expected Outcomes Short Term: Participant states understanding of desired cholesterol values and is compliant with medications prescribed. Participant is following exercise prescription and nutrition guidelines.;Long Term: Cholesterol controlled with medications as prescribed, with individualized exercise RX and with personalized nutrition plan. Value goals: LDL < '70mg'$ , HDL > 40 mg.           Education:Diabetes - Individual verbal and written instruction to review signs/symptoms of diabetes, desired ranges of glucose level fasting, after meals and with exercise. Acknowledge that pre and post exercise glucose checks will be done for 3 sessions at entry of program.   Education: Know Your Numbers and Risk Factors: -Group verbal and written instruction about important numbers in your  health.  Discussion of what are risk factors and how they play a role in the disease process.  Review of Cholesterol, Blood Pressure, Diabetes, and BMI and the role they play in your overall health.   Cardiac Rehab from 05/15/2017 in [facility] Cardiac and Pulmonary Rehab  Date 05/15/17  [facility] Factors]  Educator [facility]  Instruction Review Code 1- Verbalizes Understanding      Core Components/Risk Factors/Patient Goals Review:   Goals and Risk Factor Review    Row Name 07/08/19 1319 08/06/19 1034 08/30/19 1316         Core Components/Risk Factors/Patient Goals Review   Personal Goals Review Weight  Management/Obesity;Hypertension;Lipids Weight Management/Obesity;Hypertension;Lipids Weight Management/Obesity;Hypertension;Lipids     Review Harla is off to a good start in rehab. Her weight is staying steady and doing well.  Her pressures have been doing okay since her medications changed.  She continues to check them at home. Noel is doing well.  Her weight is steady and she feels good where she is.  Her pressures have continued to do wel and she does monitor them on her own. Evania states she is taking all meds as directed.  She does check BP at home when she doesnt come to HT.  She can tell she has more strength, especially in her legs.     Expected Outcomes Short: Continue to monitor pressures  Long: Continue to monitor risk factors. Short:Continue to maintain weight  Long: Continue to monitor risk factors. Short: continue to exercise and maintain weight Long: manage risk factors            Core Components/Risk Factors/Patient Goals at Discharge (Final Review):   Goals and Risk Factor Review - 08/30/19 1316      Core Components/Risk Factors/Patient Goals Review   Personal Goals Review Weight Management/Obesity;Hypertension;Lipids    Review Nesreen states she is taking all meds as directed.  She does check BP at home when she doesnt come to HT.  She can tell she has more strength, especially in her legs.    Expected Outcomes Short: continue to exercise and maintain weight Long: manage risk factors           ITP Comments:  ITP Comments    Row Name 06/17/19 1108 06/21/19 1440 06/23/19 1340 06/30/19 0538 07/28/19 0553   ITP Comments Initial telephone encounter completed. Diagnosis can be found in [facility] 3/12. EP orientation scheduled for 4/12 at 11am Completed 6MWT and gym orientation.  Initial ITP created and sent for review to Dr. Emily Filbert, Medical Director. First full day of exercise!  Patient was oriented to gym and equipment including functions, settings, policies, and procedures.   Patient's individual exercise prescription and treatment plan were reviewed.  All starting workloads were established based on the results of the 6 minute walk test done at initial orientation visit.  The plan for exercise progression was also introduced and progression will be customized based on patient's performance and goals. 30 Day review completed. Medical Director review done, changes made as directed,and approval shown by signature of Market researcher.  New to program 30 Day review completed. ITP review done, changes made as directed,and approval shown by signature of  Scientist, research (life sciences).   [place] Name 08/25/19 0544 09/22/19 0636 09/22/19 1348       ITP Comments 30 Day review completed. Medical Director ITP review done, changes made as directed, and signed approval by Medical Director. 30 Day review completed. Medical Director ITP review done, changes made as directed, and  signed approval by Medical Director. Etola graduated today from  rehab with 36 sessions completed.  Details of the patient's exercise prescription and what She needs to do in order to continue the prescription and progress were discussed with patient.  Patient was given a copy of prescription and goals.  Patient verbalized understanding.  Correna plans to continue to exercise by walking at home.            Comments: Discharge ITP

## 2019-09-24 ENCOUNTER — Other Ambulatory Visit: Payer: Self-pay

## 2019-09-24 ENCOUNTER — Ambulatory Visit
Admission: RE | Admit: 2019-09-24 | Discharge: 2019-09-24 | Disposition: A | Discharge status: Home / Self Care | Payer: Medicare Other | Source: Ambulatory Visit | Attending: Family Medicine | Admitting: Family Medicine

## 2019-09-24 DIAGNOSIS — Z853 Personal history of malignant neoplasm of breast: Secondary | ICD-10-CM | POA: Diagnosis not present

## 2019-09-24 DIAGNOSIS — R928 Other abnormal and inconclusive findings on diagnostic imaging of breast: Secondary | ICD-10-CM | POA: Diagnosis not present

## 2019-09-24 DIAGNOSIS — Z9889 Other specified postprocedural states: Secondary | ICD-10-CM

## 2019-10-10 NOTE — Progress Notes (Signed)
[place]  [phone] [phone]  ID: Jenna Young OB: [birth year]  MR#: [id number]  IRC#:[id number]  Patient Care Team: Abner Greenspan, MD as PCP - General Rockey Situ Kathlene November, MD as PCP - Cardiology (Cardiology) Lloyd Huger, MD as Consulting Physician (Oncology)  CHIEF COMPLAINT: Iron deficiency anemia.  INTERVAL HISTORY: Patient returns to clinic today for repeat laboratory, further evaluation, and consideration of additional IV Feraheme.  She currently feels well and is asymptomatic.  She does not complain of any weakness or fatigue. She has no neurologic complaints.  She denies any recent fevers or illnesses. She has no chest pain, shortness of breath, cough, or hemoptysis.  She denies any nausea, vomiting, constipation, or diarrhea.  She has no melena or hematochezia.  She has no urinary complaints.  Patient offers no specific complaints today.  REVIEW OF SYSTEMS:   Review of Systems  Constitutional: Negative.  Negative for fever, malaise/fatigue and weight loss.  Respiratory: Negative.  Negative for cough and shortness of breath.   Cardiovascular: Negative.  Negative for chest pain and leg swelling.  Gastrointestinal: Negative.  Negative for abdominal pain, blood in stool and melena.  Genitourinary: Negative.  Negative for dysuria and hematuria.  Musculoskeletal: Negative.  Negative for back pain and myalgias.  Skin: Negative.  Negative for rash.  Neurological: Negative.  Negative for dizziness, focal weakness, weakness and headaches.  Psychiatric/Behavioral: Negative.  The patient is not nervous/anxious.     As per HPI. Otherwise, a complete review of systems is negative.  PAST MEDICAL HISTORY: Past Medical History:  Diagnosis Date   Allergic rhinitis    Anemia    Anxiety    GERD (gastroesophageal reflux disease)    History of radiation therapy 04/29/16- 05/24/16   Left Breast 40.05 Gy in 15 fractions, Left Breast  boost 10 Gy in 5 fractions.    Hyperlipidemia    Hypertension    treated by Dr Thurnell Garbe recently with syncope episode   Osteopenia    Osteoporosis    Personal history of radiation therapy 2018    PAST SURGICAL HISTORY: Past Surgical History:  Procedure Laterality Date   APPENDECTOMY     BREAST BIOPSY Left 11/29/2008   BREAST BIOPSY Left 01/26/2016   malignant   BREAST EXCISIONAL BIOPSY Left 12/26/2008   BREAST LUMPECTOMY Left 02/27/2016   BREAST LUMPECTOMY Left 04/01/2016   BREAST LUMPECTOMY WITH RADIOACTIVE SEED LOCALIZATION Left 02/27/2016   Procedure: LEFT BREAST LUMPECTOMY WITH RADIOACTIVE SEED LOCALIZATION;  Surgeon: Autumn Messing III, MD;  Location: [facility];  Service: General;  Laterality: Left;   BREAST SURGERY  10/10   benign biopsy  negative   CESAREAN SECTION     CHOLECYSTECTOMY     CORONARY STENT INTERVENTION N/A 02/11/2017   Procedure: CORONARY STENT INTERVENTION;  Surgeon: Nelva Bush, MD;  Location: [facility];  Service: Cardiovascular;  Laterality: N/A;   FEMUR FRACTURE SURGERY Right 2006   LEFT HEART CATH AND CORONARY ANGIOGRAPHY N/A 02/11/2017   Procedure: LEFT HEART CATH AND CORONARY ANGIOGRAPHY;  Surgeon: Nelva Bush, MD;  Location: [facility];  Service: Cardiovascular;  Laterality: N/A;   LEFT HEART CATH AND CORONARY ANGIOGRAPHY N/A 05/21/2019   Procedure: LEFT HEART CATH AND CORONARY ANGIOGRAPHY;  Surgeon: Wellington Hampshire, MD;  Location: [facility];  Service: Cardiovascular;  Laterality: N/A;   OVARIAN CYST SURGERY     RE-EXCISION OF BREAST LUMPECTOMY Left 04/01/2016   Procedure: RE-EXCISION OF LEFT BREAST  MEDIAL MARGIN;  Surgeon: Autumn Messing III, MD;  Location: [facility];  Service: General;  Laterality: Left;    FAMILY HISTORY: Family History  Problem Relation Age of Onset   Hypertension Mother    Heart disease Father    Macular degeneration Sister     Diabetes Maternal Aunt    Cancer Maternal Aunt        throat    ADVANCED DIRECTIVES (Y/N):  N  HEALTH MAINTENANCE: Social History   Tobacco Use   Smoking status: Former Smoker    Quit date: 03/11/1968    Years since quitting: 51.6   Smokeless tobacco: Never Used  Vaping Use   Vaping Use: Never used  Substance Use Topics   Alcohol use: No    Alcohol/week: 0.0 standard drinks   Drug use: No     Colonoscopy:  PAP:  Bone density:  Lipid panel:  Allergies  Allergen Reactions   Buspirone Hcl     REACTION: HEART PALPITATIONS   Codeine     REACTION: nausea and vomiting   Hydrochlorothiazide     REACTION: syncope-possibly from dehydration   Iron     Oral iron -per pt made her "pass out"    Lansoprazole     REACTION: abd pain   Minocycline Hcl    Penicillins     REACTION: mouth numbness Has patient had a PCN reaction causing immediate rash, facial/tongue/throat swelling, SOB or lightheadedness with hypotension: No Has patient had a PCN reaction causing severe rash involving mucus membranes or skin necrosis: No Has patient had a PCN reaction that required hospitalization: No Has patient had a PCN reaction occurring within the last 10 years: No If all of the above answers are "NO", then may proceed with Cephalosporin use.   Phenergan [Promethazine Hcl] Other (See Comments)    Body shakes, hallucinations   Ramipril     Cough    Ranexa [Ranolazine]     Constipation    Sulfa Antibiotics Nausea Only    Current Outpatient Medications  Medication Sig Dispense Refill   aspirin (ASPIRIN EC) 81 MG EC tablet Take 81 mg by mouth daily. Swallow whole.      atorvastatin (LIPITOR) 40 MG tablet Take 1 tablet (40 mg total) by mouth daily at 6 PM. 90 tablet 0   Cholecalciferol (VITAMIN D) 2000 UNITS CAPS Take 1 capsule by mouth daily.       clopidogrel (PLAVIX) 75 MG tablet Take 1 tablet (75 mg total) by mouth daily with breakfast. 90 tablet 3   cyanocobalamin  (,VITAMIN B-12,) 1000 MCG/ML injection Inject 1,000 mcg into the muscle every 3 (three) months.     isosorbide mononitrate (IMDUR) 30 MG 24 hr tablet Take 0.5 tablets (15 mg total) by mouth daily. 45 tablet 3   losartan (COZAAR) 25 MG tablet TAKE 1 TABLET(25 MG) BY MOUTH DAILY 90 tablet 0   nitroGLYCERIN (NITROSTAT) 0.4 MG SL tablet PLACE 1 TABLET UNDER TONGUE EVERY 5 MIN AS NEEDED FOR CHEST PAIN IF NO RELIEF IN15 MIN CALL 911 (MAX 3 TABS) 25 tablet 1   omeprazole (PRILOSEC) 20 MG capsule Take 1 capsule (20 mg total) by mouth daily as needed. 90 capsule 3   terconazole (TERAZOL 7) 0.4 % vaginal cream Apply to affected areas of vulva daily for itching as needed 45 g 0   carvedilol (COREG) 3.125 MG tablet Take 1 tablet (3.125 mg total) by mouth 2 (two) times daily. 180 tablet 3   No current facility-administered  medications for this visit.    OBJECTIVE: Vitals:   10/15/19 1432 10/15/19 1433  BP: (!) 175/65 (!) 185/65  Pulse: 66 67  Temp: 98.4 F (36.9 C)   SpO2: 99%      Body mass index is 25.56 kg/m.    ECOG FS:0 - Asymptomatic  General: Well-developed, well-nourished, no acute distress. Eyes: Pink conjunctiva, anicteric sclera. HEENT: Normocephalic, moist mucous membranes. Lungs: No audible wheezing or coughing. Heart: Regular rate and rhythm. Abdomen: Soft, nontender, no obvious distention. Musculoskeletal: No edema, cyanosis, or clubbing. Neuro: Alert, answering all questions appropriately. Cranial nerves grossly intact. Skin: No rashes or petechiae noted. Psych: Normal affect.    LAB RESULTS:  Lab Results  Component Value Date   NA 136 05/21/2019   K 4.7 05/21/2019   CL 105 05/21/2019   CO2 23 05/21/2019   GLUCOSE 111 (H) 05/21/2019   BUN 6 (L) 05/21/2019   CREATININE 0.81 05/21/2019   CALCIUM 8.9 05/21/2019   PROT 8.8 (H) 05/21/2019   ALBUMIN 4.2 05/21/2019   AST 26 05/21/2019   ALT 15 05/21/2019   ALKPHOS 73 05/21/2019   BILITOT 0.8 05/21/2019    GFRNONAA >60 05/21/2019   GFRAA >60 05/21/2019    Lab Results  Component Value Date   WBC 5.7 10/15/2019   NEUTROABS 2.7 10/15/2019   HGB 11.0 (L) 10/15/2019   HCT 33.1 (L) 10/15/2019   MCV 83.4 10/15/2019   PLT 191 10/15/2019   Lab Results  Component Value Date   IRON 70 10/15/2019   TIBC 281 10/15/2019   IRONPCTSAT 25 10/15/2019   Lab Results  Component Value Date   FERRITIN 93 10/15/2019    STUDIES: MM DIAG BREAST TOMO BILATERAL  Result Date: 09/24/2019 CLINICAL DATA:  Status post left lumpectomy and radiation therapy for breast cancer in 2017. EXAM: DIGITAL DIAGNOSTIC BILATERAL MAMMOGRAM WITH TOMO AND CAD COMPARISON:  Previous exam(s). ACR Breast Density Category b: There are scattered areas of fibroglandular density. FINDINGS: Stable post lumpectomy changes on the left. No interval findings suspicious for malignancy in either breast. Mammographic images were processed with CAD. IMPRESSION: No evidence of malignancy. RECOMMENDATION: Bilateral diagnostic mammogram in 1 year. I have discussed the findings and recommendations with the patient. If applicable, a reminder letter will be sent to the patient regarding the next appointment. BI-RADS CATEGORY  2: Benign. Electronically Signed   By: Claudie Revering M.D.   On: 09/24/2019 12:13    ASSESSMENT: Iron deficiency anemia.  PLAN:   1.  Iron deficiency anemia: Patient's hemoglobin has trended down slightly to 11.0, but her iron stores continue to be within normal limits. Previously, all of her other laboratory work was either negative or within normal limits.  She does not require additional IV Feraheme today.  Patient last received treatment on May 26, 2019.  Return to clinic in 3 months with repeat laboratory work, further evaluation, and continuation of treatment if needed. 2.  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  Final pathology was  noninvasive therefore she did not require chemotherapy.  Tamoxifen also would not be of benefit given the ER/PR status of her tumor.  Her most recent mammogram on September 24, 2019 was reported as BI-RADS 2.  Repeat in July 2022. Patient has requested that her primary care physician continue to order to monitor her mammograms. 3.  B12 deficiency: Continue monthly B12 injections with primary care as needed. 4.  Cardiac disease: Patient  had NSTEMI in March 2021 and underwent cardiac catheterization.  Continue follow-up with cardiology and primary care as scheduled.   Patient expressed understanding and was in agreement with this plan. She also understands that She can call clinic at any time with any questions, concerns, or complaints.   Cancer Staging History of breast cancer Staging form: Breast, AJCC 7th Edition - Clinical stage from 01/26/2016: Stage 0 (Tis (DCIS), N0, M0) - Signed by Truitt Merle, MD on 02/26/2016 - Pathologic: Stage 0 (Tis (DCIS), N0, cM0) - Unsigned   Lloyd Huger, MD   10/16/2019 8:07 AM

## 2019-10-12 ENCOUNTER — Other Ambulatory Visit: Payer: Self-pay

## 2019-10-12 DIAGNOSIS — D509 Iron deficiency anemia, unspecified: Secondary | ICD-10-CM

## 2019-10-15 ENCOUNTER — Inpatient Hospital Stay: Payer: Medicare Other | Attending: Oncology

## 2019-10-15 ENCOUNTER — Other Ambulatory Visit: Payer: Self-pay

## 2019-10-15 ENCOUNTER — Inpatient Hospital Stay (HOSPITAL_BASED_OUTPATIENT_CLINIC_OR_DEPARTMENT_OTHER): Payer: Medicare Other | Admitting: Oncology

## 2019-10-15 ENCOUNTER — Inpatient Hospital Stay: Payer: Medicare Other

## 2019-10-15 VITALS — BP 185/65 | HR 67 | Temp 98.4°F | Wt 130.9 lb

## 2019-10-15 DIAGNOSIS — D509 Iron deficiency anemia, unspecified: Secondary | ICD-10-CM

## 2019-10-15 DIAGNOSIS — Z86000 Personal history of in-situ neoplasm of breast: Secondary | ICD-10-CM | POA: Insufficient documentation

## 2019-10-15 DIAGNOSIS — E538 Deficiency of other specified B group vitamins: Secondary | ICD-10-CM | POA: Insufficient documentation

## 2019-10-15 DIAGNOSIS — I2510 Atherosclerotic heart disease of native coronary artery without angina pectoris: Secondary | ICD-10-CM

## 2019-10-15 DIAGNOSIS — Z87891 Personal history of nicotine dependence: Secondary | ICD-10-CM | POA: Insufficient documentation

## 2019-10-15 DIAGNOSIS — Z8249 Family history of ischemic heart disease and other diseases of the circulatory system: Secondary | ICD-10-CM | POA: Diagnosis not present

## 2019-10-15 DIAGNOSIS — Z833 Family history of diabetes mellitus: Secondary | ICD-10-CM | POA: Insufficient documentation

## 2019-10-15 DIAGNOSIS — I252 Old myocardial infarction: Secondary | ICD-10-CM | POA: Diagnosis not present

## 2019-10-15 LAB — CBC WITH DIFFERENTIAL/PLATELET
Abs Immature Granulocytes: 0.01 10*3/uL (ref 0.00–0.07)
Basophils Absolute: 0 10*3/uL (ref 0.0–0.1)
Basophils Relative: 0 %
Eosinophils Absolute: 0.1 10*3/uL (ref 0.0–0.5)
Eosinophils Relative: 2 %
HCT: 33.1 % — ABNORMAL LOW (ref 36.0–46.0)
Hemoglobin: 11 g/dL — ABNORMAL LOW (ref 12.0–15.0)
Immature Granulocytes: 0 %
Lymphocytes Relative: 39 %
Lymphs Abs: 2.2 10*3/uL (ref 0.7–4.0)
MCH: 27.7 pg (ref 26.0–34.0)
MCHC: 33.2 g/dL (ref 30.0–36.0)
MCV: 83.4 fL (ref 80.0–100.0)
Monocytes Absolute: 0.7 10*3/uL (ref 0.1–1.0)
Monocytes Relative: 12 %
Neutro Abs: 2.7 10*3/uL (ref 1.7–7.7)
Neutrophils Relative %: 47 %
Platelets: 191 10*3/uL (ref 150–400)
RBC: 3.97 MIL/uL (ref 3.87–5.11)
RDW: 15.9 % — ABNORMAL HIGH (ref 11.5–15.5)
WBC: 5.7 10*3/uL (ref 4.0–10.5)
nRBC: 0 % (ref 0.0–0.2)

## 2019-10-15 LAB — IRON AND TIBC
Iron: 70 ug/dL (ref 28–170)
Saturation Ratios: 25 % (ref 10.4–31.8)
TIBC: 281 ug/dL (ref 250–450)
UIBC: 211 ug/dL

## 2019-10-15 LAB — FERRITIN: Ferritin: 93 ng/mL (ref 11–307)

## 2019-10-18 DIAGNOSIS — H02055 Trichiasis without entropian left lower eyelid: Secondary | ICD-10-CM | POA: Diagnosis not present

## 2019-10-22 DIAGNOSIS — J019 Acute sinusitis, unspecified: Secondary | ICD-10-CM | POA: Diagnosis not present

## 2019-10-22 DIAGNOSIS — J029 Acute pharyngitis, unspecified: Secondary | ICD-10-CM | POA: Diagnosis not present

## 2019-10-22 DIAGNOSIS — J069 Acute upper respiratory infection, unspecified: Secondary | ICD-10-CM | POA: Diagnosis not present

## 2019-10-22 DIAGNOSIS — Z20828 Contact with and (suspected) exposure to other viral communicable diseases: Secondary | ICD-10-CM | POA: Diagnosis not present

## 2019-10-22 DIAGNOSIS — I10 Essential (primary) hypertension: Secondary | ICD-10-CM | POA: Diagnosis not present

## 2019-10-26 ENCOUNTER — Ambulatory Visit: Payer: Medicare Other

## 2019-10-28 ENCOUNTER — Telehealth: Payer: Self-pay | Admitting: Family Medicine

## 2019-10-28 NOTE — Telephone Encounter (Signed)
Patient's daughter,Ann,called.  Patient has a virtual visit with Dr. Diona Browner tomorrow.  Ann won't be able to be with her mother tomorrow. She wanted to give background for the visit.  Patient was exposed to her great granddaughter who was sick last week with an ear infection and cold. Patient was outside Saturday night and it started raining and she got wet.  Patient went to [facility], [place] on Friday.  Patient was tested for flu, strep, and covid and they were all negative.  Patient was diagnosed with a sinus infection and was given Keflex.  Patient continues to have a runny nose and ear pain.  Patient is hard of hearing and her hearing aids aren't working.  Lelon Frohlich will try to ask her brother to be with patient during the visit.

## 2019-10-29 ENCOUNTER — Telehealth (INDEPENDENT_AMBULATORY_CARE_PROVIDER_SITE_OTHER): Payer: Medicare Other | Admitting: Family Medicine

## 2019-10-29 ENCOUNTER — Encounter: Payer: Self-pay | Admitting: Family Medicine

## 2019-10-29 ENCOUNTER — Other Ambulatory Visit: Payer: Self-pay

## 2019-10-29 VITALS — BP 131/66 | HR 61 | Temp 97.7°F | Wt 127.0 lb

## 2019-10-29 DIAGNOSIS — R0981 Nasal congestion: Secondary | ICD-10-CM | POA: Insufficient documentation

## 2019-10-29 NOTE — Patient Instructions (Addendum)
Start mucinex  DM twice daily. Flonase 2 spray per nostril daily.  Start nasal saline spray 2-3 times a day.

## 2019-10-29 NOTE — Progress Notes (Signed)
VIRTUAL VISIT Due to national recommendations of social distancing due to [place] 19, a virtual visit is felt to be most appropriate for this patient at this time.   I connected with the patient on 10/29/19 at 11:40 AM EDT by virtual telehealth platform and verified that I am speaking with the correct person using two identifiers.   I Interactive audio and video telecommunications were attempted between this provider and patient, however failed, due to patient having technical difficulties OR patient did not have access to video capability.  We continued and completed visit with audio only.   I discussed the limitations, risks, security and privacy concerns of performing an evaluation and management service by  virtual telehealth platform and the availability of in person appointments. I also discussed with the patient that there may be a patient responsible charge related to this service. The patient expressed understanding and agreed to proceed.  Patient location: Home Provider Location: [facility] Participants: Jenna Young and [place]   Chief Complaint  Patient presents with  . Sinus Problem    had neg covid , sinus pressure, congestion , some fever , started about 1 week ago     History of Present Illness: Sinus Problem The current episode started 1 to 4 weeks ago. The problem is unchanged. There has been no fever (low grade temp early on in illness, noe in last week). She is experiencing no pain. Associated symptoms include congestion and ear pain. Pertinent negatives include no chills, coughing, headaches, shortness of breath, sinus pressure, sneezing, sore throat or swollen glands. (Congestion in headache, in ears  occ pain in ears, minimal.  occ minor cough) Past treatments include antibiotics and acetaminophen. The treatment provided moderate relief.    Negative COVID PCR test negative.  Went to urgent care last week  Started on amoxicillin 8/13.  Taking for 7  days.  She is still feeling tired.   NOT vaccinated for COVID19  COVID 19 screen No recent travel or known exposure to [place] The patient denies respiratory symptoms of COVID 19 at this time.  The importance of social distancing was discussed today.   Review of Systems  Constitutional: Negative for chills.  HENT: Positive for congestion and ear pain. Negative for sinus pressure, sneezing and sore throat.   Respiratory: Negative for cough and shortness of breath.   Neurological: Negative for headaches.      Past Medical History:  Diagnosis Date  . Allergic rhinitis   . Anemia   . Anxiety   . GERD (gastroesophageal reflux disease)   . History of radiation therapy 04/29/16- 05/24/16   Left Breast 40.05 Gy in 15 fractions, Left Breast boost 10 Gy in 5 fractions.   . Hyperlipidemia   . Hypertension    treated by Dr Thurnell Garbe recently with syncope episode  . Osteopenia   . Osteoporosis   . Personal history of radiation therapy 2018    reports that she quit smoking about 51 years ago. She has never used smokeless tobacco. She reports that she does not drink alcohol and does not use drugs.   Current Outpatient Medications:  .  aspirin (ASPIRIN EC) 81 MG EC tablet, Take 81 mg by mouth daily. Swallow whole. , Disp: , Rfl:  .  atorvastatin (LIPITOR) 40 MG tablet, Take 1 tablet (40 mg total) by mouth daily at 6 PM., Disp: 90 tablet, Rfl: 0 .  carvedilol (COREG) 3.125 MG tablet, Take 1 tablet (3.125 mg total) by mouth 2 (two)  times daily., Disp: 180 tablet, Rfl: 3 .  Cholecalciferol (VITAMIN D) 2000 UNITS CAPS, Take 1 capsule by mouth daily.  , Disp: , Rfl:  .  clopidogrel (PLAVIX) 75 MG tablet, Take 1 tablet (75 mg total) by mouth daily with breakfast., Disp: 90 tablet, Rfl: 3 .  cyanocobalamin (,VITAMIN B-12,) 1000 MCG/ML injection, Inject 1,000 mcg into the muscle every 3 (three) months., Disp: , Rfl:  .  isosorbide mononitrate (IMDUR) 30 MG 24 hr tablet, Take 0.5 tablets (15 mg  total) by mouth daily., Disp: 45 tablet, Rfl: 3 .  losartan (COZAAR) 25 MG tablet, TAKE 1 TABLET(25 MG) BY MOUTH DAILY, Disp: 90 tablet, Rfl: 0 .  nitroGLYCERIN (NITROSTAT) 0.4 MG SL tablet, PLACE 1 TABLET UNDER TONGUE EVERY 5 MIN AS NEEDED FOR CHEST PAIN IF NO RELIEF IN15 MIN CALL 911 (MAX 3 TABS), Disp: 25 tablet, Rfl: 1 .  omeprazole (PRILOSEC) 20 MG capsule, Take 1 capsule (20 mg total) by mouth daily as needed., Disp: 90 capsule, Rfl: 3 .  terconazole (TERAZOL 7) 0.4 % vaginal cream, Apply to affected areas of vulva daily for itching as needed, Disp: 45 g, Rfl: 0   Observations/Objective: Blood pressure 131/66, pulse 61, temperature 97.7 F (36.5 C), weight 127 lb (57.6 kg).  Physical Exam  Physical Exam Constitutional:      General: The patient is not in acute distress. Pulmonary:     Effort: Pulmonary effort is normal. No respiratory distress.  Neurological:     Mental Status: The patient is alert and oriented to person, place, and time.  Psychiatric:        Mood and Affect: Mood normal.        Behavior: Behavior normal.   Assessment and Plan Nasal congestion Currently 3 more days on antibiotics for possible sinus infection. Complete this course although symptoms may be from viral URI.  Add mucinex DM BID , flonase and nasal saline .   Low threshold for inpatient exam if worsening opr any SOB starts.     Neg COVID19 PCR test.     I discussed the assessment and treatment plan with the patient. The patient was provided an opportunity to ask questions and all were answered. The patient agreed with the plan and demonstrated an understanding of the instructions.   The patient was advised to call back or seek an in-person evaluation if the symptoms worsen or if the condition fails to improve as anticipated.  Total visit time 10 minutes, > 50% spent counseling and cordinating patients care.     Jenna Lofts, MD

## 2019-10-29 NOTE — Assessment & Plan Note (Signed)
Currently 3 more days on antibiotics for possible sinus infection. Complete this course although symptoms may be from viral URI.  Add mucinex DM BID , flonase and nasal saline .   Low threshold for inpatient exam if worsening opr any SOB starts.     Neg COVID19 PCR test.

## 2019-11-08 DIAGNOSIS — H903 Sensorineural hearing loss, bilateral: Secondary | ICD-10-CM | POA: Diagnosis not present

## 2019-11-08 DIAGNOSIS — H6123 Impacted cerumen, bilateral: Secondary | ICD-10-CM | POA: Diagnosis not present

## 2019-11-10 ENCOUNTER — Ambulatory Visit (INDEPENDENT_AMBULATORY_CARE_PROVIDER_SITE_OTHER): Payer: Medicare Other

## 2019-11-10 ENCOUNTER — Other Ambulatory Visit: Payer: Self-pay

## 2019-11-10 DIAGNOSIS — E538 Deficiency of other specified B group vitamins: Secondary | ICD-10-CM | POA: Diagnosis not present

## 2019-11-10 MED ORDER — CYANOCOBALAMIN 1000 MCG/ML IJ SOLN
1000.0000 ug | Freq: Once | INTRAMUSCULAR | Status: AC
  Administered 2019-11-10: 1000 ug via INTRAMUSCULAR

## 2019-11-10 NOTE — Progress Notes (Addendum)
Per orders of Dr. Glori Bickers, injection of B12 given, into right bicep, by Loreen Freud. Patient tolerated injection well.

## 2019-12-11 ENCOUNTER — Other Ambulatory Visit: Payer: Self-pay

## 2019-12-11 ENCOUNTER — Ambulatory Visit (INDEPENDENT_AMBULATORY_CARE_PROVIDER_SITE_OTHER): Payer: Medicare Other

## 2019-12-11 DIAGNOSIS — Z23 Encounter for immunization: Secondary | ICD-10-CM

## 2019-12-13 ENCOUNTER — Other Ambulatory Visit: Payer: Self-pay | Admitting: Cardiovascular Disease

## 2019-12-14 DIAGNOSIS — H02055 Trichiasis without entropian left lower eyelid: Secondary | ICD-10-CM | POA: Diagnosis not present

## 2019-12-14 DIAGNOSIS — Z961 Presence of intraocular lens: Secondary | ICD-10-CM | POA: Diagnosis not present

## 2019-12-14 DIAGNOSIS — S0502XA Injury of conjunctiva and corneal abrasion without foreign body, left eye, initial encounter: Secondary | ICD-10-CM | POA: Diagnosis not present

## 2019-12-31 NOTE — Progress Notes (Signed)
Cardiology Office Note  Date:  01/03/2020   ID:  Jenna Young, DOB [birth year], MRN [id number]  PCP:  Jenna Greenspan, MD   Chief Complaint  Patient presents with  . Other    6 month follow up. Meds reviewed verbally with patient.     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 who follows up today for her hypertension, near syncope/syncope,  stable angina  Sinus infection, After seeing daughter covid neg  Myalgias on lipitor Legs weak Finished heart care  Sedentary, not walking No chest pain  NSTEMI 05/2019 cardiac catheterization showing occluded RCA with collaterals from left to right  No further episodes since that time Periodic diarrhea, not associated with syncope in April  Long hx of anemia, HGB 11 Iron infusions x2 Follows up with heme  EKG personally reviewed by myself on todays visit Shows normal sinus rhythm rate 70 bpm T wave abnormality V3 to V6, left axis deviation, IVCD  Other past medical history reviewed Syncope 2020  on Saturday, was in the garden, 11 AM Reports that she was carrying some pot plants outside, started to feel dizzy Made her way inside but was dizzy, finally sat down inside Sister presents with her today reports that she had syncope per the son who was there with her Had bowel  incontinence Did not hurt herself  06/13/2019, reports that she had episode of syncope Ate some toast, then went shopping Maybe had some tea when she was out on the road Came back home, family was there, immediately started cooking Was in the kitchen when she felt weird, saw black spots, had to sit down Became unresponsive on the couch Family later on the floor Could not find pulse Blood pressure 61/40s, pulse ? EMTs arrived, she came  too  Chronic arm weakness started in March 2020 Stop the statin on her own symptoms did not get better Arm discomfort when moving the garbage can, raising her arms for long periods.  They feel tired  Prior episode of syncope while at the beauty parlor Had hair done, got up to a when she felt lightheaded and passed out  She did not fall or sustain any injuries from this event.   When EMS arrived patient's blood glucose was 137 and her blood pressure was 74/43 for which she received 500 cc of fluid.  In the hospital, TNT 2.48  started on heparin, medical management No cardiac catheterization performed as numbers were trending down and she was asymptomatic Etiology of the events unclear whether this was vasovagal, arrhythmia,  Lab work did not appear to be indicative of dehydration  hospital Admission for NSTEMI , 02/10/2017 D/c 02/13/2017  Cath 1. Significant mid RCA disease with sequential, calcified 80 and 95% stenoses. Element of thrombus is likely also present. 2. 90% ostial stenosis involving small OM1 branch. 3. Mildly reduced left ventricular contraction with inferior hypokinesis (LVEF ~45%). 4. Mildly elevated left ventricular filling pressure. 5. Unsuccessful PCI to mid RCA due to inability to cross the mid RCA stenosis despite aggressive buddy wire support.  Recommendation : if she has unstable angina symptoms could considerPCI with atherectomy to mid RCA   Echo 26/3785 Systolic function was normal. The   estimated ejection fraction was in the range of 50% to 55%.   Hypokinesis  of the inferior myocardium.  diagnosis of DCIS, had a biopsy bx Completed XRT   previously on amlodipine but wanted to change secondary to leg edema Started on bystolic Blood pressure continue to run high Started on HCTZ every other day  could not afford Bystolic long term   was changed to clonidine.  Presented to the emergency room December 31 2015 with near syncope , dehydration  "Fell  asleep in the sun " while she was on a swing set, was difficult to arouse, was taken inside by family, had large bowel movement   in the emergency room heart rate 62 bpm , sodium down to 129, creatinine elevated Notes indicate she was only taking clonidine once a day   PMH:   has a past medical history of Allergic rhinitis, Anemia, Anxiety, GERD (gastroesophageal reflux disease), History of radiation therapy (04/29/16- 05/24/16), Hyperlipidemia, Hypertension, Osteopenia, Osteoporosis, and Personal history of radiation therapy (2018).  PSH:    Past Surgical History:  Procedure Laterality Date  . APPENDECTOMY    . BREAST BIOPSY Left 11/29/2008  . BREAST BIOPSY Left 01/26/2016   malignant  . BREAST EXCISIONAL BIOPSY Left 12/26/2008  . BREAST LUMPECTOMY Left 02/27/2016  . BREAST LUMPECTOMY Left 04/01/2016  . BREAST LUMPECTOMY WITH RADIOACTIVE SEED LOCALIZATION Left 02/27/2016   Procedure: LEFT BREAST LUMPECTOMY WITH RADIOACTIVE SEED LOCALIZATION;  Surgeon: Jenna Messing III, MD;  Location: [facility];  Service: General;  Laterality: Left;  . BREAST SURGERY  10/10   benign biopsy  negative  . CESAREAN SECTION    . CHOLECYSTECTOMY    . CORONARY STENT INTERVENTION N/A 02/11/2017   Procedure: CORONARY STENT INTERVENTION;  Surgeon: Nelva Bush, MD;  Location: [facility];  Service: Cardiovascular;  Laterality: N/A;  . FEMUR FRACTURE SURGERY Right 2006  . LEFT HEART CATH AND CORONARY ANGIOGRAPHY N/A 02/11/2017   Procedure: LEFT HEART CATH AND CORONARY ANGIOGRAPHY;  Surgeon: Nelva Bush, MD;  Location: [facility];  Service: Cardiovascular;  Laterality: N/A;  . LEFT HEART CATH AND CORONARY ANGIOGRAPHY N/A 05/21/2019   Procedure: LEFT HEART CATH AND CORONARY ANGIOGRAPHY;  Surgeon: Wellington Hampshire, MD;  Location: [facility];  Service: Cardiovascular;  Laterality: N/A;  . OVARIAN CYST SURGERY    . RE-EXCISION OF BREAST LUMPECTOMY Left 04/01/2016    Procedure: RE-EXCISION OF LEFT BREAST MEDIAL MARGIN;  Surgeon: Jenna Messing III, MD;  Location: [facility];  Service: General;  Laterality: Left;    Current Outpatient Medications  Medication Sig Dispense Refill  . aspirin (ASPIRIN EC) 81 MG EC tablet Take 81 mg by mouth daily. Swallow whole.     . carvedilol (COREG) 3.125 MG tablet Take 1 tablet (3.125 mg total) by mouth 2 (two) times daily. 180 tablet 3  . Cholecalciferol (VITAMIN D) 2000 UNITS CAPS Take 1 capsule by mouth daily.      . clopidogrel (PLAVIX) 75 MG tablet Take 1 tablet (75 mg total) by mouth daily with breakfast. 90 tablet 3  . cyanocobalamin (,VITAMIN B-12,) 1000 MCG/ML injection Inject 1,000 mcg into the muscle every 3 (three) months.    . isosorbide mononitrate (IMDUR) 30 MG 24 hr tablet Take 0.5 tablets (15 mg total) by mouth daily. 45 tablet 3  . losartan (COZAAR) 25 MG tablet TAKE 1 TABLET(25 MG) BY MOUTH DAILY 90 tablet 0  . nitroGLYCERIN (NITROSTAT) 0.4 MG SL tablet PLACE 1 TABLET UNDER TONGUE EVERY 5 MIN AS NEEDED FOR CHEST PAIN IF NO RELIEF  IN15 MIN CALL 911 (MAX 3 TABS) 25 tablet 1  . omeprazole (PRILOSEC) 20 MG capsule Take 1 capsule (20 mg total) by mouth daily as needed. 90 capsule 3  . terconazole (TERAZOL 7) 0.4 % vaginal cream Apply to affected areas of vulva daily for itching as needed 45 g 0  . rosuvastatin (CRESTOR) 10 MG tablet Take 1 tablet (10 mg total) by mouth daily. 90 tablet 3   No current facility-administered medications for this visit.     Allergies:   Buspirone hcl, Codeine, Hydrochlorothiazide, Iron, Lansoprazole, Minocycline hcl, Penicillins, Phenergan [promethazine hcl], Ramipril, Ranexa [ranolazine], and Sulfa antibiotics   Social History:  The patient  reports that she quit smoking about 51 years ago. She has never used smokeless tobacco. She reports that she does not drink alcohol and does not use drugs.   Family History:   family history includes Cancer in her maternal aunt;  Diabetes in her maternal aunt; Heart disease in her father; Hypertension in her mother; Macular degeneration in her sister.   Review of Systems  Constitutional: Positive for malaise/fatigue.  Eyes: Negative.   Respiratory: Negative.   Cardiovascular: Negative.   Gastrointestinal: Positive for constipation.  Genitourinary: Negative.   Musculoskeletal: Negative.        Bilateral arm pain worse on the right  Neurological: Negative.   Psychiatric/Behavioral: Negative.   All other systems reviewed and are negative.   PHYSICAL EXAM: VS:  BP 122/60 (BP Location: Left Arm, Patient Position: Sitting, Cuff Size: Normal)   Pulse 70   Ht 5' (1.524 m)   Wt 124 lb (56.2 kg)   SpO2 98%   BMI 24.22 kg/m  , BMI Body mass index is 24.22 kg/m.  Constitutional:  oriented to person, place, and time. No distress.  HENT:  Head: Grossly normal Eyes:  no discharge. No scleral icterus.  Neck: No JVD, no carotid bruits  Cardiovascular: Regular rate and rhythm, no murmurs appreciated Pulmonary/Chest: Clear to auscultation bilaterally, no wheezes or rails Abdominal: Soft.  no distension.  no tenderness.  Musculoskeletal: Normal range of motion Neurological:  normal muscle tone. Coordination normal. No atrophy Skin: Skin warm and dry Psychiatric: normal affect, pleasant  \ Recent Labs: 04/14/2019: TSH 2.25 05/21/2019: ALT 15; BUN 6; Creatinine, Ser 0.81; Magnesium 2.0; Potassium 4.7; Sodium 136 10/15/2019: Hemoglobin 11.0; Platelets 191    Lipid Panel Lab Results  Component Value Date   CHOL 200 04/14/2019   HDL 55.50 04/14/2019   LDLCALC 121 (H) 04/14/2019   TRIG 117.0 04/14/2019      Wt Readings from Last 3 Encounters:  01/03/20 124 lb (56.2 kg)  10/29/19 127 lb (57.6 kg)  10/15/19 130 lb 14.4 oz (59.4 kg)      ASSESSMENT AND PLAN:  Coronary artery disease with stable angina Recent catheterization with chronically occluded RCA collaterals from left to right Currently with no  symptoms of angina. No further workup at this time. Continue current medication regimen.  Syncope No further epsiodes Staying hydrated  Essential hypertension, benign -  Blood pressure is well controlled on today's visit. No changes made to the medications. Will need to watch, losing weight  Pure hypercholesterolemia took herself off Lipitor Pill was too big, myalgias Will start crestor 10  Ductal carcinoma in situ (DCIS) of left breast Treatment is complete, radiation to the left    Total encounter time more than 25 minutes  Greater than 50% was spent in counseling and coordination of care with the patient  Orders Placed This Encounter  Procedures  . EKG 12-Lead    Signed, Esmond Plants, M.D., Ph.D. 01/03/2020  [place], [place]

## 2020-01-03 ENCOUNTER — Other Ambulatory Visit: Payer: Self-pay

## 2020-01-03 ENCOUNTER — Encounter: Payer: Self-pay | Admitting: Cardiovascular Disease

## 2020-01-03 ENCOUNTER — Ambulatory Visit (INDEPENDENT_AMBULATORY_CARE_PROVIDER_SITE_OTHER): Payer: Medicare Other | Admitting: Cardiovascular Disease

## 2020-01-03 VITALS — BP 122/60 | HR 70 | Ht 60.0 in | Wt 124.0 lb

## 2020-01-03 DIAGNOSIS — I5022 Chronic systolic (congestive) heart failure: Secondary | ICD-10-CM | POA: Diagnosis not present

## 2020-01-03 DIAGNOSIS — T466X5A Adverse effect of antihyperlipidemic and antiarteriosclerotic drugs, initial encounter: Secondary | ICD-10-CM

## 2020-01-03 DIAGNOSIS — I25118 Atherosclerotic heart disease of native coronary artery with other forms of angina pectoris: Secondary | ICD-10-CM

## 2020-01-03 DIAGNOSIS — Z8679 Personal history of other diseases of the circulatory system: Secondary | ICD-10-CM | POA: Diagnosis not present

## 2020-01-03 DIAGNOSIS — D649 Anemia, unspecified: Secondary | ICD-10-CM

## 2020-01-03 DIAGNOSIS — E785 Hyperlipidemia, unspecified: Secondary | ICD-10-CM

## 2020-01-03 DIAGNOSIS — I10 Essential (primary) hypertension: Secondary | ICD-10-CM

## 2020-01-03 DIAGNOSIS — M791 Myalgia, unspecified site: Secondary | ICD-10-CM

## 2020-01-03 DIAGNOSIS — I2510 Atherosclerotic heart disease of native coronary artery without angina pectoris: Secondary | ICD-10-CM

## 2020-01-03 MED ORDER — ROSUVASTATIN CALCIUM 10 MG PO TABS
10.0000 mg | ORAL_TABLET | Freq: Every day | ORAL | 3 refills | Status: DC
Start: 2020-01-03 — End: 2020-12-25

## 2020-01-03 NOTE — Patient Instructions (Signed)
Medication Instructions:  Hold the atorvastatin Start crestor 10 mg once a day  If you need a refill on your cardiac medications before your next appointment, please call your pharmacy.    Lab work: No new labs needed   If you have labs (blood work) drawn today and your tests are completely normal, you will receive your results only by: Marland Kitchen MyChart Message (if you have MyChart) OR . A paper copy in the mail If you have any lab test that is abnormal or we need to change your treatment, we will call you to review the results.   Testing/Procedures: No new testing needed   Follow-Up: At [facility], you and your health needs are our priority.  As part of our continuing mission to provide you with exceptional heart care, we have created designated Provider Care Teams.  These Care Teams include your primary Cardiologist (physician) and Advanced Practice Providers (APPs -  Physician Assistants and Nurse Practitioners) who all work together to provide you with the care you need, when you need it.  . You will need a follow up appointment in 12 months  . Providers on your designated Care Team:   . Murray Hodgkins, NP . Christell Faith, PA-C . Marrianne Mood, PA-C  Any Other Special Instructions Will Be Listed Below (If Applicable).  COVID-19 Vaccine Information can be found at: [email or web address] For questions related to vaccine distribution or appointments, please email [email or web address] or call [phone].

## 2020-01-16 NOTE — Progress Notes (Signed)
[facility]  Telephone:([phone]) [phone] [phone]) [phone]  ID: Jenna Young OB: 25-Dec-1931  MR#: [id number]  XIP#:[id number]  Patient Care Team: Abner Greenspan, MD as PCP - General Rockey Situ Kathlene November, MD as PCP - Cardiology (Cardiology) Lloyd Huger, MD as Consulting Physician (Oncology)  CHIEF COMPLAINT: Iron deficiency anemia.  INTERVAL HISTORY: Patient returns to clinic today for repeat laboratory work, further evaluation, and consideration of additional IV Feraheme.  She continues to feel well and remains asymptomatic.  She does not complain of any weakness or fatigue today. She has no neurologic complaints.  She denies any recent fevers or illnesses. She has no chest pain, shortness of breath, cough, or hemoptysis.  She denies any nausea, vomiting, constipation, or diarrhea.  She has no melena or hematochezia.  She has no urinary complaints.  Patient feels at her baseline offers no specific complaints today.  REVIEW OF SYSTEMS:   Review of Systems  Constitutional: Negative.  Negative for fever, malaise/fatigue and weight loss.  Respiratory: Negative.  Negative for cough and shortness of breath.   Cardiovascular: Negative.  Negative for chest pain and leg swelling.  Gastrointestinal: Negative.  Negative for abdominal pain, blood in stool and melena.  Genitourinary: Negative.  Negative for dysuria and hematuria.  Musculoskeletal: Negative.  Negative for back pain and myalgias.  Skin: Negative.  Negative for rash.  Neurological: Negative.  Negative for dizziness, focal weakness, weakness and headaches.  Psychiatric/Behavioral: Negative.  The patient is not nervous/anxious.     As per HPI. Otherwise, a complete review of systems is negative.  PAST MEDICAL HISTORY: Past Medical History:  Diagnosis Date   Allergic rhinitis    Anemia    Anxiety    GERD (gastroesophageal reflux disease)    History of radiation therapy 04/29/16- 05/24/16   Left  Breast 40.05 Gy in 15 fractions, Left Breast boost 10 Gy in 5 fractions.    Hyperlipidemia    Hypertension    treated by Dr Thurnell Garbe recently with syncope episode   Osteopenia    Osteoporosis    Personal history of radiation therapy 2018    PAST SURGICAL HISTORY: Past Surgical History:  Procedure Laterality Date   APPENDECTOMY     BREAST BIOPSY Left 11/29/2008   BREAST BIOPSY Left 01/26/2016   malignant   BREAST EXCISIONAL BIOPSY Left 12/26/2008   BREAST LUMPECTOMY Left 02/27/2016   BREAST LUMPECTOMY Left 04/01/2016   BREAST LUMPECTOMY WITH RADIOACTIVE SEED LOCALIZATION Left 02/27/2016   Procedure: LEFT BREAST LUMPECTOMY WITH RADIOACTIVE SEED LOCALIZATION;  Surgeon: Autumn Messing III, MD;  Location: [facility];  Service: General;  Laterality: Left;   BREAST SURGERY  10/10   benign biopsy  negative   CESAREAN SECTION     CHOLECYSTECTOMY     CORONARY STENT INTERVENTION N/A 02/11/2017   Procedure: CORONARY STENT INTERVENTION;  Surgeon: Nelva Bush, MD;  Location: [facility];  Service: Cardiovascular;  Laterality: N/A;   FEMUR FRACTURE SURGERY Right 2006   LEFT HEART CATH AND CORONARY ANGIOGRAPHY N/A 02/11/2017   Procedure: LEFT HEART CATH AND CORONARY ANGIOGRAPHY;  Surgeon: Nelva Bush, MD;  Location: [facility];  Service: Cardiovascular;  Laterality: N/A;   LEFT HEART CATH AND CORONARY ANGIOGRAPHY N/A 05/21/2019   Procedure: LEFT HEART CATH AND CORONARY ANGIOGRAPHY;  Surgeon: Wellington Hampshire, MD;  Location: [facility];  Service: Cardiovascular;  Laterality: N/A;   OVARIAN CYST SURGERY     RE-EXCISION OF BREAST LUMPECTOMY Left 04/01/2016  Procedure: RE-EXCISION OF LEFT BREAST MEDIAL MARGIN;  Surgeon: Autumn Messing III, MD;  Location: [facility];  Service: General;  Laterality: Left;    FAMILY HISTORY: Family History  Problem Relation Age of Onset   Hypertension Mother    Heart disease Father     Macular degeneration Sister    Diabetes Maternal Aunt    Cancer Maternal Aunt        throat    ADVANCED DIRECTIVES (Y/N):  N  HEALTH MAINTENANCE: Social History   Tobacco Use   Smoking status: Former Smoker    Quit date: 03/11/1968    Years since quitting: 51.8   Smokeless tobacco: Never Used  Vaping Use   Vaping Use: Never used  Substance Use Topics   Alcohol use: No    Alcohol/week: 0.0 standard drinks   Drug use: No     Colonoscopy:  PAP:  Bone density:  Lipid panel:  Allergies  Allergen Reactions   Buspirone Hcl     REACTION: HEART PALPITATIONS   Codeine     REACTION: nausea and vomiting   Hydrochlorothiazide     REACTION: syncope-possibly from dehydration   Iron     Oral iron -per pt made her "pass out"    Lansoprazole     REACTION: abd pain   Minocycline Hcl    Penicillins     REACTION: mouth numbness Has patient had a PCN reaction causing immediate rash, facial/tongue/throat swelling, SOB or lightheadedness with hypotension: No Has patient had a PCN reaction causing severe rash involving mucus membranes or skin necrosis: No Has patient had a PCN reaction that required hospitalization: No Has patient had a PCN reaction occurring within the last 10 years: No If all of the above answers are "NO", then may proceed with Cephalosporin use.   Phenergan [Promethazine Hcl] Other (See Comments)    Body shakes, hallucinations   Ramipril     Cough    Ranexa [Ranolazine]     Constipation    Sulfa Antibiotics Nausea Only    Current Outpatient Medications  Medication Sig Dispense Refill   aspirin (ASPIRIN EC) 81 MG EC tablet Take 81 mg by mouth daily. Swallow whole.      Cholecalciferol (VITAMIN D) 2000 UNITS CAPS Take 1 capsule by mouth daily.       clopidogrel (PLAVIX) 75 MG tablet Take 1 tablet (75 mg total) by mouth daily with breakfast. 90 tablet 3   cyanocobalamin (,VITAMIN B-12,) 1000 MCG/ML injection Inject 1,000 mcg into the  muscle every 3 (three) months.     isosorbide mononitrate (IMDUR) 30 MG 24 hr tablet Take 0.5 tablets (15 mg total) by mouth daily. 45 tablet 3   losartan (COZAAR) 25 MG tablet TAKE 1 TABLET(25 MG) BY MOUTH DAILY 90 tablet 0   nitroGLYCERIN (NITROSTAT) 0.4 MG SL tablet PLACE 1 TABLET UNDER TONGUE EVERY 5 MIN AS NEEDED FOR CHEST PAIN IF NO RELIEF IN15 MIN CALL 911 (MAX 3 TABS) 25 tablet 1   omeprazole (PRILOSEC) 20 MG capsule Take 1 capsule (20 mg total) by mouth daily as needed. 90 capsule 3   rosuvastatin (CRESTOR) 10 MG tablet Take 1 tablet (10 mg total) by mouth daily. 90 tablet 3   terconazole (TERAZOL 7) 0.4 % vaginal cream Apply to affected areas of vulva daily for itching as needed 45 g 0   carvedilol (COREG) 3.125 MG tablet Take 1 tablet (3.125 mg total) by mouth 2 (two) times daily. 180 tablet 3   No  current facility-administered medications for this visit.    OBJECTIVE: Vitals:   01/18/20 1314  BP: (!) 159/84  Pulse: 70  Resp: 20  Temp: 98 F (36.7 C)  SpO2: 100%     Body mass index is 24.59 kg/m.    ECOG FS:0 - Asymptomatic  General: Well-developed, well-nourished, no acute distress. Eyes: Pink conjunctiva, anicteric sclera. HEENT: Normocephalic, moist mucous membranes. Lungs: No audible wheezing or coughing. Heart: Regular rate and rhythm. Abdomen: Soft, nontender, no obvious distention. Musculoskeletal: No edema, cyanosis, or clubbing. Neuro: Alert, answering all questions appropriately. Cranial nerves grossly intact. Skin: No rashes or petechiae noted. Psych: Normal affect.  LAB RESULTS:  Lab Results  Component Value Date   NA 136 05/21/2019   K 4.7 05/21/2019   CL 105 05/21/2019   CO2 23 05/21/2019   GLUCOSE 111 (H) 05/21/2019   BUN 6 (L) 05/21/2019   CREATININE 0.81 05/21/2019   CALCIUM 8.9 05/21/2019   PROT 8.8 (H) 05/21/2019   ALBUMIN 4.2 05/21/2019   AST 26 05/21/2019   ALT 15 05/21/2019   ALKPHOS 73 05/21/2019   BILITOT 0.8 05/21/2019    GFRNONAA >60 05/21/2019   GFRAA >60 05/21/2019    Lab Results  Component Value Date   WBC 6.8 01/18/2020   NEUTROABS 3.6 01/18/2020   HGB 11.9 (L) 01/18/2020   HCT 36.2 01/18/2020   MCV 85.2 01/18/2020   PLT 220 01/18/2020   Lab Results  Component Value Date   IRON 90 01/18/2020   TIBC 305 01/18/2020   IRONPCTSAT 30 01/18/2020   Lab Results  Component Value Date   FERRITIN 91 01/18/2020    STUDIES: No results found.  ASSESSMENT: Iron deficiency anemia.  PLAN:   1.  Iron deficiency anemia: Patient's hemoglobin has improved and is now nearly within normal limits at 11.9.  Iron stores continue to be within normal limits.  Previously, all of her other laboratory work was either negative or within normal limits.  She does not require additional IV Feraheme today.  Patient last received treatment on May 26, 2019.  Return to clinic in 4 months with repeat laboratory work, further evaluation, and continuation of treatment if needed.  If patient's hemoglobin and iron stores remain stable at that time, she could possibly be discharged from clinic. 2.  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  Final pathology was noninvasive therefore she did not require chemotherapy.  Tamoxifen also would not be of benefit given the ER/PR status of her tumor.  Her most recent mammogram on September 24, 2019 was reported as BI-RADS 2.  Repeat in July 2022. Patient has requested that her primary care physician continue to order to monitor her mammograms. 3.  B12 deficiency: Continue monthly B12 injections with primary care as needed. 4.  Cardiac disease: Patient had NSTEMI in March 2021 and underwent cardiac catheterization.  Continue follow-up with cardiology and primary care as scheduled.  I spent a total of 20 minutes reviewing chart data, face-to-face evaluation with the patient, counseling and coordination of care as  detailed above.    Patient expressed understanding and was in agreement with this plan. She also understands that She can call clinic at any time with any questions, concerns, or complaints.   Cancer Staging History of breast cancer Staging form: Breast, AJCC 7th Edition - Clinical stage from 01/26/2016: Stage 0 (Tis (DCIS), N0, M0) - Signed by Truitt Merle, MD on 02/26/2016 - Pathologic:  Stage 0 (Tis (DCIS), N0, cM0) - Unsigned   Lloyd Huger, MD   01/18/2020 3:25 PM

## 2020-01-17 ENCOUNTER — Encounter: Payer: Self-pay | Admitting: Oncology

## 2020-01-18 ENCOUNTER — Inpatient Hospital Stay (HOSPITAL_BASED_OUTPATIENT_CLINIC_OR_DEPARTMENT_OTHER): Payer: Medicare Other | Admitting: Oncology

## 2020-01-18 ENCOUNTER — Encounter: Payer: Self-pay | Admitting: Oncology

## 2020-01-18 ENCOUNTER — Inpatient Hospital Stay: Payer: Medicare Other

## 2020-01-18 ENCOUNTER — Inpatient Hospital Stay: Payer: Medicare Other | Attending: Oncology

## 2020-01-18 VITALS — BP 159/84 | HR 70 | Temp 98.0°F | Resp 20 | Wt 125.9 lb

## 2020-01-18 DIAGNOSIS — I2510 Atherosclerotic heart disease of native coronary artery without angina pectoris: Secondary | ICD-10-CM

## 2020-01-18 DIAGNOSIS — Z8249 Family history of ischemic heart disease and other diseases of the circulatory system: Secondary | ICD-10-CM | POA: Diagnosis not present

## 2020-01-18 DIAGNOSIS — Z86000 Personal history of in-situ neoplasm of breast: Secondary | ICD-10-CM | POA: Insufficient documentation

## 2020-01-18 DIAGNOSIS — Z87891 Personal history of nicotine dependence: Secondary | ICD-10-CM | POA: Diagnosis not present

## 2020-01-18 DIAGNOSIS — E538 Deficiency of other specified B group vitamins: Secondary | ICD-10-CM | POA: Diagnosis not present

## 2020-01-18 DIAGNOSIS — Z833 Family history of diabetes mellitus: Secondary | ICD-10-CM | POA: Insufficient documentation

## 2020-01-18 DIAGNOSIS — D509 Iron deficiency anemia, unspecified: Secondary | ICD-10-CM | POA: Diagnosis not present

## 2020-01-18 DIAGNOSIS — I252 Old myocardial infarction: Secondary | ICD-10-CM | POA: Insufficient documentation

## 2020-01-18 LAB — IRON AND TIBC
Iron: 90 ug/dL (ref 28–170)
Saturation Ratios: 30 % (ref 10.4–31.8)
TIBC: 305 ug/dL (ref 250–450)
UIBC: 215 ug/dL

## 2020-01-18 LAB — CBC WITH DIFFERENTIAL/PLATELET
Abs Immature Granulocytes: 0.02 10*3/uL (ref 0.00–0.07)
Basophils Absolute: 0 10*3/uL (ref 0.0–0.1)
Basophils Relative: 1 %
Eosinophils Absolute: 0.2 10*3/uL (ref 0.0–0.5)
Eosinophils Relative: 2 %
HCT: 36.2 % (ref 36.0–46.0)
Hemoglobin: 11.9 g/dL — ABNORMAL LOW (ref 12.0–15.0)
Immature Granulocytes: 0 %
Lymphocytes Relative: 36 %
Lymphs Abs: 2.4 10*3/uL (ref 0.7–4.0)
MCH: 28 pg (ref 26.0–34.0)
MCHC: 32.9 g/dL (ref 30.0–36.0)
MCV: 85.2 fL (ref 80.0–100.0)
Monocytes Absolute: 0.5 10*3/uL (ref 0.1–1.0)
Monocytes Relative: 8 %
Neutro Abs: 3.6 10*3/uL (ref 1.7–7.7)
Neutrophils Relative %: 53 %
Platelets: 220 10*3/uL (ref 150–400)
RBC: 4.25 MIL/uL (ref 3.87–5.11)
RDW: 16.4 % — ABNORMAL HIGH (ref 11.5–15.5)
WBC: 6.8 10*3/uL (ref 4.0–10.5)
nRBC: 0 % (ref 0.0–0.2)

## 2020-01-18 LAB — FERRITIN: Ferritin: 91 ng/mL (ref 11–307)

## 2020-02-02 ENCOUNTER — Telehealth: Payer: Self-pay | Admitting: Cardiovascular Disease

## 2020-02-02 NOTE — Telephone Encounter (Signed)
Pt aware that per recent October with Dr Rockey Situ appt pt was to be on carvedilol 3.125 mg bid

## 2020-02-02 NOTE — Telephone Encounter (Signed)
Pt c/o medication issue:  1. Name of Medication: carvedilol   2. How are you currently taking this medication (dosage and times per day)?  Patient has 2 bottles with 3.125 mg and 6.25 mg  3. Are you having a reaction (difficulty breathing--STAT)? no  4. What is your medication issue?  Patient has 2 bottles and is not sure which one to take please call.

## 2020-02-10 ENCOUNTER — Other Ambulatory Visit: Payer: Self-pay

## 2020-02-10 ENCOUNTER — Ambulatory Visit (INDEPENDENT_AMBULATORY_CARE_PROVIDER_SITE_OTHER): Payer: Medicare Other

## 2020-02-10 DIAGNOSIS — E538 Deficiency of other specified B group vitamins: Secondary | ICD-10-CM

## 2020-02-10 MED ORDER — CYANOCOBALAMIN 1000 MCG/ML IJ SOLN
1000.0000 ug | Freq: Once | INTRAMUSCULAR | Status: AC
  Administered 2020-02-10: 1000 ug via INTRAMUSCULAR

## 2020-02-10 NOTE — Progress Notes (Signed)
Per orders of Dr. Duncan, injection of vit B12 given by Fatimah Sundquist. Patient tolerated injection well.  

## 2020-02-11 ENCOUNTER — Other Ambulatory Visit: Payer: Self-pay | Admitting: Family Medicine

## 2020-02-11 NOTE — Telephone Encounter (Signed)
Looks like PCP filled Rx last but before that, pt's cardiologist filled med, will route to PCP for review, last OV was PCP was 05/28/19

## 2020-02-23 DIAGNOSIS — H02055 Trichiasis without entropian left lower eyelid: Secondary | ICD-10-CM | POA: Diagnosis not present

## 2020-02-23 DIAGNOSIS — Z961 Presence of intraocular lens: Secondary | ICD-10-CM | POA: Diagnosis not present

## 2020-02-23 DIAGNOSIS — S0502XA Injury of conjunctiva and corneal abrasion without foreign body, left eye, initial encounter: Secondary | ICD-10-CM | POA: Diagnosis not present

## 2020-03-12 ENCOUNTER — Other Ambulatory Visit: Payer: Self-pay | Admitting: Cardiovascular Disease

## 2020-03-23 ENCOUNTER — Other Ambulatory Visit: Payer: Self-pay

## 2020-03-23 MED ORDER — TERCONAZOLE 0.4 % VA CREA: TOPICAL_CREAM | VAGINAL | 0 refills | Status: DC

## 2020-03-23 NOTE — Telephone Encounter (Signed)
Pt last seen 05/28/19 and was given terconazole cream that helped a perineal itching. Pt said she is having the perineal itching again; pt said no vaginal discharge or itching; just itching on outside; pt has not taken abx but said she wears a pad all the time and wonders if that has caused itching. Pt does not want to come to office for appt and there are no available appts until next wk. Pt request terconazole cream to [facility] on [address]. Pt request cb after reviewed by Dr Glori Bickers.

## 2020-04-24 ENCOUNTER — Telehealth: Payer: Self-pay | Admitting: *Deleted

## 2020-04-24 NOTE — Telephone Encounter (Signed)
Benadryl should be ok but watch for sedation with that (do not fall)  Keep cool as well  Hold the losartan and to contact cardiology  If any severe rxn like tongue or throat swelling-go to the ER Watch for hives or rash and keep [place] posted

## 2020-04-24 NOTE — Telephone Encounter (Signed)
Patient called stating that she called several weeks ago because she was having itching in her private area. Patient stated that she now is having itching all over and feels that it may be caused by Losartan. Patient stated that she looked up on the interenet and determined it may be a side effect of Losartan. Patient stated that she does not have a rash or hives that she can see. Patient stated that she can feel a few bumps under her skin. Patient stated that Dr. Rockey Situ gave her the medication.  Advised patient that she should probably contact Dr. Rockey Situ since he is the one that prescribed her the Losartan. Patient wants to know if Dr. Glori Bickers thinks that it is okay for her to take Benadryl with all of the other medications that she takes.

## 2020-04-24 NOTE — Telephone Encounter (Signed)
Called and updated patient of Dr. Alba Cory recommendations. Patient verbalized understanding.

## 2020-04-25 ENCOUNTER — Telehealth: Payer: Self-pay | Admitting: Cardiovascular Disease

## 2020-04-25 MED ORDER — CARVEDILOL 3.125 MG PO TABS: 3.1250 mg | ORAL_TABLET | Freq: Two times a day (BID) | ORAL | 3 refills | Status: DC

## 2020-04-25 NOTE — Telephone Encounter (Signed)
Patient states she thinks she is allergic to Losartan. States she has been itching. Also, states she has stopped taking this medication last Friday, and asks if this is ok. Please call to discuss.

## 2020-04-25 NOTE — Telephone Encounter (Signed)
Was able to return pt's call, pt reports she has stop taking her Losartan on Friday 2/18 d/t itching. Reports vaginal itching then itching "got real bad to my head and everywhere". Advised pt she has been taking medication for a while and unsure her sudden reaction is caused by the Losartan. Pt reports itching has improved since she stop the medication on Friday. Pt also reached out to her PCP and they advised pt reach out to cardiology regarding medication intolerance. Recommended pt take benadryl for itching to see if any improvemtns, pt reports her PCP told her to try that but she hasn't tried any, advised to take 25 mg to see if that helps, pt also reports "vaginal cream has helped with itching down there too". Pt denies any new soap, lotions, foods, detergents, or have come into contact with anything that could cause itching. Pt addiment it is the Losartan, she "looked it up online", st "I was taken off it before" Advised pt I will add medication to her allergy list and remove from her current medications. Will route encounter to Dr. Rockey Situ, advise if another medication needs to be prescribe. Pt will monitor BP daily and continue her Coreg 3.125 mg. Otherwise all questions or concerns were address and no additional concerns at this time. Mrs. Quiroa will call back for anything further.

## 2020-04-30 NOTE — Telephone Encounter (Signed)
Less likely the losartan but she is welcome to stop it for now Monitor BP and call with numbers

## 2020-05-01 ENCOUNTER — Telehealth: Payer: Self-pay

## 2020-05-01 NOTE — Telephone Encounter (Signed)
Reached out to pt regarding Dr. Rockey Situ advised since pt took self off her losartan d/t itching. Do not think the losartan is the cause of itching, however, Dr. Rockey Situ stated okay for pt to stop taking if she wishes and to monitor BP and HR, advised to call in with readings and if BP is elevated or low to call for medication advised. Pt verbalized understanding, reports if "I find out it is not that medication I will try and restart it", advised pt that was okay, but to call back to let us to know that she restarted losartan. Pt verbalized understanding, grateful for call back, nothing further at this time.

## 2020-05-03 ENCOUNTER — Other Ambulatory Visit: Payer: Self-pay

## 2020-05-03 ENCOUNTER — Encounter: Payer: Self-pay | Admitting: Internal Medicine

## 2020-05-03 ENCOUNTER — Other Ambulatory Visit (INDEPENDENT_AMBULATORY_CARE_PROVIDER_SITE_OTHER): Payer: Medicare Other

## 2020-05-03 ENCOUNTER — Ambulatory Visit (INDEPENDENT_AMBULATORY_CARE_PROVIDER_SITE_OTHER): Payer: Medicare Other | Admitting: Internal Medicine

## 2020-05-03 ENCOUNTER — Telehealth (INDEPENDENT_AMBULATORY_CARE_PROVIDER_SITE_OTHER): Payer: Medicare Other | Admitting: Family Medicine

## 2020-05-03 DIAGNOSIS — E559 Vitamin D deficiency, unspecified: Secondary | ICD-10-CM | POA: Diagnosis not present

## 2020-05-03 DIAGNOSIS — L309 Dermatitis, unspecified: Secondary | ICD-10-CM | POA: Insufficient documentation

## 2020-05-03 DIAGNOSIS — I10 Essential (primary) hypertension: Secondary | ICD-10-CM | POA: Diagnosis not present

## 2020-05-03 DIAGNOSIS — E538 Deficiency of other specified B group vitamins: Secondary | ICD-10-CM

## 2020-05-03 DIAGNOSIS — M810 Age-related osteoporosis without current pathological fracture: Secondary | ICD-10-CM

## 2020-05-03 DIAGNOSIS — E782 Mixed hyperlipidemia: Secondary | ICD-10-CM | POA: Diagnosis not present

## 2020-05-03 DIAGNOSIS — D509 Iron deficiency anemia, unspecified: Secondary | ICD-10-CM

## 2020-05-03 DIAGNOSIS — R7309 Other abnormal glucose: Secondary | ICD-10-CM

## 2020-05-03 DIAGNOSIS — D649 Anemia, unspecified: Secondary | ICD-10-CM

## 2020-05-03 LAB — CBC WITH DIFFERENTIAL/PLATELET
Basophils Absolute: 0 10*3/uL (ref 0.0–0.1)
Basophils Relative: 0.5 % (ref 0.0–3.0)
Eosinophils Absolute: 0.3 10*3/uL (ref 0.0–0.7)
Eosinophils Relative: 4.2 % (ref 0.0–5.0)
HCT: 35.8 % — ABNORMAL LOW (ref 36.0–46.0)
Hemoglobin: 11.8 g/dL — ABNORMAL LOW (ref 12.0–15.0)
Lymphocytes Relative: 33.9 % (ref 12.0–46.0)
Lymphs Abs: 2.3 10*3/uL (ref 0.7–4.0)
MCHC: 32.9 g/dL (ref 30.0–36.0)
MCV: 84 fl (ref 78.0–100.0)
Monocytes Absolute: 0.6 10*3/uL (ref 0.1–1.0)
Monocytes Relative: 9.3 % (ref 3.0–12.0)
Neutro Abs: 3.5 10*3/uL (ref 1.4–7.7)
Neutrophils Relative %: 52.1 % (ref 43.0–77.0)
Platelets: 218 10*3/uL (ref 150.0–400.0)
RBC: 4.26 Mil/uL (ref 3.87–5.11)
RDW: 16.5 % — ABNORMAL HIGH (ref 11.5–15.5)
WBC: 6.7 10*3/uL (ref 4.0–10.5)

## 2020-05-03 LAB — COMPREHENSIVE METABOLIC PANEL
ALT: 9 U/L (ref 0–35)
AST: 13 U/L (ref 0–37)
Albumin: 4.1 g/dL (ref 3.5–5.2)
Alkaline Phosphatase: 71 U/L (ref 39–117)
BUN: 10 mg/dL (ref 6–23)
CO2: 28 mEq/L (ref 19–32)
Calcium: 9.5 mg/dL (ref 8.4–10.5)
Chloride: 99 mEq/L (ref 96–112)
Creatinine, Ser: 0.83 mg/dL (ref 0.40–1.20)
GFR: 63.88 mL/min (ref >60.00)
Glucose, Bld: 89 mg/dL (ref 70–99)
Potassium: 4 mEq/L (ref 3.5–5.1)
Sodium: 133 mEq/L — ABNORMAL LOW (ref 135–145)
Total Bilirubin: 0.5 mg/dL (ref 0.2–1.2)
Total Protein: 7.9 g/dL (ref 6.0–8.3)

## 2020-05-03 LAB — LIPID PANEL
Cholesterol: 184 mg/dL (ref 0–200)
HDL: 59.6 mg/dL (ref >39.00)
LDL Cholesterol: 95 mg/dL (ref 0–99)
NonHDL: 124.3
Total CHOL/HDL Ratio: 3
Triglycerides: 147 mg/dL (ref 0.0–149.0)
VLDL: 29.4 mg/dL (ref 0.0–40.0)

## 2020-05-03 LAB — VITAMIN B12: Vitamin B-12: 180 pg/mL — ABNORMAL LOW (ref 211–911)

## 2020-05-03 LAB — TSH: TSH: 2.1 u[IU]/mL (ref 0.35–4.50)

## 2020-05-03 LAB — HEMOGLOBIN A1C: Hgb A1c MFr Bld: 6 % (ref 4.6–6.5)

## 2020-05-03 LAB — VITAMIN D 25 HYDROXY (VIT D DEFICIENCY, FRACTURES): VITD: 52.48 ng/mL (ref 30.00–100.00)

## 2020-05-03 MED ORDER — TRIAMCINOLONE ACETONIDE 0.1 % EX CREA: 1.0000 "application " | TOPICAL_CREAM | Freq: Two times a day (BID) | CUTANEOUS | 1 refills | Status: DC | PRN

## 2020-05-03 MED ORDER — LOSARTAN POTASSIUM 100 MG PO TABS: 100.0000 mg | ORAL_TABLET | Freq: Every day | ORAL | 1 refills | Status: DC

## 2020-05-03 NOTE — Patient Instructions (Signed)
Please try the cream 2-3 times a day on your arm and vaginal area (use just a little bit there). Restart the losartan

## 2020-05-03 NOTE — Assessment & Plan Note (Signed)
BP Readings from Last 3 Encounters:  05/03/20 140/80  01/18/20 (!) 159/84  01/03/20 122/60   Losartan not the cause of rash Will restart

## 2020-05-03 NOTE — Assessment & Plan Note (Signed)
Irritative in vaginal area (not yeast)  Neuroderm---?bites on arm Will try TAC--sparing in vaginal area

## 2020-05-03 NOTE — Progress Notes (Signed)
Subjective:    Patient ID: Jenna Young, female    DOB: 08/23/1931, 87 y.o.   MRN: [id number]  HPI Here due to rash This visit occurred during the SARS-CoV-2 public health emergency.  Safety protocols were in place, including screening questions prior to the visit, additional usage of staff PPE, and extensive cleaning of exam room while observing appropriate contact time as indicated for disinfecting solutions.   Started with rash about 2 weeks ago This started in vaginal area  Sent rx for terconazole--no help  Some on left arm (extensor) ---very itchy This has been for a week  Stopped losartan---in case that was causing this Has been on it for a year  Current Outpatient Medications on File Prior to Visit  Medication Sig Dispense Refill  . aspirin 81 MG EC tablet Take 81 mg by mouth daily. Swallow whole.    . carvedilol (COREG) 3.125 MG tablet Take 1 tablet (3.125 mg total) by mouth 2 (two) times daily. 180 tablet 3  . Cholecalciferol (VITAMIN D) 2000 UNITS CAPS Take 1 capsule by mouth daily.    . clopidogrel (PLAVIX) 75 MG tablet TAKE 1 TABLET(75 MG) BY MOUTH DAILY WITH BREAKFAST 90 tablet 1  . cyanocobalamin (,VITAMIN B-12,) 1000 MCG/ML injection Inject 1,000 mcg into the muscle every 3 (three) months.    . isosorbide mononitrate (IMDUR) 30 MG 24 hr tablet Take 0.5 tablets (15 mg total) by mouth daily. 45 tablet 3  . nitroGLYCERIN (NITROSTAT) 0.4 MG SL tablet PLACE 1 TABLET UNDER TONGUE EVERY 5 MIN AS NEEDED FOR CHEST PAIN IF NO RELIEF IN15 MIN CALL 911 (MAX 3 TABS) 25 tablet 1  . omeprazole (PRILOSEC) 20 MG capsule Take 1 capsule (20 mg total) by mouth daily as needed. 90 capsule 3  . rosuvastatin (CRESTOR) 10 MG tablet Take 1 tablet (10 mg total) by mouth daily. 90 tablet 3  . terconazole (TERAZOL 7) 0.4 % vaginal cream Apply to affected areas of vulva daily for itching as needed 45 g 0   No current facility-administered medications on file prior to visit.    Allergies   Allergen Reactions  . Losartan Itching  . Buspirone Hcl     REACTION: HEART PALPITATIONS  . Codeine     REACTION: nausea and vomiting  . Hydrochlorothiazide     REACTION: syncope-possibly from dehydration  . Iron     Oral iron -per pt made her "pass out"   . Lansoprazole     REACTION: abd pain  . Minocycline Hcl   . Penicillins     REACTION: mouth numbness Has patient had a PCN reaction causing immediate rash, facial/tongue/throat swelling, SOB or lightheadedness with hypotension: No Has patient had a PCN reaction causing severe rash involving mucus membranes or skin necrosis: No Has patient had a PCN reaction that required hospitalization: No Has patient had a PCN reaction occurring within the last 10 years: No If all of the above answers are "NO", then may proceed with Cephalosporin use.  Marland Kitchen Phenergan [Promethazine Hcl] Other (See Comments)    Body shakes, hallucinations  . Ramipril     Cough   . Ranexa [Ranolazine]     Constipation   . Sulfa Antibiotics Nausea Only    Past Medical History:  Diagnosis Date  . Allergic rhinitis   . Anemia   . Anxiety   . GERD (gastroesophageal reflux disease)   . History of radiation therapy 04/29/16- 05/24/16   Left Breast 40.05 Gy in 15 fractions, Left  Breast boost 10 Gy in 5 fractions.   . Hyperlipidemia   . Hypertension    treated by Dr Thurnell Garbe recently with syncope episode  . Osteopenia   . Osteoporosis   . Personal history of radiation therapy 2018    Past Surgical History:  Procedure Laterality Date  . APPENDECTOMY    . BREAST BIOPSY Left 11/29/2008  . BREAST BIOPSY Left 01/26/2016   malignant  . BREAST EXCISIONAL BIOPSY Left 12/26/2008  . BREAST LUMPECTOMY Left 02/27/2016  . BREAST LUMPECTOMY Left 04/01/2016  . BREAST LUMPECTOMY WITH RADIOACTIVE SEED LOCALIZATION Left 02/27/2016   Procedure: LEFT BREAST LUMPECTOMY WITH RADIOACTIVE SEED LOCALIZATION;  Surgeon: Autumn Messing III, MD;  Location: [facility];  Service: General;  Laterality: Left;  . BREAST SURGERY  10/10   benign biopsy  negative  . CESAREAN SECTION    . CHOLECYSTECTOMY    . CORONARY STENT INTERVENTION N/A 02/11/2017   Procedure: CORONARY STENT INTERVENTION;  Surgeon: Nelva Bush, MD;  Location: [facility];  Service: Cardiovascular;  Laterality: N/A;  . FEMUR FRACTURE SURGERY Right 2006  . LEFT HEART CATH AND CORONARY ANGIOGRAPHY N/A 02/11/2017   Procedure: LEFT HEART CATH AND CORONARY ANGIOGRAPHY;  Surgeon: Nelva Bush, MD;  Location: [facility];  Service: Cardiovascular;  Laterality: N/A;  . LEFT HEART CATH AND CORONARY ANGIOGRAPHY N/A 05/21/2019   Procedure: LEFT HEART CATH AND CORONARY ANGIOGRAPHY;  Surgeon: Wellington Hampshire, MD;  Location: [facility];  Service: Cardiovascular;  Laterality: N/A;  . OVARIAN CYST SURGERY    . RE-EXCISION OF BREAST LUMPECTOMY Left 04/01/2016   Procedure: RE-EXCISION OF LEFT BREAST MEDIAL MARGIN;  Surgeon: Autumn Messing III, MD;  Location: [facility];  Service: General;  Laterality: Left;    Family History  Problem Relation Age of Onset  . Hypertension Mother   . Heart disease Father   . Macular degeneration Sister   . Diabetes Maternal Aunt   . Cancer Maternal Aunt        throat    Social History   Socioeconomic History  . Marital status: Widowed    Spouse name: Not on file  . Number of children: 1  . Years of education: Not on file  . Highest education level: Not on file  Occupational History    Employer: RETIRED  Tobacco Use  . Smoking status: Former Smoker    Quit date: 03/11/1968    Years since quitting: 52.1  . Smokeless tobacco: Never Used  Vaping Use  . Vaping Use: Never used  Substance and Sexual Activity  . Alcohol use: No    Alcohol/week: 0.0 standard drinks  . Drug use: No  . Sexual activity: Not Currently  Other Topics Concern  . Not on file  Social History Narrative  . Not on file   Social  Determinants of Health   Financial Resource Strain: Not on file  Food Insecurity: Not on file  Transportation Needs: Not on file  Physical Activity: Not on file  Stress: Not on file  Social Connections: Not on file  Intimate Partner Violence: Not on file   Review of Systems  No new skin products/detergents--did try soap from niece (helped the itching on arm some) No fever No animals but daughter did bring dogs with a visit a week ago    Objective:   Physical Exam Genitourinary:    Comments: Dry, red scaling on outer portion of labia majora No discharge or vaginal inflammation  Skin:    Comments: Papular rash on extensor distal left arm (neurodem vs bites)            Assessment & Plan:

## 2020-05-03 NOTE — Telephone Encounter (Signed)
Lab orders

## 2020-05-09 ENCOUNTER — Ambulatory Visit (INDEPENDENT_AMBULATORY_CARE_PROVIDER_SITE_OTHER): Payer: Medicare Other

## 2020-05-09 ENCOUNTER — Other Ambulatory Visit: Payer: Self-pay

## 2020-05-09 DIAGNOSIS — Z0000 Encounter for general adult medical examination without abnormal findings: Secondary | ICD-10-CM | POA: Diagnosis not present

## 2020-05-09 DIAGNOSIS — Z961 Presence of intraocular lens: Secondary | ICD-10-CM | POA: Diagnosis not present

## 2020-05-09 DIAGNOSIS — H02055 Trichiasis without entropian left lower eyelid: Secondary | ICD-10-CM | POA: Diagnosis not present

## 2020-05-09 DIAGNOSIS — H16142 Punctate keratitis, left eye: Secondary | ICD-10-CM | POA: Diagnosis not present

## 2020-05-09 NOTE — Progress Notes (Signed)
Subjective:   Jenna Young is a 87 y.o. female who presents for Medicare Annual (Subsequent) preventive examination.  Review of Systems: N/A      I connected with the patient today by telephone and verified that I am speaking with the correct person using two identifiers. Location patient: home Location nurse: work Persons participating in the telephone visit: patient, nurse.   I discussed the limitations, risks, security and privacy concerns of performing an evaluation and management service by telephone and the availability of in person appointments. I also discussed with the patient that there may be a patient responsible charge related to this service. The patient expressed understanding and verbally consented to this telephonic visit.        Cardiac Risk Factors include: advanced age (>40men, >35 women);hypertension;Other (see comment), Risk factor comments: hyperlipidemia     Objective:    Today's Vitals   There is no height or weight on file to calculate BMI.  Advanced Directives 05/09/2020 01/17/2020 07/16/2019 05/21/2019 05/21/2019 05/12/2019 04/14/2019  Does Patient Have a Medical Advance Directive? No No No No No No No  Type of Advance Directive - - - - - - -  Does patient want to make changes to medical advance directive? - - - - - - -  Would patient like information on creating a medical advance directive? No - Patient declined No - Patient declined - No - Patient declined No - Patient declined No - Patient declined No - Patient declined    Current Medications (verified) Outpatient Encounter Medications as of 05/09/2020  Medication Sig  . aspirin 81 MG EC tablet Take 81 mg by mouth daily. Swallow whole.  . carvedilol (COREG) 3.125 MG tablet Take 1 tablet (3.125 mg total) by mouth 2 (two) times daily.  . Cholecalciferol (VITAMIN D) 2000 UNITS CAPS Take 1 capsule by mouth daily.  . clopidogrel (PLAVIX) 75 MG tablet TAKE 1 TABLET(75 MG) BY MOUTH DAILY WITH BREAKFAST  .  cyanocobalamin (,VITAMIN B-12,) 1000 MCG/ML injection Inject 1,000 mcg into the muscle every 3 (three) months.  . isosorbide mononitrate (IMDUR) 30 MG 24 hr tablet Take 0.5 tablets (15 mg total) by mouth daily.  Marland Kitchen losartan (COZAAR) 100 MG tablet Take 1 tablet (100 mg total) by mouth daily.  . nitroGLYCERIN (NITROSTAT) 0.4 MG SL tablet PLACE 1 TABLET UNDER TONGUE EVERY 5 MIN AS NEEDED FOR CHEST PAIN IF NO RELIEF IN15 MIN CALL 911 (MAX 3 TABS)  . omeprazole (PRILOSEC) 20 MG capsule Take 1 capsule (20 mg total) by mouth daily as needed.  . rosuvastatin (CRESTOR) 10 MG tablet Take 1 tablet (10 mg total) by mouth daily.  Marland Kitchen triamcinolone (KENALOG) 0.1 % Apply 1 application topically 2 (two) times daily as needed.   No facility-administered encounter medications on file as of 05/09/2020.    Allergies (verified) Buspirone hcl, Codeine, Hydrochlorothiazide, Iron, Lansoprazole, Minocycline hcl, Penicillins, Phenergan [promethazine hcl], Ramipril, Ranexa [ranolazine], and Sulfa antibiotics   History: Past Medical History:  Diagnosis Date  . Allergic rhinitis   . Anemia   . Anxiety   . GERD (gastroesophageal reflux disease)   . History of radiation therapy 04/29/16- 05/24/16   Left Breast 40.05 Gy in 15 fractions, Left Breast boost 10 Gy in 5 fractions.   . Hyperlipidemia   . Hypertension    treated by Dr Thurnell Garbe recently with syncope episode  . Osteopenia   . Osteoporosis   . Personal history of radiation therapy 2018   Past Surgical History:  Procedure Laterality Date  . APPENDECTOMY    . BREAST BIOPSY Left 11/29/2008  . BREAST BIOPSY Left 01/26/2016   malignant  . BREAST EXCISIONAL BIOPSY Left 12/26/2008  . BREAST LUMPECTOMY Left 02/27/2016  . BREAST LUMPECTOMY Left 04/01/2016  . BREAST LUMPECTOMY WITH RADIOACTIVE SEED LOCALIZATION Left 02/27/2016   Procedure: LEFT BREAST LUMPECTOMY WITH RADIOACTIVE SEED LOCALIZATION;  Surgeon: Autumn Messing III, MD;  Location: [facility];   Service: General;  Laterality: Left;  . BREAST SURGERY  10/10   benign biopsy  negative  . CESAREAN SECTION    . CHOLECYSTECTOMY    . CORONARY STENT INTERVENTION N/A 02/11/2017   Procedure: CORONARY STENT INTERVENTION;  Surgeon: Nelva Bush, MD;  Location: [facility];  Service: Cardiovascular;  Laterality: N/A;  . FEMUR FRACTURE SURGERY Right 2006  . LEFT HEART CATH AND CORONARY ANGIOGRAPHY N/A 02/11/2017   Procedure: LEFT HEART CATH AND CORONARY ANGIOGRAPHY;  Surgeon: Nelva Bush, MD;  Location: [facility];  Service: Cardiovascular;  Laterality: N/A;  . LEFT HEART CATH AND CORONARY ANGIOGRAPHY N/A 05/21/2019   Procedure: LEFT HEART CATH AND CORONARY ANGIOGRAPHY;  Surgeon: Wellington Hampshire, MD;  Location: [facility];  Service: Cardiovascular;  Laterality: N/A;  . OVARIAN CYST SURGERY    . RE-EXCISION OF BREAST LUMPECTOMY Left 04/01/2016   Procedure: RE-EXCISION OF LEFT BREAST MEDIAL MARGIN;  Surgeon: Autumn Messing III, MD;  Location: [facility];  Service: General;  Laterality: Left;   Family History  Problem Relation Age of Onset  . Hypertension Mother   . Heart disease Father   . Macular degeneration Sister   . Diabetes Maternal Aunt   . Cancer Maternal Aunt        throat   Social History   Socioeconomic History  . Marital status: Widowed    Spouse name: Not on file  . Number of children: 1  . Years of education: Not on file  . Highest education level: Not on file  Occupational History    Employer: RETIRED  Tobacco Use  . Smoking status: Former Smoker    Quit date: 03/11/1968    Years since quitting: 52.1  . Smokeless tobacco: Never Used  Vaping Use  . Vaping Use: Never used  Substance and Sexual Activity  . Alcohol use: No    Alcohol/week: 0.0 standard drinks  . Drug use: No  . Sexual activity: Not Currently  Other Topics Concern  . Not on file  Social History Narrative  . Not on file   Social Determinants of Health    Financial Resource Strain: Low Risk   . Difficulty of Paying Living Expenses: Not hard at all  Food Insecurity: No Food Insecurity  . Worried About Charity fundraiser in the Last Year: Never true  . Ran Out of Food in the Last Year: Never true  Transportation Needs: No Transportation Needs  . Lack of Transportation (Medical): No  . Lack of Transportation (Non-Medical): No  Physical Activity: Inactive  . Days of Exercise per Week: 0 days  . Minutes of Exercise per Session: 0 min  Stress: No Stress Concern Present  . Feeling of Stress : Not at all  Social Connections: Not on file    Tobacco Counseling Counseling given: Not Answered   Clinical Intake:  Pre-visit preparation completed: Yes  Pain : No/denies pain     Nutritional Risks: None Diabetes: No  How often do you need to have someone help you  when you read instructions, pamphlets, or other written materials from your doctor or pharmacy?: 1 - Never What is the last grade level you completed in school?: 12th  Diabetic: No Nutrition Risk Assessment:  Has the patient had any N/V/D within the last 2 months?  No  Does the patient have any non-healing wounds?  No  Has the patient had any unintentional weight loss or weight gain?  No   Diabetes:  Is the patient diabetic?  No  If diabetic, was a CBG obtained today?  N/A Did the patient bring in their glucometer from home?  N/A How often do you monitor your CBG's? N/A.   Financial Strains and Diabetes Management:  Are you having any financial strains with the device, your supplies or your medication? N/A.  Does the patient want to be seen by Chronic Care Management for management of their diabetes?  N/A Would the patient like to be referred to a Nutritionist or for Diabetic Management?  N/A   Interpreter Needed?: No  Information entered by :: CJohnson, LPN   Activities of Daily Living In your present state of health, do you have any difficulty performing the  following activities: 05/09/2020 05/21/2019  Hearing? N N  Vision? N N  Difficulty concentrating or making decisions? N N  Walking or climbing stairs? N N  Dressing or bathing? N N  Doing errands, shopping? N N  Preparing Food and eating ? N -  Using the Toilet? N -  In the past six months, have you accidently leaked urine? Y -  Comment wears pads daily -  Do you have problems with loss of bowel control? N -  Managing your Medications? N -  Managing your Finances? N -  Housekeeping or managing your Housekeeping? N -  Some recent data might be hidden    Patient Care Team: Tower, [name], MD as PCP - General Rockey Situ, Kathlene November, MD as PCP - Cardiology (Cardiology) Lloyd Huger, MD as Consulting Physician (Oncology)  Indicate any recent Medical Services you may have received from other than [facility] providers in the past year (date may be approximate).     Assessment:   This is a routine wellness examination for Tyjae.  Hearing/Vision screen  Hearing Screening   125Hz  250Hz  500Hz  1000Hz  2000Hz  3000Hz  4000Hz  6000Hz  8000Hz   Right ear:           Left ear:           Vision Screening Comments: Patient gets annual eye exams   Dietary issues and exercise activities discussed: Current Exercise Habits: The patient does not participate in regular exercise at present, Exercise limited by: None identified  Goals    . Increase physical activity     Starting 04/08/2018, I will continue to exercise for 60 minutes twice weekly.     . Patient Stated     23/2021, I will maintain and continue medications as prescribed.     . Patient Stated     05/09/2020, I will maintain and continue medications as prescribed.       Depression Screen PHQ 2/9 Scores 05/09/2020 09/20/2019 06/21/2019 04/14/2019 04/08/2018 05/13/2017 03/31/2017  PHQ - 2 Score 0 0 0 0 0 0 0  PHQ- 9 Score 0 1 1 0 0 0 0    Fall Risk Fall Risk  05/09/2020 06/17/2019 04/14/2019 04/08/2018 03/31/2017  Falls in the past year? 0 1 0 0 No  Number  falls in past yr: 0 0 0 - -  Injury with Fall? 0 0 0 - -  Risk for fall due to : Medication side effect Medication side effect Medication side effect - -  Follow up Falls evaluation completed;Falls prevention discussed - Falls evaluation completed;Falls prevention discussed - -    FALL RISK PREVENTION PERTAINING TO THE HOME:  Any stairs in or around the home? Yes  If so, are there any without handrails? No  Home free of loose throw rugs in walkways, pet beds, electrical cords, etc? Yes  Adequate lighting in your home to reduce risk of falls? Yes   ASSISTIVE DEVICES UTILIZED TO PREVENT FALLS:  Life alert? Yes  Use of a cane, walker or w/c? No  Grab bars in the bathroom? Yes Shower chair or bench in shower? No  Elevated toilet seat or a handicapped toilet? No   TIMED UP AND GO:  Was the test performed? N/A telephone visit .    Cognitive Function: MMSE - Mini Mental State Exam 05/09/2020 04/14/2019 04/08/2018 03/31/2017 03/21/2016  Orientation to time 5 5 5 5 5   Orientation to Place 5 5 5 5 5   Registration 3 3 3 3 3   Attention/ Calculation 5 5 0 0 0  Recall 3 3 3 3 1   Recall-comments - - - - pt was unable to recall 2 of 3 words  Language- name 2 objects - - 0 0 0  Language- repeat 1 1 1 1 1   Language- follow 3 step command - - 3 3 3   Language- read & follow direction - - 0 0 0  Write a sentence - - 0 0 0  Copy design - - 0 0 0  Total score - - 20 20 18   Mini Cog  Mini-Cog screen was completed. Maximum score is 22. A value of 0 denotes this part of the MMSE was not completed or the patient failed this part of the Mini-Cog screening.       Immunizations Immunization History  Administered Date(s) Administered  . Fluad Quad(high Dose 65+) 12/29/2018, 12/11/2019  . Influenza Split 12/11/2010, 01/17/2012  . Influenza Whole 01/09/2006, 12/10/2006, 12/08/2007, 12/13/2009  . Influenza,inj,Quad PF,6+ Mos 12/09/2012, 12/15/2013, 12/22/2014, 12/21/2015, 12/24/2016, 01/14/2018  .  Pneumococcal Conjugate-13 02/23/2014  . Pneumococcal Polysaccharide-23 12/11/2010  . Td 01/10/2008    TDAP status: Due, Education has been provided regarding the importance of this vaccine. Advised may receive this vaccine at local pharmacy or Health Dept. Aware to provide a copy of the vaccination record if obtained from local pharmacy or Health Dept. Verbalized acceptance and understanding.  Flu Vaccine status: Up to date  Pneumococcal vaccine status: Up to date  Covid-19 vaccine status: Completed vaccines. will bring her card to her appointment so we can document dates in her chart  Qualifies for Shingles Vaccine? Yes   Zostavax completed No    Shingrix Completed?: No.    Education has been provided regarding the importance of this vaccine. Patient has been advised to call insurance company to determine out of pocket expense if they have not yet received this vaccine. Advised may also receive vaccine at local pharmacy or Health Dept. Verbalized acceptance and understanding.  Screening Tests Health Maintenance  Topic Date Due  . COVID-19 Vaccine (1) Never done  . DEXA SCAN  03/21/2025 (Originally 01/11/1999)  . TETANUS/TDAP  05/09/2025 (Originally 01/09/2018)  . MAMMOGRAM  09/23/2020  . INFLUENZA VACCINE  Completed  . PNA vac Low Risk Adult  Completed  . HPV VACCINES  Aged Out  . COLONOSCOPY (Pts  45-36yrs Insurance coverage will need to be confirmed)  Discontinued    Health Maintenance  Health Maintenance Due  Topic Date Due  . COVID-19 Vaccine (1) Never done    Colorectal cancer screening: No longer required.   Mammogram status: Completed 09/24/2019. Repeat every year  Bone Density status: declined  Lung Cancer Screening: (Low Dose CT Chest recommended if Age 58-80 years, 30 pack-year currently smoking OR have quit w/in 15years.) does not qualify.    Additional Screening:  Hepatitis C Screening: does not qualify; Completed N/A  Vision Screening: Recommended annual  ophthalmology exams for early detection of glaucoma and other disorders of the eye. Is the patient up to date with their annual eye exam?  Yes  Who is the provider or what is the name of the office in which the patient attends annual eye exams? Dr. Jomarie Longs  If pt is not established with a provider, would they like to be referred to a provider to establish care? No .   Dental Screening: Recommended annual dental exams for proper oral hygiene  Community Resource Referral / Chronic Care Management: CRR required this visit?  No   CCM required this visit?  No      Plan:     I have personally reviewed and noted the following in the patient's chart:   . Medical and social history . Use of alcohol, tobacco or illicit drugs  . Current medications and supplements . Functional ability and status . Nutritional status . Physical activity . Advanced directives . List of other physicians . Hospitalizations, surgeries, and ER visits in previous 12 months . Vitals . Screenings to include cognitive, depression, and falls . Referrals and appointments  In addition, I have reviewed and discussed with patient certain preventive protocols, quality metrics, and best practice recommendations. A written personalized care plan for preventive services as well as general preventive health recommendations were provided to patient.   Due to this being a telephonic visit, the after visit summary with patients personalized plan was offered to patient via office or my-chart. Patient preferred to pick up at office at next visit or via mychart.   Andrez Grime, LPN   06/13/[id number]

## 2020-05-09 NOTE — Progress Notes (Signed)
PCP notes:  Health Maintenance: Tdap- insurance Will bring her Covid card to appointment so dates can be added in her chart   Abnormal Screenings: none   Patient concerns: Rash on left arm onset 2 weeks ago Itching all over body    Nurse concerns: none   Next PCP appt.: 05/10/2020 @ 9:30 am

## 2020-05-09 NOTE — Patient Instructions (Signed)
[name] , Thank you for taking time to come for your Medicare Wellness Visit. I appreciate your ongoing commitment to your health goals. Please review the following plan we discussed and let me know if I can assist you in the future.   Screening recommendations/referrals: Colonoscopy: no longer required  Mammogram: Up to date, completed 09/24/2019, due 09/2020 Bone Density: declined  Recommended yearly ophthalmology/optometry visit for glaucoma screening and checkup Recommended yearly dental visit for hygiene and checkup  Vaccinations: Influenza vaccine: Up to date, completed 12/11/2019, due 10/2020 Pneumococcal vaccine: Completed series Tdap vaccine: decline-insurance  Shingles vaccine: due, check with your insurance regarding coverage if interested    Covid-19:Completed series, Please bring your card to appointment so dates can be documented in your chart  Advanced directives: Advance directive discussed with you today. Even though you declined this today please call our office should you change your mind and we can give you the proper paperwork for you to fill out.   Conditions/risks identified: hypertension, hyperlipidemia   Next appointment: Follow up in one year for your annual wellness visit    Preventive Care 67 Years and Older, Female Preventive care refers to lifestyle choices and visits with your health care provider that can promote health and wellness. What does preventive care include?  A yearly physical exam. This is also called an annual well check.  Dental exams once or twice a year.  Routine eye exams. Ask your health care provider how often you should have your eyes checked.  Personal lifestyle choices, including:  Daily care of your teeth and gums.  Regular physical activity.  Eating a healthy diet.  Avoiding tobacco and drug use.  Limiting alcohol use.  Practicing safe sex.  Taking low-dose aspirin every day.  Taking vitamin and mineral  supplements as recommended by your health care provider. What happens during an annual well check? The services and screenings done by your health care provider during your annual well check will depend on your age, overall health, lifestyle risk factors, and family history of disease. Counseling  Your health care provider may ask you questions about your:  Alcohol use.  Tobacco use.  Drug use.  Emotional well-being.  Home and relationship well-being.  Sexual activity.  Eating habits.  History of falls.  Memory and ability to understand (cognition).  Work and work Statistician.  Reproductive health. Screening  You may have the following tests or measurements:  Height, weight, and BMI.  Blood pressure.  Lipid and cholesterol levels. These may be checked every 5 years, or more frequently if you are over 23 years old.  Skin check.  Lung cancer screening. You may have this screening every year starting at age 38 if you have a 30-pack-year history of smoking and currently smoke or have quit within the past 15 years.  Fecal occult blood test (FOBT) of the stool. You may have this test every year starting at age 25.  Flexible sigmoidoscopy or colonoscopy. You may have a sigmoidoscopy every 5 years or a colonoscopy every 10 years starting at age 46.  Hepatitis C blood test.  Hepatitis B blood test.  Sexually transmitted disease (STD) testing.  Diabetes screening. This is done by checking your blood sugar (glucose) after you have not eaten for a while (fasting). You may have this done every 1-3 years.  Bone density scan. This is done to screen for osteoporosis. You may have this done starting at age 43.  Mammogram. This may be done every 1-2 years.  Talk to your health care provider about how often you should have regular mammograms. Talk with your health care provider about your test results, treatment options, and if necessary, the need for more tests. Vaccines  Your  health care provider may recommend certain vaccines, such as:  Influenza vaccine. This is recommended every year.  Tetanus, diphtheria, and acellular pertussis (Tdap, Td) vaccine. You may need a Td booster every 10 years.  Zoster vaccine. You may need this after age 20.  Pneumococcal 13-valent conjugate (PCV13) vaccine. One dose is recommended after age 32.  Pneumococcal polysaccharide (PPSV23) vaccine. One dose is recommended after age 13. Talk to your health care provider about which screenings and vaccines you need and how often you need them. This information is not intended to replace advice given to you by your health care provider. Make sure you discuss any questions you have with your health care provider. Document Released: 03/24/2015 Document Revised: 11/15/2015 Document Reviewed: 12/27/2014 Elsevier Interactive Patient Education  2017 [place] Prevention in the Home Falls can cause injuries. They can happen to people of all ages. There are many things you can do to make your home safe and to help prevent falls. What can I do on the outside of my home?  Regularly fix the edges of walkways and driveways and fix any cracks.  Remove anything that might make you trip as you walk through a door, such as a raised step or threshold.  Trim any bushes or trees on the path to your home.  Use bright outdoor lighting.  Clear any walking paths of anything that might make someone trip, such as rocks or tools.  Regularly check to see if handrails are loose or broken. Make sure that both sides of any steps have handrails.  Any raised decks and porches should have guardrails on the edges.  Have any leaves, snow, or ice cleared regularly.  Use sand or salt on walking paths during winter.  Clean up any spills in your garage right away. This includes oil or grease spills. What can I do in the bathroom?  Use night lights.  Install grab bars by the toilet and in the tub and  shower. Do not use towel bars as grab bars.  Use non-skid mats or decals in the tub or shower.  If you need to sit down in the shower, use a plastic, non-slip stool.  Keep the floor dry. Clean up any water that spills on the floor as soon as it happens.  Remove soap buildup in the tub or shower regularly.  Attach bath mats securely with double-sided non-slip rug tape.  Do not have throw rugs and other things on the floor that can make you trip. What can I do in the bedroom?  Use night lights.  Make sure that you have a light by your bed that is easy to reach.  Do not use any sheets or blankets that are too big for your bed. They should not hang down onto the floor.  Have a firm chair that has side arms. You can use this for support while you get dressed.  Do not have throw rugs and other things on the floor that can make you trip. What can I do in the kitchen?  Clean up any spills right away.  Avoid walking on wet floors.  Keep items that you use a lot in easy-to-reach places.  If you need to reach something above you, use a strong step stool  that has a grab bar.  Keep electrical cords out of the way.  Do not use floor polish or wax that makes floors slippery. If you must use wax, use non-skid floor wax.  Do not have throw rugs and other things on the floor that can make you trip. What can I do with my stairs?  Do not leave any items on the stairs.  Make sure that there are handrails on both sides of the stairs and use them. Fix handrails that are broken or loose. Make sure that handrails are as long as the stairways.  Check any carpeting to make sure that it is firmly attached to the stairs. Fix any carpet that is loose or worn.  Avoid having throw rugs at the top or bottom of the stairs. If you do have throw rugs, attach them to the floor with carpet tape.  Make sure that you have a light switch at the top of the stairs and the bottom of the stairs. If you do not  have them, ask someone to add them for you. What else can I do to help prevent falls?  Wear shoes that:  Do not have high heels.  Have rubber bottoms.  Are comfortable and fit you well.  Are closed at the toe. Do not wear sandals.  If you use a stepladder:  Make sure that it is fully opened. Do not climb a closed stepladder.  Make sure that both sides of the stepladder are locked into place.  Ask someone to hold it for you, if possible.  Clearly mark and make sure that you can see:  Any grab bars or handrails.  First and last steps.  Where the edge of each step is.  Use tools that help you move around (mobility aids) if they are needed. These include:  Canes.  Walkers.  Scooters.  Crutches.  Turn on the lights when you go into a dark area. Replace any light bulbs as soon as they burn out.  Set up your furniture so you have a clear path. Avoid moving your furniture around.  If any of your floors are uneven, fix them.  If there are any pets around you, be aware of where they are.  Review your medicines with your doctor. Some medicines can make you feel dizzy. This can increase your chance of falling. Ask your doctor what other things that you can do to help prevent falls. This information is not intended to replace advice given to you by your health care provider. Make sure you discuss any questions you have with your health care provider. Document Released: 12/22/2008 Document Revised: 08/03/2015 Document Reviewed: 04/01/2014 Elsevier Interactive Patient Education  2017 [employer].

## 2020-05-10 ENCOUNTER — Ambulatory Visit: Payer: Medicare Other

## 2020-05-10 ENCOUNTER — Encounter: Payer: Self-pay | Admitting: Family Medicine

## 2020-05-10 ENCOUNTER — Other Ambulatory Visit: Payer: Self-pay

## 2020-05-10 ENCOUNTER — Ambulatory Visit (INDEPENDENT_AMBULATORY_CARE_PROVIDER_SITE_OTHER): Payer: Medicare Other | Admitting: Family Medicine

## 2020-05-10 VITALS — BP 140/70 | HR 60 | Temp 97.1°F | Ht 61.0 in | Wt 129.4 lb

## 2020-05-10 DIAGNOSIS — E782 Mixed hyperlipidemia: Secondary | ICD-10-CM

## 2020-05-10 DIAGNOSIS — D509 Iron deficiency anemia, unspecified: Secondary | ICD-10-CM | POA: Diagnosis not present

## 2020-05-10 DIAGNOSIS — N898 Other specified noninflammatory disorders of vagina: Secondary | ICD-10-CM | POA: Diagnosis not present

## 2020-05-10 DIAGNOSIS — E538 Deficiency of other specified B group vitamins: Secondary | ICD-10-CM

## 2020-05-10 DIAGNOSIS — R7303 Prediabetes: Secondary | ICD-10-CM

## 2020-05-10 DIAGNOSIS — I25118 Atherosclerotic heart disease of native coronary artery with other forms of angina pectoris: Secondary | ICD-10-CM

## 2020-05-10 DIAGNOSIS — M810 Age-related osteoporosis without current pathological fracture: Secondary | ICD-10-CM

## 2020-05-10 DIAGNOSIS — E559 Vitamin D deficiency, unspecified: Secondary | ICD-10-CM | POA: Diagnosis not present

## 2020-05-10 DIAGNOSIS — K219 Gastro-esophageal reflux disease without esophagitis: Secondary | ICD-10-CM

## 2020-05-10 DIAGNOSIS — I10 Essential (primary) hypertension: Secondary | ICD-10-CM

## 2020-05-10 LAB — POCT WET PREP (WET MOUNT): Trichomonas Wet Prep HPF POC: ABSENT

## 2020-05-10 MED ORDER — TERCONAZOLE 0.4 % VA CREA: TOPICAL_CREAM | VAGINAL | 0 refills | Status: DC

## 2020-05-10 MED ORDER — CYANOCOBALAMIN 1000 MCG/ML IJ SOLN
1000.0000 ug | Freq: Once | INTRAMUSCULAR | Status: AC
Start: 2020-05-10 — End: 2020-05-10
  Administered 2020-05-10: 1000 ug via INTRAMUSCULAR

## 2020-05-10 MED ORDER — FLUCONAZOLE 150 MG PO TABS: 150.0000 mg | ORAL_TABLET | Freq: Once | ORAL | 0 refills | Status: AC

## 2020-05-10 NOTE — Assessment & Plan Note (Signed)
Plans cardiology visit next week  No clinical changes

## 2020-05-10 NOTE — Assessment & Plan Note (Signed)
Some confusion re: losartan dose  Taking 25 mg (? If past was 100)  Brief concern re: allergic but no imp in rash when she stopped it  bp not quite at goal BP: 140/70  Will address with cardiology at upcoming f/u

## 2020-05-10 NOTE — Assessment & Plan Note (Signed)
Lab Results  Component Value Date   VITAMINB12 180 (L) 05/03/2020   This is down  Plan to increase vit B12 shot freq to every month  Injection given today

## 2020-05-10 NOTE — Assessment & Plan Note (Signed)
Currently stable with iron infusions Hb 11.8

## 2020-05-10 NOTE — Patient Instructions (Addendum)
If you are interested in the new shingles vaccine (Shingrix) - call your local pharmacy to check on coverage and availability  If affordable, get on a wait list at your pharmacy to get the vaccine.  Let's change B12 shot to monthly (level is low)  B12 shot today   Make sure your urinary pads are not scented  Try the oral diflucan and the terazol cream for possible yeast vaginitis/dermatitis   Follow up with cardiology about blood pressure

## 2020-05-10 NOTE — Assessment & Plan Note (Signed)
Pt declines another dexa  Past hx of leg and spinal compression fracture Taking vit D and level is tx  No falls or new fractures Limited exercise

## 2020-05-10 NOTE — Assessment & Plan Note (Addendum)
Disc goals for lipids and reasons to control them Rev last labs with pt Rev low sat fat diet in detail LDL down to 95 Plan to continue crestor 10 mg daily

## 2020-05-10 NOTE — Assessment & Plan Note (Signed)
Lab Results  Component Value Date   HGBA1C 6.0 05/03/2020   Well controlled with diet  disc imp of low glycemic diet and wt loss to prevent DM2

## 2020-05-10 NOTE — Assessment & Plan Note (Signed)
Some hyphae on wet prep and skin is erythematous on and around vulva tx with diflucan orally and terazol cream topically  Update if not starting to improve in a week or if worsening

## 2020-05-10 NOTE — Assessment & Plan Note (Signed)
Vitamin D level is therapeutic with current supplementation Disc importance of this to bone and overall health Level of 52.4

## 2020-05-10 NOTE — Progress Notes (Signed)
Subjective:    Patient ID: Jenna Young, female    DOB: [birth year], 87 y.o.   MRN: [id number]  This visit occurred during the SARS-CoV-2 public health emergency.  Safety protocols were in place, including screening questions prior to the visit, additional usage of staff PPE, and extensive cleaning of exam room while observing appropriate contact time as indicated for disinfecting solutions.    HPI Pt presents for annual f/u of chronic medical problems   Wt Readings from Last 3 Encounters:  05/10/20 129 lb 6 oz (58.7 kg)  05/03/20 129 lb (58.5 kg)  01/18/20 125 lb 14.4 oz (57.1 kg)   24.45 kg/m   Not doing a lot lately  Feels fair   Still has an itching problem  Did not get any better when she stopped losartan  Vaginal itching is the worst area   Left arm -  ? Rash is under skin as well  No imp with tx of yeast or triamcinolone  No vaginal d/c or odor Wet prep today Results for orders placed or performed in visit on 05/10/20  POCT Wet Prep [facility])  Result Value Ref Range   Source Wet Prep POC vaginal    WBC, Wet Prep HPF POC few    Bacteria Wet Prep HPF POC Few Few   BACTERIA WET PREP MORPHOLOGY POC     Clue Cells Wet Prep HPF POC None None   Clue Cells Wet Prep [place] POC     Yeast Wet Prep HPF POC Few (A) None   KOH Wet Prep POC Few (A) None   Trichomonas Wet Prep HPF POC Absent Absent      Had amw 05/09/20 Tetanus shot def for ins cov  Zoster status -interested if covered/doubts it is  covid imms utd with booster   Mammogram 7/21 Self breast exam -no lumps  Past h/o breast cancer   Declines dexa  H/o OP Remote hx of leg fx and comp spinal fx Takes vit D, level is 52.4  Falls- none Fractures -none new  Exercise -limited   HTN in setting of CAD  bp is stable today  No cp or palpitations or headaches or edema  No side effects to medicines  BP Readings from Last 3 Encounters:  05/10/20 140/70  05/03/20 140/80  01/18/20 (!) 159/84     Takes coreg 3.125 mg bid Losartan -25 mg daily , there was a ? Of rash and itching from it  Really unsure what dose was (has appt with cardiology next week)   Isosorbide 15 mg daily   Pulse Readings from Last 3 Encounters:  05/10/20 60  05/03/20 64  01/18/20 70   GERD Takes omeprazole 20 mg prn   H/o anemia Lab Results  Component Value Date   WBC 6.7 05/03/2020   HGB 11.8 (L) 05/03/2020   HCT 35.8 (L) 05/03/2020   MCV 84.0 05/03/2020   PLT 218.0 05/03/2020   Sees hematology and IV iron infusions  Stable, better   Vit B12 def Lab Results  Component Value Date   VITAMINB12 180 (L) 05/03/2020   B12 shots every 3 mo  Last shot 02/10/20  H/o elevated glucose level Lab Results  Component Value Date   HGBA1C 6.0 05/03/2020  down from 6.2  Hyperlipidemia Takes crestor 10 mg daily  Lab Results  Component Value Date   CHOL 184 05/03/2020   CHOL 200 04/14/2019   CHOL 140 04/08/2018   Lab Results  Component Value Date  HDL 59.60 05/03/2020   HDL 55.50 04/14/2019   HDL 54.00 04/08/2018   Lab Results  Component Value Date   LDLCALC 95 05/03/2020   LDLCALC 121 (H) 04/14/2019   LDLCALC 66 04/08/2018   Lab Results  Component Value Date   TRIG 147.0 05/03/2020   TRIG 117.0 04/14/2019   TRIG 101.0 04/08/2018   Lab Results  Component Value Date   CHOLHDL 3 05/03/2020   CHOLHDL 4 04/14/2019   CHOLHDL 3 04/08/2018   Lab Results  Component Value Date   LDLDIRECT 116.6 01/27/2013   LDLDIRECT 114.5 12/04/2010   LDLDIRECT 117.1 04/23/2010   Eats a lot of chicken  Not a lot of fruits and vegetables  Does  Not like fruits  Eats green beans and pintos    Patient Active Problem List   Diagnosis Date Noted  . Dermatitis 05/03/2020  . Secondary hypertension   . Chest pain 05/21/2019  . Iron deficiency anemia 05/12/2019  . Prediabetes 04/15/2018  . Encounter for screening mammogram for breast cancer 04/01/2017  . H/O syncope 02/28/2017  . Coronary  artery disease of native artery of native heart with stable angina pectoris ([place]) 02/21/2017  . History of non-ST elevation myocardial infarction (NSTEMI) 02/10/2017  . History of breast cancer 02/26/2016  . Varicose vein of leg 10/09/2015  . Pedal edema 10/09/2015  . Heart murmur 10/09/2015  . History of spinal fracture 05/07/2015  . Dysthymia 09/20/2013  . Encounter for Medicare annual wellness exam 02/03/2013  . Vaginal itching 04/01/2012  . Anemia, unspecified 01/17/2012  . Vitamin D deficiency 08/19/2008  . ANXIETY 01/29/2008  . Essential hypertension, benign 09/25/2007  . B12 deficiency 09/24/2006  . Hyperlipidemia 09/24/2006  . Venous (peripheral) insufficiency 09/24/2006  . ALLERGIC RHINITIS 09/24/2006  . GERD 09/24/2006  . Osteoporosis 09/24/2006   Past Medical History:  Diagnosis Date  . Allergic rhinitis   . Anemia   . Anxiety   . GERD (gastroesophageal reflux disease)   . History of radiation therapy 04/29/16- 05/24/16   Left Breast 40.05 Gy in 15 fractions, Left Breast boost 10 Gy in 5 fractions.   . Hyperlipidemia   . Hypertension    treated by Dr Thurnell Garbe recently with syncope episode  . Osteopenia   . Osteoporosis   . Personal history of radiation therapy 2018   Past Surgical History:  Procedure Laterality Date  . APPENDECTOMY    . BREAST BIOPSY Left 11/29/2008  . BREAST BIOPSY Left 01/26/2016   malignant  . BREAST EXCISIONAL BIOPSY Left 12/26/2008  . BREAST LUMPECTOMY Left 02/27/2016  . BREAST LUMPECTOMY Left 04/01/2016  . BREAST LUMPECTOMY WITH RADIOACTIVE SEED LOCALIZATION Left 02/27/2016   Procedure: LEFT BREAST LUMPECTOMY WITH RADIOACTIVE SEED LOCALIZATION;  Surgeon: Autumn Messing III, MD;  Location: [facility];  Service: General;  Laterality: Left;  . BREAST SURGERY  10/10   benign biopsy  negative  . CESAREAN SECTION    . CHOLECYSTECTOMY    . CORONARY STENT INTERVENTION N/A 02/11/2017   Procedure: CORONARY STENT INTERVENTION;   Surgeon: Nelva Bush, MD;  Location: [place] [facility];  Service: Cardiovascular;  Laterality: N/A;  . FEMUR FRACTURE SURGERY Right 2006  . LEFT HEART CATH AND CORONARY ANGIOGRAPHY N/A 02/11/2017   Procedure: LEFT HEART CATH AND CORONARY ANGIOGRAPHY;  Surgeon: Nelva Bush, MD;  Location: [place] [facility];  Service: Cardiovascular;  Laterality: N/A;  . LEFT HEART CATH AND CORONARY ANGIOGRAPHY N/A 05/21/2019   Procedure: LEFT HEART CATH AND CORONARY ANGIOGRAPHY;  Surgeon: Wellington Hampshire, MD;  Location: [place] [facility];  Service: Cardiovascular;  Laterality: N/A;  . OVARIAN CYST SURGERY    . RE-EXCISION OF BREAST LUMPECTOMY Left 04/01/2016   Procedure: RE-EXCISION OF LEFT BREAST MEDIAL MARGIN;  Surgeon: Autumn Messing III, MD;  Location: [place];  Service: General;  Laterality: Left;   Social History   Tobacco Use  . Smoking status: Former Smoker    Quit date: 03/11/1968    Years since quitting: 52.2  . Smokeless tobacco: Never Used  Vaping Use  . Vaping Use: Never used  Substance Use Topics  . Alcohol use: No    Alcohol/week: 0.0 standard drinks  . Drug use: No   Family History  Problem Relation Age of Onset  . Hypertension Mother   . Heart disease Father   . Macular degeneration Sister   . Diabetes Maternal Aunt   . Cancer Maternal Aunt        throat   Allergies  Allergen Reactions  . Buspirone Hcl     REACTION: HEART PALPITATIONS  . Codeine     REACTION: nausea and vomiting  . Hydrochlorothiazide     REACTION: syncope-possibly from dehydration  . Iron     Oral iron -per pt made her "pass out"   . Lansoprazole     REACTION: abd pain  . Minocycline Hcl   . Penicillins     REACTION: mouth numbness Has patient had a PCN reaction causing immediate rash, facial/tongue/throat swelling, SOB or lightheadedness with hypotension: No Has patient had a PCN reaction causing severe rash involving mucus membranes or skin necrosis: No Has patient had  a PCN reaction that required hospitalization: No Has patient had a PCN reaction occurring within the last 10 years: No If all of the above answers are "NO", then may proceed with Cephalosporin use.  Marland Kitchen Phenergan [Promethazine Hcl] Other (See Comments)    Body shakes, hallucinations  . Ramipril     Cough   . Ranexa [Ranolazine]     Constipation   . Sulfa Antibiotics Nausea Only   Current Outpatient Medications on File Prior to Visit  Medication Sig Dispense Refill  . aspirin 81 MG EC tablet Take 81 mg by mouth daily. Swallow whole.    . carvedilol (COREG) 3.125 MG tablet Take 1 tablet (3.125 mg total) by mouth 2 (two) times daily. 180 tablet 3  . Cholecalciferol (VITAMIN D) 2000 UNITS CAPS Take 1 capsule by mouth daily.    . clopidogrel (PLAVIX) 75 MG tablet TAKE 1 TABLET(75 MG) BY MOUTH DAILY WITH BREAKFAST 90 tablet 1  . cyanocobalamin (,VITAMIN B-12,) 1000 MCG/ML injection Inject 1,000 mcg into the muscle every 30 (thirty) days.    . isosorbide mononitrate (IMDUR) 30 MG 24 hr tablet Take 0.5 tablets (15 mg total) by mouth daily. 45 tablet 3  . nitroGLYCERIN (NITROSTAT) 0.4 MG SL tablet PLACE 1 TABLET UNDER TONGUE EVERY 5 MIN AS NEEDED FOR CHEST PAIN IF NO RELIEF IN15 MIN CALL 911 (MAX 3 TABS) 25 tablet 1  . omeprazole (PRILOSEC) 20 MG capsule Take 1 capsule (20 mg total) by mouth daily as needed. 90 capsule 3  . rosuvastatin (CRESTOR) 10 MG tablet Take 1 tablet (10 mg total) by mouth daily. 90 tablet 3  . triamcinolone (KENALOG) 0.1 % Apply 1 application topically 2 (two) times daily as needed. 45 g 1  . losartan (COZAAR) 100 MG tablet Take 1 tablet (100 mg total) by mouth daily. 90 tablet  1   No current facility-administered medications on file prior to visit.    Review of Systems  Constitutional: Negative for activity change, appetite change, fatigue, fever and unexpected weight change.  HENT: Negative for congestion, ear pain, rhinorrhea, sinus pressure and sore throat.   Eyes:  Negative for pain, redness and visual disturbance.  Respiratory: Negative for cough, shortness of breath and wheezing.   Cardiovascular: Negative for chest pain and palpitations.  Gastrointestinal: Negative for abdominal pain, blood in stool, constipation and diarrhea.  Endocrine: Negative for polydipsia and polyuria.  Genitourinary: Negative for dysuria, frequency and urgency.  Musculoskeletal: Positive for arthralgias and back pain. Negative for myalgias.  Skin: Negative for pallor and rash.  Allergic/Immunologic: Negative for environmental allergies.  Neurological: Negative for dizziness, syncope and headaches.  Hematological: Negative for adenopathy. Does not bruise/bleed easily.  Psychiatric/Behavioral: Negative for decreased concentration and dysphoric mood. The patient is not nervous/anxious.        Objective:   Physical Exam Constitutional:      General: She is not in acute distress.    Appearance: Normal appearance. She is well-developed and normal weight. She is not ill-appearing or diaphoretic.  HENT:     Head: Normocephalic and atraumatic.     Right Ear: Tympanic membrane, ear canal and external ear normal.     Left Ear: Tympanic membrane, ear canal and external ear normal.     Nose: Nose normal. No congestion.     Mouth/Throat:     Mouth: Mucous membranes are moist.     Pharynx: Oropharynx is clear. No posterior oropharyngeal erythema.  Eyes:     General: No scleral icterus.    Extraocular Movements: Extraocular movements intact.     Conjunctiva/sclera: Conjunctivae normal.     Pupils: Pupils are equal, round, and reactive to light.  Neck:     Thyroid: No thyromegaly.     Vascular: No carotid bruit or JVD.  Cardiovascular:     Rate and Rhythm: Normal rate and regular rhythm.     Pulses: Normal pulses.     Heart sounds: Murmur heard.  No gallop.   Pulmonary:     Effort: Pulmonary effort is normal. No respiratory distress.     Breath sounds: Normal breath sounds.  No wheezing.     Comments: Good air exch Chest:     Chest wall: No tenderness.  Abdominal:     General: Bowel sounds are normal. There is no distension or abdominal bruit.     Palpations: Abdomen is soft. There is no mass.     Tenderness: There is no abdominal tenderness.     Hernia: No hernia is present.  Genitourinary:    Comments: Breast exam: No mass, nodules, thickening, tenderness, bulging, retraction, inflamation, nipple discharge or skin changes noted.  No axillary or clavicular LA.      No vaginal d/c or lesions Skin around vulva is erythematous  Wet prep obtained Mucosa appears mildly atrophic  Musculoskeletal:        General: No tenderness. Normal range of motion.     Cervical back: Normal range of motion and neck supple. No rigidity. No muscular tenderness.     Right lower leg: No edema.     Left lower leg: No edema.     Comments: Mild kyphosis   Lymphadenopathy:     Cervical: No cervical adenopathy.  Skin:    General: Skin is warm and dry.     Coloration: Skin is not pale.  Findings: No erythema or rash.     Comments: Solar lentigines diffusely Few scratches on L upper arm from scratching   Skin around vulva is brightly erythematous   Neurological:     Mental Status: She is alert. Mental status is at baseline.     Cranial Nerves: No cranial nerve deficit.     Motor: No abnormal muscle tone.     Coordination: Coordination normal.     Gait: Gait normal.     Deep Tendon Reflexes: Reflexes are normal and symmetric. Reflexes normal.  Psychiatric:        Mood and Affect: Mood normal.        Cognition and Memory: Cognition and memory normal.           Assessment & Plan:   Problem List Items Addressed This Visit      Cardiovascular and Mediastinum   Essential hypertension, benign - Primary    Some confusion re: losartan dose  Taking 25 mg (? If past was 100)  Brief concern re: allergic but no imp in rash when she stopped it  bp not quite at goal BP:  140/70  Will address with cardiology at upcoming f/u      Coronary artery disease of native artery of native heart with stable angina pectoris [facility])    Plans cardiology visit next week  No clinical changes         Digestive   GERD    Continues omeprazole 20 mg daily  Well controlled  Noted B12 def worse and will tx more agressively        Musculoskeletal and Integument   Osteoporosis    Pt declines another dexa  Past hx of leg and spinal compression fracture Taking vit D and level is tx  No falls or new fractures Limited exercise       Vaginal itching    Some hyphae on wet prep and skin is erythematous on and around vulva tx with diflucan orally and terazol cream topically  Update if not starting to improve in a week or if worsening        Relevant Orders   POCT Wet Prep [employer]) (Completed)     Other   B12 deficiency    Lab Results  Component Value Date   VITAMINB12 180 (L) 05/03/2020   This is down  Plan to increase vit B12 shot freq to every month  Injection given today      Vitamin D deficiency    Vitamin D level is therapeutic with current supplementation Disc importance of this to bone and overall health Level of 52.4      Hyperlipidemia    Disc goals for lipids and reasons to control them Rev last labs with pt Rev low sat fat diet in detail LDL down to 95 Plan to continue crestor 10 mg daily       Prediabetes    Lab Results  Component Value Date   HGBA1C 6.0 05/03/2020   Well controlled with diet  disc imp of low glycemic diet and wt loss to prevent DM2       Iron deficiency anemia    Currently stable with iron infusions Hb 11.8

## 2020-05-10 NOTE — Assessment & Plan Note (Signed)
Continues omeprazole 20 mg daily  Well controlled  Noted B12 def worse and will tx more agressively

## 2020-05-15 ENCOUNTER — Telehealth: Payer: Self-pay | Admitting: *Deleted

## 2020-05-15 MED ORDER — CLOBETASOL PROPIONATE 0.05 % EX CREA: 1.0000 "application " | TOPICAL_CREAM | Freq: Every day | CUTANEOUS | 0 refills | Status: DC | PRN

## 2020-05-15 NOTE — Telephone Encounter (Signed)
I sent in some clobetasol cream to try instead of the terazol  Once daily as needed Use a very small amount (it is a strong steroid cream and can thin the skin if over used)   Let me know in a week if it helps and we can make a plan re: what to do next (this treatment is short term)

## 2020-05-15 NOTE — Telephone Encounter (Signed)
Patient left a voicemail stating that she was told to call back if she was not doing better after her visit last week. Tired to call patient back and got her voicemail. Left a message for patient to call the office back.

## 2020-05-15 NOTE — Telephone Encounter (Signed)
Pt notified Rx sent to pharmacy and advise of Dr. Marliss Coots instructions. Pt will update [place] in a week on how the new med is working

## 2020-05-15 NOTE — Telephone Encounter (Signed)
I spoke with pt; pt said the perineal itching is very bad at night time. During the day the itching is not noticed as much. Pt said the itching wakes her up at night.pt took difllucan on 05/10/20 and itching subsided somewhat but never went a way. Terconazole cream has not helped at all. No fever and no vaginal discharge. Pt wants to know what else can do. [facility] [address]. Pt request cb.

## 2020-05-20 NOTE — Progress Notes (Signed)
[name]  [phone]) [phone] Fax:(336) [phone]  ID: Jenna Young OB: 06/06/1931  MR#: [id number]  QXI#:[id number]  Patient Care Team: Abner Greenspan, MD as PCP - General Rockey Situ Kathlene November, MD as PCP - Cardiology (Cardiology) Lloyd Huger, MD as Consulting Physician (Oncology)  CHIEF COMPLAINT: Iron deficiency anemia.  INTERVAL HISTORY: Patient returns to clinic today for repeat laboratory work, further evaluation, and consideration of additional IV Feraheme.  She continues to feel well and remains asymptomatic.  She denies any weakness and fatigue.  She remains active. She has no neurologic complaints.  She denies any recent fevers or illnesses. She has no chest pain, shortness of breath, cough, or hemoptysis.  She denies any nausea, vomiting, constipation, or diarrhea.  She has no melena or hematochezia.  She has no urinary complaints.  Patient feels at her baseline and offers no specific complaints today.  REVIEW OF SYSTEMS:   Review of Systems  Constitutional: Negative.  Negative for fever, malaise/fatigue and weight loss.  Respiratory: Negative.  Negative for cough and shortness of breath.   Cardiovascular: Negative.  Negative for chest pain and leg swelling.  Gastrointestinal: Negative.  Negative for abdominal pain, blood in stool and melena.  Genitourinary: Negative.  Negative for dysuria and hematuria.  Musculoskeletal: Negative.  Negative for back pain and myalgias.  Skin: Negative.  Negative for rash.  Neurological: Negative.  Negative for dizziness, focal weakness, weakness and headaches.  Psychiatric/Behavioral: Negative.  The patient is not nervous/anxious.     As per HPI. Otherwise, a complete review of systems is negative.  PAST MEDICAL HISTORY: Past Medical History:  Diagnosis Date  . Allergic rhinitis   . Anemia   . Anxiety   . GERD (gastroesophageal reflux disease)   . History of radiation therapy 04/29/16- 05/24/16   Left  Breast 40.05 Gy in 15 fractions, Left Breast boost 10 Gy in 5 fractions.   . Hyperlipidemia   . Hypertension    treated by Dr Thurnell Garbe recently with syncope episode  . Osteopenia   . Osteoporosis   . Personal history of radiation therapy 2018    PAST SURGICAL HISTORY: Past Surgical History:  Procedure Laterality Date  . APPENDECTOMY    . BREAST BIOPSY Left 11/29/2008  . BREAST BIOPSY Left 01/26/2016   malignant  . BREAST EXCISIONAL BIOPSY Left 12/26/2008  . BREAST LUMPECTOMY Left 02/27/2016  . BREAST LUMPECTOMY Left 04/01/2016  . BREAST LUMPECTOMY WITH RADIOACTIVE SEED LOCALIZATION Left 02/27/2016   Procedure: LEFT BREAST LUMPECTOMY WITH RADIOACTIVE SEED LOCALIZATION;  Surgeon: Autumn Messing III, MD;  Location: [facility];  Service: General;  Laterality: Left;  . BREAST SURGERY  10/10   benign biopsy  negative  . CESAREAN SECTION    . CHOLECYSTECTOMY    . CORONARY STENT INTERVENTION N/A 02/11/2017   Procedure: CORONARY STENT INTERVENTION;  Surgeon: Nelva Bush, MD;  Location: [facility];  Service: Cardiovascular;  Laterality: N/A;  . FEMUR FRACTURE SURGERY Right 2006  . LEFT HEART CATH AND CORONARY ANGIOGRAPHY N/A 02/11/2017   Procedure: LEFT HEART CATH AND CORONARY ANGIOGRAPHY;  Surgeon: Nelva Bush, MD;  Location: [facility];  Service: Cardiovascular;  Laterality: N/A;  . LEFT HEART CATH AND CORONARY ANGIOGRAPHY N/A 05/21/2019   Procedure: LEFT HEART CATH AND CORONARY ANGIOGRAPHY;  Surgeon: Wellington Hampshire, MD;  Location: [facility];  Service: Cardiovascular;  Laterality: N/A;  . OVARIAN CYST SURGERY    . RE-EXCISION OF BREAST LUMPECTOMY Left  04/01/2016   Procedure: RE-EXCISION OF LEFT BREAST MEDIAL MARGIN;  Surgeon: Autumn Messing III, MD;  Location: [facility];  Service: General;  Laterality: Left;    FAMILY HISTORY: Family History  Problem Relation Age of Onset  . Hypertension Mother   . Heart disease Father    . Macular degeneration Sister   . Diabetes Maternal Aunt   . Cancer Maternal Aunt        throat    ADVANCED DIRECTIVES (Y/N):  N  HEALTH MAINTENANCE: Social History   Tobacco Use  . Smoking status: Former Smoker    Quit date: 03/11/1968    Years since quitting: 52.2  . Smokeless tobacco: Never Used  Vaping Use  . Vaping Use: Never used  Substance Use Topics  . Alcohol use: No    Alcohol/week: 0.0 standard drinks  . Drug use: No     Colonoscopy:  PAP:  Bone density:  Lipid panel:  Allergies  Allergen Reactions  . Buspirone Hcl     REACTION: HEART PALPITATIONS  . Codeine     REACTION: nausea and vomiting  . Hydrochlorothiazide     REACTION: syncope-possibly from dehydration  . Iron     Oral iron -per pt made her "pass out"   . Lansoprazole     REACTION: abd pain  . Minocycline Hcl   . Penicillins     REACTION: mouth numbness Has patient had a PCN reaction causing immediate rash, facial/tongue/throat swelling, SOB or lightheadedness with hypotension: No Has patient had a PCN reaction causing severe rash involving mucus membranes or skin necrosis: No Has patient had a PCN reaction that required hospitalization: No Has patient had a PCN reaction occurring within the last 10 years: No If all of the above answers are "NO", then may proceed with Cephalosporin use.  Marland Kitchen Phenergan [Promethazine Hcl] Other (See Comments)    Body shakes, hallucinations  . Ramipril     Cough   . Ranexa [Ranolazine]     Constipation   . Sulfa Antibiotics Nausea Only    Current Outpatient Medications  Medication Sig Dispense Refill  . aspirin 81 MG EC tablet Take 81 mg by mouth daily. Swallow whole.    . carvedilol (COREG) 3.125 MG tablet Take 1 tablet (3.125 mg total) by mouth 2 (two) times daily. 180 tablet 3  . Cholecalciferol (VITAMIN D) 2000 UNITS CAPS Take 1 capsule by mouth daily.    . clobetasol cream (TEMOVATE) 7.16 % Apply 1 application topically daily as needed. To itchy  areas 30 g 0  . clopidogrel (PLAVIX) 75 MG tablet TAKE 1 TABLET(75 MG) BY MOUTH DAILY WITH BREAKFAST 90 tablet 1  . cyanocobalamin (,VITAMIN B-12,) 1000 MCG/ML injection Inject 1,000 mcg into the muscle every 30 (thirty) days.    . isosorbide mononitrate (IMDUR) 30 MG 24 hr tablet Take 0.5 tablets (15 mg total) by mouth daily. 45 tablet 3  . losartan (COZAAR) 100 MG tablet Take 1 tablet (100 mg total) by mouth daily. 90 tablet 1  . nitroGLYCERIN (NITROSTAT) 0.4 MG SL tablet PLACE 1 TABLET UNDER TONGUE EVERY 5 MIN AS NEEDED FOR CHEST PAIN IF NO RELIEF IN15 MIN CALL 911 (MAX 3 TABS) 25 tablet 1  . omeprazole (PRILOSEC) 20 MG capsule Take 1 capsule (20 mg total) by mouth daily as needed. 90 capsule 3  . rosuvastatin (CRESTOR) 10 MG tablet Take 1 tablet (10 mg total) by mouth daily. 90 tablet 3  . triamcinolone (KENALOG) 0.1 % Apply 1  application topically 2 (two) times daily as needed. 45 g 1   No current facility-administered medications for this visit.    OBJECTIVE: Vitals:   05/23/20 1315  BP: (!) 148/67  Pulse: 68  Resp: 18  Temp: 97.6 F (36.4 C)     Body mass index is 24.9 kg/m.    ECOG FS:0 - Asymptomatic  General: Well-developed, well-nourished, no acute distress. Eyes: Pink conjunctiva, anicteric sclera. HEENT: Normocephalic, moist mucous membranes. Lungs: No audible wheezing or coughing. Heart: Regular rate and rhythm. Abdomen: Soft, nontender, no obvious distention. Musculoskeletal: No edema, cyanosis, or clubbing. Neuro: Alert, answering all questions appropriately. Cranial nerves grossly intact. Skin: No rashes or petechiae noted. Psych: Normal affect.  LAB RESULTS:  Lab Results  Component Value Date   NA 133 (L) 05/03/2020   K 4.0 05/03/2020   CL 99 05/03/2020   CO2 28 05/03/2020   GLUCOSE 89 05/03/2020   BUN 10 05/03/2020   CREATININE 0.83 05/03/2020   CALCIUM 9.5 05/03/2020   PROT 7.9 05/03/2020   ALBUMIN 4.1 05/03/2020   AST 13 05/03/2020   ALT 9  05/03/2020   ALKPHOS 71 05/03/2020   BILITOT 0.5 05/03/2020   GFRNONAA >60 05/21/2019   GFRAA >60 05/21/2019    Lab Results  Component Value Date   WBC 7.3 05/23/2020   NEUTROABS 3.9 05/23/2020   HGB 11.7 (L) 05/23/2020   HCT 35.8 (L) 05/23/2020   MCV 84.6 05/23/2020   PLT 249 05/23/2020   Lab Results  Component Value Date   IRON 90 01/17/2020   TIBC 305 01/28/2020   IRONPCTSAT 30 02/05/2020   Lab Results  Component Value Date   FERRITIN 91 02/06/2020    STUDIES: No results found.  ASSESSMENT: Iron deficiency anemia.  PLAN:   1.  Iron deficiency anemia: Patient's hemoglobin is stable and remains essentially within normal limits at 11.7.  Previously, iron stores were also within normal limits.  Today's result is pending.  All of her other laboratory work was either negative or within normal limits.  She does not require additional IV Feraheme today.  Patient last received treatment on May 26, 2019.  After lengthy discussion with the patient, is agreed upon that no further follow-up is necessary.  Please refer patient back if there are any questions or concerns. 2.  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  Final pathology was noninvasive therefore she did not require chemotherapy.  Tamoxifen also would not be of benefit given the ER/PR status of her tumor.  Her most recent mammogram on September 24, 2019 was reported as BI-RADS 2.  Repeat in July 2022. Patient has requested that her primary care physician continue to order to monitor her mammograms. 3.  B12 deficiency: Continue monthly B12 injections with primary care as needed. 4.  Cardiac disease: Patient had NSTEMI in March 2021 and underwent cardiac catheterization.  Continue follow-up with cardiology and primary care as scheduled.  I spent a total of 20 minutes reviewing chart data, face-to-face evaluation with the patient, counseling and  coordination of care as detailed above.   Patient expressed understanding and was in agreement with this plan. She also understands that She can call clinic at any time with any questions, concerns, or complaints.   Cancer Staging History of breast cancer Staging form: Breast, AJCC 7th Edition - Clinical stage from 01/26/2016: Stage 0 (Tis (DCIS), N0, M0) - Signed by Truitt Merle, MD on 02/26/2016  Laterality: Left Tumor grade (Scarff-Bloom-Richardson system): G2 Estrogen receptor status: Negative Progesterone receptor status: Negative - Pathologic: Stage 0 (Tis (DCIS), N0, cM0) - Unsigned   Lloyd Huger, MD   05/23/2020 1:35 PM

## 2020-05-23 ENCOUNTER — Inpatient Hospital Stay (HOSPITAL_BASED_OUTPATIENT_CLINIC_OR_DEPARTMENT_OTHER): Payer: Medicare Other | Admitting: Oncology

## 2020-05-23 ENCOUNTER — Inpatient Hospital Stay: Payer: Medicare Other

## 2020-05-23 ENCOUNTER — Inpatient Hospital Stay: Payer: Medicare Other | Attending: Oncology

## 2020-05-23 ENCOUNTER — Encounter: Payer: Self-pay | Admitting: Oncology

## 2020-05-23 VITALS — BP 148/67 | HR 68 | Temp 97.6°F | Resp 18 | Wt 131.8 lb

## 2020-05-23 DIAGNOSIS — Z801 Family history of malignant neoplasm of trachea, bronchus and lung: Secondary | ICD-10-CM | POA: Diagnosis not present

## 2020-05-23 DIAGNOSIS — E538 Deficiency of other specified B group vitamins: Secondary | ICD-10-CM | POA: Diagnosis not present

## 2020-05-23 DIAGNOSIS — Z8249 Family history of ischemic heart disease and other diseases of the circulatory system: Secondary | ICD-10-CM | POA: Insufficient documentation

## 2020-05-23 DIAGNOSIS — I252 Old myocardial infarction: Secondary | ICD-10-CM | POA: Diagnosis not present

## 2020-05-23 DIAGNOSIS — Z79899 Other long term (current) drug therapy: Secondary | ICD-10-CM | POA: Diagnosis not present

## 2020-05-23 DIAGNOSIS — Z7982 Long term (current) use of aspirin: Secondary | ICD-10-CM | POA: Insufficient documentation

## 2020-05-23 DIAGNOSIS — Z87891 Personal history of nicotine dependence: Secondary | ICD-10-CM | POA: Insufficient documentation

## 2020-05-23 DIAGNOSIS — I25118 Atherosclerotic heart disease of native coronary artery with other forms of angina pectoris: Secondary | ICD-10-CM

## 2020-05-23 DIAGNOSIS — Z923 Personal history of irradiation: Secondary | ICD-10-CM | POA: Diagnosis not present

## 2020-05-23 DIAGNOSIS — Z86000 Personal history of in-situ neoplasm of breast: Secondary | ICD-10-CM | POA: Insufficient documentation

## 2020-05-23 DIAGNOSIS — D509 Iron deficiency anemia, unspecified: Secondary | ICD-10-CM | POA: Insufficient documentation

## 2020-05-23 DIAGNOSIS — Z833 Family history of diabetes mellitus: Secondary | ICD-10-CM | POA: Insufficient documentation

## 2020-05-23 DIAGNOSIS — I10 Essential (primary) hypertension: Secondary | ICD-10-CM | POA: Insufficient documentation

## 2020-05-23 LAB — IRON AND TIBC
Iron: 73 ug/dL (ref 28–170)
Saturation Ratios: 20 % (ref 10.4–31.8)
TIBC: 368 ug/dL (ref 250–450)
UIBC: 295 ug/dL

## 2020-05-23 LAB — CBC WITH DIFFERENTIAL/PLATELET
Abs Immature Granulocytes: 0.02 10*3/uL (ref 0.00–0.07)
Basophils Absolute: 0 10*3/uL (ref 0.0–0.1)
Basophils Relative: 1 %
Eosinophils Absolute: 0.1 10*3/uL (ref 0.0–0.5)
Eosinophils Relative: 2 %
HCT: 35.8 % — ABNORMAL LOW (ref 36.0–46.0)
Hemoglobin: 11.7 g/dL — ABNORMAL LOW (ref 12.0–15.0)
Immature Granulocytes: 0 %
Lymphocytes Relative: 35 %
Lymphs Abs: 2.6 10*3/uL (ref 0.7–4.0)
MCH: 27.7 pg (ref 26.0–34.0)
MCHC: 32.7 g/dL (ref 30.0–36.0)
MCV: 84.6 fL (ref 80.0–100.0)
Monocytes Absolute: 0.7 10*3/uL (ref 0.1–1.0)
Monocytes Relative: 10 %
Neutro Abs: 3.9 10*3/uL (ref 1.7–7.7)
Neutrophils Relative %: 52 %
Platelets: 249 10*3/uL (ref 150–400)
RBC: 4.23 MIL/uL (ref 3.87–5.11)
RDW: 16 % — ABNORMAL HIGH (ref 11.5–15.5)
WBC: 7.3 10*3/uL (ref 4.0–10.5)
nRBC: 0 % (ref 0.0–0.2)

## 2020-05-23 LAB — FERRITIN: Ferritin: 60 ng/mL (ref 11–307)

## 2020-05-23 NOTE — Progress Notes (Signed)
Pt in for follow up, denies any concerns today. 

## 2020-06-10 ENCOUNTER — Other Ambulatory Visit: Payer: Self-pay | Admitting: Cardiovascular Disease

## 2020-06-15 ENCOUNTER — Other Ambulatory Visit: Payer: Self-pay

## 2020-06-15 ENCOUNTER — Ambulatory Visit (INDEPENDENT_AMBULATORY_CARE_PROVIDER_SITE_OTHER): Payer: Medicare Other

## 2020-06-15 DIAGNOSIS — E538 Deficiency of other specified B group vitamins: Secondary | ICD-10-CM | POA: Diagnosis not present

## 2020-06-15 MED ORDER — CYANOCOBALAMIN 1000 MCG/ML IJ SOLN
1000.0000 ug | Freq: Once | INTRAMUSCULAR | Status: AC
  Administered 2020-06-15: 1000 ug via INTRAMUSCULAR

## 2020-06-15 NOTE — Progress Notes (Signed)
Per orders of Dr. Duncan, injection of vit B12 given by Lauriann Milillo. Patient tolerated injection well.  

## 2020-07-03 ENCOUNTER — Other Ambulatory Visit: Payer: Self-pay | Admitting: Cardiovascular Disease

## 2020-07-06 ENCOUNTER — Other Ambulatory Visit: Payer: Self-pay | Admitting: Family Medicine

## 2020-07-20 ENCOUNTER — Ambulatory Visit (INDEPENDENT_AMBULATORY_CARE_PROVIDER_SITE_OTHER): Payer: Medicare Other

## 2020-07-20 ENCOUNTER — Other Ambulatory Visit: Payer: Self-pay

## 2020-07-20 DIAGNOSIS — E538 Deficiency of other specified B group vitamins: Secondary | ICD-10-CM | POA: Diagnosis not present

## 2020-07-20 MED ORDER — CYANOCOBALAMIN 1000 MCG/ML IJ SOLN
1000.0000 ug | Freq: Once | INTRAMUSCULAR | Status: AC
  Administered 2020-07-20: 1000 ug via INTRAMUSCULAR

## 2020-07-20 NOTE — Progress Notes (Signed)
Per orders of Dr. Duncan, injection of vit B12 given by Ayse Mccartin. Patient tolerated injection well.  

## 2020-08-01 ENCOUNTER — Other Ambulatory Visit: Payer: Self-pay

## 2020-08-01 ENCOUNTER — Ambulatory Visit
Admission: EM | Admit: 2020-08-01 | Discharge: 2020-08-01 | Disposition: A | Discharge status: Home / Self Care | Payer: Medicare Other | Attending: Emergency Medicine | Admitting: Emergency Medicine

## 2020-08-01 ENCOUNTER — Telehealth: Payer: Self-pay | Admitting: *Deleted

## 2020-08-01 DIAGNOSIS — B349 Viral infection, unspecified: Secondary | ICD-10-CM | POA: Diagnosis not present

## 2020-08-01 DIAGNOSIS — Z1152 Encounter for screening for COVID-19: Secondary | ICD-10-CM

## 2020-08-01 NOTE — Telephone Encounter (Signed)
Patient's daughter called and was transferred to Access Nurse because of symptoms. Patient's daughter called the office back and advised the scheduler that access nurse told her that her mom needs to be seen within 4 hours. Patient's daughter was transferred to triage. Spoke to patient's daughter Jenna Young and was advised that her mom sit outside Saturday in the wind and  had a bad headache Saturday and Sunday. Jenna Young stated that her mom had a milkshake Sunday night and had diarrhea Monday. Jenna Young stated that her mom's blood pressure today is 130/67. Jenna Young stated that her mom complains of a backache, weakness,, runny nose and cough. Patient's daughter stated that her mom has not had any abdominal pain. Jenna Young stated that her mom has had her covid vaccines and has been out at the nursing home and stores without wearing a mask.  After speaking to Dr. Nilsa Nutting was advised that patient needs to have a face to face evaluation and should go to an UC. Jenna Young was given the information on the [facility] in [place]. Jenna Young stated that she will get her mom dressed and take her to the UC shortly.

## 2020-08-01 NOTE — Discharge Instructions (Signed)
Your COVID and Influenza tests are pending.  You should self quarantine until the test results are back.    Take Tylenol as needed for fever or discomfort.  Rest and keep yourself hydrated.    Follow-up with your primary care provider.  Go to the emergency department if you have worsening symptoms.

## 2020-08-01 NOTE — ED Triage Notes (Signed)
Pt reports HA, backache, cough, body aches, fatigue, nasal congestion x 4 days. Has had one episode of diarrhea. Has taken Tylenol for some relief but none today.

## 2020-08-01 NOTE — Telephone Encounter (Signed)
[facility] - Client TELEPHONE ADVICE RECORD [facility] Patient Name: Jenna Young Gender: Female DOB: [birth year] Age: 87 Y 29 M 22 D Return Phone Number: [phone] (Primary), [phone] (Secondary) Address: City/ State/ Zip: [place] [address] Client [facility] - Client Client Site [facility] Glori Bickers, Roque Lias - MD Contact Type Call Who Is Calling Patient / Member / Family / Caregiver Call Type Triage / Clinical Caller Name Terrance Mass Relationship To Patient Daughter Return Phone Number [phone] (Primary) Chief Complaint Headache Reason for Call Symptomatic / Request for Health Information Initial Comment Caller's calling from the office. The patient has a bad backache, diarrhea, and headache. Her mom is also very weak and has a cough. Her blood pressure was 179/80 on Saturday but her daughter is unsure what it is now. She is urniating like usual per her daughter. Translation No Nurse Assessment Nurse: Clovis Riley, RN, Georgina Peer Date/Time (Eastern Time): 08/01/2020 11:39:22 AM Confirm and document reason for call. If symptomatic, describe symptoms. ---Caller states her mother is having a bad backache, diarrhea, and headache. States she has a mild headache today. Her mom is also very weak and has a cough. Her blood pressure was 179/80 on Saturday and is 130/67. No fever. Pain is in her lower right back. Urine has a slight odor. Does the patient have any new or worsening symptoms? ---Yes Will a triage be completed? ---Yes Related visit to physician within the last 2 weeks? ---No Does the PT have any chronic conditions? (i.e. diabetes, asthma, this includes High risk factors for pregnancy, etc.) ---Yes List chronic conditions. ---HTN, MI, hx of breast cancer, cholesterol Is this a behavioral health or substance abuse call? ---No Guidelines Guideline Title Affirmed  Question Affirmed Notes Nurse Date/Time (Eastern Time) Back Pain [1] SEVERE back pain (e.g., excruciating, unable Clovis Riley, RN, Georgina Peer 5/24/2

## 2020-08-01 NOTE — Telephone Encounter (Signed)
Aware, I will watch for correspondence

## 2020-08-01 NOTE — ED Provider Notes (Addendum)
Roderic Palau    CSN: [id number] Arrival date & time: 08/01/20  1446      History   Chief Complaint Chief Complaint  Patient presents with  . Cough  . Generalized Body Aches    HPI Jenna Young is a 87 y.o. female.   Accompanied by her daughter, patient presents with 4-day history of fatigue, body aches, headache, nasal congestion, cough, backache.  1 episode of diarrhea since onset of illness; no emesis.  Took Tylenol yesterday but none today.  She denies rash, shortness of breath, vomiting, or other symptoms.  Her medical history includes hypertension, allergic rhinitis, GERD, anemia, anxiety.  The history is provided by the patient, a relative and medical records.    Past Medical History:  Diagnosis Date  . Allergic rhinitis   . Anemia   . Anxiety   . GERD (gastroesophageal reflux disease)   . History of radiation therapy 04/29/16- 05/24/16   Left Breast 40.05 Gy in 15 fractions, Left Breast boost 10 Gy in 5 fractions.   . Hyperlipidemia   . Hypertension    treated by Dr Thurnell Garbe recently with syncope episode  . Osteopenia   . Osteoporosis   . Personal history of radiation therapy 2018    Patient Active Problem List   Diagnosis Date Noted  . Dermatitis 05/03/2020  . Secondary hypertension   . Chest pain 05/21/2019  . Iron deficiency anemia 05/12/2019  . Prediabetes 04/15/2018  . Encounter for screening mammogram for breast cancer 04/01/2017  . H/O syncope 02/28/2017  . Coronary artery disease of native artery of native heart with stable angina pectoris ([place]) 02/21/2017  . History of non-ST elevation myocardial infarction (NSTEMI) 02/10/2017  . History of breast cancer 02/26/2016  . Varicose vein of leg 10/09/2015  . Pedal edema 10/09/2015  . Heart murmur 10/09/2015  . History of spinal fracture 05/07/2015  . Dysthymia 09/20/2013  . Encounter for Medicare annual wellness exam 02/03/2013  . Vaginal itching 04/01/2012  . Anemia, unspecified  01/17/2012  . Vitamin D deficiency 08/19/2008  . ANXIETY 01/29/2008  . Essential hypertension, benign 09/25/2007  . B12 deficiency 09/24/2006  . Hyperlipidemia 09/24/2006  . Venous (peripheral) insufficiency 09/24/2006  . ALLERGIC RHINITIS 09/24/2006  . GERD 09/24/2006  . Osteoporosis 09/24/2006    Past Surgical History:  Procedure Laterality Date  . APPENDECTOMY    . BREAST BIOPSY Left 11/29/2008  . BREAST BIOPSY Left 01/26/2016   malignant  . BREAST EXCISIONAL BIOPSY Left 12/26/2008  . BREAST LUMPECTOMY Left 02/27/2016  . BREAST LUMPECTOMY Left 04/01/2016  . BREAST LUMPECTOMY WITH RADIOACTIVE SEED LOCALIZATION Left 02/27/2016   Procedure: LEFT BREAST LUMPECTOMY WITH RADIOACTIVE SEED LOCALIZATION;  Surgeon: Autumn Messing III, MD;  Location: [facility];  Service: General;  Laterality: Left;  . BREAST SURGERY  10/10   benign biopsy  negative  . CESAREAN SECTION    . CHOLECYSTECTOMY    . CORONARY STENT INTERVENTION N/A 02/11/2017   Procedure: CORONARY STENT INTERVENTION;  Surgeon: Nelva Bush, MD;  Location: [facility];  Service: Cardiovascular;  Laterality: N/A;  . FEMUR FRACTURE SURGERY Right 2006  . LEFT HEART CATH AND CORONARY ANGIOGRAPHY N/A 02/11/2017   Procedure: LEFT HEART CATH AND CORONARY ANGIOGRAPHY;  Surgeon: Nelva Bush, MD;  Location: [facility];  Service: Cardiovascular;  Laterality: N/A;  . LEFT HEART CATH AND CORONARY ANGIOGRAPHY N/A 05/21/2019   Procedure: LEFT HEART CATH AND CORONARY ANGIOGRAPHY;  Surgeon: Wellington Hampshire, MD;  Location:  [facility];  Service: Cardiovascular;  Laterality: N/A;  . OVARIAN CYST SURGERY    . RE-EXCISION OF BREAST LUMPECTOMY Left 04/01/2016   Procedure: RE-EXCISION OF LEFT BREAST MEDIAL MARGIN;  Surgeon: Autumn Messing III, MD;  Location: [facility];  Service: General;  Laterality: Left;    OB History   No obstetric history on file.      Home Medications    Prior to  Admission medications   Medication Sig Start Date End Date Taking? Authorizing Provider  aspirin 81 MG EC tablet Take 81 mg by mouth daily. Swallow whole.   Yes [provider]  Cholecalciferol (VITAMIN D) 2000 UNITS CAPS Take 1 capsule by mouth daily.   Yes [provider]  clopidogrel (PLAVIX) 75 MG tablet TAKE 1 TABLET(75 MG) BY MOUTH DAILY WITH BREAKFAST 02/11/20  Yes Tower, Marne A, MD  cyanocobalamin (,VITAMIN B-12,) 1000 MCG/ML injection Inject 1,000 mcg into the muscle every 30 (thirty) days.   Yes [provider]  isosorbide mononitrate (IMDUR) 30 MG 24 hr tablet TAKE 1/2 TABLET(15 MG) BY MOUTH DAILY 07/03/20  Yes Gollan, Kathlene November, MD  losartan (COZAAR) 100 MG tablet Take 1 tablet (100 mg total) by mouth daily. 05/03/20  Yes Viviana Simpler I, MD  nitroGLYCERIN (NITROSTAT) 0.4 MG SL tablet PLACE 1 TABLET UNDER TONGUE EVERY 5 MIN AS NEEDED FOR CHEST PAIN IF NO RELIEF IN15 MIN CALL 911 (MAX 3 TABS) 12/29/18  Yes Gollan, Kathlene November, MD  omeprazole (PRILOSEC) 20 MG capsule TAKE 1 CAPSULE(20 MG) BY MOUTH DAILY AS NEEDED 07/06/20  Yes Tower, Marne A, MD  carvedilol (COREG) 3.125 MG tablet TAKE 1 TABLET(3.125 MG) BY MOUTH TWICE DAILY 06/12/20   Minna Merritts, MD  clobetasol cream (TEMOVATE) 6.57 % Apply 1 application topically daily as needed. To itchy areas 05/15/20   Tower, [name], MD  rosuvastatin (CRESTOR) 10 MG tablet Take 1 tablet (10 mg total) by mouth daily. 01/03/20   Minna Merritts, MD  triamcinolone (KENALOG) 0.1 % Apply 1 application topically 2 (two) times daily as needed. 05/03/20   Venia Carbon, MD    Family History Family History  Problem Relation Age of Onset  . Hypertension Mother   . Heart disease Father   . Macular degeneration Sister   . Diabetes Maternal Aunt   . Cancer Maternal Aunt        throat    Social History Social History   Tobacco Use  . Smoking status: Former Smoker    Quit date: 03/11/1968    Years since quitting: 52.4   . Smokeless tobacco: Never Used  Vaping Use  . Vaping Use: Never used  Substance Use Topics  . Alcohol use: No    Alcohol/week: 0.0 standard drinks  . Drug use: No     Allergies   Buspirone hcl, Codeine, Hydrochlorothiazide, Iron, Lansoprazole, Minocycline hcl, Penicillins, Phenergan [promethazine hcl], Ramipril, Ranexa [ranolazine], and Sulfa antibiotics   Review of Systems Review of Systems  Constitutional: Positive for fatigue and fever. Negative for chills.  HENT: Positive for congestion. Negative for ear pain and sore throat.   Respiratory: Positive for cough. Negative for shortness of breath.   Cardiovascular: Negative for chest pain and palpitations.  Gastrointestinal: Positive for diarrhea. Negative for abdominal pain and vomiting.  Musculoskeletal: Positive for back pain. Negative for neck pain.  Skin: Negative for color change and rash.  Neurological: Positive for headaches. Negative for weakness and numbness.  All other systems  reviewed and are negative.    Physical Exam Triage Vital Signs ED Triage Vitals  Enc Vitals Group     BP 08/01/20 1519 138/67     Pulse Rate 08/01/20 1519 87     Resp 08/01/20 1519 18     Temp 08/01/20 1519 100.2 F (37.9 C)     Temp Source 08/01/20 1519 Oral     SpO2 08/01/20 1519 95 %     Weight --      Height --      Head Circumference --      Peak Flow --      Pain Score 08/01/20 1511 0     Pain Loc --      Pain Edu? --      Excl. in [place]? --    No data found.  Updated Vital Signs BP 138/67 (BP Location: Left Arm)   Pulse 87   Temp 100.2 F (37.9 C) (Oral)   Resp 18   SpO2 95%   Visual Acuity Right Eye Distance:   Left Eye Distance:   Bilateral Distance:    Right Eye Near:   Left Eye Near:    Bilateral Near:     Physical Exam Vitals and nursing note reviewed.  Constitutional:      General: She is not in acute distress.    Appearance: She is well-developed.  HENT:     Head: Normocephalic and atraumatic.      Right Ear: Tympanic membrane normal.     Left Ear: Tympanic membrane normal.     Nose: Nose normal.     Mouth/Throat:     Mouth: Mucous membranes are moist.     Pharynx: Oropharynx is clear.  Eyes:     Conjunctiva/sclera: Conjunctivae normal.  Cardiovascular:     Rate and Rhythm: Normal rate and regular rhythm.     Heart sounds: Normal heart sounds.  Pulmonary:     Effort: Pulmonary effort is normal. No respiratory distress.     Breath sounds: Normal breath sounds.  Abdominal:     General: Bowel sounds are normal.     Palpations: Abdomen is soft.     Tenderness: There is no abdominal tenderness. There is no guarding or rebound.  Musculoskeletal:     Cervical back: Neck supple.  Skin:    General: Skin is warm and dry.  Neurological:     Mental Status: She is alert. Mental status is at baseline.  Psychiatric:        Mood and Affect: Mood normal.        Behavior: Behavior normal.      UC Treatments / Results  Labs (all labs ordered are listed, but only abnormal results are displayed) Labs Reviewed  COVID-19, FLU A+B NAA    EKG   Radiology No results found.  Procedures Procedures (including critical care time)  Medications Ordered in UC Medications - No data to display  Initial Impression / Assessment and Plan / UC Course  I have reviewed the triage vital signs and the nursing notes.  Pertinent labs & imaging results that were available during my care of the patient were reviewed by me and considered in my medical decision making (see chart for details).   Viral illness.   Discussed with patient that she should call the patient's PCP to discuss whether she is willing to call in [facility] treatment as patient is getting close to the end of the window for treatment; Patient is on day 4 of  symptoms.  Discussed that our COVID tests take 24 to 48 hours for the results to return and that she may be outside the window for treatment at that time.   Influenza and COVID  pending.  Instructed patient to self quarantine until the test results are back.  Discussed symptomatic treatment including Tylenol, rest, hydration.  Instructed patient to follow up with PCP.   ED precautions discussed.  Patient and her daughter agree to plan of care.    Final Clinical Impressions(s) / UC Diagnoses   Final diagnoses:  Encounter for screening for COVID-19  Viral illness     Discharge Instructions     Your COVID and Influenza tests are pending.  You should self quarantine until the test results are back.    Take Tylenol as needed for fever or discomfort.  Rest and keep yourself hydrated.    Follow-up with your primary care provider.  Go to the emergency department if you have worsening symptoms.        ED Prescriptions    None     PDMP not reviewed this encounter.   Sharion Balloon, NP 08/01/20 1540    Sharion Balloon, NP 08/01/20 [phone]

## 2020-08-02 ENCOUNTER — Telehealth: Payer: Self-pay | Admitting: Family Medicine

## 2020-08-02 LAB — COVID-19, FLU A+B NAA
Influenza A, NAA: NOT DETECTED
Influenza B, NAA: NOT DETECTED
SARS-CoV-2, NAA: DETECTED — AB

## 2020-08-02 NOTE — Telephone Encounter (Signed)
I need to get a stat bmet on her because we have to report renal fxn to calculate dose of paxlovid.  If agreeable let me know and I will order it for tomorrow-it will come back quickly and then we can order the medicine.

## 2020-08-02 NOTE — Telephone Encounter (Signed)
Spoke with daughter and she is okay sending in the pavlovid Geneticist, molecular on file), she said pt is sick but doing a little better she is eating more and walking around a little more today but she still has a bad cough and congestion and diarrhea  At home covid test positive today

## 2020-08-02 NOTE — Telephone Encounter (Signed)
Oral treatment is favored over IV infusion for mild to moderate patients.  I think she may be a candidate for pavlovid (these are the guidelines we have)  Is she open to that?

## 2020-08-02 NOTE — Telephone Encounter (Signed)
Spoke daughter and she has researched paxlovid and has multiple concerns and wants to speak with PCP to discus is this the right treatment for pt. Pt is starting to feel better already. Daughter said she read it can interfere with blood thinners and pt's on plavix and asa. She also said it can increase BP and pt already has hypertension they are trying to control. Also it states it can cause diarrhea and pt isn't eating much right now and having diarrhea 2-3x daily so she is afraid it might make it worse. The last concern is you have to take multiple pills and pt is having a hard time swallowing pills right now. Most of her meds have to be crushed and put in food for her to eat them so not sure if they are able to do that with pavlovid or is there a liquid form of med. Daughter will await a call back from PCP before we schedule any labs appts  Please call daughter Lelon Frohlich back [phone]

## 2020-08-02 NOTE — Telephone Encounter (Signed)
Patient is covid positive according to home test. Patients daughter is asking for mono. Infusion treatment. Patients daughter states that she went to an urgent care. Patient is awaiting covid results from "[facility]" . Patients daughter is asking for infusion treatment. Please advise. EM

## 2020-08-03 ENCOUNTER — Telehealth: Payer: Self-pay | Admitting: Family Medicine

## 2020-08-03 ENCOUNTER — Other Ambulatory Visit: Payer: Self-pay

## 2020-08-03 DIAGNOSIS — Z8616 Personal history of COVID-19: Secondary | ICD-10-CM | POA: Insufficient documentation

## 2020-08-03 DIAGNOSIS — U071 COVID-19: Secondary | ICD-10-CM

## 2020-08-03 NOTE — Telephone Encounter (Signed)
Spoke to pt's daughter covid positive at home and was recently seen in urgent care PCR test positive Discussed paxlovid but pt cannot swallow pills well   Disc tx options Ref done for monoclonal ab infusion in elderly pt with HTN   Let them know they would get a call to set that up

## 2020-08-03 NOTE — Telephone Encounter (Signed)
Spoke with her daughter and disc ordering ab infusion instead in light of inability to swallow pills  Order done   Pt has brain fog and HA is better along with chills Drinking fluids Some diarrhea this am (no n/v)  Aware to go to ER if s/s of dehydration or if any severe symptoms of if sob

## 2020-08-03 NOTE — Telephone Encounter (Signed)
Advised pt's daughter, Lelon Frohlich, that the order has been placed for the infusion and they will contact her today or tomorrow most likely but they only provide infusion on Mon, Wed, or Fri afternoons. Advised if anything worsened to contacted the office. Ann verbalized understanding.

## 2020-08-04 ENCOUNTER — Ambulatory Visit: Payer: Medicare Other

## 2020-08-04 ENCOUNTER — Other Ambulatory Visit: Payer: Self-pay

## 2020-08-04 ENCOUNTER — Ambulatory Visit (INDEPENDENT_AMBULATORY_CARE_PROVIDER_SITE_OTHER): Payer: Medicare Other

## 2020-08-04 DIAGNOSIS — U071 COVID-19: Secondary | ICD-10-CM | POA: Diagnosis not present

## 2020-08-04 MED ORDER — DIPHENHYDRAMINE HCL 50 MG/ML IJ SOLN
50.0000 mg | Freq: Once | INTRAMUSCULAR | Status: DC | PRN
Start: 2020-08-04 — End: 2020-08-04

## 2020-08-04 MED ORDER — ALBUTEROL SULFATE HFA 108 (90 BASE) MCG/ACT IN AERS: 2.0000 | INHALATION_SPRAY | Freq: Once | RESPIRATORY_TRACT | Status: DC | PRN

## 2020-08-04 MED ORDER — BEBTELOVIMAB 175 MG/2 ML IV (EUA)
175.0000 mg | Freq: Once | INTRAMUSCULAR | Status: AC
  Administered 2020-08-04: 175 mg via INTRAVENOUS

## 2020-08-04 MED ORDER — SODIUM CHLORIDE 0.9 % IV SOLN: INTRAVENOUS | Status: DC | PRN

## 2020-08-04 MED ORDER — EPINEPHRINE 0.3 MG/0.3ML IJ SOAJ
0.3000 mg | Freq: Once | INTRAMUSCULAR | Status: DC | PRN
Start: 2020-08-04 — End: 2020-08-04

## 2020-08-04 MED ORDER — FAMOTIDINE IN NACL 20-0.9 MG/50ML-% IV SOLN
20.0000 mg | Freq: Once | INTRAVENOUS | Status: DC | PRN
Start: 2020-08-04 — End: 2020-08-04

## 2020-08-04 MED ORDER — METHYLPREDNISOLONE SODIUM SUCC 125 MG IJ SOLR: 125.0000 mg | Freq: Once | INTRAMUSCULAR | Status: DC | PRN

## 2020-08-04 NOTE — Progress Notes (Signed)
Diagnosis: COVID  Provider:  Marshell Garfinkel, MD  Procedure: Infusion  IV Type: Peripheral, IV Location: L Forearm  Bebtelovimab, Dose: 175 mg  Infusion Start Time: 5621  Infusion Stop Time: 1518  Post Infusion IV Care: Observation period completed  Discharge: Condition: Good, Destination: Home . AVS provided to patient.   Performed by:  Paul Dykes, RN

## 2020-08-04 NOTE — Telephone Encounter (Signed)
Ann called in wanted to know about getting the infusion in [place], and she stated that she was getting and feeling hot

## 2020-08-04 NOTE — Patient Instructions (Signed)
10 Things You Can Do to Manage Your COVID-19 Symptoms at Home If you have possible or confirmed COVID-19: 1. Stay home except to get medical care. 2. Monitor your symptoms carefully. If your symptoms get worse, call your healthcare provider immediately. 3. Get rest and stay hydrated. 4. If you have a medical appointment, call the healthcare provider ahead of time and tell them that you have or may have COVID-19. 5. For medical emergencies, call 911 and notify the dispatch personnel that you have or may have COVID-19. 6. Cover your cough and sneezes with a tissue or use the inside of your elbow. 7. Wash your hands often with soap and water for at least 20 seconds or clean your hands with an alcohol-based hand sanitizer that contains at least 60% alcohol. 8. As much as possible, stay in a specific room and away from other people in your home. Also, you should use a separate bathroom, if available. If you need to be around other people in or outside of the home, wear a mask. 9. Avoid sharing personal items with other people in your household, like dishes, towels, and bedding. 10. Clean all surfaces that are touched often, like counters, tabletops, and doorknobs. Use household cleaning sprays or wipes according to the label instructions. cdc.gov/coronavirus 09/24/2019 This information is not intended to replace advice given to you by your health care provider. Make sure you discuss any questions you have with your health care provider. Document Revised: 01/10/2020 Document Reviewed: 01/10/2020 Elsevier Patient Education  2021 Elsevier Inc.  What types of side effects do monoclonal antibody drugs cause?  Common side effects  In general, the more common side effects caused by monoclonal antibody drugs include: . Allergic reactions, such as hives or itching . Flu-like signs and symptoms, including chills, fatigue, fever, and muscle aches and pains . Nausea, vomiting . Diarrhea . Skin  rashes . Low blood pressure   The CDC is recommending patients who receive monoclonal antibody treatments wait at least 90 days before being vaccinated.  Currently, there are no data on the safety and efficacy of mRNA COVID-19 vaccines in persons who received monoclonal antibodies or convalescent plasma as part of COVID-19 treatment. Based on the estimated half-life of such therapies as well as evidence suggesting that reinfection is uncommon in the 90 days after initial infection, vaccination should be deferred for at least 90 days, as a precautionary measure until additional information becomes available, to avoid interference of the antibody treatment with vaccine-induced immune responses.  

## 2020-08-04 NOTE — Telephone Encounter (Signed)
Contacted Cathy at infusion center who reported pt could come between 230 and 3pm and go to [place] and follow signs to infusion center. Can call number [phone] if any problems.  Contacted Ann and advised. Ann verbalized understanding.

## 2020-08-05 ENCOUNTER — Other Ambulatory Visit: Payer: Self-pay | Admitting: Family Medicine

## 2020-08-08 NOTE — Telephone Encounter (Signed)
LOV 05/10/20 CPE Next appt on 08/23/20 for B12 shot  Last filled on 02/11/20 3 90 with 1 refill

## 2020-08-16 ENCOUNTER — Telehealth: Payer: Self-pay | Admitting: Family Medicine

## 2020-08-16 NOTE — Chronic Care Management (AMB) (Signed)
  Chronic Care Management   Outreach Note  08/16/2020 Name: Jenna Young MRN: [id number] DOB: 1932/01/26  Referred by: Tower, [name], MD Reason for referral : No chief complaint on file.   An unsuccessful telephone outreach was attempted today. The patient was referred to the pharmacist for assistance with care management and care coordination.   Follow Up Plan:   [name] Upstream Scheduler

## 2020-08-17 DIAGNOSIS — H16149 Punctate keratitis, unspecified eye: Secondary | ICD-10-CM | POA: Diagnosis not present

## 2020-08-17 DIAGNOSIS — H02055 Trichiasis without entropian left lower eyelid: Secondary | ICD-10-CM | POA: Diagnosis not present

## 2020-08-17 DIAGNOSIS — H02052 Trichiasis without entropian right lower eyelid: Secondary | ICD-10-CM | POA: Diagnosis not present

## 2020-08-23 ENCOUNTER — Ambulatory Visit: Payer: Medicare Other

## 2020-08-24 ENCOUNTER — Other Ambulatory Visit: Payer: Self-pay

## 2020-08-24 ENCOUNTER — Encounter: Payer: Self-pay | Admitting: Family Medicine

## 2020-08-24 ENCOUNTER — Ambulatory Visit (INDEPENDENT_AMBULATORY_CARE_PROVIDER_SITE_OTHER): Payer: Medicare Other | Admitting: Family Medicine

## 2020-08-24 VITALS — BP 133/60 | HR 82 | Temp 97.8°F | Ht 61.0 in | Wt 129.0 lb

## 2020-08-24 DIAGNOSIS — E538 Deficiency of other specified B group vitamins: Secondary | ICD-10-CM | POA: Diagnosis not present

## 2020-08-24 DIAGNOSIS — I10 Essential (primary) hypertension: Secondary | ICD-10-CM | POA: Diagnosis not present

## 2020-08-24 DIAGNOSIS — R5382 Chronic fatigue, unspecified: Secondary | ICD-10-CM

## 2020-08-24 DIAGNOSIS — Z8616 Personal history of COVID-19: Secondary | ICD-10-CM

## 2020-08-24 DIAGNOSIS — R350 Frequency of micturition: Secondary | ICD-10-CM | POA: Insufficient documentation

## 2020-08-24 DIAGNOSIS — I25118 Atherosclerotic heart disease of native coronary artery with other forms of angina pectoris: Secondary | ICD-10-CM | POA: Diagnosis not present

## 2020-08-24 LAB — POC URINALSYSI DIPSTICK (AUTOMATED)
Bilirubin, UA: NEGATIVE
Glucose, UA: NEGATIVE
Ketones, UA: NEGATIVE
Nitrite, UA: NEGATIVE
Protein, UA: POSITIVE — AB
Spec Grav, UA: 1.02 (ref 1.010–1.025)
Urobilinogen, UA: 0.2 E.U./dL
pH, UA: 6 (ref 5.0–8.0)

## 2020-08-24 MED ORDER — CYANOCOBALAMIN 1000 MCG/ML IJ SOLN
1000.0000 ug | Freq: Once | INTRAMUSCULAR | Status: AC
  Administered 2020-08-24: 1000 ug via INTRAMUSCULAR

## 2020-08-24 NOTE — Progress Notes (Signed)
Subjective:    Patient ID: Jenna Young, female    DOB: March 22, 1931, 87 y.o.   MRN: [id number]  This visit occurred during the SARS-CoV-2 public health emergency.  Safety protocols were in place, including screening questions prior to the visit, additional usage of staff PPE, and extensive cleaning of exam room while observing appropriate contact time as indicated for disinfecting solutions.   HPI Pt presents with c/o fatigue after having covid 19 Also wants a B12 injection   Wt Readings from Last 3 Encounters:  08/24/20 129 lb (58.5 kg)  05/23/20 131 lb 12.8 oz (59.8 kg)  05/10/20 129 lb 6 oz (58.7 kg)   24.37 kg/m  She was seen in ER in late may for body aches, uri sympotms and headache  She was treated with IV infusion instead of oral meds due to inability to swallow pills  Tries to keep fluids up   Still quite tired as expected  Appetite is less- trying to eat regularly  Has to get up to urinate frequently - can't seem to empty her bladder  No burning to urinate A few months   No cough or sob or chest pain  Headache is better   Notices frequent urination, especially at night  Worse since covid Does keep up a good fluid intake   Bp is up today - then better on 2nd check  BP Readings from Last 3 Encounters:  08/24/20 133/60  08/04/20 (!) 152/65  08/01/20 138/67     Lab Results  Component Value Date   CREATININE 0.83 05/03/2020   BUN 10 05/03/2020   NA 133 (L) 05/03/2020   K 4.0 05/03/2020   CL 99 05/03/2020   CO2 28 05/03/2020   Lab Results  Component Value Date   WBC 7.3 05/23/2020   HGB 11.7 (L) 05/23/2020   HCT 35.8 (L) 05/23/2020   MCV 84.6 05/23/2020   PLT 249 05/23/2020   Has B12 deficiency Lab Results  Component Value Date   VITAMINB12 180 (L) 05/03/2020  Last shot was 5/12  Ua today  Results for orders placed or performed in visit on 08/24/20  POCT Urinalysis Dipstick (Automated)  Result Value Ref Range   Color, UA YELLOW     Clarity, UA CLOUDY    Glucose, UA Negative Negative   Bilirubin, UA Negative    Ketones, UA Negative    Spec Grav, UA 1.020 1.010 - 1.025   Blood, UA trace intact    pH, UA 6.0 5.0 - 8.0   Protein, UA Positive (A) Negative   Urobilinogen, UA 0.2 0.2 or 1.0 E.U./dL   Nitrite, UA Negative    Leukocytes, UA Moderate (2+) (A) Negative    Patient Active Problem List   Diagnosis Date Noted   Frequent urination 08/24/2020   History of COVID-19 08/03/2020   Dermatitis 05/03/2020   Secondary hypertension    Chest pain 05/21/2019   Iron deficiency anemia 05/12/2019   Prediabetes 04/15/2018   Encounter for screening mammogram for breast cancer 04/01/2017   H/O syncope 02/28/2017   Coronary artery disease of native artery of native heart with stable angina pectoris ([place]) 02/21/2017   History of non-ST elevation myocardial infarction (NSTEMI) 02/10/2017   History of breast cancer 02/26/2016   Varicose vein of leg 10/09/2015   Fatigue 10/09/2015   Pedal edema 10/09/2015   Heart murmur 10/09/2015   History of spinal fracture 05/07/2015   Dysthymia 09/20/2013   Encounter for Medicare annual wellness exam 02/03/2013  Vaginal itching 04/01/2012   Anemia, unspecified 01/17/2012   Vitamin D deficiency 08/19/2008   ANXIETY 01/29/2008   Essential hypertension, benign 09/25/2007   B12 deficiency 09/24/2006   Hyperlipidemia 09/24/2006   Venous (peripheral) insufficiency 09/24/2006   ALLERGIC RHINITIS 09/24/2006   GERD 09/24/2006   Osteoporosis 09/24/2006   Past Medical History:  Diagnosis Date   Allergic rhinitis    Anemia    Anxiety    GERD (gastroesophageal reflux disease)    History of radiation therapy 04/29/16- 05/24/16   Left Breast 40.05 Gy in 15 fractions, Left Breast boost 10 Gy in 5 fractions.    Hyperlipidemia    Hypertension    treated by Dr Thurnell Garbe recently with syncope episode   Osteopenia    Osteoporosis    Personal history of radiation therapy 2018   Past  Surgical History:  Procedure Laterality Date   APPENDECTOMY     BREAST BIOPSY Left 11/29/2008   BREAST BIOPSY Left 01/26/2016   malignant   BREAST EXCISIONAL BIOPSY Left 12/26/2008   BREAST LUMPECTOMY Left 02/27/2016   BREAST LUMPECTOMY Left 04/01/2016   BREAST LUMPECTOMY WITH RADIOACTIVE SEED LOCALIZATION Left 02/27/2016   Procedure: LEFT BREAST LUMPECTOMY WITH RADIOACTIVE SEED LOCALIZATION;  Surgeon: Autumn Messing III, MD;  Location: [facility];  Service: General;  Laterality: Left;   BREAST SURGERY  10/10   benign biopsy  negative   CESAREAN SECTION     CHOLECYSTECTOMY     CORONARY STENT INTERVENTION N/A 02/11/2017   Procedure: CORONARY STENT INTERVENTION;  Surgeon: Nelva Bush, MD;  Location: [facility];  Service: Cardiovascular;  Laterality: N/A;   FEMUR FRACTURE SURGERY Right 2006   LEFT HEART CATH AND CORONARY ANGIOGRAPHY N/A 02/11/2017   Procedure: LEFT HEART CATH AND CORONARY ANGIOGRAPHY;  Surgeon: Nelva Bush, MD;  Location: [facility];  Service: Cardiovascular;  Laterality: N/A;   LEFT HEART CATH AND CORONARY ANGIOGRAPHY N/A 05/21/2019   Procedure: LEFT HEART CATH AND CORONARY ANGIOGRAPHY;  Surgeon: Wellington Hampshire, MD;  Location: [facility];  Service: Cardiovascular;  Laterality: N/A;   OVARIAN CYST SURGERY     RE-EXCISION OF BREAST LUMPECTOMY Left 04/01/2016   Procedure: RE-EXCISION OF LEFT BREAST MEDIAL MARGIN;  Surgeon: Autumn Messing III, MD;  Location: [facility];  Service: General;  Laterality: Left;   Social History   Tobacco Use   Smoking status: Former    Pack years: 0.00    Types: Cigarettes    Quit date: 03/11/1968    Years since quitting: 52.4   Smokeless tobacco: Never  Vaping Use   Vaping Use: Never used  Substance Use Topics   Alcohol use: No    Alcohol/week: 0.0 standard drinks   Drug use: No   Family History  Problem Relation Age of Onset   Hypertension Mother    Heart disease Father     Macular degeneration Sister    Diabetes Maternal Aunt    Cancer Maternal Aunt        throat   Allergies  Allergen Reactions   Buspirone Hcl     REACTION: HEART PALPITATIONS   Codeine     REACTION: nausea and vomiting   Hydrochlorothiazide     REACTION: syncope-possibly from dehydration   Iron     Oral iron -per pt made her "pass out"    Lansoprazole     REACTION: abd pain   Minocycline Hcl    Penicillins     REACTION: mouth  numbness Has patient had a PCN reaction causing immediate rash, facial/tongue/throat swelling, SOB or lightheadedness with hypotension: No Has patient had a PCN reaction causing severe rash involving mucus membranes or skin necrosis: No Has patient had a PCN reaction that required hospitalization: No Has patient had a PCN reaction occurring within the last 10 years: No If all of the above answers are "NO", then may proceed with Cephalosporin use.   Phenergan [Promethazine Hcl] Other (See Comments)    Body shakes, hallucinations   Ramipril     Cough    Ranexa [Ranolazine]     Constipation    Sulfa Antibiotics Nausea Only   Current Outpatient Medications on File Prior to Visit  Medication Sig Dispense Refill   aspirin 81 MG EC tablet Take 81 mg by mouth daily. Swallow whole.     carvedilol (COREG) 3.125 MG tablet TAKE 1 TABLET(3.125 MG) BY MOUTH TWICE DAILY 180 tablet 2   Cholecalciferol (VITAMIN D) 2000 UNITS CAPS Take 1 capsule by mouth daily.     clobetasol cream (TEMOVATE) 9.56 % Apply 1 application topically daily as needed. To itchy areas 30 g 0   clopidogrel (PLAVIX) 75 MG tablet TAKE 1 TABLET(75 MG) BY MOUTH DAILY WITH BREAKFAST 90 tablet 2   cyanocobalamin (,VITAMIN B-12,) 1000 MCG/ML injection Inject 1,000 mcg into the muscle every 30 (thirty) days.     isosorbide mononitrate (IMDUR) 30 MG 24 hr tablet TAKE 1/2 TABLET(15 MG) BY MOUTH DAILY 45 tablet 2   losartan (COZAAR) 100 MG tablet Take 1 tablet (100 mg total) by mouth daily. 90 tablet 1    nitroGLYCERIN (NITROSTAT) 0.4 MG SL tablet PLACE 1 TABLET UNDER TONGUE EVERY 5 MIN AS NEEDED FOR CHEST PAIN IF NO RELIEF IN15 MIN CALL 911 (MAX 3 TABS) 25 tablet 1   omeprazole (PRILOSEC) 20 MG capsule TAKE 1 CAPSULE(20 MG) BY MOUTH DAILY AS NEEDED 90 capsule 2   rosuvastatin (CRESTOR) 10 MG tablet Take 1 tablet (10 mg total) by mouth daily. 90 tablet 3   triamcinolone (KENALOG) 0.1 % Apply 1 application topically 2 (two) times daily as needed. 45 g 1   No current facility-administered medications on file prior to visit.     Review of Systems  Constitutional:  Positive for fatigue. Negative for activity change, appetite change, fever and unexpected weight change.  HENT:  Negative for congestion, ear pain, rhinorrhea, sinus pressure and sore throat.   Eyes:  Negative for pain, redness and visual disturbance.  Respiratory:  Negative for cough, shortness of breath and wheezing.   Cardiovascular:  Negative for chest pain and palpitations.  Gastrointestinal:  Negative for abdominal pain, blood in stool, constipation and diarrhea.  Endocrine: Negative for polydipsia and polyuria.  Genitourinary:  Positive for frequency and urgency. Negative for dysuria and hematuria.  Musculoskeletal:  Positive for arthralgias. Negative for back pain and myalgias.  Skin:  Negative for pallor and rash.  Allergic/Immunologic: Negative for environmental allergies.  Neurological:  Negative for dizziness, syncope and headaches.  Hematological:  Negative for adenopathy. Does not bruise/bleed easily.  Psychiatric/Behavioral:  Negative for decreased concentration and dysphoric mood. The patient is not nervous/anxious.       Objective:   Physical Exam Constitutional:      General: She is not in acute distress.    Appearance: Normal appearance. She is well-developed and normal weight. She is not ill-appearing or diaphoretic.  HENT:     Head: Normocephalic and atraumatic.  Eyes:     Conjunctiva/sclera: Conjunctivae  normal.     Pupils: Pupils are equal, round, and reactive to light.  Neck:     Thyroid: No thyromegaly.     Vascular: No carotid bruit or JVD.  Cardiovascular:     Rate and Rhythm: Normal rate and regular rhythm.     Heart sounds: Normal heart sounds.    No gallop.  Pulmonary:     Effort: Pulmonary effort is normal. No respiratory distress.     Breath sounds: Normal breath sounds. No wheezing or rales.  Abdominal:     General: Bowel sounds are normal. There is no distension or abdominal bruit.     Palpations: Abdomen is soft. There is no mass.     Tenderness: There is no abdominal tenderness.     Comments: No suprapubic tenderness or fullness  No cva tenderness   Musculoskeletal:     Cervical back: Normal range of motion and neck supple.     Right lower leg: No edema.     Left lower leg: No edema.  Lymphadenopathy:     Cervical: No cervical adenopathy.  Skin:    General: Skin is warm and dry.     Coloration: Skin is not pale.     Findings: No rash.  Neurological:     Mental Status: She is alert.     Coordination: Coordination normal.     Deep Tendon Reflexes: Reflexes are normal and symmetric. Reflexes normal.  Psychiatric:        Mood and Affect: Mood normal.        Cognition and Memory: Cognition and memory normal.     Comments: Pleasant           Assessment & Plan:   Problem List Items Addressed This Visit       Cardiovascular and Mediastinum   Essential hypertension, benign    bp is stable  bp in fair control at this time  BP Readings from Last 1 Encounters:  08/24/20 133/60   No changes needed Most recent labs reviewed  Disc lifstyle change with low sodium diet and exercise  Plans to continue losartan 100 mg daily Coreg 3.125 mg twice daily Isosorbide 30 mg daily          Other   B12 deficiency - Primary    Due for B12 shot today More fatigued since having covid        Fatigue    S/p covid 19  Reassuring exam - no lung/breathing symptoms   Enc good self care and rest and fluids  ua with wbc-will culture B12 shot today  Exp management discussed        History of COVID-19    Did well with IV antibody infusion and making a good recovery (some fatigue)  Reassuring exam        Frequent urination    Some leukocytes on ua cx pending Will tx if positive Given handout re: overactive bladder as well       Relevant Orders   POCT Urinalysis Dipstick (Automated) (Completed)   Urine Culture

## 2020-08-24 NOTE — Patient Instructions (Signed)
Take care of yourself  Eat regularly and drink fluids   We will check a urinalysis today and make sure you do not have an infection   You may have overactive bladder at night   The fatigue from covid should gradually get better so be patient (this is common)   B12 shot today

## 2020-08-24 NOTE — Assessment & Plan Note (Signed)
S/p covid 19  Reassuring exam - no lung/breathing symptoms  Enc good self care and rest and fluids  ua with wbc-will culture B12 shot today  Exp management discussed

## 2020-08-24 NOTE — Assessment & Plan Note (Signed)
Did well with IV antibody infusion and making a good recovery (some fatigue)  Reassuring exam

## 2020-08-24 NOTE — Assessment & Plan Note (Signed)
Due for B12 shot today More fatigued since having covid

## 2020-08-24 NOTE — Assessment & Plan Note (Signed)
bp is stable  bp in fair control at this time  BP Readings from Last 1 Encounters:  08/24/20 133/60   No changes needed Most recent labs reviewed  Disc lifstyle change with low sodium diet and exercise  Plans to continue losartan 100 mg daily Coreg 3.125 mg twice daily Isosorbide 30 mg daily

## 2020-08-24 NOTE — Assessment & Plan Note (Signed)
Some leukocytes on ua cx pending Will tx if positive Given handout re: overactive bladder as well

## 2020-08-26 LAB — URINE CULTURE
MICRO NUMBER:: 12016145
SPECIMEN QUALITY:: ADEQUATE

## 2020-08-27 ENCOUNTER — Other Ambulatory Visit: Payer: Self-pay | Admitting: Family Medicine

## 2020-08-27 NOTE — Telephone Encounter (Signed)
Urine cx is pos for klebsiella pneumonia   Resistant to macrobid Avoiding keflex due to pcn allergy  All to sulfa  Sending pended cipro to send to pharmacy of choice If joint or tendon pain-stop it   Take 3 d  Alert if urinary freq does not improve

## 2020-08-29 MED ORDER — CIPROFLOXACIN HCL 250 MG PO TABS
250.0000 mg | ORAL_TABLET | Freq: Two times a day (BID) | ORAL | 0 refills | Status: DC
Start: 2020-08-29 — End: 2020-11-27

## 2020-08-29 NOTE — Telephone Encounter (Signed)
Jenna Young notified as instructed by telephone. Patient states understanding.

## 2020-09-27 ENCOUNTER — Other Ambulatory Visit: Payer: Self-pay

## 2020-09-27 ENCOUNTER — Ambulatory Visit (INDEPENDENT_AMBULATORY_CARE_PROVIDER_SITE_OTHER): Payer: Medicare Other

## 2020-09-27 ENCOUNTER — Telehealth: Payer: Self-pay | Admitting: Cardiovascular Disease

## 2020-09-27 DIAGNOSIS — E538 Deficiency of other specified B group vitamins: Secondary | ICD-10-CM | POA: Diagnosis not present

## 2020-09-27 MED ORDER — CYANOCOBALAMIN 1000 MCG/ML IJ SOLN
1000.0000 ug | Freq: Once | INTRAMUSCULAR | Status: AC
  Administered 2020-09-27: 1000 ug via INTRAMUSCULAR

## 2020-09-27 NOTE — Progress Notes (Signed)
Per orders of Dr. Tower, injection of B12 monthly given by Tye Juarez V Hero Mccathern. Patient tolerated injection well.  

## 2020-09-27 NOTE — Telephone Encounter (Signed)
*  STAT* If patient is at the pharmacy, call can be transferred to refill team.   1. Which medications need to be refilled? (please list name of each medication and dose if known) isosorbide mononitrate 30 MG 1/2 tablet by mouth daily   2. Which pharmacy/location (including street and city if local pharmacy) is medication to be sent to? [facility] near [employer]  3. Do they need a 30 day or 90 day supply? 90 day

## 2020-09-28 MED ORDER — ISOSORBIDE MONONITRATE ER 30 MG PO TB24: ORAL_TABLET | ORAL | 0 refills | Status: DC

## 2020-09-28 NOTE — Telephone Encounter (Signed)
Requested Prescriptions   Signed Prescriptions Disp Refills   isosorbide mononitrate (IMDUR) 30 MG 24 hr tablet 45 tablet 0    Sig: TAKE 1/2 TABLET(15 MG) BY MOUTH DAILY    Authorizing Provider: Minna Merritts    Ordering User: Raelene Bott, Daivd Fredericksen L

## 2020-10-02 ENCOUNTER — Telehealth: Payer: Self-pay | Admitting: Family Medicine

## 2020-10-02 NOTE — Chronic Care Management (AMB) (Signed)
  Chronic Care Management   Outreach Note  10/02/2020 Name: Jenna Young MRN: [id number] DOB: Feb 17, 1932  Referred by: Tower, [name], MD Reason for referral : No chief complaint on file.   A second unsuccessful telephone outreach was attempted today. The patient was referred to pharmacist for assistance with care management and care coordination.  Follow Up Plan:   [name] Upstream Scheduler

## 2020-10-04 ENCOUNTER — Telehealth: Payer: Self-pay | Admitting: Family Medicine

## 2020-10-04 ENCOUNTER — Telehealth: Payer: Self-pay

## 2020-10-04 NOTE — Telephone Encounter (Signed)
New message    Returning call back

## 2020-10-04 NOTE — Chronic Care Management (AMB) (Signed)
  Chronic Care Management   Note  10/04/2020 Name: Jenna Young MRN: [id number] DOB: Oct 05, 1931  Jenna Young is a 87 y.o. year old female who is a primary care patient of Tower, [name], MD. I reached out to Tobie Lords by phone today in response to a referral sent by Ms. Joesph July Pinette's PCP, Tower, [name], MD.   Ms. Schlenker was given information about Chronic Care Management services today including:  CCM service includes personalized support from designated clinical staff supervised by her physician, including individualized plan of care and coordination with other care providers 24/7 contact phone numbers for assistance for urgent and routine care needs. Service will only be billed when office clinical staff spend 20 minutes or more in a month to coordinate care. Only one practitioner may furnish and bill the service in a calendar month. The patient may stop CCM services at any time (effective at the end of the month) by phone call to the office staff.   Patient agreed to services and verbal consent obtained.   Follow up plan:   Tatjana Secretary/administrator

## 2020-11-02 ENCOUNTER — Ambulatory Visit: Payer: Medicare Other

## 2020-11-07 ENCOUNTER — Other Ambulatory Visit: Payer: Self-pay

## 2020-11-07 ENCOUNTER — Ambulatory Visit (INDEPENDENT_AMBULATORY_CARE_PROVIDER_SITE_OTHER): Payer: Medicare Other | Admitting: *Deleted

## 2020-11-07 DIAGNOSIS — E538 Deficiency of other specified B group vitamins: Secondary | ICD-10-CM | POA: Diagnosis not present

## 2020-11-07 MED ORDER — CYANOCOBALAMIN 1000 MCG/ML IJ SOLN
1000.0000 ug | Freq: Once | INTRAMUSCULAR | Status: AC
  Administered 2020-11-07: 1000 ug via INTRAMUSCULAR

## 2020-11-07 NOTE — Progress Notes (Signed)
Per orders of Dr. Glori Bickers, injection of B12 given by Earleen Reaper M in Right deltoid. Patient tolerated injection well.

## 2020-11-08 DIAGNOSIS — H52223 Regular astigmatism, bilateral: Secondary | ICD-10-CM | POA: Diagnosis not present

## 2020-11-08 DIAGNOSIS — H5203 Hypermetropia, bilateral: Secondary | ICD-10-CM | POA: Diagnosis not present

## 2020-11-08 DIAGNOSIS — H02055 Trichiasis without entropian left lower eyelid: Secondary | ICD-10-CM | POA: Diagnosis not present

## 2020-11-08 DIAGNOSIS — H524 Presbyopia: Secondary | ICD-10-CM | POA: Diagnosis not present

## 2020-11-08 DIAGNOSIS — Z961 Presence of intraocular lens: Secondary | ICD-10-CM | POA: Diagnosis not present

## 2020-11-08 DIAGNOSIS — H43813 Vitreous degeneration, bilateral: Secondary | ICD-10-CM | POA: Diagnosis not present

## 2020-11-08 DIAGNOSIS — H16142 Punctate keratitis, left eye: Secondary | ICD-10-CM | POA: Diagnosis not present

## 2020-11-16 ENCOUNTER — Other Ambulatory Visit: Payer: Self-pay | Admitting: Family Medicine

## 2020-11-16 ENCOUNTER — Ambulatory Visit: Payer: Medicare Other

## 2020-11-16 DIAGNOSIS — Z1231 Encounter for screening mammogram for malignant neoplasm of breast: Secondary | ICD-10-CM

## 2020-11-20 ENCOUNTER — Telehealth: Payer: Self-pay

## 2020-11-20 NOTE — Chronic Care Management (AMB) (Addendum)
Chronic Care Management Pharmacy Assistant   Name: Jenna Young  MRN: [id number] DOB: 10-Aug-1931  Jenna Young is an 87 y.o. year old female who presents for his initial CCM visit with the clinical pharmacist.  Reason for Encounter: Initial Questions   Conditions to be addressed/monitored: HTN and HLD  Recent office visits:  08/24/20-PCP-Patient presented for follow up Covid and B12 Injection.UA ordered(abnormal) 07/20/20-Family Medicine-B12 Injection 06/15/20-Family Medicine-B12 Injection  Recent consult visits:  08/17/20-Optometry-no data found 05/23/20- Oncology-Patient presented for follow up iron deficiency.Labs ordered-Continue monthly B12 injections with primary care as needed.  Hospital visits:  08/01/20-[facility]-Patient presented for fatigue,HA,cough,Covid test- Instructed patient to self quarantine until the test results are back.  Discussed symptomatic treatment including Tylenol, rest, hydration.  Instructed patient to follow up with PCP.   Medications: Outpatient Encounter Medications as of 11/20/2020  Medication Sig   aspirin 81 MG EC tablet Take 81 mg by mouth daily. Swallow whole.   carvedilol (COREG) 3.125 MG tablet TAKE 1 TABLET(3.125 MG) BY MOUTH TWICE DAILY   Cholecalciferol (VITAMIN D) 2000 UNITS CAPS Take 1 capsule by mouth daily.   ciprofloxacin (CIPRO) 250 MG tablet Take 1 tablet (250 mg total) by mouth 2 (two) times daily.   clobetasol cream (TEMOVATE) [id number] % Apply 1 application topically daily as needed. To itchy areas   clopidogrel (PLAVIX) 75 MG tablet TAKE 1 TABLET(75 MG) BY MOUTH DAILY WITH BREAKFAST   cyanocobalamin (,VITAMIN B-12,) 1000 MCG/ML injection Inject 1,000 mcg into the muscle every 30 (thirty) days.   isosorbide mononitrate (IMDUR) 30 MG 24 hr tablet TAKE 1/2 TABLET(15 MG) BY MOUTH DAILY   losartan (COZAAR) 100 MG tablet Take 1 tablet (100 mg total) by mouth daily.   nitroGLYCERIN (NITROSTAT) 0.4 MG SL tablet PLACE 1  TABLET UNDER TONGUE EVERY 5 MIN AS NEEDED FOR CHEST PAIN IF NO RELIEF IN15 MIN CALL 911 (MAX 3 TABS)   omeprazole (PRILOSEC) 20 MG capsule TAKE 1 CAPSULE(20 MG) BY MOUTH DAILY AS NEEDED   rosuvastatin (CRESTOR) 10 MG tablet Take 1 tablet (10 mg total) by mouth daily.   triamcinolone (KENALOG) 0.1 % Apply 1 application topically 2 (two) times daily as needed.   No facility-administered encounter medications on file as of 11/20/2020.    Lab Results  Component Value Date/Time   HGBA1C 6.0 05/03/2020 10:44 AM   HGBA1C 6.2 04/14/2019 09:16 AM     BP Readings from Last 3 Encounters:  08/24/20 133/60  08/04/20 (!) 152/65  08/01/20 138/67    Patient contacted to review initial questions prior to visit with Debbora Dus.  Have you seen any other providers since your last visit with PCP? No  Any changes in your medications or health? No  Any side effects from any medications? No  Do you have an symptoms or problems not managed by your medications? No  Any concerns about your health right now? No  Has your provider asked that you check blood pressure, blood sugar, or follow special diet at home? No The patient reports she does have a BP monitor and will take her BP every now and then.  Do you get any type of exercise on a regular basis? Nothing formal, she has a son that does yard work for her but she will go to the grocery store and walk around   Can you think of a goal you would like to reach for your health? No  Do you have any problems getting your medications? No  [facility] has a drive through and she likes that.   Is there anything that you would like to discuss during the appointment? No   Jenna Young was reminded to have all medications, supplements and any blood glucose and blood pressure readings available for review with Debbora Dus, Pharm. D, at her telephone visit on 11/27/2020 at 2:00pm.    Star Rating Drugs:  Medication:  Last Fill: Day  Supply Losartan'100mg'$  09/10/20  90 Rosuvastatin '10mg'$  10/03/20 90   Care Gaps: Annual wellness visit in last year?   05/10/2020 Most Recent BP reading: 133/60 HR 82 (08/24/20)  Debbora Dus, CPP notified  Avel Sensor, [place] Assistant [phone]  I have reviewed the care management and care coordination activities outlined in this encounter and I am certifying that I agree with the content of this note. No further action required.  Debbora Dus, PharmD Clinical Pharmacist [facility] [phone]

## 2020-11-24 ENCOUNTER — Encounter: Payer: Self-pay | Admitting: Family Medicine

## 2020-11-27 ENCOUNTER — Ambulatory Visit (INDEPENDENT_AMBULATORY_CARE_PROVIDER_SITE_OTHER): Payer: Medicare Other

## 2020-11-27 ENCOUNTER — Other Ambulatory Visit: Payer: Self-pay

## 2020-11-27 DIAGNOSIS — E782 Mixed hyperlipidemia: Secondary | ICD-10-CM

## 2020-11-27 DIAGNOSIS — I10 Essential (primary) hypertension: Secondary | ICD-10-CM

## 2020-11-27 NOTE — Patient Instructions (Signed)
November 27, 2020  Dear Jenna Young,  It was a pleasure meeting you during our initial appointment on November 27, 2020. Below is a summary of the goals we discussed and components of chronic care management. Please contact me anytime with questions or concerns.   Visit Information  Patient Care Plan: CCM Pharmacy Care Plan     Problem Identified: CHL AMB "PATIENT-SPECIFIC PROBLEM"      Long-Range Goal: Disease Management   Start Date: 11/27/2020  Note:   Current Barriers:  None identified  Pharmacist Clinical Goal(s):  Patient will contact provider office for questions/concerns as evidenced notation of same in electronic health record through collaboration with PharmD and provider.   Interventions: 1:1 collaboration with Tower, [name], MD regarding development and update of comprehensive plan of care as evidenced by provider attestation and co-signature Inter-disciplinary care team collaboration (see longitudinal plan of care) Comprehensive medication review performed; medication list updated in electronic medical record  Hypertension (BP goal <140/90) -Controlled (per home reading today) -Current treatment: Losartan 100 mg - 1 tablet daily Carvedilol 3.125 mg - 1 tablet twice daily  Isosorbide Mononitrate 30 mg - 1/2 tablet daily -Medications previously tried: none  -Current home readings: 151/76 before meds, 113/61 after medicine - today (arm cuff) -Denies hypotensive/hypertensive symptoms. Denies falls. She feels dizzy at times. -Educated on BP goals and benefits of medications for prevention of heart attack, stroke and kidney damage; -Counseled to monitor BP at home monthly, document, and provide log at future appointments -Recommended to continue current medication; Encouraged increase water intake.  Hyperlipidemia/CAD: (LDL goal < 70) -Controlled - LDL 95 -Current treatment: Rosuvastatin 10 mg - 1 tablet daily (not taking - quit 5 months ago ~ April 2022,  muscle pain) Nitroglycerin 0.4 mg SL - 1 tablet daily (has not needed) Clopidogrel 75 mg - 1 tablet daily Aspirin 81 mg - 1 tablet daily  -Medications previously tried: simvastatin, atorvastatin, rosuvastatin - myalgias -Current exercise habits: none currently, she usually goes to class at the hospital. Not able to walk as easily as she used to.  -Educated on Cholesterol goals;  -Recommended to continue current medication; Set goal to check out exercise classes at the hospital. Consider trial once weekly dose of rosuvastatin.  Patient Goals/Self-Care Activities Patient will:  - take medications as prescribed  Follow Up Plan: Telephone follow up appointment with care management team member scheduled for:  6 months     Jenna Young was given information about Chronic Care Management services today including:  CCM service includes personalized support from designated clinical staff supervised by her physician, including individualized plan of care and coordination with other care providers 24/7 contact phone numbers for assistance for urgent and routine care needs. Standard insurance, coinsurance, copays and deductibles apply for chronic care management only during months in which we provide at least 20 minutes of these services. Most insurances cover these services at 100%, however patients may be responsible for any copay, coinsurance and/or deductible if applicable. This service may help you avoid the need for more expensive face-to-face services. Only one practitioner may furnish and bill the service in a calendar month. The patient may stop CCM services at any time (effective at the end of the month) by phone call to the office staff.  Patient agreed to services and verbal consent obtained.   Patient verbalizes understanding of instructions provided today and agrees to view in [place].   Debbora Dus, PharmD Clinical Pharmacist [facility] [phone]

## 2020-11-27 NOTE — Progress Notes (Signed)
Chronic Care Management Pharmacy Note  11/27/2020 Name:  Jenna Young     MRN:  [id number]  DOB:  June 06, 1931  Summary: Initial CCM visit. Reviewed all medications. Patient is not taking rosuvastatin. Unable to tolerate muscle pain. Stopped about 5 months ago. This is third statin she has failed. She will consider trial of once weekly rosuvastatin. No other medications concerns identified. Home BP within goal.  Recommendations/Changes made from today's visit: Patient set goal to increase activity level.  Plan: CCM follow up 6 months with Chrys Racer   Subjective: Jenna Young is an 87 y.o. year old female who is a primary patient of Tower, Audrie Gallus, MD.  The CCM team was consulted for assistance with disease management and care coordination needs.    Engaged with patient by telephone for initial visit in response to provider referral for pharmacy case management and/or care coordination services.   Consent to Services:  The patient was given the following information about Chronic Care Management services today, agreed to services, and gave verbal consent: 1. CCM service includes personalized support from designated clinical staff supervised by the primary care provider, including individualized plan of care and coordination with other care providers 2. 24/7 contact phone numbers for assistance for urgent and routine care needs. 3. Service will only be billed when office clinical staff spend 20 minutes or more in a month to coordinate care. 4. Only one practitioner may furnish and bill the service in a calendar month. 5.The patient may stop CCM services at any time (effective at the end of the month) by phone call to the office staff. 6. The patient will be responsible for cost sharing (co-pay) of up to 20% of the service fee (after annual deductible is met). Patient agreed to services and consent obtained.  Patient Care Team: Tower, Audrie Gallus, MD as PCP - General Mariah Milling,  Tollie Pizza, MD as PCP - Cardiology (Cardiology) Jeralyn Ruths, MD as Consulting Physician (Oncology) Phil Dopp, [facility] as Pharmacist (Pharmacist)  Recent office visits:  08/24/20 - PCP - Patient presented for follow up chronic health and COVID-19. Monthly B12 Injection. UA ordered. Ciprofloxacin 250 mg x 3 days. 07/20/20 - Family Medicine-B12 Injection 06/15/20 - Family Medicine-B12 Injection   Recent consult visits:  08/17/20 - Optometry - no data found 05/23/20 - Oncology - Patient presented for follow up iron deficiency. Labs ordered. Continue monthly B12 injections with primary care as needed.  Hospital visits: None in previous 6 months  Objective:  Lab Results  Component Value Date   CREATININE 0.83 05/03/2020   BUN 10 05/03/2020   GFR 63.88 05/03/2020   GFRNONAA >60 05/21/2019   GFRAA >60 05/21/2019   NA 133 (L) 05/03/2020   K 4.0 05/03/2020   CALCIUM 9.5 05/03/2020   CO2 28 05/03/2020   GLUCOSE 89 05/03/2020    Lab Results  Component Value Date/Time   HGBA1C 6.0 05/03/2020 10:44 AM   HGBA1C 6.2 04/14/2019 09:16 AM   GFR 63.88 05/03/2020 10:44 AM   GFR 61.84 04/14/2019 09:16 AM   Lab Results  Component Value Date   CHOL 184 05/03/2020   HDL 59.60 05/03/2020   LDLCALC 95 05/03/2020   LDLDIRECT 116.6 01/27/2013   TRIG 147.0 05/03/2020   CHOLHDL 3 05/03/2020    Hepatic Function Latest Ref Rng & Units 05/03/2020 05/21/2019 04/14/2019  Total Protein 6.0 - 8.3 g/dL 7.9 7.0(Y) 8.0  Albumin 3.5 - 5.2 g/dL 4.1 4.2 4.1  AST 0 - 37  U/L '13 26 16  '$ ALT 0 - 35 U/L $Remo'9 15 8  'oEQqn$ Alk Phosphatase 39 - 117 U/L 71 73 70  Total Bilirubin 0.2 - 1.2 mg/dL 0.5 0.8 0.5  Bilirubin, Direct 0.0 - 0.3 mg/dL - - -    Lab Results  Component Value Date/Time   TSH 2.10 05/03/2020 10:44 AM   TSH 2.25 04/14/2019 09:16 AM    CBC Latest Ref Rng & Units 05/23/2020 05/03/2020 01/18/2020  WBC 4.0 - 10.5 K/uL 7.3 6.7 6.8  Hemoglobin 12.0 - 15.0 g/dL 11.7(L) 11.8(L) 11.9(L)  Hematocrit 36.0 -  46.0 % 35.8(L) 35.8(L) 36.2  Platelets 150 - 400 K/uL 249 218.0 220    Lab Results  Component Value Date/Time   VD25OH 52.48 05/03/2020 10:44 AM   VD25OH 60.70 04/14/2019 09:16 AM    Clinical ASCVD: Yes  The ASCVD Risk score (Arnett DK, et al., 2019) failed to calculate for the following reasons:   The 2019 ASCVD risk score is only valid for ages 68 to 26   The patient has a prior MI or stroke diagnosis    Depression screen [facility] 2/9 05/09/2020 09/20/2019 06/21/2019  Decreased Interest 0 0 0  Down, Depressed, Hopeless 0 0 0  PHQ - 2 Score 0 0 0  Altered sleeping 0 0 0  Tired, decreased energy 0 1 1  Change in appetite 0 0 0  Feeling bad or failure about yourself  0 0 0  Trouble concentrating 0 0 0  Moving slowly or fidgety/restless 0 0 0  Suicidal thoughts 0 0 0  PHQ-9 Score 0 1 1  Difficult doing work/chores Not difficult at all Not difficult at all Somewhat difficult  Some recent data might be hidden    Social History   Tobacco Use  Smoking Status Former   Types: Cigarettes   Quit date: 03/11/1968   Years since quitting: 52.7  Smokeless Tobacco Never   BP Readings from Last 3 Encounters:  08/24/20 133/60  08/04/20 (!) 152/65  08/01/20 138/67   Pulse Readings from Last 3 Encounters:  08/24/20 82  08/04/20 64  08/01/20 87   Wt Readings from Last 3 Encounters:  08/24/20 129 lb (58.5 kg)  05/23/20 131 lb 12.8 oz (59.8 kg)  05/10/20 129 lb 6 oz (58.7 kg)   BMI Readings from Last 3 Encounters:  08/24/20 24.37 kg/m  05/23/20 24.90 kg/m  05/10/20 24.45 kg/m    Assessment/Interventions: Review of patient past medical history, allergies, medications, health status, including review of consultants reports, laboratory and other test data, was performed as part of comprehensive evaluation and provision of chronic care management services.   SDOH:  (Social Determinants of Health) assessments and interventions performed: Yes SDOH Interventions    Flowsheet Row Most  Recent Value  SDOH Interventions   Financial Strain Interventions Intervention Not Indicated      SDOH Screenings   Alcohol Screen: Low Risk    Last Alcohol Screening Score (AUDIT): 0  Depression (PHQ2-9): Low Risk    PHQ-2 Score: 0  Financial Resource Strain: Low Risk    Difficulty of Paying Living Expenses: Not very hard  Food Insecurity: No Food Insecurity   Worried About Charity fundraiser in the Last Year: Never true   Ran Out of Food in the Last Year: Never true  Housing: Low Risk    Last Housing Risk Score: 0  Physical Activity: Inactive   Days of Exercise per Week: 0 days   Minutes of Exercise per Session:  0 min  Social Connections: Not on file  Stress: No Stress Concern Present   Feeling of Stress : Not at all  Tobacco Use: Medium Risk   Smoking Tobacco Use: Former   Smokeless Tobacco Use: Never  Transportation Needs: No Transportation Needs   Lack of Transportation (Medical): No   Lack of Transportation (Non-Medical): No    CCM Care Plan  Allergies  Allergen Reactions   Buspirone Hcl     REACTION: HEART PALPITATIONS   Codeine     REACTION: nausea and vomiting   Hydrochlorothiazide     REACTION: syncope-possibly from dehydration   Iron     Oral iron -per pt made her "pass out"    Lansoprazole     REACTION: abd pain   Minocycline Hcl    Penicillins     REACTION: mouth numbness Has patient had a PCN reaction causing immediate rash, facial/tongue/throat swelling, SOB or lightheadedness with hypotension: No Has patient had a PCN reaction causing severe rash involving mucus membranes or skin necrosis: No Has patient had a PCN reaction that required hospitalization: No Has patient had a PCN reaction occurring within the last 10 years: No If all of the above answers are "NO", then may proceed with Cephalosporin use.   Phenergan [Promethazine Hcl] Other (See Comments)    Body shakes, hallucinations   Ramipril     Cough    Ranexa [Ranolazine]      Constipation    Sulfa Antibiotics Nausea Only   Rosuvastatin     Patient reports muscle pain, unable to take. Stopped around 07/22.    Medications Reviewed Today     Reviewed by Debbora Dus, [facility] (Pharmacist) on 11/27/20 at 1439  Med List Status: <None>   Medication Order Taking? Sig Documenting Provider Last Dose Status Informant  aspirin 81 MG EC tablet [id number] Yes Take 81 mg by mouth daily. Swallow whole. [provider] Taking Active Self  carvedilol (COREG) 3.125 MG tablet [id number] Yes TAKE 1 TABLET(3.125 MG) BY MOUTH TWICE DAILY Gollan, Kathlene November, MD Taking Active   Cholecalciferol (VITAMIN D) 2000 UNITS CAPS [id number] Yes Take 1 capsule by mouth daily. [provider] Taking Active Self  clobetasol cream (TEMOVATE) 0.05 % [id number]  Apply 1 application topically daily as needed. To itchy areas Tower, [name], MD  Active   clopidogrel (PLAVIX) 75 MG tablet [id number] Yes TAKE 1 TABLET(75 MG) BY MOUTH DAILY WITH BREAKFAST Tower, [name], MD Taking Active   cyanocobalamin (,VITAMIN B-12,) 1000 MCG/ML injection [id number] Yes Inject 1,000 mcg into the muscle every 30 (thirty) days. [provider] Taking Active Self  isosorbide mononitrate (IMDUR) 30 MG 24 hr tablet [id number] Yes TAKE 1/2 TABLET(15 MG) BY MOUTH DAILY Gollan, Kathlene November, MD Taking Active   losartan (COZAAR) 100 MG tablet [id number] Yes Take 1 tablet (100 mg total) by mouth daily. Venia Carbon, MD Taking Active   nitroGLYCERIN (NITROSTAT) 0.4 MG SL tablet [id number] Yes PLACE 1 TABLET UNDER TONGUE EVERY 5 MIN AS NEEDED FOR CHEST PAIN IF NO RELIEF IN15 MIN CALL 911 (MAX 3 TABS) Gollan, Kathlene November, MD Taking Active Self  omeprazole (PRILOSEC) 20 MG capsule [id number] Yes TAKE 1 CAPSULE(20 MG) BY MOUTH DAILY AS NEEDED Tower, [name], MD Taking Active   rosuvastatin (CRESTOR) 10 MG tablet [id number] No Take 1 tablet (10 mg total) by mouth daily.  Patient not taking: Reported on 11/27/2020   Minna Merritts, MD Not Taking Active   triamcinolone (KENALOG) 0.1 %  [id number] Yes Apply 1 application topically 2 (two) times daily as needed. Venia Carbon, MD Taking Active             Patient Active Problem List   Diagnosis Date Noted   Frequent urination 08/24/2020   History of COVID-19 08/03/2020   Dermatitis 05/03/2020   Secondary hypertension    Chest pain 05/21/2019   Iron deficiency anemia 05/12/2019   Prediabetes 04/15/2018   Encounter for screening mammogram for breast cancer 04/01/2017   H/O syncope 02/28/2017   Coronary artery disease of native artery of native heart with stable angina pectoris ([place]) 02/21/2017   History of non-ST elevation myocardial infarction (NSTEMI) 02/10/2017   History of breast cancer 02/26/2016   Varicose vein of leg 10/09/2015   Fatigue 10/09/2015   Pedal edema 10/09/2015   Heart murmur 10/09/2015   History of spinal fracture 05/07/2015   Dysthymia 09/20/2013   Encounter for Medicare annual wellness exam 02/03/2013   Vaginal itching 04/01/2012   Anemia, unspecified 01/17/2012   Vitamin D deficiency 08/19/2008   ANXIETY 01/29/2008   Essential hypertension, benign 09/25/2007   B12 deficiency 09/24/2006   Hyperlipidemia 09/24/2006   Venous (peripheral) insufficiency 09/24/2006   ALLERGIC RHINITIS 09/24/2006   GERD 09/24/2006   Osteoporosis 09/24/2006    Immunization History  Administered Date(s) Administered   Fluad Quad(high Dose 65+) 12/29/2018, 12/11/2019   Influenza Split 12/11/2010, 01/17/2012   Influenza Whole 01/09/2006, 12/10/2006, 12/08/2007, 12/13/2009   Influenza,inj,Quad PF,6+ Mos 12/09/2012, 12/15/2013, 12/22/2014, 12/21/2015, 12/24/2016, 01/14/2018   PFIZER(Purple Top)SARS-COV-2 Vaccination 04/05/2019, 04/26/2019, 01/12/2020   Pneumococcal Conjugate-13 02/23/2014   Pneumococcal Polysaccharide-23 12/11/2010   Td 01/10/2008    Conditions to be addressed/monitored:  Hypertension, Hyperlipidemia, and Coronary Artery  Disease  Care Plan : [place]  Updates made by Debbora Dus, [place] since 11/27/2020 12:00 AM     Problem: CHL AMB "PATIENT-SPECIFIC PROBLEM"      Long-Range Goal: Disease Management   Start Date: 11/27/2020  Note:   Current Barriers:  None identified  Pharmacist Clinical Goal(s):  Patient will contact provider office for questions/concerns as evidenced notation of same in electronic health record through collaboration with PharmD and provider.   Interventions: 1:1 collaboration with Tower, [name], MD regarding development and update of comprehensive plan of care as evidenced by provider attestation and co-signature Inter-disciplinary care team collaboration (see longitudinal plan of care) Comprehensive medication review performed; medication list updated in electronic medical record  Hypertension (BP goal <140/90) -Controlled (per home reading today) -Current treatment: Losartan 100 mg - 1 tablet daily Carvedilol 3.125 mg - 1 tablet twice daily  Isosorbide Mononitrate 30 mg - 1/2 tablet daily -Medications previously tried: none  -Current home readings: 151/76 before meds, 113/61 after medicine - today (arm cuff) -Denies hypotensive/hypertensive symptoms. Denies falls. She feels dizzy at times. -Educated on BP goals and benefits of medications for prevention of heart attack, stroke and kidney damage; -Counseled to monitor BP at home monthly, document, and provide log at future appointments -Recommended to continue current medication; Encouraged increase water intake.  Hyperlipidemia/CAD: (LDL goal < 70) -Controlled - LDL 95 -Current treatment: Rosuvastatin 10 mg - 1 tablet daily (not taking - quit 5 months ago ~ April 2022, muscle pain) Nitroglycerin 0.4 mg SL - 1 tablet daily (has not needed) Clopidogrel 75 mg - 1 tablet daily Aspirin 81 mg - 1 tablet daily  -Medications previously tried: simvastatin, atorvastatin, rosuvastatin - myalgias -Current exercise  habits: none currently, she usually goes to  class at the hospital. Not able to walk as easily as she used to.  -Educated on Cholesterol goals;  -Recommended to continue current medication; Set goal to check out exercise classes at the hospital. Consider trial once weekly dose of rosuvastatin.  Patient Goals/Self-Care Activities Patient will:  - take medications as prescribed  Follow Up Plan: Telephone follow up appointment with care management team member scheduled for:  6 months      Medication Assistance: None required.  Patient affirms current coverage meets needs.  Compliance/Adherence/Medication fill history: Care Gaps: Mammogram - scheduled for this Friday  Star Rating Drugs:  Medication:                Last Fill:         Day Supply Losartan100mg            09/10/20              90 Rosuvastatin 10mg      10/03/20            90  Patient's preferred pharmacy is:  [id number] [address], [address] [place] [address] Phone: [phone] Fax: [phone]  Uses pill box? No - she keeps them visible Pt endorses 95% compliance (rarely misses if she changes routine, goes out to breakfast, etc) They call her when meds are due for refill and she picks them up.  We discussed: Current pharmacy is preferred with insurance plan and patient is satisfied with pharmacy services Patient decided to: Continue current medication management strategy  Care Plan and Follow Up Patient Decision:  Patient agrees to Care Plan and Follow-up.  Debbora Dus, PharmD Clinical Pharmacist [facility] [facility] [phone]

## 2020-12-01 ENCOUNTER — Ambulatory Visit
Admission: RE | Admit: 2020-12-01 | Discharge: 2020-12-01 | Disposition: A | Discharge status: Home / Self Care | Payer: Medicare Other | Source: Ambulatory Visit | Attending: Family Medicine | Admitting: Family Medicine

## 2020-12-01 ENCOUNTER — Other Ambulatory Visit: Payer: Self-pay

## 2020-12-01 DIAGNOSIS — Z1231 Encounter for screening mammogram for malignant neoplasm of breast: Secondary | ICD-10-CM | POA: Diagnosis not present

## 2020-12-08 DIAGNOSIS — I10 Essential (primary) hypertension: Secondary | ICD-10-CM | POA: Diagnosis not present

## 2020-12-08 DIAGNOSIS — E782 Mixed hyperlipidemia: Secondary | ICD-10-CM | POA: Diagnosis not present

## 2020-12-13 ENCOUNTER — Other Ambulatory Visit: Payer: Self-pay

## 2020-12-13 ENCOUNTER — Ambulatory Visit (INDEPENDENT_AMBULATORY_CARE_PROVIDER_SITE_OTHER): Payer: Medicare Other

## 2020-12-13 ENCOUNTER — Ambulatory Visit: Payer: Medicare Other

## 2020-12-13 DIAGNOSIS — E538 Deficiency of other specified B group vitamins: Secondary | ICD-10-CM | POA: Diagnosis not present

## 2020-12-13 MED ORDER — CYANOCOBALAMIN 1000 MCG/ML IJ SOLN
1000.0000 ug | Freq: Once | INTRAMUSCULAR | Status: AC
  Administered 2020-12-13: 1000 ug via INTRAMUSCULAR

## 2020-12-13 NOTE — Progress Notes (Signed)
Per orders of Dr. Tower, injection of monthly B12 1000 mcg/ml given by Harjot Dibello P Marc Leichter, CMA in Left Deltoid. Patient tolerated injection well.  

## 2020-12-23 ENCOUNTER — Other Ambulatory Visit: Payer: Self-pay | Admitting: Cardiovascular Disease

## 2021-01-02 ENCOUNTER — Other Ambulatory Visit: Payer: Self-pay

## 2021-01-02 ENCOUNTER — Encounter: Payer: Self-pay | Admitting: Cardiovascular Disease

## 2021-01-02 ENCOUNTER — Ambulatory Visit (INDEPENDENT_AMBULATORY_CARE_PROVIDER_SITE_OTHER): Payer: Medicare Other | Admitting: Cardiovascular Disease

## 2021-01-02 VITALS — BP 160/60 | HR 66 | Ht 62.0 in | Wt 132.1 lb

## 2021-01-02 DIAGNOSIS — T466X5A Adverse effect of antihyperlipidemic and antiarteriosclerotic drugs, initial encounter: Secondary | ICD-10-CM

## 2021-01-02 DIAGNOSIS — I5022 Chronic systolic (congestive) heart failure: Secondary | ICD-10-CM

## 2021-01-02 DIAGNOSIS — I25118 Atherosclerotic heart disease of native coronary artery with other forms of angina pectoris: Secondary | ICD-10-CM

## 2021-01-02 DIAGNOSIS — D649 Anemia, unspecified: Secondary | ICD-10-CM

## 2021-01-02 DIAGNOSIS — T466X5D Adverse effect of antihyperlipidemic and antiarteriosclerotic drugs, subsequent encounter: Secondary | ICD-10-CM

## 2021-01-02 DIAGNOSIS — I10 Essential (primary) hypertension: Secondary | ICD-10-CM

## 2021-01-02 DIAGNOSIS — E785 Hyperlipidemia, unspecified: Secondary | ICD-10-CM

## 2021-01-02 DIAGNOSIS — M791 Myalgia, unspecified site: Secondary | ICD-10-CM | POA: Diagnosis not present

## 2021-01-02 NOTE — Progress Notes (Signed)
Cardiology Office Note  Date:  01/02/2021   ID:  Jenna Young, DOB November 11, 1931, MRN [id number]  PCP:  Abner Greenspan, MD   Chief Complaint  Patient presents with   12 month follow up     "Doing well." Medications reviewed by the patient verbally.     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 who follows up today for her hypertension, near syncope/syncope,  stable angina  LOV 12/2019  Lives alone, son lives nearby No chest pain Muscle tired in arms with overuse  "Can't walk much", legs feel weak Sedentary  No chest pain or SOB Sleeps well  Crestor Myalgias on Lipitor  Blood pressure at home typically 140-1 50 Higher on today's visit in the office  Denies any recent near-syncope or syncope Several episodes of near syncope/syncope in 2021  NSTEMI 05/2019 cardiac catheterization showing occluded RCA with collaterals from left to right  EKG personally reviewed by myself on todays visit Normal sinus rhythm rate 66 bpm left bundle branch block  Other past medical history reviewed Long hx of anemia, HGB 11 Iron infusions x2 Follows up with heme  Syncope 2020  on Saturday, was in the garden, 11 AM Reports that she was carrying some pot plants outside, started to feel dizzy Made her way inside but was dizzy, finally sat down inside Sister presents with her today reports that she had syncope per the son who was there with her Had bowel  incontinence Did not hurt herself  06/13/2019, reports that she had episode of syncope Ate some toast, then went shopping Maybe had some tea when she was out on the road Came back home, family was there, immediately started cooking Was in the kitchen when she felt weird, saw black spots, had to sit down Became  unresponsive on the couch Family later on the floor Could not find pulse Blood pressure 61/40s, pulse ? EMTs arrived, she came too  Chronic arm weakness started in March 2020 Stop the statin on her own symptoms did not get better Arm discomfort when moving the garbage can, raising her arms for long periods.  They feel tired  Prior episode of syncope while at the beauty parlor Had hair done, got up to a when she felt lightheaded and passed out  She did not fall or sustain any injuries from this event.   When EMS arrived patient's blood glucose was 137 and her blood pressure was 74/43 for which she received 500 cc of fluid.  In the hospital, TNT 2.48  started on heparin, medical management No cardiac catheterization performed as numbers were trending down and she was asymptomatic Etiology of the events unclear whether this was vasovagal, arrhythmia,  Lab work did not appear to be indicative of dehydration  hospital Admission for NSTEMI , 02/10/2017 D/c 02/13/2017  Cath Significant mid RCA disease with sequential, calcified 80 and 95% stenoses. Element of thrombus is likely also present. 90% ostial stenosis involving small OM1 branch. Mildly reduced left ventricular contraction with inferior hypokinesis (LVEF ~45%). Mildly elevated left ventricular filling pressure. Unsuccessful PCI to mid RCA due to inability to cross the mid RCA stenosis despite aggressive buddy wire support.  Recommendation : if she has unstable angina symptoms could consider PCI with atherectomy to mid RCA   Echo 02/2017  Systolic function was normal. The   estimated ejection fraction was in the range of 50% to 55%.   Hypokinesis of the inferior myocardium.  diagnosis of DCIS, had a biopsy bx Completed XRT   previously on amlodipine but wanted to change secondary to leg edema Started on bystolic Blood pressure continue to run high Started on HCTZ every other day  could not afford Bystolic long term   was  changed to clonidine.  Presented to the emergency room December 31 2015 with near syncope , dehydration  "Fell asleep in the sun " while she was on a swing set, was difficult to arouse, was taken inside by family, had large bowel movement   in the emergency room heart rate 62 bpm , sodium down to 129, creatinine elevated Notes indicate she was only taking clonidine once a day   PMH:   has a past medical history of Allergic rhinitis, Anemia, Anxiety, GERD (gastroesophageal reflux disease), History of radiation therapy (04/29/16- 05/24/16), Hyperlipidemia, Hypertension, Osteopenia, Osteoporosis, and Personal history of radiation therapy (2018).  PSH:    Past Surgical History:  Procedure Laterality Date   APPENDECTOMY     BREAST BIOPSY Left 11/29/2008   BREAST BIOPSY Left 01/26/2016   malignant   BREAST EXCISIONAL BIOPSY Left 12/26/2008   BREAST LUMPECTOMY Left 02/27/2016   BREAST LUMPECTOMY Left 04/01/2016   BREAST LUMPECTOMY WITH RADIOACTIVE SEED LOCALIZATION Left 02/27/2016   Procedure: LEFT BREAST LUMPECTOMY WITH RADIOACTIVE SEED LOCALIZATION;  Surgeon: Autumn Messing III, MD;  Location: [place] [facility];  Service: General;  Laterality: Left;   BREAST SURGERY  10/10   benign biopsy  negative   CESAREAN SECTION     CHOLECYSTECTOMY     CORONARY STENT INTERVENTION N/A 02/11/2017   Procedure: CORONARY STENT INTERVENTION;  Surgeon: Nelva Bush, MD;  Location: [facility] [facility];  Service: Cardiovascular;  Laterality: N/A;   FEMUR FRACTURE SURGERY Right 2006   LEFT HEART CATH AND CORONARY ANGIOGRAPHY N/A 02/11/2017   Procedure: LEFT HEART CATH AND CORONARY ANGIOGRAPHY;  Surgeon: Nelva Bush, MD;  Location: [place] [facility];  Service: Cardiovascular;  Laterality: N/A;   LEFT HEART CATH AND CORONARY ANGIOGRAPHY N/A 05/21/2019   Procedure: LEFT HEART CATH AND CORONARY ANGIOGRAPHY;  Surgeon: Wellington Hampshire, MD;  Location: [facility];  Service: Cardiovascular;   Laterality: N/A;   OVARIAN CYST SURGERY     RE-EXCISION OF BREAST LUMPECTOMY Left 04/01/2016   Procedure: RE-EXCISION OF LEFT BREAST MEDIAL MARGIN;  Surgeon: Autumn Messing III, MD;  Location: [facility];  Service: General;  Laterality: Left;    Current Outpatient Medications  Medication Sig Dispense Refill   aspirin 81 MG EC tablet Take 81 mg by mouth daily. Swallow whole.     carvedilol (COREG) 3.125 MG tablet TAKE 1 TABLET(3.125 MG) BY MOUTH TWICE DAILY 180 tablet 2   Cholecalciferol (VITAMIN D) 2000 UNITS CAPS Take 1 capsule by mouth daily.     clobetasol cream (TEMOVATE) 2.70 % Apply 1 application topically daily as needed. To itchy areas 30 g 0   clopidogrel (PLAVIX) 75 MG tablet TAKE 1 TABLET(75 MG) BY MOUTH DAILY WITH BREAKFAST 90 tablet 2   cyanocobalamin (,VITAMIN B-12,) 1000 MCG/ML injection Inject 1,000 mcg into the muscle every 30 (thirty) days.     isosorbide mononitrate (IMDUR) 30 MG 24 hr tablet TAKE 1/2 TABLET(15 MG) BY MOUTH DAILY 45 tablet 0   losartan (COZAAR) 100 MG tablet Take 1 tablet (100 mg total)  by mouth daily. 90 tablet 1   nitroGLYCERIN (NITROSTAT) 0.4 MG SL tablet PLACE 1 TABLET UNDER TONGUE EVERY 5 MIN AS NEEDED FOR CHEST PAIN IF NO RELIEF IN15 MIN CALL 911 (MAX 3 TABS) 25 tablet 1   omeprazole (PRILOSEC) 20 MG capsule TAKE 1 CAPSULE(20 MG) BY MOUTH DAILY AS NEEDED 90 capsule 2   rosuvastatin (CRESTOR) 10 MG tablet TAKE 1 TABLET(10 MG) BY MOUTH DAILY 90 tablet 0   triamcinolone (KENALOG) 0.1 % Apply 1 application topically 2 (two) times daily as needed. 45 g 1   No current facility-administered medications for this visit.     Allergies:   Buspirone hcl, Codeine, Hydrochlorothiazide, Iron, Lansoprazole, Minocycline hcl, Penicillins, Phenergan [promethazine hcl], Ramipril, Ranexa [ranolazine], Sulfa antibiotics, and Rosuvastatin   Social History:  The patient  reports that she quit smoking about 52 years ago. Her smoking use included cigarettes. She  has never used smokeless tobacco. She reports that she does not drink alcohol and does not use drugs.   Family History:   family history includes Cancer in her maternal aunt; Diabetes in her maternal aunt; Heart disease in her father; Hypertension in her mother; Macular degeneration in her sister.   Review of Systems  HENT: Negative.    Eyes: Negative.   Respiratory: Negative.    Cardiovascular: Negative.   Gastrointestinal: Negative.   Genitourinary: Negative.   Musculoskeletal: Negative.   Neurological: Negative.   Psychiatric/Behavioral: Negative.    All other systems reviewed and are negative.  PHYSICAL EXAM: VS:  BP (!) 160/60 (BP Location: Left Arm, Patient Position: Sitting, Cuff Size: Normal)   Pulse 66   Ht 5\' 2"  (1.575 m)   Wt 132 lb 2 oz (59.9 kg)   SpO2 98%   BMI 24.17 kg/m  , BMI Body mass index is 24.17 kg/m.  Constitutional:  oriented to person, place, and time. No distress.  HENT:  Head: Grossly normal Eyes:  no discharge. No scleral icterus.  Neck: No JVD, no carotid bruits  Cardiovascular: Regular rate and rhythm, no murmurs appreciated Pulmonary/Chest: Clear to auscultation bilaterally, no wheezes or rails Abdominal: Soft.  no distension.  no tenderness.  Musculoskeletal: Normal range of motion Neurological:  normal muscle tone. Coordination normal. No atrophy Skin: Skin warm and dry Psychiatric: normal affect, pleasant  Recent Labs: 05/03/2020: ALT 9; BUN 10; Creatinine, Ser 0.83; Potassium 4.0; Sodium 133; TSH 2.10 05/23/2020: Hemoglobin 11.7; Platelets 249    Lipid Panel Lab Results  Component Value Date   CHOL 184 05/03/2020   HDL 59.60 05/03/2020   LDLCALC 95 05/03/2020   TRIG 147.0 05/03/2020      Wt Readings from Last 3 Encounters:  01/02/21 132 lb 2 oz (59.9 kg)  08/24/20 129 lb (58.5 kg)  05/23/20 131 lb 12.8 oz (59.8 kg)      ASSESSMENT AND PLAN:  Coronary artery disease with stable angina catheterization with chronically  occluded RCA collaterals from left to right Currently with no symptoms of angina. No further workup at this time. Continue current medication regimen.  Ideally goal LDL less than 70  Syncope No further epsiodes Staying hydrated Blood pressure running high but he is nervous about recurrent episodes of syncope, would not be too aggressive with blood pressure Plan as below  Essential hypertension, benign -  Blood pressure mildly elevated at home she reports 140 to 150  Recommend she continue on half dose isosorbide with extra half dose for pressure over 150  Pure hypercholesterolemia Continue crestor 10 Myalgias  on Lipitor  Ductal carcinoma in situ (DCIS) of left breast Treatment is complete, radiation to the left    Total encounter time more than 25 minutes  Greater than 50% was spent in counseling and coordination of care with the patient    No orders of the defined types were placed in this encounter.   Signed, Esmond Plants, M.D., Ph.D. 01/02/2021  [place], [place]

## 2021-01-02 NOTE — Patient Instructions (Addendum)
Medication Instructions:  Isosorbide (IMDUR) Continue 1/2 dose isosorbide  May take extra 1/2 dose for pressure >150  If you need a refill on your cardiac medications before your next appointment, please call your pharmacy.   Lab work: No new labs needed  Testing/Procedures: No new testing needed  Follow-Up: At [facility], you and your health needs are our priority.  As part of our continuing mission to provide you with exceptional heart care, we have created designated Provider Care Teams.  These Care Teams include your primary Cardiologist (physician) and Advanced Practice Providers (APPs -  Physician Assistants and Nurse Practitioners) who all work together to provide you with the care you need, when you need it.  You will need a follow up appointment in 12 months  Providers on your designated Care Team:   Murray Hodgkins, NP Christell Faith, PA-C Cadence Kathlen Mody, [place]  COVID-19 Vaccine Information can be found at: [email or web address].uk For questions related to vaccine distribution or appointments, please email [email or web address] or call [phone].

## 2021-01-08 ENCOUNTER — Telehealth: Payer: Self-pay | Admitting: Cardiovascular Disease

## 2021-01-08 MED ORDER — LOSARTAN POTASSIUM 100 MG PO TABS: 100.0000 mg | ORAL_TABLET | Freq: Every day | ORAL | 1 refills | Status: DC

## 2021-01-08 NOTE — Telephone Encounter (Signed)
*  STAT* If patient is at the pharmacy, call can be transferred to refill team.   1. Which medications need to be refilled? (please list name of each medication and dose if known)   Losartan   2. Which pharmacy/location (including street and city if local pharmacy) is medication to be sent to? [facility] [address] by [employer]    3. Do they need a 30 day or 90 day supply? [place]

## 2021-01-08 NOTE — Telephone Encounter (Signed)
Requested Prescriptions   Signed Prescriptions Disp Refills   losartan (COZAAR) 100 MG tablet 90 tablet 1    Sig: Take 1 tablet (100 mg total) by mouth daily.    Authorizing Provider: Minna Merritts    Ordering User: Raelene Bott, Dontavian Marchi L

## 2021-01-09 DIAGNOSIS — H5203 Hypermetropia, bilateral: Secondary | ICD-10-CM | POA: Diagnosis not present

## 2021-01-09 DIAGNOSIS — H02055 Trichiasis without entropian left lower eyelid: Secondary | ICD-10-CM | POA: Diagnosis not present

## 2021-01-09 DIAGNOSIS — H52223 Regular astigmatism, bilateral: Secondary | ICD-10-CM | POA: Diagnosis not present

## 2021-01-09 DIAGNOSIS — Z961 Presence of intraocular lens: Secondary | ICD-10-CM | POA: Diagnosis not present

## 2021-01-09 DIAGNOSIS — H524 Presbyopia: Secondary | ICD-10-CM | POA: Diagnosis not present

## 2021-01-09 DIAGNOSIS — H16142 Punctate keratitis, left eye: Secondary | ICD-10-CM | POA: Diagnosis not present

## 2021-01-09 DIAGNOSIS — H43813 Vitreous degeneration, bilateral: Secondary | ICD-10-CM | POA: Diagnosis not present

## 2021-01-10 ENCOUNTER — Telehealth: Payer: Self-pay | Admitting: Cardiovascular Disease

## 2021-01-10 NOTE — Telephone Encounter (Signed)
Able to return call to Jenna Young, advised med list and Dr. Donivan Scull past notes has her on the 100 mg Losartan.   She reports she has two bottles of Losartan 25 mg & 100 mg, did not know which one she should be taking. Looks like dates back to earlier this year that she has been on the 100 mg Losartan form multiple office notes from different provider.  Pt took the 25 mg this am, advised can try 50 mg the next few days and steadily increase to 100 mg as she can't remember what dose she has been taking. She seen Dr. Rockey Situ on 10/25 and concern for HTN, advised if she has been taking only the 25 mg on some days could explain the HTN at times.   Advised to discard the 25 mg bottle for no more confusion and start the 100 mg with half pill then steadily increase to 100 mg. Monitor BP and HR as well as she is on other BP meds. If feels weak or dizzy then call back in and she may need lower dose.  Jenna Young thankful for returning her call, all concerns were address and no additional concerns at this time. Agreeable to plan, will call back for anything further.

## 2021-01-10 NOTE — Telephone Encounter (Signed)
Please call to discuss Losartan doseage. Patient is not sure she should be taking 100mg . Patient request a call after 1 pm

## 2021-01-12 ENCOUNTER — Ambulatory Visit (INDEPENDENT_AMBULATORY_CARE_PROVIDER_SITE_OTHER): Payer: Medicare Other

## 2021-01-12 ENCOUNTER — Other Ambulatory Visit: Payer: Self-pay

## 2021-01-12 DIAGNOSIS — Z23 Encounter for immunization: Secondary | ICD-10-CM

## 2021-01-16 ENCOUNTER — Other Ambulatory Visit: Payer: Self-pay

## 2021-01-16 ENCOUNTER — Ambulatory Visit (INDEPENDENT_AMBULATORY_CARE_PROVIDER_SITE_OTHER): Payer: Medicare Other

## 2021-01-16 DIAGNOSIS — E538 Deficiency of other specified B group vitamins: Secondary | ICD-10-CM

## 2021-01-16 MED ORDER — CYANOCOBALAMIN 1000 MCG/ML IJ SOLN
1000.0000 ug | Freq: Once | INTRAMUSCULAR | Status: AC
  Administered 2021-01-16: 1000 ug via INTRAMUSCULAR

## 2021-01-16 NOTE — Progress Notes (Signed)
Per orders of Dr. Glori Bickers, injection of monthly B12 1000 mcg/ml given by Pilar Grammes, CMA in Left Deltoid at pt request. Got her flu shot in the right arm a few days ago. Patient tolerated injection well.

## 2021-02-08 ENCOUNTER — Telehealth: Payer: Self-pay

## 2021-02-08 NOTE — Chronic Care Management (AMB) (Signed)
Chronic Care Management Pharmacy Assistant   Name: Jenna Young  MRN: [id number] DOB: 1931-09-30   Reason for Encounter: Hypertension Disease State    Recent office visits:  01/16/21-[facility]- CMA- B12 injection  12/13/20-[facility]- CMA-B12 injection  Recent consult visits:  01/02/21-Cardiology-Timothy Gollan,MD-Patient presented for follow up coronary artery disease.Previous labs reviewed,Recommend she continue on half dose isosorbide with extra half dose for pressure over [place] [facility] visits:  None in previous 6 months  Medications: Outpatient Encounter Medications as of 02/08/2021  Medication Sig   aspirin 81 MG EC tablet Take 81 mg by mouth daily. Swallow whole.   carvedilol (COREG) 3.125 MG tablet TAKE 1 TABLET(3.125 MG) BY MOUTH TWICE DAILY   Cholecalciferol (VITAMIN D) 2000 UNITS CAPS Take 1 capsule by mouth daily.   clobetasol cream (TEMOVATE) 1.32 % Apply 1 application topically daily as needed. To itchy areas   clopidogrel (PLAVIX) 75 MG tablet TAKE 1 TABLET(75 MG) BY MOUTH DAILY WITH BREAKFAST   cyanocobalamin (,VITAMIN B-12,) 1000 MCG/ML injection Inject 1,000 mcg into the muscle every 30 (thirty) days.   isosorbide mononitrate (IMDUR) 30 MG 24 hr tablet TAKE 1/2 TABLET(15 MG) BY MOUTH DAILY   losartan (COZAAR) 100 MG tablet Take 1 tablet (100 mg total) by mouth daily.   nitroGLYCERIN (NITROSTAT) 0.4 MG SL tablet PLACE 1 TABLET UNDER TONGUE EVERY 5 MIN AS NEEDED FOR CHEST PAIN IF NO RELIEF IN15 MIN CALL 911 (MAX 3 TABS)   omeprazole (PRILOSEC) 20 MG capsule TAKE 1 CAPSULE(20 MG) BY MOUTH DAILY AS NEEDED   rosuvastatin (CRESTOR) 10 MG tablet TAKE 1 TABLET(10 MG) BY MOUTH DAILY   triamcinolone (KENALOG) 0.1 % Apply 1 application topically 2 (two) times daily as needed.   No facility-administered encounter medications on file as of 02/08/2021.    Recent Office Vitals: BP Readings from Last 3 Encounters:  01/02/21 (!) 160/60  08/24/20 133/60  08/04/20 (!) 152/65    Pulse Readings from Last 3 Encounters:  01/02/21 66  08/24/20 82  08/04/20 64    Wt Readings from Last 3 Encounters:  01/02/21 132 lb 2 oz (59.9 kg)  08/24/20 129 lb (58.5 kg)  05/23/20 131 lb 12.8 oz (59.8 kg)     Kidney Function Lab Results  Component Value Date/Time   CREATININE 0.83 05/03/2020 10:44 AM   CREATININE 0.81 05/21/2019 07:03 AM   CREATININE 0.95 10/22/2012 07:14 PM   GFR 63.88 05/03/2020 10:44 AM   GFRNONAA >60 05/21/2019 07:03 AM   GFRNONAA 57 (L) 10/22/2012 07:14 PM   GFRAA >60 05/21/2019 07:03 AM   GFRAA >60 10/22/2012 07:14 PM    BMP Latest Ref Rng & Units 05/03/2020 05/21/2019 05/21/2019  Glucose 70 - 99 mg/dL 89 111(H) 115(H)  BUN 6 - 23 mg/dL 10 6(L) 7(L)  Creatinine 0.40 - 1.20 mg/dL 0.83 0.81 0.87  Sodium 135 - 145 mEq/L 133(L) 136 134(L)  Potassium 3.5 - 5.1 mEq/L 4.0 4.7 3.5  Chloride 96 - 112 mEq/L 99 105 102  CO2 19 - 32 mEq/L 28 23 22   Calcium 8.4 - 10.5 mg/dL 9.5 8.9 9.3     Contacted patient on 02/13/21 to discuss hypertension disease state  Current antihypertensive regimen:  Losartan 100 mg - 1 tablet daily Carvedilol 3.125 mg - 1 tablet twice daily  Isosorbide Mononitrate 30 mg - 1/2 tablet daily   Patient verbally confirms she is taking the above medications as directed. Yes Actually the patient states the losartan is a large pill and she  has difficulty with swallowing and has been cutting pill in half. The patient also states she is only using 1/2 tablet of Isosorbide Mononitrate 30mg  daily .  How often are you checking your Blood Pressure? daily  she checks her blood pressure in the morning before taking her medication.  Current home BP readings:  02/13/21-  patient states  170/70  mid-day reading.Prior  to today the patient reports readings in normal limits. Asked to record over the next few days and I will call back to report readings to CPP.   Wrist or arm cuff: arm cuff Caffeine intake: drinks 1 cup of decaf,  Salt intake:  limits adding to foods OTC medications including pseudoephedrine or NSAIDs?   Not at this time, she cant find tylenol tablets anywhere.  Any readings above 180/120? No  What recent interventions/DTPs have been made by any provider to improve Blood Pressure control since last CPP Visit:  02/13/21 The patient reports she has been cutting the Losartan in half due to not being able to swallow the whole pill.  Any recent hospitalizations or ED visits since last visit with CPP? No  What diet changes have been made to improve Blood Pressure Control?   The patient is using low sodium  What exercise is being done to improve your Blood Pressure Control?      None Identified    Adherence Review: Is the patient currently on ACE/ARB medication? Yes Does the patient have >5 day gap between last estimated fill dates? No   Star Rating Drugs:  Medication:  Last Fill: Day Supply Losartan 100mg  01/08/21 90 Rosuvastatin 10mg  12/25/20 90    Care Gaps: Annual wellness visit in last year? No Most Recent BP reading:160/60 66-P  01/02/21   No appointments scheduled within the next 30 days.  02/15/21- B12 shot   Marjo Bicker, CPP notified  Avel Sensor, [place] Assistant [phone]  Total time spent for month CPA: 30 min.

## 2021-02-15 ENCOUNTER — Ambulatory Visit (INDEPENDENT_AMBULATORY_CARE_PROVIDER_SITE_OTHER): Payer: Medicare Other

## 2021-02-15 ENCOUNTER — Other Ambulatory Visit: Payer: Self-pay

## 2021-02-15 DIAGNOSIS — E538 Deficiency of other specified B group vitamins: Secondary | ICD-10-CM

## 2021-02-15 MED ORDER — CYANOCOBALAMIN 1000 MCG/ML IJ SOLN
1000.0000 ug | Freq: Once | INTRAMUSCULAR | Status: AC
  Administered 2021-02-15: 1000 ug via INTRAMUSCULAR

## 2021-02-15 NOTE — Progress Notes (Signed)
Per orders of Dr. Glori Bickers, injection of monthly B12 1000 mcg/ml injection given by Pilar Grammes, CMA in Right Deltoid. Patient tolerated injection well.

## 2021-02-21 ENCOUNTER — Ambulatory Visit: Payer: Medicare Other

## 2021-03-01 ENCOUNTER — Ambulatory Visit: Payer: Self-pay | Admitting: Pharmacist

## 2021-03-01 DIAGNOSIS — I25118 Atherosclerotic heart disease of native coronary artery with other forms of angina pectoris: Secondary | ICD-10-CM

## 2021-03-01 DIAGNOSIS — I10 Essential (primary) hypertension: Secondary | ICD-10-CM

## 2021-03-01 NOTE — Progress Notes (Signed)
Called patient for BP log -The patient reports different times of the day she took her BP.  145/64 148/69 137/62 129/68 138/69   Jenna Young, CPP notified  Avel Sensor, [place] Assistant [phone]  Total time spent for month CPA: 10 min.

## 2021-03-01 NOTE — Progress Notes (Signed)
Updated blood pressure in chart with patient-reported home BP:  BP Readings from Last 3 Encounters:  03/01/21 137/62  01/02/21 (!) 160/60  08/24/20 133/60    Care Plan : [place]  Updates made by Charlton Haws, [place] since 03/01/2021 12:00 AM     Problem: CHL AMB "PATIENT-SPECIFIC PROBLEM"      Long-Range Goal: Disease Management   Start Date: 11/27/2020  Note:   Current Barriers:  Unable to tolerate rosuvastatin  Pharmacist Clinical Goal(s):  Patient will contact provider office for questions/concerns as evidenced notation of same in electronic health record through collaboration with PharmD and provider.   Interventions: 1:1 collaboration with [place], [name], MD regarding development and update of comprehensive plan of care as evidenced by provider attestation and co-signature Inter-disciplinary care team collaboration (see longitudinal plan of care) Comprehensive medication review performed; medication list updated in electronic medical record  Hypertension (BP goal <140/90) -Controlled (per home reading today): 137/62 -Current treatment: Losartan 100 mg - 1 tablet daily Carvedilol 3.125 mg - 1 tablet twice daily  Isosorbide Mononitrate 30 mg - 1/2 tablet daily -Medications previously tried: none  -Current home readings: 151/76 before meds, 113/61 after medicine - today (arm cuff) -Denies hypotensive/hypertensive symptoms. Denies falls. She feels dizzy at times. -Educated on BP goals and benefits of medications for prevention of heart attack, stroke and kidney damage; -Counseled to monitor BP at home monthly, document, and provide log at future appointments -Recommended to continue current medication; Encouraged increase water intake.  Hyperlipidemia/CAD: (LDL goal < 70) -Controlled - LDL 95 -Current treatment: Rosuvastatin 10 mg - 1 tablet daily (not taking - quit 5 months ago ~ April 2022, muscle pain) Nitroglycerin 0.4 mg SL - 1 tablet daily (has  not needed) Clopidogrel 75 mg - 1 tablet daily Aspirin 81 mg - 1 tablet daily  -Medications previously tried: simvastatin, atorvastatin, rosuvastatin - myalgias -Current exercise habits: none currently, she usually goes to class at the hospital. Not able to walk as easily as she used to.  -Educated on Cholesterol goals;  -Recommended to continue current medication; Set goal to check out exercise classes at the hospital. Consider trial once weekly dose of rosuvastatin.  Patient Goals/Self-Care Activities Patient will:  - take medications as prescribed  Follow Up Plan: Telephone follow up appointment with care management team member scheduled for:  6 months     Charlton Haws, [facility]

## 2021-03-14 ENCOUNTER — Ambulatory Visit (INDEPENDENT_AMBULATORY_CARE_PROVIDER_SITE_OTHER): Payer: Medicare Other | Admitting: Family

## 2021-03-14 ENCOUNTER — Encounter: Payer: Self-pay | Admitting: Family

## 2021-03-14 ENCOUNTER — Other Ambulatory Visit: Payer: Self-pay

## 2021-03-14 VITALS — BP 134/72 | HR 70 | Temp 96.8°F | Ht 62.0 in | Wt 134.0 lb

## 2021-03-14 DIAGNOSIS — R350 Frequency of micturition: Secondary | ICD-10-CM | POA: Diagnosis not present

## 2021-03-14 DIAGNOSIS — K130 Diseases of lips: Secondary | ICD-10-CM

## 2021-03-14 DIAGNOSIS — N898 Other specified noninflammatory disorders of vagina: Secondary | ICD-10-CM | POA: Diagnosis not present

## 2021-03-14 MED ORDER — CLOTRIMAZOLE 1 % EX CREA: 1.0000 "application " | TOPICAL_CREAM | Freq: Two times a day (BID) | CUTANEOUS | 0 refills | Status: DC

## 2021-03-14 MED ORDER — CLOTRIMAZOLE-BETAMETHASONE 1-0.05 % EX CREA: 1.0000 "application " | TOPICAL_CREAM | Freq: Two times a day (BID) | CUTANEOUS | 0 refills | Status: AC

## 2021-03-14 NOTE — Patient Instructions (Signed)
I have sent a cream to the pharmacy for you to start using twice daily for up to two weeks.  I have ordered a urine sample as well to rule out other etiologies, I will let you know once the results are received.   It was a pleasure seeing you today! Please do not hesitate to reach out with any questions and or concerns.  Regards,   Eugenia Pancoast FNP-C

## 2021-03-14 NOTE — Progress Notes (Signed)
Established Patient Office Visit  Subjective:  Patient ID: Jenna Young, female    DOB: [birth year]  Age: 88 y.o. MRN: [id number]  CC:  Chief Complaint  Patient presents with   Vaginal Itching    HPI Nayra Coury is here today with c/o vaginal itching that is about to 'drive her crazy'. She is using over the counter eucerin cream that is otc which is helping some. Symptoms have been on and off for the last two weeks. No vaginal discharge. She does have to wear a pad due to urinary incontinence and she thinks maybe she is allergic to the pads she stopped wearing the pads about three days ago  but only mild relief. There is a red itchy rash bil groin.   She does have urinary frequency, and incontinence but this is normal for her.   Past Medical History:  Diagnosis Date   Allergic rhinitis    Anemia    Anxiety    GERD (gastroesophageal reflux disease)    History of radiation therapy 04/29/16- 05/24/16   Left Breast 40.05 Gy in 15 fractions, Left Breast boost 10 Gy in 5 fractions.    Hyperlipidemia    Hypertension    treated by Dr Thurnell Garbe recently with syncope episode   Osteopenia    Osteoporosis    Personal history of radiation therapy 2018    Past Surgical History:  Procedure Laterality Date   APPENDECTOMY     BREAST BIOPSY Left 11/29/2008   BREAST BIOPSY Left 01/26/2016   malignant   BREAST EXCISIONAL BIOPSY Left 12/26/2008   BREAST LUMPECTOMY Left 02/27/2016   BREAST LUMPECTOMY Left 04/01/2016   BREAST LUMPECTOMY WITH RADIOACTIVE SEED LOCALIZATION Left 02/27/2016   Procedure: LEFT BREAST LUMPECTOMY WITH RADIOACTIVE SEED LOCALIZATION;  Surgeon: Autumn Messing III, MD;  Location: [facility];  Service: General;  Laterality: Left;   BREAST SURGERY  10/10   benign biopsy  negative   CESAREAN SECTION     CHOLECYSTECTOMY     CORONARY STENT INTERVENTION N/A 02/11/2017   Procedure: CORONARY STENT INTERVENTION;  Surgeon: Nelva Bush, MD;  Location: [facility];  Service: Cardiovascular;  Laterality: N/A;   FEMUR FRACTURE SURGERY Right 2006   LEFT HEART CATH AND CORONARY ANGIOGRAPHY N/A 02/11/2017   Procedure: LEFT HEART CATH AND CORONARY ANGIOGRAPHY;  Surgeon: Nelva Bush, MD;  Location: [facility];  Service: Cardiovascular;  Laterality: N/A;   LEFT HEART CATH AND CORONARY ANGIOGRAPHY N/A 05/21/2019   Procedure: LEFT HEART CATH AND CORONARY ANGIOGRAPHY;  Surgeon: Wellington Hampshire, MD;  Location: [facility];  Service: Cardiovascular;  Laterality: N/A;   OVARIAN CYST SURGERY     RE-EXCISION OF BREAST LUMPECTOMY Left 04/01/2016   Procedure: RE-EXCISION OF LEFT BREAST MEDIAL MARGIN;  Surgeon: Autumn Messing III, MD;  Location: [facility];  Service: General;  Laterality: Left;    Family History  Problem Relation Age of Onset   Hypertension Mother    Heart disease Father    Macular degeneration Sister    Diabetes Maternal Aunt    Cancer Maternal Aunt        throat    Social History   Socioeconomic History   Marital status: Widowed    Spouse name: Not on file   Number of children: 1   Years of education: Not on file   Highest education level: Not on file  Occupational History    Employer: RETIRED  Tobacco Use  Smoking status: Former    Types: Cigarettes    Quit date: 03/11/1968    Years since quitting: 53.0   Smokeless tobacco: Never  Vaping Use   Vaping Use: Never used  Substance and Sexual Activity   Alcohol use: No    Alcohol/week: 0.0 standard drinks   Drug use: No   Sexual activity: Not Currently  Other Topics Concern   Not on file  Social History Narrative   Not on file   Social Determinants of Health   Financial Resource Strain: Low Risk    Difficulty of Paying Living Expenses: Not very hard  Food Insecurity: No Food Insecurity   Worried About Charity fundraiser in the Last Year: Never true   Ran Out of Food in  the Last Year: Never true  Transportation Needs: No Transportation Needs   Lack of Transportation (Medical): No   Lack of Transportation (Non-Medical): No  Physical Activity: Inactive   Days of Exercise per Week: 0 days   Minutes of Exercise per Session: 0 min  Stress: No Stress Concern Present   Feeling of Stress : Not at all  Social Connections: Not on file  Intimate Partner Violence: Not At Risk   Fear of Current or Ex-Partner: No   Emotionally Abused: No   Physically Abused: No   Sexually Abused: No    Outpatient Medications Prior to Visit  Medication Sig Dispense Refill   aspirin 81 MG EC tablet Take 81 mg by mouth daily. Swallow whole.     carvedilol (COREG) 3.125 MG tablet TAKE 1 TABLET(3.125 MG) BY MOUTH TWICE DAILY 180 tablet 2   Cholecalciferol (VITAMIN D) 2000 UNITS CAPS Take 1 capsule by mouth daily.     clobetasol cream (TEMOVATE) 7.67 % Apply 1 application topically daily as needed. To itchy areas 30 g 0   clopidogrel (PLAVIX) 75 MG tablet TAKE 1 TABLET(75 MG) BY MOUTH DAILY WITH BREAKFAST 90 tablet 2   cyanocobalamin (,VITAMIN B-12,) 1000 MCG/ML injection Inject 1,000 mcg into the muscle every 30 (thirty) days.     isosorbide mononitrate (IMDUR) 30 MG 24 hr tablet TAKE 1/2 TABLET(15 MG) BY MOUTH DAILY 45 tablet 0   losartan (COZAAR) 100 MG tablet Take 1 tablet (100 mg total) by mouth daily. 90 tablet 1   nitroGLYCERIN (NITROSTAT) 0.4 MG SL tablet PLACE 1 TABLET UNDER TONGUE EVERY 5 MIN AS NEEDED FOR CHEST PAIN IF NO RELIEF IN15 MIN CALL 911 (MAX 3 TABS) 25 tablet 1   omeprazole (PRILOSEC) 20 MG capsule TAKE 1 CAPSULE(20 MG) BY MOUTH DAILY AS NEEDED 90 capsule 2   rosuvastatin (CRESTOR) 10 MG tablet TAKE 1 TABLET(10 MG) BY MOUTH DAILY 90 tablet 0   triamcinolone (KENALOG) 0.1 % Apply 1 application topically 2 (two) times daily as needed. 45 g 1   No facility-administered medications prior to visit.    Allergies  Allergen Reactions   Buspirone  Hcl     REACTION: HEART PALPITATIONS   Codeine     REACTION: nausea and vomiting   Hydrochlorothiazide     REACTION: syncope-possibly from dehydration   Iron     Oral iron -per pt made her "pass out"    Lansoprazole     REACTION: abd pain   Minocycline Hcl    Penicillins     REACTION: mouth numbness Has patient had a PCN reaction causing immediate rash, facial/tongue/throat swelling, SOB or lightheadedness with hypotension: No Has patient had a PCN reaction causing severe rash involving mucus  membranes or skin necrosis: No Has patient had a PCN reaction that required hospitalization: No Has patient had a PCN reaction occurring within the last 10 years: No If all of the above answers are "NO", then may proceed with Cephalosporin use.   Phenergan [Promethazine Hcl] Other (See Comments)    Body shakes, hallucinations   Ramipril     Cough    Ranexa [Ranolazine]     Constipation    Sulfa Antibiotics Nausea Only   Rosuvastatin     Patient reports muscle pain, unable to take. Stopped around 07/22.    ROS Review of Systems  Constitutional:  Negative for chills, fatigue and fever.  Genitourinary:  Positive for frequency. Negative for difficulty urinating, urgency, vaginal bleeding, vaginal discharge and vaginal pain.       Vaginal itching on outer aspects/ bil labias as well as within bil groin     Objective:    Physical Exam Genitourinary:    General: Normal vulva.     Pubic Area: Rash present.     Labia:        Right: Rash present. No tenderness.        Left: No tenderness.       BP 134/72    Pulse 70    Temp (!) 96.8 F (36 C) (Temporal)    Ht 5\' 2"  (1.575 m)    Wt 134 lb (60.8 kg)    SpO2 96%    BMI 24.51 kg/m  Wt Readings from Last 3 Encounters:  03/14/21 134 lb (60.8 kg)  01/02/21 132 lb 2 oz (59.9 kg)  08/24/20 129 lb (58.5 kg)     Health Maintenance Due  Topic Date Due   Zoster Vaccines- Shingrix (1 of 2) Never done   COVID-19 Vaccine (4 -  Booster for Pfizer series) 03/08/2020    There are no preventive care reminders to display for this patient.  Lab Results  Component Value Date   TSH 2.10 05/03/2020   Lab Results  Component Value Date   WBC 7.3 05/23/2020   HGB 11.7 (L) 05/23/2020   HCT 35.8 (L) 05/23/2020   MCV 84.6 05/23/2020   PLT 249 05/23/2020   Lab Results  Component Value Date   NA 133 (L) 05/03/2020   K 4.0 05/03/2020   CO2 28 05/03/2020   GLUCOSE 89 05/03/2020   BUN 10 05/03/2020   CREATININE 0.83 05/03/2020   BILITOT 0.5 05/03/2020   ALKPHOS 71 05/03/2020   AST 13 05/03/2020   ALT 9 05/03/2020   PROT 7.9 05/03/2020   ALBUMIN 4.1 05/03/2020   CALCIUM 9.5 05/03/2020   ANIONGAP 8 05/21/2019   GFR 63.88 05/03/2020   Lab Results  Component Value Date   HGBA1C 6.0 05/03/2020      Assessment & Plan:   Problem List Items Addressed This Visit       Digestive   Intertrigo labialis    rx given for itching as well as rash. Directions to apply lightly to area and not internally as this is steroid. Can apply up to 10 days. If no improvement, may consider referral to GYN although suspected candida. Urine ancillary ordere to r/o vaginal candida as well and also urine c/s pending.        Musculoskeletal and Integument   Vaginal itching - Primary    Urine ordered, to also r/o candida infection vaginally. Pending results. rx given for itching/rash.      Relevant Medications   clotrimazole-betamethasone (LOTRISONE) cream  Other Relevant Orders   Urine cytology ancillary only([place])   Urinalysis w microscopic + reflex cultur (Completed)     Other   Frequent urination    U/a ordered, pending culture and results.       Urinary frequency   Relevant Orders   Urine cytology ancillary only([place])   Urinalysis w microscopic + reflex cultur (Completed)    Meds ordered this encounter  Medications   DISCONTD: clotrimazole (LOTRIMIN) 1 % cream    Sig: Apply 1 application topically  2 (two) times daily for 14 days.    Dispense:  28 g    Refill:  0    Order Specific Question:   Supervising Provider    Answer:   BEDSOLE, AMY E [[id number]]   clotrimazole-betamethasone (LOTRISONE) cream    Sig: Apply 1 application topically 2 (two) times daily for 14 days.    Dispense:  28 g    Refill:  0    Please disregard clotrimazole cream RX and give this instead.    Order Specific Question:   Supervising Provider    Answer:   Diona Browner, AMY E [[id number]]    Follow-up: No follow-ups on file.    Eugenia Pancoast, FNP

## 2021-03-15 DIAGNOSIS — R350 Frequency of micturition: Secondary | ICD-10-CM | POA: Insufficient documentation

## 2021-03-15 DIAGNOSIS — K130 Diseases of lips: Secondary | ICD-10-CM | POA: Insufficient documentation

## 2021-03-15 NOTE — Assessment & Plan Note (Signed)
rx given for itching as well as rash. Directions to apply lightly to area and not internally as this is steroid. Can apply up to 10 days. If no improvement, may consider referral to GYN although suspected candida. Urine ancillary ordere to r/o vaginal candida as well and also urine c/s pending.

## 2021-03-15 NOTE — Assessment & Plan Note (Signed)
Urine ordered, to also r/o candida infection vaginally. Pending results. rx given for itching/rash.

## 2021-03-15 NOTE — Assessment & Plan Note (Signed)
U/a ordered, pending culture and results.

## 2021-03-16 LAB — URINALYSIS W MICROSCOPIC + REFLEX CULTURE
Bacteria, UA: NONE SEEN /HPF
Bilirubin Urine: NEGATIVE
Glucose, UA: NEGATIVE
Hgb urine dipstick: NEGATIVE
Hyaline Cast: NONE SEEN /LPF
Ketones, ur: NEGATIVE
Nitrites, Initial: NEGATIVE
Protein, ur: NEGATIVE
RBC / HPF: NONE SEEN /HPF (ref 0–2)
Specific Gravity, Urine: 1.011 (ref 1.001–1.035)
pH: 7 (ref 5.0–8.0)

## 2021-03-16 LAB — URINE CULTURE
MICRO NUMBER:: 12826883
SPECIMEN QUALITY:: ADEQUATE

## 2021-03-16 LAB — CULTURE INDICATED

## 2021-03-21 ENCOUNTER — Ambulatory Visit (INDEPENDENT_AMBULATORY_CARE_PROVIDER_SITE_OTHER): Payer: Medicare Other

## 2021-03-21 ENCOUNTER — Other Ambulatory Visit: Payer: Self-pay

## 2021-03-21 DIAGNOSIS — E538 Deficiency of other specified B group vitamins: Secondary | ICD-10-CM | POA: Diagnosis not present

## 2021-03-21 MED ORDER — CYANOCOBALAMIN 1000 MCG/ML IJ SOLN
1000.0000 ug | Freq: Once | INTRAMUSCULAR | Status: AC
  Administered 2021-03-21: 1000 ug via INTRAMUSCULAR

## 2021-03-21 NOTE — Progress Notes (Signed)
Per orders of Dr. Tower,monthly injection of B12 given by Rayfield Beem G Riely Baskett. Patient tolerated injection well.  

## 2021-03-23 ENCOUNTER — Telehealth: Payer: Self-pay | Admitting: Family Medicine

## 2021-03-23 ENCOUNTER — Other Ambulatory Visit: Payer: Self-pay | Admitting: Cardiovascular Disease

## 2021-03-24 NOTE — Telephone Encounter (Signed)
Sending Plavix for review to Dr Claudius Sis filled on5/31/22 #90 with 2 refills. LOV 03/14/21 vaginal itching  Sending note to the front to schedule patient for CPE anytime after 05/10/21 with Dr Glori Bickers please.

## 2021-03-26 NOTE — Telephone Encounter (Signed)
Called mrs. Campoy and got her scheduled for 3/3 for labs and 3/10 for CPE

## 2021-03-27 DIAGNOSIS — H5203 Hypermetropia, bilateral: Secondary | ICD-10-CM | POA: Diagnosis not present

## 2021-03-27 DIAGNOSIS — Z961 Presence of intraocular lens: Secondary | ICD-10-CM | POA: Diagnosis not present

## 2021-03-27 DIAGNOSIS — H43813 Vitreous degeneration, bilateral: Secondary | ICD-10-CM | POA: Diagnosis not present

## 2021-03-27 DIAGNOSIS — H52223 Regular astigmatism, bilateral: Secondary | ICD-10-CM | POA: Diagnosis not present

## 2021-03-27 DIAGNOSIS — H02055 Trichiasis without entropian left lower eyelid: Secondary | ICD-10-CM | POA: Diagnosis not present

## 2021-03-27 DIAGNOSIS — H524 Presbyopia: Secondary | ICD-10-CM | POA: Diagnosis not present

## 2021-03-27 DIAGNOSIS — H16142 Punctate keratitis, left eye: Secondary | ICD-10-CM | POA: Diagnosis not present

## 2021-03-28 ENCOUNTER — Telehealth: Payer: Self-pay | Admitting: Family Medicine

## 2021-03-28 NOTE — Telephone Encounter (Signed)
No I don't order them unless there is a recent compression fracture (then usually ortho does) That does not sound right

## 2021-03-28 NOTE — Telephone Encounter (Signed)
Mr. Jenna Young called in [facility] and they received a order for a back brace and they are requesting the last 2 office notes. The fax [phone]

## 2021-03-28 NOTE — Telephone Encounter (Signed)
No record of back brace mentioned in any notes, ?? If this is legit. Dr. Glori Bickers do you remember discussing or doing an order for a back brace, we usually don't do orders for braces unless pt has an appt to discuss it.

## 2021-03-29 NOTE — Telephone Encounter (Signed)
Will not send any notes since we didn't order brace

## 2021-04-24 ENCOUNTER — Other Ambulatory Visit: Payer: Self-pay

## 2021-04-24 ENCOUNTER — Ambulatory Visit (INDEPENDENT_AMBULATORY_CARE_PROVIDER_SITE_OTHER): Payer: Medicare Other

## 2021-04-24 DIAGNOSIS — E538 Deficiency of other specified B group vitamins: Secondary | ICD-10-CM

## 2021-04-24 MED ORDER — CYANOCOBALAMIN 1000 MCG/ML IJ SOLN
1000.0000 ug | Freq: Once | INTRAMUSCULAR | Status: AC
  Administered 2021-04-24: 1000 ug via INTRAMUSCULAR

## 2021-04-24 NOTE — Progress Notes (Signed)
Patient presented for B 12 injection given by Haruka Kowaleski, CMA to right deltoid, patient voiced no concerns nor showed any signs of distress during injection.  

## 2021-05-09 NOTE — Progress Notes (Signed)
Subjective:   Jenna Young is a 88 y.o. female who presents for Medicare Annual (Subsequent) preventive examination.  I connected with Kiyona Mcnall today by telephone and verified that I am speaking with the correct person using two identifiers. Location patient: home Location provider: work Persons participating in the virtual visit: patient, Marine scientist.    I discussed the limitations, risks, security and privacy concerns of performing an evaluation and management service by telephone and the availability of in person appointments. I also discussed with the patient that there may be a patient responsible charge related to this service. The patient expressed understanding and verbally consented to this telephonic visit.    Interactive audio and video telecommunications were attempted between this provider and patient, however failed, due to patient having technical difficulties OR patient did not have access to video capability.  We continued and completed visit with audio only.  Some vital signs may be absent or patient reported.   Time Spent with patient on telephone encounter: 20 minutes  Review of Systems     Cardiac Risk Factors include: advanced age (>24men, >70 women);hypertension;dyslipidemia     Objective:    Today's Vitals   05/10/21 0859  Weight: 134 lb (60.8 kg)  Height: 5\' 2"  (1.575 m)   Body mass index is 24.51 kg/m.  Advanced Directives 05/10/2021 05/23/2020 05/09/2020 01/17/2020 07/16/2019 05/21/2019 05/21/2019  Does Patient Have a Medical Advance Directive? No No No No No No No  Type of Advance Directive - - - - - - -  Does patient want to make changes to medical advance directive? Yes (MAU/Ambulatory/Procedural Areas - Information given) - - - - - -  Would patient like information on creating a medical advance directive? - - No - Patient declined No - Patient declined - No - Patient declined No - Patient declined    Current Medications (verified) Outpatient  Encounter Medications as of 05/10/2021  Medication Sig   aspirin 81 MG EC tablet Take 81 mg by mouth daily. Swallow whole.   carvedilol (COREG) 3.125 MG tablet TAKE 1 TABLET(3.125 MG) BY MOUTH TWICE DAILY   Cholecalciferol (VITAMIN D) 2000 UNITS CAPS Take 1 capsule by mouth daily.   clobetasol cream (TEMOVATE) 3.76 % Apply 1 application topically daily as needed. To itchy areas   clopidogrel (PLAVIX) 75 MG tablet TAKE 1 TABLET(75 MG) BY MOUTH DAILY WITH BREAKFAST   cyanocobalamin (,VITAMIN B-12,) 1000 MCG/ML injection Inject 1,000 mcg into the muscle every 30 (thirty) days.   isosorbide mononitrate (IMDUR) 30 MG 24 hr tablet TAKE 1/2 TABLET(15 MG) BY MOUTH DAILY   losartan (COZAAR) 100 MG tablet Take 1 tablet (100 mg total) by mouth daily.   nitroGLYCERIN (NITROSTAT) 0.4 MG SL tablet PLACE 1 TABLET UNDER TONGUE EVERY 5 MIN AS NEEDED FOR CHEST PAIN IF NO RELIEF IN15 MIN CALL 911 (MAX 3 TABS)   omeprazole (PRILOSEC) 20 MG capsule TAKE 1 CAPSULE(20 MG) BY MOUTH DAILY AS NEEDED   rosuvastatin (CRESTOR) 10 MG tablet TAKE 1 TABLET(10 MG) BY MOUTH DAILY   triamcinolone (KENALOG) 0.1 % Apply 1 application topically 2 (two) times daily as needed.   No facility-administered encounter medications on file as of 05/10/2021.    Allergies (verified) Buspirone hcl, Codeine, Hydrochlorothiazide, Iron, Lansoprazole, Minocycline hcl, Penicillins, Phenergan [promethazine hcl], Ramipril, Ranexa [ranolazine], Sulfa antibiotics, and Rosuvastatin   History: Past Medical History:  Diagnosis Date   Allergic rhinitis    Anemia    Anxiety    GERD (gastroesophageal reflux disease)  History of radiation therapy 04/29/16- 05/24/16   Left Breast 40.05 Gy in 15 fractions, Left Breast boost 10 Gy in 5 fractions.    Hyperlipidemia    Hypertension    treated by Dr Thurnell Garbe recently with syncope episode   Osteopenia    Osteoporosis    Personal history of radiation therapy 2018   Past Surgical History:  Procedure  Laterality Date   APPENDECTOMY     BREAST BIOPSY Left 11/29/2008   BREAST BIOPSY Left 01/26/2016   malignant   BREAST EXCISIONAL BIOPSY Left 12/26/2008   BREAST LUMPECTOMY Left 02/27/2016   BREAST LUMPECTOMY Left 04/01/2016   BREAST LUMPECTOMY WITH RADIOACTIVE SEED LOCALIZATION Left 02/27/2016   Procedure: LEFT BREAST LUMPECTOMY WITH RADIOACTIVE SEED LOCALIZATION;  Surgeon: Autumn Messing III, MD;  Location: [facility];  Service: General;  Laterality: Left;   BREAST SURGERY  10/10   benign biopsy  negative   CESAREAN SECTION     CHOLECYSTECTOMY     CORONARY STENT INTERVENTION N/A 02/11/2017   Procedure: CORONARY STENT INTERVENTION;  Surgeon: Nelva Bush, MD;  Location: [facility];  Service: Cardiovascular;  Laterality: N/A;   FEMUR FRACTURE SURGERY Right 2006   LEFT HEART CATH AND CORONARY ANGIOGRAPHY N/A 02/11/2017   Procedure: LEFT HEART CATH AND CORONARY ANGIOGRAPHY;  Surgeon: Nelva Bush, MD;  Location: [facility];  Service: Cardiovascular;  Laterality: N/A;   LEFT HEART CATH AND CORONARY ANGIOGRAPHY N/A 05/21/2019   Procedure: LEFT HEART CATH AND CORONARY ANGIOGRAPHY;  Surgeon: Wellington Hampshire, MD;  Location: [facility];  Service: Cardiovascular;  Laterality: N/A;   OVARIAN CYST SURGERY     RE-EXCISION OF BREAST LUMPECTOMY Left 04/01/2016   Procedure: RE-EXCISION OF LEFT BREAST MEDIAL MARGIN;  Surgeon: Autumn Messing III, MD;  Location: [facility];  Service: General;  Laterality: Left;   Family History  Problem Relation Age of Onset   Hypertension Mother    Heart disease Father    Macular degeneration Sister    Diabetes Maternal Aunt    Cancer Maternal Aunt        throat   Social History   Socioeconomic History   Marital status: Widowed    Spouse name: Not on file   Number of children: 1   Years of education: Not on file   Highest education level: Not on file  Occupational History    Employer: RETIRED  Tobacco  Use   Smoking status: Former    Types: Cigarettes    Quit date: 03/11/1968    Years since quitting: 53.2   Smokeless tobacco: Never  Vaping Use   Vaping Use: Never used  Substance and Sexual Activity   Alcohol use: No    Alcohol/week: 0.0 standard drinks   Drug use: No   Sexual activity: Not Currently  Other Topics Concern   Not on file  Social History Narrative   Not on file   Social Determinants of Health   Financial Resource Strain: Low Risk    Difficulty of Paying Living Expenses: Not hard at all  Food Insecurity: No Food Insecurity   Worried About Charity fundraiser in the Last Year: Never true   [place] in the Last Year: Never true  Transportation Needs: No Transportation Needs   Lack of Transportation (Medical): No   Lack of Transportation (Non-Medical): No  Physical Activity: Insufficiently Active   Days of Exercise per Week: 2 days   Minutes of Exercise  per Session: 10 min  Stress: No Stress Concern Present   Feeling of Stress : Not at all  Social Connections: Moderately Integrated   Frequency of Communication with Friends and Family: More than three times a week   Frequency of Social Gatherings with Friends and Family: Never   Attends Religious Services: More than 4 times per year   Active Member of [employer] or Organizations: Yes   Attends Archivist Meetings: More than 4 times per year   Marital Status: Widowed    Tobacco Counseling Counseling given: Not Answered   Clinical Intake:  Pre-visit preparation completed: Yes  Pain : No/denies pain     BMI - recorded: 24.51 Nutritional Status: BMI of 19-24  Normal Diabetes: No  How often do you need to have someone help you when you read instructions, pamphlets, or other written materials from your doctor or pharmacy?: 1 - Never  Diabetic? No  Interpreter Needed?: No  Information entered by :: Orrin Brigham LPN   Activities of Daily Living In your present state of health, do you  have any difficulty performing the following activities: 05/10/2021  Hearing? Y  Comment wears hearing aids  Vision? N  Difficulty concentrating or making decisions? N  Walking or climbing stairs? N  Dressing or bathing? N  Doing errands, shopping? N  Preparing Food and eating ? N  Using the Toilet? N  In the past six months, have you accidently leaked urine? Y  Do you have problems with loss of bowel control? N  Managing your Medications? N  Managing your Finances? N  Housekeeping or managing your Housekeeping? N  Some recent data might be hidden    Patient Care Team: Tower, [name], MD as PCP - General Rockey Situ, Kathlene November, MD as PCP - Cardiology (Cardiology) Lloyd Huger, MD as Consulting Physician (Oncology) Debbora Dus, [facility] as Pharmacist (Pharmacist)  Indicate any recent Medical Services you may have received from other than [facility] providers in the past year (date may be approximate).     Assessment:   This is a routine wellness examination for [place].  Hearing/Vision screen Hearing Screening - Comments:: Wears hearing aids Vision Screening - Comments:: Last exam 04/2021, Dr. Matilde Sprang, wears glasses to read  Dietary issues and exercise activities discussed: Current Exercise Habits: Home exercise routine, Type of exercise: Other - see comments (rides stationary bike), Time (Minutes): 10, Frequency (Times/Week): 2, Weekly Exercise (Minutes/Week): 20, Intensity: Mild   Goals Addressed             This Visit's Progress    Patient Stated       Would like to continue to exercise on stationary bike       Depression Screen PHQ 2/9 Scores 05/10/2021 05/09/2020 09/20/2019 06/21/2019 04/14/2019 04/08/2018 05/13/2017  PHQ - 2 Score 0 0 0 0 0 0 0  PHQ- 9 Score - 0 1 1 0 0 0    Fall Risk Fall Risk  05/10/2021 05/09/2020 06/17/2019 04/14/2019 04/08/2018  Falls in the past year? 0 0 1 0 0  Number falls in past yr: 0 0 0 0 -  Injury with Fall? 0 0 0 0 -  Risk for fall due to : No Fall Risks  Medication side effect Medication side effect Medication side effect -  Follow up Falls prevention discussed Falls evaluation completed;Falls prevention discussed - Falls evaluation completed;Falls prevention discussed -    FALL RISK PREVENTION PERTAINING TO THE HOME:  Any stairs in or around the  home? Yes  If so, are there any without handrails? No  Home free of loose throw rugs in walkways, pet beds, electrical cords, etc? Yes  Adequate lighting in your home to reduce risk of falls? Yes   ASSISTIVE DEVICES UTILIZED TO PREVENT FALLS:  Life alert? No  Use of a cane, walker or w/c? No  Grab bars in the bathroom? Yes  Shower chair or bench in shower? No   Elevated toilet seat or a handicapped toilet? No   TIMED UP AND GO:  Was the test performed? No .    Cognitive Function: Normal cognitive status assessed by  this Nurse Health Advisor. No abnormalities found.   MMSE - Mini Mental State Exam 05/09/2020 04/14/2019 04/08/2018 03/31/2017 03/21/2016  Orientation to time 5 5 5 5 5   Orientation to Place 5 5 5 5 5   Registration 3 3 3 3 3   Attention/ Calculation 5 5 0 0 0  Recall 3 3 3 3 1   Recall-comments - - - - pt was unable to recall 2 of 3 words  Language- name 2 objects - - 0 0 0  Language- repeat 1 1 1 1 1   Language- follow 3 step command - - 3 3 3   Language- read & follow direction - - 0 0 0  Write a sentence - - 0 0 0  Copy design - - 0 0 0  Total score - - 20 20 18         Immunizations Immunization History  Administered Date(s) Administered   Fluad Quad(high Dose 65+) 12/29/2018, 12/11/2019, 01/12/2021   Influenza Split 12/11/2010, 01/17/2012   Influenza Whole 01/09/2006, 12/10/2006, 12/08/2007, 12/13/2009   Influenza,inj,Quad PF,6+ Mos 12/09/2012, 12/15/2013, 12/22/2014, 12/21/2015, 12/24/2016, 01/14/2018   PFIZER(Purple Top)SARS-COV-2 Vaccination 04/05/2019, 04/26/2019, 01/12/2020   Pneumococcal Conjugate-13 02/23/2014   Pneumococcal Polysaccharide-23 12/11/2010    Td 01/10/2008    TDAP status: Due, Education has been provided regarding the importance of this vaccine. Advised may receive this vaccine at local pharmacy or Health Dept. Aware to provide a copy of the vaccination record if obtained from local pharmacy or Health Dept. Verbalized acceptance and understanding.  Flu Vaccine status: Up to date  Pneumococcal vaccine status: Up to date  Covid-19 vaccine status: Information provided on how to obtain vaccines.   Qualifies for Shingles Vaccine? Yes   Zostavax completed No   Shingrix Completed?: No.    Education has been provided regarding the importance of this vaccine. Patient has been advised to call insurance company to determine out of pocket expense if they have not yet received this vaccine. Advised may also receive vaccine at local pharmacy or Health Dept. Verbalized acceptance and understanding.  Screening Tests Health Maintenance  Topic Date Due   Zoster Vaccines- Shingrix (1 of 2) Never done   COVID-19 Vaccine (4 - Booster for Pfizer series) 03/08/2020   DEXA SCAN  03/21/2025 (Originally 01/11/1999)   TETANUS/TDAP  05/09/2025 (Originally 01/09/2018)   MAMMOGRAM  12/01/2021   Pneumonia Vaccine 64+ Years old  Completed   INFLUENZA VACCINE  Completed   HPV VACCINES  Aged Out   COLONOSCOPY (Pts 45-82yrs Insurance coverage will need to be confirmed)  Discontinued    Health Maintenance  Health Maintenance Due  Topic Date Due   Zoster Vaccines- Shingrix (1 of 2) Never done   COVID-19 Vaccine (4 - Booster for Pfizer series) 03/08/2020    Colorectal cancer screening: No longer required.   Mammogram status: Completed 12/01/20. Repeat every year  Bone Density  status: No longer required   Lung Cancer Screening: (Low Dose CT Chest recommended if Age 73-80 years, 30 pack-year currently smoking OR have quit w/in 15years.) does qualify.     Additional Screening:  Hepatitis C Screening: does not qualify  Vision Screening: Recommended  annual ophthalmology exams for early detection of glaucoma and other disorders of the eye. Is the patient up to date with their annual eye exam?  Yes  Who is the provider or what is the name of the office in which the patient attends annual eye exams? Dr. Matilde Sprang   Dental Screening: Recommended annual dental exams for proper oral hygiene  Community Resource Referral / Chronic Care Management: CRR required this visit?  No   CCM required this visit?  No      Plan:     I have personally reviewed and noted the following in the patients chart:   Medical and social history Use of alcohol, tobacco or illicit drugs  Current medications and supplements including opioid prescriptions.  Functional ability and status Nutritional status Physical activity Advanced directives List of other physicians Hospitalizations, surgeries, and ER visits in previous 12 months Vitals Screenings to include cognitive, depression, and falls Referrals and appointments  In addition, I have reviewed and discussed with patient certain preventive protocols, quality metrics, and best practice recommendations. A written personalized care plan for preventive services as well as general preventive health recommendations were provided to patient.   Due to this being a telephonic visit, the after visit summary with patients personalized plan was offered to patient via mail or my-chart. Patient would like to access on my-chart.     Loma Messing, LPN   [id number]/09/[id number]   Nurse Health Advisor  Nurse Notes: none

## 2021-05-10 ENCOUNTER — Ambulatory Visit (INDEPENDENT_AMBULATORY_CARE_PROVIDER_SITE_OTHER): Payer: Medicare Other

## 2021-05-10 ENCOUNTER — Telehealth: Payer: Self-pay | Admitting: Family Medicine

## 2021-05-10 VITALS — Ht 62.0 in | Wt 134.0 lb

## 2021-05-10 DIAGNOSIS — R7303 Prediabetes: Secondary | ICD-10-CM

## 2021-05-10 DIAGNOSIS — E559 Vitamin D deficiency, unspecified: Secondary | ICD-10-CM

## 2021-05-10 DIAGNOSIS — E538 Deficiency of other specified B group vitamins: Secondary | ICD-10-CM

## 2021-05-10 DIAGNOSIS — Z0000 Encounter for general adult medical examination without abnormal findings: Secondary | ICD-10-CM

## 2021-05-10 DIAGNOSIS — E782 Mixed hyperlipidemia: Secondary | ICD-10-CM

## 2021-05-10 DIAGNOSIS — M810 Age-related osteoporosis without current pathological fracture: Secondary | ICD-10-CM

## 2021-05-10 DIAGNOSIS — I10 Essential (primary) hypertension: Secondary | ICD-10-CM

## 2021-05-10 NOTE — Telephone Encounter (Signed)
-----   Message from Ellamae Sia sent at 04/30/2021  4:25 PM EST ----- ?Regarding: lab orders for Friday, 3.3.23 ?Patient is scheduled for CPX labs, please order future labs, Thanks , Terri ? ? ?

## 2021-05-10 NOTE — Patient Instructions (Signed)
Jenna Young , Thank you for taking time to come for your Medicare Wellness Visit. I appreciate your ongoing commitment to your health goals. Please review the following plan we discussed and let me know if I can assist you in the future.   Screening recommendations/referrals: Colonoscopy: no longer required  Mammogram: up to date, completed 12/01/20, due 12/01/21 Bone Density: no longer required  Recommended yearly ophthalmology/optometry visit for glaucoma screening and checkup Recommended yearly dental visit for hygiene and checkup  Vaccinations: Influenza vaccine: up to date  Pneumococcal vaccine: up to date  Tdap vaccine: due 2019, medicare may cover in the event that you are cut or injured  Shingles vaccine: Discuss with pharmacy, if you change your mind   Covid-19:newest booster available at your local pharmacy   Advanced directives: information available at your next visit  Conditions/risks identified: see problem list   Next appointment: Follow up in one year for your annual wellness visit 05/13/22 @ 12:00pm, this will be a telephone visit   Preventive Care 65 Years and Older, Female Preventive care refers to lifestyle choices and visits with your health care provider that can promote health and wellness. What does preventive care include? A yearly physical exam. This is also called an annual well check. Dental exams once or twice a year. Routine eye exams. Ask your health care provider how often you should have your eyes checked. Personal lifestyle choices, including: Daily care of your teeth and gums. Regular physical activity. Eating a healthy diet. Avoiding tobacco and drug use. Limiting alcohol use. Practicing safe sex. Taking low-dose aspirin every day. Taking vitamin and mineral supplements as recommended by your health care provider. What happens during an annual well check? The services and screenings done by your health care provider during your annual well check  will depend on your age, overall health, lifestyle risk factors, and family history of disease. Counseling  Your health care provider may ask you questions about your: Alcohol use. Tobacco use. Drug use. Emotional well-being. Home and relationship well-being. Sexual activity. Eating habits. History of falls. Memory and ability to understand (cognition). Work and work Statistician. Reproductive health. Screening  You may have the following tests or measurements: Height, weight, and BMI. Blood pressure. Lipid and cholesterol levels. These may be checked every 5 years, or more frequently if you are over 59 years old. Skin check. Lung cancer screening. You may have this screening every year starting at age 89 if you have a 30-pack-year history of smoking and currently smoke or have quit within the past 15 years. Fecal occult blood test (FOBT) of the stool. You may have this test every year starting at age 22. Flexible sigmoidoscopy or colonoscopy. You may have a sigmoidoscopy every 5 years or a colonoscopy every 10 years starting at age 20. Hepatitis C blood test. Hepatitis B blood test. Sexually transmitted disease (STD) testing. Diabetes screening. This is done by checking your blood sugar (glucose) after you have not eaten for a while (fasting). You may have this done every 1-3 years. Bone density scan. This is done to screen for osteoporosis. You may have this done starting at age 43. Mammogram. This may be done every 1-2 years. Talk to your health care provider about how often you should have regular mammograms. Talk with your health care provider about your test results, treatment options, and if necessary, the need for more tests. Vaccines  Your health care provider may recommend certain vaccines, such as: Influenza vaccine. This is recommended every  year. Tetanus, diphtheria, and acellular pertussis (Tdap, Td) vaccine. You may need a Td booster every 10 years. Zoster vaccine. You  may need this after age 77. Pneumococcal 13-valent conjugate (PCV13) vaccine. One dose is recommended after age 51. Pneumococcal polysaccharide (PPSV23) vaccine. One dose is recommended after age 44. Talk to your health care provider about which screenings and vaccines you need and how often you need them. This information is not intended to replace advice given to you by your health care provider. Make sure you discuss any questions you have with your health care provider. Document Released: 03/24/2015 Document Revised: 11/15/2015 Document Reviewed: 12/27/2014 Elsevier Interactive Patient Education  2017 [place] Prevention in the Home Falls can cause injuries. They can happen to people of all ages. There are many things you can do to make your home safe and to help prevent falls. What can I do on the outside of my home? Regularly fix the edges of walkways and driveways and fix any cracks. Remove anything that might make you trip as you walk through a door, such as a raised step or threshold. Trim any bushes or trees on the path to your home. Use bright outdoor lighting. Clear any walking paths of anything that might make someone trip, such as rocks or tools. Regularly check to see if handrails are loose or broken. Make sure that both sides of any steps have handrails. Any raised decks and porches should have guardrails on the edges. Have any leaves, snow, or ice cleared regularly. Use sand or salt on walking paths during winter. Clean up any spills in your garage right away. This includes oil or grease spills. What can I do in the bathroom? Use night lights. Install grab bars by the toilet and in the tub and shower. Do not use towel bars as grab bars. Use non-skid mats or decals in the tub or shower. If you need to sit down in the shower, use a plastic, non-slip stool. Keep the floor dry. Clean up any water that spills on the floor as soon as it happens. Remove soap buildup  in the tub or shower regularly. Attach bath mats securely with double-sided non-slip rug tape. Do not have throw rugs and other things on the floor that can make you trip. What can I do in the bedroom? Use night lights. Make sure that you have a light by your bed that is easy to reach. Do not use any sheets or blankets that are too big for your bed. They should not hang down onto the floor. Have a firm chair that has side arms. You can use this for support while you get dressed. Do not have throw rugs and other things on the floor that can make you trip. What can I do in the kitchen? Clean up any spills right away. Avoid walking on wet floors. Keep items that you use a lot in easy-to-reach places. If you need to reach something above you, use a strong step stool that has a grab bar. Keep electrical cords out of the way. Do not use floor polish or wax that makes floors slippery. If you must use wax, use non-skid floor wax. Do not have throw rugs and other things on the floor that can make you trip. What can I do with my stairs? Do not leave any items on the stairs. Make sure that there are handrails on both sides of the stairs and use them. Fix handrails that are broken or  loose. Make sure that handrails are as long as the stairways. Check any carpeting to make sure that it is firmly attached to the stairs. Fix any carpet that is loose or worn. Avoid having throw rugs at the top or bottom of the stairs. If you do have throw rugs, attach them to the floor with carpet tape. Make sure that you have a light switch at the top of the stairs and the bottom of the stairs. If you do not have them, ask someone to add them for you. What else can I do to help prevent falls? Wear shoes that: Do not have high heels. Have rubber bottoms. Are comfortable and fit you well. Are closed at the toe. Do not wear sandals. If you use a stepladder: Make sure that it is fully opened. Do not climb a closed  stepladder. Make sure that both sides of the stepladder are locked into place. Ask someone to hold it for you, if possible. Clearly mark and make sure that you can see: Any grab bars or handrails. First and last steps. Where the edge of each step is. Use tools that help you move around (mobility aids) if they are needed. These include: Canes. Walkers. Scooters. Crutches. Turn on the lights when you go into a dark area. Replace any light bulbs as soon as they burn out. Set up your furniture so you have a clear path. Avoid moving your furniture around. If any of your floors are uneven, fix them. If there are any pets around you, be aware of where they are. Review your medicines with your doctor. Some medicines can make you feel dizzy. This can increase your chance of falling. Ask your doctor what other things that you can do to help prevent falls. This information is not intended to replace advice given to you by your health care provider. Make sure you discuss any questions you have with your health care provider. Document Released: 12/22/2008 Document Revised: 08/03/2015 Document Reviewed: 04/01/2014 Elsevier Interactive Patient Education  2017 [employer].

## 2021-05-11 ENCOUNTER — Other Ambulatory Visit: Payer: Self-pay

## 2021-05-11 ENCOUNTER — Other Ambulatory Visit (INDEPENDENT_AMBULATORY_CARE_PROVIDER_SITE_OTHER): Payer: Medicare Other

## 2021-05-11 DIAGNOSIS — M810 Age-related osteoporosis without current pathological fracture: Secondary | ICD-10-CM

## 2021-05-11 DIAGNOSIS — E782 Mixed hyperlipidemia: Secondary | ICD-10-CM

## 2021-05-11 DIAGNOSIS — E559 Vitamin D deficiency, unspecified: Secondary | ICD-10-CM | POA: Diagnosis not present

## 2021-05-11 DIAGNOSIS — E538 Deficiency of other specified B group vitamins: Secondary | ICD-10-CM | POA: Diagnosis not present

## 2021-05-11 DIAGNOSIS — R7303 Prediabetes: Secondary | ICD-10-CM | POA: Diagnosis not present

## 2021-05-11 DIAGNOSIS — I10 Essential (primary) hypertension: Secondary | ICD-10-CM

## 2021-05-11 LAB — COMPREHENSIVE METABOLIC PANEL
ALT: 8 U/L (ref 0–35)
AST: 13 U/L (ref 0–37)
Albumin: 4.1 g/dL (ref 3.5–5.2)
Alkaline Phosphatase: 64 U/L (ref 39–117)
BUN: 12 mg/dL (ref 6–23)
CO2: 27 mEq/L (ref 19–32)
Calcium: 9.3 mg/dL (ref 8.4–10.5)
Chloride: 101 mEq/L (ref 96–112)
Creatinine, Ser: 0.91 mg/dL (ref 0.40–1.20)
GFR: 56.79 mL/min — ABNORMAL LOW (ref >60.00)
Glucose, Bld: 86 mg/dL (ref 70–99)
Potassium: 4.6 mEq/L (ref 3.5–5.1)
Sodium: 135 mEq/L (ref 135–145)
Total Bilirubin: 0.5 mg/dL (ref 0.2–1.2)
Total Protein: 7.5 g/dL (ref 6.0–8.3)

## 2021-05-11 LAB — CBC WITH DIFFERENTIAL/PLATELET
Basophils Absolute: 0 10*3/uL (ref 0.0–0.1)
Basophils Relative: 0.9 % (ref 0.0–3.0)
Eosinophils Absolute: 0.1 10*3/uL (ref 0.0–0.7)
Eosinophils Relative: 2.1 % (ref 0.0–5.0)
HCT: 33.9 % — ABNORMAL LOW (ref 36.0–46.0)
Hemoglobin: 11 g/dL — ABNORMAL LOW (ref 12.0–15.0)
Lymphocytes Relative: 36.4 % (ref 12.0–46.0)
Lymphs Abs: 1.7 10*3/uL (ref 0.7–4.0)
MCHC: 32.3 g/dL (ref 30.0–36.0)
MCV: 83.3 fl (ref 78.0–100.0)
Monocytes Absolute: 0.4 10*3/uL (ref 0.1–1.0)
Monocytes Relative: 9.6 % (ref 3.0–12.0)
Neutro Abs: 2.4 10*3/uL (ref 1.4–7.7)
Neutrophils Relative %: 51 % (ref 43.0–77.0)
Platelets: 214 10*3/uL (ref 150.0–400.0)
RBC: 4.08 Mil/uL (ref 3.87–5.11)
RDW: 15.9 % — ABNORMAL HIGH (ref 11.5–15.5)
WBC: 4.7 10*3/uL (ref 4.0–10.5)

## 2021-05-11 LAB — LIPID PANEL
Cholesterol: 147 mg/dL (ref 0–200)
HDL: 62.8 mg/dL (ref >39.00)
LDL Cholesterol: 65 mg/dL (ref 0–99)
NonHDL: 84.63
Total CHOL/HDL Ratio: 2
Triglycerides: 98 mg/dL (ref 0.0–149.0)
VLDL: 19.6 mg/dL (ref 0.0–40.0)

## 2021-05-11 LAB — VITAMIN D 25 HYDROXY (VIT D DEFICIENCY, FRACTURES): VITD: 63.77 ng/mL (ref 30.00–100.00)

## 2021-05-11 LAB — VITAMIN B12: Vitamin B-12: 553 pg/mL (ref 211–911)

## 2021-05-11 LAB — TSH: TSH: 2.65 u[IU]/mL (ref 0.35–5.50)

## 2021-05-11 LAB — HEMOGLOBIN A1C: Hgb A1c MFr Bld: 6.2 % (ref 4.6–6.5)

## 2021-05-14 ENCOUNTER — Telehealth: Payer: Self-pay | Admitting: Cardiovascular Disease

## 2021-05-14 NOTE — Telephone Encounter (Signed)
?*  STAT* If patient is at the pharmacy, call can be transferred to refill team. ? ? ?1. Which medications need to be refilled? (please list name of each medication and dose if known) Isosorbide mononitrate '30mg'$  ? ?2. Which pharmacy/location (including street and city if local pharmacy) is medication to be sent to? [facility]  ? ?3. Do they need a 30 day or 90 day supply? 90 day ?

## 2021-05-15 MED ORDER — ISOSORBIDE MONONITRATE ER 30 MG PO TB24: ORAL_TABLET | ORAL | 2 refills | Status: DC

## 2021-05-15 NOTE — Telephone Encounter (Signed)
Requested Prescriptions  ? ?Signed Prescriptions Disp Refills  ? isosorbide mononitrate (IMDUR) 30 MG 24 hr tablet 45 tablet 2  ?  Sig: TAKE 1/2 TABLET(15 MG) BY MOUTH DAILY  ?  Authorizing Provider: Minna Merritts  ?  Ordering User: Othelia Pulling C  ? ? ?

## 2021-05-18 ENCOUNTER — Ambulatory Visit (INDEPENDENT_AMBULATORY_CARE_PROVIDER_SITE_OTHER)
Admission: RE | Admit: 2021-05-18 | Discharge: 2021-05-18 | Disposition: A | Discharge status: Home / Self Care | Payer: Medicare Other | Source: Ambulatory Visit | Attending: Family Medicine | Admitting: Family Medicine

## 2021-05-18 ENCOUNTER — Encounter: Payer: Self-pay | Admitting: Family Medicine

## 2021-05-18 ENCOUNTER — Other Ambulatory Visit: Payer: Self-pay

## 2021-05-18 ENCOUNTER — Ambulatory Visit (INDEPENDENT_AMBULATORY_CARE_PROVIDER_SITE_OTHER): Payer: Medicare Other | Admitting: Family Medicine

## 2021-05-18 VITALS — BP 135/70 | HR 62 | Temp 97.0°F | Ht 60.0 in | Wt 138.0 lb

## 2021-05-18 DIAGNOSIS — I25118 Atherosclerotic heart disease of native coronary artery with other forms of angina pectoris: Secondary | ICD-10-CM

## 2021-05-18 DIAGNOSIS — N898 Other specified noninflammatory disorders of vagina: Secondary | ICD-10-CM | POA: Diagnosis not present

## 2021-05-18 DIAGNOSIS — M810 Age-related osteoporosis without current pathological fracture: Secondary | ICD-10-CM | POA: Diagnosis not present

## 2021-05-18 DIAGNOSIS — R2689 Other abnormalities of gait and mobility: Secondary | ICD-10-CM

## 2021-05-18 DIAGNOSIS — I10 Essential (primary) hypertension: Secondary | ICD-10-CM | POA: Diagnosis not present

## 2021-05-18 DIAGNOSIS — M79674 Pain in right toe(s): Secondary | ICD-10-CM

## 2021-05-18 DIAGNOSIS — R7303 Prediabetes: Secondary | ICD-10-CM | POA: Diagnosis not present

## 2021-05-18 DIAGNOSIS — D509 Iron deficiency anemia, unspecified: Secondary | ICD-10-CM | POA: Diagnosis not present

## 2021-05-18 DIAGNOSIS — E559 Vitamin D deficiency, unspecified: Secondary | ICD-10-CM

## 2021-05-18 DIAGNOSIS — E782 Mixed hyperlipidemia: Secondary | ICD-10-CM | POA: Diagnosis not present

## 2021-05-18 DIAGNOSIS — E538 Deficiency of other specified B group vitamins: Secondary | ICD-10-CM | POA: Diagnosis not present

## 2021-05-18 MED ORDER — CLOPIDOGREL BISULFATE 75 MG PO TABS: ORAL_TABLET | ORAL | 3 refills | Status: DC

## 2021-05-18 MED ORDER — TERCONAZOLE 0.8 % VA CREA: TOPICAL_CREAM | VAGINAL | 0 refills | Status: DC

## 2021-05-18 MED ORDER — CYANOCOBALAMIN 1000 MCG/ML IJ SOLN
1000.0000 ug | Freq: Once | INTRAMUSCULAR | Status: AC
  Administered 2021-05-18: 1000 ug via INTRAMUSCULAR

## 2021-05-18 NOTE — Assessment & Plan Note (Signed)
Lab Results  ?Component Value Date  ? [id number] 05/11/2021  ? ?Shot every 30 day ?Given today  ?

## 2021-05-18 NOTE — Assessment & Plan Note (Addendum)
bp in fair control at this time  ?BP Readings from Last 1 Encounters:  ?05/18/21 135/70  ? ?No changes needed ?Most recent labs reviewed  ?Disc lifstyle change with low sodium diet and exercise  ?Plan to continue  ?Losartan 100 mg daily  ?Cored 3.125 mg tic ?Isosorbide 30 mg daily  ?

## 2021-05-18 NOTE — Assessment & Plan Note (Signed)
Likely yeast ?Sent diflucan pill  ?inst to f/u if not imp ?

## 2021-05-18 NOTE — Assessment & Plan Note (Signed)
Vitamin D level is therapeutic with current supplementation ?Disc importance of this to bone and overall health ? ?Level in 26s  ? ?

## 2021-05-18 NOTE — Patient Instructions (Addendum)
I will place an order for physical therapy in [place]  ?The office will call you to set that up  ? ?B12 shot today while you are here  ? ?Xray of your foot to look at your toe  ?We will call you with a result  ? ?Try and drink more water and less sweet tea  ? ? ?

## 2021-05-18 NOTE — Assessment & Plan Note (Signed)
Pt continues to decline dexa or treatment  ?No falls or fx  ?On vit D and level is good  ?Poor balance  ?Ref to PT done and disc fall prev ?

## 2021-05-18 NOTE — Assessment & Plan Note (Signed)
Pt reported ?No falls ?Disc fall prec  ?Likely multifactorial  ?Ref to PT  ?

## 2021-05-18 NOTE — Progress Notes (Signed)
Subjective:    Patient ID: Jenna Young, female    DOB: 03-23-1931, 88 y.o.   MRN: [id number]  This visit occurred during the SARS-CoV-2 public health emergency.  Safety protocols were in place, including screening questions prior to the visit, additional usage of staff PPE, and extensive cleaning of exam room while observing appropriate contact time as indicated for disinfecting solutions.   HPI Pt presents for annual f/u of chronic health problems   Wt Readings from Last 3 Encounters:  05/18/21 138 lb (62.6 kg)  05/10/21 134 lb (60.8 kg)  03/14/21 134 lb (60.8 kg)   26.95 kg/m  Takes her time now  Feels pretty good for her age  Renato Gails a little weight  Hard to cook for one , does not like to eat out  Some salads however    Stubbed her toe Monday  Carrying groceries coming into her kitchen  R great toe    Amw done on 3/2-reviewed  Zoster status : declines   Covid status : has not had covid   OP Declines further dexa despite h/o fracture   Falls : none  Fractures :none she knows of  D level is good at 63.7 - taking vitamin D 2000 iu daily   Not a lot of exercise  Less steady with walking  She needs someone to walk with her  She gets 300 steps at the grocery store   1 friend moved to [facility] with dementia  1 friend she still sees   Does read a lot to help her memory  Some puzzles    HTN  bp is stable today  No cp or palpitations or headaches or edema  No side effects to medicines  BP Readings from Last 3 Encounters:  05/18/21 135/70  03/14/21 134/72  03/01/21 137/62    Pulse Readings from Last 3 Encounters:  05/18/21 62  03/14/21 70  01/02/21 66   In setting of CAD Takes plavix and crestor   Losartan 100 mg daily  Cored 3.125 mg tic Isosorbide 30 mg daily    Hyperlipidemia  Lab Results  Component Value Date   CHOL 147 05/11/2021   CHOL 184 05/03/2020   CHOL 200 04/14/2019   Lab Results  Component Value Date   HDL 62.80 05/11/2021    HDL 59.60 05/03/2020   HDL 55.50 04/14/2019   Lab Results  Component Value Date   LDLCALC 65 05/11/2021   [place] 95 05/03/2020   LDLCALC 121 (H) 04/14/2019   Lab Results  Component Value Date   TRIG 98.0 05/11/2021   TRIG 147.0 05/03/2020   TRIG 117.0 04/14/2019   Lab Results  Component Value Date   CHOLHDL 2 05/11/2021   CHOLHDL 3 05/03/2020   CHOLHDL 4 04/14/2019   Lab Results  Component Value Date   LDLDIRECT 116.6 01/27/2013   LDLDIRECT 114.5 12/04/2010   LDLDIRECT 117.1 04/23/2010   Crestor 10 mg daily   Prediabetes Lab Results  Component Value Date   HGBA1C 6.2 05/11/2021  Up from 6.0    B12 deficiency  Gets shots every 30 days Lab Results  Component Value Date   VITAMINB12 553 05/11/2021    H/o anemia  Lab Results  Component Value Date   WBC 4.7 05/11/2021   HGB 11.0 (L) 05/11/2021   HCT 33.9 (L) 05/11/2021   MCV 83.3 05/11/2021   PLT 214.0 05/11/2021   Saw hematology in the past  Gets IV iron if very low  Intol to  oral iron  Lab Results  Component Value Date   IRON 73 05/23/2020   TIBC 368 05/23/2020   FERRITIN 60 05/23/2020   Vaginal itching - yeast Last ua clear     Patient Active Problem List   Diagnosis Date Noted   Poor balance 05/18/2021   Great toe pain, right 05/18/2021   Urinary frequency 03/15/2021   Intertrigo labialis 03/15/2021   Frequent urination 08/24/2020   History of COVID-19 08/03/2020   Dermatitis 05/03/2020   Secondary hypertension    Iron deficiency anemia 05/12/2019   Prediabetes 04/15/2018   Encounter for screening mammogram for breast cancer 04/01/2017   H/O syncope 02/28/2017   Coronary artery disease of native artery of native heart with stable angina pectoris ([place]) 02/21/2017   History of non-ST elevation myocardial infarction (NSTEMI) 02/10/2017   History of breast cancer 02/26/2016   Varicose vein of leg 10/09/2015   Fatigue 10/09/2015   Pedal edema 10/09/2015   Heart murmur 10/09/2015    History of spinal fracture 05/07/2015   Dysthymia 09/20/2013   Encounter for Medicare annual wellness exam 02/03/2013   Vaginal itching 04/01/2012   Vitamin D deficiency 08/19/2008   ANXIETY 01/29/2008   Essential hypertension, benign 09/25/2007   B12 deficiency 09/24/2006   Hyperlipidemia 09/24/2006   Venous (peripheral) insufficiency 09/24/2006   ALLERGIC RHINITIS 09/24/2006   GERD 09/24/2006   Osteoporosis 09/24/2006   Past Medical History:  Diagnosis Date   Allergic rhinitis    Anemia    Anxiety    GERD (gastroesophageal reflux disease)    History of radiation therapy 04/29/16- 05/24/16   Left Breast 40.05 Gy in 15 fractions, Left Breast boost 10 Gy in 5 fractions.    Hyperlipidemia    Hypertension    treated by Dr Thurnell Garbe recently with syncope episode   Osteopenia    Osteoporosis    Personal history of radiation therapy 2018   Past Surgical History:  Procedure Laterality Date   APPENDECTOMY     BREAST BIOPSY Left 11/29/2008   BREAST BIOPSY Left 01/26/2016   malignant   BREAST EXCISIONAL BIOPSY Left 12/26/2008   BREAST LUMPECTOMY Left 02/27/2016   BREAST LUMPECTOMY Left 04/01/2016   BREAST LUMPECTOMY WITH RADIOACTIVE SEED LOCALIZATION Left 02/27/2016   Procedure: LEFT BREAST LUMPECTOMY WITH RADIOACTIVE SEED LOCALIZATION;  Surgeon: Autumn Messing III, MD;  Location: [facility];  Service: General;  Laterality: Left;   BREAST SURGERY  10/10   benign biopsy  negative   CESAREAN SECTION     CHOLECYSTECTOMY     CORONARY STENT INTERVENTION N/A 02/11/2017   Procedure: CORONARY STENT INTERVENTION;  Surgeon: Nelva Bush, MD;  Location: [place] [facility];  Service: Cardiovascular;  Laterality: N/A;   FEMUR FRACTURE SURGERY Right 2006   LEFT HEART CATH AND CORONARY ANGIOGRAPHY N/A 02/11/2017   Procedure: LEFT HEART CATH AND CORONARY ANGIOGRAPHY;  Surgeon: Nelva Bush, MD;  Location: [place] [facility];  Service: Cardiovascular;  Laterality: N/A;    LEFT HEART CATH AND CORONARY ANGIOGRAPHY N/A 05/21/2019   Procedure: LEFT HEART CATH AND CORONARY ANGIOGRAPHY;  Surgeon: Wellington Hampshire, MD;  Location: [place] [facility];  Service: Cardiovascular;  Laterality: N/A;   OVARIAN CYST SURGERY     RE-EXCISION OF BREAST LUMPECTOMY Left 04/01/2016   Procedure: RE-EXCISION OF LEFT BREAST MEDIAL MARGIN;  Surgeon: Autumn Messing III, MD;  Location: [facility];  Service: General;  Laterality: Left;   Social History   Tobacco Use   Smoking status:  Former    Types: Cigarettes    Quit date: 03/11/1968    Years since quitting: 53.2   Smokeless tobacco: Never  Vaping Use   Vaping Use: Never used  Substance Use Topics   Alcohol use: No    Alcohol/week: 0.0 standard drinks   Drug use: No   Family History  Problem Relation Age of Onset   Hypertension Mother    Heart disease Father    Macular degeneration Sister    Diabetes Maternal Aunt    Cancer Maternal Aunt        throat   Allergies  Allergen Reactions   Buspirone Hcl     REACTION: HEART PALPITATIONS   Codeine     REACTION: nausea and vomiting   Hydrochlorothiazide     REACTION: syncope-possibly from dehydration   Iron     Oral iron -per pt made her "pass out"    Lansoprazole     REACTION: abd pain   Minocycline Hcl    Penicillins     REACTION: mouth numbness Has patient had a PCN reaction causing immediate rash, facial/tongue/throat swelling, SOB or lightheadedness with hypotension: No Has patient had a PCN reaction causing severe rash involving mucus membranes or skin necrosis: No Has patient had a PCN reaction that required hospitalization: No Has patient had a PCN reaction occurring within the last 10 years: No If all of the above answers are "NO", then may proceed with Cephalosporin use.   Phenergan [Promethazine Hcl] Other (See Comments)    Body shakes, hallucinations   Ramipril     Cough    Ranexa [Ranolazine]     Constipation    Sulfa Antibiotics Nausea  Only   Rosuvastatin     Patient reports muscle pain, unable to take. Stopped around 07/22.   Current Outpatient Medications on File Prior to Visit  Medication Sig Dispense Refill   aspirin 81 MG EC tablet Take 81 mg by mouth daily. Swallow whole.     carvedilol (COREG) 3.125 MG tablet TAKE 1 TABLET(3.125 MG) BY MOUTH TWICE DAILY 180 tablet 2   Cholecalciferol (VITAMIN D) 2000 UNITS CAPS Take 1 capsule by mouth daily.     cyanocobalamin (,VITAMIN B-12,) 1000 MCG/ML injection Inject 1,000 mcg into the muscle every 30 (thirty) days.     isosorbide mononitrate (IMDUR) 30 MG 24 hr tablet TAKE 1/2 TABLET(15 MG) BY MOUTH DAILY 45 tablet 2   losartan (COZAAR) 100 MG tablet Take 1 tablet (100 mg total) by mouth daily. 90 tablet 1   nitroGLYCERIN (NITROSTAT) 0.4 MG SL tablet PLACE 1 TABLET UNDER TONGUE EVERY 5 MIN AS NEEDED FOR CHEST PAIN IF NO RELIEF IN15 MIN CALL 911 (MAX 3 TABS) 25 tablet 1   omeprazole (PRILOSEC) 20 MG capsule TAKE 1 CAPSULE(20 MG) BY MOUTH DAILY AS NEEDED 90 capsule 2   rosuvastatin (CRESTOR) 10 MG tablet TAKE 1 TABLET(10 MG) BY MOUTH DAILY 90 tablet 2   No current facility-administered medications on file prior to visit.     Review of Systems  Constitutional:  Positive for fatigue. Negative for activity change, appetite change, fever and unexpected weight change.  HENT:  Negative for congestion, ear pain, rhinorrhea, sinus pressure and sore throat.   Eyes:  Negative for pain, redness and visual disturbance.  Respiratory:  Negative for cough, shortness of breath and wheezing.   Cardiovascular:  Negative for chest pain and palpitations.  Gastrointestinal:  Negative for abdominal pain, blood in stool, constipation and diarrhea.  Endocrine: Negative for  polydipsia and polyuria.  Genitourinary:  Negative for dysuria, frequency and urgency.  Musculoskeletal:  Negative for arthralgias, back pain and myalgias.  Skin:  Negative for pallor and rash.  Allergic/Immunologic: Negative  for environmental allergies.  Neurological:  Negative for dizziness, syncope and headaches.       Poor balance  Hematological:  Negative for adenopathy. Does not bruise/bleed easily.  Psychiatric/Behavioral:  Negative for decreased concentration and dysphoric mood. The patient is not nervous/anxious.       Objective:   Physical Exam Constitutional:      General: She is not in acute distress.    Appearance: Normal appearance. She is well-developed and normal weight. She is not ill-appearing or diaphoretic.  HENT:     Head: Normocephalic and atraumatic.     Right Ear: Tympanic membrane, ear canal and external ear normal.     Left Ear: Tympanic membrane, ear canal and external ear normal.     Nose: Nose normal. No congestion.     Mouth/Throat:     Mouth: Mucous membranes are moist.     Pharynx: Oropharynx is clear. No posterior oropharyngeal erythema.  Eyes:     General: No scleral icterus.    Extraocular Movements: Extraocular movements intact.     Conjunctiva/sclera: Conjunctivae normal.     Pupils: Pupils are equal, round, and reactive to light.  Neck:     Thyroid: No thyromegaly.     Vascular: No carotid bruit or JVD.  Cardiovascular:     Rate and Rhythm: Normal rate and regular rhythm.     Pulses: Normal pulses.     Heart sounds: Normal heart sounds.    No gallop.  Pulmonary:     Effort: Pulmonary effort is normal. No respiratory distress.     Breath sounds: Normal breath sounds. No wheezing.     Comments: Good air exch Chest:     Chest wall: No tenderness.  Abdominal:     General: Bowel sounds are normal. There is no distension or abdominal bruit.     Palpations: Abdomen is soft. There is no mass.     Tenderness: There is no abdominal tenderness.     Hernia: No hernia is present.  Genitourinary:    Comments: Breast exam: No mass, nodules, thickening, tenderness, bulging, retraction, inflamation, nipple discharge or skin changes noted.  No axillary or clavicular LA.     R breast has baseline surgical change and nipple inversion from past ca tx  Musculoskeletal:        General: No tenderness. Normal range of motion.     Cervical back: Normal range of motion and neck supple. No rigidity. No muscular tenderness.     Right lower leg: No edema.     Left lower leg: No edema.     Comments: Mild kyphosis   Lymphadenopathy:     Cervical: No cervical adenopathy.  Skin:    General: Skin is warm and dry.     Coloration: Skin is not pale.     Findings: No erythema or rash.     Comments: Solar lentigines diffusely Few sks  Neurological:     Mental Status: She is alert. Mental status is at baseline.     Cranial Nerves: No cranial nerve deficit.     Motor: No abnormal muscle tone.     Coordination: Coordination normal.     Gait: Gait normal.     Deep Tendon Reflexes: Reflexes are normal and symmetric. Reflexes normal.  Psychiatric:  Mood and Affect: Mood normal.        Cognition and Memory: Cognition and memory normal.          Assessment & Plan:   Problem List Items Addressed This Visit       Cardiovascular and Mediastinum   Coronary artery disease of native artery of native heart with stable angina pectoris ([place])    No clinical changes Under care of cardiology  On isosorbide  bp and lipids controlled       Essential hypertension, benign - Primary    bp in fair control at this time  BP Readings from Last 1 Encounters:  05/18/21 135/70  No changes needed Most recent labs reviewed  Disc lifstyle change with low sodium diet and exercise  Plan to continue  Losartan 100 mg daily  Cored 3.125 mg tic Isosorbide 30 mg daily         Musculoskeletal and Integument   Osteoporosis    Pt continues to decline dexa or treatment  No falls or fx  On vit D and level is good  Poor balance  Ref to PT done and disc fall prev      Vaginal itching    Likely yeast Sent diflucan pill  inst to f/u if not imp        Other   B12 deficiency     Lab Results  Component Value Date   VITAMINB12 553 05/11/2021  Shot every 30 day Given today       Great toe pain, right    After trauma/stubbed  Mild bruising at base of medial toes  Xray ordered       Relevant Orders   DG Foot Complete Right   Hyperlipidemia    Disc goals for lipids and reasons to control them Rev last labs with pt Rev low sat fat diet in detail Well controlled with LDL of 65 In setting of CAD Taking crestor 10 mg daily       Iron deficiency anemia    Watched by hematology  Hb of 11 No clinical changes       Poor balance    Pt reported No falls Disc fall prec  Likely multifactorial  Ref to PT       Relevant Orders   Ambulatory referral to Physical Therapy   Prediabetes    Lab Results  Component Value Date   HGBA1C 6.2 05/11/2021  Enc her to cut back on sweet tea disc imp of low glycemic diet and wt loss to prevent DM2       Vitamin D deficiency    Vitamin D level is therapeutic with current supplementation Disc importance of this to bone and overall health  Level in 60s

## 2021-05-18 NOTE — Progress Notes (Signed)
Per orders of Dr. Glori Bickers, injection of B12 1000 mcg given by Pilar Grammes, CMA in Left Deltoid. ?Patient tolerated injection well. ? ?

## 2021-05-18 NOTE — Assessment & Plan Note (Signed)
Watched by hematology  ?Hb of 11 ?No clinical changes  ?

## 2021-05-18 NOTE — Assessment & Plan Note (Signed)
Lab Results  ?Component Value Date  ? HGBA1C 6.2 05/11/2021  ? ?Enc her to cut back on sweet tea ?disc imp of low glycemic diet and wt loss to prevent DM2  ?

## 2021-05-18 NOTE — Assessment & Plan Note (Signed)
After trauma/stubbed  ?Mild bruising at base of medial toes  ?Xray ordered  ?

## 2021-05-18 NOTE — Assessment & Plan Note (Signed)
Disc goals for lipids and reasons to control them ?Rev last labs with pt ?Rev low sat fat diet in detail ?Well controlled with LDL of 65 ?In setting of CAD ?Taking crestor 10 mg daily  ?

## 2021-05-18 NOTE — Progress Notes (Signed)
Hearing Screening - Comments:: Has hearing aids. Wearing them today. ?Vision Screening - Comments:: February 2023 ? ?

## 2021-05-18 NOTE — Assessment & Plan Note (Signed)
No clinical changes ?Under care of cardiology  ?On isosorbide  ?bp and lipids controlled  ?

## 2021-05-23 ENCOUNTER — Ambulatory Visit: Payer: Medicare Other

## 2021-05-23 ENCOUNTER — Encounter: Payer: Self-pay | Admitting: *Deleted

## 2021-05-25 ENCOUNTER — Telehealth: Payer: Self-pay

## 2021-05-25 NOTE — Chronic Care Management (AMB) (Signed)
? ? ?  Chronic Care Management ?Pharmacy Assistant  ? ?Name: Jenna Young  MRN: [id number] DOB: Feb 10, 1932 ? ? ?Reason for Encounter: Reminder Call ?  ?Conditions to be addressed/monitored: ?HTN and HLD ? ? ? ?Medications: ?Outpatient Encounter Medications as of 05/25/2021  ?Medication Sig  ? aspirin 81 MG EC tablet Take 81 mg by mouth daily. Swallow whole.  ? carvedilol (COREG) 3.125 MG tablet TAKE 1 TABLET(3.125 MG) BY MOUTH TWICE DAILY  ? Cholecalciferol (VITAMIN D) 2000 UNITS CAPS Take 1 capsule by mouth daily.  ? clopidogrel (PLAVIX) 75 MG tablet TAKE 1 TABLET(75 MG) BY MOUTH DAILY WITH BREAKFAST  ? cyanocobalamin (,VITAMIN B-12,) 1000 MCG/ML injection Inject 1,000 mcg into the muscle every 30 (thirty) days.  ? isosorbide mononitrate (IMDUR) 30 MG 24 hr tablet TAKE 1/2 TABLET(15 MG) BY MOUTH DAILY  ? losartan (COZAAR) 100 MG tablet Take 1 tablet (100 mg total) by mouth daily.  ? nitroGLYCERIN (NITROSTAT) 0.4 MG SL tablet PLACE 1 TABLET UNDER TONGUE EVERY 5 MIN AS NEEDED FOR CHEST PAIN IF NO RELIEF IN15 MIN CALL 911 (MAX 3 TABS)  ? omeprazole (PRILOSEC) 20 MG capsule TAKE 1 CAPSULE(20 MG) BY MOUTH DAILY AS NEEDED  ? rosuvastatin (CRESTOR) 10 MG tablet TAKE 1 TABLET(10 MG) BY MOUTH DAILY  ? terconazole (TERAZOL 3) 0.8 % vaginal cream Apply to affected vaginal area daily as needed for itching/yeast  ? ?No facility-administered encounter medications on file as of 05/25/2021.  ? ?Payton Moder Veazie was contacted to remind of upcoming telephone visit with Charlene Brooke on 05/30/21 at 3:00pm. Patient was reminded to have any blood glucose and blood pressure readings available for review at appointment. If unable to reach, a voicemail was left for patient.  ? ?No current medication issues to discuss with CPP ? ?Charlene Brooke, CPP notified ? ?Zackariah Vanderpol, CCMA ?Health concierge  ?[phone]  ?

## 2021-05-30 ENCOUNTER — Ambulatory Visit (INDEPENDENT_AMBULATORY_CARE_PROVIDER_SITE_OTHER): Payer: Medicare Other | Admitting: Pharmacist

## 2021-05-30 ENCOUNTER — Other Ambulatory Visit: Payer: Self-pay

## 2021-05-30 DIAGNOSIS — E782 Mixed hyperlipidemia: Secondary | ICD-10-CM

## 2021-05-30 DIAGNOSIS — I10 Essential (primary) hypertension: Secondary | ICD-10-CM

## 2021-05-30 DIAGNOSIS — I25118 Atherosclerotic heart disease of native coronary artery with other forms of angina pectoris: Secondary | ICD-10-CM

## 2021-05-30 DIAGNOSIS — M810 Age-related osteoporosis without current pathological fracture: Secondary | ICD-10-CM

## 2021-05-30 NOTE — Patient Instructions (Signed)
Visit Information ? ?Phone number for Pharmacist: [phone] ? ? Goals Addressed   ?None ?  ? ? ?Care Plan : [place]  ?Updates made by Charlton Haws, RPH since 05/30/2021 12:00 AM  ?  ? ?Problem: Hypertension, Hyperlipidemia, Coronary Artery Disease, and Osteoporosis   ?Priority: High  ?  ? ?Long-Range Goal: Disease Management   ?Start Date: 11/27/2020  ?Expected End Date: 05/31/2022  ?This Visit's Progress: On track  ?Priority: High  ?Note:   ?Current Barriers:  ?None identified ? ?Pharmacist Clinical Goal(s):  ?Patient will contact provider office for questions/concerns as evidenced notation of same in electronic health record through collaboration with PharmD and provider.  ? ?Interventions: ?1:1 collaboration with Tower, [name], MD regarding development and update of comprehensive plan of care as evidenced by provider attestation and co-signature ?Inter-disciplinary care team collaboration (see longitudinal plan of care) ?Comprehensive medication review performed; medication list updated in electronic medical record ? ?Hypertension (BP goal <140/90) ?-Controlled - per office and home readings ?-Current treatment: ?Losartan 100 mg daily - Appropriate, Effective, Safe, Accessible ?Carvedilol 3.125 mg BID - Appropriate, Effective, Safe, Accessible ?Isosorbide [place] 30 mg - 1/2 tablet daily - Appropriate, Effective, Safe, Accessible ?-Medications previously tried: none  ?-Denies hypotensive/hypertensive symptoms. Denies falls. She feels dizzy at times. ?-Educated on BP goals and benefits of medications for prevention of heart attack, stroke and kidney damage; ?-Counseled to monitor BP at home periodically ?-Recommended to continue current medication; Encouraged increase water intake. ? ?Hyperlipidemia/CAD: (LDL goal < 70) ?-Controlled - LDL 65; pt endorses compliance with statin ?-Hx CAD. Follows with Dr Rockey Situ yearly ?-Current treatment: ?Rosuvastatin 10 mg daily - Appropriate, Effective, Safe,  Accessible ?Nitroglycerin 0.4 mg SL PRN (has not needed) -Appropriate, Effective, Safe, Accessible ?Clopidogrel 75 mg daily -Appropriate, Effective, Safe, Accessible ?Aspirin 81 mg daily -Appropriate, Effective, Safe, Accessible ?-Medications previously tried: simvastatin, atorvastatin, rosuvastatin - myalgias ?-Educated on Cholesterol goals;  ?-Recommended to continue current medication ? ?Osteoporosis (Goal prevent fractures) ?-Not ideally controlled - pt has declined further workup, declines medication. ?-Last DEXA Scan: declined. Leg fracture ~2007.  ?-Current treatment  ?Vitamin D 1000 IU ?-Medications previously tried: n/a  ?-Recommend weight-bearing and muscle strengthening exercises for building and maintaining bone density. ? ?Patient Goals/Self-Care Activities ?Patient will:  ?- take medications as prescribed as evidenced by patient report and record review ?focus on medication adherence by routine ?check blood pressure periodically, document, and provide at future appointments ?engage in dietary modifications by drinking less tea, more water ?  ?  ? ?Patient verbalizes understanding of instructions and care plan provided today and agrees to view in [place]. Active MyChart status confirmed with patient.   ? The patient has been provided with contact information for the care management team and has been advised to call with any health related questions or concerns.  ? ?Charlene Brooke, PharmD, BCACP ?Clinical Pharmacist ?[facility] ?[phone] ?  ?

## 2021-05-30 NOTE — Progress Notes (Signed)
? ?Chronic Care Management ?Pharmacy Note ? ?05/30/2021 ?Name:  Jenna Young     MRN:  [id number]  DOB:  [birth year] ? ?Summary: CCM F/U visit ?-Pt endorses compliance with medications as prescribed ?-LDL and BP at goal; A1c 6.2 in prediabetic range, pt is working on drinking less tea and more water ? ?Recommendations/Changes made from today's visit: ?-No med changes ?-Recommend to move patient to CCS (chronic care surveillance) status since she is doing very well ? ?Plan: ? The patient has been provided with contact information for the care management team and has been advised to call with any health related questions or concerns.  ? ? ? ?Subjective: ?Jenna Young is an 88 y.o. year old female who is a primary patient of Tower, [name], MD.  The CCM team was consulted for assistance with disease management and care coordination needs.   ? ?Engaged with patient by telephone for follow up visit in response to provider referral for pharmacy case management and/or care coordination services.  ? ?Consent to Services:  ?The patient was given information about Chronic Care Management services, agreed to services, and gave verbal consent prior to initiation of services.  Please see initial visit note for detailed documentation.  ? ?Patient Care Team: ?Tower, [name], MD as PCP - General ?Minna Merritts, MD as PCP - Cardiology (Cardiology) ?Lloyd Huger, MD as Consulting Physician (Oncology) ?Keymari Sato, Cleaster Cor[place], [facility] as Pharmacist (Pharmacist) ? ?Recent office visits:  ?05/18/21 Dr Glori Bickers OV: annual chronic f/u; declines osteoporosis tx; referred to PT for poor balance. Rx'd terconazole cream for yeast infxn. ? ?03/14/21 NP Tabitha Dugal OV: c/o vaginal itching. Rx'd clotrimazole-betamethosone cream. ? ?Recent consult visits:  ?01/02/21 Dr Rockey Situ (cardiology): f/u CAD. Extra half dose Imdur for BP > 150.  ? ?Hospital visits: ?None in previous 6 months ? ?Objective: ? ?Lab Results  ?Component Value Date  ?  CREATININE 0.91 05/11/2021  ? BUN 12 05/11/2021  ? GFR 56.79 (L) 05/11/2021  ? GFRNONAA >60 05/21/2019  ? GFRAA >60 05/21/2019  ? NA 135 05/11/2021  ? K 4.6 05/11/2021  ? CALCIUM 9.3 05/11/2021  ? CO2 27 05/11/2021  ? GLUCOSE 86 05/11/2021  ? ? ?Lab Results  ?Component Value Date/Time  ? HGBA1C 6.2 05/11/2021 09:45 AM  ? HGBA1C 6.0 05/03/2020 10:44 AM  ? GFR 56.79 (L) 05/11/2021 09:45 AM  ? GFR 63.88 05/03/2020 10:44 AM  ? ?Lab Results  ?Component Value Date  ? CHOL 147 05/11/2021  ? HDL 62.80 05/11/2021  ? [employer] 65 05/11/2021  ? LDLDIRECT 116.6 01/27/2013  ? TRIG 98.0 05/11/2021  ? CHOLHDL 2 05/11/2021  ? ? ? ?  Latest Ref Rng & Units 05/11/2021  ?  9:45 AM 05/03/2020  ? 10:44 AM 05/21/2019  ?  1:48 AM  ?Hepatic Function  ?Total Protein 6.0 - 8.3 g/dL 7.5   7.9   8.8    ?Albumin 3.5 - 5.2 g/dL 4.1   4.1   4.2    ?AST 0 - 37 U/L $Remo'13   13   26    'dBkdj$ ?ALT 0 - 35 U/L $Remo'8   9   15    'wyVWx$ ?Alk Phosphatase 39 - 117 U/L 64   71   73    ?Total Bilirubin 0.2 - 1.2 mg/dL 0.5   0.5   0.8    ? ? ?Lab Results  ?Component Value Date/Time  ? TSH 2.65 05/11/2021 09:45 AM  ? TSH 2.10 05/03/2020 10:44 AM  ? ? ? ?  Latest Ref Rng & Units 05/11/2021  ?  9:45 AM 05/23/2020  ? 12:53 PM 05/03/2020  ? 10:44 AM  ?CBC  ?WBC 4.0 - 10.5 K/uL 4.7   7.3   6.7    ?Hemoglobin 12.0 - 15.0 g/dL 11.0   11.7   11.8    ?Hematocrit 36.0 - 46.0 % 33.9   35.8   35.8    ?Platelets 150.0 - 400.0 K/uL 214.0   249   218.0    ? ? ?Lab Results  ?Component Value Date/Time  ? VD25OH 63.77 05/11/2021 09:45 AM  ? VD25OH 52.48 05/03/2020 10:44 AM  ? ? ?Clinical ASCVD: Yes  ?The ASCVD Risk score (Arnett DK, et al., 2019) failed to calculate for the following reasons: ?  The 2019 ASCVD risk score is only valid for ages 65 to 58 ?  The patient has a prior MI or stroke diagnosis   ? ? ?  05/10/2021  ?  9:04 AM 05/09/2020  ?  8:14 AM 09/20/2019  ?  2:42 PM  ?Depression screen PHQ 2/9  ?Decreased Interest 0 0 0  ?Down, Depressed, Hopeless 0 0 0  ?PHQ - 2 Score 0 0 0  ?Altered sleeping  0 0   ?Tired, decreased energy  0 1  ?Change in appetite  0 0  ?Feeling bad or failure about yourself   0 0  ?Trouble concentrating  0 0  ?Moving slowly or fidgety/restless  0 0  ?Suicidal thoughts  0 0  ?PHQ-9 Score  0 1  ?Difficult doing work/chores  Not difficult at all Not difficult at all  ?  ?Social History  ? ?Tobacco Use  ?Smoking Status Former  ? Types: Cigarettes  ? Quit date: 03/11/1968  ? Years since quitting: 53.2  ?Smokeless Tobacco Never  ? ?BP Readings from Last 3 Encounters:  ?05/18/21 135/70  ?03/14/21 134/72  ?03/01/21 137/62  ? ?Pulse Readings from Last 3 Encounters:  ?05/18/21 62  ?03/14/21 70  ?01/02/21 66  ? ?Wt Readings from Last 3 Encounters:  ?05/18/21 138 lb (62.6 kg)  ?05/10/21 134 lb (60.8 kg)  ?03/14/21 134 lb (60.8 kg)  ? ?BMI Readings from Last 3 Encounters:  ?05/18/21 26.95 kg/m?  ?05/10/21 24.51 kg/m?  ?03/14/21 24.51 kg/m?  ? ? ?Assessment/Interventions: Review of patient past medical history, allergies, medications, health status, including review of consultants reports, laboratory and other test data, was performed as part of comprehensive evaluation and provision of chronic care management services.  ? ?SDOH:  (Social Determinants of Health) assessments and interventions performed: No - assessed per AWV earlier this month 05/2021 ? ? ? ?SDOH Screenings  ? ?Alcohol Screen: Low Risk   ? Last Alcohol Screening Score (AUDIT): 0  ?Depression (PHQ2-9): Low Risk   ? PHQ-2 Score: 0  ?Financial Resource Strain: Low Risk   ? Difficulty of Paying Living Expenses: Not hard at all  ?Food Insecurity: No Food Insecurity  ? Worried About Charity fundraiser in the Last Year: Never true  ? Ran Out of Food in the Last Year: Never true  ?Housing: Low Risk   ? Last Housing Risk Score: 0  ?Physical Activity: Insufficiently Active  ? Days of Exercise per Week: 2 days  ? Minutes of Exercise per Session: 10 min  ?Social Connections: Moderately Integrated  ? Frequency of Communication with Friends and Family:  More than three times a week  ? Frequency of Social Gatherings with Friends and Family: Never  ? Attends Religious Services:  More than 4 times per year  ? Active Member of Clubs or Organizations: Yes  ? Attends Archivist Meetings: More than 4 times per year  ? Marital Status: Widowed  ?Stress: No Stress Concern Present  ? Feeling of Stress : Not at all  ?Tobacco Use: Medium Risk  ? Smoking Tobacco Use: Former  ? Smokeless Tobacco Use: Never  ? Passive Exposure: Not on file  ?Transportation Needs: No Transportation Needs  ? Lack of Transportation (Medical): No  ? Lack of Transportation (Non-Medical): No  ? ? ?CCM Care Plan ? ?Allergies  ?Allergen Reactions  ? Buspirone Hcl   ?  REACTION: HEART PALPITATIONS  ? Codeine   ?  REACTION: nausea and vomiting  ? Hydrochlorothiazide   ?  REACTION: syncope-possibly from dehydration  ? Iron   ?  Oral iron -per pt made her "pass out"   ? Lansoprazole   ?  REACTION: abd pain  ? Minocycline Hcl   ? Penicillins   ?  REACTION: mouth numbness ?Has patient had a PCN reaction causing immediate rash, facial/tongue/throat swelling, SOB or lightheadedness with hypotension: No ?Has patient had a PCN reaction causing severe rash involving mucus membranes or skin necrosis: No ?Has patient had a PCN reaction that required hospitalization: No ?Has patient had a PCN reaction occurring within the last 10 years: No ?If all of the above answers are "NO", then may proceed with Cephalosporin use.  ? Phenergan [Promethazine Hcl] Other (See Comments)  ?  Body shakes, hallucinations  ? Ramipril   ?  Cough ?  ? Ranexa [Ranolazine]   ?  Constipation   ? Sulfa Antibiotics Nausea Only  ? Rosuvastatin   ?  Patient reports muscle pain, unable to take. Stopped around 07/22.  ? ? ?Medications Reviewed Today   ? ? Reviewed by Charlton Haws, RPH (Pharmacist) on 05/30/21 at 1520  Med List Status: <None>  ? ?Medication Order Taking? Sig Documenting Provider Last Dose Status Informant  ?aspirin 81  MG EC tablet [id number] Yes Take 81 mg by mouth daily. Swallow whole. [provider] Taking Active Self  ?carvedilol (COREG) 3.125 MG tablet [id number] Yes TAKE 1 TABLET(3.125 MG) BY MOUTH TWICE

## 2021-05-31 DIAGNOSIS — R2681 Unsteadiness on feet: Secondary | ICD-10-CM | POA: Diagnosis not present

## 2021-05-31 DIAGNOSIS — M6281 Muscle weakness (generalized): Secondary | ICD-10-CM | POA: Diagnosis not present

## 2021-06-05 DIAGNOSIS — H524 Presbyopia: Secondary | ICD-10-CM | POA: Diagnosis not present

## 2021-06-05 DIAGNOSIS — H02055 Trichiasis without entropian left lower eyelid: Secondary | ICD-10-CM | POA: Diagnosis not present

## 2021-06-05 DIAGNOSIS — H5203 Hypermetropia, bilateral: Secondary | ICD-10-CM | POA: Diagnosis not present

## 2021-06-05 DIAGNOSIS — H43813 Vitreous degeneration, bilateral: Secondary | ICD-10-CM | POA: Diagnosis not present

## 2021-06-05 DIAGNOSIS — Z961 Presence of intraocular lens: Secondary | ICD-10-CM | POA: Diagnosis not present

## 2021-06-05 DIAGNOSIS — H52223 Regular astigmatism, bilateral: Secondary | ICD-10-CM | POA: Diagnosis not present

## 2021-06-05 DIAGNOSIS — H16142 Punctate keratitis, left eye: Secondary | ICD-10-CM | POA: Diagnosis not present

## 2021-06-06 DIAGNOSIS — R2681 Unsteadiness on feet: Secondary | ICD-10-CM | POA: Diagnosis not present

## 2021-06-06 DIAGNOSIS — M6281 Muscle weakness (generalized): Secondary | ICD-10-CM | POA: Diagnosis not present

## 2021-06-08 DIAGNOSIS — E782 Mixed hyperlipidemia: Secondary | ICD-10-CM

## 2021-06-08 DIAGNOSIS — I25118 Atherosclerotic heart disease of native coronary artery with other forms of angina pectoris: Secondary | ICD-10-CM

## 2021-06-08 DIAGNOSIS — R2681 Unsteadiness on feet: Secondary | ICD-10-CM | POA: Diagnosis not present

## 2021-06-08 DIAGNOSIS — M6281 Muscle weakness (generalized): Secondary | ICD-10-CM | POA: Diagnosis not present

## 2021-06-08 DIAGNOSIS — M810 Age-related osteoporosis without current pathological fracture: Secondary | ICD-10-CM | POA: Diagnosis not present

## 2021-06-08 DIAGNOSIS — I10 Essential (primary) hypertension: Secondary | ICD-10-CM

## 2021-06-11 DIAGNOSIS — R2681 Unsteadiness on feet: Secondary | ICD-10-CM | POA: Diagnosis not present

## 2021-06-11 DIAGNOSIS — M6281 Muscle weakness (generalized): Secondary | ICD-10-CM | POA: Diagnosis not present

## 2021-06-13 DIAGNOSIS — R2681 Unsteadiness on feet: Secondary | ICD-10-CM | POA: Diagnosis not present

## 2021-06-13 DIAGNOSIS — M6281 Muscle weakness (generalized): Secondary | ICD-10-CM | POA: Diagnosis not present

## 2021-06-18 DIAGNOSIS — R2681 Unsteadiness on feet: Secondary | ICD-10-CM | POA: Diagnosis not present

## 2021-06-18 DIAGNOSIS — M6281 Muscle weakness (generalized): Secondary | ICD-10-CM | POA: Diagnosis not present

## 2021-06-20 ENCOUNTER — Ambulatory Visit (INDEPENDENT_AMBULATORY_CARE_PROVIDER_SITE_OTHER): Payer: Medicare Other

## 2021-06-20 DIAGNOSIS — E538 Deficiency of other specified B group vitamins: Secondary | ICD-10-CM

## 2021-06-20 MED ORDER — CYANOCOBALAMIN 1000 MCG/ML IJ SOLN
1000.0000 ug | Freq: Once | INTRAMUSCULAR | Status: AC
  Administered 2021-06-20: 1000 ug via INTRAMUSCULAR

## 2021-06-20 NOTE — Progress Notes (Signed)
Per orders of Dr. Tower,monthly injection of B12 given by Caralee Morea G Webber Michiels. Patient tolerated injection well.  

## 2021-06-21 DIAGNOSIS — R2681 Unsteadiness on feet: Secondary | ICD-10-CM | POA: Diagnosis not present

## 2021-06-21 DIAGNOSIS — M6281 Muscle weakness (generalized): Secondary | ICD-10-CM | POA: Diagnosis not present

## 2021-06-25 DIAGNOSIS — R2681 Unsteadiness on feet: Secondary | ICD-10-CM | POA: Diagnosis not present

## 2021-06-25 DIAGNOSIS — M6281 Muscle weakness (generalized): Secondary | ICD-10-CM | POA: Diagnosis not present

## 2021-06-27 ENCOUNTER — Inpatient Hospital Stay
Admission: EM | Admit: 2021-06-27 | Discharge: 2021-07-02 | DRG: 522 | Disposition: A | Discharge status: Skilled Nursing Facility | Payer: Medicare Other | Attending: Internal Medicine | Admitting: Internal Medicine

## 2021-06-27 ENCOUNTER — Emergency Department: Payer: Medicare Other

## 2021-06-27 ENCOUNTER — Other Ambulatory Visit: Payer: Self-pay

## 2021-06-27 ENCOUNTER — Encounter: Payer: Self-pay | Admitting: Emergency Medicine

## 2021-06-27 DIAGNOSIS — Z87891 Personal history of nicotine dependence: Secondary | ICD-10-CM

## 2021-06-27 DIAGNOSIS — I517 Cardiomegaly: Secondary | ICD-10-CM | POA: Diagnosis not present

## 2021-06-27 DIAGNOSIS — S72002A Fracture of unspecified part of neck of left femur, initial encounter for closed fracture: Secondary | ICD-10-CM | POA: Diagnosis not present

## 2021-06-27 DIAGNOSIS — W19XXXA Unspecified fall, initial encounter: Secondary | ICD-10-CM

## 2021-06-27 DIAGNOSIS — S199XXA Unspecified injury of neck, initial encounter: Secondary | ICD-10-CM | POA: Diagnosis not present

## 2021-06-27 DIAGNOSIS — J984 Other disorders of lung: Secondary | ICD-10-CM | POA: Diagnosis not present

## 2021-06-27 DIAGNOSIS — Z880 Allergy status to penicillin: Secondary | ICD-10-CM

## 2021-06-27 DIAGNOSIS — Z7982 Long term (current) use of aspirin: Secondary | ICD-10-CM

## 2021-06-27 DIAGNOSIS — E785 Hyperlipidemia, unspecified: Secondary | ICD-10-CM | POA: Diagnosis present

## 2021-06-27 DIAGNOSIS — I10 Essential (primary) hypertension: Secondary | ICD-10-CM | POA: Diagnosis present

## 2021-06-27 DIAGNOSIS — Z888 Allergy status to other drugs, medicaments and biological substances status: Secondary | ICD-10-CM

## 2021-06-27 DIAGNOSIS — Z882 Allergy status to sulfonamides status: Secondary | ICD-10-CM

## 2021-06-27 DIAGNOSIS — W010XXA Fall on same level from slipping, tripping and stumbling without subsequent striking against object, initial encounter: Secondary | ICD-10-CM | POA: Diagnosis present

## 2021-06-27 DIAGNOSIS — S72012A Unspecified intracapsular fracture of left femur, initial encounter for closed fracture: Secondary | ICD-10-CM | POA: Diagnosis not present

## 2021-06-27 DIAGNOSIS — I447 Left bundle-branch block, unspecified: Secondary | ICD-10-CM | POA: Diagnosis present

## 2021-06-27 DIAGNOSIS — I2510 Atherosclerotic heart disease of native coronary artery without angina pectoris: Secondary | ICD-10-CM | POA: Diagnosis present

## 2021-06-27 DIAGNOSIS — E871 Hypo-osmolality and hyponatremia: Secondary | ICD-10-CM | POA: Diagnosis present

## 2021-06-27 DIAGNOSIS — M810 Age-related osteoporosis without current pathological fracture: Secondary | ICD-10-CM | POA: Diagnosis present

## 2021-06-27 DIAGNOSIS — I739 Peripheral vascular disease, unspecified: Secondary | ICD-10-CM | POA: Diagnosis not present

## 2021-06-27 DIAGNOSIS — Z8249 Family history of ischemic heart disease and other diseases of the circulatory system: Secondary | ICD-10-CM

## 2021-06-27 DIAGNOSIS — Z043 Encounter for examination and observation following other accident: Secondary | ICD-10-CM | POA: Diagnosis not present

## 2021-06-27 DIAGNOSIS — K219 Gastro-esophageal reflux disease without esophagitis: Secondary | ICD-10-CM | POA: Diagnosis present

## 2021-06-27 DIAGNOSIS — S72009A Fracture of unspecified part of neck of unspecified femur, initial encounter for closed fracture: Secondary | ICD-10-CM | POA: Diagnosis present

## 2021-06-27 DIAGNOSIS — J329 Chronic sinusitis, unspecified: Secondary | ICD-10-CM | POA: Diagnosis not present

## 2021-06-27 DIAGNOSIS — E7849 Other hyperlipidemia: Secondary | ICD-10-CM

## 2021-06-27 DIAGNOSIS — Z7902 Long term (current) use of antithrombotics/antiplatelets: Secondary | ICD-10-CM

## 2021-06-27 DIAGNOSIS — S0990XA Unspecified injury of head, initial encounter: Secondary | ICD-10-CM | POA: Diagnosis not present

## 2021-06-27 DIAGNOSIS — Z853 Personal history of malignant neoplasm of breast: Secondary | ICD-10-CM

## 2021-06-27 DIAGNOSIS — Z79899 Other long term (current) drug therapy: Secondary | ICD-10-CM

## 2021-06-27 DIAGNOSIS — Y92480 Sidewalk as the place of occurrence of the external cause: Secondary | ICD-10-CM

## 2021-06-27 DIAGNOSIS — Z923 Personal history of irradiation: Secondary | ICD-10-CM

## 2021-06-27 DIAGNOSIS — R52 Pain, unspecified: Secondary | ICD-10-CM | POA: Diagnosis not present

## 2021-06-27 LAB — COMPREHENSIVE METABOLIC PANEL
ALT: 17 U/L (ref 0–44)
AST: 25 U/L (ref 15–41)
Albumin: 4.1 g/dL (ref 3.5–5.0)
Alkaline Phosphatase: 66 U/L (ref 38–126)
Anion gap: 10 (ref 5–15)
BUN: 12 mg/dL (ref 8–23)
CO2: 22 mmol/L (ref 22–32)
Calcium: 9.3 mg/dL (ref 8.9–10.3)
Chloride: 101 mmol/L (ref 98–111)
Creatinine, Ser: 0.92 mg/dL (ref 0.44–1.00)
GFR, Estimated: >60 mL/min (ref >60)
Glucose, Bld: 117 mg/dL — ABNORMAL HIGH (ref 70–99)
Potassium: 3.8 mmol/L (ref 3.5–5.1)
Sodium: 133 mmol/L — ABNORMAL LOW (ref 135–145)
Total Bilirubin: 0.7 mg/dL (ref 0.3–1.2)
Total Protein: 8.1 g/dL (ref 6.5–8.1)

## 2021-06-27 LAB — CBC WITH DIFFERENTIAL/PLATELET
Abs Immature Granulocytes: 0.06 10*3/uL (ref 0.00–0.07)
Basophils Absolute: 0 10*3/uL (ref 0.0–0.1)
Basophils Relative: 0 %
Eosinophils Absolute: 0.1 10*3/uL (ref 0.0–0.5)
Eosinophils Relative: 1 %
HCT: 36.2 % (ref 36.0–46.0)
Hemoglobin: 11.5 g/dL — ABNORMAL LOW (ref 12.0–15.0)
Immature Granulocytes: 1 %
Lymphocytes Relative: 18 %
Lymphs Abs: 1.6 10*3/uL (ref 0.7–4.0)
MCH: 26.4 pg (ref 26.0–34.0)
MCHC: 31.8 g/dL (ref 30.0–36.0)
MCV: 83.2 fL (ref 80.0–100.0)
Monocytes Absolute: 0.5 10*3/uL (ref 0.1–1.0)
Monocytes Relative: 6 %
Neutro Abs: 6.9 10*3/uL (ref 1.7–7.7)
Neutrophils Relative %: 74 %
Platelets: 195 10*3/uL (ref 150–400)
RBC: 4.35 MIL/uL (ref 3.87–5.11)
RDW: 15.7 % — ABNORMAL HIGH (ref 11.5–15.5)
WBC: 9.2 10*3/uL (ref 4.0–10.5)
nRBC: 0 % (ref 0.0–0.2)

## 2021-06-27 MED ORDER — DOCUSATE SODIUM 100 MG PO CAPS
200.0000 mg | ORAL_CAPSULE | Freq: Two times a day (BID) | ORAL | Status: DC
  Administered 2021-06-28 – 2021-07-02 (×7): 200 mg via ORAL
  Filled 2021-06-27 (×9): qty 2

## 2021-06-27 MED ORDER — HYDRALAZINE HCL 20 MG/ML IJ SOLN: 20.0000 mg | Freq: Four times a day (QID) | INTRAMUSCULAR | Status: DC | PRN

## 2021-06-27 MED ORDER — ISOSORBIDE MONONITRATE ER 30 MG PO TB24
15.0000 mg | ORAL_TABLET | Freq: Every day | ORAL | Status: DC
  Administered 2021-06-29 – 2021-07-02 (×4): 15 mg via ORAL
  Filled 2021-06-27 (×4): qty 1

## 2021-06-27 MED ORDER — ONDANSETRON HCL 4 MG/2ML IJ SOLN
4.0000 mg | Freq: Four times a day (QID) | INTRAMUSCULAR | Status: DC | PRN
  Administered 2021-06-27 – 2021-06-28 (×2): 4 mg via INTRAVENOUS
  Filled 2021-06-27 (×2): qty 2

## 2021-06-27 MED ORDER — HYDROCODONE-ACETAMINOPHEN 5-325 MG PO TABS
1.0000 | ORAL_TABLET | Freq: Four times a day (QID) | ORAL | Status: DC | PRN
  Administered 2021-06-27 – 2021-06-29 (×3): 2 via ORAL
  Filled 2021-06-27 (×3): qty 2

## 2021-06-27 MED ORDER — POLYETHYLENE GLYCOL 3350 17 G PO PACK
17.0000 g | PACK | Freq: Every day | ORAL | Status: DC | PRN
  Administered 2021-07-01 – 2021-07-02 (×2): 17 g via ORAL
  Filled 2021-06-27 (×2): qty 1

## 2021-06-27 MED ORDER — LOSARTAN POTASSIUM 50 MG PO TABS
100.0000 mg | ORAL_TABLET | Freq: Every day | ORAL | Status: DC
  Administered 2021-06-29 – 2021-07-02 (×4): 100 mg via ORAL
  Filled 2021-06-27 (×4): qty 2

## 2021-06-27 MED ORDER — MORPHINE SULFATE (PF) 2 MG/ML IV SOLN
1.0000 mg | INTRAVENOUS | Status: DC | PRN
  Administered 2021-06-27 – 2021-06-28 (×5): 1 mg via INTRAVENOUS
  Filled 2021-06-27 (×5): qty 1

## 2021-06-27 MED ORDER — CARVEDILOL 3.125 MG PO TABS
3.1250 mg | ORAL_TABLET | Freq: Two times a day (BID) | ORAL | Status: DC
  Administered 2021-06-27 – 2021-07-02 (×7): 3.125 mg via ORAL
  Filled 2021-06-27 (×8): qty 1

## 2021-06-27 MED ORDER — SODIUM CHLORIDE 0.9 % IV SOLN: INTRAVENOUS | Status: DC

## 2021-06-27 MED ORDER — PANTOPRAZOLE SODIUM 40 MG PO TBEC
40.0000 mg | DELAYED_RELEASE_TABLET | Freq: Every day | ORAL | Status: DC
  Administered 2021-06-29 – 2021-07-02 (×4): 40 mg via ORAL
  Filled 2021-06-27 (×4): qty 1

## 2021-06-27 MED ORDER — SODIUM CHLORIDE 0.9 % IV BOLUS
500.0000 mL | Freq: Once | INTRAVENOUS | Status: AC
  Administered 2021-06-27: 500 mL via INTRAVENOUS

## 2021-06-27 NOTE — H&P (Signed)
?History and Physical  ? ? ?Quenisha Lovins Young BPZ:[id number] DOB: [birth year] DOA: 06/27/2021 ? ?PCP: Abner Greenspan, MD  ?[facility] ? ? ? ?Chief Complaint: left hip pain  ? ?HPI: 88 y/o F w/ PMH of HTN, HLD, CAD, GERD, breast cancer (in remission for at least 5 years) who [facility]. Pt stated she tripped over the sidewalk and fell on left hip. Pt denies any proceeding symptoms. Pt c/o left hip that is dull, constant w/o radiation. Moving her leg makes the pain and nothing makes the pain better. The severity is currently 6/10. Pt does not currently want pain medication. Pt denies any fevers, chills, sweating, cough, chest pain, shortness of breath, nausea, vomiting, abd pain, diarrhea, or constipation.  ? ?Review of Systems: As per HPI otherwise 14 point review of systems negative.  ? ? ?Past Medical History:  ?Diagnosis Date  ? Allergic rhinitis   ? Anemia   ? Anxiety   ? GERD (gastroesophageal reflux disease)   ? History of radiation therapy 04/29/16- 05/24/16  ? Left Breast 40.05 Gy in 15 fractions, Left Breast boost 10 Gy in 5 fractions.   ? Hyperlipidemia   ? Hypertension   ? treated by Dr Thurnell Garbe recently with syncope episode  ? Osteopenia   ? Osteoporosis   ? Personal history of radiation therapy 2018  ? ? ?Past Surgical History:  ?Procedure Laterality Date  ? APPENDECTOMY    ? BREAST BIOPSY Left 11/29/2008  ? BREAST BIOPSY Left 01/26/2016  ? malignant  ? BREAST EXCISIONAL BIOPSY Left 12/26/2008  ? BREAST LUMPECTOMY Left 02/27/2016  ? BREAST LUMPECTOMY Left 04/01/2016  ? BREAST LUMPECTOMY WITH RADIOACTIVE SEED LOCALIZATION Left 02/27/2016  ? Procedure: LEFT BREAST LUMPECTOMY WITH RADIOACTIVE SEED LOCALIZATION;  Surgeon: Autumn Messing III, MD;  Location: [place];  Service: General;  Laterality: Left;  ? BREAST SURGERY  10/10  ? benign biopsy  negative  ? CESAREAN SECTION    ? CHOLECYSTECTOMY    ? CORONARY STENT INTERVENTION N/A 02/11/2017  ? Procedure: CORONARY STENT  INTERVENTION;  Surgeon: Nelva Bush, MD;  Location: [place] [facility];  Service: Cardiovascular;  Laterality: N/A;  ? FEMUR FRACTURE SURGERY Right 2006  ? LEFT HEART CATH AND CORONARY ANGIOGRAPHY N/A 02/11/2017  ? Procedure: LEFT HEART CATH AND CORONARY ANGIOGRAPHY;  Surgeon: Nelva Bush, MD;  Location: [place] [facility];  Service: Cardiovascular;  Laterality: N/A;  ? LEFT HEART CATH AND CORONARY ANGIOGRAPHY N/A 05/21/2019  ? Procedure: LEFT HEART CATH AND CORONARY ANGIOGRAPHY;  Surgeon: Wellington Hampshire, MD;  Location: [place] [facility];  Service: Cardiovascular;  Laterality: N/A;  ? OVARIAN CYST SURGERY    ? RE-EXCISION OF BREAST LUMPECTOMY Left 04/01/2016  ? Procedure: RE-EXCISION OF LEFT BREAST MEDIAL MARGIN;  Surgeon: Autumn Messing III, MD;  Location: [place];  Service: General;  Laterality: Left;  ? ? ? reports that she quit smoking about 53 years ago. Her smoking use included cigarettes. She has never used smokeless tobacco. She reports that she does not drink alcohol and does not use drugs. ? ?Allergies  ?Allergen Reactions  ? Buspirone Hcl   ?  REACTION: HEART PALPITATIONS  ? Codeine   ?  REACTION: nausea and vomiting  ? Hydrochlorothiazide   ?  REACTION: syncope-possibly from dehydration  ? Iron   ?  Oral iron -per pt made her "pass out"   ? Lansoprazole   ?  REACTION: abd pain  ?  Minocycline Hcl   ? Penicillins   ?  REACTION: mouth numbness ?Has patient had a PCN reaction causing immediate rash, facial/tongue/throat swelling, SOB or lightheadedness with hypotension: No ?Has patient had a PCN reaction causing severe rash involving mucus membranes or skin necrosis: No ?Has patient had a PCN reaction that required hospitalization: No ?Has patient had a PCN reaction occurring within the last 10 years: No ?If all of the above answers are "NO", then may proceed with Cephalosporin use.  ? Phenergan [Promethazine Hcl] Other (See Comments)  ?  Body shakes, hallucinations  ? Ramipril    ?  Cough ?  ? Ranexa [Ranolazine]   ?  Constipation   ? Sulfa Antibiotics Nausea Only  ? Rosuvastatin   ?  Patient reports muscle pain, unable to take. Stopped around 07/22.  ? ? ?Family History  ?Problem Relation Age of Onset  ? Hypertension Mother   ? Heart disease Father   ? Macular degeneration Sister   ? Diabetes Maternal Aunt   ? Cancer Maternal Aunt   ?     throat  ? ? ?Prior to Admission medications   ?Medication Sig Start Date End Date Taking? Authorizing Provider  ?aspirin 81 MG EC tablet Take 81 mg by mouth daily. Swallow whole.    [provider]  ?carvedilol (COREG) 3.125 MG tablet TAKE 1 TABLET(3.125 MG) BY MOUTH TWICE DAILY 03/23/21   Minna Merritts, MD  ?Cholecalciferol (VITAMIN D) 2000 UNITS CAPS Take 1 capsule by mouth daily.    [provider]  ?clopidogrel (PLAVIX) 75 MG tablet TAKE 1 TABLET(75 MG) BY MOUTH DAILY WITH BREAKFAST 05/18/21   Tower, [name], MD  ?cyanocobalamin (,VITAMIN B-12,) 1000 MCG/ML injection Inject 1,000 mcg into the muscle every 30 (thirty) days.    [provider]  ?isosorbide mononitrate (IMDUR) 30 MG 24 hr tablet TAKE 1/2 TABLET(15 MG) BY MOUTH DAILY 05/15/21   Minna Merritts, MD  ?losartan (COZAAR) 100 MG tablet Take 1 tablet (100 mg total) by mouth daily. 01/08/21   Minna Merritts, MD  ?nitroGLYCERIN (NITROSTAT) 0.4 MG SL tablet PLACE 1 TABLET UNDER TONGUE EVERY 5 MIN AS NEEDED FOR CHEST PAIN IF NO RELIEF IN15 MIN CALL 911 (MAX 3 TABS) 12/29/18   Gollan, Kathlene November, MD  ?omeprazole (PRILOSEC) 20 MG capsule TAKE 1 CAPSULE(20 MG) BY MOUTH DAILY AS NEEDED 03/24/21   Tower, [name], MD  ?rosuvastatin (CRESTOR) 10 MG tablet TAKE 1 TABLET(10 MG) BY MOUTH DAILY 03/23/21   Minna Merritts, MD  ?terconazole (TERAZOL 3) 0.8 % vaginal cream Apply to affected vaginal area daily as needed for itching/yeast 05/18/21   Tower, [name], MD  ? ? ?Physical Exam: ?Vitals:  ? 06/27/21 1515 06/27/21 1605  ?BP:  (!) 170/66  ?Pulse:  64  ?Resp:  14  ?Temp:  98.1  ?F (36.7 ?C)  ?TempSrc:  Oral  ?SpO2:  94%  ?Weight: 62.6 kg   ?Height: 5' (1.524 m)   ? ? ?Constitutional: NAD, calm, comfortable ?Vitals:  ? 06/27/21 1515 06/27/21 1605  ?BP:  (!) 170/66  ?Pulse:  64  ?Resp:  14  ?Temp:  98.1 ?F (36.7 ?C)  ?TempSrc:  Oral  ?SpO2:  94%  ?Weight: 62.6 kg   ?Height: 5' (1.524 m)   ? ?Eyes: PERRL, lids and conjunctivae normal ?ENMT: Mucous membranes are moist. ?Neck: normal, supple ?Respiratory: clear to auscultation bilaterally, no wheezing, no crackles. Normal respiratory effort. No accessory muscle use.  ?Cardiovascular: Regular rate  and rhythm, no rubs / gallops. B/l LE edema ?Abdomen: soft, no tenderness, no distended. Bowel sounds positive.  ?Musculoskeletal: no clubbing / cyanosis. Moves all extremities   ?Skin: no rashes, lesions ?Neurologic: CN 2-12 grossly intact. Decreased strength in b/l LE  ?Psychiatric: Normal judgment and insight. Alert and oriented x 3. Normal mood.  ? ? ?Labs on Admission: I have personally reviewed following labs and imaging studies ? ?CBC: ?No results for input(s): WBC, NEUTROABS, HGB, HCT, MCV, PLT in the last 168 hours. ?Basic Metabolic Panel: ?No results for input(s): NA, K, CL, CO2, GLUCOSE, BUN, CREATININE, CALCIUM, MG, PHOS in the last 168 hours. ?GFR: ?CrCl cannot be calculated (Patient's most recent lab result is older than the maximum 21 days allowed.). ?Liver Function Tests: ?No results for input(s): AST, ALT, ALKPHOS, BILITOT, PROT, ALBUMIN in the last 168 hours. ?No results for input(s): LIPASE, AMYLASE in the last 168 hours. ?No results for input(s): AMMONIA in the last 168 hours. ?Coagulation Profile: ?No results for input(s): INR, PROTIME in the last 168 hours. ?Cardiac Enzymes: ?No results for input(s): CKTOTAL, CKMB, CKMBINDEX, TROPONINI in the last 168 hours. ?BNP (last 3 results) ?No results for input(s): PROBNP in the last 8760 hours. ?HbA1C: ?No results for input(s): HGBA1C in the last 72 hours. ?CBG: ?No results for input(s):  GLUCAP in the last 168 hours. ?Lipid Profile: ?No results for input(s): CHOL, HDL, LDLCALC, TRIG, CHOLHDL, LDLDIRECT in the last 72 hours. ?Thyroid Function Tests: ?No results for input(s): TSH, T4TO

## 2021-06-27 NOTE — ED Notes (Signed)
Says today she was stepping up  on curb and lost balance.  Fell on left buttocks and that is waht is hurting now.  She is alert and not in distress, but needed much assistance to get from wheelchair to bed. Good pedal pulse.  She has her foot outwardly rotated.  She was able to get it straightened, but says it hurts.   ?

## 2021-06-27 NOTE — ED Triage Notes (Signed)
Pt comes into the ED via [facility] from [facility] where she had a mechanical fall.  Pt states she lost her balance and landed on the left side of her buttocks.  Pt in NAD at this time with even and unlabored respirations. Denies hitting her head, denies any blood thinners. Pt denies any other pain, other than her buttocks.  ?

## 2021-06-27 NOTE — ED Provider Notes (Signed)
? ?[facility] ?Provider Note ? ? ? Event Date/Time  ? First MD Initiated Contact with Patient 06/27/21 1600   ?  (approximate) ? ? ?History  ? ?Fall ? ? ?HPI ? ?Jenna Young is a 88 y.o. female with history of osteopenia, osteoporosis, hypertension and GERD presents emergency department after stepping up on a curb and losing her balance causing her to fall onto her bottom.  Patient states she try to catch herself with her right arm.  No right arm injury.  No LOC.  No vomiting.  Denies chest pain or shortness of breath.  Is only complaining of left buttock pain ? ?  ? ? ?Physical Exam  ? ?Triage Vital Signs: ?ED Triage Vitals  ?Enc Vitals Group  ?   BP 06/27/21 1605 (!) 170/66  ?   Pulse Rate 06/27/21 1605 64  ?   Resp 06/27/21 1605 14  ?   Temp 06/27/21 1605 98.1 ?F (36.7 ?C)  ?   Temp Source 06/27/21 1605 Oral  ?   SpO2 06/27/21 1605 94 %  ?   Weight 06/27/21 1515 138 lb 0.1 oz (62.6 kg)  ?   Height 06/27/21 1515 5' (1.524 m)  ?   Head Circumference --   ?   Peak Flow --   ?   Pain Score 06/27/21 1515 4  ?   Pain Loc --   ?   Pain Edu? --   ?   Excl. in [place]? --   ? ? ?Most recent vital signs: ?Vitals:  ? 06/27/21 1605  ?BP: (!) 170/66  ?Pulse: 64  ?Resp: 14  ?Temp: 98.1 ?F (36.7 ?C)  ?SpO2: 94%  ? ? ? ?General: Awake, no distress.   ?CV:  Good peripheral perfusion. regular rate and  rhythm ?Resp:  Normal effort. Lungs CTA ?Abd:  No distention.   ?Other:  C-spine minimally tender, left hip tender posteriorly, leg is rotated out, neurovascular is intact ? ? ?ED Results / Procedures / Treatments  ? ?Labs ?(all labs ordered are listed, but only abnormal results are displayed) ?Labs Reviewed  ?COMPREHENSIVE METABOLIC PANEL - Abnormal; Notable for the following components:  ?    Result Value  ? Sodium 133 (*)   ? Glucose, Bld 117 (*)   ? All other components within normal limits  ?CBC WITH DIFFERENTIAL/PLATELET - Abnormal; Notable for the following components:  ? Hemoglobin 11.5 (*)   ? RDW  15.7 (*)   ? All other components within normal limits  ?URINALYSIS, ROUTINE W REFLEX MICROSCOPIC  ?CBC  ?BASIC METABOLIC PANEL  ? ? ? ?EKG ? ? ? ? ?RADIOLOGY ?CT of the head and C-spine, x-ray of the left hip ? ? ? ?PROCEDURES: ? ? ?Procedures ? ? ?MEDICATIONS ORDERED IN ED: ?Medications  ?HYDROcodone-acetaminophen (NORCO/VICODIN) 5-325 MG per tablet 1-2 tablet (has no administration in time range)  ?morphine (PF) 2 MG/ML injection 1 mg (has no administration in time range)  ?polyethylene glycol (MIRALAX / GLYCOLAX) packet 17 g (has no administration in time range)  ?ondansetron (ZOFRAN) injection 4 mg (has no administration in time range)  ?carvedilol (COREG) tablet 3.125 mg (has no administration in time range)  ?losartan (COZAAR) tablet 100 mg (has no administration in time range)  ?isosorbide mononitrate (IMDUR) 24 hr tablet 15 mg (has no administration in time range)  ?docusate sodium (COLACE) capsule 200 mg (has no administration in time range)  ?hydrALAZINE (APRESOLINE) injection 20 mg (has no administration in time range)  ?  pantoprazole (PROTONIX) EC tablet 40 mg (has no administration in time range)  ?0.9 %  sodium chloride infusion (has no administration in time range)  ?sodium chloride 0.9 % bolus 500 mL (500 mLs Intravenous New Bag/Given 06/27/21 1722)  ? ? ? ?IMPRESSION / MDM / ASSESSMENT AND PLAN / ED COURSE  ?I reviewed the triage vital signs and the nursing notes. ?             ?               ? ?Differential diagnosis includes, but is not limited to, left hip fracture, C-spine fracture, subdural, subarachnoid ? ?CT of the head and C-spine due to the fall, x-ray of the left hip due to fall and pain. ? ? ?CT of the head and C-spine independently reviewed by me and confirmed by radiology to be negative for any acute abnormality ? ?X-ray of the left hip was independently reviewed by me and does show a femoral neck fracture. ? ?Consult to orthopedics, Dr. Sharlet Salina states to leave n.p.o. after midnight we  will do surgery tomorrow. ? ?Consult hospitalist for admission ? ?Labs ordered, EKG ordered for preop ? ?CBC is normal.  Comprehensive metabolic panel is normal. ? ?Spoke with Dr. Jimmye Norman.  Admitting the patient for fractured hip.  I did express to her that Dr. Sharlet Salina would like the patient to be n.p.o. after midnight.  Patient and hospitalist in agreement treatment plan.  She is in stable condition at this time ? ? ? ? ?  ? ? ?FINAL CLINICAL IMPRESSION(S) / ED DIAGNOSES  ? ?Final diagnoses:  ?Closed left hip fracture, initial encounter ([place])  ?Fall, initial encounter  ? ? ? ?Rx / DC Orders  ? ?ED Discharge Orders   ? ? None  ? ?  ? ? ? ?Note:  This document was prepared using Dragon voice recognition software and may include unintentional dictation errors. ? ?  ?Versie Starks, PA-C ?06/27/21 1801 ? ?  ?Blake Divine, MD ?06/27/21 2336 ? ?

## 2021-06-28 ENCOUNTER — Encounter: Payer: Self-pay | Admitting: Internal Medicine

## 2021-06-28 ENCOUNTER — Inpatient Hospital Stay: Payer: Medicare Other

## 2021-06-28 ENCOUNTER — Other Ambulatory Visit: Payer: Self-pay

## 2021-06-28 ENCOUNTER — Inpatient Hospital Stay: Payer: Medicare Other | Admitting: Anesthesiology

## 2021-06-28 ENCOUNTER — Encounter
Admission: EM | Disposition: A | Discharge status: Skilled Nursing Facility | Payer: Self-pay | Source: Home / Self Care | Attending: Internal Medicine

## 2021-06-28 DIAGNOSIS — R29898 Other symptoms and signs involving the musculoskeletal system: Secondary | ICD-10-CM | POA: Diagnosis not present

## 2021-06-28 DIAGNOSIS — E538 Deficiency of other specified B group vitamins: Secondary | ICD-10-CM | POA: Diagnosis not present

## 2021-06-28 DIAGNOSIS — M810 Age-related osteoporosis without current pathological fracture: Secondary | ICD-10-CM | POA: Diagnosis not present

## 2021-06-28 DIAGNOSIS — Z7401 Bed confinement status: Secondary | ICD-10-CM | POA: Diagnosis not present

## 2021-06-28 DIAGNOSIS — S72002A Fracture of unspecified part of neck of left femur, initial encounter for closed fracture: Secondary | ICD-10-CM | POA: Diagnosis not present

## 2021-06-28 DIAGNOSIS — D649 Anemia, unspecified: Secondary | ICD-10-CM | POA: Diagnosis not present

## 2021-06-28 DIAGNOSIS — Z888 Allergy status to other drugs, medicaments and biological substances status: Secondary | ICD-10-CM | POA: Diagnosis not present

## 2021-06-28 DIAGNOSIS — F411 Generalized anxiety disorder: Secondary | ICD-10-CM | POA: Diagnosis not present

## 2021-06-28 DIAGNOSIS — Z7902 Long term (current) use of antithrombotics/antiplatelets: Secondary | ICD-10-CM | POA: Diagnosis not present

## 2021-06-28 DIAGNOSIS — Z87891 Personal history of nicotine dependence: Secondary | ICD-10-CM | POA: Diagnosis not present

## 2021-06-28 DIAGNOSIS — E785 Hyperlipidemia, unspecified: Secondary | ICD-10-CM | POA: Diagnosis present

## 2021-06-28 DIAGNOSIS — K5900 Constipation, unspecified: Secondary | ICD-10-CM | POA: Diagnosis not present

## 2021-06-28 DIAGNOSIS — Z853 Personal history of malignant neoplasm of breast: Secondary | ICD-10-CM | POA: Diagnosis not present

## 2021-06-28 DIAGNOSIS — K219 Gastro-esophageal reflux disease without esophagitis: Secondary | ICD-10-CM | POA: Diagnosis not present

## 2021-06-28 DIAGNOSIS — Z880 Allergy status to penicillin: Secondary | ICD-10-CM | POA: Diagnosis not present

## 2021-06-28 DIAGNOSIS — S72012A Unspecified intracapsular fracture of left femur, initial encounter for closed fracture: Secondary | ICD-10-CM | POA: Diagnosis present

## 2021-06-28 DIAGNOSIS — W010XXA Fall on same level from slipping, tripping and stumbling without subsequent striking against object, initial encounter: Secondary | ICD-10-CM | POA: Diagnosis present

## 2021-06-28 DIAGNOSIS — Z882 Allergy status to sulfonamides status: Secondary | ICD-10-CM | POA: Diagnosis not present

## 2021-06-28 DIAGNOSIS — Z8249 Family history of ischemic heart disease and other diseases of the circulatory system: Secondary | ICD-10-CM | POA: Diagnosis not present

## 2021-06-28 DIAGNOSIS — Z923 Personal history of irradiation: Secondary | ICD-10-CM | POA: Diagnosis not present

## 2021-06-28 DIAGNOSIS — I447 Left bundle-branch block, unspecified: Secondary | ICD-10-CM | POA: Diagnosis present

## 2021-06-28 DIAGNOSIS — S72009A Fracture of unspecified part of neck of unspecified femur, initial encounter for closed fracture: Secondary | ICD-10-CM | POA: Diagnosis present

## 2021-06-28 DIAGNOSIS — F419 Anxiety disorder, unspecified: Secondary | ICD-10-CM | POA: Diagnosis not present

## 2021-06-28 DIAGNOSIS — Z01810 Encounter for preprocedural cardiovascular examination: Secondary | ICD-10-CM | POA: Diagnosis not present

## 2021-06-28 DIAGNOSIS — M8580 Other specified disorders of bone density and structure, unspecified site: Secondary | ICD-10-CM | POA: Diagnosis not present

## 2021-06-28 DIAGNOSIS — W19XXXD Unspecified fall, subsequent encounter: Secondary | ICD-10-CM | POA: Diagnosis not present

## 2021-06-28 DIAGNOSIS — E559 Vitamin D deficiency, unspecified: Secondary | ICD-10-CM | POA: Diagnosis not present

## 2021-06-28 DIAGNOSIS — Z79899 Other long term (current) drug therapy: Secondary | ICD-10-CM | POA: Diagnosis not present

## 2021-06-28 DIAGNOSIS — R5381 Other malaise: Secondary | ICD-10-CM | POA: Diagnosis not present

## 2021-06-28 DIAGNOSIS — E871 Hypo-osmolality and hyponatremia: Secondary | ICD-10-CM | POA: Diagnosis not present

## 2021-06-28 DIAGNOSIS — Z471 Aftercare following joint replacement surgery: Secondary | ICD-10-CM | POA: Diagnosis not present

## 2021-06-28 DIAGNOSIS — I2510 Atherosclerotic heart disease of native coronary artery without angina pectoris: Secondary | ICD-10-CM | POA: Diagnosis not present

## 2021-06-28 DIAGNOSIS — Z7901 Long term (current) use of anticoagulants: Secondary | ICD-10-CM | POA: Diagnosis not present

## 2021-06-28 DIAGNOSIS — I10 Essential (primary) hypertension: Secondary | ICD-10-CM | POA: Diagnosis not present

## 2021-06-28 DIAGNOSIS — S72002D Fracture of unspecified part of neck of left femur, subsequent encounter for closed fracture with routine healing: Secondary | ICD-10-CM | POA: Diagnosis not present

## 2021-06-28 DIAGNOSIS — W19XXXA Unspecified fall, initial encounter: Secondary | ICD-10-CM | POA: Diagnosis not present

## 2021-06-28 DIAGNOSIS — S72042A Displaced fracture of base of neck of left femur, initial encounter for closed fracture: Secondary | ICD-10-CM | POA: Diagnosis not present

## 2021-06-28 DIAGNOSIS — R410 Disorientation, unspecified: Secondary | ICD-10-CM | POA: Diagnosis not present

## 2021-06-28 DIAGNOSIS — Y92480 Sidewalk as the place of occurrence of the external cause: Secondary | ICD-10-CM | POA: Diagnosis not present

## 2021-06-28 DIAGNOSIS — Z96642 Presence of left artificial hip joint: Secondary | ICD-10-CM | POA: Diagnosis not present

## 2021-06-28 DIAGNOSIS — E7800 Pure hypercholesterolemia, unspecified: Secondary | ICD-10-CM | POA: Diagnosis not present

## 2021-06-28 DIAGNOSIS — Z7982 Long term (current) use of aspirin: Secondary | ICD-10-CM | POA: Diagnosis not present

## 2021-06-28 DIAGNOSIS — G4700 Insomnia, unspecified: Secondary | ICD-10-CM | POA: Diagnosis not present

## 2021-06-28 HISTORY — PX: HIP ARTHROPLASTY: SHX981

## 2021-06-28 LAB — BASIC METABOLIC PANEL
Anion gap: 3 — ABNORMAL LOW (ref 5–15)
BUN: 11 mg/dL (ref 8–23)
CO2: 22 mmol/L (ref 22–32)
Calcium: 8.2 mg/dL — ABNORMAL LOW (ref 8.9–10.3)
Chloride: 105 mmol/L (ref 98–111)
Creatinine, Ser: 0.81 mg/dL (ref 0.44–1.00)
GFR, Estimated: >60 mL/min (ref >60)
Glucose, Bld: 115 mg/dL — ABNORMAL HIGH (ref 70–99)
Potassium: 4.1 mmol/L (ref 3.5–5.1)
Sodium: 130 mmol/L — ABNORMAL LOW (ref 135–145)

## 2021-06-28 LAB — CBC
HCT: 31.3 % — ABNORMAL LOW (ref 36.0–46.0)
Hemoglobin: 10.2 g/dL — ABNORMAL LOW (ref 12.0–15.0)
MCH: 26.4 pg (ref 26.0–34.0)
MCHC: 32.6 g/dL (ref 30.0–36.0)
MCV: 81.1 fL (ref 80.0–100.0)
Platelets: 155 10*3/uL (ref 150–400)
RBC: 3.86 MIL/uL — ABNORMAL LOW (ref 3.87–5.11)
RDW: 15.7 % — ABNORMAL HIGH (ref 11.5–15.5)
WBC: 9.2 10*3/uL (ref 4.0–10.5)
nRBC: 0 % (ref 0.0–0.2)

## 2021-06-28 SURGERY — HEMIARTHROPLASTY, HIP, DIRECT ANTERIOR APPROACH, FOR FRACTURE
Anesthesia: General | Site: Hip | Laterality: Left

## 2021-06-28 MED ORDER — LABETALOL HCL 5 MG/ML IV SOLN
INTRAVENOUS | Status: AC
  Filled 2021-06-28: qty 4

## 2021-06-28 MED ORDER — NEOMYCIN-POLYMYXIN B GU 40-200000 IR SOLN
Status: AC
  Filled 2021-06-28: qty 20

## 2021-06-28 MED ORDER — OXYCODONE HCL 5 MG/5ML PO SOLN: 5.0000 mg | Freq: Once | ORAL | Status: DC | PRN

## 2021-06-28 MED ORDER — SODIUM CHLORIDE 0.9 % IR SOLN: Status: DC | PRN

## 2021-06-28 MED ORDER — ACETAMINOPHEN 10 MG/ML IV SOLN: 1000.0000 mg | Freq: Once | INTRAVENOUS | Status: AC

## 2021-06-28 MED ORDER — BUPIVACAINE-EPINEPHRINE (PF) 0.5% -1:200000 IJ SOLN
INTRAMUSCULAR | Status: AC
  Filled 2021-06-28: qty 30

## 2021-06-28 MED ORDER — DEXAMETHASONE SODIUM PHOSPHATE 10 MG/ML IJ SOLN
INTRAMUSCULAR | Status: DC | PRN
  Administered 2021-06-28: 5 mg via INTRAVENOUS

## 2021-06-28 MED ORDER — VANCOMYCIN HCL 1 G IV SOLR
INTRAVENOUS | Status: DC | PRN
  Administered 2021-06-28: 1000 mg

## 2021-06-28 MED ORDER — ROSUVASTATIN CALCIUM 10 MG PO TABS: 10.0000 mg | ORAL_TABLET | Freq: Every day | ORAL | Status: DC

## 2021-06-28 MED ORDER — FENTANYL CITRATE (PF) 100 MCG/2ML IJ SOLN
INTRAMUSCULAR | Status: AC
  Filled 2021-06-28: qty 2

## 2021-06-28 MED ORDER — PROPOFOL 10 MG/ML IV BOLUS
INTRAVENOUS | Status: DC | PRN
  Administered 2021-06-28: 90 mg via INTRAVENOUS

## 2021-06-28 MED ORDER — VITAMIN D 25 MCG (1000 UNIT) PO TABS
2000.0000 [IU] | ORAL_TABLET | Freq: Every day | ORAL | Status: DC
  Administered 2021-06-29 – 2021-07-02 (×4): 2000 [IU] via ORAL
  Filled 2021-06-28 (×4): qty 2

## 2021-06-28 MED ORDER — OXYCODONE HCL 5 MG PO TABS: 5.0000 mg | ORAL_TABLET | ORAL | 0 refills | Status: DC | PRN

## 2021-06-28 MED ORDER — SODIUM CHLORIDE 0.9 % IR SOLN
Status: DC | PRN
  Administered 2021-06-28: 3000 mL

## 2021-06-28 MED ORDER — DEXAMETHASONE SODIUM PHOSPHATE 10 MG/ML IJ SOLN
INTRAMUSCULAR | Status: AC
  Filled 2021-06-28: qty 1

## 2021-06-28 MED ORDER — NITROGLYCERIN 0.4 MG SL SUBL: 0.4000 mg | SUBLINGUAL_TABLET | SUBLINGUAL | Status: DC | PRN

## 2021-06-28 MED ORDER — ONDANSETRON HCL 4 MG/2ML IJ SOLN
INTRAMUSCULAR | Status: AC
  Filled 2021-06-28: qty 2

## 2021-06-28 MED ORDER — CEFAZOLIN SODIUM-DEXTROSE 2-4 GM/100ML-% IV SOLN
2.0000 g | Freq: Three times a day (TID) | INTRAVENOUS | Status: AC
  Administered 2021-06-29 (×3): 2 g via INTRAVENOUS
  Filled 2021-06-28 (×3): qty 100

## 2021-06-28 MED ORDER — BUPIVACAINE-EPINEPHRINE (PF) 0.5% -1:200000 IJ SOLN
INTRAMUSCULAR | Status: DC | PRN
  Administered 2021-06-28: 30 mL via PERINEURAL

## 2021-06-28 MED ORDER — TRANEXAMIC ACID 1000 MG/10ML IV SOLN
INTRAVENOUS | Status: AC
  Filled 2021-06-28: qty 10

## 2021-06-28 MED ORDER — ENOXAPARIN SODIUM 40 MG/0.4ML IJ SOSY
40.0000 mg | PREFILLED_SYRINGE | Freq: Every day | INTRAMUSCULAR | Status: DC
Start: 2021-06-29 — End: 2021-07-02
  Administered 2021-06-29 – 2021-07-02 (×4): 40 mg via SUBCUTANEOUS
  Filled 2021-06-28 (×4): qty 0.4

## 2021-06-28 MED ORDER — LIDOCAINE HCL (CARDIAC) PF 100 MG/5ML IV SOSY
PREFILLED_SYRINGE | INTRAVENOUS | Status: DC | PRN
  Administered 2021-06-28: 60 mg via INTRAVENOUS

## 2021-06-28 MED ORDER — PROPOFOL 10 MG/ML IV BOLUS
INTRAVENOUS | Status: AC
  Filled 2021-06-28: qty 20

## 2021-06-28 MED ORDER — PHENYLEPHRINE 80 MCG/ML (10ML) SYRINGE FOR IV PUSH (FOR BLOOD PRESSURE SUPPORT)
PREFILLED_SYRINGE | INTRAVENOUS | Status: DC | PRN
  Administered 2021-06-28 (×3): 80 ug via INTRAVENOUS
  Administered 2021-06-28 (×2): 160 ug via INTRAVENOUS
  Administered 2021-06-28 (×2): 80 ug via INTRAVENOUS

## 2021-06-28 MED ORDER — 0.9 % SODIUM CHLORIDE (POUR BTL) OPTIME
TOPICAL | Status: DC | PRN
  Administered 2021-06-28: 1000 mL

## 2021-06-28 MED ORDER — LIDOCAINE HCL (PF) 2 % IJ SOLN
INTRAMUSCULAR | Status: AC
  Filled 2021-06-28: qty 5

## 2021-06-28 MED ORDER — SUGAMMADEX SODIUM 200 MG/2ML IV SOLN
INTRAVENOUS | Status: DC | PRN
  Administered 2021-06-28: 200 mg via INTRAVENOUS

## 2021-06-28 MED ORDER — FENTANYL CITRATE (PF) 100 MCG/2ML IJ SOLN
INTRAMUSCULAR | Status: DC | PRN
  Administered 2021-06-28 (×2): 50 ug via INTRAVENOUS

## 2021-06-28 MED ORDER — ACETAMINOPHEN 10 MG/ML IV SOLN
INTRAVENOUS | Status: AC
  Administered 2021-06-28: 1000 mg via INTRAVENOUS
  Filled 2021-06-28: qty 100

## 2021-06-28 MED ORDER — TRANEXAMIC ACID-NACL 1000-0.7 MG/100ML-% IV SOLN
INTRAVENOUS | Status: DC | PRN
  Administered 2021-06-28: 1000 mg via INTRAVENOUS

## 2021-06-28 MED ORDER — LABETALOL HCL 5 MG/ML IV SOLN
INTRAVENOUS | Status: DC | PRN
  Administered 2021-06-28: 5 mg via INTRAVENOUS

## 2021-06-28 MED ORDER — SODIUM CHLORIDE 0.9 % IR SOLN
Status: DC | PRN
  Administered 2021-06-28: 1000 mL

## 2021-06-28 MED ORDER — PANTOPRAZOLE SODIUM 40 MG PO TBEC: 40.0000 mg | DELAYED_RELEASE_TABLET | Freq: Every day | ORAL | Status: DC

## 2021-06-28 MED ORDER — CEFAZOLIN SODIUM-DEXTROSE 2-4 GM/100ML-% IV SOLN
INTRAVENOUS | Status: AC
  Filled 2021-06-28: qty 100

## 2021-06-28 MED ORDER — FENTANYL CITRATE (PF) 100 MCG/2ML IJ SOLN: 25.0000 ug | INTRAMUSCULAR | Status: DC | PRN

## 2021-06-28 MED ORDER — ROCURONIUM BROMIDE 100 MG/10ML IV SOLN
INTRAVENOUS | Status: DC | PRN
  Administered 2021-06-28: 30 mg via INTRAVENOUS
  Administered 2021-06-28 (×2): 10 mg via INTRAVENOUS

## 2021-06-28 MED ORDER — ENSURE ENLIVE PO LIQD
237.0000 mL | Freq: Two times a day (BID) | ORAL | Status: DC
  Administered 2021-06-29 – 2021-07-01 (×2): 237 mL via ORAL
  Filled 2021-06-28: qty 237

## 2021-06-28 MED ORDER — OXYCODONE HCL 5 MG PO TABS: 5.0000 mg | ORAL_TABLET | Freq: Once | ORAL | Status: DC | PRN

## 2021-06-28 MED ORDER — ADULT MULTIVITAMIN W/MINERALS CH
1.0000 | ORAL_TABLET | Freq: Every day | ORAL | Status: DC
  Administered 2021-06-29 – 2021-07-02 (×4): 1 via ORAL
  Filled 2021-06-28 (×4): qty 1

## 2021-06-28 MED ORDER — VANCOMYCIN HCL 1000 MG IV SOLR
INTRAVENOUS | Status: AC
  Filled 2021-06-28: qty 20

## 2021-06-28 MED ORDER — CEFAZOLIN SODIUM-DEXTROSE 2-4 GM/100ML-% IV SOLN
2.0000 g | INTRAVENOUS | Status: AC
Start: 2021-06-28 — End: 2021-06-28
  Administered 2021-06-28: 2 g via INTRAVENOUS

## 2021-06-28 SURGICAL SUPPLY — 68 items
BLADE SAGITTAL WIDE XTHICK NO (BLADE) ×2 IMPLANT
BNDG COHESIVE 4X5 TAN ST LF (GAUZE/BANDAGES/DRESSINGS) ×2 IMPLANT
CEMENT BONE 40GM (Cement) ×4 IMPLANT
CEMENTRALIZER 9.25MM (Orthopedic Implant) ×1 IMPLANT
COVER BACK TABLE REUSABLE LG (DRAPES) ×2 IMPLANT
DRAPE 3/4 80X56 (DRAPES) ×4 IMPLANT
DRAPE IMP U-DRAPE 54X76 (DRAPES) ×2 IMPLANT
DRAPE INCISE IOBAN 66X45 STRL (DRAPES) ×1 IMPLANT
DRAPE INCISE IOBAN 66X60 STRL (DRAPES) ×2 IMPLANT
DRAPE ORTHO SPLIT 77X108 STRL (DRAPES) ×4
DRAPE SURG 17X11 SM STRL (DRAPES) ×2 IMPLANT
DRAPE SURG ORHT 6 SPLT 77X108 (DRAPES) ×2 IMPLANT
DRSG AQUACEL AG ADV 3.5X10 (GAUZE/BANDAGES/DRESSINGS) ×2 IMPLANT
DRSG OPSITE POSTOP 4X10 (GAUZE/BANDAGES/DRESSINGS) ×1 IMPLANT
DURAPREP 26ML APPLICATOR (WOUND CARE) ×4 IMPLANT
ELECT CAUTERY BLADE 6.4 (BLADE) ×2 IMPLANT
ELECT REM PT RETURN 9FT ADLT (ELECTROSURGICAL) ×2
ELECTRODE REM PT RTRN 9FT ADLT (ELECTROSURGICAL) ×1 IMPLANT
GAUZE 4X4 16PLY ~~LOC~~+RFID DBL (SPONGE) ×2 IMPLANT
GAUZE SPONGE 4X4 12PLY STRL (GAUZE/BANDAGES/DRESSINGS) ×2 IMPLANT
GAUZE XEROFORM 1X8 LF (GAUZE/BANDAGES/DRESSINGS) ×4 IMPLANT
GLOVE SURG ENC MOIS LTX SZ7.5 (GLOVE) ×2 IMPLANT
GLOVE SURG UNDER POLY LF SZ7.5 (GLOVE) ×2 IMPLANT
GOWN STRL REUS W/ TWL XL LVL3 (GOWN DISPOSABLE) ×1 IMPLANT
GOWN STRL REUS W/TWL XL LVL3 (GOWN DISPOSABLE) ×2
HEAD FEM UNIPOLAR 47 OD STRL (Hips) ×1 IMPLANT
HEMOVAC 400ML (MISCELLANEOUS)
HOLSTER ELECTROSUGICAL PENCIL (MISCELLANEOUS) ×2 IMPLANT
HOOD PEEL AWAY FLYTE STAYCOOL (MISCELLANEOUS) ×2 IMPLANT
IV NS IRRIG 3000ML ARTHROMATIC (IV SOLUTION) ×2 IMPLANT
KIT DRAIN HEMOVAC JP 7FR 400ML (MISCELLANEOUS) IMPLANT
KIT TURNOVER KIT A (KITS) ×2 IMPLANT
MANIFOLD NEPTUNE II (INSTRUMENTS) ×2 IMPLANT
NDL FILTER BLUNT 18X1 1/2 (NEEDLE) ×1 IMPLANT
NDL MAYO CATGUT SZ4 TPR NDL (NEEDLE) ×1 IMPLANT
NDL SAFETY ECLIPSE 18X1.5 (NEEDLE) ×1 IMPLANT
NEEDLE FILTER BLUNT 18X 1/2SAF (NEEDLE) ×1
NEEDLE FILTER BLUNT 18X1 1/2 (NEEDLE) ×1 IMPLANT
NEEDLE HYPO 18GX1.5 SHARP (NEEDLE) ×2
NEEDLE MAYO CATGUT SZ4 (NEEDLE) ×2 IMPLANT
NS IRRIG 1000ML POUR BTL (IV SOLUTION) ×2 IMPLANT
PACK HIP PROSTHESIS (MISCELLANEOUS) ×2 IMPLANT
PILLOW ABDUCTION FOAM SM (MISCELLANEOUS) ×2 IMPLANT
PRESSURIZER FEM CANAL M (MISCELLANEOUS) IMPLANT
PULSAVAC PLUS IRRIG FAN TIP (DISPOSABLE) ×2
RESTRICTOR CEMENT PE SZ 2 (Cement) ×1 IMPLANT
RETRIEVER SUT HEWSON (MISCELLANEOUS) ×2 IMPLANT
SPACER FEM TAPERED +0 12/14 (Hips) ×1 IMPLANT
SPONGE T-LAP 18X18 ~~LOC~~+RFID (SPONGE) ×4 IMPLANT
STAPLER SKIN PROX 35W (STAPLE) ×2 IMPLANT
STEM SUMMIT CEMENTED BASIC SZ3 (Hips) IMPLANT
SUMMIT CEMENT BASIC SZ3 (Hips) ×2 IMPLANT
SUT ETHIBOND 2 V 37 (SUTURE) ×1 IMPLANT
SUT QUILL PDO 0 36 36 VIOLET (SUTURE) ×2 IMPLANT
SUT TICRON 2-0 30IN 311381 (SUTURE) ×4 IMPLANT
SUT VIC AB 0 CT1 36 (SUTURE) ×2 IMPLANT
SUT VIC AB 2-0 CT1 27 (SUTURE) ×4
SUT VIC AB 2-0 CT1 TAPERPNT 27 (SUTURE) ×2 IMPLANT
SUT VIC AB 2-0 CT2 27 (SUTURE) ×4 IMPLANT
SYR 10ML LL (SYRINGE) ×2 IMPLANT
TAPE MICROFOAM 4IN (TAPE) IMPLANT
TAPE TRANSPORE STRL 2 31045 (GAUZE/BANDAGES/DRESSINGS) ×2 IMPLANT
TIP BRUSH PULSAVAC PLUS 24.33 (MISCELLANEOUS) ×2 IMPLANT
TIP FAN IRRIG PULSAVAC PLUS (DISPOSABLE) ×1 IMPLANT
TOWER CARTRIDGE SMART MIX (DISPOSABLE) IMPLANT
TRAY FOLEY SLVR 16FR LF STAT (SET/KITS/TRAYS/PACK) ×2 IMPLANT
TUBE SUCT KAM VAC (TUBING) ×1 IMPLANT
WATER STERILE IRR 500ML POUR (IV SOLUTION) ×2 IMPLANT

## 2021-06-28 NOTE — Progress Notes (Signed)
Initial Nutrition Assessment ? ?DOCUMENTATION CODES:  ? ?Not applicable ? ?INTERVENTION:  ? ?-Once diet is advanced, add:  ? ?-Ensure Enlive po BID, each supplement provides 350 kcal and 20 grams of protein ?-MVI with minerals daily ? ?NUTRITION DIAGNOSIS:  ? ?Increased nutrient needs related to post-op healing as evidenced by estimated needs. ? ?GOAL:  ? ?Patient will meet greater than or equal to 90% of their needs ? ?MONITOR:  ? ?PO intake, Supplement acceptance, Diet advancement, Labs, Weight trends, Skin, I & O's ? ?REASON FOR ASSESSMENT:  ? ?Consult ?Assessment of nutrition requirement/status ? ?ASSESSMENT:  ? ?88 y/o F w/ PMH of HTN, HLD, CAD, GERD, breast cancer (in remission for at least 5 years) who presents a fall at [facility]. Pt stated she tripped over the sidewalk and fell on left hip. Pt denies any proceeding symptoms. Pt c/o left hip that is dull, constant w/o radiation. Moving her leg makes the pain and nothing makes the pain better. The severity is currently 6/10. Pt does not currently want pain medication. Pt denies any fevers, chills, sweating, cough, chest pain, shortness of breath, nausea, vomiting, abd pain, diarrhea, or constipation. ? ?Pt admitted with lt hip fracture.  ? ?Reviewed I/O's: 0 ml x 24 hours ? ?UOP: 5000 ml x 24 hours  ? ?Per orthopedics notes, plan for hemiarthroplasty today. Pt currently NPO for procedure.  ? ?Spoke with pt and daughter at bedside. Pt complains of being tired and hungry, but understanding of rationale of NPO. She reports good appetite PTA, consuming 3 meals per day. Per pt, her son comes up on weekends and takes her to her favorite restaurants. She denies any difficulty chewing or swallowing.  ? ?Pt denies any weight loss. Reviewed wt hx; no wt loss over the past 6 months.  ? ?Discussed importance of good meal and supplement intake to promote healing. Pt reports she was active PTA and was supposed to graduate from outpatient physical therapy this week.   ? ?Medications reviewed and include colace, vitamin D3, and 0.9% sodium chloride infusion @ 50 ml/hr.  ? ?Lab Results  ?Component Value Date  ? HGBA1C 6.2 05/11/2021  ? PTA DM medications are none.  ? ?Labs reviewed: Na: 130, CBGS: 97 (inpatient orders for glycemic control are none).   ? ?NUTRITION - FOCUSED PHYSICAL EXAM: ? ?Flowsheet Row Most Recent Value  ?Orbital Region Mild depletion  ?Upper Arm Region No depletion  ?Thoracic and Lumbar Region No depletion  ?Buccal Region No depletion  ?Temple Region Mild depletion  ?Clavicle Bone Region No depletion  ?Clavicle and Acromion Bone Region No depletion  ?Scapular Bone Region No depletion  ?Dorsal Hand No depletion  ?Patellar Region No depletion  ?Anterior Thigh Region No depletion  ?Posterior Calf Region No depletion  ?Edema (RD Assessment) None  ?Hair Reviewed  ?Eyes Reviewed  ?Mouth Reviewed  ?Skin Reviewed  ?Nails Reviewed  ? ?  ? ? ?Diet Order:   ?Diet Order   ? ?       ?  Diet NPO time specified  Diet effective now       ?  ? ?  ?  ? ?  ? ? ?EDUCATION NEEDS:  ? ?Education needs have been addressed ? ?Skin:  Skin Assessment: Reviewed RN Assessment ? ?Last BM:  06/27/21 ? ?Height:  ? ?Ht Readings from Last 1 Encounters:  ?06/27/21 5' (1.524 m)  ? ? ?Weight:  ? ?Wt Readings from Last 1 Encounters:  ?06/27/21 62.6 kg  ? ? ?  Ideal Body Weight:  45.5 kg ? ?BMI:  Body mass index is 26.95 kg/m?. ? ?Estimated Nutritional Needs:  ? ?Kcal:  1550-1750 ? ?Protein:  80-95 grams ? ?Fluid:  > 1.5 L ? ? ? ?Loistine Chance, RD, LDN, CDCES ?Registered Dietitian II ?Certified Diabetes Care and Education Specialist ?Please refer to [facility] for RD and/or RD on-call/weekend/after hours pager  ?

## 2021-06-28 NOTE — Consult Note (Signed)
?ORTHOPAEDIC CONSULTATION ? ?REQUESTING PHYSICIAN: Fritzi Mandes, MD ? ?Chief Complaint: Left hip pain ? ?HPI: ?Jenna Young is a 88 y.o. female who complains of left hip pain after mechanical fall on 4/19. The pain is sharp in character. The pain is worse with movement and better with rest. Denies any numbness, tingling or constitutional symptoms.  Patient was seen in the ER and admitted to the hospitalist service given findings of a displaced left femoral neck fracture.  She has a history of nondisplaced right femoral neck fracture and underwent percutaneous pinning over 15 years ago. ? ?The patient lives alone in [place] and ambulates without assistive devices.  She is currently on Plavix, last taken 4/19 AM.  She is accompanied by her daughter at bedside. ? ?Past Medical History:  ?Diagnosis Date  ? Allergic rhinitis   ? Anemia   ? Anxiety   ? GERD (gastroesophageal reflux disease)   ? History of radiation therapy 04/29/16- 05/24/16  ? Left Breast 40.05 Gy in 15 fractions, Left Breast boost 10 Gy in 5 fractions.   ? Hyperlipidemia   ? Hypertension   ? treated by Dr Thurnell Garbe recently with syncope episode  ? Osteopenia   ? Osteoporosis   ? Personal history of radiation therapy 2018  ? ?Past Surgical History:  ?Procedure Laterality Date  ? APPENDECTOMY    ? BREAST BIOPSY Left 11/29/2008  ? BREAST BIOPSY Left 01/26/2016  ? malignant  ? BREAST EXCISIONAL BIOPSY Left 12/26/2008  ? BREAST LUMPECTOMY Left 02/27/2016  ? BREAST LUMPECTOMY Left 04/01/2016  ? BREAST LUMPECTOMY WITH RADIOACTIVE SEED LOCALIZATION Left 02/27/2016  ? Procedure: LEFT BREAST LUMPECTOMY WITH RADIOACTIVE SEED LOCALIZATION;  Surgeon: Autumn Messing III, MD;  Location: [place];  Service: General;  Laterality: Left;  ? BREAST SURGERY  10/10  ? benign biopsy  negative  ? CESAREAN SECTION    ? CHOLECYSTECTOMY    ? CORONARY STENT INTERVENTION N/A 02/11/2017  ? Procedure: CORONARY STENT INTERVENTION;  Surgeon: Nelva Bush, MD;   Location: [facility];  Service: Cardiovascular;  Laterality: N/A;  ? FEMUR FRACTURE SURGERY Right 2006  ? LEFT HEART CATH AND CORONARY ANGIOGRAPHY N/A 02/11/2017  ? Procedure: LEFT HEART CATH AND CORONARY ANGIOGRAPHY;  Surgeon: Nelva Bush, MD;  Location: [place] [facility];  Service: Cardiovascular;  Laterality: N/A;  ? LEFT HEART CATH AND CORONARY ANGIOGRAPHY N/A 05/21/2019  ? Procedure: LEFT HEART CATH AND CORONARY ANGIOGRAPHY;  Surgeon: Wellington Hampshire, MD;  Location: [facility];  Service: Cardiovascular;  Laterality: N/A;  ? OVARIAN CYST SURGERY    ? RE-EXCISION OF BREAST LUMPECTOMY Left 04/01/2016  ? Procedure: RE-EXCISION OF LEFT BREAST MEDIAL MARGIN;  Surgeon: Autumn Messing III, MD;  Location: [place];  Service: General;  Laterality: Left;  ? ?Social History  ? ?Socioeconomic History  ? Marital status: Widowed  ?  Spouse name: Not on file  ? Number of children: 1  ? Years of education: Not on file  ? Highest education level: Not on file  ?Occupational History  ?  Employer: RETIRED  ?Tobacco Use  ? Smoking status: Former  ?  Types: Cigarettes  ?  Quit date: 03/11/1968  ?  Years since quitting: 53.3  ? Smokeless tobacco: Never  ?Vaping Use  ? Vaping Use: Never used  ?Substance and Sexual Activity  ? Alcohol use: No  ?  Alcohol/week: 0.0 standard drinks  ? Drug use: No  ? Sexual activity: Not Currently  ?Other  Topics Concern  ? Not on file  ?Social History Narrative  ? Not on file  ? ?Social Determinants of Health  ? ?Financial Resource Strain: Low Risk   ? Difficulty of Paying Living Expenses: Not hard at all  ?Food Insecurity: No Food Insecurity  ? Worried About Charity fundraiser in the Last Year: Never true  ? Ran Out of Food in the Last Year: Never true  ?Transportation Needs: No Transportation Needs  ? Lack of Transportation (Medical): No  ? Lack of Transportation (Non-Medical): No  ?Physical Activity: Insufficiently Active  ? Days of Exercise per Week: 2 days  ?  Minutes of Exercise per Session: 10 min  ?Stress: No Stress Concern Present  ? Feeling of Stress : Not at all  ?Social Connections: Moderately Integrated  ? Frequency of Communication with Friends and Family: More than three times a week  ? Frequency of Social Gatherings with Friends and Family: Never  ? Attends Religious Services: More than 4 times per year  ? Active Member of Clubs or Organizations: Yes  ? Attends Archivist Meetings: More than 4 times per year  ? Marital Status: Widowed  ? ?Family History  ?Problem Relation Age of Onset  ? Hypertension Mother   ? Heart disease Father   ? Macular degeneration Sister   ? Diabetes Maternal Aunt   ? Cancer Maternal Aunt   ?     throat  ? ?Allergies  ?Allergen Reactions  ? Buspirone Hcl   ?  REACTION: HEART PALPITATIONS  ? Codeine   ?  REACTION: nausea and vomiting  ? Hydrochlorothiazide   ?  REACTION: syncope-possibly from dehydration  ? Iron   ?  Oral iron -per pt made her "pass out"   ? Lansoprazole   ?  REACTION: abd pain  ? Minocycline Hcl   ? Penicillins   ?  REACTION: mouth numbness ?Has patient had a PCN reaction causing immediate rash, facial/tongue/throat swelling, SOB or lightheadedness with hypotension: No ?Has patient had a PCN reaction causing severe rash involving mucus membranes or skin necrosis: No ?Has patient had a PCN reaction that required hospitalization: No ?Has patient had a PCN reaction occurring within the last 10 years: No ?If all of the above answers are "NO", then may proceed with Cephalosporin use.  ? Phenergan [Promethazine Hcl] Other (See Comments)  ?  Body shakes, hallucinations  ? Ramipril   ?  Cough ?  ? Ranexa [Ranolazine]   ?  Constipation   ? Sulfa Antibiotics Nausea Only  ? Rosuvastatin   ?  Patient reports muscle pain, unable to take. Stopped around 07/22.  ? ?Prior to Admission medications   ?Medication Sig Start Date End Date Taking? Authorizing Provider  ?carvedilol (COREG) 3.125 MG tablet TAKE 1 TABLET(3.125 MG) BY  MOUTH TWICE DAILY 03/23/21  Yes Gollan, Kathlene November, MD  ?Cholecalciferol (VITAMIN D) 2000 UNITS CAPS Take 1 capsule by mouth daily.   Yes [provider]  ?clopidogrel (PLAVIX) 75 MG tablet TAKE 1 TABLET(75 MG) BY MOUTH DAILY WITH BREAKFAST 05/18/21  Yes Tower, Marne A, MD  ?cyanocobalamin (,VITAMIN B-12,) 1000 MCG/ML injection Inject 1,000 mcg into the muscle every 30 (thirty) days.   Yes [provider]  ?isosorbide mononitrate (IMDUR) 30 MG 24 hr tablet TAKE 1/2 TABLET(15 MG) BY MOUTH DAILY 05/15/21  Yes Gollan, Kathlene November, MD  ?losartan (COZAAR) 100 MG tablet Take 1 tablet (100 mg total) by mouth daily. 01/08/21  Yes Minna Merritts,  MD  ?omeprazole (PRILOSEC) 20 MG capsule TAKE 1 CAPSULE(20 MG) BY MOUTH DAILY AS NEEDED 03/24/21  Yes Tower, Marne A, MD  ?rosuvastatin (CRESTOR) 10 MG tablet TAKE 1 TABLET(10 MG) BY MOUTH DAILY 03/23/21  Yes Gollan, Kathlene November, MD  ?Skin Protectants, Misc. (EUCERIN) cream Apply 1 application. topically as needed for dry skin.   Yes [provider]  ?aspirin 81 MG EC tablet Take 81 mg by mouth daily. Swallow whole. ?Patient not taking: Reported on 06/27/2021    [provider]  ?nitroGLYCERIN (NITROSTAT) 0.4 MG SL tablet PLACE 1 TABLET UNDER TONGUE EVERY 5 MIN AS NEEDED FOR CHEST PAIN IF NO RELIEF IN15 MIN CALL 911 (MAX 3 TABS) 12/29/18   Gollan, Kathlene November, MD  ?terconazole (TERAZOL 3) 0.8 % vaginal cream Apply to affected vaginal area daily as needed for itching/yeast ?Patient not taking: Reported on 06/27/2021 05/18/21   Abner Greenspan, MD  ? ?[place] ([place] ? ?Result Date: 06/27/2021 ?CLINICAL DATA:  Trauma, fall EXAM: CT HEAD WITHOUT CONTRAST TECHNIQUE: Contiguous axial images were obtained from the base of the skull through the vertex without intravenous contrast. RADIATION DOSE REDUCTION: This exam was performed according to the departmental dose-optimization program which includes automated exposure control, adjustment of the mA and/or kV  according to patient size and/or use of iterative reconstruction technique. COMPARISON:  02/28/2017 FINDINGS: Brain: There is prominence of third and both lateral ventricles. Cortical sulci are prominent. No

## 2021-06-28 NOTE — Op Note (Signed)
06/28/2021 ? ?6:00 PM ? ?PATIENT:  Jenna Young  ? ?MRN: [id number] ? ?PRE-OPERATIVE DIAGNOSIS:  Left Femoral Neck Fracture ? ?POST-OPERATIVE DIAGNOSIS:  Left Femoral Neck Fracture ? ?PROCEDURE:  Procedure(s): ?ARTHROPLASTY BIPOLAR HIP (HEMIARTHROPLASTY) ? ?PREOPERATIVE INDICATIONS:   ? ?Toba Claudio is an 88 y.o. female who was admitted with a diagnosis of Left Femoral Neck Fracture.  I have recommended surgical fixation with hemiarthroplasty for this injury. I have explained the surgery and the postoperative course to the patient and their family who have agreed with surgical management of this fracture.   ? ?The risks benefits and alternatives were discussed with the patient and their family including but not limited to the risks of  infection requiring removal of the prosthesis, bleeding requiring blood transfusion, nerve injury especially to the sciatic nerve leading to foot drop or lower extremity numbness, periprosthetic fracture, dislocation leg length discrepancy, change in lower extremity rotation persistent hip pain, loosening or failure of the components and the need for revision surgery. Medical risks include but are not limited to DVT and pulmonary embolism, myocardial infarction, stroke, pneumonia, respiratory failure and death. ?   ?SURGEON:  Renee Harder, MD ? ?   ?ANESTHESIA: General ?   ?COMPLICATIONS:  None.  ?   ?COMPONENTS: [place] size 3 cemented stem, 47 mm head ?   ?PROCEDURE IN DETAIL:  ? ?The patient was met in the holding area and  identified.  The appropriate hip was identified and marked at the operative site after verbally confirming with the patient that this was the correct site of surgery.  The patient was then transported to the OR  and  underwent general anesthesia.  The patient was then placed in the lateral decubitus position with the operative side up and secured on the operating room table with a pegboard and all bony prominences were adequately padded.  This included an axillary roll and additional padding around the nonoperative leg to prevent compression to the common peroneal nerve. ?   ?The operative lower extremity was prepped and draped in a sterile fashion.  A time out was performed prior to incision to verify patient's name, date of birth, medical record number, correct site of surgery correct procedure to be performed. The timeout was also used to verify the patient received antibiotics now appropriate instruments, implants and radiographic studies were available in the room. Once all in attendance were in agreement case began. ?   ?A posterolateral approach was utilized via sharp dissection  carried down to the subcutaneous tissue.  Bleeding vessels were coagulated using electrocautery.  The fascia lata was identified and incised along the length of the skin incision.  The gluteus maximus muscle was then split in line with its fibers. Self-retaining retractors were  inserted.  With the hip internally rotated, the short external rotators  were identified and removed from the posterior attachment from the greater trochanter. The piriformis was tagged for later repair. The capsule was identified and a T-shaped capsulotomy was performed. The capsule was tagged with #2 Tycron for later repair. ? ?The femoral neck fracture was exposed, and the femoral head was removed using a corkscrew device. This was measured to be 47 mm in diameter. The attention was then turned to proximal femur preparation.  An oscillating saw was used to perform a proximal femoral osteotomy 1 fingerbreadth above the lesser trochanter. The trial 47 mm femoral head was placed into the acetabulum and had an excellent suction fit. The attention was then turned  back to femoral preparation. ?   ?A femoral skid and Cobra retractor were placed under the femoral neck to allow for adequate visualization. A box osteotome was used to make the initial entry into the proximal femur. A single hand  reamer was used to prepare the femoral canal. A T-shaped femoral canal sounder was then used to ensure no penetration femoral cortex had occurred during reaming. The proximal femur was then sequentially broached by hand. A size 3 femoral trial broach was found to have best medial to lateral canal fit. Once adequate mediolateral canal fill was achieved the trial femoral broach, neck, and head was assembled and the hip was reduced. It was found to have excellent stability, equivalent leg lengths with functional range of motion. The trial components were then removed. ? ?I copiously irrigated the femoral canal and then cemented the real femoral prosthesis into place into the appropriate version, slightly anteverted to the normal anatomy, and I impacted the actual 47 mm head into place. The hip was then reduced and taken through functional range of motion and found to have excellent stability. Leg lengths were restored. The hip joint was copiously irrigated.  ? ?A soft tissue repair of the capsule and external rotators was performed using #2 Tycron Excellent posterior capsular repair was achieved. The fascia lata was then closed with interrupted 0 Vicryl suture as well as 0 barbed suture. The subcutaneous tissues were closed with 2-0 Vicryl and the skin approximated with staples.  ? ?The patient was then placed supine on the operative table. Leg lengths were checked clinically and found to be equivalent. An abduction pillow was placed between the lower extremities. The patient was then transferred to a hospital bed and brought to the PACU in stable condition. ? ?Postop plan: ?Patient will be weightbearing as tolerated of the left lower extremity with posterior hip precautions x3 months ?Patient will resume DVT prophylaxis tomorrow, postop day 1 ?Foley can be removed after patient is up with PT ?Aquacel dressing to remain in place until follow-up in clinic ?Follow-up in clinic in approximately 2 weeks for staple removal  and x-rays ? ?Renee Harder ?Orthopedic Surgeon  ?

## 2021-06-28 NOTE — Transfer of Care (Signed)
Immediate Anesthesia Transfer of Care Note ? ?Patient: Jenna Young ? ?Procedure(s) Performed: ARTHROPLASTY BIPOLAR HIP (HEMIARTHROPLASTY) (Left: Hip) ? ?Patient Location: PACU ? ?Anesthesia Type:General ? ?Level of Consciousness: drowsy ? ?Airway & Oxygen Therapy: Patient Spontanous Breathing and Patient connected to face mask oxygen ? ?Post-op Assessment: Report given to RN and Post -op Vital signs reviewed and stable ? ?Post vital signs: Reviewed and stable ? ?Last Vitals:  ?Vitals Value Taken Time  ?BP 141/62 06/28/21 1804  ?Temp    ?Pulse 74 06/28/21 1806  ?Resp 22 06/28/21 1806  ?SpO2 98 % 06/28/21 1806  ?Vitals shown include unvalidated device data. ? ?Last Pain:  ?Vitals:  ? 06/28/21 1430  ?TempSrc:   ?PainSc: 8   ?   ? ?Patients Stated Pain Goal: 0 (06/27/21 2353) ? ?Complications: No notable events documented. ?

## 2021-06-28 NOTE — Anesthesia Procedure Notes (Signed)
Procedure Name: Intubation ?Date/Time: 06/28/2021 3:49 PM ?Performed by: Cammie Sickle, CRNA ?Pre-anesthesia Checklist: Patient identified, Patient being monitored, Timeout performed, Emergency Drugs available and Suction available ?Patient Re-evaluated:Patient Re-evaluated prior to induction ?Oxygen Delivery Method: Circle system utilized ?Preoxygenation: Pre-oxygenation with 100% oxygen ?Induction Type: IV induction ?Ventilation: Mask ventilation without difficulty ?Laryngoscope Size: 3 and McGraph ?Grade View: Grade I ?Tube type: Oral ?Tube size: 6.5 mm ?Number of attempts: 1 ?Airway Equipment and Method: Stylet ?Placement Confirmation: ETT inserted through vocal cords under direct vision, positive ETCO2 and breath sounds checked- equal and bilateral ?Secured at: 20 cm ?Tube secured with: Tape ?Dental Injury: Teeth and Oropharynx as per pre-operative assessment  ? ? ? ? ?

## 2021-06-28 NOTE — Anesthesia Preprocedure Evaluation (Signed)
Anesthesia Evaluation  ?Patient identified by MRN, date of birth, ID band ?Patient awake and Patient confused ? ? ? ?Reviewed: ?Allergy & Precautions, NPO status , Patient's Chart, lab work & pertinent test results ? ?History of Anesthesia Complications ?Negative for: history of anesthetic complications ? ?Airway ?Mallampati: II ? ?TM Distance: >3 FB ?Neck ROM: full ? ? ? Dental ? ?(+) Missing ?  ?Pulmonary ?neg shortness of breath, former smoker,  ?  ?Pulmonary exam normal ? ? ? ? ? ? ? Cardiovascular ?hypertension, (-) angina+ CAD  ?Normal cardiovascular exam+ Valvular Problems/Murmurs  ? ? ?  ?Neuro/Psych ?PSYCHIATRIC DISORDERS negative neurological ROS ?   ? GI/Hepatic ?Neg liver ROS, GERD  Controlled,  ?Endo/Other  ?negative endocrine ROS ? Renal/GU ?  ? ?  ?Musculoskeletal ? ? Abdominal ?  ?Peds ? Hematology ?negative hematology ROS ?(+)   ?Anesthesia Other Findings ?Past Medical History: ?No date: Allergic rhinitis ?No date: Anemia ?No date: Anxiety ?No date: GERD (gastroesophageal reflux disease) ?04/29/16- 05/24/16: History of radiation therapy ?    Comment:  Left Breast 40.05 Gy in 15 fractions, Left Breast boost  ?             10 Gy in 5 fractions.  ?No date: Hyperlipidemia ?No date: Hypertension ?    Comment:  treated by Dr Thurnell Garbe recently with syncope  ?             episode ?No date: Osteopenia ?No date: Osteoporosis ?2018: Personal history of radiation therapy ? ?Past Surgical History: ?No date: APPENDECTOMY ?11/29/2008: BREAST BIOPSY; Left ?01/26/2016: BREAST BIOPSY; Left ?    Comment:  malignant ?12/26/2008: BREAST EXCISIONAL BIOPSY; Left ?02/27/2016: BREAST LUMPECTOMY; Left ?04/01/2016: BREAST LUMPECTOMY; Left ?02/27/2016: BREAST LUMPECTOMY WITH RADIOACTIVE SEED LOCALIZATION; Left ?    Comment:  Procedure: LEFT BREAST LUMPECTOMY WITH RADIOACTIVE SEED  ?             LOCALIZATION;  Surgeon: Autumn Messing III, MD;  Location:  ?             [place];   Service: General;   ?             Laterality: Left; ?10/10: BREAST SURGERY ?    Comment:  benign biopsy  negative ?No date: CESAREAN SECTION ?No date: CHOLECYSTECTOMY ?02/11/2017: CORONARY STENT INTERVENTION; N/A ?    Comment:  Procedure: CORONARY STENT INTERVENTION;  Surgeon: End,  ?             Harrell Gave, MD;  Location: [facility];   ?             Service: Cardiovascular;  Laterality: N/A; ?2006: FEMUR FRACTURE SURGERY; Right ?02/11/2017: LEFT HEART CATH AND CORONARY ANGIOGRAPHY; N/A ?    Comment:  Procedure: LEFT HEART CATH AND CORONARY ANGIOGRAPHY;   ?             Surgeon: Nelva Bush, MD;  Location: [facility]  ?             [facility];  Service: Cardiovascular;  Laterality: N/A; ?05/21/2019: LEFT HEART CATH AND CORONARY ANGIOGRAPHY; N/A ?    Comment:  Procedure: LEFT HEART CATH AND CORONARY ANGIOGRAPHY;   ?             Surgeon: Wellington Hampshire, MD;  Location: [facility]  ?             [facility];  Service: Cardiovascular;  Laterality: N/A; ?No date: OVARIAN CYST SURGERY ?04/01/2016:  RE-EXCISION OF BREAST LUMPECTOMY; Left ?    Comment:  Procedure: RE-EXCISION OF LEFT BREAST MEDIAL MARGIN;   ?             Surgeon: Autumn Messing III, MD;  Location: [facility] ?             CENTER;  Service: General;  Laterality: Left; ? ?BMI   ? Body Mass Index: 26.95 kg/m?  ?  ? ? Reproductive/Obstetrics ?negative OB ROS ? ?  ? ? ? ? ? ? ? ? ? ? ? ? ? ?  ?  ? ? ? ? ? ? ? ? ?Anesthesia Physical ?Anesthesia Plan ? ?ASA: 3 ? ?Anesthesia Plan: General ETT  ? ?Post-op Pain Management:   ? ?Induction: Intravenous ? ?PONV Risk Score and Plan: Ondansetron, Dexamethasone, Midazolam and Treatment may vary due to age or medical condition ? ?Airway Management Planned: Oral ETT ? ?Additional Equipment:  ? ?Intra-op Plan:  ? ?Post-operative Plan: Extubation in OR ? ?Informed Consent: I have reviewed the patients History and Physical, chart, labs and discussed the procedure including the risks, benefits and alternatives for  the proposed anesthesia with the patient or authorized representative who has indicated his/her understanding and acceptance.  ? ? ? ?Dental Advisory Given ? ?Plan Discussed with: Anesthesiologist, CRNA and Surgeon ? ?Anesthesia Plan Comments: (Not a candidate for spinal 2/2 anticoagulation ? ?Patient and daughter consented for risks of anesthesia including but not limited to:  ?- adverse reactions to medications ?- damage to eyes, teeth, lips or other oral mucosa ?- nerve damage due to positioning  ?- sore throat or hoarseness ?- Damage to heart, brain, nerves, lungs, other parts of body or loss of life ? ?They voiced understanding.)  ? ? ? ? ? ? ?Anesthesia Quick Evaluation ? ?

## 2021-06-28 NOTE — Anesthesia Postprocedure Evaluation (Signed)
Anesthesia Post Note ? ?Patient: Jenna Young ? ?Procedure(s) Performed: ARTHROPLASTY BIPOLAR HIP (HEMIARTHROPLASTY) (Left: Hip) ? ?Patient location during evaluation: PACU ?Anesthesia Type: General ?Level of consciousness: awake and alert and patient cooperative ?Pain management: pain level controlled ?Vital Signs Assessment: post-procedure vital signs reviewed and stable ?Respiratory status: spontaneous breathing, nonlabored ventilation and respiratory function stable ?Cardiovascular status: blood pressure returned to baseline and stable ?Postop Assessment: adequate PO intake ?Anesthetic complications: no ? ? ?No notable events documented. ? ? ?Last Vitals:  ?Vitals:  ? 06/28/21 1845 06/28/21 1933  ?BP: (!) 151/67 (!) 146/62  ?Pulse: 79 73  ?Resp: 20 20  ?Temp: 37.2 ?C 36.8 ?C  ?SpO2: 93% 91%  ?  ?Last Pain:  ?Vitals:  ? 06/28/21 1430  ?TempSrc:   ?PainSc: 8   ? ? ?  ?  ?  ?  ?  ?  ? ?Darrin Nipper ? ? ? ? ?

## 2021-06-28 NOTE — Progress Notes (Signed)
[place] [facility] ? ? ?PATIENT NAME: Jenna Young   ? ?MR#:  [id number] ? ?DATE OF BIRTH:  01/04/1932 ? ?SUBJECTIVE:  ? ?patient came in after she tripped off the sidewalk [facility]. Found to have hip fracture. NPO for surgery scheduled later today. Daughter at bedside. ? ? ?VITALS:  ?Blood pressure (!) 144/68, pulse 72, temperature 98.6 ?F (37 ?C), temperature source Oral, resp. rate 18, height 5' (1.524 m), weight 62.6 kg, SpO2 92 %. ? ?PHYSICAL EXAMINATION:  ? ?GENERAL:  88 y.o.-year-old patient lying in the bed with no acute distress.  ?LUNGS: Normal breath sounds bilaterally, no wheezing, rales, rhonchi.  ?CARDIOVASCULAR: S1, S2 normal. No murmurs, rubs, or gallops.  ?ABDOMEN: Soft, nontender, nondistended. Bowel sounds present.  ?EXTREMITIES: decreased ROM ?NEUROLOGIC: nonfocal  patient is alert and awake ?SKIN: No obvious rash, lesion, or ulcer.  ? ?LABORATORY PANEL:  ?CBC ?Recent Labs  ?Lab 06/28/21 ?0552  ?WBC 9.2  ?HGB 10.2*  ?HCT 31.3*  ?PLT 155  ? ? ?Chemistries  ?Recent Labs  ?Lab 06/27/21 ?1655 06/28/21 ?0552  ?NA 133* 130*  ?K 3.8 4.1  ?CL 101 105  ?CO2 22 22  ?GLUCOSE 117* 115*  ?BUN 12 11  ?CREATININE 0.92 0.81  ?CALCIUM 9.3 8.2*  ?AST 25  --   ?ALT 17  --   ?ALKPHOS 66  --   ?BILITOT 0.7  --   ? ?Cardiac Enzymes ?No results for input(s): TROPONINI in the last 168 hours. ?RADIOLOGY:  ?CT HEAD WO CONTRAST (5MM) ? ?Result Date: 06/27/2021 ?CLINICAL DATA:  Trauma, fall EXAM: CT HEAD WITHOUT CONTRAST TECHNIQUE: Contiguous axial images were obtained from the base of the skull through the vertex without intravenous contrast. RADIATION DOSE REDUCTION: This exam was performed according to the departmental dose-optimization program which includes automated exposure control, adjustment of the mA and/or kV according to patient size and/or use of iterative reconstruction technique. COMPARISON:  02/28/2017 FINDINGS: Brain: There is  prominence of third and both lateral ventricles. Cortical sulci are prominent. No acute intracranial findings are seen. There is decreased density in the periventricular and subcortical white matter. Vascular: Scattered arterial calcifications are seen. Skull: Unremarkable. Sinuses/Orbits: There is mucosal thickening in the ethmoid and sphenoid sinuses. Other: No significant interval changes are noted. IMPRESSION: No acute intracranial findings are seen in noncontrast CT brain. Atrophy. Small-vessel disease. Chronic sinusitis. Electronically Signed   By: Elmer Picker M.D.   On: 06/27/2021 17:03  ? ?CT Cervical Spine Wo Contrast ? ?Result Date: 06/27/2021 ?CLINICAL DATA:  Neck trauma (Age >= 65y) EXAM: CT CERVICAL SPINE WITHOUT CONTRAST TECHNIQUE: Multidetector CT imaging of the cervical spine was performed without intravenous contrast. Multiplanar CT image reconstructions were also generated. RADIATION DOSE REDUCTION: This exam was performed according to the departmental dose-optimization program which includes automated exposure control, adjustment of the mA and/or kV according to patient size and/or use of iterative reconstruction technique. COMPARISON:  None. FINDINGS: Alignment: No substantial sagittal subluxation. Skull base and vertebrae: No evidence of acute fracture. Osteopenia. Soft tissues and spinal canal: No prevertebral fluid or swelling. No visible canal hematoma. Disc levels: Multilevel facet uncovertebral hypertrophy then agrees neural foraminal stenosis. Mild for age multilevel degenerative disease. Upper chest: Biapical pleuroparenchymal scarring. IMPRESSION: 1. No evidence of acute fracture or traumatic malalignment. 2. Osteopenia. Electronically Signed   By: Margaretha Sheffield M.D.   On: 06/27/2021 17:02  ? ?DG Chest Port 1 View ? ?Result  Date: 06/27/2021 ?CLINICAL DATA:  [place], left hip fracture EXAM: PORTABLE CHEST 1 VIEW COMPARISON:  05/21/2019 FINDINGS: Single frontal view of the chest  demonstrates an enlarged cardiac silhouette. There is diffuse interstitial scarring without focal airspace disease, effusion, or pneumothorax. No acute bony abnormalities. IMPRESSION: 1. Chronic scarring, no acute intrathoracic process. Electronically Signed   By: Randa Ngo M.D.   On: 06/27/2021 16:48  ? ?DG Hip Unilat W or Wo Pelvis 2-3 Views Left ? ?Result Date: 06/27/2021 ?CLINICAL DATA:  Loss of balance, fell, left-sided buttock pain EXAM: DG HIP (WITH OR WITHOUT PELVIS) 2-3V LEFT COMPARISON:  None. FINDINGS: Frontal view of the pelvis as well as frontal and cross-table lateral views of the left hip are obtained. There is an impacted subcapital left femoral neck fracture with varus angulation at the fracture site. No dislocation. Prior postsurgical changes of the right hip with 4 cannulated screws traversing the right femoral neck. Remainder of the bony pelvis is unremarkable. IMPRESSION: 1. Acute impacted subcapital left femoral neck fracture. Electronically Signed   By: Randa Ngo M.D.   On: 06/27/2021 16:47   ? ?Assessment and Plan ? ?88 y/o F w/ PMH of HTN, HLD, CAD, GERD, breast cancer (in remission for at least 5 years) [facility] Pt stated she tripped over the sidewalk and fell on left hip. ? ?Left hip fracture: secondary to [facility]. --prn Norco, IV morphine prn for pain.  ?--NPO after midnight as per ortho surg.  ?--Orthopedic consult with Dr. Sharlet Salina. ?-- DVT orders per orthopedic ?-- PT OT from tomorrow ?-- TOC for discharge planning ?  ?HTN:  ?--continue on home dose of carvedilol, imdur, losartan. IV hydralazine prn ?  ?HLD: cont rosuvastatin   ? ?Hx of CAD: continue on carvedilol, imdur & losartan.  ?--Holding home dose of aspirin, plavix for surgery today. ?-- EKG shows sinus rhythm with left axis deviation, LBBB ?-- no chest pain or shortness of breath. ?-- Follows Dr. Rockey Situ as outpatient ?  ?GERD: continue on PPI  ?  ?Hyponatremia: mild. Will  continue on IVFs  ?  ? ? ? ?Procedures: ?Family communication : daughter at bedside ?Consults : orthopedic ?CODE STATUS: full ?DVT Prophylaxis : SCD ?Level of care: Med-Surg ?Status is: inpatient ?Hip fracture pending surgery ? ?  ? ?TOTAL TIME TAKING CARE OF THIS PATIENT: 30 minutes.  ?>50% time spent on counselling and coordination of care ? ?Note: This dictation was prepared with Dragon dictation along with smaller phrase technology. Any transcriptional errors that result from this process are unintentional. ? ?Fritzi Mandes M.D  ? ? ?[facility]  ? ?CC: ?Primary care physician; Tower, [name], MD  ?

## 2021-06-28 NOTE — Discharge Instructions (Addendum)
Orthopedic discharge instructions: ?Patient has had a left hip hemiarthroplasty. Patient should remain full weightbearing on the left lower extremity. Patient needs to observe posterior hip precautions. Patient should continue to elevate the left lower extremity and may apply ice to the surgical site prn.  The patient should continue using incentive spirometry every hour while awake. Patient should also continue TED stockings until their orthopedic follow-up. Patient should take Lovenox '40mg'$  injections daily x 2 weeks post-op. Patient should continue physical and occupational therapy and the SNF to work on hip range of motion, lower extremity strengthening and gait training. She should use a walker for assistance with ambulation at all times.  Patient should have their Aquacell dressing left in place unless saturated.  Patient will follow up with Dr. Renee Harder at [facility], [facility] office in 14 days for wound check, staple removal and x-ray. ? ?

## 2021-06-28 NOTE — Progress Notes (Signed)
Patient awake to self, reviewed with Dr. Erenest Rasher: anesthesia  ok for transfer to floor.  ? ?Upojn arrival to floor room 160 pt pulling at equipment, confused, accepting RN aware, nursing sup alerted, may need a sitter for this pm.  ? ? ? ? ? ? ? ? ? ? ? ? ? ? ? ?

## 2021-06-29 ENCOUNTER — Encounter: Payer: Self-pay | Admitting: Orthopaedic Surgery

## 2021-06-29 DIAGNOSIS — S72002A Fracture of unspecified part of neck of left femur, initial encounter for closed fracture: Secondary | ICD-10-CM | POA: Diagnosis not present

## 2021-06-29 LAB — HEMOGLOBIN: Hemoglobin: 9.3 g/dL — ABNORMAL LOW (ref 12.0–15.0)

## 2021-06-29 MED ORDER — TRAZODONE HCL 50 MG PO TABS
25.0000 mg | ORAL_TABLET | Freq: Every evening | ORAL | Status: DC | PRN
  Administered 2021-06-29: 25 mg via ORAL
  Filled 2021-06-29: qty 1

## 2021-06-29 MED ORDER — CLOPIDOGREL BISULFATE 75 MG PO TABS
75.0000 mg | ORAL_TABLET | Freq: Every day | ORAL | Status: DC
  Administered 2021-06-29 – 2021-07-02 (×4): 75 mg via ORAL
  Filled 2021-06-29 (×4): qty 1

## 2021-06-29 MED ORDER — ACETAMINOPHEN 325 MG PO TABS
650.0000 mg | ORAL_TABLET | Freq: Four times a day (QID) | ORAL | Status: DC | PRN
  Administered 2021-06-29 – 2021-07-02 (×5): 650 mg via ORAL
  Filled 2021-06-29 (×6): qty 2

## 2021-06-29 MED ORDER — TRAMADOL HCL 50 MG PO TABS: 50.0000 mg | ORAL_TABLET | Freq: Four times a day (QID) | ORAL | Status: DC | PRN

## 2021-06-29 NOTE — NC FL2 (Signed)
?[place] MEDICAID FL2 LEVEL OF CARE SCREENING TOOL  ?  ? ?IDENTIFICATION  ?Patient Name: ?Jenna Young Birthdate: 09/25/1931 Sex: female Admission Date (Current Location): ?06/27/2021  ?[place] and [place] Number: ? [place] ?  Facility and Address:  ?[facility], [address], [place], [place] [address] ?     Provider Number: ?[id number]  ?Attending Physician Name and Address:  ?Fritzi Mandes, MD ? Relative Name and Phone Number:  ?Frazier Butt (Daughter)   [phone] [facility] Phone) ?   ?Current Level of Care: ?Hospital Recommended Level of Care: ?[place] Prior Approval Number: ?  ? ?Date Approved/Denied: ?  PASRR Number: ?[id number] A ? ?Discharge Plan: ?  ?  ? ?Current Diagnoses: ?Patient Active Problem List  ? Diagnosis Date Noted  ? Hip fracture ([place]) 06/27/2021  ? Poor balance 05/18/2021  ? Great toe pain, right 05/18/2021  ? Urinary frequency 03/15/2021  ? Intertrigo labialis 03/15/2021  ? Frequent urination 08/24/2020  ? History of COVID-19 08/03/2020  ? Dermatitis 05/03/2020  ? Secondary hypertension   ? Iron deficiency anemia 05/12/2019  ? Prediabetes 04/15/2018  ? Encounter for screening mammogram for breast cancer 04/01/2017  ? H/O syncope 02/28/2017  ? Coronary artery disease of native artery of native heart with stable angina pectoris ([place]) 02/21/2017  ? History of non-ST elevation myocardial infarction (NSTEMI) 02/10/2017  ? History of breast cancer 02/26/2016  ? Varicose vein of leg 10/09/2015  ? Fatigue 10/09/2015  ? Pedal edema 10/09/2015  ? Heart murmur 10/09/2015  ? History of spinal fracture 05/07/2015  ? Dysthymia 09/20/2013  ? Encounter for Medicare annual wellness exam 02/03/2013  ? Vaginal itching 04/01/2012  ? Vitamin D deficiency 08/19/2008  ? ANXIETY 01/29/2008  ? Essential hypertension, benign 09/25/2007  ? B12 deficiency 09/24/2006  ? Hyperlipidemia 09/24/2006  ? Venous (peripheral) insufficiency 09/24/2006  ? ALLERGIC RHINITIS  09/24/2006  ? GERD 09/24/2006  ? Osteoporosis 09/24/2006  ? ? ?Orientation RESPIRATION BLADDER Height & Weight   ?  ?Self, Place ? O2 Continent Weight: 138 lb 0.1 oz (62.6 kg) ?Height:  5' (152.4 cm)  ?BEHAVIORAL SYMPTOMS/MOOD NEUROLOGICAL BOWEL NUTRITION STATUS  ?      Diet (regular)  ?AMBULATORY STATUS COMMUNICATION OF NEEDS Skin   ?Limited Assist Verbally Surgical wounds (L hip) ?  ?  ?  ?    ?     ?     ? ? ?Personal Care Assistance Level of Assistance  ?Bathing, Feeding, Dressing Bathing Assistance: Limited assistance ?Feeding assistance: Limited assistance ?Dressing Assistance: Limited assistance ?   ? ?Functional Limitations Info  ?    ?  ?   ? ? ?SPECIAL CARE FACTORS FREQUENCY  ?PT (By licensed PT), OT (By licensed OT)   ?  ?PT Frequency: 5 times per week ?OT Frequency: 5 times per week ?  ?  ?  ?   ? ? ?Contractures    ? ? ?Additional Factors Info  ?Code Status, Allergies Code Status Info: full ?Allergies Info: Buspirone Hcl, Codeine, Hydrochlorothiazide, Iron, Lansoprazole, Minocycline Hcl, Penicillins, Phenergan (Promethazine Hcl), Ramipril, Ranexa (Ranolazine), Sulfa Antibiotics, Rosuvastatin ?  ?  ?  ?   ? ?Current Medications (06/29/2021):  This is the current hospital active medication list ?Current Facility-Administered Medications  ?Medication Dose Route Frequency Provider Last Rate Last Admin  ? carvedilol (COREG) tablet 3.125 mg  3.125 mg Oral BID WC Renee Harder, MD   3.125 mg at 06/29/21 3710  ? ceFAZolin (ANCEF) IVPB 2g/100 mL premix  2 g Intravenous Essie Hart, MD 200 mL/hr at 06/29/21 0542 2 g at 06/29/21 0542  ? cholecalciferol (VITAMIN D3) tablet 2,000 Units  2,000 Units Oral Daily Renee Harder, MD   2,000 Units at 06/29/21 [phone]  ? clopidogrel (PLAVIX) tablet 75 mg  75 mg Oral Daily Fritzi Mandes, MD      ? docusate sodium (COLACE) capsule 200 mg  200 mg Oral BID Renee Harder, MD   200 mg at 06/29/21 8588  ? enoxaparin (LOVENOX) injection 40 mg  40 mg Subcutaneous QPC  breakfast Renee Harder, MD   40 mg at 06/29/21 5027  ? feeding supplement (ENSURE ENLIVE / ENSURE PLUS) liquid 237 mL  237 mL Oral BID BM Renee Harder, MD   237 mL at 06/29/21 1126  ? hydrALAZINE (APRESOLINE) injection 20 mg  20 mg Intravenous Q6H PRN Renee Harder, MD      ? HYDROcodone-acetaminophen (NORCO/VICODIN) 5-325 MG per tablet 1-2 tablet  1-2 tablet Oral Q6H PRN Renee Harder, MD   2 tablet at 06/29/21 [phone]  ? isosorbide mononitrate (IMDUR) 24 hr tablet 15 mg  15 mg Oral Daily Renee Harder, MD   15 mg at 06/29/21 1125  ? losartan (COZAAR) tablet 100 mg  100 mg Oral Daily Renee Harder, MD   100 mg at 06/29/21 1125  ? morphine (PF) 2 MG/ML injection 1 mg  1 mg Intravenous Q4H PRN Renee Harder, MD   1 mg at 06/28/21 1430  ? multivitamin with minerals tablet 1 tablet  1 tablet Oral Daily Renee Harder, MD   1 tablet at 06/29/21 8786  ? nitroGLYCERIN (NITROSTAT) SL tablet 0.4 mg  0.4 mg Sublingual Q5 min PRN Renee Harder, MD      ? ondansetron [facility]) injection 4 mg  4 mg Intravenous Q6H PRN Renee Harder, MD   4 mg at 06/28/21 1549  ? pantoprazole (PROTONIX) EC tablet 40 mg  40 mg Oral Daily Renee Harder, MD   40 mg at 06/29/21 7672  ? polyethylene glycol (MIRALAX / GLYCOLAX) packet 17 g  17 g Oral Daily PRN Renee Harder, MD      ? ? ? ?Discharge Medications: ?Please see discharge summary for a list of discharge medications. ? ?Relevant Imaging Results: ? ?Relevant Lab Results: ? ? ?Additional Information ?SS #: [phone] ? ?Noura Purpura E Archer Vise, LCSW ? ? ? ? ?

## 2021-06-29 NOTE — TOC Initial Note (Addendum)
Transition of Care (TOC) - Initial/Assessment Note  ? ? ?Patient Details  ?Name: Jenna Young ?MRN: [id number] ?Date of Birth: 1931/06/29 ? ?Transition of Care (TOC) CM/SW Contact:    ?Archer Moist E Charrie Mcconnon, LCSW ?Phone Number: ?06/29/2021, 12:38 PM ? ?Clinical Narrative:             Per chart review, patient is disoriented. CSW spoke to patient's son Shanon Brow via phone. ?Patient lives alone. At baseline, drives herself to appointments. PCP is Dr. Glori Bickers. [facility] No DME or SNF history.  ?Shanon Brow says they are agreeable to SNF, would prefer [facility] or [facility]. ?CSW is starting SNF work up. Asked [facility] to review.  ? ? ?1:45- Per Seth Bake at [facility], they do not have beds.  ?Per Tiffany at [facility], she had some insurance questions. Asked her to call patient's son.  ? ? ?1:50- Tiffany at [facility] has made a bed offer and can take patient Monday at the earliest. Updated son.  ? ?Expected Discharge Plan: [place] ?Barriers to Discharge: Continued Medical Work up ? ? ?Patient Goals and CMS Choice ?Patient states their goals for this hospitalization and ongoing recovery are:: SNF ?CMS Medicare.gov Compare Post Acute Care list provided to:: Patient Represenative (must comment) ?Choice offered to / list presented to : Adult Children ? ?Expected Discharge Plan and Services ?Expected Discharge Plan: [place] ?  ?  ?  ?Living arrangements for the past 2 months: [place] ?                ?  ?  ?  ?  ?  ?  ?  ?  ?  ?  ? ?Prior Living Arrangements/Services ?Living arrangements for the past 2 months: [place] ?Lives with:: Self ?Patient language and need for interpreter reviewed:: Yes ?Do you feel safe going back to the place where you live?: No   needs short term rehab  ?Need for Family Participation in Patient Care: Yes (Comment) ?Care giver support system in place?: Yes (comment) ?  ?Criminal Activity/Legal  Involvement Pertinent to Current Situation/Hospitalization: No - Comment as needed ? ?Activities of Daily Living ?Home Assistive Devices/Equipment: None ?ADL Screening (condition at time of admission) ?Patient's cognitive ability adequate to safely complete daily activities?: Yes ?Is the patient deaf or have difficulty hearing?: Yes ?Does the patient have difficulty seeing, even when wearing glasses/contacts?: No ?Does the patient have difficulty concentrating, remembering, or making decisions?: No ?Patient able to express need for assistance with ADLs?: Yes ?Does the patient have difficulty dressing or bathing?: No ?Independently performs ADLs?: Yes (appropriate for developmental age) ?Does the patient have difficulty walking or climbing stairs?: No ?Weakness of Legs: None ?Weakness of Arms/Hands: None ? ?Permission Sought/Granted ?Permission sought to share information with : Customer service manager ?Permission granted to share information with : Yes, Verbal Permission Granted (by son Shanon Brow) ?   ? Permission granted to share info w AGENCY: SNF ?   ?   ? ?Emotional Assessment ?  ?  ?  ?Orientation: : Fluctuating Orientation (Suspected and/or reported [place]) ?Alcohol / Substance Use: Not Applicable ?Psych Involvement: No (comment) ? ?Admission diagnosis:  Hip fracture ([place]) [S72.009A] ?Fall, initial encounter [W19.XXXA] ?Closed left hip fracture, initial encounter ([place]) [S72.002A] ?Patient Active Problem List  ? Diagnosis Date Noted  ? Hip fracture ([place]) 06/27/2021  ? Poor balance 05/18/2021  ? Great toe pain, right 05/18/2021  ? Urinary frequency  03/15/2021  ? Intertrigo labialis 03/15/2021  ? Frequent urination 08/24/2020  ? History of COVID-19 08/03/2020  ? Dermatitis 05/03/2020  ? Secondary hypertension   ? Iron deficiency anemia 05/12/2019  ? Prediabetes 04/15/2018  ? Encounter for screening mammogram for breast cancer 04/01/2017  ? H/O syncope 02/28/2017  ? Coronary artery disease of native artery  of native heart with stable angina pectoris ([place]) 02/21/2017  ? History of non-ST elevation myocardial infarction (NSTEMI) 02/10/2017  ? History of breast cancer 02/26/2016  ? Varicose vein of leg 10/09/2015  ? Fatigue 10/09/2015  ? Pedal edema 10/09/2015  ? Heart murmur 10/09/2015  ? History of spinal fracture 05/07/2015  ? Dysthymia 09/20/2013  ? Encounter for Medicare annual wellness exam 02/03/2013  ? Vaginal itching 04/01/2012  ? Vitamin D deficiency 08/19/2008  ? ANXIETY 01/29/2008  ? Essential hypertension, benign 09/25/2007  ? B12 deficiency 09/24/2006  ? Hyperlipidemia 09/24/2006  ? Venous (peripheral) insufficiency 09/24/2006  ? ALLERGIC RHINITIS 09/24/2006  ? GERD 09/24/2006  ? Osteoporosis 09/24/2006  ? ?PCP:  Abner Greenspan, MD ?Pharmacy:   ?[id number] [address], [address] ?[address] ?[place] [address] ?Phone: [phone] Fax: [phone] ? ? ? ? ?Social Determinants of Health (SDOH) Interventions ?  ? ?Readmission Risk Interventions ? ?  06/29/2021  ? 12:36 PM  ?Readmission Risk Prevention Plan  ?Post Dischage Appt Complete  ?Medication Screening Complete  ?Transportation Screening Complete  ? ? ? ?

## 2021-06-29 NOTE — Progress Notes (Signed)
[name] at [facility] ? ? ?PATIENT NAME: Jenna Young   ? ?MR#:  [id number] ? ?DATE OF BIRTH:  Apr 04, 1931 ? ?SUBJECTIVE:  ?POD #1 doing well  ?family at bedside. ? ?No complaints ?VITALS:  ?Blood pressure 130/86, pulse 73, temperature 98.4 ?F (36.9 ?C), temperature source Oral, resp. rate 16, height 5' (1.524 m), weight 62.6 kg, SpO2 97 %. ? ?PHYSICAL EXAMINATION:  ? ?GENERAL:  88 y.o.-year-old patient lying in the bed with no acute distress.  ?LUNGS: Normal breath sounds bilaterally, no wheezing, rales, rhonchi.  ?CARDIOVASCULAR: S1, S2 normal. No murmurs, rubs, or gallops.  ?ABDOMEN: Soft, nontender, nondistended. Bowel sounds present.  ?EXTREMITIES: decreased ROM ?NEUROLOGIC: nonfocal  patient is alert and awake ?SKIN: No obvious rash, lesion, or ulcer.  ? ?LABORATORY PANEL:  ?CBC ?Recent Labs  ?Lab 06/28/21 ?2952 06/29/21 ?1009  ?WBC 9.2  --   ?HGB 10.2* 9.3*  ?HCT 31.3*  --   ?PLT 155  --   ? ? ? ?Chemistries  ?Recent Labs  ?Lab 06/27/21 ?1655 06/28/21 ?0552  ?NA 133* 130*  ?K 3.8 4.1  ?CL 101 105  ?CO2 22 22  ?GLUCOSE 117* 115*  ?BUN 12 11  ?CREATININE 0.92 0.81  ?CALCIUM 9.3 8.2*  ?AST 25  --   ?ALT 17  --   ?ALKPHOS 66  --   ?BILITOT 0.7  --   ? ? ?Cardiac Enzymes ?No results for input(s): TROPONINI in the last 168 hours. ?RADIOLOGY:  ?CT HEAD WO CONTRAST (5MM) ? ?Result Date: 06/27/2021 ?CLINICAL DATA:  Trauma, fall EXAM: CT HEAD WITHOUT CONTRAST TECHNIQUE: Contiguous axial images were obtained from the base of the skull through the vertex without intravenous contrast. RADIATION DOSE REDUCTION: This exam was performed according to the departmental dose-optimization program which includes automated exposure control, adjustment of the mA and/or kV according to patient size and/or use of iterative reconstruction technique. COMPARISON:  02/28/2017 FINDINGS: Brain: There is prominence of third and both lateral ventricles. Cortical sulci are prominent. No acute intracranial findings  are seen. There is decreased density in the periventricular and subcortical white matter. Vascular: Scattered arterial calcifications are seen. Skull: Unremarkable. Sinuses/Orbits: There is mucosal thickening in the ethmoid and sphenoid sinuses. Other: No significant interval changes are noted. IMPRESSION: No acute intracranial findings are seen in noncontrast CT brain. Atrophy. Small-vessel disease. Chronic sinusitis. Electronically Signed   By: Elmer Picker M.D.   On: 06/27/2021 17:03  ? ?CT Cervical Spine Wo Contrast ? ?Result Date: 06/27/2021 ?CLINICAL DATA:  Neck trauma (Age >= 65y) EXAM: CT CERVICAL SPINE WITHOUT CONTRAST TECHNIQUE: Multidetector CT imaging of the cervical spine was performed without intravenous contrast. Multiplanar CT image reconstructions were also generated. RADIATION DOSE REDUCTION: This exam was performed according to the departmental dose-optimization program which includes automated exposure control, adjustment of the mA and/or kV according to patient size and/or use of iterative reconstruction technique. COMPARISON:  None. FINDINGS: Alignment: No substantial sagittal subluxation. Skull base and vertebrae: No evidence of acute fracture. Osteopenia. Soft tissues and spinal canal: No prevertebral fluid or swelling. No visible canal hematoma. Disc levels: Multilevel facet uncovertebral hypertrophy then agrees neural foraminal stenosis. Mild for age multilevel degenerative disease. Upper chest: Biapical pleuroparenchymal scarring. IMPRESSION: 1. No evidence of acute fracture or traumatic malalignment. 2. Osteopenia. Electronically Signed   By: Margaretha Sheffield M.D.   On: 06/27/2021 17:02  ? ?DG Pelvis Portable ? ?Result Date: 06/28/2021 ?CLINICAL DATA:  Postoperative EXAM: PORTABLE PELVIS 1-2 VIEWS COMPARISON:  06/27/2021 FINDINGS: Interval left total hip arthroplasty. No perihardware lucency or fracture. Prosthetic components are in near anatomic alignment. Air within the left hip  soft tissues is not unexpected postoperatively. Superficial skin staples. Redemonstrated screw fixation of the right femoral neck. IMPRESSION: Expected postoperative appearance, status post left total hip arthroplasty. Electronically Signed   By: Merilyn Baba M.D.   On: 06/28/2021 18:53  ? ?DG Chest Port 1 View ? ?Result Date: 06/27/2021 ?CLINICAL DATA:  [place], left hip fracture EXAM: PORTABLE CHEST 1 VIEW COMPARISON:  05/21/2019 FINDINGS: Single frontal view of the chest demonstrates an enlarged cardiac silhouette. There is diffuse interstitial scarring without focal airspace disease, effusion, or pneumothorax. No acute bony abnormalities. IMPRESSION: 1. Chronic scarring, no acute intrathoracic process. Electronically Signed   By: Randa Ngo M.D.   On: 06/27/2021 16:48  ? ?DG Hip Unilat W or Wo Pelvis 2-3 Views Left ? ?Result Date: 06/27/2021 ?CLINICAL DATA:  Loss of balance, fell, left-sided buttock pain EXAM: DG HIP (WITH OR WITHOUT PELVIS) 2-3V LEFT COMPARISON:  None. FINDINGS: Frontal view of the pelvis as well as frontal and cross-table lateral views of the left hip are obtained. There is an impacted subcapital left femoral neck fracture with varus angulation at the fracture site. No dislocation. Prior postsurgical changes of the right hip with 4 cannulated screws traversing the right femoral neck. Remainder of the bony pelvis is unremarkable. IMPRESSION: 1. Acute impacted subcapital left femoral neck fracture. Electronically Signed   By: Randa Ngo M.D.   On: 06/27/2021 16:47   ? ?Assessment and Plan ? ?88 y/o F w/ PMH of HTN, HLD, CAD, GERD, breast cancer (in remission for at least 5 years) who presents a fall at [facility]. Pt stated she tripped over the sidewalk and fell on left hip. ? ?Left hip fracture: secondary to pt tripping on sidewalk at [facility]. --prn Norco, IV morphine prn for pain.  ?--NPO after midnight as per ortho surg.  ?--Orthopedic consult with Dr. Sharlet Salina. ?-- DVT orders per  orthopedic--lovenox qd x 14 days ?-- PT OT to see today ?-- TOC for discharge planning ?--hgb 9.3 ?  ?HTN:  ?--continue on home dose of carvedilol, imdur, losartan. IV hydralazine prn ?  ?HLD: cont rosuvastatin   ? ?Hx of CAD: continue on carvedilol, imdur & losartan.  ?-- EKG shows sinus rhythm with left axis deviation, LBBB ?-- no chest pain or shortness of breath. ?-- Follows Dr. Rockey Situ as outpatient ?-- resume plavix ? ?GERD: continue on PPI  ?  ?Hyponatremia: mild ?  ? ? ? ?Procedures: left hemiarthroplasty ?Family communication : daughter/niece at bedside ?Consults : orthopedic ?CODE STATUS: full ?DVT Prophylaxis : lovenox ?Level of care: Med-Surg ?Status is: inpatient ?Hip fracture pending surgery ? ?  ? ?TOTAL TIME TAKING CARE OF THIS PATIENT: 30 minutes.  ?>50% time spent on counselling and coordination of care ? ?Note: This dictation was prepared with Dragon dictation along with smaller phrase technology. Any transcriptional errors that result from this process are unintentional. ? ?Fritzi Mandes M.D  ? ? ?[facility]  ? ?CC: ?Primary care physician; Tower, [name], MD  ?

## 2021-06-29 NOTE — Evaluation (Signed)
Occupational Therapy Evaluation ?Patient Details ?Name: Jenna Young [facility] ?MRN: [id number] ?DOB: 02-Aug-1931 ?Today's Date: 06/29/2021 ? ? ?History of Present Illness Pt is an 88 y/o F w/ PMH of HTN, HLD, CAD, GERD, breast cancer (in remission for at least 5 years) who presents a fall at [facility]. Pt stated she tripped over the sidewalk and fell on left hip. Now s/p hemiarthroplasty.  ? ?Clinical Impression ?  ?Pt seen for OT evaluation this date, POD#1 from above surgery. Pt was independent in all ADL prior to surgery and is eager to return to PLOF with less pain and improved safety and independence. Pt currently requires MOD-MAX A for LB ADL tasks, MOD A for ADL transfers with VC for RW mgt/hand/foot placement/maintaining precautions, and pt appears somewhat anxious/fearful of pain. Pt unable to recall any posterior total hip precautions at start of session and unable to verbalize how to implement during ADL and mobility. Pt instructed in posterior total hip precautions and how to implement, falls prevention strategies, home/routines modifications, RW mgt, and ADL transfer techniques. At end of session, pt still unable to recall any posterior total hip precautions, however knows she needs pillows between her legs once back in bed. No family present. Pt would benefit from additional instruction in self care skills and techniques to help maintain precautions with or without assistive devices to support recall and carryover prior to discharge. Recommend SNF upon discharge for short term rehab in order to maximize safety/return to PLOF.    ?   ? ?Recommendations for follow up therapy are one component of a multi-disciplinary discharge planning process, led by the attending physician.  Recommendations may be updated based on patient status, additional functional criteria and insurance authorization.  ? ?Follow Up Recommendations ? Skilled nursing-short term rehab (<3 hours/day)  ?  ?Assistance Recommended at Discharge  Frequent or constant Supervision/Assistance  ?Patient can return home with the following A lot of help with walking and/or transfers;A lot of help with bathing/dressing/bathroom;Assistance with cooking/housework;Assist for transportation;Direct supervision/assist for medications management;Help with stairs or ramp for entrance ? ?  ?Functional Status Assessment ? Patient has had a recent decline in their functional status and demonstrates the ability to make significant improvements in function in a reasonable and predictable amount of time.  ?Equipment Recommendations ? BSC/3in1;Other (comment) (2WW)  ?  ?Recommendations for Other Services   ? ? ?  ?Precautions / Restrictions Precautions ?Precautions: Fall;Posterior Hip ?Precaution Booklet Issued: Yes (comment) ?Restrictions ?Weight Bearing Restrictions: Yes ?LLE Weight Bearing: Weight bearing as tolerated  ? ?  ? ?Mobility Bed Mobility ?Overal bed mobility: Needs Assistance ?Bed Mobility: Sit to Supine ?  ?  ?  ?Sit to supine: Mod assist ?  ?General bed mobility comments: MOD A for BLE mgt ?  ? ?Transfers ?Overall transfer level: Needs assistance ?Equipment used: Rolling walker (2 wheels) ?Transfers: Sit to/from Stand, Bed to chair/wheelchair/BSC ?Sit to Stand: Mod assist ?  ?  ?Step pivot transfers: Mod assist ?  ? Lateral/Scoot Transfers: Min guard ?General transfer comment: MAX VC for hand placement, foot placement, precautions ?  ? ?  ?Balance Overall balance assessment: Needs assistance ?Sitting-balance support: Feet supported ?Sitting balance-Leahy Scale: Fair ?  ?  ?Standing balance support: Reliant on assistive device for balance, Bilateral upper extremity supported ?Standing balance-Leahy Scale: Poor ?  ?  ?  ?  ?  ?  ?  ?  ?  ?  ?  ?  ?   ? ?ADL either performed or assessed with  clinical judgement  ? ?ADL Overall ADL's : Needs assistance/impaired ?  ?  ?  ?  ?  ?  ?  ?  ?  ?  ?  ?  ?  ?  ?  ?  ?  ?  ?  ?General ADL Comments: Pt currently requires MAX A  for LB ADL Tasks, MOD A for ADL transfers, VC for precautions  ? ? ? ?Vision   ?   ?   ?Perception   ?  ?Praxis   ?  ? ?Pertinent Vitals/Pain Pain Assessment ?Pain Assessment: Faces ?Pain Location: pt reports her L hip being sore with mobility ?Pain Descriptors / Indicators: Guarding, Grimacing ?Pain Intervention(s): Limited activity within patient's tolerance, Monitored during session, Repositioned, Ice applied  ? ? ? ?Hand Dominance Right ?  ?Extremity/Trunk Assessment Upper Extremity Assessment ?Upper Extremity Assessment: Overall WFL for tasks assessed ?  ?Lower Extremity Assessment ?Lower Extremity Assessment: Generalized weakness;LLE deficits/detail ?LLE Deficits / Details: s/p hip hemi ?LLE: Unable to fully assess due to pain ?  ?Cervical / Trunk Assessment ?Cervical / Trunk Assessment: Normal ?  ?Communication Communication ?Communication: No difficulties;HOH ?  ?Cognition Arousal/Alertness: Awake/alert ?Behavior During Therapy: [facility] for tasks assessed/performed ?Overall Cognitive Status: No family/caregiver present to determine baseline cognitive functioning ?  ?  ?  ?  ?  ?  ?  ?  ?  ?  ?  ?  ?  ?  ?  ?  ?General Comments: Pt alert, appears somewhat confused, requiring VC for safety/sequencing/precautions ?  ?  ?General Comments    ? ?  ?Exercises Other Exercises ?Other Exercises: Pt instructed in precautions and how to maintain, limited carryover during session requiring VC throughout to maintain (primarily attempts to bend past 90*), instructed in RW mgt and ADL transfers ?  ?Shoulder Instructions    ? ? ?Home Living Family/patient expects to be discharged to:: Private residence ?Living Arrangements: Alone ?Available Help at Discharge: Family;Available PRN/intermittently ?Type of Home: House ?Home Access: Stairs to enter ?Entrance Stairs-Number of Steps: 3 ?Entrance Stairs-Rails: Right ?Home Layout: One level ?  ?  ?Bathroom Shower/Tub: Tub/shower unit ?  ?Bathroom Toilet: Standard ?Bathroom  Accessibility: Yes ?  ?Home Equipment: Cane - single point;Grab bars - tub/shower ?  ?  ?  ? ?  ?Prior Functioning/Environment Prior Level of Function : Independent/Modified Independent;Driving ?  ?  ?  ?  ?  ?  ?  ?  ?  ? ?  ?  ?OT Problem List: Decreased strength;Pain;Decreased cognition;Decreased range of motion;Decreased safety awareness;Impaired balance (sitting and/or standing);Decreased knowledge of use of DME or AE;Decreased knowledge of precautions ?  ?   ?OT Treatment/Interventions: Self-care/ADL training;Therapeutic exercise;Therapeutic activities;DME and/or AE instruction;Patient/family education;Balance training;Cognitive remediation/compensation  ?  ?OT Goals(Current goals can be found in the care plan section) Acute Rehab OT Goals ?Patient Stated Goal: go home with family ?OT Goal Formulation: With patient ?Time For Goal Achievement: 07/13/21 ?Potential to Achieve Goals: Good ?ADL Goals ?Pt Will Perform Lower Body Dressing: with mod assist;with min assist;sit to/from stand ?Pt Will Transfer to Toilet: with min assist;bedside commode;ambulating (LRAD, maintaining posterior THPs) ?Pt Will Perform Toileting - Clothing Manipulation and hygiene: sitting/lateral leans;with set-up;with supervision ?Additional ADL Goal #1: Pt will complete seated bathing task with MIN A for LB bathing with PRN VC for maintaining posterior THPs. ?Additional ADL Goal #2: Pt will stand at the sink with CGA-MIN A to complete grooming task with cues for safety.  ?OT Frequency: Min 2X/week ?  ? ?  Co-evaluation   ?  ?  ?  ?  ? ?  ?AM-PAC OT "6 Clicks" Daily Activity     ?Outcome Measure Help from another person eating meals?: None ?Help from another person taking care of personal grooming?: A Little ?Help from another person toileting, which includes using toliet, bedpan, or urinal?: A Lot ?Help from another person bathing (including washing, rinsing, drying)?: A Lot ?Help from another person to put on and taking off regular upper  body clothing?: A Little ?Help from another person to put on and taking off regular lower body clothing?: A Lot ?6 Click Score: 16 ?  ?End of Session Equipment Utilized During Treatment: Rolling walker (2 wheels);Oxygen;Gai

## 2021-06-29 NOTE — Progress Notes (Signed)
Nutrition Follow-up ? ?DOCUMENTATION CODES:  ? ?Not applicable ? ?INTERVENTION:  ? ?-Continue Ensure Enlive po BID, each supplement provides 350 kcal and 20 grams of protein ?-Continue MVI with minerals daily ?-Liberalize diet to regular  ? ?NUTRITION DIAGNOSIS:  ? ?Increased nutrient needs related to post-op healing as evidenced by estimated needs. ? ?Ongoing ? ?GOAL:  ? ?Patient will meet greater than or equal to 90% of their needs ? ?Progressing  ? ?MONITOR:  ? ?PO intake, Supplement acceptance, Diet advancement, Labs, Weight trends, Skin, I & O's ? ?REASON FOR ASSESSMENT:  ? ?Consult ?Assessment of nutrition requirement/status ? ?ASSESSMENT:  ? ?88 y/o F w/ PMH of HTN, HLD, CAD, GERD, breast cancer (in remission for at least 5 years) who presents a fall at [facility]. Pt stated she tripped over the sidewalk and fell on left hip. Pt denies any proceeding symptoms. Pt c/o left hip that is dull, constant w/o radiation. Moving her leg makes the pain and nothing makes the pain better. The severity is currently 6/10. Pt does not currently want pain medication. Pt denies any fevers, chills, sweating, cough, chest pain, shortness of breath, nausea, vomiting, abd pain, diarrhea, or constipation. ? ?4/20- Procedure(s): ?ARTHROPLASTY BIPOLAR HIP (HEMIARTHROPLASTY) ? ?Reviewed I/O's: +1.6 L x 24 hours ? ?UOP: 450 ml x 24 hours  ? ?Pt currently on a heart healthy/ carb modified diet. No meal completion data currently available. RD will liberalize diet for wider variety of meal selections.  ? ?Medications reviewed and include vitamin D3 and colace.  ? ?Labs reviewed: Na: 130.   ? ?Diet Order:   ?Diet Order   ? ?       ?  Diet heart healthy/carb modified Room service appropriate? Yes; Fluid consistency: Thin  Diet effective now       ?  ? ?  ?  ? ?  ? ? ?EDUCATION NEEDS:  ? ?Education needs have been addressed ? ?Skin:  Skin Assessment: Skin Integrity Issues: ?Skin Integrity Issues:: Incisions ?Incisions: closed lt hip ? ?Last BM:   06/27/21 ? ?Height:  ? ?Ht Readings from Last 1 Encounters:  ?06/27/21 5' (1.524 m)  ? ? ?Weight:  ? ?Wt Readings from Last 1 Encounters:  ?06/27/21 62.6 kg  ? ? ?Ideal Body Weight:  45.5 kg ? ?BMI:  Body mass index is 26.95 kg/m?. ? ?Estimated Nutritional Needs:  ? ?Kcal:  1550-1750 ? ?Protein:  80-95 grams ? ?Fluid:  > 1.5 L ? ? ? ?Loistine Chance, RD, LDN, CDCES ?Registered Dietitian II ?Certified Diabetes Care and Education Specialist ?Please refer to [facility] for RD and/or RD on-call/weekend/after hours pager  ?

## 2021-06-29 NOTE — Evaluation (Signed)
Physical Therapy Evaluation ?Patient Details ?Name: Jenna Young [facility] ?MRN: [id number] ?DOB: 1931-04-02 ?Today's Date: 06/29/2021 ? ?History of Present Illness ? Pt is an 88 y/o F w/ PMH of HTN, HLD, CAD, GERD, breast cancer (in remission for at least 5 years) who presents a fall at [facility]. Pt stated she tripped over the sidewalk and fell on left hip. Now s/p hemiarthroplasty. ?  ?Clinical Impression ? Pt alert, agreeable to PT, denied pain throughout session. At baseline the patient is independent in IADLs, ADLs, lives alone.  ? ?She was able to perform a few supine exercises with tactile cues, some assist for LLE. Supine to sit with minA, to really scoot and reposition at EOB in prep for standing. Sit <> stand from [place] with RW, from recliner with BUE support, minA. She was able to pivot to recliner, but then also ambulate ~70f with step to gait pattern, and step by step sequencing, close chair follow.  Overall the patient demonstrated deficits (see "PT Problem List") that impede the patient's functional abilities, safety, and mobility and would benefit from skilled PT intervention. Recommendation is SNF due to current level of assistance needed and decline from PLOF. ? ?Of note patient on room air, spO2 ranging 86-90% on room air, placed on 2L for mobility, and RN aware on 2L at end of session.    ?   ? ?Recommendations for follow up therapy are one component of a multi-disciplinary discharge planning process, led by the attending physician.  Recommendations may be updated based on patient status, additional functional criteria and insurance authorization. ? ?Follow Up Recommendations Skilled nursing-short term rehab (<3 hours/day) ? ?  ?Assistance Recommended at Discharge Frequent or constant Supervision/Assistance  ?Patient can return home with the following ? A lot of help with walking and/or transfers;A lot of help with bathing/dressing/bathroom;Assist for transportation;Assistance with cooking/housework;Help  with stairs or ramp for entrance ? ?  ?Equipment Recommendations Rolling walker (2 wheels);BSC/3in1  ?Recommendations for Other Services ?    ?  ?Functional Status Assessment Patient has had a recent decline in their functional status and demonstrates the ability to make significant improvements in function in a reasonable and predictable amount of time.  ? ?  ?Precautions / Restrictions Precautions ?Precautions: Fall;Posterior Hip ?Precaution Booklet Issued: No ?Restrictions ?Weight Bearing Restrictions: Yes ?LLE Weight Bearing: Weight bearing as tolerated  ? ?  ? ?Mobility ? Bed Mobility ?Overal bed mobility: Needs Assistance ?Bed Mobility: Supine to Sit ?  ?  ?Supine to sit: Min assist, HOB elevated ?  ?  ?  ?  ? ?Transfers ?Overall transfer level: Needs assistance ?Equipment used: Rolling walker (2 wheels) ?Transfers: Sit to/from Stand ?Sit to Stand: Mod assist, Min assist ?  ?  ?  ?  ?  ?General transfer comment: initially modA, with BUE support able to perform with minA ?  ? ?Ambulation/Gait ?Ambulation/Gait assistance: Min assist ?Gait Distance (Feet): 5 Feet ?Assistive device: Rolling walker (2 wheels) ?  ?  ?  ?  ?General Gait Details: step to gait pattern, minA to assist off weight LLE while ambulating. step by step sequencing ? ?Stairs ?  ?  ?  ?  ?  ? ?Wheelchair Mobility ?  ? ?Modified Rankin (Stroke Patients Only) ?  ? ?  ? ?Balance Overall balance assessment: Needs assistance ?Sitting-balance support: Feet supported ?Sitting balance-Leahy Scale: Fair ?  ?  ?Standing balance support: Reliant on assistive device for balance ?Standing balance-Leahy Scale: Poor ?  ?  ?  ?  ?  ?  ?  ?  ?  ?  ?  ?  ?   ? ? ? ?  Pertinent Vitals/Pain Pain Assessment ?Pain Assessment: No/denies pain  ? ? ?Home Living Family/patient expects to be discharged to:: Private residence ?Living Arrangements: Alone ?Available Help at Discharge: Family;Available PRN/intermittently ?Type of Home: House ?Home Access: Stairs to  enter ?Entrance Stairs-Rails: Right ?Entrance Stairs-Number of Steps: 3 ?  ?Home Layout: One level ?Home Equipment: Cane - single point;Grab bars - tub/shower ?   ?  ?Prior Function Prior Level of Function : Independent/Modified Independent;Driving ?  ?  ?  ?  ?  ?  ?  ?  ?  ? ? ?Hand Dominance  ? Dominant Hand: Right ? ?  ?Extremity/Trunk Assessment  ? Upper Extremity Assessment ?Upper Extremity Assessment: Overall WFL for tasks assessed ?  ? ?Lower Extremity Assessment ?Lower Extremity Assessment: Generalized weakness (able to move both LE's against gravity) ?  ? ?Cervical / Trunk Assessment ?Cervical / Trunk Assessment: Normal  ?Communication  ? Communication: No difficulties;HOH  ?Cognition Arousal/Alertness: Awake/alert ?Behavior During Therapy: [facility] for tasks assessed/performed ?Overall Cognitive Status: Within Functional Limits for tasks assessed ?  ?  ?  ?  ?  ?  ?  ?  ?  ?  ?  ?  ?  ?  ?  ?  ?  ?  ?  ? ?  ?General Comments   ? ?  ?Exercises    ? ?Assessment/Plan  ?  ?PT Assessment Patient needs continued PT services  ?PT Problem List Decreased strength;Decreased mobility;Decreased range of motion;Decreased knowledge of precautions;Decreased activity tolerance;Decreased balance;Pain;Decreased knowledge of use of DME ? ?   ?  ?PT Treatment Interventions DME instruction;Therapeutic exercise;Gait training;Balance training;Stair training;Neuromuscular re-education;Functional mobility training;Therapeutic activities;Patient/family education   ? ?PT Goals (Current goals can be found in the Care Plan section)  ?Acute Rehab PT Goals ?Patient Stated Goal: to go home ?PT Goal Formulation: With patient ?Time For Goal Achievement: 07/13/21 ?Potential to Achieve Goals: Good ? ?  ?Frequency 7X/week ?  ? ? ?Co-evaluation   ?  ?  ?  ?  ? ? ?  ?AM-PAC PT "6 Clicks" Mobility  ?Outcome Measure Help needed turning from your back to your side while in a flat bed without using bedrails?: A Little ?Help needed moving from lying on  your back to sitting on the side of a flat bed without using bedrails?: A Lot ?Help needed moving to and from a bed to a chair (including a wheelchair)?: A Lot ?Help needed standing up from a chair using your arms (e.g., wheelchair or bedside chair)?: A Lot ?Help needed to walk in hospital room?: A Lot ?Help needed climbing 3-5 steps with a railing? : Total ?6 Click Score: 12 ? ?  ?End of Session Equipment Utilized During Treatment: Gait belt ?Activity Tolerance: Patient tolerated treatment well ?Patient left: in chair;with call bell/phone within reach ?Nurse Communication: Mobility status ?PT Visit Diagnosis: Other abnormalities of gait and mobility (R26.89);Difficulty in walking, not elsewhere classified (R26.2);Muscle weakness (generalized) (M62.81);Pain ?Pain - Right/Left: Left ?Pain - part of body: Hip ?  ? ?Time: 8280-0349 ?PT Time Calculation (min) (ACUTE ONLY): 34 min ? ? ?Charges:   PT Evaluation ?$PT Eval Low Complexity: 1 Low ?PT Treatments ?$Therapeutic Activity: 23-37 mins ?  ?   ? ? ?Lieutenant Diego PT, DPT ?12:29 PM,06/29/21 ? ? ?

## 2021-06-30 DIAGNOSIS — R410 Disorientation, unspecified: Secondary | ICD-10-CM | POA: Diagnosis not present

## 2021-06-30 DIAGNOSIS — S72002A Fracture of unspecified part of neck of left femur, initial encounter for closed fracture: Secondary | ICD-10-CM | POA: Diagnosis not present

## 2021-06-30 LAB — GLUCOSE, CAPILLARY: Glucose-Capillary: 134 mg/dL — ABNORMAL HIGH (ref 70–99)

## 2021-06-30 MED ORDER — HYDROCODONE-ACETAMINOPHEN 5-325 MG PO TABS: 1.0000 | ORAL_TABLET | Freq: Four times a day (QID) | ORAL | Status: DC | PRN

## 2021-06-30 MED ORDER — TRAMADOL HCL 50 MG PO TABS: 50.0000 mg | ORAL_TABLET | Freq: Four times a day (QID) | ORAL | Status: DC | PRN

## 2021-06-30 MED ORDER — HYDROCODONE-ACETAMINOPHEN 5-325 MG PO TABS: 1.0000 | ORAL_TABLET | Freq: Three times a day (TID) | ORAL | Status: DC | PRN

## 2021-06-30 MED ORDER — SODIUM CHLORIDE 0.9 % IV BOLUS
500.0000 mL | Freq: Once | INTRAVENOUS | Status: AC
  Administered 2021-06-30: 500 mL via INTRAVENOUS

## 2021-06-30 NOTE — Progress Notes (Signed)
Physical Therapy Treatment ?Patient Details ?Name: Jenna Young [facility] ?MRN: [id number] ?DOB: 1932-01-25 ?Today's Date: 06/30/2021 ? ? ?History of Present Illness 88 y/o Female w/ PMH of HTN, HLD, CAD, GERD, breast cancer (in remission for at least 5 years) who presents a [facility] at [facility], admitted [facility]. Pt stated she tripped over the sidewalk and fell on left hip. Now s/p hemiarthroplasty. ? ?  ?PT Comments  ? ? Pt is having a challenging morning, initially very excited per MD report to get OOB but is struggling to move.  Pt is lethargic, once on side of bed is drifting backward.  Her effort on walker to stand and sidestep to chair his a struggle to move legs, but finally required more direct assist to get to the chair.  Follow along with her to get walking and SNF plan is still very accurate and appropriate due to her assistance needed, safety awareness, length of time with hip precautions and effort able to generate.  Pt knew 1/3 precautions but could prompt one more with cues.  Follow acute PT plan of care.   ?Recommendations for follow up therapy are one component of a multi-disciplinary discharge planning process, led by the attending physician.  Recommendations may be updated based on patient status, additional functional criteria and insurance authorization. ? ?Follow Up Recommendations ? Skilled nursing-short term rehab (<3 hours/day) ?  ?  ?Assistance Recommended at Discharge Frequent or constant Supervision/Assistance  ?Patient can return home with the following A lot of help with walking and/or transfers;A lot of help with bathing/dressing/bathroom;Assist for transportation;Assistance with cooking/housework;Help with stairs or ramp for entrance ?  ?Equipment Recommendations ? Rolling walker (2 wheels);BSC/3in1  ?  ?Recommendations for Other Services   ? ? ?  ?Precautions / Restrictions Precautions ?Precautions: Fall;Posterior Hip ?Precaution Booklet Issued: Yes (comment) ?Restrictions ?LLE Weight Bearing: Weight  bearing as tolerated ?Other Position/Activity Restrictions: posterior precautions  ?  ? ?Mobility ? Bed Mobility ?Overal bed mobility: Needs Assistance ?Bed Mobility: Supine to Sit ?  ?  ?Supine to sit: Mod assist ?  ?  ?General bed mobility comments: mod assist with pt following instructions slowly ?  ? ?Transfers ?Overall transfer level: Needs assistance ?Equipment used: Rolling walker (2 wheels) ?Transfers: Sit to/from Stand, Bed to chair/wheelchair/BSC ?Sit to Stand: Max assist ?  ?Step pivot transfers: Mod assist, Max assist ?  ?  ?  ?General transfer comment: pt is slow to respond to cues for LE movement, requires more direct assist to get to chair today ?  ? ?Ambulation/Gait ?  ?  ?  ?  ?  ?  ?  ?General Gait Details: steps were to get to chair ? ? ?Stairs ?  ?  ?  ?  ?  ? ? ?Wheelchair Mobility ?  ? ?Modified Rankin (Stroke Patients Only) ?  ? ? ?  ?Balance Overall balance assessment: Needs assistance ?Sitting-balance support: Feet supported ?Sitting balance-Leahy Scale: Poor ?Sitting balance - Comments: balance sitting was poor as pt is listing backward from side of bed ?  ?  ?  ?  ?  ?  ?  ?  ?  ?  ?  ?  ?  ?  ?  ?  ? ?  ?Cognition Arousal/Alertness: Awake/alert ?Behavior During Therapy: Flat affect ?Overall Cognitive Status: No family/caregiver present to determine baseline cognitive functioning ?  ?  ?  ?  ?  ?  ?  ?  ?  ?  ?  ?  ?  ?  ?  ?  ?  General Comments: unclear if confusion is baseline ?  ?  ? ?  ?Exercises   ? ?  ?General Comments General comments (skin integrity, edema, etc.): pt is unsafe to move today, more lethargic perhaps than yesterday (not previously seen by this PT) ?  ?  ? ?Pertinent Vitals/Pain Pain Assessment ?Pain Assessment: Faces ?Faces Pain Scale: Hurts a little bit ?Pain Location: pt reports her L hip being sore with mobility ?Pain Descriptors / Indicators: Guarding, Grimacing ?Pain Intervention(s): Limited activity within patient's tolerance, Monitored during session,  Premedicated before session, Repositioned  ? ? ?Home Living   ?  ?  ?  ?  ?  ?  ?  ?  ?  ?   ?  ?Prior Function    ?  ?  ?   ? ?PT Goals (current goals can now be found in the care plan section) Acute Rehab PT Goals ?Patient Stated Goal: to go home ? ?  ?Frequency ? ? ? 7X/week ? ? ? ?  ?PT Plan Current plan remains appropriate  ? ? ?Co-evaluation   ?  ?  ?  ?  ? ?  ?AM-PAC PT "6 Clicks" Mobility   ?Outcome Measure ? Help needed turning from your back to your side while in a flat bed without using bedrails?: A Little ?Help needed moving from lying on your back to sitting on the side of a flat bed without using bedrails?: A Lot ?Help needed moving to and from a bed to a chair (including a wheelchair)?: A Lot ?Help needed standing up from a chair using your arms (e.g., wheelchair or bedside chair)?: A Lot ?Help needed to walk in hospital room?: Total ?Help needed climbing 3-5 steps with a railing? : Total ?6 Click Score: 11 ? ?  ?End of Session Equipment Utilized During Treatment: Gait belt ?Activity Tolerance: Patient limited by fatigue;Patient limited by lethargy ?Patient left: in chair;with call bell/phone within reach;with chair alarm set ?Nurse Communication: Mobility status ?PT Visit Diagnosis: Other abnormalities of gait and mobility (R26.89);Difficulty in walking, not elsewhere classified (R26.2);Muscle weakness (generalized) (M62.81);Pain ?Pain - Right/Left: Left ?Pain - part of body: Hip ?  ? ? ?Time: 9758-8325 ?PT Time Calculation (min) (ACUTE ONLY): 18 min ? ?Charges:  $Therapeutic Activity: 8-22 mins ?Ramond Dial ?06/30/2021, 1:18 PM ? ?Mee Hives, PT PhD ?Acute Rehab Dept. Number: [facility] [phone] and [place] [phone] ? ? ?

## 2021-06-30 NOTE — Progress Notes (Signed)
[place] at [facility] ? ? ?PATIENT NAME: Jenna Young   ? ?MR#:  [id number] ? ?DATE OF BIRTH:  [birth year] ? ?SUBJECTIVE:  ?POD #2 appears tired and sleepy ?family at bedside. ?Earlier did work with PT and was to the chair.had presyncopal episode while trying to go to bed. BP ok. Did not eat much this am ? ?No complaints ?VITALS:  ?Blood pressure (!) 140/58, pulse 63, temperature 98.3 ?F (36.8 ?C), temperature source Oral, resp. rate 19, height 5' (1.524 m), weight 62.6 kg, SpO2 97 %. ? ?PHYSICAL EXAMINATION:  ? ?GENERAL:  88 y.o.-year-old patient lying in the bed with no acute distress. Weak, fatigued today ?LUNGS: Normal breath sounds bilaterally ?CARDIOVASCULAR: S1, S2 normal. No murmurs ?ABDOMEN: Soft, nontender, nondistended. Bowel sounds present.  ?EXTREMITIES: decreased ROM ?NEUROLOGIC: nonfocal  patient is sleepy  ?SKIN: No obvious rash, lesion, or ulcer.  ? ?LABORATORY PANEL:  ?CBC ?Recent Labs  ?Lab 06/28/21 ?6378 06/29/21 ?1009  ?WBC 9.2  --   ?HGB 10.2* 9.3*  ?HCT 31.3*  --   ?PLT 155  --   ? ? ? ?Chemistries  ?Recent Labs  ?Lab 06/27/21 ?1655 06/28/21 ?0552  ?NA 133* 130*  ?K 3.8 4.1  ?CL 101 105  ?CO2 22 22  ?GLUCOSE 117* 115*  ?BUN 12 11  ?CREATININE 0.92 0.81  ?CALCIUM 9.3 8.2*  ?AST 25  --   ?ALT 17  --   ?ALKPHOS 66  --   ?BILITOT 0.7  --   ? ? ?Cardiac Enzymes ?No results for input(s): TROPONINI in the last 168 hours. ?RADIOLOGY:  ?DG Pelvis Portable ? ?Result Date: 06/28/2021 ?CLINICAL DATA:  Postoperative EXAM: PORTABLE PELVIS 1-2 VIEWS COMPARISON:  06/27/2021 FINDINGS: Interval left total hip arthroplasty. No perihardware lucency or fracture. Prosthetic components are in near anatomic alignment. Air within the left hip soft tissues is not unexpected postoperatively. Superficial skin staples. Redemonstrated screw fixation of the right femoral neck. IMPRESSION: Expected postoperative appearance, status post left total hip arthroplasty. Electronically Signed   By:  Merilyn Baba M.D.   On: 06/28/2021 18:53   ? ?Assessment and Plan ? ?88 y/o F w/ PMH of HTN, HLD, CAD, GERD, breast cancer (in remission for at least 5 years) who presents a fall at [facility]. Pt stated she tripped over the sidewalk and fell on left hip. ? ?AMS/confusion/Insomnia ?--hold sleep meds ?--tylenol prn for pain ?--IVF bolus x1 today ? ?Left hip fracture: secondary to pt tripping on sidewalk at [facility]. --prn Norco, IV morphine prn for pain.  ?--NPO after midnight as per ortho surg.  ?--Orthopedic consult with Dr. Sharlet Salina. ?-- DVT orders per orthopedic--lovenox qd x 14 days ?-- PT OT recommends Rehab ?-- TOC for discharge planning ?--hgb 9.3 ?  ?HTN:  ?--continue on home dose of carvedilol, imdur, losartan. IV hydralazine prn ?  ?HLD: cont rosuvastatin   ? ?Hx of CAD: continue on carvedilol, imdur & losartan.  ?-- EKG shows sinus rhythm with left axis deviation, LBBB ?-- no chest pain or shortness of breath. ?-- Follows Dr. Rockey Situ as outpatient ?-- resume plavix ? ?GERD: continue on PPI  ?  ?Hyponatremia: mild ?  ? ? ? ?Procedures: left hemiarthroplasty ?Family communication : daughter/niece at bedside ?Consults : orthopedic ?CODE STATUS: full ?DVT Prophylaxis : lovenox ?Level of care: Med-Surg ?Status is: inpatient ?Hip fracture pending surgery ? ?  ? ?TOTAL TIME TAKING CARE OF THIS PATIENT: 30 minutes.  ?>50% time spent on counselling and coordination of care ? ?  Note: This dictation was prepared with Dragon dictation along with smaller phrase technology. Any transcriptional errors that result from this process are unintentional. ? ?Fritzi Mandes M.D  ? ? ?[facility]  ? ?CC: ?Primary care physician; Tower, [name], MD  ?

## 2021-06-30 NOTE — Progress Notes (Signed)
?   06/30/21 1300  ?Clinical Encounter Type  ?Visited With Family  ?Visit Type Code  ?Spiritual Encounters  ?Spiritual Needs Prayer  ? ?Chaplain responded to a Rapid. Provided support to family waiting outside room. ?

## 2021-07-01 DIAGNOSIS — S72002A Fracture of unspecified part of neck of left femur, initial encounter for closed fracture: Secondary | ICD-10-CM | POA: Diagnosis not present

## 2021-07-01 DIAGNOSIS — R410 Disorientation, unspecified: Secondary | ICD-10-CM | POA: Diagnosis not present

## 2021-07-01 NOTE — Progress Notes (Signed)
[place] at [facility] ? ? ?PATIENT NAME: Jenna Young   ? ?MR#:  [id number] ? ?DATE OF BIRTH:  [birth year] ? ?SUBJECTIVE:  ?POD # 3--slept well and feels better ? ?VITALS:  ?Blood pressure (!) 120/58, pulse 90, temperature 98.6 ?F (37 ?C), resp. rate 16, height 5' (1.524 m), weight 62.6 kg, SpO2 97 %. ? ?PHYSICAL EXAMINATION:  ? ?GENERAL:  88 y.o.-year-old patient lying in the bed with no acute distress. Weak, fatigued today ?LUNGS: Normal breath sounds bilaterally ?CARDIOVASCULAR: S1, S2 normal. No murmurs ?ABDOMEN: Soft, nontender, nondistended. Bowel sounds present.  ?EXTREMITIES: decreased ROM ?NEUROLOGIC: nonfocal  patient is alert and awake ?SKIN: No obvious rash, lesion, or ulcer.  ? ?LABORATORY PANEL:  ?CBC ?Recent Labs  ?Lab 06/28/21 ?2542 06/29/21 ?1009  ?WBC 9.2  --   ?HGB 10.2* 9.3*  ?HCT 31.3*  --   ?PLT 155  --   ? ? ? ?Chemistries  ?Recent Labs  ?Lab 06/27/21 ?1655 06/28/21 ?0552  ?NA 133* 130*  ?K 3.8 4.1  ?CL 101 105  ?CO2 22 22  ?GLUCOSE 117* 115*  ?BUN 12 11  ?CREATININE 0.92 0.81  ?CALCIUM 9.3 8.2*  ?AST 25  --   ?ALT 17  --   ?ALKPHOS 66  --   ?BILITOT 0.7  --   ? ? ?Cardiac Enzymes ?No results for input(s): TROPONINI in the last 168 hours. ?RADIOLOGY:  ?No results found. ? ?Assessment and Plan ? ?88 y/o F w/ PMH of HTN, HLD, CAD, GERD, breast cancer (in remission for at least 5 years) who [facility]. Pt stated she tripped over the sidewalk and fell on left hip. ? ?AMS/confusion/Insomnia ?--hold sleep meds ?--tylenol prn for pain ?--IVF bolus x1 today ? ?Left hip fracture: secondary to pt tripping on [facility] at [facility]. --prn Norco, IV morphine prn for pain.  ?--Orthopedic consult with Dr. Sharlet Salina. ?-- DVT orders per orthopedic--lovenox qd x 14 days ?-- PT OT recommends Rehab ?-- TOC for discharge planning to [facility] ?--hgb 9.3 ?  ?HTN:  ?--continue on home dose of carvedilol, imdur, losartan. IV hydralazine prn ?  ?HLD: cont rosuvastatin   ? ?Hx of  CAD: continue on carvedilol, imdur & losartan.  ?-- EKG shows sinus rhythm with left axis deviation, LBBB ?-- no chest pain or shortness of breath. ?-- Follows Dr. Rockey Situ as outpatient ?-- resumd e plavix ? ?GERD: continue on PPI  ?  ?Hyponatremia: mild ?  ? ? ? ?Procedures: left hemiarthroplasty ?Family communication : daughter/niece at bedside ?Consults : orthopedic ?CODE STATUS: full ?DVT Prophylaxis : lovenox ?Level of care: Med-Surg ?Status is: inpatient ?Hip fracture pending surgery ? ?[facility]--- pending insurance authorization ? ?TOTAL TIME TAKING CARE OF THIS PATIENT: 30 minutes.  ?>50% time spent on counselling and coordination of care ? ?Note: This dictation was prepared with Dragon dictation along with smaller phrase technology. Any transcriptional errors that result from this process are unintentional. ? ?Fritzi Mandes M.D  ? ? ?[facility]  ? ?CC: ?Primary care physician; Tower, [name], MD  ?

## 2021-07-01 NOTE — Progress Notes (Signed)
Physical Therapy Treatment ?Patient Details ?Name: Jenna Young [facility] ?MRN: [id number] ?DOB: June 17, 1931 ?Today's Date: 07/01/2021 ? ? ?History of Present Illness 88 y/o Female w/ PMH of HTN, HLD, CAD, GERD, breast cancer (in remission for at least 5 years) who presents a fall at [facility], admitted 4/19. Pt stated she tripped over the sidewalk and fell on left hip. Now s/p hemiarthroplasty. ? ?  ?PT Comments  ? ? Pt ready for session.  Feeling good today.  Participated in exercises as described below.  She is assisted to EOB with mod a x 1 and generally steady in sitting today.  Pt with no c/o.  BP checked in sitting 141/43, 113/46 and 123/36 at varied points over 5 minutes.  She denies any dizziness or orhtostatic symptoms.  She is able to stand briefly with mod a x 1 but unable to take any steps and sits due to fatigue.  +2 is called and RN in to assist with transfer to recliner at bedside.  She has much difficulty moving LLE and needs assist to do so by Probation officer.  She does fatigue out before transferring fully to chair but is guided safely to a sitting position.  Remained up with breakfast tray.  ?  ?Recommendations for follow up therapy are one component of a multi-disciplinary discharge planning process, led by the attending physician.  Recommendations may be updated based on patient status, additional functional criteria and insurance authorization. ? ?Follow Up Recommendations ? Skilled nursing-short term rehab (<3 hours/day) ?  ?  ?Assistance Recommended at Discharge Frequent or constant Supervision/Assistance  ?Patient can return home with the following A lot of help with bathing/dressing/bathroom;Assist for transportation;Assistance with cooking/housework;Help with stairs or ramp for entrance;Two people to help with walking and/or transfers ?  ?Equipment Recommendations ? Rolling walker (2 wheels);BSC/3in1  ?  ?Recommendations for Other Services   ? ? ?  ?Precautions / Restrictions Precautions ?Precautions:  Fall;Posterior Hip ?Precaution Booklet Issued: Yes (comment) ?Restrictions ?Weight Bearing Restrictions: Yes ?LLE Weight Bearing: Weight bearing as tolerated ?Other Position/Activity Restrictions: posterior precautions  ?  ? ?Mobility ? Bed Mobility ?Overal bed mobility: Needs Assistance ?Bed Mobility: Supine to Sit ?  ?  ?Supine to sit: Mod assist ?  ?  ?  ?  ? ?Transfers ?Overall transfer level: Needs assistance ?Equipment used: Rolling walker (2 wheels) ?Transfers: Sit to/from Stand, Bed to chair/wheelchair/BSC ?Sit to Stand: Mod assist ?  ?Step pivot transfers: Mod assist, +2 physical assistance, +2 safety/equipment ?  ?  ?  ?General transfer comment: able to stand with +1 assist but requires +2 to transfer to chair for safety ?  ? ?Ambulation/Gait ?  ?  ?  ?  ?Gait velocity: decreased ?  ?  ?General Gait Details: steps were to get to chair- no true gait and needed physical assist to move LLE ? ? ?Stairs ?  ?  ?  ?  ?  ? ? ?Wheelchair Mobility ?  ? ?Modified Rankin (Stroke Patients Only) ?  ? ? ?  ?Balance Overall balance assessment: Needs assistance ?Sitting-balance support: Feet supported ?Sitting balance-Leahy Scale: Fair ?Sitting balance - Comments: stable in sitting today ?  ?Standing balance support: Reliant on assistive device for balance, Bilateral upper extremity supported ?Standing balance-Leahy Scale: Poor ?  ?  ?  ?  ?  ?  ?  ?  ?  ?  ?  ?  ?  ? ?  ?Cognition Arousal/Alertness: Awake/alert ?Behavior During Therapy: [facility] for tasks assessed/performed ?Overall Cognitive Status:  Within Functional Limits for tasks assessed ?  ?  ?  ?  ?  ?  ?  ?  ?  ?  ?  ?  ?  ?  ?  ?  ?General Comments: no confusion noted today ?  ?  ? ?  ?Exercises Other Exercises ?Other Exercises: supine and seated A/AAROM ? ?  ?General Comments   ?  ?  ? ?Pertinent Vitals/Pain Pain Assessment ?Pain Assessment: Faces ?Faces Pain Scale: Hurts little more ?Pain Location: pt reports her L hip being sore with mobility ?Pain Descriptors /  Indicators: Guarding, Grimacing ?Pain Intervention(s): Limited activity within patient's tolerance, Monitored during session, Repositioned, Patient requesting pain meds-RN notified  ? ? ?Home Living   ?  ?  ?  ?  ?  ?  ?  ?  ?  ?   ?  ?Prior Function    ?  ?  ?   ? ?PT Goals (current goals can now be found in the care plan section) Progress towards PT goals: Progressing toward goals ? ?  ?Frequency ? ? ? 7X/week ? ? ? ?  ?PT Plan Current plan remains appropriate  ? ? ?Co-evaluation   ?  ?  ?  ?  ? ?  ?AM-PAC PT "6 Clicks" Mobility   ?Outcome Measure ? Help needed turning from your back to your side while in a flat bed without using bedrails?: A Little ?Help needed moving from lying on your back to sitting on the side of a flat bed without using bedrails?: A Lot ?Help needed moving to and from a bed to a chair (including a wheelchair)?: A Lot ?Help needed standing up from a chair using your arms (e.g., wheelchair or bedside chair)?: A Lot ?Help needed to walk in hospital room?: Total ?Help needed climbing 3-5 steps with a railing? : Total ?6 Click Score: 11 ? ?  ?End of Session Equipment Utilized During Treatment: Gait belt ?Activity Tolerance: Patient tolerated treatment well ?Patient left: in chair;with call bell/phone within reach;with chair alarm set;with nursing/sitter in room ?Nurse Communication: Mobility status ?PT Visit Diagnosis: Other abnormalities of gait and mobility (R26.89);Difficulty in walking, not elsewhere classified (R26.2);Muscle weakness (generalized) (M62.81);Pain ?Pain - Right/Left: Left ?Pain - part of body: Hip ?  ? ? ?Time: 0383-3383 ?PT Time Calculation (min) (ACUTE ONLY): 30 min ? ?Charges:  $Therapeutic Exercise: 8-22 mins ?$Therapeutic Activity: 8-22 mins          ?          ?Chesley Noon, PTA ?07/01/21, 11:19 AM ? ?

## 2021-07-01 NOTE — TOC Progression Note (Signed)
Transition of Care (TOC) - Progression Note  ? ? ?Patient Details  ?Name: Jenna Young [facility] ?MRN: [id number] ?Date of Birth: April 01, 1931 ? ?Transition of Care (TOC) CM/SW Contact  ?Hasten Sweitzer E Abuk Selleck, LCSW ?Phone Number: ?07/01/2021, 10:27 AM ? ?Clinical Narrative:   Plan for DC to [facility] tomorrow. Confirmed with Tiffany at [facility] on 4/21. ? ? ? ?Expected Discharge Plan: [place] ?Barriers to Discharge: Continued Medical Work up ? ?Expected Discharge Plan and Services ?Expected Discharge Plan: [place] ?  ?  ?  ?Living arrangements for the past 2 months: [place] ?                ?  ?  ?  ?  ?  ?  ?  ?  ?  ?  ? ? ?Social Determinants of Health (SDOH) Interventions ?  ? ?Readmission Risk Interventions ? ?  06/29/2021  ? 12:36 PM  ?Readmission Risk Prevention Plan  ?Post Dischage Appt Complete  ?Medication Screening Complete  ?Transportation Screening Complete  ? ? ?

## 2021-07-01 NOTE — Plan of Care (Signed)

## 2021-07-02 DIAGNOSIS — S728X2D Other fracture of left femur, subsequent encounter for closed fracture with routine healing: Secondary | ICD-10-CM | POA: Diagnosis not present

## 2021-07-02 DIAGNOSIS — E559 Vitamin D deficiency, unspecified: Secondary | ICD-10-CM | POA: Diagnosis not present

## 2021-07-02 DIAGNOSIS — Z853 Personal history of malignant neoplasm of breast: Secondary | ICD-10-CM | POA: Diagnosis not present

## 2021-07-02 DIAGNOSIS — I10 Essential (primary) hypertension: Secondary | ICD-10-CM | POA: Diagnosis not present

## 2021-07-02 DIAGNOSIS — Z7401 Bed confinement status: Secondary | ICD-10-CM | POA: Diagnosis not present

## 2021-07-02 DIAGNOSIS — M6281 Muscle weakness (generalized): Secondary | ICD-10-CM | POA: Diagnosis not present

## 2021-07-02 DIAGNOSIS — R29898 Other symptoms and signs involving the musculoskeletal system: Secondary | ICD-10-CM | POA: Diagnosis not present

## 2021-07-02 DIAGNOSIS — E538 Deficiency of other specified B group vitamins: Secondary | ICD-10-CM | POA: Diagnosis not present

## 2021-07-02 DIAGNOSIS — K5900 Constipation, unspecified: Secondary | ICD-10-CM | POA: Diagnosis not present

## 2021-07-02 DIAGNOSIS — M8580 Other specified disorders of bone density and structure, unspecified site: Secondary | ICD-10-CM | POA: Diagnosis not present

## 2021-07-02 DIAGNOSIS — W19XXXD Unspecified fall, subsequent encounter: Secondary | ICD-10-CM | POA: Diagnosis not present

## 2021-07-02 DIAGNOSIS — S72002A Fracture of unspecified part of neck of left femur, initial encounter for closed fracture: Secondary | ICD-10-CM | POA: Diagnosis not present

## 2021-07-02 DIAGNOSIS — Z7901 Long term (current) use of anticoagulants: Secondary | ICD-10-CM | POA: Diagnosis not present

## 2021-07-02 DIAGNOSIS — I2510 Atherosclerotic heart disease of native coronary artery without angina pectoris: Secondary | ICD-10-CM | POA: Diagnosis not present

## 2021-07-02 DIAGNOSIS — G4700 Insomnia, unspecified: Secondary | ICD-10-CM | POA: Diagnosis not present

## 2021-07-02 DIAGNOSIS — E7849 Other hyperlipidemia: Secondary | ICD-10-CM | POA: Diagnosis not present

## 2021-07-02 DIAGNOSIS — R5381 Other malaise: Secondary | ICD-10-CM | POA: Diagnosis not present

## 2021-07-02 DIAGNOSIS — D649 Anemia, unspecified: Secondary | ICD-10-CM | POA: Diagnosis not present

## 2021-07-02 DIAGNOSIS — R4182 Altered mental status, unspecified: Secondary | ICD-10-CM | POA: Diagnosis not present

## 2021-07-02 DIAGNOSIS — F411 Generalized anxiety disorder: Secondary | ICD-10-CM | POA: Diagnosis not present

## 2021-07-02 DIAGNOSIS — S72042A Displaced fracture of base of neck of left femur, initial encounter for closed fracture: Secondary | ICD-10-CM | POA: Diagnosis not present

## 2021-07-02 DIAGNOSIS — M810 Age-related osteoporosis without current pathological fracture: Secondary | ICD-10-CM | POA: Diagnosis not present

## 2021-07-02 DIAGNOSIS — D508 Other iron deficiency anemias: Secondary | ICD-10-CM | POA: Diagnosis not present

## 2021-07-02 DIAGNOSIS — J302 Other seasonal allergic rhinitis: Secondary | ICD-10-CM | POA: Diagnosis not present

## 2021-07-02 DIAGNOSIS — Z96642 Presence of left artificial hip joint: Secondary | ICD-10-CM | POA: Diagnosis not present

## 2021-07-02 DIAGNOSIS — K219 Gastro-esophageal reflux disease without esophagitis: Secondary | ICD-10-CM | POA: Diagnosis not present

## 2021-07-02 DIAGNOSIS — S72002D Fracture of unspecified part of neck of left femur, subsequent encounter for closed fracture with routine healing: Secondary | ICD-10-CM | POA: Diagnosis not present

## 2021-07-02 DIAGNOSIS — E871 Hypo-osmolality and hyponatremia: Secondary | ICD-10-CM | POA: Diagnosis not present

## 2021-07-02 DIAGNOSIS — E7800 Pure hypercholesterolemia, unspecified: Secondary | ICD-10-CM | POA: Diagnosis not present

## 2021-07-02 LAB — SURGICAL PATHOLOGY

## 2021-07-02 MED ORDER — ENOXAPARIN SODIUM 40 MG/0.4ML IJ SOSY: 40.0000 mg | PREFILLED_SYRINGE | INTRAMUSCULAR | 0 refills | Status: DC

## 2021-07-02 MED ORDER — DOCUSATE SODIUM 100 MG PO CAPS: 200.0000 mg | ORAL_CAPSULE | Freq: Two times a day (BID) | ORAL | 0 refills | Status: AC

## 2021-07-02 MED ORDER — ADULT MULTIVITAMIN W/MINERALS CH: 1.0000 | ORAL_TABLET | Freq: Every day | ORAL | 1 refills | Status: AC

## 2021-07-02 MED ORDER — POLYETHYLENE GLYCOL 3350 17 G PO PACK: 17.0000 g | PACK | Freq: Every day | ORAL | 0 refills | Status: DC | PRN

## 2021-07-02 NOTE — Plan of Care (Signed)
  Problem: Activity: Goal: Risk for activity intolerance will decrease Outcome: Progressing   Problem: Pain Managment: Goal: General experience of comfort will improve Outcome: Progressing   

## 2021-07-02 NOTE — TOC Progression Note (Signed)
Transition of Care (TOC) - Progression Note  ? ? ?Patient Details  ?Name: Jenna Young [facility] ?MRN: [id number] ?Date of Birth: 1932-01-09 ? ?Transition of Care (TOC) CM/SW Contact  ?Conception Oms, RN ?Phone Number: ?07/02/2021, 9:19 AM ? ?Clinical Narrative:    ? ?Patient going to [facility] [address] ?To be transported by EMS  ?Attempted to reach son Shanon Brow and Daughter Lelon Frohlich, left a general vm for a call back ?EMS called and she is on the transport list ? ? ?Expected Discharge Plan: [place] ?Barriers to Discharge: Continued Medical Work up ? ?Expected Discharge Plan and Services ?Expected Discharge Plan: [place] ?  ?  ?  ?Living arrangements for the past 2 months: [place] ?Expected Discharge Date: 07/02/21               ?  ?  ?  ?  ?  ?  ?  ?  ?  ?  ? ? ?Social Determinants of Health (SDOH) Interventions ?  ? ?Readmission Risk Interventions ? ?  06/29/2021  ? 12:36 PM  ?Readmission Risk Prevention Plan  ?Post Dischage Appt Complete  ?Medication Screening Complete  ?Transportation Screening Complete  ? ? ?

## 2021-07-02 NOTE — Care Management Important Message (Signed)
Important Message ? ?Patient Details  ?Name: Jenna Young [facility] ?MRN: [id number] ?Date of Birth: 25-Sep-1931 ? ? ?Medicare Important Message Given:  Yes ? ? ? ? ?[name] ?07/02/2021, 10:36 AM ?

## 2021-07-02 NOTE — Discharge Summary (Addendum)
?Physician Discharge Summary ?  ?Patient: Jenna Young MRN: [id number] DOB: 12/03/1931  ?Admit date:     06/27/2021  ?Discharge date: 07/02/21  ?Discharge Physician: Fritzi Mandes  ? ?PCP: Abner Greenspan, MD  ? ?Recommendations at discharge:  ? ? F/u Dr Sharlet Salina in 14 days post op hip surgery ?follow-up your PCP in 1 to 2 week ? ?Discharge Diagnoses: ?left hip fracture status post ? ?Hospital Course: ? ?88 y/o F w/ PMH of HTN, HLD, CAD, GERD, breast cancer (in remission for at least 5 years) who [facility] Pt stated she tripped over the sidewalk and fell on left hip. ?  ?AMS/confusion/Insomnia ?--hold sleep meds ?--tylenol prn for pain ?--IVF bolus x1  ?--mentation improved and at baseline ?  ?Left hip fracture: secondary to [facility]. --prn Norco, IV morphine prn for pain.  ?--Orthopedic consult with Dr. Sharlet Salina. ?-- DVT orders per orthopedic--lovenox qd x 14 days ?-- PT OT recommends Rehab ?-- TOC for discharge planning to [place] ?--hgb 9.3 ?  ?HTN:  ?--continue on home dose of carvedilol, imdur, losartan. IV hydralazine prn ?  ?HLD: cont rosuvastatin   ?  ?Hx of CAD: continue on carvedilol, imdur & losartan.  ?-- EKG shows sinus rhythm with left axis deviation, LBBB ?-- no chest pain or shortness of breath. ?-- Follows Dr. Rockey Situ as outpatient ?-- resumed plavix ?  ?GERD: continue on PPI  ?  ?Hyponatremia: mild ?  ?patient will discharged to [facility] for rehab. ?  ?Addendum: pt had a vasovagal episode after having a large BM this morning for about 30 secs. She has had episodes like this in the past per her dter. BP stable... 130/60 ? ?  ?  ?Procedures: left hemiarthroplasty ?Family communication : daughter aware of plans ?Consults : orthopedic ?CODE STATUS: full ?DVT Prophylaxis : lovenox ? ? ?  ? ? ?Disposition: Rehabilitation facility ?Diet recommendation:  ?Discharge Diet Orders (From admission, onward)  ? ?  Start     Ordered  ? 07/02/21 0000  Diet - low  sodium heart healthy       ? 07/02/21 0837  ? ?  ?  ? ?  ? ?Cardiac diet ?DISCHARGE MEDICATION: ?Allergies as of 07/02/2021   ? ?   Reactions  ? Buspirone Hcl   ? REACTION: HEART PALPITATIONS  ? Codeine   ? REACTION: nausea and vomiting  ? Hydrochlorothiazide   ? REACTION: syncope-possibly from dehydration  ? Iron   ? Oral iron -per pt made her "pass out"   ? Lansoprazole   ? REACTION: abd pain  ? Minocycline Hcl   ? Penicillins   ? REACTION: mouth numbness ?Has patient had a PCN reaction causing immediate rash, facial/tongue/throat swelling, SOB or lightheadedness with hypotension: No ?Has patient had a PCN reaction causing severe rash involving mucus membranes or skin necrosis: No ?Has patient had a PCN reaction that required hospitalization: No ?Has patient had a PCN reaction occurring within the last 10 years: No ?If all of the above answers are "NO", then may proceed with Cephalosporin use.  ? Phenergan [promethazine Hcl] Other (See Comments)  ? Body shakes, hallucinations  ? Ramipril   ? Cough  ? Ranexa [ranolazine]   ? Constipation   ? Sulfa Antibiotics Nausea Only  ? Rosuvastatin   ? Patient reports muscle pain, unable to take. Stopped around 07/22.  ? ?  ? ?  ?Medication List  ?  ? ?STOP taking  these medications   ? ?aspirin 81 MG EC tablet ?  ? ?  ? ?TAKE these medications   ? ?carvedilol 3.125 MG tablet ?Commonly known as: COREG ?TAKE 1 TABLET(3.125 MG) BY MOUTH TWICE DAILY ?  ?clopidogrel 75 MG tablet ?Commonly known as: PLAVIX ?TAKE 1 TABLET(75 MG) BY MOUTH DAILY WITH BREAKFAST ?  ?cyanocobalamin 1000 MCG/ML injection ?Commonly known as: (VITAMIN B-12) ?Inject 1,000 mcg into the muscle every 30 (thirty) days. ?  ?docusate sodium 100 MG capsule ?Commonly known as: COLACE ?Take 2 capsules (200 mg total) by mouth 2 (two) times daily. ?  ?enoxaparin 40 MG/0.4ML injection ?Commonly known as: LOVENOX ?Inject 0.4 mLs (40 mg total) into the skin daily for 12 days. ?  ?eucerin cream ?Apply 1 application.  topically as needed for dry skin. ?  ?isosorbide mononitrate 30 MG 24 hr tablet ?Commonly known as: IMDUR ?TAKE 1/2 TABLET(15 MG) BY MOUTH DAILY ?  ?losartan 100 MG tablet ?Commonly known as: COZAAR ?Take 1 tablet (100 mg total) by mouth daily. ?  ?multivitamin with minerals Tabs tablet ?Take 1 tablet by mouth daily. ?Start taking on: July 03, 2021 ?  ?nitroGLYCERIN 0.4 MG SL tablet ?Commonly known as: NITROSTAT ?PLACE 1 TABLET UNDER TONGUE EVERY 5 MIN AS NEEDED FOR CHEST PAIN IF NO RELIEF IN15 MIN CALL 911 (MAX 3 TABS) ?  ?omeprazole 20 MG capsule ?Commonly known as: PRILOSEC ?TAKE 1 CAPSULE(20 MG) BY MOUTH DAILY AS NEEDED ?  ?oxyCODONE 5 MG immediate release tablet ?Commonly known as: Roxicodone ?Take 1 tablet (5 mg total) by mouth every 4 (four) hours as needed. ?  ?polyethylene glycol 17 g packet ?Commonly known as: MIRALAX / GLYCOLAX ?Take 17 g by mouth daily as needed for mild constipation. ?  ?rosuvastatin 10 MG tablet ?Commonly known as: CRESTOR ?TAKE 1 TABLET(10 MG) BY MOUTH DAILY ?  ?Vitamin D 50 MCG (2000 UT) Caps ?Take 1 capsule by mouth daily. ?  ? ?  ? ?  ?  ? ? ?  ?Discharge Care Instructions  ?(From admission, onward)  ?  ? ? ?  ? ?  Start     Ordered  ? 07/02/21 0000  Discharge wound care:       ?Comments: Per orthopedic instructions  ? 07/02/21 0837  ? ?  ?  ? ?  ? ? Follow-up Information   ? ? Tower, [name], MD. Schedule an appointment as soon as possible for a visit in 1 week(s).   ?Specialties: Family Medicine, Radiology ?Why: f/u 1-2 weeks ?Contact information: ?[address] ?[place] [address] ?[phone] ? ? ?  ?  ? ? Minna Merritts, MD .   ?Specialty: Cardiology ?Contact information: ?[address] ?[place] [address] ?[phone] ? ? ?  ?  ? ? Renee Harder, MD. Schedule an appointment as soon as possible for a visit.   ?Specialty: Orthopedic Surgery ?Why: s/p hip fracture, staple removal ?Contact information: ?[address] ?[place]  [address] ?[phone] ? ? ?  ?  ? ?  ?  ? ?  ? ?Discharge Exam: ?Danley Danker Weights  ? 06/27/21 1515  ?Weight: 62.6 kg  ? ? ? ?Condition at discharge: fair ? ?The results of significant diagnostics from this hospitalization (including imaging, microbiology, ancillary and laboratory) are listed below for reference.  ? ?Imaging Studies: ?CT HEAD WO CONTRAST (5MM) ? ?Result Date: 06/27/2021 ?CLINICAL DATA:  Trauma, fall EXAM: CT HEAD WITHOUT CONTRAST TECHNIQUE: Contiguous axial images were  obtained from the base of the skull through the vertex without intravenous contrast. RADIATION DOSE REDUCTION: This exam was performed according to the departmental dose-optimization program which includes automated exposure control, adjustment of the mA and/or kV according to patient size and/or use of iterative reconstruction technique. COMPARISON:  02/28/2017 FINDINGS: Brain: There is prominence of third and both lateral ventricles. Cortical sulci are prominent. No acute intracranial findings are seen. There is decreased density in the periventricular and subcortical white matter. Vascular: Scattered arterial calcifications are seen. Skull: Unremarkable. Sinuses/Orbits: There is mucosal thickening in the ethmoid and sphenoid sinuses. Other: No significant interval changes are noted. IMPRESSION: No acute intracranial findings are seen in noncontrast CT brain. Atrophy. Small-vessel disease. Chronic sinusitis. Electronically Signed   By: Elmer Picker M.D.   On: 06/27/2021 17:03  ? ?CT Cervical Spine Wo Contrast ? ?Result Date: 06/27/2021 ?CLINICAL DATA:  Neck trauma (Age >= 65y) EXAM: CT CERVICAL SPINE WITHOUT CONTRAST TECHNIQUE: Multidetector CT imaging of the cervical spine was performed without intravenous contrast. Multiplanar CT image reconstructions were also generated. RADIATION DOSE REDUCTION: This exam was performed according to the departmental dose-optimization program which includes automated exposure control, adjustment of  the mA and/or kV according to patient size and/or use of iterative reconstruction technique. COMPARISON:  None. FINDINGS: Alignment: No substantial sagittal subluxation. Skull base and vertebrae: No evidence of

## 2021-07-04 DIAGNOSIS — I2510 Atherosclerotic heart disease of native coronary artery without angina pectoris: Secondary | ICD-10-CM | POA: Diagnosis not present

## 2021-07-04 DIAGNOSIS — R4182 Altered mental status, unspecified: Secondary | ICD-10-CM | POA: Diagnosis not present

## 2021-07-04 DIAGNOSIS — K219 Gastro-esophageal reflux disease without esophagitis: Secondary | ICD-10-CM | POA: Diagnosis not present

## 2021-07-04 DIAGNOSIS — S728X2D Other fracture of left femur, subsequent encounter for closed fracture with routine healing: Secondary | ICD-10-CM | POA: Diagnosis not present

## 2021-07-04 DIAGNOSIS — J302 Other seasonal allergic rhinitis: Secondary | ICD-10-CM | POA: Diagnosis not present

## 2021-07-04 DIAGNOSIS — E871 Hypo-osmolality and hyponatremia: Secondary | ICD-10-CM | POA: Diagnosis not present

## 2021-07-04 DIAGNOSIS — I10 Essential (primary) hypertension: Secondary | ICD-10-CM | POA: Diagnosis not present

## 2021-07-04 DIAGNOSIS — M810 Age-related osteoporosis without current pathological fracture: Secondary | ICD-10-CM | POA: Diagnosis not present

## 2021-07-04 DIAGNOSIS — D508 Other iron deficiency anemias: Secondary | ICD-10-CM | POA: Diagnosis not present

## 2021-07-04 DIAGNOSIS — E7849 Other hyperlipidemia: Secondary | ICD-10-CM | POA: Diagnosis not present

## 2021-07-04 DIAGNOSIS — M6281 Muscle weakness (generalized): Secondary | ICD-10-CM | POA: Diagnosis not present

## 2021-07-06 DIAGNOSIS — S728X2D Other fracture of left femur, subsequent encounter for closed fracture with routine healing: Secondary | ICD-10-CM | POA: Diagnosis not present

## 2021-07-06 DIAGNOSIS — K219 Gastro-esophageal reflux disease without esophagitis: Secondary | ICD-10-CM | POA: Diagnosis not present

## 2021-07-06 DIAGNOSIS — I10 Essential (primary) hypertension: Secondary | ICD-10-CM | POA: Diagnosis not present

## 2021-07-06 DIAGNOSIS — E871 Hypo-osmolality and hyponatremia: Secondary | ICD-10-CM | POA: Diagnosis not present

## 2021-07-06 DIAGNOSIS — I2510 Atherosclerotic heart disease of native coronary artery without angina pectoris: Secondary | ICD-10-CM | POA: Diagnosis not present

## 2021-07-06 DIAGNOSIS — R4182 Altered mental status, unspecified: Secondary | ICD-10-CM | POA: Diagnosis not present

## 2021-07-09 DIAGNOSIS — E871 Hypo-osmolality and hyponatremia: Secondary | ICD-10-CM | POA: Diagnosis not present

## 2021-07-09 DIAGNOSIS — I10 Essential (primary) hypertension: Secondary | ICD-10-CM | POA: Diagnosis not present

## 2021-07-09 DIAGNOSIS — I2510 Atherosclerotic heart disease of native coronary artery without angina pectoris: Secondary | ICD-10-CM | POA: Diagnosis not present

## 2021-07-09 DIAGNOSIS — R4182 Altered mental status, unspecified: Secondary | ICD-10-CM | POA: Diagnosis not present

## 2021-07-09 DIAGNOSIS — S728X2D Other fracture of left femur, subsequent encounter for closed fracture with routine healing: Secondary | ICD-10-CM | POA: Diagnosis not present

## 2021-07-09 DIAGNOSIS — K219 Gastro-esophageal reflux disease without esophagitis: Secondary | ICD-10-CM | POA: Diagnosis not present

## 2021-07-12 ENCOUNTER — Telehealth: Payer: Self-pay

## 2021-07-12 DIAGNOSIS — S72042A Displaced fracture of base of neck of left femur, initial encounter for closed fracture: Secondary | ICD-10-CM | POA: Diagnosis not present

## 2021-07-12 NOTE — Telephone Encounter (Signed)
Patient has been at [facility] for rehab after hip surgery. Family is concerned that they have put her on no salt diet and have added a sodium pill. They have also removed one of her blood pressure medications. Family wants to make sure that you are aware and if you feel like that needs to be changed. She has had her BP drop very low a few times recently.   ?

## 2021-07-12 NOTE — Telephone Encounter (Signed)
I don't think I have received anything from them/ it may be ortho who oversees her  ?It makes I don't know whether her bp is low all the time or just drops or which med they are holding.   They may need to talk to the supervising provider there now.    If you can please let me know what med they are holding, thanks.  ?Thanks for letting me know  ?

## 2021-07-13 DIAGNOSIS — M6281 Muscle weakness (generalized): Secondary | ICD-10-CM | POA: Diagnosis not present

## 2021-07-13 DIAGNOSIS — S728X2D Other fracture of left femur, subsequent encounter for closed fracture with routine healing: Secondary | ICD-10-CM | POA: Diagnosis not present

## 2021-07-13 DIAGNOSIS — E7849 Other hyperlipidemia: Secondary | ICD-10-CM | POA: Diagnosis not present

## 2021-07-13 DIAGNOSIS — I10 Essential (primary) hypertension: Secondary | ICD-10-CM | POA: Diagnosis not present

## 2021-07-13 DIAGNOSIS — R4182 Altered mental status, unspecified: Secondary | ICD-10-CM | POA: Diagnosis not present

## 2021-07-13 DIAGNOSIS — I2510 Atherosclerotic heart disease of native coronary artery without angina pectoris: Secondary | ICD-10-CM | POA: Diagnosis not present

## 2021-07-13 DIAGNOSIS — K219 Gastro-esophageal reflux disease without esophagitis: Secondary | ICD-10-CM | POA: Diagnosis not present

## 2021-07-13 DIAGNOSIS — E871 Hypo-osmolality and hyponatremia: Secondary | ICD-10-CM | POA: Diagnosis not present

## 2021-07-16 DIAGNOSIS — E871 Hypo-osmolality and hyponatremia: Secondary | ICD-10-CM | POA: Diagnosis not present

## 2021-07-16 DIAGNOSIS — K219 Gastro-esophageal reflux disease without esophagitis: Secondary | ICD-10-CM | POA: Diagnosis not present

## 2021-07-16 DIAGNOSIS — S728X2D Other fracture of left femur, subsequent encounter for closed fracture with routine healing: Secondary | ICD-10-CM | POA: Diagnosis not present

## 2021-07-16 DIAGNOSIS — I2510 Atherosclerotic heart disease of native coronary artery without angina pectoris: Secondary | ICD-10-CM | POA: Diagnosis not present

## 2021-07-16 DIAGNOSIS — I10 Essential (primary) hypertension: Secondary | ICD-10-CM | POA: Diagnosis not present

## 2021-07-16 DIAGNOSIS — M6281 Muscle weakness (generalized): Secondary | ICD-10-CM | POA: Diagnosis not present

## 2021-07-16 DIAGNOSIS — R4182 Altered mental status, unspecified: Secondary | ICD-10-CM | POA: Diagnosis not present

## 2021-07-19 DIAGNOSIS — Z96642 Presence of left artificial hip joint: Secondary | ICD-10-CM | POA: Diagnosis not present

## 2021-07-19 DIAGNOSIS — E785 Hyperlipidemia, unspecified: Secondary | ICD-10-CM | POA: Diagnosis not present

## 2021-07-19 DIAGNOSIS — F419 Anxiety disorder, unspecified: Secondary | ICD-10-CM | POA: Diagnosis not present

## 2021-07-19 DIAGNOSIS — R4182 Altered mental status, unspecified: Secondary | ICD-10-CM | POA: Diagnosis not present

## 2021-07-19 DIAGNOSIS — I2510 Atherosclerotic heart disease of native coronary artery without angina pectoris: Secondary | ICD-10-CM | POA: Diagnosis not present

## 2021-07-19 DIAGNOSIS — K5900 Constipation, unspecified: Secondary | ICD-10-CM | POA: Diagnosis not present

## 2021-07-19 DIAGNOSIS — R112 Nausea with vomiting, unspecified: Secondary | ICD-10-CM | POA: Diagnosis not present

## 2021-07-19 DIAGNOSIS — Z5189 Encounter for other specified aftercare: Secondary | ICD-10-CM | POA: Diagnosis not present

## 2021-07-19 DIAGNOSIS — K219 Gastro-esophageal reflux disease without esophagitis: Secondary | ICD-10-CM | POA: Diagnosis not present

## 2021-07-19 DIAGNOSIS — S72002D Fracture of unspecified part of neck of left femur, subsequent encounter for closed fracture with routine healing: Secondary | ICD-10-CM | POA: Diagnosis not present

## 2021-07-19 DIAGNOSIS — E559 Vitamin D deficiency, unspecified: Secondary | ICD-10-CM | POA: Diagnosis not present

## 2021-07-19 DIAGNOSIS — N393 Stress incontinence (female) (male): Secondary | ICD-10-CM | POA: Diagnosis not present

## 2021-07-19 DIAGNOSIS — I10 Essential (primary) hypertension: Secondary | ICD-10-CM | POA: Diagnosis not present

## 2021-07-19 DIAGNOSIS — E538 Deficiency of other specified B group vitamins: Secondary | ICD-10-CM | POA: Diagnosis not present

## 2021-07-19 DIAGNOSIS — Z7902 Long term (current) use of antithrombotics/antiplatelets: Secondary | ICD-10-CM | POA: Diagnosis not present

## 2021-07-19 DIAGNOSIS — R55 Syncope and collapse: Secondary | ICD-10-CM | POA: Diagnosis not present

## 2021-07-19 DIAGNOSIS — Z79899 Other long term (current) drug therapy: Secondary | ICD-10-CM | POA: Diagnosis not present

## 2021-07-19 DIAGNOSIS — M810 Age-related osteoporosis without current pathological fracture: Secondary | ICD-10-CM | POA: Diagnosis not present

## 2021-07-19 NOTE — Telephone Encounter (Signed)
Left VM requesting pt to call the office back 

## 2021-07-20 DIAGNOSIS — R4182 Altered mental status, unspecified: Secondary | ICD-10-CM | POA: Diagnosis not present

## 2021-07-20 DIAGNOSIS — I10 Essential (primary) hypertension: Secondary | ICD-10-CM | POA: Diagnosis not present

## 2021-07-20 DIAGNOSIS — K219 Gastro-esophageal reflux disease without esophagitis: Secondary | ICD-10-CM | POA: Diagnosis not present

## 2021-07-20 DIAGNOSIS — E785 Hyperlipidemia, unspecified: Secondary | ICD-10-CM | POA: Diagnosis not present

## 2021-07-20 DIAGNOSIS — S72002D Fracture of unspecified part of neck of left femur, subsequent encounter for closed fracture with routine healing: Secondary | ICD-10-CM | POA: Diagnosis not present

## 2021-07-20 DIAGNOSIS — I2510 Atherosclerotic heart disease of native coronary artery without angina pectoris: Secondary | ICD-10-CM | POA: Diagnosis not present

## 2021-07-23 ENCOUNTER — Other Ambulatory Visit: Payer: Self-pay | Admitting: Internal Medicine

## 2021-07-23 ENCOUNTER — Other Ambulatory Visit: Payer: Self-pay | Admitting: Cardiovascular Disease

## 2021-07-24 DIAGNOSIS — K219 Gastro-esophageal reflux disease without esophagitis: Secondary | ICD-10-CM | POA: Diagnosis not present

## 2021-07-24 DIAGNOSIS — E785 Hyperlipidemia, unspecified: Secondary | ICD-10-CM | POA: Diagnosis not present

## 2021-07-24 DIAGNOSIS — I2510 Atherosclerotic heart disease of native coronary artery without angina pectoris: Secondary | ICD-10-CM | POA: Diagnosis not present

## 2021-07-24 DIAGNOSIS — S72002D Fracture of unspecified part of neck of left femur, subsequent encounter for closed fracture with routine healing: Secondary | ICD-10-CM | POA: Diagnosis not present

## 2021-07-24 DIAGNOSIS — I10 Essential (primary) hypertension: Secondary | ICD-10-CM | POA: Diagnosis not present

## 2021-07-24 DIAGNOSIS — R4182 Altered mental status, unspecified: Secondary | ICD-10-CM | POA: Diagnosis not present

## 2021-07-25 ENCOUNTER — Telehealth: Payer: Self-pay | Admitting: Family Medicine

## 2021-07-25 ENCOUNTER — Ambulatory Visit: Payer: Medicare Other

## 2021-07-25 DIAGNOSIS — I2510 Atherosclerotic heart disease of native coronary artery without angina pectoris: Secondary | ICD-10-CM | POA: Diagnosis not present

## 2021-07-25 DIAGNOSIS — I10 Essential (primary) hypertension: Secondary | ICD-10-CM | POA: Diagnosis not present

## 2021-07-25 DIAGNOSIS — K219 Gastro-esophageal reflux disease without esophagitis: Secondary | ICD-10-CM | POA: Diagnosis not present

## 2021-07-25 DIAGNOSIS — R4182 Altered mental status, unspecified: Secondary | ICD-10-CM | POA: Diagnosis not present

## 2021-07-25 DIAGNOSIS — E785 Hyperlipidemia, unspecified: Secondary | ICD-10-CM | POA: Diagnosis not present

## 2021-07-25 DIAGNOSIS — S72002D Fracture of unspecified part of neck of left femur, subsequent encounter for closed fracture with routine healing: Secondary | ICD-10-CM | POA: Diagnosis not present

## 2021-07-25 NOTE — Telephone Encounter (Signed)
No # provided for pt's niece so called pt (# left in message) and no answer so Left VM requesting pt to call the office back  ?

## 2021-07-25 NOTE — Telephone Encounter (Signed)
In person would be better but if she is not here and cannot do that we can start with a virtual.  If they can have some vitals that is helpful.  Thanks for the heads up ?

## 2021-07-25 NOTE — Telephone Encounter (Signed)
Patient's niece has called stating that they are out of town but the patient has had some BP issues. Also had some falls recently. They called to discuss making a virtual appointment with Dr.Tower to discuss what has been going on, wanted to make sure setting up a virtual appointment was okay or is there something else the patient should do with being out of town (i.e see another provider there in the meantime) Please advise  ?

## 2021-07-26 DIAGNOSIS — R4182 Altered mental status, unspecified: Secondary | ICD-10-CM | POA: Diagnosis not present

## 2021-07-26 DIAGNOSIS — E785 Hyperlipidemia, unspecified: Secondary | ICD-10-CM | POA: Diagnosis not present

## 2021-07-26 DIAGNOSIS — K219 Gastro-esophageal reflux disease without esophagitis: Secondary | ICD-10-CM | POA: Diagnosis not present

## 2021-07-26 DIAGNOSIS — I10 Essential (primary) hypertension: Secondary | ICD-10-CM | POA: Diagnosis not present

## 2021-07-26 DIAGNOSIS — I2510 Atherosclerotic heart disease of native coronary artery without angina pectoris: Secondary | ICD-10-CM | POA: Diagnosis not present

## 2021-07-26 DIAGNOSIS — S72002D Fracture of unspecified part of neck of left femur, subsequent encounter for closed fracture with routine healing: Secondary | ICD-10-CM | POA: Diagnosis not present

## 2021-07-30 DIAGNOSIS — R4182 Altered mental status, unspecified: Secondary | ICD-10-CM | POA: Diagnosis not present

## 2021-07-30 DIAGNOSIS — S72002D Fracture of unspecified part of neck of left femur, subsequent encounter for closed fracture with routine healing: Secondary | ICD-10-CM | POA: Diagnosis not present

## 2021-07-30 DIAGNOSIS — E785 Hyperlipidemia, unspecified: Secondary | ICD-10-CM | POA: Diagnosis not present

## 2021-07-30 DIAGNOSIS — I10 Essential (primary) hypertension: Secondary | ICD-10-CM | POA: Diagnosis not present

## 2021-07-30 DIAGNOSIS — K219 Gastro-esophageal reflux disease without esophagitis: Secondary | ICD-10-CM | POA: Diagnosis not present

## 2021-07-30 DIAGNOSIS — I2510 Atherosclerotic heart disease of native coronary artery without angina pectoris: Secondary | ICD-10-CM | POA: Diagnosis not present

## 2021-07-31 DIAGNOSIS — S72002D Fracture of unspecified part of neck of left femur, subsequent encounter for closed fracture with routine healing: Secondary | ICD-10-CM | POA: Diagnosis not present

## 2021-07-31 DIAGNOSIS — I10 Essential (primary) hypertension: Secondary | ICD-10-CM | POA: Diagnosis not present

## 2021-07-31 DIAGNOSIS — I2510 Atherosclerotic heart disease of native coronary artery without angina pectoris: Secondary | ICD-10-CM | POA: Diagnosis not present

## 2021-07-31 DIAGNOSIS — R4182 Altered mental status, unspecified: Secondary | ICD-10-CM | POA: Diagnosis not present

## 2021-07-31 DIAGNOSIS — E785 Hyperlipidemia, unspecified: Secondary | ICD-10-CM | POA: Diagnosis not present

## 2021-07-31 DIAGNOSIS — K219 Gastro-esophageal reflux disease without esophagitis: Secondary | ICD-10-CM | POA: Diagnosis not present

## 2021-08-01 DIAGNOSIS — I2510 Atherosclerotic heart disease of native coronary artery without angina pectoris: Secondary | ICD-10-CM | POA: Diagnosis not present

## 2021-08-01 DIAGNOSIS — E785 Hyperlipidemia, unspecified: Secondary | ICD-10-CM | POA: Diagnosis not present

## 2021-08-01 DIAGNOSIS — S72002D Fracture of unspecified part of neck of left femur, subsequent encounter for closed fracture with routine healing: Secondary | ICD-10-CM | POA: Diagnosis not present

## 2021-08-01 DIAGNOSIS — I10 Essential (primary) hypertension: Secondary | ICD-10-CM | POA: Diagnosis not present

## 2021-08-01 DIAGNOSIS — R4182 Altered mental status, unspecified: Secondary | ICD-10-CM | POA: Diagnosis not present

## 2021-08-01 DIAGNOSIS — K219 Gastro-esophageal reflux disease without esophagitis: Secondary | ICD-10-CM | POA: Diagnosis not present

## 2021-08-02 DIAGNOSIS — I2510 Atherosclerotic heart disease of native coronary artery without angina pectoris: Secondary | ICD-10-CM | POA: Diagnosis not present

## 2021-08-02 DIAGNOSIS — K219 Gastro-esophageal reflux disease without esophagitis: Secondary | ICD-10-CM | POA: Diagnosis not present

## 2021-08-02 DIAGNOSIS — I10 Essential (primary) hypertension: Secondary | ICD-10-CM | POA: Diagnosis not present

## 2021-08-02 DIAGNOSIS — R4182 Altered mental status, unspecified: Secondary | ICD-10-CM | POA: Diagnosis not present

## 2021-08-02 DIAGNOSIS — S72002D Fracture of unspecified part of neck of left femur, subsequent encounter for closed fracture with routine healing: Secondary | ICD-10-CM | POA: Diagnosis not present

## 2021-08-02 DIAGNOSIS — E785 Hyperlipidemia, unspecified: Secondary | ICD-10-CM | POA: Diagnosis not present

## 2021-08-03 DIAGNOSIS — I10 Essential (primary) hypertension: Secondary | ICD-10-CM | POA: Diagnosis not present

## 2021-08-03 DIAGNOSIS — R4182 Altered mental status, unspecified: Secondary | ICD-10-CM | POA: Diagnosis not present

## 2021-08-03 DIAGNOSIS — I2510 Atherosclerotic heart disease of native coronary artery without angina pectoris: Secondary | ICD-10-CM | POA: Diagnosis not present

## 2021-08-03 DIAGNOSIS — E785 Hyperlipidemia, unspecified: Secondary | ICD-10-CM | POA: Diagnosis not present

## 2021-08-03 DIAGNOSIS — S72002D Fracture of unspecified part of neck of left femur, subsequent encounter for closed fracture with routine healing: Secondary | ICD-10-CM | POA: Diagnosis not present

## 2021-08-03 DIAGNOSIS — K219 Gastro-esophageal reflux disease without esophagitis: Secondary | ICD-10-CM | POA: Diagnosis not present

## 2021-08-06 DIAGNOSIS — K219 Gastro-esophageal reflux disease without esophagitis: Secondary | ICD-10-CM | POA: Diagnosis not present

## 2021-08-06 DIAGNOSIS — S72002D Fracture of unspecified part of neck of left femur, subsequent encounter for closed fracture with routine healing: Secondary | ICD-10-CM | POA: Diagnosis not present

## 2021-08-06 DIAGNOSIS — R4182 Altered mental status, unspecified: Secondary | ICD-10-CM | POA: Diagnosis not present

## 2021-08-06 DIAGNOSIS — E785 Hyperlipidemia, unspecified: Secondary | ICD-10-CM | POA: Diagnosis not present

## 2021-08-06 DIAGNOSIS — I2510 Atherosclerotic heart disease of native coronary artery without angina pectoris: Secondary | ICD-10-CM | POA: Diagnosis not present

## 2021-08-06 DIAGNOSIS — I10 Essential (primary) hypertension: Secondary | ICD-10-CM | POA: Diagnosis not present

## 2021-08-07 ENCOUNTER — Telehealth: Payer: Self-pay | Admitting: Cardiovascular Disease

## 2021-08-07 DIAGNOSIS — E785 Hyperlipidemia, unspecified: Secondary | ICD-10-CM | POA: Diagnosis not present

## 2021-08-07 DIAGNOSIS — K219 Gastro-esophageal reflux disease without esophagitis: Secondary | ICD-10-CM | POA: Diagnosis not present

## 2021-08-07 DIAGNOSIS — R4182 Altered mental status, unspecified: Secondary | ICD-10-CM | POA: Diagnosis not present

## 2021-08-07 DIAGNOSIS — I10 Essential (primary) hypertension: Secondary | ICD-10-CM | POA: Diagnosis not present

## 2021-08-07 DIAGNOSIS — I2510 Atherosclerotic heart disease of native coronary artery without angina pectoris: Secondary | ICD-10-CM | POA: Diagnosis not present

## 2021-08-07 DIAGNOSIS — S72002D Fracture of unspecified part of neck of left femur, subsequent encounter for closed fracture with routine healing: Secondary | ICD-10-CM | POA: Diagnosis not present

## 2021-08-07 NOTE — Telephone Encounter (Signed)
Pt c/o medication issue:  1. Name of Medication:   isosorbide mononitrate (IMDUR) 30 MG 24 hr tablet    2. How are you currently taking this medication (dosage and times per day)?  TAKE 1/2 TABLET(15 MG) BY MOUTH DAILY  3. Are you having a reaction (difficulty breathing--STAT)?   4. What is your medication issue? She was passing out.  She passed out 3x's in rehab and 2x's while she was in the hospital.  Once while she was home.  She is now off the medication and she is doing better. She broke her hip that is why she was in the hospital.  She is now back home in [place].

## 2021-08-07 NOTE — Telephone Encounter (Signed)
Patient's daughter, Lelon Frohlich, called to try and get pt in to see Dr Glori Bickers. Michela Pitcher she is only available Thursday June 1 in the afternoon or Friday. Dr Glori Bickers is not here on Thursday afternoons and she is not scheduled to work on Friday, June 2. They will call back if they can figure out how to do a virtual visit. She was asking about the Imdur. I advised that Dr Rockey Situ is the one who writes that and it may be best to call his office to ask about that. I provided her with his number.

## 2021-08-08 DIAGNOSIS — R4182 Altered mental status, unspecified: Secondary | ICD-10-CM | POA: Diagnosis not present

## 2021-08-08 DIAGNOSIS — I10 Essential (primary) hypertension: Secondary | ICD-10-CM | POA: Diagnosis not present

## 2021-08-08 DIAGNOSIS — E785 Hyperlipidemia, unspecified: Secondary | ICD-10-CM | POA: Diagnosis not present

## 2021-08-08 DIAGNOSIS — I2510 Atherosclerotic heart disease of native coronary artery without angina pectoris: Secondary | ICD-10-CM | POA: Diagnosis not present

## 2021-08-08 DIAGNOSIS — K219 Gastro-esophageal reflux disease without esophagitis: Secondary | ICD-10-CM | POA: Diagnosis not present

## 2021-08-08 DIAGNOSIS — S72002D Fracture of unspecified part of neck of left femur, subsequent encounter for closed fracture with routine healing: Secondary | ICD-10-CM | POA: Diagnosis not present

## 2021-08-09 NOTE — Telephone Encounter (Signed)
Minna Merritts, MD  You 3 hours ago (11:05 AM)   With such frequent episodes of near syncope and syncope,  We may need reevaluation for cardiac arrhythmia  Would consider a zio monitor,  Alternatively, could meet with EP in clinic for consideration of a loop device (monitoring for 3 years)  Isosorbide previously used for high blood pressure  Would monitor BP at home,  Stay hydrated,  Sounds like there was a vasovagal spell in the hospital after a BM which can happen more when dehydrted.  Thx  TG    Called and spoke with patient's daughter. Lelon Frohlich reports that patient has been doing much better since being off the Imdur.   Has not had any syncope or near syncope, except for the day that she got home from rehab and they accidentally gave patient her imdur, that day her BP dropped to the 74J systolic and she was in the ER for most of the day.   Patient has home health assistance at home, and they are keeping an eye on her blood pressure. Lelon Frohlich states that it has been within normal limits, and she can send the readings through MyChart once she gets home.   Ann declines to have patient seen by EP at this time or wear a zio. States that she will call should patient have any more syncopal episodes or her blood pressure start to get high again.

## 2021-08-13 DIAGNOSIS — S72002D Fracture of unspecified part of neck of left femur, subsequent encounter for closed fracture with routine healing: Secondary | ICD-10-CM | POA: Diagnosis not present

## 2021-08-13 DIAGNOSIS — R4182 Altered mental status, unspecified: Secondary | ICD-10-CM | POA: Diagnosis not present

## 2021-08-13 DIAGNOSIS — E785 Hyperlipidemia, unspecified: Secondary | ICD-10-CM | POA: Diagnosis not present

## 2021-08-13 DIAGNOSIS — I2510 Atherosclerotic heart disease of native coronary artery without angina pectoris: Secondary | ICD-10-CM | POA: Diagnosis not present

## 2021-08-13 DIAGNOSIS — K219 Gastro-esophageal reflux disease without esophagitis: Secondary | ICD-10-CM | POA: Diagnosis not present

## 2021-08-13 DIAGNOSIS — I10 Essential (primary) hypertension: Secondary | ICD-10-CM | POA: Diagnosis not present

## 2021-08-14 DIAGNOSIS — I10 Essential (primary) hypertension: Secondary | ICD-10-CM | POA: Diagnosis not present

## 2021-08-14 DIAGNOSIS — K219 Gastro-esophageal reflux disease without esophagitis: Secondary | ICD-10-CM | POA: Diagnosis not present

## 2021-08-14 DIAGNOSIS — E785 Hyperlipidemia, unspecified: Secondary | ICD-10-CM | POA: Diagnosis not present

## 2021-08-14 DIAGNOSIS — S72002D Fracture of unspecified part of neck of left femur, subsequent encounter for closed fracture with routine healing: Secondary | ICD-10-CM | POA: Diagnosis not present

## 2021-08-14 DIAGNOSIS — I2510 Atherosclerotic heart disease of native coronary artery without angina pectoris: Secondary | ICD-10-CM | POA: Diagnosis not present

## 2021-08-14 DIAGNOSIS — R4182 Altered mental status, unspecified: Secondary | ICD-10-CM | POA: Diagnosis not present

## 2021-08-15 DIAGNOSIS — K219 Gastro-esophageal reflux disease without esophagitis: Secondary | ICD-10-CM | POA: Diagnosis not present

## 2021-08-15 DIAGNOSIS — E785 Hyperlipidemia, unspecified: Secondary | ICD-10-CM | POA: Diagnosis not present

## 2021-08-15 DIAGNOSIS — I10 Essential (primary) hypertension: Secondary | ICD-10-CM | POA: Diagnosis not present

## 2021-08-15 DIAGNOSIS — S72002D Fracture of unspecified part of neck of left femur, subsequent encounter for closed fracture with routine healing: Secondary | ICD-10-CM | POA: Diagnosis not present

## 2021-08-15 DIAGNOSIS — R4182 Altered mental status, unspecified: Secondary | ICD-10-CM | POA: Diagnosis not present

## 2021-08-15 DIAGNOSIS — I2510 Atherosclerotic heart disease of native coronary artery without angina pectoris: Secondary | ICD-10-CM | POA: Diagnosis not present

## 2021-08-16 DIAGNOSIS — I2510 Atherosclerotic heart disease of native coronary artery without angina pectoris: Secondary | ICD-10-CM | POA: Diagnosis not present

## 2021-08-16 DIAGNOSIS — R4182 Altered mental status, unspecified: Secondary | ICD-10-CM | POA: Diagnosis not present

## 2021-08-16 DIAGNOSIS — S72002D Fracture of unspecified part of neck of left femur, subsequent encounter for closed fracture with routine healing: Secondary | ICD-10-CM | POA: Diagnosis not present

## 2021-08-16 DIAGNOSIS — I10 Essential (primary) hypertension: Secondary | ICD-10-CM | POA: Diagnosis not present

## 2021-08-16 DIAGNOSIS — K219 Gastro-esophageal reflux disease without esophagitis: Secondary | ICD-10-CM | POA: Diagnosis not present

## 2021-08-16 DIAGNOSIS — E785 Hyperlipidemia, unspecified: Secondary | ICD-10-CM | POA: Diagnosis not present

## 2021-08-17 DIAGNOSIS — H02055 Trichiasis without entropian left lower eyelid: Secondary | ICD-10-CM | POA: Diagnosis not present

## 2021-08-18 DIAGNOSIS — K219 Gastro-esophageal reflux disease without esophagitis: Secondary | ICD-10-CM | POA: Diagnosis not present

## 2021-08-18 DIAGNOSIS — Z7902 Long term (current) use of antithrombotics/antiplatelets: Secondary | ICD-10-CM | POA: Diagnosis not present

## 2021-08-18 DIAGNOSIS — I10 Essential (primary) hypertension: Secondary | ICD-10-CM | POA: Diagnosis not present

## 2021-08-18 DIAGNOSIS — F419 Anxiety disorder, unspecified: Secondary | ICD-10-CM | POA: Diagnosis not present

## 2021-08-18 DIAGNOSIS — Z96642 Presence of left artificial hip joint: Secondary | ICD-10-CM | POA: Diagnosis not present

## 2021-08-18 DIAGNOSIS — I2510 Atherosclerotic heart disease of native coronary artery without angina pectoris: Secondary | ICD-10-CM | POA: Diagnosis not present

## 2021-08-18 DIAGNOSIS — S72002D Fracture of unspecified part of neck of left femur, subsequent encounter for closed fracture with routine healing: Secondary | ICD-10-CM | POA: Diagnosis not present

## 2021-08-18 DIAGNOSIS — E785 Hyperlipidemia, unspecified: Secondary | ICD-10-CM | POA: Diagnosis not present

## 2021-08-18 DIAGNOSIS — K5900 Constipation, unspecified: Secondary | ICD-10-CM | POA: Diagnosis not present

## 2021-08-18 DIAGNOSIS — E538 Deficiency of other specified B group vitamins: Secondary | ICD-10-CM | POA: Diagnosis not present

## 2021-08-18 DIAGNOSIS — M810 Age-related osteoporosis without current pathological fracture: Secondary | ICD-10-CM | POA: Diagnosis not present

## 2021-08-18 DIAGNOSIS — E559 Vitamin D deficiency, unspecified: Secondary | ICD-10-CM | POA: Diagnosis not present

## 2021-08-18 DIAGNOSIS — Z79899 Other long term (current) drug therapy: Secondary | ICD-10-CM | POA: Diagnosis not present

## 2021-08-18 DIAGNOSIS — N393 Stress incontinence (female) (male): Secondary | ICD-10-CM | POA: Diagnosis not present

## 2021-08-18 DIAGNOSIS — Z5189 Encounter for other specified aftercare: Secondary | ICD-10-CM | POA: Diagnosis not present

## 2021-08-18 DIAGNOSIS — R4182 Altered mental status, unspecified: Secondary | ICD-10-CM | POA: Diagnosis not present

## 2021-08-20 DIAGNOSIS — K219 Gastro-esophageal reflux disease without esophagitis: Secondary | ICD-10-CM | POA: Diagnosis not present

## 2021-08-20 DIAGNOSIS — I2510 Atherosclerotic heart disease of native coronary artery without angina pectoris: Secondary | ICD-10-CM | POA: Diagnosis not present

## 2021-08-20 DIAGNOSIS — R4182 Altered mental status, unspecified: Secondary | ICD-10-CM | POA: Diagnosis not present

## 2021-08-20 DIAGNOSIS — I10 Essential (primary) hypertension: Secondary | ICD-10-CM | POA: Diagnosis not present

## 2021-08-20 DIAGNOSIS — E785 Hyperlipidemia, unspecified: Secondary | ICD-10-CM | POA: Diagnosis not present

## 2021-08-20 DIAGNOSIS — S72002D Fracture of unspecified part of neck of left femur, subsequent encounter for closed fracture with routine healing: Secondary | ICD-10-CM | POA: Diagnosis not present

## 2021-08-21 DIAGNOSIS — I2510 Atherosclerotic heart disease of native coronary artery without angina pectoris: Secondary | ICD-10-CM | POA: Diagnosis not present

## 2021-08-21 DIAGNOSIS — I10 Essential (primary) hypertension: Secondary | ICD-10-CM | POA: Diagnosis not present

## 2021-08-21 DIAGNOSIS — E785 Hyperlipidemia, unspecified: Secondary | ICD-10-CM | POA: Diagnosis not present

## 2021-08-21 DIAGNOSIS — R4182 Altered mental status, unspecified: Secondary | ICD-10-CM | POA: Diagnosis not present

## 2021-08-21 DIAGNOSIS — S72002D Fracture of unspecified part of neck of left femur, subsequent encounter for closed fracture with routine healing: Secondary | ICD-10-CM | POA: Diagnosis not present

## 2021-08-21 DIAGNOSIS — K219 Gastro-esophageal reflux disease without esophagitis: Secondary | ICD-10-CM | POA: Diagnosis not present

## 2021-08-22 DIAGNOSIS — R4182 Altered mental status, unspecified: Secondary | ICD-10-CM | POA: Diagnosis not present

## 2021-08-22 DIAGNOSIS — I10 Essential (primary) hypertension: Secondary | ICD-10-CM | POA: Diagnosis not present

## 2021-08-22 DIAGNOSIS — K219 Gastro-esophageal reflux disease without esophagitis: Secondary | ICD-10-CM | POA: Diagnosis not present

## 2021-08-22 DIAGNOSIS — I2510 Atherosclerotic heart disease of native coronary artery without angina pectoris: Secondary | ICD-10-CM | POA: Diagnosis not present

## 2021-08-22 DIAGNOSIS — S72002D Fracture of unspecified part of neck of left femur, subsequent encounter for closed fracture with routine healing: Secondary | ICD-10-CM | POA: Diagnosis not present

## 2021-08-22 DIAGNOSIS — E785 Hyperlipidemia, unspecified: Secondary | ICD-10-CM | POA: Diagnosis not present

## 2021-08-23 DIAGNOSIS — R4182 Altered mental status, unspecified: Secondary | ICD-10-CM | POA: Diagnosis not present

## 2021-08-23 DIAGNOSIS — K219 Gastro-esophageal reflux disease without esophagitis: Secondary | ICD-10-CM | POA: Diagnosis not present

## 2021-08-23 DIAGNOSIS — E785 Hyperlipidemia, unspecified: Secondary | ICD-10-CM | POA: Diagnosis not present

## 2021-08-23 DIAGNOSIS — I10 Essential (primary) hypertension: Secondary | ICD-10-CM | POA: Diagnosis not present

## 2021-08-23 DIAGNOSIS — I2510 Atherosclerotic heart disease of native coronary artery without angina pectoris: Secondary | ICD-10-CM | POA: Diagnosis not present

## 2021-08-23 DIAGNOSIS — S72002D Fracture of unspecified part of neck of left femur, subsequent encounter for closed fracture with routine healing: Secondary | ICD-10-CM | POA: Diagnosis not present

## 2021-08-24 DIAGNOSIS — R4182 Altered mental status, unspecified: Secondary | ICD-10-CM | POA: Diagnosis not present

## 2021-08-24 DIAGNOSIS — L820 Inflamed seborrheic keratosis: Secondary | ICD-10-CM | POA: Diagnosis not present

## 2021-08-24 DIAGNOSIS — L570 Actinic keratosis: Secondary | ICD-10-CM | POA: Diagnosis not present

## 2021-08-24 DIAGNOSIS — E785 Hyperlipidemia, unspecified: Secondary | ICD-10-CM | POA: Diagnosis not present

## 2021-08-24 DIAGNOSIS — K219 Gastro-esophageal reflux disease without esophagitis: Secondary | ICD-10-CM | POA: Diagnosis not present

## 2021-08-24 DIAGNOSIS — L821 Other seborrheic keratosis: Secondary | ICD-10-CM | POA: Diagnosis not present

## 2021-08-24 DIAGNOSIS — S72002D Fracture of unspecified part of neck of left femur, subsequent encounter for closed fracture with routine healing: Secondary | ICD-10-CM | POA: Diagnosis not present

## 2021-08-24 DIAGNOSIS — I2510 Atherosclerotic heart disease of native coronary artery without angina pectoris: Secondary | ICD-10-CM | POA: Diagnosis not present

## 2021-08-24 DIAGNOSIS — L298 Other pruritus: Secondary | ICD-10-CM | POA: Diagnosis not present

## 2021-08-24 DIAGNOSIS — I10 Essential (primary) hypertension: Secondary | ICD-10-CM | POA: Diagnosis not present

## 2021-08-27 DIAGNOSIS — K219 Gastro-esophageal reflux disease without esophagitis: Secondary | ICD-10-CM | POA: Diagnosis not present

## 2021-08-27 DIAGNOSIS — I2510 Atherosclerotic heart disease of native coronary artery without angina pectoris: Secondary | ICD-10-CM | POA: Diagnosis not present

## 2021-08-27 DIAGNOSIS — R4182 Altered mental status, unspecified: Secondary | ICD-10-CM | POA: Diagnosis not present

## 2021-08-27 DIAGNOSIS — S72002D Fracture of unspecified part of neck of left femur, subsequent encounter for closed fracture with routine healing: Secondary | ICD-10-CM | POA: Diagnosis not present

## 2021-08-27 DIAGNOSIS — I10 Essential (primary) hypertension: Secondary | ICD-10-CM | POA: Diagnosis not present

## 2021-08-27 DIAGNOSIS — E785 Hyperlipidemia, unspecified: Secondary | ICD-10-CM | POA: Diagnosis not present

## 2021-08-28 DIAGNOSIS — K219 Gastro-esophageal reflux disease without esophagitis: Secondary | ICD-10-CM | POA: Diagnosis not present

## 2021-08-28 DIAGNOSIS — E785 Hyperlipidemia, unspecified: Secondary | ICD-10-CM | POA: Diagnosis not present

## 2021-08-28 DIAGNOSIS — R4182 Altered mental status, unspecified: Secondary | ICD-10-CM | POA: Diagnosis not present

## 2021-08-28 DIAGNOSIS — S72002D Fracture of unspecified part of neck of left femur, subsequent encounter for closed fracture with routine healing: Secondary | ICD-10-CM | POA: Diagnosis not present

## 2021-08-28 DIAGNOSIS — I10 Essential (primary) hypertension: Secondary | ICD-10-CM | POA: Diagnosis not present

## 2021-08-28 DIAGNOSIS — I2510 Atherosclerotic heart disease of native coronary artery without angina pectoris: Secondary | ICD-10-CM | POA: Diagnosis not present

## 2021-08-29 ENCOUNTER — Telehealth: Payer: Self-pay | Admitting: Cardiovascular Disease

## 2021-08-29 ENCOUNTER — Other Ambulatory Visit: Payer: Self-pay

## 2021-08-29 DIAGNOSIS — E785 Hyperlipidemia, unspecified: Secondary | ICD-10-CM | POA: Diagnosis not present

## 2021-08-29 DIAGNOSIS — K219 Gastro-esophageal reflux disease without esophagitis: Secondary | ICD-10-CM | POA: Diagnosis not present

## 2021-08-29 DIAGNOSIS — S72002D Fracture of unspecified part of neck of left femur, subsequent encounter for closed fracture with routine healing: Secondary | ICD-10-CM | POA: Diagnosis not present

## 2021-08-29 DIAGNOSIS — R4182 Altered mental status, unspecified: Secondary | ICD-10-CM | POA: Diagnosis not present

## 2021-08-29 DIAGNOSIS — I10 Essential (primary) hypertension: Secondary | ICD-10-CM | POA: Diagnosis not present

## 2021-08-29 DIAGNOSIS — I2510 Atherosclerotic heart disease of native coronary artery without angina pectoris: Secondary | ICD-10-CM | POA: Diagnosis not present

## 2021-08-29 MED ORDER — NITROGLYCERIN 0.4 MG SL SUBL: SUBLINGUAL_TABLET | SUBLINGUAL | 1 refills | Status: DC

## 2021-08-29 NOTE — Telephone Encounter (Signed)
nitroGLYCERIN (NITROSTAT) 0.4 MG SL tablet 25 tablet 1 08/29/2021    Sig: PLACE 1 TABLET UNDER TONGUE EVERY 5 MIN AS NEEDED FOR CHEST PAIN IF NO RELIEF IN15 MIN CALL 911 (MAX 3 TABS)   Sent to pharmacy as: nitroGLYCERIN (NITROSTAT) 0.4 MG SL tablet   E-Prescribing Status: Receipt confirmed by pharmacy (08/29/2021  9:45 AM EDT)    Pharmacy  [id number] - [place], [place]

## 2021-08-29 NOTE — Telephone Encounter (Signed)
*  STAT* If patient is at the pharmacy, call can be transferred to refill team.   1. Which medications need to be refilled? (please list name of each medication and dose if known) nitroGLYCERIN (NITROSTAT) 0.4 MG SL tablet  2. Which pharmacy/location (including street and city if local pharmacy) is medication to be sent to?  [facility] [place], [place]  3. Do they need a 30 day or 90 day supply?    Pt's daughter states that between all the traveling, her mother has lost this medication and they need another refill of this.

## 2021-08-30 ENCOUNTER — Telehealth: Payer: Self-pay

## 2021-08-30 DIAGNOSIS — I10 Essential (primary) hypertension: Secondary | ICD-10-CM | POA: Diagnosis not present

## 2021-08-30 DIAGNOSIS — I2510 Atherosclerotic heart disease of native coronary artery without angina pectoris: Secondary | ICD-10-CM | POA: Diagnosis not present

## 2021-08-30 DIAGNOSIS — S72002D Fracture of unspecified part of neck of left femur, subsequent encounter for closed fracture with routine healing: Secondary | ICD-10-CM | POA: Diagnosis not present

## 2021-08-30 DIAGNOSIS — R4182 Altered mental status, unspecified: Secondary | ICD-10-CM | POA: Diagnosis not present

## 2021-08-30 DIAGNOSIS — K219 Gastro-esophageal reflux disease without esophagitis: Secondary | ICD-10-CM | POA: Diagnosis not present

## 2021-08-30 DIAGNOSIS — E785 Hyperlipidemia, unspecified: Secondary | ICD-10-CM | POA: Diagnosis not present

## 2021-08-30 NOTE — Telephone Encounter (Signed)
That is fine with me Not sure if we can tell when her last one was however

## 2021-08-30 NOTE — Telephone Encounter (Signed)
Patient's daughter called in stating that Jenna Young is due for a B12 shot, and Jenna Young is in [place] with her daughter. Ann wants to know if we can fax a prescription to [facility] Address: [address], [place] [address] phone number is - [phone]. Lelon Frohlich is asking if we can see when her last B12 injection when she was in rehab.

## 2021-08-30 NOTE — Telephone Encounter (Signed)
Is this okay?

## 2021-08-31 DIAGNOSIS — K219 Gastro-esophageal reflux disease without esophagitis: Secondary | ICD-10-CM | POA: Diagnosis not present

## 2021-08-31 DIAGNOSIS — E785 Hyperlipidemia, unspecified: Secondary | ICD-10-CM | POA: Diagnosis not present

## 2021-08-31 DIAGNOSIS — I2510 Atherosclerotic heart disease of native coronary artery without angina pectoris: Secondary | ICD-10-CM | POA: Diagnosis not present

## 2021-08-31 DIAGNOSIS — R4182 Altered mental status, unspecified: Secondary | ICD-10-CM | POA: Diagnosis not present

## 2021-08-31 DIAGNOSIS — I10 Essential (primary) hypertension: Secondary | ICD-10-CM | POA: Diagnosis not present

## 2021-08-31 DIAGNOSIS — S72002D Fracture of unspecified part of neck of left femur, subsequent encounter for closed fracture with routine healing: Secondary | ICD-10-CM | POA: Diagnosis not present

## 2021-09-03 DIAGNOSIS — K219 Gastro-esophageal reflux disease without esophagitis: Secondary | ICD-10-CM | POA: Diagnosis not present

## 2021-09-03 DIAGNOSIS — R4182 Altered mental status, unspecified: Secondary | ICD-10-CM | POA: Diagnosis not present

## 2021-09-03 DIAGNOSIS — E785 Hyperlipidemia, unspecified: Secondary | ICD-10-CM | POA: Diagnosis not present

## 2021-09-03 DIAGNOSIS — I10 Essential (primary) hypertension: Secondary | ICD-10-CM | POA: Diagnosis not present

## 2021-09-03 DIAGNOSIS — S72002D Fracture of unspecified part of neck of left femur, subsequent encounter for closed fracture with routine healing: Secondary | ICD-10-CM | POA: Diagnosis not present

## 2021-09-03 DIAGNOSIS — I2510 Atherosclerotic heart disease of native coronary artery without angina pectoris: Secondary | ICD-10-CM | POA: Diagnosis not present

## 2021-09-04 DIAGNOSIS — E785 Hyperlipidemia, unspecified: Secondary | ICD-10-CM | POA: Diagnosis not present

## 2021-09-04 DIAGNOSIS — I10 Essential (primary) hypertension: Secondary | ICD-10-CM | POA: Diagnosis not present

## 2021-09-04 DIAGNOSIS — S72002D Fracture of unspecified part of neck of left femur, subsequent encounter for closed fracture with routine healing: Secondary | ICD-10-CM | POA: Diagnosis not present

## 2021-09-04 DIAGNOSIS — R4182 Altered mental status, unspecified: Secondary | ICD-10-CM | POA: Diagnosis not present

## 2021-09-04 DIAGNOSIS — K219 Gastro-esophageal reflux disease without esophagitis: Secondary | ICD-10-CM | POA: Diagnosis not present

## 2021-09-04 DIAGNOSIS — I2510 Atherosclerotic heart disease of native coronary artery without angina pectoris: Secondary | ICD-10-CM | POA: Diagnosis not present

## 2021-09-05 DIAGNOSIS — R4182 Altered mental status, unspecified: Secondary | ICD-10-CM | POA: Diagnosis not present

## 2021-09-05 DIAGNOSIS — I2510 Atherosclerotic heart disease of native coronary artery without angina pectoris: Secondary | ICD-10-CM | POA: Diagnosis not present

## 2021-09-05 DIAGNOSIS — S72002D Fracture of unspecified part of neck of left femur, subsequent encounter for closed fracture with routine healing: Secondary | ICD-10-CM | POA: Diagnosis not present

## 2021-09-05 DIAGNOSIS — E785 Hyperlipidemia, unspecified: Secondary | ICD-10-CM | POA: Diagnosis not present

## 2021-09-05 DIAGNOSIS — I10 Essential (primary) hypertension: Secondary | ICD-10-CM | POA: Diagnosis not present

## 2021-09-05 DIAGNOSIS — K219 Gastro-esophageal reflux disease without esophagitis: Secondary | ICD-10-CM | POA: Diagnosis not present

## 2021-09-06 DIAGNOSIS — E785 Hyperlipidemia, unspecified: Secondary | ICD-10-CM | POA: Diagnosis not present

## 2021-09-06 DIAGNOSIS — I2510 Atherosclerotic heart disease of native coronary artery without angina pectoris: Secondary | ICD-10-CM | POA: Diagnosis not present

## 2021-09-06 DIAGNOSIS — I10 Essential (primary) hypertension: Secondary | ICD-10-CM | POA: Diagnosis not present

## 2021-09-06 DIAGNOSIS — K219 Gastro-esophageal reflux disease without esophagitis: Secondary | ICD-10-CM | POA: Diagnosis not present

## 2021-09-06 DIAGNOSIS — S72002D Fracture of unspecified part of neck of left femur, subsequent encounter for closed fracture with routine healing: Secondary | ICD-10-CM | POA: Diagnosis not present

## 2021-09-06 DIAGNOSIS — R4182 Altered mental status, unspecified: Secondary | ICD-10-CM | POA: Diagnosis not present

## 2021-09-07 DIAGNOSIS — K219 Gastro-esophageal reflux disease without esophagitis: Secondary | ICD-10-CM | POA: Diagnosis not present

## 2021-09-07 DIAGNOSIS — R4182 Altered mental status, unspecified: Secondary | ICD-10-CM | POA: Diagnosis not present

## 2021-09-07 DIAGNOSIS — S72002D Fracture of unspecified part of neck of left femur, subsequent encounter for closed fracture with routine healing: Secondary | ICD-10-CM | POA: Diagnosis not present

## 2021-09-07 DIAGNOSIS — I2510 Atherosclerotic heart disease of native coronary artery without angina pectoris: Secondary | ICD-10-CM | POA: Diagnosis not present

## 2021-09-07 DIAGNOSIS — E785 Hyperlipidemia, unspecified: Secondary | ICD-10-CM | POA: Diagnosis not present

## 2021-09-07 DIAGNOSIS — I10 Essential (primary) hypertension: Secondary | ICD-10-CM | POA: Diagnosis not present

## 2021-09-10 DIAGNOSIS — K219 Gastro-esophageal reflux disease without esophagitis: Secondary | ICD-10-CM | POA: Diagnosis not present

## 2021-09-10 DIAGNOSIS — S72002D Fracture of unspecified part of neck of left femur, subsequent encounter for closed fracture with routine healing: Secondary | ICD-10-CM | POA: Diagnosis not present

## 2021-09-10 DIAGNOSIS — I10 Essential (primary) hypertension: Secondary | ICD-10-CM | POA: Diagnosis not present

## 2021-09-10 DIAGNOSIS — I2510 Atherosclerotic heart disease of native coronary artery without angina pectoris: Secondary | ICD-10-CM | POA: Diagnosis not present

## 2021-09-10 DIAGNOSIS — R4182 Altered mental status, unspecified: Secondary | ICD-10-CM | POA: Diagnosis not present

## 2021-09-10 DIAGNOSIS — E785 Hyperlipidemia, unspecified: Secondary | ICD-10-CM | POA: Diagnosis not present

## 2021-09-12 DIAGNOSIS — I2510 Atherosclerotic heart disease of native coronary artery without angina pectoris: Secondary | ICD-10-CM | POA: Diagnosis not present

## 2021-09-12 DIAGNOSIS — K219 Gastro-esophageal reflux disease without esophagitis: Secondary | ICD-10-CM | POA: Diagnosis not present

## 2021-09-12 DIAGNOSIS — E785 Hyperlipidemia, unspecified: Secondary | ICD-10-CM | POA: Diagnosis not present

## 2021-09-12 DIAGNOSIS — R4182 Altered mental status, unspecified: Secondary | ICD-10-CM | POA: Diagnosis not present

## 2021-09-12 DIAGNOSIS — I10 Essential (primary) hypertension: Secondary | ICD-10-CM | POA: Diagnosis not present

## 2021-09-12 DIAGNOSIS — S72002D Fracture of unspecified part of neck of left femur, subsequent encounter for closed fracture with routine healing: Secondary | ICD-10-CM | POA: Diagnosis not present

## 2021-09-13 DIAGNOSIS — I10 Essential (primary) hypertension: Secondary | ICD-10-CM | POA: Diagnosis not present

## 2021-09-13 DIAGNOSIS — K219 Gastro-esophageal reflux disease without esophagitis: Secondary | ICD-10-CM | POA: Diagnosis not present

## 2021-09-13 DIAGNOSIS — R4182 Altered mental status, unspecified: Secondary | ICD-10-CM | POA: Diagnosis not present

## 2021-09-13 DIAGNOSIS — S72002D Fracture of unspecified part of neck of left femur, subsequent encounter for closed fracture with routine healing: Secondary | ICD-10-CM | POA: Diagnosis not present

## 2021-09-13 DIAGNOSIS — I2510 Atherosclerotic heart disease of native coronary artery without angina pectoris: Secondary | ICD-10-CM | POA: Diagnosis not present

## 2021-09-13 DIAGNOSIS — E785 Hyperlipidemia, unspecified: Secondary | ICD-10-CM | POA: Diagnosis not present

## 2021-09-17 DIAGNOSIS — I2510 Atherosclerotic heart disease of native coronary artery without angina pectoris: Secondary | ICD-10-CM | POA: Diagnosis not present

## 2021-09-17 DIAGNOSIS — K5900 Constipation, unspecified: Secondary | ICD-10-CM | POA: Diagnosis not present

## 2021-09-17 DIAGNOSIS — M810 Age-related osteoporosis without current pathological fracture: Secondary | ICD-10-CM | POA: Diagnosis not present

## 2021-09-17 DIAGNOSIS — D649 Anemia, unspecified: Secondary | ICD-10-CM | POA: Diagnosis not present

## 2021-09-17 DIAGNOSIS — Z7902 Long term (current) use of antithrombotics/antiplatelets: Secondary | ICD-10-CM | POA: Diagnosis not present

## 2021-09-17 DIAGNOSIS — F419 Anxiety disorder, unspecified: Secondary | ICD-10-CM | POA: Diagnosis not present

## 2021-09-17 DIAGNOSIS — N393 Stress incontinence (female) (male): Secondary | ICD-10-CM | POA: Diagnosis not present

## 2021-09-17 DIAGNOSIS — Z5189 Encounter for other specified aftercare: Secondary | ICD-10-CM | POA: Diagnosis not present

## 2021-09-17 DIAGNOSIS — Z96642 Presence of left artificial hip joint: Secondary | ICD-10-CM | POA: Diagnosis not present

## 2021-09-17 DIAGNOSIS — E538 Deficiency of other specified B group vitamins: Secondary | ICD-10-CM | POA: Diagnosis not present

## 2021-09-17 DIAGNOSIS — K219 Gastro-esophageal reflux disease without esophagitis: Secondary | ICD-10-CM | POA: Diagnosis not present

## 2021-09-17 DIAGNOSIS — E559 Vitamin D deficiency, unspecified: Secondary | ICD-10-CM | POA: Diagnosis not present

## 2021-09-17 DIAGNOSIS — Z853 Personal history of malignant neoplasm of breast: Secondary | ICD-10-CM | POA: Diagnosis not present

## 2021-09-17 DIAGNOSIS — Z79899 Other long term (current) drug therapy: Secondary | ICD-10-CM | POA: Diagnosis not present

## 2021-09-17 DIAGNOSIS — E785 Hyperlipidemia, unspecified: Secondary | ICD-10-CM | POA: Diagnosis not present

## 2021-09-17 DIAGNOSIS — I10 Essential (primary) hypertension: Secondary | ICD-10-CM | POA: Diagnosis not present

## 2021-09-18 DIAGNOSIS — F419 Anxiety disorder, unspecified: Secondary | ICD-10-CM | POA: Diagnosis not present

## 2021-09-18 DIAGNOSIS — K219 Gastro-esophageal reflux disease without esophagitis: Secondary | ICD-10-CM | POA: Diagnosis not present

## 2021-09-18 DIAGNOSIS — E785 Hyperlipidemia, unspecified: Secondary | ICD-10-CM | POA: Diagnosis not present

## 2021-09-18 DIAGNOSIS — D649 Anemia, unspecified: Secondary | ICD-10-CM | POA: Diagnosis not present

## 2021-09-18 DIAGNOSIS — I2510 Atherosclerotic heart disease of native coronary artery without angina pectoris: Secondary | ICD-10-CM | POA: Diagnosis not present

## 2021-09-18 DIAGNOSIS — I10 Essential (primary) hypertension: Secondary | ICD-10-CM | POA: Diagnosis not present

## 2021-09-19 DIAGNOSIS — I2510 Atherosclerotic heart disease of native coronary artery without angina pectoris: Secondary | ICD-10-CM | POA: Diagnosis not present

## 2021-09-19 DIAGNOSIS — E785 Hyperlipidemia, unspecified: Secondary | ICD-10-CM | POA: Diagnosis not present

## 2021-09-19 DIAGNOSIS — D649 Anemia, unspecified: Secondary | ICD-10-CM | POA: Diagnosis not present

## 2021-09-19 DIAGNOSIS — F419 Anxiety disorder, unspecified: Secondary | ICD-10-CM | POA: Diagnosis not present

## 2021-09-19 DIAGNOSIS — K219 Gastro-esophageal reflux disease without esophagitis: Secondary | ICD-10-CM | POA: Diagnosis not present

## 2021-09-19 DIAGNOSIS — I10 Essential (primary) hypertension: Secondary | ICD-10-CM | POA: Diagnosis not present

## 2021-09-21 DIAGNOSIS — F419 Anxiety disorder, unspecified: Secondary | ICD-10-CM | POA: Diagnosis not present

## 2021-09-21 DIAGNOSIS — I10 Essential (primary) hypertension: Secondary | ICD-10-CM | POA: Diagnosis not present

## 2021-09-21 DIAGNOSIS — I2510 Atherosclerotic heart disease of native coronary artery without angina pectoris: Secondary | ICD-10-CM | POA: Diagnosis not present

## 2021-09-21 DIAGNOSIS — E785 Hyperlipidemia, unspecified: Secondary | ICD-10-CM | POA: Diagnosis not present

## 2021-09-21 DIAGNOSIS — D649 Anemia, unspecified: Secondary | ICD-10-CM | POA: Diagnosis not present

## 2021-09-21 DIAGNOSIS — K219 Gastro-esophageal reflux disease without esophagitis: Secondary | ICD-10-CM | POA: Diagnosis not present

## 2021-09-24 DIAGNOSIS — E785 Hyperlipidemia, unspecified: Secondary | ICD-10-CM | POA: Diagnosis not present

## 2021-09-24 DIAGNOSIS — K219 Gastro-esophageal reflux disease without esophagitis: Secondary | ICD-10-CM | POA: Diagnosis not present

## 2021-09-24 DIAGNOSIS — I10 Essential (primary) hypertension: Secondary | ICD-10-CM | POA: Diagnosis not present

## 2021-09-24 DIAGNOSIS — D649 Anemia, unspecified: Secondary | ICD-10-CM | POA: Diagnosis not present

## 2021-09-24 DIAGNOSIS — I2510 Atherosclerotic heart disease of native coronary artery without angina pectoris: Secondary | ICD-10-CM | POA: Diagnosis not present

## 2021-09-24 DIAGNOSIS — F419 Anxiety disorder, unspecified: Secondary | ICD-10-CM | POA: Diagnosis not present

## 2021-09-26 DIAGNOSIS — K219 Gastro-esophageal reflux disease without esophagitis: Secondary | ICD-10-CM | POA: Diagnosis not present

## 2021-09-26 DIAGNOSIS — I2510 Atherosclerotic heart disease of native coronary artery without angina pectoris: Secondary | ICD-10-CM | POA: Diagnosis not present

## 2021-09-26 DIAGNOSIS — D649 Anemia, unspecified: Secondary | ICD-10-CM | POA: Diagnosis not present

## 2021-09-26 DIAGNOSIS — E785 Hyperlipidemia, unspecified: Secondary | ICD-10-CM | POA: Diagnosis not present

## 2021-09-26 DIAGNOSIS — I10 Essential (primary) hypertension: Secondary | ICD-10-CM | POA: Diagnosis not present

## 2021-09-26 DIAGNOSIS — F419 Anxiety disorder, unspecified: Secondary | ICD-10-CM | POA: Diagnosis not present

## 2021-10-01 DIAGNOSIS — I10 Essential (primary) hypertension: Secondary | ICD-10-CM | POA: Diagnosis not present

## 2021-10-01 DIAGNOSIS — K219 Gastro-esophageal reflux disease without esophagitis: Secondary | ICD-10-CM | POA: Diagnosis not present

## 2021-10-01 DIAGNOSIS — F419 Anxiety disorder, unspecified: Secondary | ICD-10-CM | POA: Diagnosis not present

## 2021-10-01 DIAGNOSIS — I2510 Atherosclerotic heart disease of native coronary artery without angina pectoris: Secondary | ICD-10-CM | POA: Diagnosis not present

## 2021-10-01 DIAGNOSIS — E785 Hyperlipidemia, unspecified: Secondary | ICD-10-CM | POA: Diagnosis not present

## 2021-10-01 DIAGNOSIS — D649 Anemia, unspecified: Secondary | ICD-10-CM | POA: Diagnosis not present

## 2021-10-03 DIAGNOSIS — E785 Hyperlipidemia, unspecified: Secondary | ICD-10-CM | POA: Diagnosis not present

## 2021-10-03 DIAGNOSIS — I10 Essential (primary) hypertension: Secondary | ICD-10-CM | POA: Diagnosis not present

## 2021-10-03 DIAGNOSIS — F419 Anxiety disorder, unspecified: Secondary | ICD-10-CM | POA: Diagnosis not present

## 2021-10-03 DIAGNOSIS — K219 Gastro-esophageal reflux disease without esophagitis: Secondary | ICD-10-CM | POA: Diagnosis not present

## 2021-10-03 DIAGNOSIS — I2510 Atherosclerotic heart disease of native coronary artery without angina pectoris: Secondary | ICD-10-CM | POA: Diagnosis not present

## 2021-10-03 DIAGNOSIS — D649 Anemia, unspecified: Secondary | ICD-10-CM | POA: Diagnosis not present

## 2021-10-08 DIAGNOSIS — D649 Anemia, unspecified: Secondary | ICD-10-CM | POA: Diagnosis not present

## 2021-10-08 DIAGNOSIS — E785 Hyperlipidemia, unspecified: Secondary | ICD-10-CM | POA: Diagnosis not present

## 2021-10-08 DIAGNOSIS — I2510 Atherosclerotic heart disease of native coronary artery without angina pectoris: Secondary | ICD-10-CM | POA: Diagnosis not present

## 2021-10-08 DIAGNOSIS — F419 Anxiety disorder, unspecified: Secondary | ICD-10-CM | POA: Diagnosis not present

## 2021-10-08 DIAGNOSIS — K219 Gastro-esophageal reflux disease without esophagitis: Secondary | ICD-10-CM | POA: Diagnosis not present

## 2021-10-08 DIAGNOSIS — I10 Essential (primary) hypertension: Secondary | ICD-10-CM | POA: Diagnosis not present

## 2021-10-09 DIAGNOSIS — I2510 Atherosclerotic heart disease of native coronary artery without angina pectoris: Secondary | ICD-10-CM | POA: Diagnosis not present

## 2021-10-09 DIAGNOSIS — K219 Gastro-esophageal reflux disease without esophagitis: Secondary | ICD-10-CM | POA: Diagnosis not present

## 2021-10-09 DIAGNOSIS — F419 Anxiety disorder, unspecified: Secondary | ICD-10-CM | POA: Diagnosis not present

## 2021-10-09 DIAGNOSIS — S0501XA Injury of conjunctiva and corneal abrasion without foreign body, right eye, initial encounter: Secondary | ICD-10-CM | POA: Diagnosis not present

## 2021-10-09 DIAGNOSIS — E785 Hyperlipidemia, unspecified: Secondary | ICD-10-CM | POA: Diagnosis not present

## 2021-10-09 DIAGNOSIS — D649 Anemia, unspecified: Secondary | ICD-10-CM | POA: Diagnosis not present

## 2021-10-09 DIAGNOSIS — I10 Essential (primary) hypertension: Secondary | ICD-10-CM | POA: Diagnosis not present

## 2021-10-10 DIAGNOSIS — E785 Hyperlipidemia, unspecified: Secondary | ICD-10-CM | POA: Diagnosis not present

## 2021-10-10 DIAGNOSIS — D649 Anemia, unspecified: Secondary | ICD-10-CM | POA: Diagnosis not present

## 2021-10-10 DIAGNOSIS — I2510 Atherosclerotic heart disease of native coronary artery without angina pectoris: Secondary | ICD-10-CM | POA: Diagnosis not present

## 2021-10-10 DIAGNOSIS — I10 Essential (primary) hypertension: Secondary | ICD-10-CM | POA: Diagnosis not present

## 2021-10-10 DIAGNOSIS — F419 Anxiety disorder, unspecified: Secondary | ICD-10-CM | POA: Diagnosis not present

## 2021-10-10 DIAGNOSIS — K219 Gastro-esophageal reflux disease without esophagitis: Secondary | ICD-10-CM | POA: Diagnosis not present

## 2021-10-15 DIAGNOSIS — D649 Anemia, unspecified: Secondary | ICD-10-CM | POA: Diagnosis not present

## 2021-10-15 DIAGNOSIS — K219 Gastro-esophageal reflux disease without esophagitis: Secondary | ICD-10-CM | POA: Diagnosis not present

## 2021-10-15 DIAGNOSIS — I10 Essential (primary) hypertension: Secondary | ICD-10-CM | POA: Diagnosis not present

## 2021-10-15 DIAGNOSIS — E785 Hyperlipidemia, unspecified: Secondary | ICD-10-CM | POA: Diagnosis not present

## 2021-10-15 DIAGNOSIS — I2510 Atherosclerotic heart disease of native coronary artery without angina pectoris: Secondary | ICD-10-CM | POA: Diagnosis not present

## 2021-10-15 DIAGNOSIS — F419 Anxiety disorder, unspecified: Secondary | ICD-10-CM | POA: Diagnosis not present

## 2021-10-17 DIAGNOSIS — Z853 Personal history of malignant neoplasm of breast: Secondary | ICD-10-CM | POA: Diagnosis not present

## 2021-10-17 DIAGNOSIS — I10 Essential (primary) hypertension: Secondary | ICD-10-CM | POA: Diagnosis not present

## 2021-10-17 DIAGNOSIS — K219 Gastro-esophageal reflux disease without esophagitis: Secondary | ICD-10-CM | POA: Diagnosis not present

## 2021-10-17 DIAGNOSIS — E538 Deficiency of other specified B group vitamins: Secondary | ICD-10-CM | POA: Diagnosis not present

## 2021-10-17 DIAGNOSIS — D649 Anemia, unspecified: Secondary | ICD-10-CM | POA: Diagnosis not present

## 2021-10-17 DIAGNOSIS — Z79899 Other long term (current) drug therapy: Secondary | ICD-10-CM | POA: Diagnosis not present

## 2021-10-17 DIAGNOSIS — Z5189 Encounter for other specified aftercare: Secondary | ICD-10-CM | POA: Diagnosis not present

## 2021-10-17 DIAGNOSIS — N393 Stress incontinence (female) (male): Secondary | ICD-10-CM | POA: Diagnosis not present

## 2021-10-17 DIAGNOSIS — Z96642 Presence of left artificial hip joint: Secondary | ICD-10-CM | POA: Diagnosis not present

## 2021-10-17 DIAGNOSIS — E785 Hyperlipidemia, unspecified: Secondary | ICD-10-CM | POA: Diagnosis not present

## 2021-10-17 DIAGNOSIS — M810 Age-related osteoporosis without current pathological fracture: Secondary | ICD-10-CM | POA: Diagnosis not present

## 2021-10-17 DIAGNOSIS — Z7902 Long term (current) use of antithrombotics/antiplatelets: Secondary | ICD-10-CM | POA: Diagnosis not present

## 2021-10-17 DIAGNOSIS — E559 Vitamin D deficiency, unspecified: Secondary | ICD-10-CM | POA: Diagnosis not present

## 2021-10-17 DIAGNOSIS — I2510 Atherosclerotic heart disease of native coronary artery without angina pectoris: Secondary | ICD-10-CM | POA: Diagnosis not present

## 2021-10-17 DIAGNOSIS — K5900 Constipation, unspecified: Secondary | ICD-10-CM | POA: Diagnosis not present

## 2021-10-17 DIAGNOSIS — F419 Anxiety disorder, unspecified: Secondary | ICD-10-CM | POA: Diagnosis not present

## 2021-10-22 DIAGNOSIS — F419 Anxiety disorder, unspecified: Secondary | ICD-10-CM | POA: Diagnosis not present

## 2021-10-22 DIAGNOSIS — I10 Essential (primary) hypertension: Secondary | ICD-10-CM | POA: Diagnosis not present

## 2021-10-22 DIAGNOSIS — I2510 Atherosclerotic heart disease of native coronary artery without angina pectoris: Secondary | ICD-10-CM | POA: Diagnosis not present

## 2021-10-22 DIAGNOSIS — D649 Anemia, unspecified: Secondary | ICD-10-CM | POA: Diagnosis not present

## 2021-10-22 DIAGNOSIS — E785 Hyperlipidemia, unspecified: Secondary | ICD-10-CM | POA: Diagnosis not present

## 2021-10-22 DIAGNOSIS — K219 Gastro-esophageal reflux disease without esophagitis: Secondary | ICD-10-CM | POA: Diagnosis not present

## 2021-10-24 ENCOUNTER — Telehealth: Payer: Self-pay | Admitting: Family Medicine

## 2021-10-24 DIAGNOSIS — F419 Anxiety disorder, unspecified: Secondary | ICD-10-CM | POA: Diagnosis not present

## 2021-10-24 DIAGNOSIS — E785 Hyperlipidemia, unspecified: Secondary | ICD-10-CM | POA: Diagnosis not present

## 2021-10-24 DIAGNOSIS — D649 Anemia, unspecified: Secondary | ICD-10-CM | POA: Diagnosis not present

## 2021-10-24 DIAGNOSIS — I2510 Atherosclerotic heart disease of native coronary artery without angina pectoris: Secondary | ICD-10-CM | POA: Diagnosis not present

## 2021-10-24 DIAGNOSIS — I10 Essential (primary) hypertension: Secondary | ICD-10-CM | POA: Diagnosis not present

## 2021-10-24 DIAGNOSIS — K219 Gastro-esophageal reflux disease without esophagitis: Secondary | ICD-10-CM | POA: Diagnosis not present

## 2021-10-24 NOTE — Telephone Encounter (Signed)
Linton Rump from [facility] called and asked could office notes from the last 3 visits be faxed to him at [phone]. Attn:Lucus

## 2021-10-25 ENCOUNTER — Ambulatory Visit (INDEPENDENT_AMBULATORY_CARE_PROVIDER_SITE_OTHER): Payer: Medicare Other

## 2021-10-25 DIAGNOSIS — E538 Deficiency of other specified B group vitamins: Secondary | ICD-10-CM

## 2021-10-25 MED ORDER — CYANOCOBALAMIN 1000 MCG/ML IJ SOLN
1000.0000 ug | Freq: Once | INTRAMUSCULAR | Status: AC
  Administered 2021-10-25: 1000 ug via INTRAMUSCULAR

## 2021-10-25 NOTE — Progress Notes (Signed)
Per orders of Dr. Glori Bickers, injection of monthly B12 1000 mcg/ml given by Pilar Grammes, CMA in Left Deltoid. Pt will set up B12 for next month. Missed multiple injections due to hip replacement.  Patient tolerated injection well.

## 2021-10-28 NOTE — Telephone Encounter (Signed)
Do we need verbal or signed med release from patient?  Please ask Mickel Baas or Amy What is she being seen there for?

## 2021-10-29 DIAGNOSIS — K219 Gastro-esophageal reflux disease without esophagitis: Secondary | ICD-10-CM | POA: Diagnosis not present

## 2021-10-29 DIAGNOSIS — E785 Hyperlipidemia, unspecified: Secondary | ICD-10-CM | POA: Diagnosis not present

## 2021-10-29 DIAGNOSIS — I2510 Atherosclerotic heart disease of native coronary artery without angina pectoris: Secondary | ICD-10-CM | POA: Diagnosis not present

## 2021-10-29 DIAGNOSIS — D649 Anemia, unspecified: Secondary | ICD-10-CM | POA: Diagnosis not present

## 2021-10-29 DIAGNOSIS — F419 Anxiety disorder, unspecified: Secondary | ICD-10-CM | POA: Diagnosis not present

## 2021-10-29 DIAGNOSIS — I10 Essential (primary) hypertension: Secondary | ICD-10-CM | POA: Diagnosis not present

## 2021-10-29 NOTE — Telephone Encounter (Signed)
Doses this patient need to sign for this , I don't see that we sent pt to this office

## 2021-10-31 DIAGNOSIS — E785 Hyperlipidemia, unspecified: Secondary | ICD-10-CM | POA: Diagnosis not present

## 2021-10-31 DIAGNOSIS — I2510 Atherosclerotic heart disease of native coronary artery without angina pectoris: Secondary | ICD-10-CM | POA: Diagnosis not present

## 2021-10-31 DIAGNOSIS — D649 Anemia, unspecified: Secondary | ICD-10-CM | POA: Diagnosis not present

## 2021-10-31 DIAGNOSIS — F419 Anxiety disorder, unspecified: Secondary | ICD-10-CM | POA: Diagnosis not present

## 2021-10-31 DIAGNOSIS — I10 Essential (primary) hypertension: Secondary | ICD-10-CM | POA: Diagnosis not present

## 2021-10-31 DIAGNOSIS — K219 Gastro-esophageal reflux disease without esophagitis: Secondary | ICD-10-CM | POA: Diagnosis not present

## 2021-11-05 DIAGNOSIS — E785 Hyperlipidemia, unspecified: Secondary | ICD-10-CM | POA: Diagnosis not present

## 2021-11-05 DIAGNOSIS — I10 Essential (primary) hypertension: Secondary | ICD-10-CM | POA: Diagnosis not present

## 2021-11-05 DIAGNOSIS — I2510 Atherosclerotic heart disease of native coronary artery without angina pectoris: Secondary | ICD-10-CM | POA: Diagnosis not present

## 2021-11-05 DIAGNOSIS — K219 Gastro-esophageal reflux disease without esophagitis: Secondary | ICD-10-CM | POA: Diagnosis not present

## 2021-11-05 DIAGNOSIS — D649 Anemia, unspecified: Secondary | ICD-10-CM | POA: Diagnosis not present

## 2021-11-05 DIAGNOSIS — F419 Anxiety disorder, unspecified: Secondary | ICD-10-CM | POA: Diagnosis not present

## 2021-11-08 DIAGNOSIS — E785 Hyperlipidemia, unspecified: Secondary | ICD-10-CM | POA: Diagnosis not present

## 2021-11-08 DIAGNOSIS — I2510 Atherosclerotic heart disease of native coronary artery without angina pectoris: Secondary | ICD-10-CM | POA: Diagnosis not present

## 2021-11-08 DIAGNOSIS — D649 Anemia, unspecified: Secondary | ICD-10-CM | POA: Diagnosis not present

## 2021-11-08 DIAGNOSIS — F419 Anxiety disorder, unspecified: Secondary | ICD-10-CM | POA: Diagnosis not present

## 2021-11-08 DIAGNOSIS — K219 Gastro-esophageal reflux disease without esophagitis: Secondary | ICD-10-CM | POA: Diagnosis not present

## 2021-11-08 DIAGNOSIS — I10 Essential (primary) hypertension: Secondary | ICD-10-CM | POA: Diagnosis not present

## 2021-11-09 DIAGNOSIS — H02055 Trichiasis without entropian left lower eyelid: Secondary | ICD-10-CM | POA: Diagnosis not present

## 2021-11-19 ENCOUNTER — Encounter: Payer: Self-pay | Admitting: Family Medicine

## 2021-11-19 ENCOUNTER — Ambulatory Visit (INDEPENDENT_AMBULATORY_CARE_PROVIDER_SITE_OTHER): Payer: Medicare Other | Admitting: Family Medicine

## 2021-11-19 VITALS — BP 138/61 | HR 65 | Temp 98.2°F | Ht 61.0 in | Wt 125.2 lb

## 2021-11-19 DIAGNOSIS — M810 Age-related osteoporosis without current pathological fracture: Secondary | ICD-10-CM | POA: Diagnosis not present

## 2021-11-19 DIAGNOSIS — I25118 Atherosclerotic heart disease of native coronary artery with other forms of angina pectoris: Secondary | ICD-10-CM | POA: Diagnosis not present

## 2021-11-19 DIAGNOSIS — R2689 Other abnormalities of gait and mobility: Secondary | ICD-10-CM

## 2021-11-19 DIAGNOSIS — Z8781 Personal history of (healed) traumatic fracture: Secondary | ICD-10-CM

## 2021-11-19 DIAGNOSIS — Z23 Encounter for immunization: Secondary | ICD-10-CM | POA: Diagnosis not present

## 2021-11-19 DIAGNOSIS — E538 Deficiency of other specified B group vitamins: Secondary | ICD-10-CM

## 2021-11-19 DIAGNOSIS — I10 Essential (primary) hypertension: Secondary | ICD-10-CM

## 2021-11-19 MED ORDER — CYANOCOBALAMIN 1000 MCG/ML IJ SOLN
1000.0000 ug | Freq: Once | INTRAMUSCULAR | Status: AC
  Administered 2021-11-19: 1000 ug via INTRAMUSCULAR

## 2021-11-19 MED ORDER — OMEPRAZOLE 20 MG PO CPDR: DELAYED_RELEASE_CAPSULE | ORAL | 3 refills | Status: DC

## 2021-11-19 NOTE — Progress Notes (Signed)
Subjective:    Patient ID: Jenna Young, female    DOB: 19-Oct-1931, 88 y.o.   MRN: [id number]  HPI Pt presents for f/u of a fall in April Also flu shot and B12 shot  Wt Readings from Last 3 Encounters:  11/19/21 125 lb 4 oz (56.8 kg)  06/27/21 138 lb 0.1 oz (62.6 kg)  05/18/21 138 lb (62.6 kg)   23.67 kg/m   Came back home from [place] with family    She tripped on sidewalk and fell / fractured L hip in April Tipped side walk - otherwise she would not have fallen  Had surgery -went well    Good recovery  Hip feels great - did not have much pain   No more falls  Was in PT and OT in [place] and had nursing come twice weekly  Now uses only a cane  Has taken up all the rugs  Has a rail by kitchen door (3 steps) Working on rails at the other 2  Did put in raised commode Also bedside commode  Also rails   Has her exercise program to do at home  Would like to do more PT (not driving yet)     Noted poor balance in the past Has had spinal comp fx in the past as well as a hip fracture  Was ref to PT in march  D level nl in march   Last b12 shot was 8/17 Wants to do hers a wk early while she is here  Also flu shot    HTN bp is stable today  No cp or palpitations or headaches or edema  No side effects to medicines  BP Readings from Last 3 Encounters:  11/19/21 138/61  07/02/21 (!) 130/59  05/18/21 135/70    Losartan 100 mg daily  Coreg 3.125 mg bid Isosorbide 30 mg daily -now off of it since falling   Cardiology was interested in getting a monitor -pt declined   Had a large pill in nursing home get stuck in her throat She is afraid of pills now  Esp big ones   No problems swallowing food or liquids    Patient Active Problem List   Diagnosis Date Noted   History of hip fracture 11/19/2021   Poor balance 05/18/2021   Great toe pain, right 05/18/2021   Urinary frequency 03/15/2021   Intertrigo labialis 03/15/2021    Frequent urination 08/24/2020   History of COVID-19 08/03/2020   Dermatitis 05/03/2020   Secondary hypertension    Iron deficiency anemia 05/12/2019   Prediabetes 04/15/2018   Encounter for screening mammogram for breast cancer 04/01/2017   H/O syncope 02/28/2017   Coronary artery disease of native artery of native heart with stable angina pectoris ([place]) 02/21/2017   History of non-ST elevation myocardial infarction (NSTEMI) 02/10/2017   History of breast cancer 02/26/2016   Varicose vein of leg 10/09/2015   Fatigue 10/09/2015   Pedal edema 10/09/2015   Heart murmur 10/09/2015   History of spinal fracture 05/07/2015   Dysthymia 09/20/2013   Encounter for Medicare annual wellness exam 02/03/2013   Vitamin D deficiency 08/19/2008   ANXIETY 01/29/2008   Essential hypertension, benign 09/25/2007   B12 deficiency 09/24/2006   Hyperlipidemia 09/24/2006   Venous (peripheral) insufficiency 09/24/2006   ALLERGIC RHINITIS 09/24/2006   GERD 09/24/2006   Osteoporosis 09/24/2006   Past Medical History:  Diagnosis Date   Allergic rhinitis    Anemia    Anxiety  GERD (gastroesophageal reflux disease)    History of radiation therapy 04/29/16- 05/24/16   Left Breast 40.05 Gy in 15 fractions, Left Breast boost 10 Gy in 5 fractions.    Hyperlipidemia    Hypertension    treated by Dr Thurnell Garbe recently with syncope episode   Osteopenia    Osteoporosis    Personal history of radiation therapy 2018   Past Surgical History:  Procedure Laterality Date   APPENDECTOMY     BREAST BIOPSY Left 11/29/2008   BREAST BIOPSY Left 01/26/2016   malignant   BREAST EXCISIONAL BIOPSY Left 12/26/2008   BREAST LUMPECTOMY Left 02/27/2016   BREAST LUMPECTOMY Left 04/01/2016   BREAST LUMPECTOMY WITH RADIOACTIVE SEED LOCALIZATION Left 02/27/2016   Procedure: LEFT BREAST LUMPECTOMY WITH RADIOACTIVE SEED LOCALIZATION;  Surgeon: Autumn Messing III, MD;  Location: [facility];  Service: General;   Laterality: Left;   BREAST SURGERY  10/10   benign biopsy  negative   CESAREAN SECTION     CHOLECYSTECTOMY     CORONARY STENT INTERVENTION N/A 02/11/2017   Procedure: CORONARY STENT INTERVENTION;  Surgeon: Nelva Bush, MD;  Location: [facility];  Service: Cardiovascular;  Laterality: N/A;   FEMUR FRACTURE SURGERY Right 2006   HIP ARTHROPLASTY Left 06/28/2021   Procedure: ARTHROPLASTY BIPOLAR HIP (HEMIARTHROPLASTY);  Surgeon: Renee Harder, MD;  Location: [facility];  Service: Orthopedics;  Laterality: Left;   LEFT HEART CATH AND CORONARY ANGIOGRAPHY N/A 02/11/2017   Procedure: LEFT HEART CATH AND CORONARY ANGIOGRAPHY;  Surgeon: Nelva Bush, MD;  Location: [facility];  Service: Cardiovascular;  Laterality: N/A;   LEFT HEART CATH AND CORONARY ANGIOGRAPHY N/A 05/21/2019   Procedure: LEFT HEART CATH AND CORONARY ANGIOGRAPHY;  Surgeon: Wellington Hampshire, MD;  Location: [facility];  Service: Cardiovascular;  Laterality: N/A;   OVARIAN CYST SURGERY     RE-EXCISION OF BREAST LUMPECTOMY Left 04/01/2016   Procedure: RE-EXCISION OF LEFT BREAST MEDIAL MARGIN;  Surgeon: Autumn Messing III, MD;  Location: [facility];  Service: General;  Laterality: Left;   Social History   Tobacco Use   Smoking status: Former    Types: Cigarettes    Quit date: 03/11/1968    Years since quitting: 53.7   Smokeless tobacco: Never  Vaping Use   Vaping Use: Never used  Substance Use Topics   Alcohol use: No    Alcohol/week: 0.0 standard drinks of alcohol   Drug use: No   Family History  Problem Relation Age of Onset   Hypertension Mother    Heart disease Father    Macular degeneration Sister    Diabetes Maternal Aunt    Cancer Maternal Aunt        throat   Allergies  Allergen Reactions   Buspirone Hcl     REACTION: HEART PALPITATIONS   Codeine     REACTION: nausea and vomiting   Hydrochlorothiazide     REACTION: syncope-possibly from dehydration   Iron      Oral iron -per pt made her "pass out"    Lansoprazole     REACTION: abd pain   Minocycline Hcl    Oxycodone Other (See Comments)    Hallucinations    Penicillins     REACTION: mouth numbness Has patient had a PCN reaction causing immediate rash, facial/tongue/throat swelling, SOB or lightheadedness with hypotension: No Has patient had a PCN reaction causing severe rash involving mucus membranes or skin necrosis: No Has patient had a PCN reaction  that required hospitalization: No Has patient had a PCN reaction occurring within the last 10 years: No If all of the above answers are "NO", then may proceed with Cephalosporin use.   Phenergan [Promethazine Hcl] Other (See Comments)    Body shakes, hallucinations   Ramipril     Cough    Ranexa [Ranolazine]     Constipation    Sulfa Antibiotics Nausea Only   Rosuvastatin     Patient reports muscle pain, unable to take. Stopped around 07/22.   Current Outpatient Medications on File Prior to Visit  Medication Sig Dispense Refill   carvedilol (COREG) 3.125 MG tablet TAKE 1 TABLET(3.125 MG) BY MOUTH TWICE DAILY 180 tablet 2   Cholecalciferol (VITAMIN D) 2000 UNITS CAPS Take 1 capsule by mouth daily.     clopidogrel (PLAVIX) 75 MG tablet TAKE 1 TABLET(75 MG) BY MOUTH DAILY WITH BREAKFAST 90 tablet 3   cyanocobalamin (,VITAMIN B-12,) 1000 MCG/ML injection Inject 1,000 mcg into the muscle every 30 (thirty) days.     docusate sodium (COLACE) 100 MG capsule Take 2 capsules (200 mg total) by mouth 2 (two) times daily. 10 capsule 0   losartan (COZAAR) 100 MG tablet TAKE 1 TABLET(100 MG) BY MOUTH DAILY 90 tablet 2   Multiple Vitamin (MULTIVITAMIN WITH MINERALS) TABS tablet Take 1 tablet by mouth daily. 30 tablet 1   nitroGLYCERIN (NITROSTAT) 0.4 MG SL tablet PLACE 1 TABLET UNDER TONGUE EVERY 5 MIN AS NEEDED FOR CHEST PAIN IF NO RELIEF IN15 MIN CALL 911 (MAX 3 TABS) 25 tablet 1   rosuvastatin (CRESTOR) 10 MG tablet TAKE 1 TABLET(10 MG) BY MOUTH DAILY 90  tablet 2   Skin Protectants, Misc. (EUCERIN) cream Apply 1 application. topically as needed for dry skin.     No current facility-administered medications on file prior to visit.    Review of Systems  Constitutional:  Negative for activity change, appetite change, fatigue, fever and unexpected weight change.  HENT:  Negative for congestion, ear pain, rhinorrhea, sinus pressure and sore throat.   Eyes:  Negative for pain, redness and visual disturbance.  Respiratory:  Negative for cough, shortness of breath and wheezing.   Cardiovascular:  Negative for chest pain and palpitations.  Gastrointestinal:  Negative for abdominal pain, blood in stool, constipation and diarrhea.  Endocrine: Negative for polydipsia and polyuria.  Genitourinary:  Negative for dysuria, frequency and urgency.  Musculoskeletal:  Positive for arthralgias. Negative for back pain and myalgias.  Skin:  Negative for pallor and rash.  Allergic/Immunologic: Negative for environmental allergies.  Neurological:  Negative for dizziness, syncope and headaches.       Poor balance Not dizzy  Hematological:  Negative for adenopathy. Does not bruise/bleed easily.  Psychiatric/Behavioral:  Negative for decreased concentration and dysphoric mood. The patient is not nervous/anxious.        Objective:   Physical Exam Constitutional:      General: She is not in acute distress.    Appearance: Normal appearance. She is well-developed and normal weight. She is not ill-appearing or diaphoretic.  HENT:     Head: Normocephalic and atraumatic.     Mouth/Throat:     Mouth: Mucous membranes are moist.  Eyes:     General: No scleral icterus.    Conjunctiva/sclera: Conjunctivae normal.     Pupils: Pupils are equal, round, and reactive to light.  Neck:     Thyroid: No thyromegaly.     Vascular: No carotid bruit or JVD.  Cardiovascular:  Rate and Rhythm: Normal rate and regular rhythm.     Heart sounds: Normal heart sounds.     No  gallop.  Pulmonary:     Effort: Pulmonary effort is normal. No respiratory distress.     Breath sounds: Normal breath sounds. No stridor. No wheezing, rhonchi or rales.  Abdominal:     General: There is no distension or abdominal bruit.     Palpations: Abdomen is soft. There is no mass.     Tenderness: There is no abdominal tenderness.  Musculoskeletal:     Cervical back: Normal range of motion and neck supple. No tenderness.     Right lower leg: No edema.     Left lower leg: No edema.     Comments: Limited rom of hips   Lymphadenopathy:     Cervical: No cervical adenopathy.  Skin:    General: Skin is warm and dry.     Coloration: Skin is not pale.     Findings: No rash.  Neurological:     Mental Status: She is alert.     Coordination: Coordination normal.     Deep Tendon Reflexes: Reflexes are normal and symmetric. Reflexes normal.     Comments: Slow gait with cane   Some generalized weakness (not focal)   Psychiatric:        Mood and Affect: Mood normal.           Assessment & Plan:   Problem List Items Addressed This Visit       Cardiovascular and Mediastinum   Essential hypertension, benign    bp in fair control at this time  BP Readings from Last 1 Encounters:  11/19/21 138/61  No changes needed Most recent labs reviewed  Disc lifstyle change with low sodium diet and exercise  Off imdur due to hypotension and syncope (no problems since stopping it)  Plan to continue Losartan 100 mg daily  Cored 3.125 mg bid   Lab ordered Had low na in hospital       Relevant Orders   Basic metabolic panel     Musculoskeletal and Integument   Osteoporosis    Has now fx both spine (comp fx) and hip Pt still declines dexa or treatment  Disc this with her in detail and she is aware her fx risk is high but still declines inst to call if she changes her mind  Continue supplements  PT and exercise  Fall prevention discussed in detail          Other   B12  deficiency    B12 shot today (due later in week but wanted to save a trip) Lab Results  Component Value Date   VITAMINB12 553 05/11/2021         History of hip fracture    Reviewed hosp and nsg home info  Now home from [place] and doing very well  Requests HH for PT   Labs done today Pt declines dexa or any tx for OP  Disc fall prev in detail       Relevant Orders   Ambulatory referral to Home Health   Poor balance - Primary    Has done PT Now using cane but has walker handy if needed   No falls since hip fx in April  Disc fall prev in detail   Ref for [facility] PT noted       Relevant Orders   Ambulatory referral to [facility]   Other Visit Diagnoses  Need for influenza vaccination       Relevant Orders   Flu Vaccine QUAD High Dose(Fluad) (Completed)

## 2021-11-19 NOTE — Patient Instructions (Addendum)
Have a walker in the house in case you need it   Take care of yourself   I will place a home health referral for PT   Think about a bone density test since you have broken bones When you are ready let us know  There are medicines to help prevent broken bones if you would be open to it   Verify what pill the "pink" pill you have trouble swallowing is  There may be a liquid option but I need to know what pill it is  Call or email [place]   B12 shot today  Flu shot today   Lab today for chemistries

## 2021-11-19 NOTE — Assessment & Plan Note (Signed)
Reviewed hosp and nsg home info  Now home from [place] and doing very well  Requests HH for PT   Labs done today Pt declines dexa or any tx for OP  Disc fall prev in detail

## 2021-11-19 NOTE — Assessment & Plan Note (Signed)
Has now fx both spine (comp fx) and hip Pt still declines dexa or treatment  Disc this with her in detail and she is aware her fx risk is high but still declines inst to call if she changes her mind  Continue supplements  PT and exercise  Fall prevention discussed in detail

## 2021-11-19 NOTE — Assessment & Plan Note (Signed)
bp in fair control at this time  BP Readings from Last 1 Encounters:  11/19/21 138/61   No changes needed Most recent labs reviewed  Disc lifstyle change with low sodium diet and exercise  Off imdur due to hypotension and syncope (no problems since stopping it)  Plan to continue Losartan 100 mg daily  Cored 3.125 mg bid   Lab ordered Had low na in hospital

## 2021-11-19 NOTE — Assessment & Plan Note (Signed)
B12 shot today (due later in week but wanted to save a trip) Lab Results  Component Value Date   VITAMINB12 553 05/11/2021

## 2021-11-19 NOTE — Assessment & Plan Note (Signed)
Has done PT Now using cane but has walker handy if needed   No falls since hip fx in April  Disc fall prev in detail   Ref for [facility] PT noted

## 2021-11-20 LAB — BASIC METABOLIC PANEL
BUN: 8 mg/dL (ref 6–23)
CO2: 27 mEq/L (ref 19–32)
Calcium: 8.9 mg/dL (ref 8.4–10.5)
Chloride: 103 mEq/L (ref 96–112)
Creatinine, Ser: 0.89 mg/dL (ref 0.40–1.20)
GFR: 58.11 mL/min — ABNORMAL LOW (ref >60.00)
Glucose, Bld: 82 mg/dL (ref 70–99)
Potassium: 4.3 mEq/L (ref 3.5–5.1)
Sodium: 137 mEq/L (ref 135–145)

## 2021-11-21 ENCOUNTER — Telehealth: Payer: Self-pay | Admitting: *Deleted

## 2021-11-21 DIAGNOSIS — Z961 Presence of intraocular lens: Secondary | ICD-10-CM | POA: Diagnosis not present

## 2021-11-21 DIAGNOSIS — Z87310 Personal history of (healed) osteoporosis fracture: Secondary | ICD-10-CM | POA: Diagnosis not present

## 2021-11-21 DIAGNOSIS — Z87891 Personal history of nicotine dependence: Secondary | ICD-10-CM | POA: Diagnosis not present

## 2021-11-21 DIAGNOSIS — I159 Secondary hypertension, unspecified: Secondary | ICD-10-CM | POA: Diagnosis not present

## 2021-11-21 DIAGNOSIS — H02055 Trichiasis without entropian left lower eyelid: Secondary | ICD-10-CM | POA: Diagnosis not present

## 2021-11-21 DIAGNOSIS — Z9181 History of falling: Secondary | ICD-10-CM | POA: Diagnosis not present

## 2021-11-21 DIAGNOSIS — Z7902 Long term (current) use of antithrombotics/antiplatelets: Secondary | ICD-10-CM | POA: Diagnosis not present

## 2021-11-21 DIAGNOSIS — I10 Essential (primary) hypertension: Secondary | ICD-10-CM | POA: Diagnosis not present

## 2021-11-21 DIAGNOSIS — I25118 Atherosclerotic heart disease of native coronary artery with other forms of angina pectoris: Secondary | ICD-10-CM | POA: Diagnosis not present

## 2021-11-21 DIAGNOSIS — H524 Presbyopia: Secondary | ICD-10-CM | POA: Diagnosis not present

## 2021-11-21 DIAGNOSIS — I872 Venous insufficiency (chronic) (peripheral): Secondary | ICD-10-CM | POA: Diagnosis not present

## 2021-11-21 DIAGNOSIS — Z8616 Personal history of COVID-19: Secondary | ICD-10-CM | POA: Diagnosis not present

## 2021-11-21 DIAGNOSIS — E538 Deficiency of other specified B group vitamins: Secondary | ICD-10-CM | POA: Diagnosis not present

## 2021-11-21 DIAGNOSIS — H43813 Vitreous degeneration, bilateral: Secondary | ICD-10-CM | POA: Diagnosis not present

## 2021-11-21 DIAGNOSIS — D509 Iron deficiency anemia, unspecified: Secondary | ICD-10-CM | POA: Diagnosis not present

## 2021-11-21 DIAGNOSIS — M810 Age-related osteoporosis without current pathological fracture: Secondary | ICD-10-CM | POA: Diagnosis not present

## 2021-11-21 DIAGNOSIS — R7303 Prediabetes: Secondary | ICD-10-CM | POA: Diagnosis not present

## 2021-11-21 DIAGNOSIS — H5203 Hypermetropia, bilateral: Secondary | ICD-10-CM | POA: Diagnosis not present

## 2021-11-21 DIAGNOSIS — F341 Dysthymic disorder: Secondary | ICD-10-CM | POA: Diagnosis not present

## 2021-11-21 DIAGNOSIS — F419 Anxiety disorder, unspecified: Secondary | ICD-10-CM | POA: Diagnosis not present

## 2021-11-21 DIAGNOSIS — H52223 Regular astigmatism, bilateral: Secondary | ICD-10-CM | POA: Diagnosis not present

## 2021-11-21 NOTE — Telephone Encounter (Signed)
Left VM requesting pt to call the office back 

## 2021-11-21 NOTE — Telephone Encounter (Signed)
-----   Message from Abner Greenspan, MD sent at 11/20/2021  6:34 PM EDT ----- Your labs look ok and stable  I checked with our pharmacist and there is no liquid version of plavix available currently  Because you have trouble swallowing that pill, she said you could crush it and mix it with something to take more easily

## 2021-11-22 ENCOUNTER — Other Ambulatory Visit: Payer: Self-pay | Admitting: Family Medicine

## 2021-11-22 ENCOUNTER — Telehealth: Payer: Self-pay | Admitting: Family Medicine

## 2021-11-22 DIAGNOSIS — Z1231 Encounter for screening mammogram for malignant neoplasm of breast: Secondary | ICD-10-CM

## 2021-11-22 NOTE — Telephone Encounter (Signed)
Addressed through result notes  

## 2021-11-22 NOTE — Telephone Encounter (Signed)
  Encourage patient to contact the pharmacy for refills or they can request refills through [facility]  Did the patient contact the pharmacy: No   LAST APPOINTMENT DATE: 11/19/2021  NEXT APPOINTMENT DATE: N/A  MEDICATION: clopidogrel (PLAVIX) 75 MG tablet: pink pill not the white one  Is the patient out of medication? No  If not, how much is left? 1 left  PHARMACY: [id number] - [place], [place]  Let patient know to contact pharmacy at the end of the day to make sure medication is ready.  Please notify patient to allow 48-72 hours to process

## 2021-11-23 MED ORDER — CLOPIDOGREL BISULFATE 75 MG PO TABS: ORAL_TABLET | ORAL | 3 refills | Status: DC

## 2021-11-23 NOTE — Telephone Encounter (Signed)
I sent it  If they cannot give her the generic she prefers she can crush the pill Thanks

## 2021-11-23 NOTE — Telephone Encounter (Signed)
Pt aware.

## 2021-11-23 NOTE — Addendum Note (Signed)
Addended by: Loura Pardon A on: 11/23/2021 09:42 AM   Modules accepted: Orders

## 2021-11-26 ENCOUNTER — Telehealth: Payer: Self-pay | Admitting: Family Medicine

## 2021-11-26 NOTE — Telephone Encounter (Signed)
Verbal order given  

## 2021-11-26 NOTE — Telephone Encounter (Signed)
Please ok those verbal orders  

## 2021-11-26 NOTE — Telephone Encounter (Signed)
Home Health verbal orders Caller Name: [place] Name: Myrtie Cruise number: [phone]  Requesting PT  Frequency: 1x for 1week, 2x for 2 weeks, and 1x for 3 weeks  Please forward to [facility] pool or providers CMA

## 2021-11-27 ENCOUNTER — Ambulatory Visit: Payer: Medicare Other

## 2021-11-27 DIAGNOSIS — I25118 Atherosclerotic heart disease of native coronary artery with other forms of angina pectoris: Secondary | ICD-10-CM | POA: Diagnosis not present

## 2021-11-27 DIAGNOSIS — M810 Age-related osteoporosis without current pathological fracture: Secondary | ICD-10-CM | POA: Diagnosis not present

## 2021-11-27 DIAGNOSIS — I159 Secondary hypertension, unspecified: Secondary | ICD-10-CM | POA: Diagnosis not present

## 2021-11-27 DIAGNOSIS — I872 Venous insufficiency (chronic) (peripheral): Secondary | ICD-10-CM | POA: Diagnosis not present

## 2021-11-27 DIAGNOSIS — I10 Essential (primary) hypertension: Secondary | ICD-10-CM | POA: Diagnosis not present

## 2021-11-27 DIAGNOSIS — R7303 Prediabetes: Secondary | ICD-10-CM | POA: Diagnosis not present

## 2021-11-29 DIAGNOSIS — I25118 Atherosclerotic heart disease of native coronary artery with other forms of angina pectoris: Secondary | ICD-10-CM | POA: Diagnosis not present

## 2021-11-29 DIAGNOSIS — I872 Venous insufficiency (chronic) (peripheral): Secondary | ICD-10-CM | POA: Diagnosis not present

## 2021-11-29 DIAGNOSIS — I159 Secondary hypertension, unspecified: Secondary | ICD-10-CM | POA: Diagnosis not present

## 2021-11-29 DIAGNOSIS — R7303 Prediabetes: Secondary | ICD-10-CM | POA: Diagnosis not present

## 2021-11-29 DIAGNOSIS — M810 Age-related osteoporosis without current pathological fracture: Secondary | ICD-10-CM | POA: Diagnosis not present

## 2021-11-29 DIAGNOSIS — I10 Essential (primary) hypertension: Secondary | ICD-10-CM | POA: Diagnosis not present

## 2021-12-04 DIAGNOSIS — I10 Essential (primary) hypertension: Secondary | ICD-10-CM | POA: Diagnosis not present

## 2021-12-04 DIAGNOSIS — I872 Venous insufficiency (chronic) (peripheral): Secondary | ICD-10-CM | POA: Diagnosis not present

## 2021-12-04 DIAGNOSIS — I159 Secondary hypertension, unspecified: Secondary | ICD-10-CM | POA: Diagnosis not present

## 2021-12-04 DIAGNOSIS — I25118 Atherosclerotic heart disease of native coronary artery with other forms of angina pectoris: Secondary | ICD-10-CM | POA: Diagnosis not present

## 2021-12-04 DIAGNOSIS — M810 Age-related osteoporosis without current pathological fracture: Secondary | ICD-10-CM | POA: Diagnosis not present

## 2021-12-04 DIAGNOSIS — R7303 Prediabetes: Secondary | ICD-10-CM | POA: Diagnosis not present

## 2021-12-06 DIAGNOSIS — I10 Essential (primary) hypertension: Secondary | ICD-10-CM | POA: Diagnosis not present

## 2021-12-06 DIAGNOSIS — I159 Secondary hypertension, unspecified: Secondary | ICD-10-CM | POA: Diagnosis not present

## 2021-12-06 DIAGNOSIS — M810 Age-related osteoporosis without current pathological fracture: Secondary | ICD-10-CM | POA: Diagnosis not present

## 2021-12-06 DIAGNOSIS — I872 Venous insufficiency (chronic) (peripheral): Secondary | ICD-10-CM | POA: Diagnosis not present

## 2021-12-06 DIAGNOSIS — R7303 Prediabetes: Secondary | ICD-10-CM | POA: Diagnosis not present

## 2021-12-06 DIAGNOSIS — I25118 Atherosclerotic heart disease of native coronary artery with other forms of angina pectoris: Secondary | ICD-10-CM | POA: Diagnosis not present

## 2021-12-11 ENCOUNTER — Encounter: Payer: Self-pay | Admitting: Orthopaedic Surgery

## 2021-12-11 DIAGNOSIS — D509 Iron deficiency anemia, unspecified: Secondary | ICD-10-CM

## 2021-12-11 DIAGNOSIS — Z7902 Long term (current) use of antithrombotics/antiplatelets: Secondary | ICD-10-CM

## 2021-12-11 DIAGNOSIS — R7303 Prediabetes: Secondary | ICD-10-CM

## 2021-12-11 DIAGNOSIS — I872 Venous insufficiency (chronic) (peripheral): Secondary | ICD-10-CM

## 2021-12-11 DIAGNOSIS — I25118 Atherosclerotic heart disease of native coronary artery with other forms of angina pectoris: Secondary | ICD-10-CM

## 2021-12-11 DIAGNOSIS — M810 Age-related osteoporosis without current pathological fracture: Secondary | ICD-10-CM

## 2021-12-11 DIAGNOSIS — I10 Essential (primary) hypertension: Secondary | ICD-10-CM

## 2021-12-11 DIAGNOSIS — F419 Anxiety disorder, unspecified: Secondary | ICD-10-CM

## 2021-12-11 DIAGNOSIS — Z8616 Personal history of COVID-19: Secondary | ICD-10-CM

## 2021-12-11 DIAGNOSIS — F341 Dysthymic disorder: Secondary | ICD-10-CM

## 2021-12-11 DIAGNOSIS — Z87310 Personal history of (healed) osteoporosis fracture: Secondary | ICD-10-CM

## 2021-12-11 DIAGNOSIS — I159 Secondary hypertension, unspecified: Secondary | ICD-10-CM

## 2021-12-11 DIAGNOSIS — E538 Deficiency of other specified B group vitamins: Secondary | ICD-10-CM

## 2021-12-11 DIAGNOSIS — Z9181 History of falling: Secondary | ICD-10-CM

## 2021-12-11 DIAGNOSIS — Z87891 Personal history of nicotine dependence: Secondary | ICD-10-CM

## 2021-12-12 ENCOUNTER — Ambulatory Visit: Payer: Medicare Other

## 2021-12-12 DIAGNOSIS — R7303 Prediabetes: Secondary | ICD-10-CM | POA: Diagnosis not present

## 2021-12-12 DIAGNOSIS — I872 Venous insufficiency (chronic) (peripheral): Secondary | ICD-10-CM | POA: Diagnosis not present

## 2021-12-12 DIAGNOSIS — I25118 Atherosclerotic heart disease of native coronary artery with other forms of angina pectoris: Secondary | ICD-10-CM | POA: Diagnosis not present

## 2021-12-12 DIAGNOSIS — M810 Age-related osteoporosis without current pathological fracture: Secondary | ICD-10-CM | POA: Diagnosis not present

## 2021-12-12 DIAGNOSIS — I159 Secondary hypertension, unspecified: Secondary | ICD-10-CM | POA: Diagnosis not present

## 2021-12-12 DIAGNOSIS — I10 Essential (primary) hypertension: Secondary | ICD-10-CM | POA: Diagnosis not present

## 2021-12-18 ENCOUNTER — Telehealth: Payer: Self-pay | Admitting: Family Medicine

## 2021-12-18 DIAGNOSIS — R7303 Prediabetes: Secondary | ICD-10-CM | POA: Diagnosis not present

## 2021-12-18 DIAGNOSIS — I25118 Atherosclerotic heart disease of native coronary artery with other forms of angina pectoris: Secondary | ICD-10-CM | POA: Diagnosis not present

## 2021-12-18 DIAGNOSIS — I872 Venous insufficiency (chronic) (peripheral): Secondary | ICD-10-CM | POA: Diagnosis not present

## 2021-12-18 DIAGNOSIS — I10 Essential (primary) hypertension: Secondary | ICD-10-CM | POA: Diagnosis not present

## 2021-12-18 DIAGNOSIS — I159 Secondary hypertension, unspecified: Secondary | ICD-10-CM | POA: Diagnosis not present

## 2021-12-18 DIAGNOSIS — M810 Age-related osteoporosis without current pathological fracture: Secondary | ICD-10-CM | POA: Diagnosis not present

## 2021-12-18 NOTE — Telephone Encounter (Signed)
VO given to [facility]

## 2021-12-18 NOTE — Telephone Encounter (Signed)
Home Health verbal orders Caller Name: [facility]  Agency Name: Tawny Asal number: [phone]  Requesting OT/PT/Skilled nursing/Social Work/Speech: PT  Reason:  Frequency: one week x 3   Please forward to [facility] pool or providers CMA

## 2021-12-18 NOTE — Telephone Encounter (Signed)
Please ok those verbal orders  

## 2021-12-21 DIAGNOSIS — Z8616 Personal history of COVID-19: Secondary | ICD-10-CM | POA: Diagnosis not present

## 2021-12-21 DIAGNOSIS — M810 Age-related osteoporosis without current pathological fracture: Secondary | ICD-10-CM | POA: Diagnosis not present

## 2021-12-21 DIAGNOSIS — E538 Deficiency of other specified B group vitamins: Secondary | ICD-10-CM | POA: Diagnosis not present

## 2021-12-21 DIAGNOSIS — D509 Iron deficiency anemia, unspecified: Secondary | ICD-10-CM | POA: Diagnosis not present

## 2021-12-21 DIAGNOSIS — F341 Dysthymic disorder: Secondary | ICD-10-CM | POA: Diagnosis not present

## 2021-12-21 DIAGNOSIS — Z87891 Personal history of nicotine dependence: Secondary | ICD-10-CM | POA: Diagnosis not present

## 2021-12-21 DIAGNOSIS — Z9181 History of falling: Secondary | ICD-10-CM | POA: Diagnosis not present

## 2021-12-21 DIAGNOSIS — Z87310 Personal history of (healed) osteoporosis fracture: Secondary | ICD-10-CM | POA: Diagnosis not present

## 2021-12-21 DIAGNOSIS — I159 Secondary hypertension, unspecified: Secondary | ICD-10-CM | POA: Diagnosis not present

## 2021-12-21 DIAGNOSIS — I10 Essential (primary) hypertension: Secondary | ICD-10-CM | POA: Diagnosis not present

## 2021-12-21 DIAGNOSIS — F419 Anxiety disorder, unspecified: Secondary | ICD-10-CM | POA: Diagnosis not present

## 2021-12-21 DIAGNOSIS — I25118 Atherosclerotic heart disease of native coronary artery with other forms of angina pectoris: Secondary | ICD-10-CM | POA: Diagnosis not present

## 2021-12-21 DIAGNOSIS — I872 Venous insufficiency (chronic) (peripheral): Secondary | ICD-10-CM | POA: Diagnosis not present

## 2021-12-21 DIAGNOSIS — R7303 Prediabetes: Secondary | ICD-10-CM | POA: Diagnosis not present

## 2021-12-21 DIAGNOSIS — Z7902 Long term (current) use of antithrombotics/antiplatelets: Secondary | ICD-10-CM | POA: Diagnosis not present

## 2021-12-25 DIAGNOSIS — I872 Venous insufficiency (chronic) (peripheral): Secondary | ICD-10-CM | POA: Diagnosis not present

## 2021-12-25 DIAGNOSIS — I10 Essential (primary) hypertension: Secondary | ICD-10-CM | POA: Diagnosis not present

## 2021-12-25 DIAGNOSIS — I25118 Atherosclerotic heart disease of native coronary artery with other forms of angina pectoris: Secondary | ICD-10-CM | POA: Diagnosis not present

## 2021-12-25 DIAGNOSIS — R7303 Prediabetes: Secondary | ICD-10-CM | POA: Diagnosis not present

## 2021-12-25 DIAGNOSIS — M810 Age-related osteoporosis without current pathological fracture: Secondary | ICD-10-CM | POA: Diagnosis not present

## 2021-12-25 DIAGNOSIS — I159 Secondary hypertension, unspecified: Secondary | ICD-10-CM | POA: Diagnosis not present

## 2021-12-28 ENCOUNTER — Ambulatory Visit (INDEPENDENT_AMBULATORY_CARE_PROVIDER_SITE_OTHER): Payer: Medicare Other

## 2021-12-28 DIAGNOSIS — E538 Deficiency of other specified B group vitamins: Secondary | ICD-10-CM

## 2021-12-28 MED ORDER — CYANOCOBALAMIN 1000 MCG/ML IJ SOLN
1000.0000 ug | Freq: Once | INTRAMUSCULAR | Status: AC
  Administered 2021-12-28: 1000 ug via INTRAMUSCULAR

## 2021-12-28 NOTE — Progress Notes (Signed)
Per orders of Dr. Glori Bickers, injection of B12 monthly given by Kris Mouton. Patient tolerated injection well.

## 2021-12-31 DIAGNOSIS — I159 Secondary hypertension, unspecified: Secondary | ICD-10-CM | POA: Diagnosis not present

## 2021-12-31 DIAGNOSIS — R7303 Prediabetes: Secondary | ICD-10-CM | POA: Diagnosis not present

## 2021-12-31 DIAGNOSIS — I25118 Atherosclerotic heart disease of native coronary artery with other forms of angina pectoris: Secondary | ICD-10-CM | POA: Diagnosis not present

## 2021-12-31 DIAGNOSIS — I10 Essential (primary) hypertension: Secondary | ICD-10-CM | POA: Diagnosis not present

## 2021-12-31 DIAGNOSIS — M810 Age-related osteoporosis without current pathological fracture: Secondary | ICD-10-CM | POA: Diagnosis not present

## 2021-12-31 DIAGNOSIS — I872 Venous insufficiency (chronic) (peripheral): Secondary | ICD-10-CM | POA: Diagnosis not present

## 2022-01-04 ENCOUNTER — Inpatient Hospital Stay: Admission: RE | Admit: 2022-01-04 | Payer: Medicare Other | Source: Ambulatory Visit

## 2022-01-07 ENCOUNTER — Other Ambulatory Visit: Payer: Self-pay | Admitting: Family Medicine

## 2022-01-07 DIAGNOSIS — Z1231 Encounter for screening mammogram for malignant neoplasm of breast: Secondary | ICD-10-CM

## 2022-01-08 DIAGNOSIS — M810 Age-related osteoporosis without current pathological fracture: Secondary | ICD-10-CM | POA: Diagnosis not present

## 2022-01-08 DIAGNOSIS — R7303 Prediabetes: Secondary | ICD-10-CM | POA: Diagnosis not present

## 2022-01-08 DIAGNOSIS — I10 Essential (primary) hypertension: Secondary | ICD-10-CM | POA: Diagnosis not present

## 2022-01-08 DIAGNOSIS — I872 Venous insufficiency (chronic) (peripheral): Secondary | ICD-10-CM | POA: Diagnosis not present

## 2022-01-08 DIAGNOSIS — I25118 Atherosclerotic heart disease of native coronary artery with other forms of angina pectoris: Secondary | ICD-10-CM | POA: Diagnosis not present

## 2022-01-08 DIAGNOSIS — I159 Secondary hypertension, unspecified: Secondary | ICD-10-CM | POA: Diagnosis not present

## 2022-01-14 ENCOUNTER — Ambulatory Visit
Admission: RE | Admit: 2022-01-14 | Discharge: 2022-01-14 | Disposition: A | Discharge status: Home / Self Care | Payer: Medicare Other | Source: Ambulatory Visit | Attending: Family Medicine | Admitting: Family Medicine

## 2022-01-14 DIAGNOSIS — Z1231 Encounter for screening mammogram for malignant neoplasm of breast: Secondary | ICD-10-CM | POA: Diagnosis not present

## 2022-01-21 DIAGNOSIS — H524 Presbyopia: Secondary | ICD-10-CM | POA: Diagnosis not present

## 2022-01-21 DIAGNOSIS — H10233 Serous conjunctivitis, except viral, bilateral: Secondary | ICD-10-CM | POA: Diagnosis not present

## 2022-01-21 DIAGNOSIS — Z961 Presence of intraocular lens: Secondary | ICD-10-CM | POA: Diagnosis not present

## 2022-01-21 DIAGNOSIS — H43813 Vitreous degeneration, bilateral: Secondary | ICD-10-CM | POA: Diagnosis not present

## 2022-01-21 DIAGNOSIS — H02055 Trichiasis without entropian left lower eyelid: Secondary | ICD-10-CM | POA: Diagnosis not present

## 2022-01-21 DIAGNOSIS — H5203 Hypermetropia, bilateral: Secondary | ICD-10-CM | POA: Diagnosis not present

## 2022-01-21 DIAGNOSIS — H52223 Regular astigmatism, bilateral: Secondary | ICD-10-CM | POA: Diagnosis not present

## 2022-01-29 ENCOUNTER — Ambulatory Visit (INDEPENDENT_AMBULATORY_CARE_PROVIDER_SITE_OTHER): Payer: Medicare Other

## 2022-01-29 ENCOUNTER — Telehealth: Payer: Self-pay

## 2022-01-29 DIAGNOSIS — E538 Deficiency of other specified B group vitamins: Secondary | ICD-10-CM | POA: Diagnosis not present

## 2022-01-29 MED ORDER — CYANOCOBALAMIN 1000 MCG/ML IJ SOLN
1000.0000 ug | Freq: Once | INTRAMUSCULAR | Status: AC
  Administered 2022-01-29: 1000 ug via INTRAMUSCULAR

## 2022-01-29 NOTE — Telephone Encounter (Signed)
Pt brought form for renewal of handicap placard that will expire 04/10/2022. Pt request cb when ready for pick up. Sending note to Dr Glori Bickers and [employer]. Form placed in in box in Dr Alba Cory office.

## 2022-01-29 NOTE — Progress Notes (Signed)
Per orders of Dr. Elsie Stain, injection of Vitamin B 12 in left deltoid given by Ozzie Hoyle. Patient tolerated injection well.

## 2022-02-04 NOTE — Telephone Encounter (Signed)
Pt notified form ready for pick up 

## 2022-02-04 NOTE — Telephone Encounter (Signed)
Done and in IN box 

## 2022-02-11 NOTE — Progress Notes (Deleted)
Cardiology Office Note  Date:  02/11/2022   ID:  Jenna Young, DOB [birth year], MRN [id number]  PCP:  Abner Greenspan, MD   No chief complaint on file.   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 who follows up today for her hypertension, near syncope/syncope,  stable angina  LOV 12/2020  Lives alone, son lives nearby No chest pain Muscle tired in arms with overuse  "Can't walk much", legs feel weak Sedentary  No chest pain or SOB Sleeps well  Crestor Myalgias on Lipitor  Blood pressure at home typically 140-1 50 Higher on today's visit in the office  Denies any recent near-syncope or syncope Several episodes of near syncope/syncope in 2021  NSTEMI 05/2019 cardiac catheterization showing occluded RCA with collaterals from left to right  EKG personally reviewed by myself on todays visit Normal sinus rhythm rate 66 bpm left bundle branch block  Other past medical history reviewed Long hx of anemia, HGB 11 Iron infusions x2 Follows up with heme  Syncope 2020  on Saturday, was in the garden, 11 AM Reports that she was carrying some pot plants outside, started to feel dizzy Made her way inside but was dizzy, finally sat down inside Sister presents with her today reports that she had syncope per the son who was there with her Had bowel  incontinence Did not hurt herself  06/13/2019, reports that she had episode of syncope Ate some toast, then went shopping Maybe had some tea when she was out on the road Came back home, family was there, immediately started cooking Was in the kitchen when she felt weird, saw black spots, had to sit down Became unresponsive on the couch Family later on the floor Could not find pulse Blood pressure 61/40s, pulse  ? EMTs arrived, she came too  Chronic arm weakness started in March 2020 Stop the statin on her own symptoms did not get better Arm discomfort when moving the garbage can, raising her arms for long periods.  They feel tired  Prior episode of syncope while at the beauty parlor Had hair done, got up to a when she felt lightheaded and passed out  She did not fall or sustain any injuries from this event.   When EMS arrived patient's blood glucose was 137 and her blood pressure was 74/43 for which she received 500 cc of fluid.  In the hospital, TNT 2.48  started on heparin, medical management No cardiac catheterization performed as numbers were trending down and she was asymptomatic Etiology of the events unclear whether this was vasovagal, arrhythmia,  Lab work did not appear to be indicative of dehydration  hospital Admission for NSTEMI , 02/10/2017 D/c 02/13/2017  Cath Significant mid RCA disease with sequential, calcified 80 and 95% stenoses. Element of thrombus is likely also present. 90% ostial stenosis involving small OM1 branch. Mildly reduced left ventricular contraction with inferior hypokinesis (LVEF ~45%). Mildly elevated left ventricular filling pressure. Unsuccessful PCI to mid RCA due to inability to cross the mid RCA stenosis despite aggressive buddy wire support.  Recommendation : if she has unstable angina symptoms could consider PCI with atherectomy to mid RCA   Echo 42/6834 Systolic function was normal. The   estimated ejection fraction was in the range of 50% to 55%.   Hypokinesis  of the inferior myocardium.  diagnosis of DCIS, had a biopsy bx Completed XRT   previously on amlodipine but wanted to change secondary to leg edema Started on bystolic Blood pressure continue to run high Started on HCTZ every other day  could not afford Bystolic long term   was changed to clonidine.  Presented to the emergency room December 31 2015 with near syncope , dehydration   "Fell asleep in the sun " while she was on a swing set, was difficult to arouse, was taken inside by family, had large bowel movement   in the emergency room heart rate 62 bpm , sodium down to 129, creatinine elevated Notes indicate she was only taking clonidine once a day   PMH:   has a past medical history of Allergic rhinitis, Anemia, Anxiety, GERD (gastroesophageal reflux disease), History of radiation therapy (04/29/16- 05/24/16), Hyperlipidemia, Hypertension, Osteopenia, Osteoporosis, and Personal history of radiation therapy (2018).  PSH:    Past Surgical History:  Procedure Laterality Date   APPENDECTOMY     BREAST BIOPSY Left 11/29/2008   BREAST BIOPSY Left 01/26/2016   malignant   BREAST EXCISIONAL BIOPSY Left 12/26/2008   BREAST LUMPECTOMY Left 02/27/2016   BREAST LUMPECTOMY Left 04/01/2016   BREAST LUMPECTOMY WITH RADIOACTIVE SEED LOCALIZATION Left 02/27/2016   Procedure: LEFT BREAST LUMPECTOMY WITH RADIOACTIVE SEED LOCALIZATION;  Surgeon: Autumn Messing III, MD;  Location: [facility];  Service: General;  Laterality: Left;   BREAST SURGERY  10/10   benign biopsy  negative   CESAREAN SECTION     CHOLECYSTECTOMY     CORONARY STENT INTERVENTION N/A 02/11/2017   Procedure: CORONARY STENT INTERVENTION;  Surgeon: Nelva Bush, MD;  Location: [facility];  Service: Cardiovascular;  Laterality: N/A;   FEMUR FRACTURE SURGERY Right 2006   HIP ARTHROPLASTY Left 06/28/2021   Procedure: ARTHROPLASTY BIPOLAR HIP (HEMIARTHROPLASTY);  Surgeon: Renee Harder, MD;  Location: [facility];  Service: Orthopedics;  Laterality: Left;   LEFT HEART CATH AND CORONARY ANGIOGRAPHY N/A 02/11/2017   Procedure: LEFT HEART CATH AND CORONARY ANGIOGRAPHY;  Surgeon: Nelva Bush, MD;  Location: [facility];  Service: Cardiovascular;  Laterality: N/A;   LEFT HEART CATH AND CORONARY ANGIOGRAPHY N/A 05/21/2019   Procedure: LEFT HEART CATH AND CORONARY ANGIOGRAPHY;  Surgeon:  Wellington Hampshire, MD;  Location: [place] [facility];  Service: Cardiovascular;  Laterality: N/A;   OVARIAN CYST SURGERY     RE-EXCISION OF BREAST LUMPECTOMY Left 04/01/2016   Procedure: RE-EXCISION OF LEFT BREAST MEDIAL MARGIN;  Surgeon: Autumn Messing III, MD;  Location: [place];  Service: General;  Laterality: Left;    Current Outpatient Medications  Medication Sig Dispense Refill   carvedilol (COREG) 3.125 MG tablet TAKE 1 TABLET(3.125 MG) BY MOUTH TWICE DAILY 180 tablet 2   Cholecalciferol (VITAMIN D) 2000 UNITS CAPS Take 1 capsule by mouth daily.     clopidogrel (PLAVIX) 75 MG tablet TAKE 1 TABLET(75 MG) BY MOUTH DAILY WITH BREAKFAST, crush pill if unable to swallow 90 tablet 3   cyanocobalamin (,VITAMIN B-12,) 1000 MCG/ML injection Inject 1,000 mcg into the muscle every 30 (thirty) days.     docusate sodium (COLACE) 100 MG capsule Take 2 capsules (200 mg total) by mouth 2 (two) times daily. 10 capsule 0   losartan (COZAAR) 100 MG tablet TAKE 1 TABLET(100 MG) BY MOUTH DAILY 90 tablet 2   Multiple Vitamin (MULTIVITAMIN WITH MINERALS) TABS tablet Take 1 tablet by mouth daily. 30 tablet  1   nitroGLYCERIN (NITROSTAT) 0.4 MG SL tablet PLACE 1 TABLET UNDER TONGUE EVERY 5 MIN AS NEEDED FOR CHEST PAIN IF NO RELIEF IN15 MIN CALL 911 (MAX 3 TABS) 25 tablet 1   omeprazole (PRILOSEC) 20 MG capsule TAKE 1 CAPSULE(20 MG) BY MOUTH DAILY AS NEEDED 90 capsule 3   rosuvastatin (CRESTOR) 10 MG tablet TAKE 1 TABLET(10 MG) BY MOUTH DAILY 90 tablet 2   Skin Protectants, Misc. (EUCERIN) cream Apply 1 application. topically as needed for dry skin.     No current facility-administered medications for this visit.     Allergies:   Buspirone hcl, Codeine, Hydrochlorothiazide, Iron, Lansoprazole, Minocycline hcl, Oxycodone, Penicillins, Phenergan [promethazine hcl], Ramipril, Ranexa [ranolazine], Sulfa antibiotics, and Rosuvastatin   Social History:  The patient  reports that she quit smoking about  53 years ago. Her smoking use included cigarettes. She has never used smokeless tobacco. She reports that she does not drink alcohol and does not use drugs.   Family History:   family history includes Cancer in her maternal aunt; Diabetes in her maternal aunt; Heart disease in her father; Hypertension in her mother; Macular degeneration in her sister.   Review of Systems  HENT: Negative.    Eyes: Negative.   Respiratory: Negative.    Cardiovascular: Negative.   Gastrointestinal: Negative.   Genitourinary: Negative.   Musculoskeletal: Negative.   Neurological: Negative.   Psychiatric/Behavioral: Negative.    All other systems reviewed and are negative.   PHYSICAL EXAM: VS:  There were no vitals taken for this visit. , BMI There is no height or weight on file to calculate BMI.  Constitutional:  oriented to person, place, and time. No distress.  HENT:  Head: Grossly normal Eyes:  no discharge. No scleral icterus.  Neck: No JVD, no carotid bruits  Cardiovascular: Regular rate and rhythm, no murmurs appreciated Pulmonary/Chest: Clear to auscultation bilaterally, no wheezes or rails Abdominal: Soft.  no distension.  no tenderness.  Musculoskeletal: Normal range of motion Neurological:  normal muscle tone. Coordination normal. No atrophy Skin: Skin warm and dry Psychiatric: normal affect, pleasant  Recent Labs: 05/11/2021: TSH 2.65 06/27/2021: ALT 17 06/28/2021: Platelets 155 06/29/2021: Hemoglobin 9.3 11/19/2021: BUN 8; Creatinine, Ser 0.89; Potassium 4.3; Sodium 137    Lipid Panel Lab Results  Component Value Date   CHOL 147 05/11/2021   HDL 62.80 05/11/2021   LDLCALC 65 05/11/2021   TRIG 98.0 05/11/2021      Wt Readings from Last 3 Encounters:  11/19/21 125 lb 4 oz (56.8 kg)  06/27/21 138 lb 0.1 oz (62.6 kg)  05/18/21 138 lb (62.6 kg)      ASSESSMENT AND PLAN:  Coronary artery disease with stable angina catheterization with chronically occluded RCA collaterals from  left to right Currently with no symptoms of angina. No further workup at this time. Continue current medication regimen.  Ideally goal LDL less than 70  Syncope No further epsiodes Staying hydrated Blood pressure running high but he is nervous about recurrent episodes of syncope, would not be too aggressive with blood pressure Plan as below  Essential hypertension, benign -  Blood pressure mildly elevated at home she reports 140 to 150  Recommend she continue on half dose isosorbide with extra half dose for pressure over 150  Pure hypercholesterolemia Continue crestor 10 Myalgias on Lipitor  Ductal carcinoma in situ (DCIS) of left breast Treatment is complete, radiation to the left    Total encounter time more than 25 minutes  Greater than  50% was spent in counseling and coordination of care with the patient    No orders of the defined types were placed in this encounter.   Signed, Esmond Plants, M.D., Ph.D. 02/11/2022  [place], [place]

## 2022-02-12 ENCOUNTER — Ambulatory Visit: Payer: Medicare Other | Admitting: Cardiovascular Disease

## 2022-02-12 ENCOUNTER — Telehealth: Payer: Self-pay | Admitting: Family Medicine

## 2022-02-12 DIAGNOSIS — I5022 Chronic systolic (congestive) heart failure: Secondary | ICD-10-CM

## 2022-02-12 DIAGNOSIS — I25118 Atherosclerotic heart disease of native coronary artery with other forms of angina pectoris: Secondary | ICD-10-CM

## 2022-02-12 DIAGNOSIS — R519 Headache, unspecified: Secondary | ICD-10-CM | POA: Diagnosis not present

## 2022-02-12 DIAGNOSIS — U071 COVID-19: Secondary | ICD-10-CM | POA: Diagnosis not present

## 2022-02-12 DIAGNOSIS — D649 Anemia, unspecified: Secondary | ICD-10-CM

## 2022-02-12 DIAGNOSIS — I10 Essential (primary) hypertension: Secondary | ICD-10-CM

## 2022-02-12 DIAGNOSIS — E785 Hyperlipidemia, unspecified: Secondary | ICD-10-CM

## 2022-02-12 DIAGNOSIS — M791 Myalgia, unspecified site: Secondary | ICD-10-CM

## 2022-02-13 ENCOUNTER — Telehealth: Payer: Self-pay

## 2022-02-13 NOTE — Telephone Encounter (Signed)
She needs to go to ER now for urgent evaluation- not sure what is going on but needs to be seen  Thanks

## 2022-02-13 NOTE — Telephone Encounter (Signed)
I spoke with Jenna Young not on DPR and Jenna Young (DPR signed) and pt started on 02/10/22 had a bad H/A, and prod cough not sure color of phlegm, runny nose. Pt + covid on 02/10/22. Pt was seen at [facility] on 02/12/22 and given Legevrio and family wants to verify OK for pt to take. Per UC pt has lost 23 lbs since Oct. On 1205/23 pt was incontinent of bowel, very confused, took several tries for pt to get out of recliner and pt having difficulty in walking that has continued this morning. Yesterday house was not in usual good order; dishes in kitchen and pt is always neat. This morning pt having difficulty in walking and very dizzy. Delton See should take pt to ED for eval and possible testing. No available appts at [facility] today. Jenna Young wants note sent to Dr Glori Bickers and see what she wants them to do and to verify ok for pt to take [place]. Sending note to Dr Glori Bickers, [facility] pool and will speak with float for Dr Glori Bickers.  [facility] Night - Client Nonclinical Telephone Record  [facility] Client [facility] Night - Client Client Site [place] Provider Loura Pardon - MD Contact Type Call Who Is Calling Patient / Member / Family / Caregiver Caller Name Jenna Young Phone Number [phone] Patient Name Jenna Young Patient DOB 1931-12-03 Call Type Message Only Information Provided Reason for Call Request for General Office Information Initial Comment Caller states her aunt is at the Urgent Care and they are requesting what she her allergies are. Additional Comment Office hours provided. Please call ASAP with that information. Disp. Time Disposition Final User 02/12/2022 5:05:05 PM General Information Provided Yes Orrin Brigham Call Closed By: Orrin Brigham Transaction Date/Time: 02/12/2022 5:00:55 PM (ET

## 2022-02-13 NOTE — Telephone Encounter (Signed)
Aware, will watch for correspondence  

## 2022-02-13 NOTE — Telephone Encounter (Signed)
I spoke with Lelon Frohlich and Lelon Frohlich notified as instructed and voiced understanding. Lelon Frohlich said she will probably go to [facility] ED. Sending FYI to Dr Glori Bickers.

## 2022-02-14 NOTE — Telephone Encounter (Signed)
I don't see any ER notes from [facility] Please check in

## 2022-02-14 NOTE — Telephone Encounter (Signed)
Called daughter Lelon Frohlich and she said overall pt is doing better but just very weak/ fatigued. Pt's cough, congestion, fever  has all resolved but she is just really weak. Family tried to get pt to go to ER for IV fluids and to be evaluated and pt refused she has stated to family multiple times that she isn't going back to ER at all. Family is trying to keep her hydrated and today she has eaten a little more so they can see progress. They will keep encouraging food and fluids and have her rest. Lelon Frohlich advised if any issues or concerns to let us know.

## 2022-02-14 NOTE — Telephone Encounter (Signed)
Aware, thanks!

## 2022-02-26 NOTE — Telephone Encounter (Signed)
Error

## 2022-03-06 ENCOUNTER — Ambulatory Visit (INDEPENDENT_AMBULATORY_CARE_PROVIDER_SITE_OTHER): Payer: Medicare Other

## 2022-03-06 DIAGNOSIS — E538 Deficiency of other specified B group vitamins: Secondary | ICD-10-CM | POA: Diagnosis not present

## 2022-03-06 MED ORDER — CYANOCOBALAMIN 1000 MCG/ML IJ SOLN
1000.0000 ug | Freq: Once | INTRAMUSCULAR | Status: AC
  Administered 2022-03-06: 1000 ug via INTRAMUSCULAR

## 2022-03-06 NOTE — Progress Notes (Signed)
Per orders of Dr. Loura Pardon, injection of Vitamin B 12 given by Ozzie Hoyle. Patient tolerated injection well.

## 2022-03-18 ENCOUNTER — Other Ambulatory Visit: Payer: Self-pay | Admitting: Cardiovascular Disease

## 2022-04-08 NOTE — Progress Notes (Signed)
Cardiology Office Note  Date:  04/09/2022   ID:  Jenna Young, DOB Oct 25, 1931, MRN [id number]  PCP:  Jenna Pimple, MD   Chief Complaint  Patient presents with   12 month follow up     "Doing well." Medications reviewed by the patient verbally.     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 who follows up today for her hypertension, near syncope/syncope,  stable angina  LOV 12/2020 Discussed events from last year [facility], needed [facility] 4/23 Spent time in rehab after surgery Off imdur secondary to near syncope/syncope in recovery Still with gait instability, walks with a cane  Denies chest pain concerning for angina on exertion Recently swept kitchen floor, arm and chest pain, felt to be musculoskeletal Sweeps the carpet Balance is not good enough to vacuum  Does exercise at home, does not want further PT No recent falls Lives alone, son lives nearby, continues to drive  Tolerating Crestor 10 Blood pressure at home 1 30-1 40 Significant weight loss over 17 pounds over the past year  Several episodes of near syncope/syncope in 2021  NSTEMI 05/2019 cardiac catheterization showing occluded RCA with collaterals from left to right  EKG personally reviewed by myself on todays visit Normal sinus rhythm rate 69 bpm left bundle branch block  Other past medical history reviewed Long hx of anemia, HGB 11 Iron infusions x2 Follows up with heme  Syncope 2020  on Saturday, was in the garden, 11 AM Reports that she was carrying some pot plants outside, started to feel dizzy Made her way inside but was dizzy, finally sat down inside Sister presents with her today reports that she had syncope per the son who was there with her Had bowel   incontinence Did not hurt herself  06/13/2019, reports that she had episode of syncope Ate some toast, then went shopping Maybe had some tea when she was out on the road Came back home, family was there, immediately started cooking Was in the kitchen when she felt weird, saw black spots, had to sit down Became unresponsive on the couch Family later on the floor Could not find pulse Blood pressure 61/40s, pulse ? EMTs arrived, she came too  Chronic arm weakness started in March 2020 Stop the statin on her own symptoms did not get better Arm discomfort when moving the garbage can, raising her arms for long periods.  They feel tired  Prior episode of syncope while at the beauty parlor Had hair done, got up to a when she felt lightheaded and passed out  She did not fall or sustain any injuries from this event.   When EMS arrived patient's blood glucose was 137 and her blood pressure was 74/43 for which she received 500 cc of fluid.  In the hospital, TNT 2.48  started on heparin, medical management No cardiac catheterization performed as numbers were trending down and she was asymptomatic Etiology of the events unclear whether this was vasovagal, arrhythmia,  Lab work did not appear to be indicative of dehydration  hospital Admission for NSTEMI , 02/10/2017 D/c 02/13/2017  Cath Significant mid RCA disease with sequential, calcified 80 and 95% stenoses. Element of thrombus is likely also present. 90% ostial stenosis involving small OM1 branch. Mildly reduced left ventricular contraction  with inferior hypokinesis (LVEF ~45%). Mildly elevated left ventricular filling pressure. Unsuccessful PCI to mid RCA due to inability to cross the mid RCA stenosis despite aggressive buddy wire support.  Recommendation : if she has unstable angina symptoms could consider PCI with atherectomy to mid RCA   Echo 02/2017 Systolic function was normal. The   estimated ejection fraction was in the range of  50% to 55%.   Hypokinesis of the inferior myocardium.  diagnosis of DCIS, had a biopsy bx Completed XRT   previously on amlodipine but wanted to change secondary to leg edema Started on bystolic Blood pressure continue to run high Started on HCTZ every other day  could not afford Bystolic long term   was changed to clonidine.  Presented to the emergency room December 31 2015 with near syncope , dehydration  "Fell asleep in the sun " while she was on a swing set, was difficult to arouse, was taken inside by family, had large bowel movement   in the emergency room heart rate 62 bpm , sodium down to 129, creatinine elevated Notes indicate she was only taking clonidine once a day   PMH:   has a past medical history of Allergic rhinitis, Anemia, Anxiety, GERD (gastroesophageal reflux disease), History of radiation therapy (04/29/16- 05/24/16), Hyperlipidemia, Hypertension, Osteopenia, Osteoporosis, and Personal history of radiation therapy (2018).  PSH:    Past Surgical History:  Procedure Laterality Date   APPENDECTOMY     BREAST BIOPSY Left 11/29/2008   BREAST BIOPSY Left 01/26/2016   malignant   BREAST EXCISIONAL BIOPSY Left 12/26/2008   BREAST LUMPECTOMY Left 02/27/2016   BREAST LUMPECTOMY Left 04/01/2016   BREAST LUMPECTOMY WITH RADIOACTIVE SEED LOCALIZATION Left 02/27/2016   Procedure: LEFT BREAST LUMPECTOMY WITH RADIOACTIVE SEED LOCALIZATION;  Surgeon: Chevis Pretty III, MD;  Location: [facility];  Service: General;  Laterality: Left;   BREAST SURGERY  10/10   benign biopsy  negative   CESAREAN SECTION     CHOLECYSTECTOMY     CORONARY STENT INTERVENTION N/A 02/11/2017   Procedure: CORONARY STENT INTERVENTION;  Surgeon: Yvonne Kendall, MD;  Location: [facility];  Service: Cardiovascular;  Laterality: N/A;   FEMUR FRACTURE SURGERY Right 2006   HIP ARTHROPLASTY Left 06/28/2021   Procedure: ARTHROPLASTY BIPOLAR HIP (HEMIARTHROPLASTY);  Surgeon: Ross Marcus, MD;  Location: [facility];  Service: Orthopedics;  Laterality: Left;   LEFT HEART CATH AND CORONARY ANGIOGRAPHY N/A 02/11/2017   Procedure: LEFT HEART CATH AND CORONARY ANGIOGRAPHY;  Surgeon: Yvonne Kendall, MD;  Location: [facility];  Service: Cardiovascular;  Laterality: N/A;   LEFT HEART CATH AND CORONARY ANGIOGRAPHY N/A 05/21/2019   Procedure: LEFT HEART CATH AND CORONARY ANGIOGRAPHY;  Surgeon: Iran Ouch, MD;  Location: [facility];  Service: Cardiovascular;  Laterality: N/A;   OVARIAN CYST SURGERY     RE-EXCISION OF BREAST LUMPECTOMY Left 04/01/2016   Procedure: RE-EXCISION OF LEFT BREAST MEDIAL MARGIN;  Surgeon: Chevis Pretty III, MD;  Location: [facility];  Service: General;  Laterality: Left;    Current Outpatient Medications  Medication Sig Dispense Refill   carvedilol (COREG) 3.125 MG tablet TAKE 1 TABLET(3.125 MG) BY MOUTH TWICE DAILY 60 tablet 0   Cholecalciferol (VITAMIN D) 2000 UNITS CAPS Take 1 capsule by mouth daily.     clopidogrel (PLAVIX) 75 MG tablet TAKE 1 TABLET(75 MG) BY MOUTH DAILY WITH BREAKFAST, crush pill if unable to swallow 90 tablet 3   cyanocobalamin (,VITAMIN B-12,)  1000 MCG/ML injection Inject 1,000 mcg into the muscle every 30 (thirty) days.     docusate sodium (COLACE) 100 MG capsule Take 2 capsules (200 mg total) by mouth 2 (two) times daily. 10 capsule 0   losartan (COZAAR) 100 MG tablet TAKE 1 TABLET(100 MG) BY MOUTH DAILY 90 tablet 2   meclizine (ANTIVERT) 25 MG tablet Take 25 mg by mouth 2 (two) times daily as needed.     Multiple Vitamin (MULTIVITAMIN WITH MINERALS) TABS tablet Take 1 tablet by mouth daily. 30 tablet 1   nitroGLYCERIN (NITROSTAT) 0.4 MG SL tablet PLACE 1 TABLET UNDER TONGUE EVERY 5 MIN AS NEEDED FOR CHEST PAIN IF NO RELIEF IN15 MIN CALL 911 (MAX 3 TABS) 25 tablet 1   omeprazole (PRILOSEC) 20 MG capsule TAKE 1 CAPSULE(20 MG) BY MOUTH DAILY AS NEEDED 90 capsule 3   rosuvastatin (CRESTOR) 10 MG  tablet TAKE 1 TABLET(10 MG) BY MOUTH DAILY 30 tablet 0   Skin Protectants, Misc. (EUCERIN) cream Apply 1 application. topically as needed for dry skin.     No current facility-administered medications for this visit.     Allergies:   Buspirone hcl, Codeine, Hydrochlorothiazide, Iron, Isosorbide nitrate, Lansoprazole, Minocycline hcl, Oxycodone, Penicillins, Phenergan [promethazine hcl], Ramipril, Ranexa [ranolazine], Sulfa antibiotics, and Rosuvastatin   Social History:  The patient  reports that she quit smoking about 54 years ago. Her smoking use included cigarettes. She has never used smokeless tobacco. She reports that she does not drink alcohol and does not use drugs.   Family History:   family history includes Cancer in her maternal aunt; Diabetes in her maternal aunt; Heart disease in her father; Hypertension in her mother; Macular degeneration in her sister.   Review of Systems  HENT: Negative.    Eyes: Negative.   Respiratory: Negative.    Cardiovascular: Negative.   Gastrointestinal: Negative.   Genitourinary: Negative.   Musculoskeletal: Negative.   Neurological: Negative.   Psychiatric/Behavioral: Negative.    All other systems reviewed and are negative.   PHYSICAL EXAM: VS:  BP (!) 160/60 (BP Location: Left Arm, Patient Position: Sitting, Cuff Size: Normal)   Pulse 69   Ht 5\' 1"  (1.549 m)   Wt 121 lb 8 oz (55.1 kg)   SpO2 98%   BMI 22.96 kg/m  , BMI Body mass index is 22.96 kg/m. Constitutional:  oriented to person, place, and time. No distress.  HENT:  Head: Grossly normal Eyes:  no discharge. No scleral icterus.  Neck: No JVD, no carotid bruits  Cardiovascular: Regular rate and rhythm, no murmurs appreciated Pulmonary/Chest: Clear to auscultation bilaterally, no wheezes or rails Abdominal: Soft.  no distension.  no tenderness.  Musculoskeletal: Normal range of motion Neurological:  normal muscle tone. Coordination normal. No atrophy Skin: Skin warm and  dry Psychiatric: normal affect, pleasant  Recent Labs: 05/11/2021: TSH 2.65 06/27/2021: ALT 17 06/28/2021: Platelets 155 06/29/2021: Hemoglobin 9.3 11/19/2021: BUN 8; Creatinine, Ser 0.89; Potassium 4.3; Sodium 137    Lipid Panel Lab Results  Component Value Date   CHOL 147 05/11/2021   HDL 62.80 05/11/2021   LDLCALC 65 05/11/2021   TRIG 98.0 05/11/2021      Wt Readings from Last 3 Encounters:  04/09/22 121 lb 8 oz (55.1 kg)  11/19/21 125 lb 4 oz (56.8 kg)  06/27/21 138 lb 0.1 oz (62.6 kg)      ASSESSMENT AND PLAN:  Coronary artery disease with stable angina catheterization with chronically occluded RCA collaterals from left to right  Recent atypical chest pain likely musculoskeletal after sweeping the carpet at home No further workup at this time, cholesterol at goal, Crestor 10  Syncope No further epsiodes Will allow liberal blood pressure No changes to blood pressure medications Off isosorbide after prior near-syncope/syncope  Essential hypertension, benign -  Blood pressure at home 1 30-1 40 systolic, elevated in the office today after rushing in, could not find parking spot No changes made to medications  Pure hypercholesterolemia Continue crestor 10, numbers at goal Myalgias on Lipitor  Ductal carcinoma in situ (DCIS) of left breast Treatment is complete, radiation to the left    Total encounter time more than 30 minutes  Greater than 50% was spent in counseling and coordination of care with the patient    No orders of the defined types were placed in this encounter.   Signed, Dossie Arbour, M.D., Ph.D. 04/09/2022  [facility] [place], [place] [phone]

## 2022-04-09 ENCOUNTER — Encounter: Payer: Self-pay | Admitting: Cardiovascular Disease

## 2022-04-09 ENCOUNTER — Ambulatory Visit: Payer: Medicare Other | Attending: Cardiovascular Disease | Admitting: Cardiovascular Disease

## 2022-04-09 ENCOUNTER — Ambulatory Visit (INDEPENDENT_AMBULATORY_CARE_PROVIDER_SITE_OTHER): Payer: Medicare Other

## 2022-04-09 ENCOUNTER — Ambulatory Visit: Payer: Medicare Other

## 2022-04-09 VITALS — BP 150/60 | HR 69 | Ht 61.0 in | Wt 121.5 lb

## 2022-04-09 DIAGNOSIS — T466X5D Adverse effect of antihyperlipidemic and antiarteriosclerotic drugs, subsequent encounter: Secondary | ICD-10-CM

## 2022-04-09 DIAGNOSIS — D649 Anemia, unspecified: Secondary | ICD-10-CM | POA: Diagnosis not present

## 2022-04-09 DIAGNOSIS — I25118 Atherosclerotic heart disease of native coronary artery with other forms of angina pectoris: Secondary | ICD-10-CM | POA: Insufficient documentation

## 2022-04-09 DIAGNOSIS — I5022 Chronic systolic (congestive) heart failure: Secondary | ICD-10-CM | POA: Diagnosis not present

## 2022-04-09 DIAGNOSIS — I10 Essential (primary) hypertension: Secondary | ICD-10-CM | POA: Diagnosis not present

## 2022-04-09 DIAGNOSIS — M791 Myalgia, unspecified site: Secondary | ICD-10-CM | POA: Diagnosis not present

## 2022-04-09 DIAGNOSIS — E538 Deficiency of other specified B group vitamins: Secondary | ICD-10-CM | POA: Diagnosis not present

## 2022-04-09 DIAGNOSIS — T466X5A Adverse effect of antihyperlipidemic and antiarteriosclerotic drugs, initial encounter: Secondary | ICD-10-CM | POA: Diagnosis not present

## 2022-04-09 DIAGNOSIS — E785 Hyperlipidemia, unspecified: Secondary | ICD-10-CM | POA: Insufficient documentation

## 2022-04-09 MED ORDER — CYANOCOBALAMIN 1000 MCG/ML IJ SOLN
1000.0000 ug | Freq: Once | INTRAMUSCULAR | Status: AC
  Administered 2022-04-09: 1000 ug via INTRAMUSCULAR

## 2022-04-09 MED ORDER — CARVEDILOL 3.125 MG PO TABS: ORAL_TABLET | ORAL | 11 refills | Status: DC

## 2022-04-09 NOTE — Patient Instructions (Signed)
Medication Instructions:  No changes  If you need a refill on your cardiac medications before your next appointment, please call your pharmacy.   Lab work: No new labs needed  Testing/Procedures: No new testing needed  Follow-Up: At [facility], you and your health needs are our priority.  As part of our continuing mission to provide you with exceptional heart care, we have created designated Provider Care Teams.  These Care Teams include your primary Cardiologist (physician) and Advanced Practice Providers (APPs -  Physician Assistants and Nurse Practitioners) who all work together to provide you with the care you need, when you need it.  You will need a follow up appointment in 12 months  Providers on your designated Care Team:   Christopher Berge, NP Ryan Dunn, PA-C Cadence Furth, PA-C  COVID-19 Vaccine Information can be found at: [email or web address] For questions related to vaccine distribution or appointments, please email [email or web address] or call [phone].   

## 2022-04-09 NOTE — Progress Notes (Signed)
Per orders of Dr. Marne Tower, injection of B-12 given by Cayce Quezada in left deltoid. Patient tolerated injection well.     

## 2022-04-13 ENCOUNTER — Other Ambulatory Visit: Payer: Self-pay | Admitting: Cardiovascular Disease

## 2022-04-15 ENCOUNTER — Other Ambulatory Visit: Payer: Self-pay

## 2022-04-15 MED ORDER — ROSUVASTATIN CALCIUM 10 MG PO TABS: ORAL_TABLET | ORAL | 0 refills | Status: DC

## 2022-04-15 NOTE — Telephone Encounter (Signed)
Please reschedule F/U appointment for further refills. Thank you!

## 2022-04-16 ENCOUNTER — Ambulatory Visit (INDEPENDENT_AMBULATORY_CARE_PROVIDER_SITE_OTHER): Payer: Medicare Other | Admitting: Family Medicine

## 2022-04-16 ENCOUNTER — Encounter: Payer: Self-pay | Admitting: Family Medicine

## 2022-04-16 ENCOUNTER — Ambulatory Visit (INDEPENDENT_AMBULATORY_CARE_PROVIDER_SITE_OTHER)
Admission: RE | Admit: 2022-04-16 | Discharge: 2022-04-16 | Disposition: A | Discharge status: Home / Self Care | Payer: Medicare Other | Source: Ambulatory Visit | Attending: Family Medicine | Admitting: Family Medicine

## 2022-04-16 VITALS — BP 146/70 | HR 88 | Temp 97.7°F | Ht 61.0 in | Wt 123.0 lb

## 2022-04-16 DIAGNOSIS — J8410 Pulmonary fibrosis, unspecified: Secondary | ICD-10-CM | POA: Diagnosis not present

## 2022-04-16 DIAGNOSIS — R059 Cough, unspecified: Secondary | ICD-10-CM | POA: Diagnosis not present

## 2022-04-16 DIAGNOSIS — J069 Acute upper respiratory infection, unspecified: Secondary | ICD-10-CM | POA: Diagnosis not present

## 2022-04-16 DIAGNOSIS — I25118 Atherosclerotic heart disease of native coronary artery with other forms of angina pectoris: Secondary | ICD-10-CM

## 2022-04-16 DIAGNOSIS — J439 Emphysema, unspecified: Secondary | ICD-10-CM | POA: Diagnosis not present

## 2022-04-16 NOTE — Patient Instructions (Signed)
Look for mucinex DM or robitussin DM over the counter  This will help loosen the phlegm in chest and suppress the cough   Let's do a chest xray today  We will call you with the result   Drink fluids ! Rest when you can  If you get much worse or trouble breathing go to the ER    Update if not starting to improve in a week or if worsening

## 2022-04-16 NOTE — Progress Notes (Signed)
Subjective:    Patient ID: Jenna Young, female    DOB: 1931/09/27, 89 y.o.   MRN: [id number]  HPI Pt presents for c/o cough  Wt Readings from Last 3 Encounters:  04/16/22 123 lb (55.8 kg)  04/09/22 121 lb 8 oz (55.1 kg)  11/19/21 125 lb 4 oz (56.8 kg)   23.24 kg/m  Vitals:   04/16/22 1531  BP: (!) 170/68  Pulse: 88  Temp: 97.7 F (36.5 C)  SpO2: 96%    Had covid in December- got better from that   No fever with this   Lasl week got some chest congestion  Covid test at home is neg  Can feel the rattle but not able to get phlegm out  No wheezing Does not feel bad  Cough is bad at times /cannot control it  Not hoarse   No fever  Some runny nose  No headache  Throat is ok  Ears are ok     Otc Cough drop    HTN Takes losartan and coreg  Takes omeprazole for GERD  DG Chest 2 View  Result Date: 04/16/2022 CLINICAL DATA:  Cough. EXAM: CHEST - 2 VIEW COMPARISON:  06/27/2021 FINDINGS: The cardiac silhouette, mediastinal and hilar contours are within normal limits. Significant underlying chronic lung disease with emphysema and pulmonary fibrosis. No definite acute overlying pulmonary infiltrate. No pleural effusions. The bony thorax is intact. Remote compression fractures of T12 and. IMPRESSION: Chronic underlying lung disease without definite superimposed acute infiltrate. Electronically Signed   By: Marijo Sanes M.D.   On: 04/16/2022 16:37      Patient Active Problem List   Diagnosis Date Noted   History of hip fracture 11/19/2021   Poor balance 05/18/2021   Great toe pain, right 05/18/2021   Urinary frequency 03/15/2021   Intertrigo labialis 03/15/2021   Frequent urination 08/24/2020   History of COVID-19 08/03/2020   Dermatitis 05/03/2020   Secondary hypertension    Iron deficiency anemia 05/12/2019   Prediabetes 04/15/2018   Encounter for screening mammogram for breast cancer 04/01/2017   H/O syncope 02/28/2017   Coronary artery disease of  native artery of native heart with stable angina pectoris ([place]) 02/21/2017   History of non-ST elevation myocardial infarction (NSTEMI) 02/10/2017   History of breast cancer 02/26/2016   Varicose vein of leg 10/09/2015   Fatigue 10/09/2015   Pedal edema 10/09/2015   Heart murmur 10/09/2015   History of spinal fracture 05/07/2015   Dysthymia 09/20/2013   Encounter for Medicare annual wellness exam 02/03/2013   Viral URI with cough 03/06/2011   Vitamin D deficiency 08/19/2008   ANXIETY 01/29/2008   Essential hypertension, benign 09/25/2007   B12 deficiency 09/24/2006   Hyperlipidemia 09/24/2006   Venous (peripheral) insufficiency 09/24/2006   ALLERGIC RHINITIS 09/24/2006   GERD 09/24/2006   Osteoporosis 09/24/2006   Past Medical History:  Diagnosis Date   Allergic rhinitis    Anemia    Anxiety    GERD (gastroesophageal reflux disease)    History of radiation therapy 04/29/16- 05/24/16   Left Breast 40.05 Gy in 15 fractions, Left Breast boost 10 Gy in 5 fractions.    Hyperlipidemia    Hypertension    treated by Dr Thurnell Garbe recently with syncope episode   Osteopenia    Osteoporosis    Personal history of radiation therapy 2018   Past Surgical History:  Procedure Laterality Date   APPENDECTOMY     BREAST BIOPSY Left 11/29/2008   BREAST BIOPSY  Left 01/26/2016   malignant   BREAST EXCISIONAL BIOPSY Left 12/26/2008   BREAST LUMPECTOMY Left 02/27/2016   BREAST LUMPECTOMY Left 04/01/2016   BREAST LUMPECTOMY WITH RADIOACTIVE SEED LOCALIZATION Left 02/27/2016   Procedure: LEFT BREAST LUMPECTOMY WITH RADIOACTIVE SEED LOCALIZATION;  Surgeon: Autumn Messing III, MD;  Location: [facility];  Service: General;  Laterality: Left;   BREAST SURGERY  10/10   benign biopsy  negative   CESAREAN SECTION     CHOLECYSTECTOMY     CORONARY STENT INTERVENTION N/A 02/11/2017   Procedure: CORONARY STENT INTERVENTION;  Surgeon: Nelva Bush, MD;  Location: [place] [facility];   Service: Cardiovascular;  Laterality: N/A;   FEMUR FRACTURE SURGERY Right 2006   HIP ARTHROPLASTY Left 06/28/2021   Procedure: ARTHROPLASTY BIPOLAR HIP (HEMIARTHROPLASTY);  Surgeon: Renee Harder, MD;  Location: [facility];  Service: Orthopedics;  Laterality: Left;   LEFT HEART CATH AND CORONARY ANGIOGRAPHY N/A 02/11/2017   Procedure: LEFT HEART CATH AND CORONARY ANGIOGRAPHY;  Surgeon: Nelva Bush, MD;  Location: [facility];  Service: Cardiovascular;  Laterality: N/A;   LEFT HEART CATH AND CORONARY ANGIOGRAPHY N/A 05/21/2019   Procedure: LEFT HEART CATH AND CORONARY ANGIOGRAPHY;  Surgeon: Wellington Hampshire, MD;  Location: [facility];  Service: Cardiovascular;  Laterality: N/A;   OVARIAN CYST SURGERY     RE-EXCISION OF BREAST LUMPECTOMY Left 04/01/2016   Procedure: RE-EXCISION OF LEFT BREAST MEDIAL MARGIN;  Surgeon: Autumn Messing III, MD;  Location: [facility];  Service: General;  Laterality: Left;   Social History   Tobacco Use   Smoking status: Former    Types: Cigarettes    Quit date: 03/11/1968    Years since quitting: 54.1   Smokeless tobacco: Never  Vaping Use   Vaping Use: Never used  Substance Use Topics   Alcohol use: No    Alcohol/week: 0.0 standard drinks of alcohol   Drug use: No   Family History  Problem Relation Age of Onset   Hypertension Mother    Heart disease Father    Macular degeneration Sister    Diabetes Maternal Aunt    Cancer Maternal Aunt        throat   Allergies  Allergen Reactions   Buspirone Hcl     REACTION: HEART PALPITATIONS   Codeine     REACTION: nausea and vomiting   Hydrochlorothiazide     REACTION: syncope-possibly from dehydration   Iron     Oral iron -per pt made her "pass out"    Isosorbide Nitrate Other (See Comments)    hypotension   Lansoprazole     REACTION: abd pain   Minocycline Hcl    Oxycodone Other (See Comments)    Hallucinations    Penicillins     REACTION: mouth numbness Has  patient had a PCN reaction causing immediate rash, facial/tongue/throat swelling, SOB or lightheadedness with hypotension: No Has patient had a PCN reaction causing severe rash involving mucus membranes or skin necrosis: No Has patient had a PCN reaction that required hospitalization: No Has patient had a PCN reaction occurring within the last 10 years: No If all of the above answers are "NO", then may proceed with Cephalosporin use.   Phenergan [Promethazine Hcl] Other (See Comments)    Body shakes, hallucinations   Ramipril     Cough    Ranexa [Ranolazine]     Constipation    Sulfa Antibiotics Nausea Only   Rosuvastatin     Patient  reports muscle pain, unable to take. Stopped around 07/22.   Current Outpatient Medications on File Prior to Visit  Medication Sig Dispense Refill   carvedilol (COREG) 3.125 MG tablet TAKE 1 TABLET(3.125 MG) BY MOUTH TWICE DAILY 60 tablet 11   Cholecalciferol (VITAMIN D) 2000 UNITS CAPS Take 1 capsule by mouth daily.     clopidogrel (PLAVIX) 75 MG tablet TAKE 1 TABLET(75 MG) BY MOUTH DAILY WITH BREAKFAST, crush pill if unable to swallow 90 tablet 3   cyanocobalamin (,VITAMIN B-12,) 1000 MCG/ML injection Inject 1,000 mcg into the muscle every 30 (thirty) days.     docusate sodium (COLACE) 100 MG capsule Take 2 capsules (200 mg total) by mouth 2 (two) times daily. 10 capsule 0   losartan (COZAAR) 100 MG tablet TAKE 1 TABLET(100 MG) BY MOUTH DAILY 90 tablet 2   meclizine (ANTIVERT) 25 MG tablet Take 25 mg by mouth 2 (two) times daily as needed.     Multiple Vitamin (MULTIVITAMIN WITH MINERALS) TABS tablet Take 1 tablet by mouth daily. 30 tablet 1   nitroGLYCERIN (NITROSTAT) 0.4 MG SL tablet PLACE 1 TABLET UNDER TONGUE EVERY 5 MIN AS NEEDED FOR CHEST PAIN IF NO RELIEF IN15 MIN CALL 911 (MAX 3 TABS) 25 tablet 1   omeprazole (PRILOSEC) 20 MG capsule TAKE 1 CAPSULE(20 MG) BY MOUTH DAILY AS NEEDED 90 capsule 3   rosuvastatin (CRESTOR) 10 MG tablet TAKE 1 TABLET(10  MG) BY MOUTH DAILY 30 tablet 0   Skin Protectants, Misc. (EUCERIN) cream Apply 1 application. topically as needed for dry skin.     No current facility-administered medications on file prior to visit.    Review of Systems  Constitutional:  Positive for appetite change and fatigue. Negative for fever.  HENT:  Positive for postnasal drip and rhinorrhea. Negative for congestion, ear pain, sinus pressure, sneezing and sore throat.   Eyes:  Negative for pain and discharge.  Respiratory:  Positive for cough. Negative for shortness of breath, wheezing and stridor.   Cardiovascular:  Negative for chest pain.  Gastrointestinal:  Negative for diarrhea, nausea and vomiting.  Genitourinary:  Negative for frequency, hematuria and urgency.  Musculoskeletal:  Negative for arthralgias and myalgias.  Skin:  Negative for rash.  Neurological:  Positive for headaches. Negative for dizziness, weakness and light-headedness.  Psychiatric/Behavioral:  Negative for confusion and dysphoric mood.        Objective:   Physical Exam Constitutional:      General: She is not in acute distress.    Appearance: Normal appearance. She is well-developed. She is not ill-appearing, toxic-appearing or diaphoretic.  HENT:     Head: Normocephalic and atraumatic.     Comments: Nares are injected and boggy    Right Ear: Tympanic membrane, ear canal and external ear normal.     Left Ear: Tympanic membrane, ear canal and external ear normal.     Nose: Congestion and rhinorrhea present.     Mouth/Throat:     Mouth: Mucous membranes are moist.     Pharynx: Oropharynx is clear. No oropharyngeal exudate or posterior oropharyngeal erythema.     Comments: Clear pnd  Eyes:     General:        Right eye: No discharge.        Left eye: No discharge.     Conjunctiva/sclera: Conjunctivae normal.     Pupils: Pupils are equal, round, and reactive to light.  Cardiovascular:     Rate and Rhythm: Normal rate.  Heart sounds: Normal  heart sounds.  Pulmonary:     Effort: Pulmonary effort is normal. No respiratory distress.     Breath sounds: Normal breath sounds. No stridor. No wheezing, rhonchi or rales.     Comments: Good air exch  Harsh bs at bases No wheeze or rhonchi  Chest:     Chest wall: No tenderness.  Musculoskeletal:     Cervical back: Normal range of motion and neck supple.  Lymphadenopathy:     Cervical: No cervical adenopathy.  Skin:    General: Skin is warm and dry.     Capillary Refill: Capillary refill takes less than 2 seconds.     Findings: No rash.  Neurological:     Mental Status: She is alert.     Cranial Nerves: No cranial nerve deficit.  Psychiatric:        Mood and Affect: Mood normal.           Assessment & Plan:   Problem List Items Addressed This Visit       Respiratory   Viral URI with cough - Primary    With primarily cough/chest congestion  Suspect viral  Reassuring exam Disc symptom control- with expectorant/DM  Fluids  Some findings of underlying chronic lung dz on cxr today -would consider pulmonary eval Per chart pt smoked many years ago Watch for wheeze or sob  ER precautions noted Update if not starting to improve in a week or if worsening        Relevant Orders   DG Chest 2 View (Completed)

## 2022-04-16 NOTE — Assessment & Plan Note (Signed)
With primarily cough/chest congestion  Suspect viral  Reassuring exam Disc symptom control- with expectorant/DM  Fluids  Some findings of underlying chronic lung dz on cxr today -would consider pulmonary eval Per chart pt smoked many years ago Watch for wheeze or sob  ER precautions noted Update if not starting to improve in a week or if worsening

## 2022-04-16 NOTE — Telephone Encounter (Signed)
LVM

## 2022-04-17 ENCOUNTER — Telehealth: Payer: Self-pay | Admitting: Cardiovascular Disease

## 2022-04-17 NOTE — Telephone Encounter (Signed)
Patient saw Rockey Situ on 04/09/2022

## 2022-04-17 NOTE — Telephone Encounter (Signed)
Patient states she is returning a call from yesterday.

## 2022-04-17 NOTE — Telephone Encounter (Signed)
Left message to call back  

## 2022-04-19 ENCOUNTER — Telehealth: Payer: Self-pay | Admitting: Family Medicine

## 2022-04-19 DIAGNOSIS — R9389 Abnormal findings on diagnostic imaging of other specified body structures: Secondary | ICD-10-CM

## 2022-04-19 NOTE — Telephone Encounter (Signed)
Patient called in to let Dr tower know that she would like to be referred  to a pulmonary specialist  now. She would like to see someone in .

## 2022-04-19 NOTE — Telephone Encounter (Signed)
I put the referral in  Please let us know if you don't hear in 1-2 weeks

## 2022-04-22 ENCOUNTER — Telehealth: Payer: Self-pay | Admitting: Family Medicine

## 2022-04-22 NOTE — Telephone Encounter (Signed)
Patient daughter Jenna Young called in and had some questions regarding the chest xray results. She can be reached ([phone]. Thank you!

## 2022-04-22 NOTE — Telephone Encounter (Signed)
I think she would tolerate DM products but avoid "D" -like pseudo ephedrine which does inc bp  Chlorcedin hbp is ok-it is for congestion (not cough)  Tussin is the expectorant without DM if she wants to avoid DM    Keep up fluids Not sure what kind of lung issue is going on right now which Is why I referred to specialist  Not sure if the xray finding is significant currently or not but wanted to check out further (and the past smoking and radiation could cause it) Also unsure if it is causing her current symptoms   Please follow up for a visit next week to talk further and re check her (while waiting for the pulm appt)

## 2022-04-22 NOTE — Telephone Encounter (Signed)
Daughter is very concerned about her mom if possible she wanted PCP to call her back to answer some questions.   They have a pulmonary appt scheduled for 05/13/22 but daughter has been "googling" chronic lung disease and is very concerned about her mom. Pt has mold in home now they are taking care of, she use to smoke and also has had radiation from cancer in the past all which said can cause this issues on google. Daughter also said that pt was advised by PCP use mucinex DM or robitussin DM over the counter for cough but daughter said given heart issues and BP issues she though pt was suppose to take coricidin or Tussin.   Daughter is anxious and worried and would like a call back on [phone] to discuss this

## 2022-04-23 NOTE — Telephone Encounter (Signed)
Daughter notified of Dr. Marliss Coots comments and verbalized understanding. Daughter will wait for pulmonary appt but if sxs worsen in the mean time she will call back and get an appt with Dr. Glori Bickers

## 2022-04-23 NOTE — Telephone Encounter (Signed)
I cannot find where anyone from cardiology tried to call the patient on 04/16/22. She was recently contacted for a pulmonary appointment and it may have been the Pulmonary office trying to reach her.  We have received no further call back from the patient.   Closing this encounter.

## 2022-05-01 ENCOUNTER — Telehealth: Payer: Self-pay | Admitting: Family Medicine

## 2022-05-01 NOTE — Telephone Encounter (Signed)
Called patient to schedule Medicare Annual Wellness Visit (AWV). Left message for patient to call back and schedule Medicare Annual Wellness Visit (AWV).I left a detild message informing patient that I had to re-schedule her appointment on 05/13/2022 to 05/23/2022   Re-scheduled : 05/13/2022 to 05/23/2022  Re-scheduled AWV to: 05/23/2022.  If any questions, please contact me at [phone].  Thank you ,  [place] Direct Dial: [phone]

## 2022-05-09 ENCOUNTER — Ambulatory Visit (INDEPENDENT_AMBULATORY_CARE_PROVIDER_SITE_OTHER): Payer: Medicare Other

## 2022-05-09 DIAGNOSIS — E538 Deficiency of other specified B group vitamins: Secondary | ICD-10-CM

## 2022-05-09 MED ORDER — CYANOCOBALAMIN 1000 MCG/ML IJ SOLN
1000.0000 ug | Freq: Once | INTRAMUSCULAR | Status: AC
  Administered 2022-05-09: 1000 ug via INTRAMUSCULAR

## 2022-05-09 NOTE — Progress Notes (Signed)
Per orders of Dr. Duncan, injection of vit B12 given by Argusta Mcgann. Patient tolerated injection well.  

## 2022-05-12 ENCOUNTER — Other Ambulatory Visit: Payer: Self-pay | Admitting: Cardiovascular Disease

## 2022-05-13 ENCOUNTER — Ambulatory Visit (INDEPENDENT_AMBULATORY_CARE_PROVIDER_SITE_OTHER): Payer: Medicare Other | Admitting: Student in an Organized Health Care Education/Training Program

## 2022-05-13 ENCOUNTER — Encounter: Payer: Self-pay | Admitting: Student in an Organized Health Care Education/Training Program

## 2022-05-13 VITALS — BP 130/78 | HR 74 | Temp 98.4°F | Ht 61.0 in | Wt 123.2 lb

## 2022-05-13 DIAGNOSIS — J849 Interstitial pulmonary disease, unspecified: Secondary | ICD-10-CM | POA: Diagnosis not present

## 2022-05-13 DIAGNOSIS — R0602 Shortness of breath: Secondary | ICD-10-CM | POA: Diagnosis not present

## 2022-05-13 NOTE — Progress Notes (Signed)
Synopsis: Referred in for ILD by Tower, Jenna Fanny, MD  Assessment & Plan:   1. Shortness of breath 2. ILD (interstitial lung disease) ([place])  Patient is presenting for the evaluation of abnormal findings on her chest xray. She has a chest CT from 2021 with findings of subpleural reticulation that was not investigated at the time. The combination of said finding and CXR finding are certainly concerning for ILD. I did discuss this at length with the patient and her daughter. Given her mold exposure, hypersensitivity pneumonitis is highest on the differential. I will obtain a high resolution chest CT to better visualize said findings. We did discuss that given she is asymptomatic, the risks of procedural interventions (transbronchial biopsy, surgical biopsy) likely outweighs any benefit at this moment. Should there be findings of active inflammation (such as ground glass opacities), I would consider providing the patient with a short course of steroids. Finally, we did discuss that mold remediation would be paramount in this situation.  - CT CHEST HIGH RESOLUTION; Future  Return in about 4 weeks (around 06/10/2022).  I spent 45 minutes caring for this patient today, including preparing to see the patient, obtaining a medical history , reviewing a separately obtained history, performing a medically appropriate examination and/or evaluation, counseling and educating the patient/family/caregiver, ordering medications, tests, or procedures, documenting clinical information in the electronic health record, and independently interpreting results (not separately reported/billed) and communicating results to the patient/family/caregiver  Armando Reichert, MD [facility] 05/13/2022 12:05 PM    End of visit medications:  No orders of the defined types were placed in this encounter.    Current Outpatient Medications:    carvedilol (COREG) 3.125 MG tablet, TAKE 1 TABLET(3.125 MG) BY MOUTH  TWICE DAILY, Disp: 60 tablet, Rfl: 11   Cholecalciferol (VITAMIN D) 2000 UNITS CAPS, Take 1 capsule by mouth daily., Disp: , Rfl:    clopidogrel (PLAVIX) 75 MG tablet, TAKE 1 TABLET(75 MG) BY MOUTH DAILY WITH BREAKFAST, crush pill if unable to swallow, Disp: 90 tablet, Rfl: 3   cyanocobalamin (,VITAMIN B-12,) 1000 MCG/ML injection, Inject 1,000 mcg into the muscle every 30 (thirty) days., Disp: , Rfl:    docusate sodium (COLACE) 100 MG capsule, Take 2 capsules (200 mg total) by mouth 2 (two) times daily., Disp: 10 capsule, Rfl: 0   losartan (COZAAR) 100 MG tablet, TAKE 1 TABLET(100 MG) BY MOUTH DAILY, Disp: 90 tablet, Rfl: 2   meclizine (ANTIVERT) 25 MG tablet, Take 25 mg by mouth 2 (two) times daily as needed., Disp: , Rfl:    Multiple Vitamin (MULTIVITAMIN WITH MINERALS) TABS tablet, Take 1 tablet by mouth daily., Disp: 30 tablet, Rfl: 1   nitroGLYCERIN (NITROSTAT) 0.4 MG SL tablet, PLACE 1 TABLET UNDER TONGUE EVERY 5 MIN AS NEEDED FOR CHEST PAIN IF NO RELIEF IN15 MIN CALL 911 (MAX 3 TABS), Disp: 25 tablet, Rfl: 1   omeprazole (PRILOSEC) 20 MG capsule, TAKE 1 CAPSULE(20 MG) BY MOUTH DAILY AS NEEDED, Disp: 90 capsule, Rfl: 3   rosuvastatin (CRESTOR) 10 MG tablet, TAKE 1 TABLET(10 MG) BY MOUTH DAILY, Disp: 90 tablet, Rfl: 2   Skin Protectants, Misc. (EUCERIN) cream, Apply 1 application. topically as needed for dry skin., Disp: , Rfl:    Subjective:   PATIENT ID: Jenna Young GENDER: female DOB: 1932-01-11, MRN: [id number]  Chief Complaint  Patient presents with   pulmonary consult    CXR 04/16/2022    HPI  Patient is a pleasant 89 year old  female presenting to clinic for the evaluation of abnormal findings on her chest xray.  Patient is currently asymptomatic aside from a mild cough that has improved compared to prior. The cough had started a couple of months ago around the time of an URTI and has improved. She has no shortness of breath, no chest tightness, no wheezing, no fevers, no  chills, and no sputum production. She feels she is in her usual state of health. She is not limited in her activity and is able to do all the things she likes to do. She was previously undergoing physical therapy (after a hip fracture) with no limitation.  Patient was seen by her primary care physician for her cough, and a CXR was ordered. This was noted to have signs of underlying chronic lung disease. On my review of her chest CT, there was also subpleural reticulation on a previous chest CT.  Patient previously worked as a Secretary/administrator. She worked briefly in a Web [employer] a long time ago. She is a non-smoker (smoked for 10 years in the 68's). She does report finding mold at their house, which her son and grandson are currently working on fixing. They did have a mold specialist see the house, but professional remediation will be very expensive. She has no pets, but previously always had dogs.  Ancillary information including prior medications, full medical/surgical/family/social histories, and PFTs (when available) are listed below and have been reviewed.   Review of Systems  Constitutional:  Negative for chills, fever, malaise/fatigue and weight loss.  Respiratory:  Positive for cough. Negative for hemoptysis, sputum production, shortness of breath and wheezing.   Cardiovascular:  Negative for chest pain.  Skin:  Negative for rash.     Objective:   Vitals:   05/13/22 1101  BP: 130/78  Pulse: 74  Temp: 98.4 F (36.9 C)  TempSrc: Temporal  SpO2: 97%  Weight: 123 lb 3.2 oz (55.9 kg)  Height: '5\' 1"'$  (1.549 m)   97% on RA  BMI Readings from Last 3 Encounters:  05/13/22 23.28 kg/m  04/16/22 23.24 kg/m  04/09/22 22.96 kg/m   Wt Readings from Last 3 Encounters:  05/13/22 123 lb 3.2 oz (55.9 kg)  04/16/22 123 lb (55.8 kg)  04/09/22 121 lb 8 oz (55.1 kg)    Physical Exam Constitutional:      Appearance: Normal appearance. She is not ill-appearing.  HENT:     Head:  Normocephalic.     Nose: Nose normal.     Mouth/Throat:     Mouth: Mucous membranes are moist.  Cardiovascular:     Rate and Rhythm: Normal rate and regular rhythm.     Pulses: Normal pulses.     Heart sounds: Normal heart sounds.  Pulmonary:     Effort: Pulmonary effort is normal.     Breath sounds: Rales (faint bibasilar rales) present.  Abdominal:     Palpations: Abdomen is soft.  Musculoskeletal:     Cervical back: Neck supple.  Neurological:     General: No focal deficit present.     Mental Status: She is alert and oriented to person, place, and time. Mental status is at baseline.     Ancillary Information    Past Medical History:  Diagnosis Date   Allergic rhinitis    Anemia    Anxiety    GERD (gastroesophageal reflux disease)    History of radiation therapy 04/29/16- 05/24/16   Left Breast 40.05 Gy in 15 fractions, Left Breast boost  10 Gy in 5 fractions.    Hyperlipidemia    Hypertension    treated by Dr Thurnell Garbe recently with syncope episode   Osteopenia    Osteoporosis    Personal history of radiation therapy 2018     Family History  Problem Relation Age of Onset   Hypertension Mother    Heart disease Father    Macular degeneration Sister    Diabetes Maternal Aunt    Cancer Maternal Aunt        throat     Past Surgical History:  Procedure Laterality Date   APPENDECTOMY     BREAST BIOPSY Left 11/29/2008   BREAST BIOPSY Left 01/26/2016   malignant   BREAST EXCISIONAL BIOPSY Left 12/26/2008   BREAST LUMPECTOMY Left 02/27/2016   BREAST LUMPECTOMY Left 04/01/2016   BREAST LUMPECTOMY WITH RADIOACTIVE SEED LOCALIZATION Left 02/27/2016   Procedure: LEFT BREAST LUMPECTOMY WITH RADIOACTIVE SEED LOCALIZATION;  Surgeon: Autumn Messing III, MD;  Location: [facility];  Service: General;  Laterality: Left;   BREAST SURGERY  10/10   benign biopsy  negative   CESAREAN SECTION     CHOLECYSTECTOMY     CORONARY STENT INTERVENTION N/A 02/11/2017    Procedure: CORONARY STENT INTERVENTION;  Surgeon: Nelva Bush, MD;  Location: [facility];  Service: Cardiovascular;  Laterality: N/A;   FEMUR FRACTURE SURGERY Right 2006   HIP ARTHROPLASTY Left 06/28/2021   Procedure: ARTHROPLASTY BIPOLAR HIP (HEMIARTHROPLASTY);  Surgeon: Renee Harder, MD;  Location: [facility];  Service: Orthopedics;  Laterality: Left;   LEFT HEART CATH AND CORONARY ANGIOGRAPHY N/A 02/11/2017   Procedure: LEFT HEART CATH AND CORONARY ANGIOGRAPHY;  Surgeon: Nelva Bush, MD;  Location: [facility];  Service: Cardiovascular;  Laterality: N/A;   LEFT HEART CATH AND CORONARY ANGIOGRAPHY N/A 05/21/2019   Procedure: LEFT HEART CATH AND CORONARY ANGIOGRAPHY;  Surgeon: Wellington Hampshire, MD;  Location: [facility];  Service: Cardiovascular;  Laterality: N/A;   OVARIAN CYST SURGERY     RE-EXCISION OF BREAST LUMPECTOMY Left 04/01/2016   Procedure: RE-EXCISION OF LEFT BREAST MEDIAL MARGIN;  Surgeon: Autumn Messing III, MD;  Location: [facility];  Service: General;  Laterality: Left;    Social History   Socioeconomic History   Marital status: Widowed    Spouse name: Not on file   Number of children: 1   Years of education: Not on file   Highest education level: Not on file  Occupational History    Employer: RETIRED  Tobacco Use   Smoking status: Former    Packs/day: 1.00    Years: 10.00    Total pack years: 10.00    Types: Cigarettes    Quit date: 03/11/1968    Years since quitting: 54.2   Smokeless tobacco: Never  Vaping Use   Vaping Use: Never used  Substance and Sexual Activity   Alcohol use: No    Alcohol/week: 0.0 standard drinks of alcohol   Drug use: No   Sexual activity: Not Currently  Other Topics Concern   Not on file  Social History Narrative   Not on file   Social Determinants of Health   Financial Resource Strain: Low Risk  (05/10/2021)   Overall Financial Resource Strain (CARDIA)    Difficulty of Paying  Living Expenses: Not hard at all  Food Insecurity: No Food Insecurity (05/10/2021)   Hunger Vital Sign    Worried About Running Out of Food in the Last Year: Never true  Ran Out of Food in the Last Year: Never true  Transportation Needs: No Transportation Needs (05/10/2021)   PRAPARE - Hydrologist (Medical): No    Lack of Transportation (Non-Medical): No  Physical Activity: Insufficiently Active (05/10/2021)   Exercise Vital Sign    Days of Exercise per Week: 2 days    Minutes of Exercise per Session: 10 min  Stress: No Stress Concern Present (05/10/2021)   [place]    Feeling of Stress : Not at all  Social Connections: Moderately Integrated (05/10/2021)   Social Connection and Isolation Panel [NHANES]    Frequency of Communication with Friends and Family: More than three times a week    Frequency of Social Gatherings with Friends and Family: Never    Attends Religious Services: More than 4 times per year    Active Member of [employer] or Organizations: Yes    Attends Archivist Meetings: More than 4 times per year    Marital Status: Widowed  Intimate Partner Violence: Not At Risk (05/10/2021)   Humiliation, Afraid, Rape, and Kick questionnaire    Fear of Current or Ex-Partner: No    Emotionally Abused: No    Physically Abused: No    Sexually Abused: No     Allergies  Allergen Reactions   Buspirone Hcl     REACTION: HEART PALPITATIONS   Codeine     REACTION: nausea and vomiting   Hydrochlorothiazide     REACTION: syncope-possibly from dehydration   Iron     Oral iron -per pt made her "pass out"    Isosorbide Nitrate Other (See Comments)    hypotension   Lansoprazole     REACTION: abd pain   Minocycline Hcl    Oxycodone Other (See Comments)    Hallucinations    Penicillins     REACTION: mouth numbness Has patient had a PCN reaction causing immediate rash, facial/tongue/throat  swelling, SOB or lightheadedness with hypotension: No Has patient had a PCN reaction causing severe rash involving mucus membranes or skin necrosis: No Has patient had a PCN reaction that required hospitalization: No Has patient had a PCN reaction occurring within the last 10 years: No If all of the above answers are "NO", then may proceed with Cephalosporin use.   Phenergan [Promethazine Hcl] Other (See Comments)    Body shakes, hallucinations   Ramipril     Cough    Ranexa [Ranolazine]     Constipation    Sulfa Antibiotics Nausea Only   Rosuvastatin     Patient reports muscle pain, unable to take. Stopped around 07/22.     CBC    Component Value Date/Time   WBC 9.2 06/28/2021 0552   RBC 3.86 (L) 06/28/2021 0552   HGB 9.3 (L) 06/29/2021 1009   HGB 9.1 (L) 05/31/2019 1157   HCT 31.3 (L) 06/28/2021 0552   HCT 30.1 (L) 05/31/2019 1157   PLT 155 06/28/2021 0552   PLT 195 05/31/2019 1157   MCV 81.1 06/28/2021 0552   MCV 79 05/31/2019 1157   MCV 80 07/27/2013 1318   MCH 26.4 06/28/2021 0552   MCHC 32.6 06/28/2021 0552   RDW 15.7 (H) 06/28/2021 0552   RDW 18.8 (H) 05/31/2019 1157   RDW 17.9 (H) 07/27/2013 1318   LYMPHSABS 1.6 06/27/2021 1655   LYMPHSABS 2.0 05/31/2019 1157   LYMPHSABS 3.1 07/27/2013 1318   MONOABS 0.5 06/27/2021 1655   MONOABS 0.5 07/27/2013 1318  EOSABS 0.1 06/27/2021 1655   EOSABS 0.1 05/31/2019 1157   EOSABS 0.1 07/27/2013 1318   BASOSABS 0.0 06/27/2021 1655   BASOSABS 0.1 05/31/2019 1157   BASOSABS 0.1 07/27/2013 1318    Pulmonary Functions Testing Results:     No data to display          Outpatient Medications Prior to Visit  Medication Sig Dispense Refill   carvedilol (COREG) 3.125 MG tablet TAKE 1 TABLET(3.125 MG) BY MOUTH TWICE DAILY 60 tablet 11   Cholecalciferol (VITAMIN D) 2000 UNITS CAPS Take 1 capsule by mouth daily.     clopidogrel (PLAVIX) 75 MG tablet TAKE 1 TABLET(75 MG) BY MOUTH DAILY WITH BREAKFAST, crush pill if unable to  swallow 90 tablet 3   cyanocobalamin (,VITAMIN B-12,) 1000 MCG/ML injection Inject 1,000 mcg into the muscle every 30 (thirty) days.     docusate sodium (COLACE) 100 MG capsule Take 2 capsules (200 mg total) by mouth 2 (two) times daily. 10 capsule 0   losartan (COZAAR) 100 MG tablet TAKE 1 TABLET(100 MG) BY MOUTH DAILY 90 tablet 2   meclizine (ANTIVERT) 25 MG tablet Take 25 mg by mouth 2 (two) times daily as needed.     Multiple Vitamin (MULTIVITAMIN WITH MINERALS) TABS tablet Take 1 tablet by mouth daily. 30 tablet 1   nitroGLYCERIN (NITROSTAT) 0.4 MG SL tablet PLACE 1 TABLET UNDER TONGUE EVERY 5 MIN AS NEEDED FOR CHEST PAIN IF NO RELIEF IN15 MIN CALL 911 (MAX 3 TABS) 25 tablet 1   omeprazole (PRILOSEC) 20 MG capsule TAKE 1 CAPSULE(20 MG) BY MOUTH DAILY AS NEEDED 90 capsule 3   rosuvastatin (CRESTOR) 10 MG tablet TAKE 1 TABLET(10 MG) BY MOUTH DAILY 90 tablet 2   Skin Protectants, Misc. (EUCERIN) cream Apply 1 application. topically as needed for dry skin.     No facility-administered medications prior to visit.

## 2022-05-16 ENCOUNTER — Telehealth: Payer: Self-pay | Admitting: Cardiovascular Disease

## 2022-05-16 MED ORDER — LOSARTAN POTASSIUM 100 MG PO TABS: ORAL_TABLET | ORAL | 3 refills | Status: DC

## 2022-05-16 NOTE — Telephone Encounter (Signed)
Refills for Losartan has been sent to [facility]. I called the patient to let her know that her PCP sent in a prescription for a year of Clopidogrel in 11/2021 and that she should have enough until 11/2022. I let the patient know that I could call the pharmacy to make sure. I called the pharmacy and the pharmacy states that there are refills available and the the prescription will be ready for pick up in a hour and a half. I called the patient back to relay the information. Patient verbalized understanding.

## 2022-05-16 NOTE — Telephone Encounter (Signed)
*  STAT* If patient is at the pharmacy, call can be transferred to refill team.   1. Which medications need to be refilled? (please list name of each medication and dose if known) losartan (COZAAR) 100 MG tablet TAKE 1 TABLET(100 MG) BY MOUTH DAILY and   clopidogrel (PLAVIX) 75 MG tablet  TAKE 1 TABLET(75 MG) BY MOUTH DAILY WITH BREAKFAST, crush pill if unable to swallow    2. Which pharmacy/location (including street and city if local pharmacy) is medication to be sent to?[facility] [place], [place]   3. Do they need a 30 day or 90 day supply? 90 Day Supply for both medications

## 2022-05-17 ENCOUNTER — Encounter: Payer: Self-pay | Admitting: Oncology

## 2022-05-22 ENCOUNTER — Ambulatory Visit
Admission: RE | Admit: 2022-05-22 | Discharge: 2022-05-22 | Disposition: A | Discharge status: Home / Self Care | Payer: Medicare Other | Source: Ambulatory Visit | Attending: Student in an Organized Health Care Education/Training Program | Admitting: Student in an Organized Health Care Education/Training Program

## 2022-05-22 DIAGNOSIS — J479 Bronchiectasis, uncomplicated: Secondary | ICD-10-CM | POA: Diagnosis not present

## 2022-05-22 DIAGNOSIS — J439 Emphysema, unspecified: Secondary | ICD-10-CM | POA: Diagnosis not present

## 2022-05-22 DIAGNOSIS — R0602 Shortness of breath: Secondary | ICD-10-CM | POA: Diagnosis not present

## 2022-05-23 ENCOUNTER — Ambulatory Visit (INDEPENDENT_AMBULATORY_CARE_PROVIDER_SITE_OTHER): Payer: Medicare Other

## 2022-05-23 VITALS — Ht 61.0 in | Wt 123.0 lb

## 2022-05-23 DIAGNOSIS — Z0000 Encounter for general adult medical examination without abnormal findings: Secondary | ICD-10-CM

## 2022-05-23 NOTE — Progress Notes (Signed)
I connected with  Angelene Quizon Patchin on 05/23/22 by a audio enabled telemedicine application and verified that I am speaking with the correct person using two identifiers.  Patient Location: Home  Provider Location: Home Office  I discussed the limitations of evaluation and management by telemedicine. The patient expressed understanding and agreed to proceed.  Subjective:   Jenna Young is a 89 y.o. female who presents for Medicare Annual (Subsequent) preventive examination.  Review of Systems      Cardiac Risk Factors include: advanced age (>54mn, >>46women);hypertension;sedentary lifestyle     Objective:    Today's Vitals   05/23/22 1309  Weight: 123 lb (55.8 kg)  Height: '5\' 1"'$  (1.549 m)   Body mass index is 23.24 kg/m.     05/23/2022    1:25 PM 06/28/2021    2:53 PM 06/27/2021    9:48 PM 06/27/2021    3:16 PM 05/10/2021    9:01 AM 05/23/2020    1:09 PM 05/09/2020    8:13 AM  Advanced Directives  Does Patient Have a Medical Advance Directive? No No No No No No No  Does patient want to make changes to medical advance directive?     Yes (MAU/Ambulatory/Procedural Areas - Information given)    Would patient like information on creating a medical advance directive? No - Patient declined No - Patient declined No - Patient declined    No - Patient declined    Current Medications (verified) Outpatient Encounter Medications as of 05/23/2022  Medication Sig   carvedilol (COREG) 3.125 MG tablet TAKE 1 TABLET(3.125 MG) BY MOUTH TWICE DAILY   Cholecalciferol (VITAMIN D) 2000 UNITS CAPS Take 1 capsule by mouth daily.   clopidogrel (PLAVIX) 75 MG tablet TAKE 1 TABLET(75 MG) BY MOUTH DAILY WITH BREAKFAST, crush pill if unable to swallow   cyanocobalamin (,VITAMIN B-12,) 1000 MCG/ML injection Inject 1,000 mcg into the muscle every 30 (thirty) days.   docusate sodium (COLACE) 100 MG capsule Take 2 capsules (200 mg total) by mouth 2 (two) times daily.   losartan (COZAAR) 100 MG  tablet TAKE 1 TABLET(100 MG) BY MOUTH DAILY   nitroGLYCERIN (NITROSTAT) 0.4 MG SL tablet PLACE 1 TABLET UNDER TONGUE EVERY 5 MIN AS NEEDED FOR CHEST PAIN IF NO RELIEF IN15 MIN CALL 911 (MAX 3 TABS)   omeprazole (PRILOSEC) 20 MG capsule TAKE 1 CAPSULE(20 MG) BY MOUTH DAILY AS NEEDED   rosuvastatin (CRESTOR) 10 MG tablet TAKE 1 TABLET(10 MG) BY MOUTH DAILY   Skin Protectants, Misc. (EUCERIN) cream Apply 1 application. topically as needed for dry skin.   meclizine (ANTIVERT) 25 MG tablet Take 25 mg by mouth 2 (two) times daily as needed. (Patient not taking: Reported on 05/23/2022)   Multiple Vitamin (MULTIVITAMIN WITH MINERALS) TABS tablet Take 1 tablet by mouth daily. (Patient not taking: Reported on 05/23/2022)   No facility-administered encounter medications on file as of 05/23/2022.    Allergies (verified) Buspirone hcl, Codeine, Hydrochlorothiazide, Iron, Isosorbide nitrate, Lansoprazole, Minocycline hcl, Oxycodone, Penicillins, Phenergan [promethazine hcl], Ramipril, Ranexa [ranolazine], Sulfa antibiotics, and Rosuvastatin   History: Past Medical History:  Diagnosis Date   Allergic rhinitis    Anemia    Anxiety    GERD (gastroesophageal reflux disease)    History of radiation therapy 04/29/16- 05/24/16   Left Breast 40.05 Gy in 15 fractions, Left Breast boost 10 Gy in 5 fractions.    Hyperlipidemia    Hypertension    treated by Dr GThurnell Garberecently with syncope episode  Osteopenia    Osteoporosis    Personal history of radiation therapy 2018   Past Surgical History:  Procedure Laterality Date   APPENDECTOMY     BREAST BIOPSY Left 11/29/2008   BREAST BIOPSY Left 01/26/2016   malignant   BREAST EXCISIONAL BIOPSY Left 12/26/2008   BREAST LUMPECTOMY Left 02/27/2016   BREAST LUMPECTOMY Left 04/01/2016   BREAST LUMPECTOMY WITH RADIOACTIVE SEED LOCALIZATION Left 02/27/2016   Procedure: LEFT BREAST LUMPECTOMY WITH RADIOACTIVE SEED LOCALIZATION;  Surgeon: Autumn Messing III, MD;   Location: [facility];  Service: General;  Laterality: Left;   BREAST SURGERY  10/10   benign biopsy  negative   CESAREAN SECTION     CHOLECYSTECTOMY     CORONARY STENT INTERVENTION N/A 02/11/2017   Procedure: CORONARY STENT INTERVENTION;  Surgeon: Nelva Bush, MD;  Location: [facility];  Service: Cardiovascular;  Laterality: N/A;   FEMUR FRACTURE SURGERY Right 2006   HIP ARTHROPLASTY Left 06/28/2021   Procedure: ARTHROPLASTY BIPOLAR HIP (HEMIARTHROPLASTY);  Surgeon: Renee Harder, MD;  Location: [facility];  Service: Orthopedics;  Laterality: Left;   LEFT HEART CATH AND CORONARY ANGIOGRAPHY N/A 02/11/2017   Procedure: LEFT HEART CATH AND CORONARY ANGIOGRAPHY;  Surgeon: Nelva Bush, MD;  Location: [facility];  Service: Cardiovascular;  Laterality: N/A;   LEFT HEART CATH AND CORONARY ANGIOGRAPHY N/A 05/21/2019   Procedure: LEFT HEART CATH AND CORONARY ANGIOGRAPHY;  Surgeon: Wellington Hampshire, MD;  Location: [facility];  Service: Cardiovascular;  Laterality: N/A;   OVARIAN CYST SURGERY     RE-EXCISION OF BREAST LUMPECTOMY Left 04/01/2016   Procedure: RE-EXCISION OF LEFT BREAST MEDIAL MARGIN;  Surgeon: Autumn Messing III, MD;  Location: [facility];  Service: General;  Laterality: Left;   Family History  Problem Relation Age of Onset   Hypertension Mother    Heart disease Father    Macular degeneration Sister    Diabetes Maternal Aunt    Cancer Maternal Aunt        throat   Social History   Socioeconomic History   Marital status: Widowed    Spouse name: Not on file   Number of children: 1   Years of education: Not on file   Highest education level: Not on file  Occupational History    Employer: RETIRED  Tobacco Use   Smoking status: Former    Packs/day: 1.00    Years: 10.00    Additional pack years: 0.00    Total pack years: 10.00    Types: Cigarettes    Quit date: 03/11/1968    Years since quitting: 54.2   Smokeless  tobacco: Never  Vaping Use   Vaping Use: Never used  Substance and Sexual Activity   Alcohol use: No    Alcohol/week: 0.0 standard drinks of alcohol   Drug use: No   Sexual activity: Not Currently  Other Topics Concern   Not on file  Social History Narrative   Not on file   Social Determinants of Health   Financial Resource Strain: Low Risk  (05/23/2022)   Overall Financial Resource Strain (CARDIA)    Difficulty of Paying Living Expenses: Not hard at all  Food Insecurity: No Food Insecurity (05/23/2022)   Hunger Vital Sign    Worried About Running Out of Food in the Last Year: Never true    Ran Out of Food in the Last Year: Never true  Transportation Needs: No Transportation Needs (05/23/2022)   PRAPARE - Transportation  Lack of Transportation (Medical): No    Lack of Transportation (Non-Medical): No  Physical Activity: Insufficiently Active (05/23/2022)   Exercise Vital Sign    Days of Exercise per Week: 4 days    Minutes of Exercise per Session: 20 min  Stress: No Stress Concern Present (05/23/2022)   [place]    Feeling of Stress : Not at all  Social Connections: Unknown (05/23/2022)   Social Connection and Isolation Panel [NHANES]    Frequency of Communication with Friends and Family: More than three times a week    Frequency of Social Gatherings with Friends and Family: More than three times a week    Attends Religious Services: Patient unable to answer    Active Member of Clubs or Organizations: No    Attends Archivist Meetings: Never    Marital Status: Widowed    Tobacco Counseling Counseling given: Not Answered   Clinical Intake:  Pre-visit preparation completed: Yes  Pain : No/denies pain     Nutritional Risks: None Diabetes: No  How often do you need to have someone help you when you read instructions, pamphlets, or other written materials from your doctor or pharmacy?: 1 -  Never  Diabetic? no  Interpreter Needed?: No  Information entered by :: C.Jakelin Taussig LPN   Activities of Daily Living    05/23/2022    1:26 PM 06/27/2021    9:56 PM  In your present state of health, do you have any difficulty performing the following activities:  Hearing? 1   Comment wears aids   Vision? 0   Difficulty concentrating or making decisions? 0   Walking or climbing stairs? 1   Comment S/P hip fx   Dressing or bathing? 0   Doing errands, shopping? 0 0  Preparing Food and eating ? N   Using the Toilet? N   In the past six months, have you accidently leaked urine? N   Do you have problems with loss of bowel control? N   Managing your Medications? N   Managing your Finances? N   Housekeeping or managing your Housekeeping? N     Patient Care Team: Tower, [name], MD as PCP - General Rockey Situ, Kathlene November, MD as PCP - Cardiology (Cardiology) Lloyd Huger, MD as Consulting Physician (Oncology) Charlton Haws, [facility] as Pharmacist (Pharmacist)  Indicate any recent Medical Services you may have received from other than [facility] providers in the past year (date may be approximate).     Assessment:   This is a routine wellness examination for Aaliyah.  Hearing/Vision screen Hearing Screening - Comments:: aids Vision Screening - Comments:: No glasses - Dr.Nice  Dietary issues and exercise activities discussed: Current Exercise Habits: Home exercise routine, Type of exercise: strength training/weights;stretching (PT exercises), Time (Minutes): 20, Frequency (Times/Week): 4, Weekly Exercise (Minutes/Week): 80, Intensity: Mild, Exercise limited by: None identified   Goals Addressed             This Visit's Progress    Patient Stated       Continue to be able to walk.       Depression Screen    05/23/2022    1:23 PM 04/16/2022    3:58 PM 05/10/2021    9:04 AM 05/09/2020    8:14 AM 09/20/2019    2:42 PM 06/21/2019    2:50 PM 04/14/2019    2:13 PM  PHQ 2/9 Scores   PHQ - 2 Score 0 0  0 0 0 0 0  PHQ- 9 Score 0 0  0 1 1 0    Fall Risk    05/23/2022    1:26 PM 04/16/2022    3:58 PM 05/18/2021   10:23 AM 05/10/2021    9:03 AM 05/09/2020    8:14 AM  Fall Risk   Falls in the past year? 1 1 0 0 0  Number falls in past yr: 0 0 0 0 0  Comment under orth care      Injury with Fall? 1 1 0 0 0  Comment Fx left hip      Risk for fall due to : History of fall(s);Impaired balance/gait;Orthopedic patient History of fall(s) No Fall Risks No Fall Risks Medication side effect  Follow up Education provided;Falls prevention discussed;Falls evaluation completed Falls evaluation completed Falls evaluation completed Falls prevention discussed Falls evaluation completed;Falls prevention discussed    FALL RISK PREVENTION PERTAINING TO THE HOME:  Any stairs in or around the home? Yes  If so, are there any without handrails? No  Home free of loose throw rugs in walkways, pet beds, electrical cords, etc? Yes  Adequate lighting in your home to reduce risk of falls? Yes   ASSISTIVE DEVICES UTILIZED TO PREVENT FALLS:  Life alert? No  Use of a cane, walker or w/c? Yes  Grab bars in the bathroom? Yes  Shower chair or bench in shower? Yes  Elevated toilet seat or a handicapped toilet? Yes    Cognitive Function:    05/09/2020    8:17 AM 04/14/2019    2:15 PM 04/08/2018    9:21 AM 03/31/2017   10:40 AM 03/21/2016    8:58 AM  MMSE - Mini Mental State Exam  Orientation to time '5 5 5 5 5  '$ Orientation to Place '5 5 5 5 5  '$ Registration '3 3 3 3 3  '$ Attention/ Calculation 5 5 0 0 0  Recall '3 3 3 3 1  '$ Recall-comments     pt was unable to recall 2 of 3 words  Language- name 2 objects   0 0 0  Language- repeat '1 1 1 1 1  '$ Language- follow 3 step command   '3 3 3  '$ Language- read & follow direction   0 0 0  Write a sentence   0 0 0  Copy design   0 0 0  Total score   '20 20 18        '$ 05/23/2022    1:28 PM  6CIT Screen  What Year? 0 points  What month? 0 points  What time? 0  points  Count back from 20 0 points  Months in reverse 0 points  Repeat phrase 0 points  Total Score 0 points    Immunizations Immunization History  Administered Date(s) Administered   Fluad Quad(high Dose 65+) 12/29/2018, 12/11/2019, 01/12/2021, 11/19/2021   Influenza Split 12/11/2010, 01/17/2012   Influenza Whole 01/09/2006, 12/10/2006, 12/08/2007, 12/13/2009   Influenza,inj,Quad PF,6+ Mos 12/09/2012, 12/15/2013, 12/22/2014, 12/21/2015, 12/24/2016, 01/14/2018   PFIZER(Purple Top)SARS-COV-2 Vaccination 04/05/2019, 04/26/2019, 01/12/2020   Pneumococcal Conjugate-13 02/23/2014   Pneumococcal Polysaccharide-23 12/11/2010   Td 01/10/2008    TDAP status: Due, Education has been provided regarding the importance of this vaccine. Advised may receive this vaccine at local pharmacy or Health Dept. Aware to provide a copy of the vaccination record if obtained from local pharmacy or Health Dept. Verbalized acceptance and understanding.  Flu Vaccine status: Up to date  Pneumococcal vaccine status: Up to  date  Covid-19 vaccine status: Information provided on how to obtain vaccines.   Qualifies for Shingles Vaccine? Yes   Zostavax completed  unknown   Shingrix Completed?: No.    Education has been provided regarding the importance of this vaccine. Patient has been advised to call insurance company to determine out of pocket expense if they have not yet received this vaccine. Advised may also receive vaccine at local pharmacy or Health Dept. Verbalized acceptance and understanding.  Screening Tests Health Maintenance  Topic Date Due   Zoster Vaccines- Shingrix (1 of 2) Never done   DTaP/Tdap/Td (2 - Tdap) 01/09/2018   COVID-19 Vaccine (4 - 2023-24 season) 05/29/2022 (Originally 11/09/2021)   DEXA SCAN  03/21/2025 (Originally 01/11/1999)   MAMMOGRAM  01/15/2023   Medicare Annual Wellness (AWV)  05/23/2023   Pneumonia Vaccine 63+ Years old  Completed   INFLUENZA VACCINE  Completed   HPV  VACCINES  Aged Out   COLONOSCOPY (Pts 45-64yr Insurance coverage will need to be confirmed)  Discontinued    Health Maintenance  Health Maintenance Due  Topic Date Due   Zoster Vaccines- Shingrix (1 of 2) Never done   DTaP/Tdap/Td (2 - Tdap) 01/09/2018    Colorectal cancer screening: No longer required.   Mammogram status: Completed 01/14/2022. Repeat every year Pt will call to schedule.  Bone Density Scan - Pt declined  Lung Cancer Screening: (Low Dose CT Chest recommended if Age 87-80years, 30 pack-year currently smoking OR have quit w/in 15years.) does not qualify.   Lung Cancer Screening Referral: no  Additional Screening:  Hepatitis C Screening: does not qualify; Completed no  Vision Screening: Recommended annual ophthalmology exams for early detection of glaucoma and other disorders of the eye. Is the patient up to date with their annual eye exam?  No , has appointment 05/27/2022 Who is the provider or what is the name of the office in which the patient attends annual eye exams? Dr.Nice If pt is not established with a provider, would they like to be referred to a provider to establish care? No .   Dental Screening: Recommended annual dental exams for proper oral hygiene  Community Resource Referral / Chronic Care Management: CRR required this visit?  No   CCM required this visit?  No      Plan:     I have personally reviewed and noted the following in the patient's chart:   Medical and social history Use of alcohol, tobacco or illicit drugs  Current medications and supplements including opioid prescriptions. Patient is not currently taking opioid prescriptions. Functional ability and status Nutritional status Physical activity Advanced directives List of other physicians Hospitalizations, surgeries, and ER visits in previous 12 months Vitals Screenings to include cognitive, depression, and falls Referrals and appointments  In addition, I have reviewed  and discussed with patient certain preventive protocols, quality metrics, and best practice recommendations. A written personalized care plan for preventive services as well as general preventive health recommendations were provided to patient.     CLebron Conners LPN   [id number]  Nurse Notes: Pt declines shingles and COVID vaccines.

## 2022-05-23 NOTE — Patient Instructions (Signed)
Jenna Young , Thank you for taking time to come for your Medicare Wellness Visit. I appreciate your ongoing commitment to your health goals. Please review the following plan we discussed and let me know if I can assist you in the future.   These are the goals we discussed:  Goals      Increase physical activity     Starting 04/08/2018, I will continue to exercise for 60 minutes twice weekly.      Patient Stated     87/2021, I will maintain and continue medications as prescribed.      Patient Stated     87/03/2020, I will maintain and continue medications as prescribed.      Patient Stated     Would like to continue to exercise on stationary bike     Patient Stated     Continue to be able to walk.        This is a list of the screening recommended for you and due dates:  Health Maintenance  Topic Date Due   Zoster (Shingles) Vaccine (1 of 2) Never done   DTaP/Tdap/Td vaccine (2 - Tdap) 01/09/2018   COVID-19 Vaccine (4 - 2023-24 season) 05/29/2022*   DEXA scan (bone density measurement)  03/21/2025*   Mammogram  01/15/2023   Medicare Annual Wellness Visit  05/23/2023   Pneumonia Vaccine  Completed   Flu Shot  Completed   HPV Vaccine  Aged Out   Colon Cancer Screening  Discontinued  *Topic was postponed. The date shown is not the original due date.    Advanced directives: Advance directive discussed with you today. Even though you declined this today, please call our office should you change your mind, and we can give you the proper paperwork for you to fill out.   Conditions/risks identified: Aim for 30 minutes of exercise or brisk walking, 6-8 glasses of water, and 5 servings of fruits and vegetables each day.   Next appointment: Follow up in one year for your annual wellness visit 05/28/2023 @ 10:45 via telephone.   Preventive Care 87 Years and Older, Female Preventive care refers to lifestyle choices and visits with your health care provider that can promote health and  wellness. What does preventive care include? A yearly physical exam. This is also called an annual well check. Dental exams once or twice a year. Routine eye exams. Ask your health care provider how often you should have your eyes checked. Personal lifestyle choices, including: Daily care of your teeth and gums. Regular physical activity. Eating a healthy diet. Avoiding tobacco and drug use. Limiting alcohol use. Practicing safe sex. Taking low-dose aspirin every day. Taking vitamin and mineral supplements as recommended by your health care provider. What happens during an annual well check? The services and screenings done by your health care provider during your annual well check will depend on your age, overall health, lifestyle risk factors, and family history of disease. Counseling  Your health care provider may ask you questions about your: Alcohol use. Tobacco use. Drug use. Emotional well-being. Home and relationship well-being. Sexual activity. Eating habits. History of falls. Memory and ability to understand (cognition). Work and work Statistician. Reproductive health. Screening  You may have the following tests or measurements: Height, weight, and BMI. Blood pressure. Lipid and cholesterol levels. These may be checked every 5 years, or more frequently if you are over 79 years old. Skin check. Lung cancer screening. You may have this screening every year starting at age 18  if you have a 30-pack-year history of smoking and currently smoke or have quit within the past 15 years. Fecal occult blood test (FOBT) of the stool. You may have this test every year starting at age 46. Flexible sigmoidoscopy or colonoscopy. You may have a sigmoidoscopy every 5 years or a colonoscopy every 10 years starting at age 26. Hepatitis C blood test. Hepatitis B blood test. Sexually transmitted disease (STD) testing. Diabetes screening. This is done by checking your blood sugar (glucose)  after you have not eaten for a while (fasting). You may have this done every 1-3 years. Bone density scan. This is done to screen for osteoporosis. You may have this done starting at age 24. Mammogram. This may be done every 1-2 years. Talk to your health care provider about how often you should have regular mammograms. Talk with your health care provider about your test results, treatment options, and if necessary, the need for more tests. Vaccines  Your health care provider may recommend certain vaccines, such as: Influenza vaccine. This is recommended every year. Tetanus, diphtheria, and acellular pertussis (Tdap, Td) vaccine. You may need a Td booster every 10 years. Zoster vaccine. You may need this after age 81. Pneumococcal 13-valent conjugate (PCV13) vaccine. One dose is recommended after age 29. Pneumococcal polysaccharide (PPSV23) vaccine. One dose is recommended after age 48. Talk to your health care provider about which screenings and vaccines you need and how often you need them. This information is not intended to replace advice given to you by your health care provider. Make sure you discuss any questions you have with your health care provider. Document Released: 03/24/2015 Document Revised: 11/15/2015 Document Reviewed: 12/27/2014 Elsevier Interactive Patient Education  2017 [employer] Prevention in the Home Falls can cause injuries. They can happen to people of all ages. There are many things you can do to make your home safe and to help prevent falls. What can I do on the outside of my home? Regularly fix the edges of walkways and driveways and fix any cracks. Remove anything that might make you trip as you walk through a door, such as a raised step or threshold. Trim any bushes or trees on the path to your home. Use bright outdoor lighting. Clear any walking paths of anything that might make someone trip, such as rocks or tools. Regularly check to see if  handrails are loose or broken. Make sure that both sides of any steps have handrails. Any raised decks and porches should have guardrails on the edges. Have any leaves, snow, or ice cleared regularly. Use sand or salt on walking paths during winter. Clean up any spills in your garage right away. This includes oil or grease spills. What can I do in the bathroom? Use night lights. Install grab bars by the toilet and in the tub and shower. Do not use towel bars as grab bars. Use non-skid mats or decals in the tub or shower. If you need to sit down in the shower, use a plastic, non-slip stool. Keep the floor dry. Clean up any water that spills on the floor as soon as it happens. Remove soap buildup in the tub or shower regularly. Attach bath mats securely with double-sided non-slip rug tape. Do not have throw rugs and other things on the floor that can make you trip. What can I do in the bedroom? Use night lights. Make sure that you have a light by your bed that is easy to reach. Do  not use any sheets or blankets that are too big for your bed. They should not hang down onto the floor. Have a firm chair that has side arms. You can use this for support while you get dressed. Do not have throw rugs and other things on the floor that can make you trip. What can I do in the kitchen? Clean up any spills right away. Avoid walking on wet floors. Keep items that you use a lot in easy-to-reach places. If you need to reach something above you, use a strong step stool that has a grab bar. Keep electrical cords out of the way. Do not use floor polish or wax that makes floors slippery. If you must use wax, use non-skid floor wax. Do not have throw rugs and other things on the floor that can make you trip. What can I do with my stairs? Do not leave any items on the stairs. Make sure that there are handrails on both sides of the stairs and use them. Fix handrails that are broken or loose. Make sure that  handrails are as long as the stairways. Check any carpeting to make sure that it is firmly attached to the stairs. Fix any carpet that is loose or worn. Avoid having throw rugs at the top or bottom of the stairs. If you do have throw rugs, attach them to the floor with carpet tape. Make sure that you have a light switch at the top of the stairs and the bottom of the stairs. If you do not have them, ask someone to add them for you. What else can I do to help prevent falls? Wear shoes that: Do not have high heels. Have rubber bottoms. Are comfortable and fit you well. Are closed at the toe. Do not wear sandals. If you use a stepladder: Make sure that it is fully opened. Do not climb a closed stepladder. Make sure that both sides of the stepladder are locked into place. Ask someone to hold it for you, if possible. Clearly mark and make sure that you can see: Any grab bars or handrails. First and last steps. Where the edge of each step is. Use tools that help you move around (mobility aids) if they are needed. These include: Canes. Walkers. Scooters. Crutches. Turn on the lights when you go into a dark area. Replace any light bulbs as soon as they burn out. Set up your furniture so you have a clear path. Avoid moving your furniture around. If any of your floors are uneven, fix them. If there are any pets around you, be aware of where they are. Review your medicines with your doctor. Some medicines can make you feel dizzy. This can increase your chance of falling. Ask your doctor what other things that you can do to help prevent falls. This information is not intended to replace advice given to you by your health care provider. Make sure you discuss any questions you have with your health care provider. Document Released: 12/22/2008 Document Revised: 08/03/2015 Document Reviewed: 04/01/2014 Elsevier Interactive Patient Education  2017 [employer].

## 2022-05-27 DIAGNOSIS — H43813 Vitreous degeneration, bilateral: Secondary | ICD-10-CM | POA: Diagnosis not present

## 2022-05-27 DIAGNOSIS — H524 Presbyopia: Secondary | ICD-10-CM | POA: Diagnosis not present

## 2022-05-27 DIAGNOSIS — Z961 Presence of intraocular lens: Secondary | ICD-10-CM | POA: Diagnosis not present

## 2022-05-27 DIAGNOSIS — H5203 Hypermetropia, bilateral: Secondary | ICD-10-CM | POA: Diagnosis not present

## 2022-05-27 DIAGNOSIS — H02055 Trichiasis without entropian left lower eyelid: Secondary | ICD-10-CM | POA: Diagnosis not present

## 2022-05-27 DIAGNOSIS — H52223 Regular astigmatism, bilateral: Secondary | ICD-10-CM | POA: Diagnosis not present

## 2022-06-11 ENCOUNTER — Ambulatory Visit (INDEPENDENT_AMBULATORY_CARE_PROVIDER_SITE_OTHER): Payer: Medicare Other | Admitting: *Deleted

## 2022-06-11 DIAGNOSIS — E538 Deficiency of other specified B group vitamins: Secondary | ICD-10-CM

## 2022-06-11 MED ORDER — CYANOCOBALAMIN 1000 MCG/ML IJ SOLN
1000.0000 ug | Freq: Once | INTRAMUSCULAR | Status: AC
  Administered 2022-06-11: 1000 ug via INTRAMUSCULAR

## 2022-06-11 NOTE — Progress Notes (Signed)
Per orders of Dr. Tower, injection of Vitamin B-12 given by Hannan Tetzlaff. Patient tolerated injection well. 

## 2022-06-17 ENCOUNTER — Encounter: Payer: Self-pay | Admitting: Student in an Organized Health Care Education/Training Program

## 2022-06-17 ENCOUNTER — Ambulatory Visit (INDEPENDENT_AMBULATORY_CARE_PROVIDER_SITE_OTHER): Payer: Medicare Other | Admitting: Student in an Organized Health Care Education/Training Program

## 2022-06-17 VITALS — BP 136/72 | HR 85 | Temp 97.9°F | Ht 61.0 in | Wt 126.2 lb

## 2022-06-17 DIAGNOSIS — J849 Interstitial pulmonary disease, unspecified: Secondary | ICD-10-CM

## 2022-06-17 DIAGNOSIS — J679 Hypersensitivity pneumonitis due to unspecified organic dust: Secondary | ICD-10-CM | POA: Diagnosis not present

## 2022-06-17 MED ORDER — BUDESONIDE-FORMOTEROL FUMARATE 80-4.5 MCG/ACT IN AERO
2.0000 | INHALATION_SPRAY | Freq: Two times a day (BID) | RESPIRATORY_TRACT | 12 refills | Status: DC
Start: 2022-06-17 — End: 2022-07-05

## 2022-06-17 MED ORDER — PREDNISONE 10 MG PO TABS: ORAL_TABLET | ORAL | 0 refills | Status: AC

## 2022-06-17 NOTE — Progress Notes (Signed)
Synopsis: Referred in by Judy Pimple, MD  Assessment & Plan:   #Hypersensitivity Pneumonitis #ILD  Patient is presenting for the evaluation of abnormal findings on her chest xray. Previous imaging from 2021 did show subpleural reticulation, and repeat high resolution CT now shows mild increase in the reticulations, with some ground glass findings in the upper lobes.  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 Given the patient's reported history of a mold infestation, as well as findings of mold and water damage at the house, hypersensitivity pneumonitis is highest on my differential.   The CT does show faint ground glass opacities that are concerning to me for active inflammation. Given this, I will start her on a short course of steroids (with a relatively quick taper given age) after which I will start inhaled corticosteroids for local delivery. The patient is not bothered much by symptoms (barely symptomatic) and given her age, I believe the risk of any procedural intervention greatly outweighs any perceived benefits. As such, I recommend conservative management. Finally, I again stressed on them the importance of mold remediation. She reports that her grandson and other family members are working on the house as she cannot afford a Musician.  -Prednisone 40 mg for a week, then 30 mg for a week, then 20 mg for a week, then 10 mg for a week -Start Symbicort 80/4.5 two puffs twice dialy  Return in about 3 months (around 09/16/2022).  I spent 30 minutes caring for this patient today, including preparing to see the patient, obtaining a medical history , reviewing a separately obtained history, performing a medically appropriate examination and/or evaluation, counseling and educating the patient/family/caregiver, ordering medications, tests, or procedures, documenting clinical information in the electronic health record, and  independently interpreting results (not separately reported/billed) and communicating results to the patient/family/caregiver  Raechel Chute, MD [facility] 06/17/2022 6:23 PM    End of visit medications:  Meds ordered this encounter  Medications   predniSONE (DELTASONE) 10 MG tablet    Sig: Take 4 tablets (40 mg total) by mouth daily with breakfast for 7 days, THEN 3 tablets (30 mg total) daily with breakfast for 7 days, THEN 2 tablets (20 mg total) daily with breakfast for 7 days, THEN 1 tablet (10 mg total) daily with breakfast for 7 days.    Dispense:  70 tablet    Refill:  0   budesonide-formoterol (SYMBICORT) 80-4.5 MCG/ACT inhaler    Sig: Inhale 2 puffs into the lungs in the morning and at bedtime.    Dispense:  1 each    Refill:  12     Current Outpatient Medications:    budesonide-formoterol (SYMBICORT) 80-4.5 MCG/ACT inhaler, Inhale 2 puffs into the lungs in the morning and at bedtime., Disp: 1 each, Rfl: 12   carvedilol (COREG) 3.125 MG tablet, TAKE 1 TABLET(3.125 MG) BY MOUTH TWICE DAILY, Disp: 60 tablet, Rfl: 11   Cholecalciferol (VITAMIN D) 2000 UNITS CAPS, Take 1 capsule by mouth daily., Disp: , Rfl:    clopidogrel (PLAVIX) 75 MG tablet, TAKE 1 TABLET(75 MG) BY MOUTH DAILY WITH BREAKFAST, crush pill if unable to swallow, Disp: 90 tablet, Rfl: 3   cyanocobalamin (,VITAMIN B-12,) 1000 MCG/ML injection, Inject 1,000 mcg into the muscle every 30 (thirty) days., Disp: , Rfl:    docusate sodium (COLACE) 100 MG capsule, Take 2 capsules (200 mg total) by mouth 2 (two) times daily., Disp: 10 capsule,  Rfl: 0   losartan (COZAAR) 100 MG tablet, TAKE 1 TABLET(100 MG) BY MOUTH DAILY, Disp: 90 tablet, Rfl: 3   meclizine (ANTIVERT) 25 MG tablet, Take 25 mg by mouth 2 (two) times daily as needed., Disp: , Rfl:    Multiple Vitamin (MULTIVITAMIN WITH MINERALS) TABS tablet, Take 1 tablet by mouth daily., Disp: 30 tablet, Rfl: 1   nitroGLYCERIN (NITROSTAT) 0.4 MG SL  tablet, PLACE 1 TABLET UNDER TONGUE EVERY 5 MIN AS NEEDED FOR CHEST PAIN IF NO RELIEF IN15 MIN CALL 911 (MAX 3 TABS), Disp: 25 tablet, Rfl: 1   omeprazole (PRILOSEC) 20 MG capsule, TAKE 1 CAPSULE(20 MG) BY MOUTH DAILY AS NEEDED, Disp: 90 capsule, Rfl: 3   predniSONE (DELTASONE) 10 MG tablet, Take 4 tablets (40 mg total) by mouth daily with breakfast for 7 days, THEN 3 tablets (30 mg total) daily with breakfast for 7 days, THEN 2 tablets (20 mg total) daily with breakfast for 7 days, THEN 1 tablet (10 mg total) daily with breakfast for 7 days., Disp: 70 tablet, Rfl: 0   rosuvastatin (CRESTOR) 10 MG tablet, TAKE 1 TABLET(10 MG) BY MOUTH DAILY, Disp: 90 tablet, Rfl: 2   Skin Protectants, Misc. (EUCERIN) cream, Apply 1 application. topically as needed for dry skin., Disp: , Rfl:    Subjective:   PATIENT ID: Jenna Young GENDER: female DOB: 18-Apr-1931, MRN: [id number]  Chief Complaint  Patient presents with   Follow-up    Dry cough at times prod with clear sputum.     HPI  Patient is a pleasant 89 year old female presenting for follow up on abnormal findings on CXR.   Patient was asymptomatic/minimally symptoms aside from a mild cough that is productive of whitish sputum. This is unchanged compared to her prior visit. The cough had started a few months ago around the time of an URTI. She has no shortness of breath, albeit she is minimally active. She also has no chest tightness, no wheezing, no fevers, no chills, and no sputum production. She feels she is in her usual state of health. She is not limited in her activity and is able to do all the things she likes to do. She was previously undergoing physical therapy (after a hip fracture) with no limitation.   Patient was seen by her primary care physician for her cough, and a CXR was ordered. This was noted to have signs of underlying chronic lung disease. On my review of a previous chest CT, there was also subpleural reticulation on a previous  chest CT. I ordered a high resolution chest CT that was performed last month and findings were consistent with fibrotic NSIP vs. Fibrotic HSP.   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 She is a non-smoker (smoked for 10 years in the 33's). She does report finding mold at their house, which her son and grandson are currently working on fixing. They did have a mold specialist see the house, but professional remediation will be very expensive. She has no pets, but previously always had dogs.  Ancillary information including prior medications, full medical/surgical/family/social histories, and PFTs (when available) are listed below and have been reviewed.   Review of Systems  Constitutional:  Negative for chills, fever, malaise/fatigue and weight loss.  Respiratory:  Positive for cough. Negative for hemoptysis, sputum production, shortness of breath and wheezing.   Cardiovascular:  Negative for chest pain.  Skin:  Negative for  rash.     Objective:   Vitals:   06/17/22 1107  BP: 136/72  Pulse: 85  Temp: 97.9 F (36.6 C)  TempSrc: Temporal  SpO2: 96%  Weight: 126 lb 3.2 oz (57.2 kg)  Height: 5\' 1"  (1.549 m)   96% on RA  BMI Readings from Last 3 Encounters:  06/17/22 23.85 kg/m  05/23/22 23.24 kg/m  05/13/22 23.28 kg/m   Wt Readings from Last 3 Encounters:  06/17/22 126 lb 3.2 oz (57.2 kg)  05/23/22 123 lb (55.8 kg)  05/13/22 123 lb 3.2 oz (55.9 kg)    Physical Exam Constitutional:      Appearance: Normal appearance. She is not ill-appearing.  HENT:     Head: Normocephalic.     Nose: Nose normal.     Mouth/Throat:     Mouth: Mucous membranes are moist.  Cardiovascular:     Rate and Rhythm: Normal rate and regular rhythm.     Pulses: Normal pulses.     Heart sounds: Normal heart sounds.  Pulmonary:     Effort: Pulmonary effort is normal.     Breath sounds: Rales (faint bibasilar rales) present.   Abdominal:     Palpations: Abdomen is soft.  Musculoskeletal:     Cervical back: Neck supple.  Neurological:     General: No focal deficit present.     Mental Status: She is alert and oriented to person, place, and time. Mental status is at baseline.       Ancillary Information    Past Medical History:  Diagnosis Date   Allergic rhinitis    Anemia    Anxiety    GERD (gastroesophageal reflux disease)    History of radiation therapy 04/29/16- 05/24/16   Left Breast 40.05 Gy in 15 fractions, Left Breast boost 10 Gy in 5 fractions.    Hyperlipidemia    Hypertension    treated by Dr Eulis Canner recently with syncope episode   Osteopenia    Osteoporosis    Personal history of radiation therapy 2018     Family History  Problem Relation Age of Onset   Hypertension Mother    Heart disease Father    Macular degeneration Sister    Diabetes Maternal Aunt    Cancer Maternal Aunt        throat     Past Surgical History:  Procedure Laterality Date   APPENDECTOMY     BREAST BIOPSY Left 11/29/2008   BREAST BIOPSY Left 01/26/2016   malignant   BREAST EXCISIONAL BIOPSY Left 12/26/2008   BREAST LUMPECTOMY Left 02/27/2016   BREAST LUMPECTOMY Left 04/01/2016   BREAST LUMPECTOMY WITH RADIOACTIVE SEED LOCALIZATION Left 02/27/2016   Procedure: LEFT BREAST LUMPECTOMY WITH RADIOACTIVE SEED LOCALIZATION;  Surgeon: Chevis Pretty III, MD;  Location: [facility];  Service: General;  Laterality: Left;   BREAST SURGERY  10/10   benign biopsy  negative   CESAREAN SECTION     CHOLECYSTECTOMY     CORONARY STENT INTERVENTION N/A 02/11/2017   Procedure: CORONARY STENT INTERVENTION;  Surgeon: Yvonne Kendall, MD;  Location: [facility];  Service: Cardiovascular;  Laterality: N/A;   FEMUR FRACTURE SURGERY Right 2006   HIP ARTHROPLASTY Left 06/28/2021   Procedure: ARTHROPLASTY BIPOLAR HIP (HEMIARTHROPLASTY);  Surgeon: Ross Marcus, MD;  Location: [facility];  Service:  Orthopedics;  Laterality: Left;   LEFT HEART CATH AND CORONARY ANGIOGRAPHY N/A 02/11/2017   Procedure: LEFT HEART CATH AND CORONARY ANGIOGRAPHY;  Surgeon: Yvonne Kendall, MD;  Location: [facility]  INVASIVE CV LAB;  Service: Cardiovascular;  Laterality: N/A;   LEFT HEART CATH AND CORONARY ANGIOGRAPHY N/A 05/21/2019   Procedure: LEFT HEART CATH AND CORONARY ANGIOGRAPHY;  Surgeon: Iran Ouch, MD;  Location: [facility];  Service: Cardiovascular;  Laterality: N/A;   OVARIAN CYST SURGERY     RE-EXCISION OF BREAST LUMPECTOMY Left 04/01/2016   Procedure: RE-EXCISION OF LEFT BREAST MEDIAL MARGIN;  Surgeon: Chevis Pretty III, MD;  Location: [facility];  Service: General;  Laterality: Left;    Social History   Socioeconomic History   Marital status: Widowed    Spouse name: Not on file   Number of children: 1   Years of education: Not on file   Highest education level: Not on file  Occupational History    Employer: RETIRED  Tobacco Use   Smoking status: Former    Packs/day: 1.00    Years: 10.00    Additional pack years: 0.00    Total pack years: 10.00    Types: Cigarettes    Quit date: 03/11/1968    Years since quitting: 54.3   Smokeless tobacco: Never  Vaping Use   Vaping Use: Never used  Substance and Sexual Activity   Alcohol use: No    Alcohol/week: 0.0 standard drinks of alcohol   Drug use: No   Sexual activity: Not Currently  Other Topics Concern   Not on file  Social History Narrative   Not on file   Social Determinants of Health   Financial Resource Strain: Low Risk  (05/23/2022)   Overall Financial Resource Strain (CARDIA)    Difficulty of Paying Living Expenses: Not hard at all  Food Insecurity: No Food Insecurity (05/23/2022)   Hunger Vital Sign    Worried About Running Out of Food in the Last Year: Never true    Ran Out of Food in the Last Year: Never true  Transportation Needs: No Transportation Needs (05/23/2022)   PRAPARE - Therapist, art (Medical): No    Lack of Transportation (Non-Medical): No  Physical Activity: Insufficiently Active (05/23/2022)   Exercise Vital Sign    Days of Exercise per Week: 4 days    Minutes of Exercise per Session: 20 min  Stress: No Stress Concern Present (05/23/2022)   [employer] of Occupational Health - Occupational Stress Questionnaire    Feeling of Stress : Not at all  Social Connections: Unknown (05/23/2022)   Social Connection and Isolation Panel [NHANES]    Frequency of Communication with Friends and Family: More than three times a week    Frequency of Social Gatherings with Friends and Family: More than three times a week    Attends Religious Services: Patient unable to answer    Active Member of Clubs or Organizations: No    Attends Banker Meetings: Never    Marital Status: Widowed  Intimate Partner Violence: Not At Risk (05/23/2022)   Humiliation, Afraid, Rape, and Kick questionnaire    Fear of Current or Ex-Partner: No    Emotionally Abused: No    Physically Abused: No    Sexually Abused: No     Allergies  Allergen Reactions   Buspirone Hcl     REACTION: HEART PALPITATIONS   Codeine     REACTION: nausea and vomiting   Hydrochlorothiazide     REACTION: syncope-possibly from dehydration   Iron     Oral iron -per pt made her "pass out"    Isosorbide Nitrate Other (  See Comments)    hypotension   Lansoprazole     REACTION: abd pain   Minocycline Hcl    Oxycodone Other (See Comments)    Hallucinations    Penicillins     REACTION: mouth numbness Has patient had a PCN reaction causing immediate rash, facial/tongue/throat swelling, SOB or lightheadedness with hypotension: No Has patient had a PCN reaction causing severe rash involving mucus membranes or skin necrosis: No Has patient had a PCN reaction that required hospitalization: No Has patient had a PCN reaction occurring within the last 10 years: No If all of the above answers  are "NO", then may proceed with Cephalosporin use.   Phenergan [Promethazine Hcl] Other (See Comments)    Body shakes, hallucinations   Ramipril     Cough    Ranexa [Ranolazine]     Constipation    Sulfa Antibiotics Nausea Only   Rosuvastatin     Patient reports muscle pain, unable to take. Stopped around 07/22.     CBC    Component Value Date/Time   WBC 9.2 06/28/2021 0552   RBC 3.86 (L) 06/28/2021 0552   HGB 9.3 (L) 06/29/2021 1009   HGB 9.1 (L) 05/31/2019 1157   HCT 31.3 (L) 06/28/2021 0552   HCT 30.1 (L) 05/31/2019 1157   PLT 155 06/28/2021 0552   PLT 195 05/31/2019 1157   MCV 81.1 06/28/2021 0552   MCV 79 05/31/2019 1157   MCV 80 07/27/2013 1318   MCH 26.4 06/28/2021 0552   MCHC 32.6 06/28/2021 0552   RDW 15.7 (H) 06/28/2021 0552   RDW 18.8 (H) 05/31/2019 1157   RDW 17.9 (H) 07/27/2013 1318   LYMPHSABS 1.6 06/27/2021 1655   LYMPHSABS 2.0 05/31/2019 1157   LYMPHSABS 3.1 07/27/2013 1318   MONOABS 0.5 06/27/2021 1655   MONOABS 0.5 07/27/2013 1318   EOSABS 0.1 06/27/2021 1655   EOSABS 0.1 05/31/2019 1157   EOSABS 0.1 07/27/2013 1318   BASOSABS 0.0 06/27/2021 1655   BASOSABS 0.1 05/31/2019 1157   BASOSABS 0.1 07/27/2013 1318    Pulmonary Functions Testing Results:     No data to display          Outpatient Medications Prior to Visit  Medication Sig Dispense Refill   carvedilol (COREG) 3.125 MG tablet TAKE 1 TABLET(3.125 MG) BY MOUTH TWICE DAILY 60 tablet 11   Cholecalciferol (VITAMIN D) 2000 UNITS CAPS Take 1 capsule by mouth daily.     clopidogrel (PLAVIX) 75 MG tablet TAKE 1 TABLET(75 MG) BY MOUTH DAILY WITH BREAKFAST, crush pill if unable to swallow 90 tablet 3   cyanocobalamin (,VITAMIN B-12,) 1000 MCG/ML injection Inject 1,000 mcg into the muscle every 30 (thirty) days.     docusate sodium (COLACE) 100 MG capsule Take 2 capsules (200 mg total) by mouth 2 (two) times daily. 10 capsule 0   losartan (COZAAR) 100 MG tablet TAKE 1 TABLET(100 MG) BY MOUTH  DAILY 90 tablet 3   meclizine (ANTIVERT) 25 MG tablet Take 25 mg by mouth 2 (two) times daily as needed.     Multiple Vitamin (MULTIVITAMIN WITH MINERALS) TABS tablet Take 1 tablet by mouth daily. 30 tablet 1   nitroGLYCERIN (NITROSTAT) 0.4 MG SL tablet PLACE 1 TABLET UNDER TONGUE EVERY 5 MIN AS NEEDED FOR CHEST PAIN IF NO RELIEF IN15 MIN CALL 911 (MAX 3 TABS) 25 tablet 1   omeprazole (PRILOSEC) 20 MG capsule TAKE 1 CAPSULE(20 MG) BY MOUTH DAILY AS NEEDED 90 capsule 3   rosuvastatin (CRESTOR) 10  MG tablet TAKE 1 TABLET(10 MG) BY MOUTH DAILY 90 tablet 2   Skin Protectants, Misc. (EUCERIN) cream Apply 1 application. topically as needed for dry skin.     No facility-administered medications prior to visit.

## 2022-06-18 ENCOUNTER — Telehealth: Payer: Self-pay | Admitting: Cardiovascular Disease

## 2022-06-18 NOTE — Telephone Encounter (Signed)
Should be safe. Recommend she follow taper instructions as directed by pulmonology

## 2022-06-18 NOTE — Telephone Encounter (Signed)
Pt c/o medication issue:  1. Name of Medication:   predniSONE (DELTASONE) 10 MG tablet    2. How are you currently taking this medication (dosage and times per day)? As written   3. Are you having a reaction (difficulty breathing--STAT)? no  4. What is your medication issue?  Pt would like to know if this medication is safe to take with her other cardiac medications. Please advise.

## 2022-06-18 NOTE — Telephone Encounter (Signed)
Left a message for the patient to call back.  

## 2022-06-19 ENCOUNTER — Other Ambulatory Visit (HOSPITAL_COMMUNITY): Payer: Self-pay

## 2022-06-19 ENCOUNTER — Encounter: Payer: Self-pay | Admitting: Oncology

## 2022-06-19 ENCOUNTER — Telehealth: Payer: Self-pay

## 2022-06-19 NOTE — Telephone Encounter (Signed)
Pt returning call

## 2022-06-19 NOTE — Telephone Encounter (Signed)
Spoke with patient and informed her that the pharmacist stated the following: "Should be safe. Recommend she follow taper instructions as directed by pulmonology"  Informed her that the taper is as follows:  Take 4 tablets (40 mg total) by mouth daily with breakfast for 7 days, THEN 3 tablets (30 mg total) daily with breakfast for 7 days, THEN 2 tablets (20 mg total) daily with breakfast for 7 days, THEN 1 tablet (10 mg total) daily with breakfast for 7 days.

## 2022-06-19 NOTE — Telephone Encounter (Signed)
I have left a message for the patient to return my call.  

## 2022-06-19 NOTE — Telephone Encounter (Signed)
Dr. Aundria Rud, please advise on which medication you would like to switch her to.

## 2022-06-19 NOTE — Telephone Encounter (Signed)
PA request received via [facility] for Budesonide-Formoterol Fumarate 80-4.5MCG/ACT aerosol  PA not submitted due to alternatives being covered and no documentation of failure.   Key: [id number]

## 2022-06-19 NOTE — Telephone Encounter (Signed)
She asked that you call home phone

## 2022-06-19 NOTE — Telephone Encounter (Signed)
Advair HFA is an acceptable alternative.

## 2022-06-20 NOTE — Telephone Encounter (Signed)
I have left another message for the patient to return my call. I will try one more time due to the nature of the call.

## 2022-06-21 NOTE — Telephone Encounter (Signed)
I have left another message for the patient to return my call. I have placed a letter in the mail notifying the patient. I have asked her to contact our office with the name of the pharmacy she would like [place] to send the prescription into.  Nothing further needed.

## 2022-07-01 ENCOUNTER — Telehealth: Payer: Self-pay | Admitting: Student in an Organized Health Care Education/Training Program

## 2022-07-01 NOTE — Telephone Encounter (Signed)
Pt. Need help getting her inhaler budesonide-formoterol (SYMBICORT) 80-4.5 MCG/ACT inhaler  she can't get due to insurance

## 2022-07-03 ENCOUNTER — Other Ambulatory Visit (HOSPITAL_COMMUNITY): Payer: Self-pay

## 2022-07-03 NOTE — Telephone Encounter (Signed)
Medication has been changed. Please refer to PA encounter from 06-19-2022. Thank you

## 2022-07-04 NOTE — Telephone Encounter (Signed)
Lm for patient.  

## 2022-07-04 NOTE — Telephone Encounter (Signed)
Called pt. And if we can get meds sent to [facility] in [place] could we get this sent in for her

## 2022-07-05 MED ORDER — FLUTICASONE-SALMETEROL 115-21 MCG/ACT IN AERO
2.0000 | INHALATION_SPRAY | Freq: Two times a day (BID) | RESPIRATORY_TRACT | 5 refills | Status: DC
Start: 2022-07-05 — End: 2022-09-25

## 2022-07-05 NOTE — Addendum Note (Signed)
Addended by: Lajoyce Lauber A on: 07/05/2022 11:30 AM   Modules accepted: Orders

## 2022-07-05 NOTE — Telephone Encounter (Signed)
Lm x2 for patient.   Dr. Aundria Rud, please verify Advair dosage.

## 2022-07-05 NOTE — Telephone Encounter (Signed)
Advair 115 sent to preferred pharmacy.  Previously letter informed patient of medication change.  Will close encounter per office protocol.

## 2022-07-05 NOTE — Telephone Encounter (Signed)
Advair HFA 115/21 mcg two puffs twice daily. Thank you

## 2022-07-14 NOTE — Telephone Encounter (Signed)
Medication has been changed    And sent to pharmacy closing encounter

## 2022-07-15 DIAGNOSIS — H02055 Trichiasis without entropian left lower eyelid: Secondary | ICD-10-CM | POA: Diagnosis not present

## 2022-07-15 DIAGNOSIS — H43813 Vitreous degeneration, bilateral: Secondary | ICD-10-CM | POA: Diagnosis not present

## 2022-07-15 DIAGNOSIS — S0502XA Injury of conjunctiva and corneal abrasion without foreign body, left eye, initial encounter: Secondary | ICD-10-CM | POA: Diagnosis not present

## 2022-07-15 DIAGNOSIS — H524 Presbyopia: Secondary | ICD-10-CM | POA: Diagnosis not present

## 2022-07-15 DIAGNOSIS — H5203 Hypermetropia, bilateral: Secondary | ICD-10-CM | POA: Diagnosis not present

## 2022-07-15 DIAGNOSIS — H52223 Regular astigmatism, bilateral: Secondary | ICD-10-CM | POA: Diagnosis not present

## 2022-07-15 DIAGNOSIS — Z961 Presence of intraocular lens: Secondary | ICD-10-CM | POA: Diagnosis not present

## 2022-07-16 ENCOUNTER — Ambulatory Visit (INDEPENDENT_AMBULATORY_CARE_PROVIDER_SITE_OTHER): Payer: Medicare Other

## 2022-07-16 ENCOUNTER — Telehealth: Payer: Self-pay | Admitting: Family Medicine

## 2022-07-16 DIAGNOSIS — E538 Deficiency of other specified B group vitamins: Secondary | ICD-10-CM

## 2022-07-16 MED ORDER — CYANOCOBALAMIN 1000 MCG/ML IJ SOLN
1000.0000 ug | Freq: Once | INTRAMUSCULAR | Status: AC
  Administered 2022-07-16: 1000 ug via INTRAMUSCULAR

## 2022-07-16 NOTE — Telephone Encounter (Signed)
[facility] called in stated they will need the last 2 office noted in order to fill the prescription for the pt back brace #[phone]  fax # [phone]

## 2022-07-16 NOTE — Telephone Encounter (Signed)
That is a scam we never sent a order for a back brace on pt and will not send office notes to this place

## 2022-07-16 NOTE — Progress Notes (Signed)
Per orders of Dr. Roxy Manns, injection of Vitamin B 12 given in right deltoid given by Lewanda Rife. Patient tolerated injection well. Pt gets monthly B 12.

## 2022-07-22 ENCOUNTER — Telehealth: Payer: Self-pay

## 2022-07-22 ENCOUNTER — Other Ambulatory Visit: Payer: Self-pay

## 2022-07-22 MED ORDER — FLUTICASONE-SALMETEROL 115-21 MCG/ACT IN AERO: 2.0000 | INHALATION_SPRAY | Freq: Two times a day (BID) | RESPIRATORY_TRACT | 12 refills | Status: DC

## 2022-07-22 NOTE — Telephone Encounter (Signed)
Received drug change request from [facility] for wixela. Preferred is brand name Adviar HFA, Breo or Dulera.  Adviar 115 sent to preferred pharmacy. Please refer to 06/19/2022 phone note.  Nothing further needed.

## 2022-07-23 ENCOUNTER — Ambulatory Visit: Payer: Self-pay | Admitting: Internal Medicine

## 2022-07-26 NOTE — Progress Notes (Signed)
Interstitial Lung Disease Multidisciplinary Conference   Jenna Young    MRN [id number]    DOB 06-Apr-1931  Primary Care Physician:Tower, Audrie Gallus, MD  Referring Physician: Dr. Aundria Rud  Time of Conference: 7.00am- 8.00am Date of conference: 07/23/2022 Location of Conference: -  Virtual  Participating Pulmonary: Dr. Kalman Shan, Dr Chilton Greathouse, Dr. Melody Comas Pathology:  Radiology: Dr Cleone Slim  Others:   Brief History: 15F presented with abnormal CXR, CT with findings consistent with ILD. History most notable for a mold infestation at her house, that they are currently remediating. I decided to treat as HSP with a short course of steroids, and then place her on Symbicort. Review imaging.    PFT     No data to display            MDD discussion of CT scan    - Date or time period of scan: HRCT: 05/22/2022 and CTA March 2021  - Discussion synopsis:  March 2024: Mild to moderate ILD +. Basilar predom +. No air trapping. Mild progressio since march 2021. This is all c/w UIP. At lung apices in march 2024: there is minimal apical central nodularity. This is very mild and not dominant . Unchanged since 2021. This is to be ignored as part of the ILD workup. could be post inflammatory  - What is the final conclusion per 2018 ATS/Fleischner Criteria -  Chronic issue. progressive. UIP domiannt feature.   - Concordance with official report: discordant  Pathology discussion of biopsy: n/a    MDD Impression/Recs: Consider serology -> if negative dx is IPF   Time Spent in preparation and discussion:  > 30 min    SIGNATURE   Dr. Kalman Shan, M.D., F.C.C.P,  Pulmonary and Critical Care Medicine Staff Physician, [facility] Center Director - Interstitial Lung Disease  Program  Pulmonary Fibrosis [facility] [place], [place], [address]  Pager: [phone], If no answer or between  15:00h - 7:00h: call  [phone] Telephone: [phone]  12:09 PM 07/26/2022 ...................................................................................................................Marland Kitchen References: Diagnosis of Hypersensitivity Pneumonitis in Adults. An Official ATS/JRS/ALAT Clinical Practice Guideline. Ragu G et al, Am J Respir Crit Care Med. 2020 Aug 1;202(3):e36-e69.       Diagnosis of Idiopathic Pulmonary Fibrosis. An Official ATS/ERS/JRS/ALAT Clinical Practice Guideline. Raghu G et al, Am J Respir Crit Care Med. 2018 Sep 1;198(5):e44-e68.   IPF Suspected   Histopath ology Pattern      UIP  Probable UIP  Indeterminate for  UIP  Alternative  diagnosis    UIP  IPF  IPF  IPF  Non-IPF dx   HRCT   Probabe UIP  IPF  IPF  IPF (Likely)**  Non-IPF dx  Pattern  Indeterminate for UIP  IPF  IPF (Likely)**  Indeterminate  for IPF**  Non-IPF dx    Alternative diagnosis  IPF (Likely)**/ non-IPF dx  Non-IPF dx  Non-IPF dx  Non-IPF dx     Idiopathic pulmonary fibrosis diagnosis based upon HRCT and Biopsy paterns.  ** IPF is the likely diagnosis when any of following features are present:  Moderate-to-severe traction bronchiectasis/bronchiolectasis (defined as mild traction bronchiectasis/bronchiolectasis in four or more lobes including the lingual as a lobe, or moderate to severe traction bronchiectasis in two or more lobes) in a man over age 33 years or in a woman over age 29 years Extensive (>30%) reticulation on HRCT and an age >70 years  Increased neutrophils and/or  absence of lymphocytosis in BAL fluid  Multidisciplinary discussion reaches a confident diagnosis of IPF.   **Indeterminate for IPF  Without an adequate biopsy is unlikely to be IPF  With an adequate biopsy may be reclassified to a more specific diagnosis after multidisciplinary discussion and/or additional consultation.   dx = diagnosis; HRCT = high-resolution computed tomography; IPF = idiopathic  pulmonary fibrosis; UIP = usual interstitial pneumonia.

## 2022-08-07 ENCOUNTER — Other Ambulatory Visit: Payer: Self-pay | Admitting: Student in an Organized Health Care Education/Training Program

## 2022-08-07 DIAGNOSIS — J849 Interstitial pulmonary disease, unspecified: Secondary | ICD-10-CM

## 2022-08-07 NOTE — Progress Notes (Signed)
Lm for patient.  

## 2022-08-08 NOTE — Progress Notes (Signed)
Lm x2 for patient. Will call once more due to nature of call.   

## 2022-08-09 NOTE — Progress Notes (Signed)
Lm x3 for patient.  Will close encounter per office protocol.  Letter mailed to address on file.

## 2022-08-20 ENCOUNTER — Ambulatory Visit (INDEPENDENT_AMBULATORY_CARE_PROVIDER_SITE_OTHER): Payer: Medicare Other | Admitting: *Deleted

## 2022-08-20 DIAGNOSIS — E538 Deficiency of other specified B group vitamins: Secondary | ICD-10-CM | POA: Diagnosis not present

## 2022-08-20 MED ORDER — CYANOCOBALAMIN 1000 MCG/ML IJ SOLN
1000.0000 ug | Freq: Once | INTRAMUSCULAR | Status: AC
Start: 2022-08-20 — End: 2022-08-20
  Administered 2022-08-20: 1000 ug via INTRAMUSCULAR

## 2022-08-20 NOTE — Progress Notes (Signed)
Per orders of Dr. Tower, injection of Vitamin B-12 given by Aarik Blank. Patient tolerated injection well. 

## 2022-08-20 NOTE — Progress Notes (Unsigned)
Office Visit    Patient Name: Jenna Young Date of Encounter: 08/20/2022  Primary Care Provider:  Judy Pimple, MD Primary Cardiologist:  Julien Nordmann, MD  Chief Complaint    89 year old female with past medical history of CAD, in N-STEMI with cardiac catheterization in 3/21, syncope, anemia, hyperlipidemia, and hypertension.   Past Medical History    Past Medical History:  Diagnosis Date   Allergic rhinitis    Anemia    Anxiety    GERD (gastroesophageal reflux disease)    History of radiation therapy 04/29/16- 05/24/16   Left Breast 40.05 Gy in 15 fractions, Left Breast boost 10 Gy in 5 fractions.    Hyperlipidemia    Hypertension    treated by Dr Eulis Canner recently with syncope episode   Osteopenia    Osteoporosis    Personal history of radiation therapy 2018   Past Surgical History:  Procedure Laterality Date   APPENDECTOMY     BREAST BIOPSY Left 11/29/2008   BREAST BIOPSY Left 01/26/2016   malignant   BREAST EXCISIONAL BIOPSY Left 12/26/2008   BREAST LUMPECTOMY Left 02/27/2016   BREAST LUMPECTOMY Left 04/01/2016   BREAST LUMPECTOMY WITH RADIOACTIVE SEED LOCALIZATION Left 02/27/2016   Procedure: LEFT BREAST LUMPECTOMY WITH RADIOACTIVE SEED LOCALIZATION;  Surgeon: Chevis Pretty III, MD;  Location: [facility];  Service: General;  Laterality: Left;   BREAST SURGERY  10/10   benign biopsy  negative   CESAREAN SECTION     CHOLECYSTECTOMY     CORONARY STENT INTERVENTION N/A 02/11/2017   Procedure: CORONARY STENT INTERVENTION;  Surgeon: Yvonne Kendall, MD;  Location: [facility];  Service: Cardiovascular;  Laterality: N/A;   FEMUR FRACTURE SURGERY Right 2006   HIP ARTHROPLASTY Left 06/28/2021   Procedure: ARTHROPLASTY BIPOLAR HIP (HEMIARTHROPLASTY);  Surgeon: Ross Marcus, MD;  Location: [facility];  Service: Orthopedics;  Laterality: Left;   LEFT HEART CATH AND CORONARY ANGIOGRAPHY N/A 02/11/2017   Procedure: LEFT HEART CATH AND  CORONARY ANGIOGRAPHY;  Surgeon: Yvonne Kendall, MD;  Location: [facility];  Service: Cardiovascular;  Laterality: N/A;   LEFT HEART CATH AND CORONARY ANGIOGRAPHY N/A 05/21/2019   Procedure: LEFT HEART CATH AND CORONARY ANGIOGRAPHY;  Surgeon: Iran Ouch, MD;  Location: [facility];  Service: Cardiovascular;  Laterality: N/A;   OVARIAN CYST SURGERY     RE-EXCISION OF BREAST LUMPECTOMY Left 04/01/2016   Procedure: RE-EXCISION OF LEFT BREAST MEDIAL MARGIN;  Surgeon: Chevis Pretty III, MD;  Location: [facility];  Service: General;  Laterality: Left;    Allergies  Allergies  Allergen Reactions   Buspirone Hcl     REACTION: HEART PALPITATIONS   Codeine     REACTION: nausea and vomiting   Hydrochlorothiazide     REACTION: syncope-possibly from dehydration   Iron     Oral iron -per pt made her "pass out"    Isosorbide Nitrate Other (See Comments)    hypotension   Lansoprazole     REACTION: abd pain   Minocycline Hcl    Oxycodone Other (See Comments)    Hallucinations    Penicillins     REACTION: mouth numbness Has patient had a PCN reaction causing immediate rash, facial/tongue/throat swelling, SOB or lightheadedness with hypotension: No Has patient had a PCN reaction causing severe rash involving mucus membranes or skin necrosis: No Has patient had a PCN reaction that required hospitalization: No Has patient had a PCN reaction occurring within the last 10 years:  No If all of the above answers are "NO", then may proceed with Cephalosporin use.   Phenergan [Promethazine Hcl] Other (See Comments)    Body shakes, hallucinations   Ramipril     Cough    Ranexa [Ranolazine]     Constipation    Sulfa Antibiotics Nausea Only   Rosuvastatin     Patient reports muscle pain, unable to take. Stopped around 07/22.     Labs/Other Studies Reviewed    The following studies were reviewed today: *** Cardiac Studies & Procedures   CARDIAC  CATHETERIZATION  CARDIAC CATHETERIZATION 05/21/2019  Narrative  Ost 1st Mrg lesion is 100% stenosed.  Prox RCA lesion is 30% stenosed.  Mid RCA-1 lesion is 80% stenosed.  Mid RCA-2 lesion is 100% stenosed.  LV end diastolic pressure is moderately elevated.  The left ventricular ejection fraction is 35-45% by visual estimate.  There is moderate left ventricular systolic dysfunction.  1.  Significant two-vessel coronary artery disease with heavily calcified arteries.  The right coronary artery which was severely diseased before is now completely occluded with good left-to-right collaterals.  In addition, OM1 is now occluded and previously had 90% stenosis.  This also has collaterals.  No other obstructive disease. 2.  Moderately reduced LV systolic function.  Moderately elevated left ventricular end-diastolic pressure at 22 mmHg.  Recommendations: No revascularization is advised as the patient has collaterals. It is still possible that her myocardial infarction was due to supply demand ischemia in the setting of uncontrolled hypertension. In addition, she now has cardiomyopathy with moderately reduced EF.  Due to that and in order to better control her blood pressure, I switch metoprolol to carvedilol and added small dose losartan.  These medications should be uptitrated for blood pressure control.  I ordered an echocardiogram to better evaluate her EF. The patient can likely be discharged home tomorrow.  Findings Coronary Findings Diagnostic  Dominance: Right  Left Main Vessel is large.  Left Anterior Descending Vessel is moderate in size. There is mild diffuse disease throughout the vessel. The distal LAD tapers to a small vessel, terminating before the apex.  First Diagonal Branch Vessel is moderate in size.  Left Circumflex Vessel is moderate in size. There is mild diffuse disease throughout the vessel.  First Obtuse Marginal Branch Collaterals 1st Mrg filled by  collaterals from 2nd Mrg.  Ost 1st Mrg lesion is 100% stenosed.  Second Obtuse Marginal Branch Vessel is small in size.  Third Obtuse Marginal Branch Vessel is small in size.  Right Coronary Artery Vessel is moderate in size. Prox RCA lesion is 30% stenosed. The lesion is eccentric. Mid RCA-1 lesion is 80% stenosed. The lesion is tubular. The lesion is mildly calcified. The lesion was previously treated. Mid RCA-2 lesion is 100% stenosed. The lesion is irregular and thrombotic. The lesion is severely calcified. The lesion was previously treated.  Right Posterior Descending Artery Vessel is moderate in size. Collaterals RPDA filled by collaterals from Dist LAD.  Right Posterior Atrioventricular Artery Vessel is moderate in size. Collaterals RPAV filled by collaterals from Dist Cx.  Intervention  No interventions have been documented.   CARDIAC CATHETERIZATION  CARDIAC CATHETERIZATION 02/11/2017  Narrative  Post intervention, there is a 80% residual stenosis.  Post intervention, there is a 95% residual stenosis.  Conclusions: 1. Significant mid RCA disease with sequential, calcified 80 and 95% stenoses. Element of thrombus is likely also present. 2. 90% ostial stenosis involving small OM1 branch. 3. Mildly reduced left ventricular contraction with  inferior hypokinesis (LVEF ~45%). 4. Mildly elevated left ventricular filling pressure. 5. Unsuccessful PCI to mid RCA due to inability to cross the mid RCA stenosis despite aggressive buddy wire support.  Recommendations: 1. Restart heparin 2 hours after TR band removal to complete 48 hours of heparin from time of admission. 2. Aspirin and clopidogrel for at least 12 months. 3. Optimize antianginal therapy. If patient has continued symptoms, PCI with atherectomy of the mid RCA could be considered in 4 weeks.  Yvonne Kendall, MD [facility] Pager: [phone]  Findings Coronary Findings Diagnostic  Dominance:  Right  Left Main Vessel is large.  Left Anterior Descending Vessel is moderate in size. There is mild diffuse disease throughout the vessel. The distal LAD tapers to a small vessel, terminating before the apex.  First Diagonal Branch Vessel is moderate in size.  Left Circumflex Vessel is moderate in size.  First Obtuse Marginal Branch Ost 1st Mrg lesion is 90% stenosed.  Second Obtuse Marginal Branch Vessel is small in size.  Third Obtuse Marginal Branch Vessel is small in size.  Right Coronary Artery Vessel is moderate in size. Prox RCA lesion is 30% stenosed. The lesion is eccentric. Mid RCA-1 lesion is 80% stenosed. The lesion is tubular. The lesion is mildly calcified. Mid RCA-2 lesion is 95% stenosed. The lesion is irregular and thrombotic. The lesion is calcified.  Right Posterior Descending Artery Vessel is moderate in size.  Right Posterior Atrioventricular Artery Vessel is moderate in size.  Intervention  Mid RCA-1 lesion Post-Intervention Lesion Assessment The intervention was unsuccessful due to inability to cross the lesion with balloon. Pre-interventional TIMI flow is 3. Post-intervention TIMI flow is 3. There is a 80% residual stenosis post intervention.  Mid RCA-2 lesion Post-Intervention Lesion Assessment The intervention was unsuccessful due to inability to cross the lesion with balloon. Pre-interventional TIMI flow is 3. Post-intervention TIMI flow is 3. There is a 95% residual stenosis post intervention.     ECHOCARDIOGRAM  ECHOCARDIOGRAM COMPLETE 05/22/2019  Narrative ECHOCARDIOGRAM REPORT    Patient Name:   Jenna Young [facility] Date of Exam: 05/21/2019 Medical Rec #:  [id number]           Height:       60.0 in Accession #:    [id number]          Weight:       128.3 lb Date of Birth:  May 17, 1931           BSA:          1.546 m Patient Age:    87 years            BP:           127/62 mmHg Patient Gender: F                   HR:           79  bpm. Exam Location:  [facility]  Procedure: 2D Echo  Indications:     Acute Myocardial Infarction 410  History:         Patient has prior history of Echocardiogram examinations, most recent 10/17/2017. CAD, Signs/Symptoms:Chest Pain; Risk Factors:Hypertension and Dyslipidemia.  Sonographer:     Johnathan Hausen Referring Phys:  [id number] IONGEXBM A ARIDA Diagnosing Phys: Debbe Odea MD   Sonographer Comments: Pt did not tolerate the imaging well. Pt states her left breast is very tender due to having a lumpectomy IMPRESSIONS   1. Left ventricular ejection fraction, by estimation,  is 40 to 45%. The left ventricle has mild to moderately decreased function. The left ventricle demonstrates global hypokinesis. There is mild left ventricular hypertrophy. Left ventricular diastolic parameters are consistent with Grade I diastolic dysfunction (impaired relaxation). 2. Right ventricular systolic function is normal. The right ventricular size is normal. There is mildly elevated pulmonary artery systolic pressure. 3. Left atrial size was mildly dilated. 4. The mitral valve is grossly normal. Trivial mitral valve regurgitation. 5. The aortic valve is grossly normal. Aortic valve regurgitation is trivial. 6. The inferior vena cava is normal in size with greater than 50% respiratory variability, suggesting right atrial pressure of 3 mmHg.  FINDINGS Left Ventricle: Left ventricular ejection fraction, by estimation, is 40 to 45%. The left ventricle has mild to moderately decreased function. The left ventricle demonstrates global hypokinesis. The left ventricular internal cavity size was normal in size. There is mild left ventricular hypertrophy. Left ventricular diastolic parameters are consistent with Grade I diastolic dysfunction (impaired relaxation).  Right Ventricle: The right ventricular size is normal. Right vetricular wall thickness was not assessed. Right ventricular systolic function is normal.  There is mildly elevated pulmonary artery systolic pressure. The tricuspid regurgitant velocity is 2.71 m/s, and with an assumed right atrial pressure of 3 mmHg, the estimated right ventricular systolic pressure is 32.4 mmHg.  Left Atrium: Left atrial size was mildly dilated.  Right Atrium: Right atrial size was normal in size.  Pericardium: There is no evidence of pericardial effusion.  Mitral Valve: The mitral valve is grossly normal. Mild mitral annular calcification. Trivial mitral valve regurgitation.  Tricuspid Valve: The tricuspid valve is normal in structure. Tricuspid valve regurgitation is not demonstrated.  Aortic Valve: The aortic valve is grossly normal. Aortic valve regurgitation is trivial. Aortic regurgitation PHT measures 621 msec.  Pulmonic Valve: The pulmonic valve was normal in structure. Pulmonic valve regurgitation is not visualized.  Aorta: The aortic root is normal in size and structure.  Venous: The inferior vena cava is normal in size with greater than 50% respiratory variability, suggesting right atrial pressure of 3 mmHg.  IAS/Shunts: No atrial level shunt detected by color flow Doppler.   LEFT VENTRICLE PLAX 2D LVIDd:         4.66 cm  Diastology LVIDs:         3.91 cm  LV e' lateral:   5.77 cm/s LV PW:         1.22 cm  LV E/e' lateral: 14.7 LV IVS:        1.35 cm LVOT diam:     1.80 cm LVOT Area:     2.54 cm   RIGHT VENTRICLE             IVC RV S prime:     12.60 cm/s  IVC diam: 1.75 cm  LEFT ATRIUM             Index LA diam:        3.50 cm 2.26 cm/m LA Vol (A2C):   74.5 ml 48.20 ml/m LA Vol (A4C):   53.2 ml 34.42 ml/m LA Biplane Vol: 63.2 ml 40.89 ml/m AORTIC VALVE AI PHT:      621 msec  AORTA Ao Root diam: 3.10 cm  MITRAL VALVE                TRICUSPID VALVE MV Area (PHT): 4.15 cm     TR Peak grad:   29.4 mmHg MV Decel Time: 183 msec     TR Vmax:  271.00 cm/s MV E velocity: 84.60 cm/s MV A velocity: 128.00 cm/s  SHUNTS MV  E/A ratio:  0.66         Systemic Diam: 1.80 cm  Debbe Odea MD Electronically signed by Debbe Odea MD Signature Date/Time: 05/22/2019/4:33:25 PM    Final    MONITORS  CARDIAC EVENT MONITOR 02/26/2016  Narrative Event Monitor Normal sinus rhythm with one short of of NSVT 3 beats Periods of sinus tachycardia  Signed, Dossie Arbour, MD, Ph.D [facility]          Recent Labs: 11/19/2021: BUN 8; Creatinine, Ser 0.89; Potassium 4.3; Sodium 137  Recent Lipid Panel    Component Value Date/Time   CHOL 147 05/11/2021 0945   TRIG 98.0 05/11/2021 0945   HDL 62.80 05/11/2021 0945   CHOLHDL 2 05/11/2021 0945   VLDL 19.6 05/11/2021 0945   LDLCALC 65 05/11/2021 0945   LDLDIRECT 116.6 01/27/2013 0905    History of Present Illness    89 year old female with past medical history of CAD, in N-STEMI with cardiac catheterization with CTO of RCA and OM1 with collaterals in 3/21, syncope, anemia, hyperlipidemia, and hypertension.   In December 2018 she had a non-ST elevation myocardial infarction.  Her cardiac catheterization at that time showed severe subtotal occlusion of the mid RCA which was heavily calcified, attempted PCI was not successful.  She was started on medication management.  Then in March 2021 she had another non-ST elevation myocardial infarction.  Cardiac catheterization at that time showed significant two-vessel coronary artery disease with heavily calcified arteries.  The RCA was completely occluded with good left to right collaterals.  The OM1 was occluded, collaterals present.  Corresponding echocardiogram showed LVEF of 40 to 45% with the left ventricle having mild to moderately decreased function and demonstrating global hypokinesis, there was Grade I diastolic dysfunction.  She was continued with medical management.  She was last seen in office by Dr. Mariah Milling in January 2024. At that time atypical chest pain was noted, felt likely musculoskeletal in nature, she  reported no further syncopal episodes, there were no medication changes made at that time.  Today she presents with  Chest pain, shortness of breath, syncope   Repeat echo?  LHC needed?   CAD:  History of N- Stemi in 2018 and 2021 Her last cardiac catheterization 05/21/19 indicated complete occlusion of the RCA with left to right collateral present and occlusion of the OM1 also with collaterals.  Echo from 3/21 showed LVEF of 40 to 45% with the left ventricle having mild to moderately decreased function and demonstrating global hypokinesis, there was Grade I diastolic dysfunction. Previously tried isosorbide, discontinued following near syncope/syncope Today Blood pressure  Continue Coreg 3.125mg  twice daily, clopidogrel 75mg  daily, losartan 100mg  daily, rosuvastatin 10mg  daily  Syncope Further episodes?  Essential hypertension Blood pressure today Home monitoring? Continue Coreg 3.125mg  twice daily and losartan 100mg  daily Hypercholesterolemia  Previous myalgias on lipitor Last lipid panel showed LDL 65 in 3/23 Continue rosuvastatin 10mg  daily   Home Medications    Current Outpatient Medications  Medication Sig Dispense Refill   carvedilol (COREG) 3.125 MG tablet TAKE 1 TABLET(3.125 MG) BY MOUTH TWICE DAILY 60 tablet 11   Cholecalciferol (VITAMIN D) 2000 UNITS CAPS Take 1 capsule by mouth daily.     clopidogrel (PLAVIX) 75 MG tablet TAKE 1 TABLET(75 MG) BY MOUTH DAILY WITH BREAKFAST, crush pill if unable to swallow 90 tablet 3   cyanocobalamin (,VITAMIN B-12,) 1000 MCG/ML injection Inject 1,000 mcg  into the muscle every 30 (thirty) days.     docusate sodium (COLACE) 100 MG capsule Take 2 capsules (200 mg total) by mouth 2 (two) times daily. 10 capsule 0   fluticasone-salmeterol (ADVAIR HFA) 115-21 MCG/ACT inhaler Inhale 2 puffs into the lungs 2 (two) times daily. 1 each 5   fluticasone-salmeterol (ADVAIR HFA) 115-21 MCG/ACT inhaler Inhale 2 puffs into the lungs 2 (two) times  daily. 1 each 12   losartan (COZAAR) 100 MG tablet TAKE 1 TABLET(100 MG) BY MOUTH DAILY 90 tablet 3   meclizine (ANTIVERT) 25 MG tablet Take 25 mg by mouth 2 (two) times daily as needed.     Multiple Vitamin (MULTIVITAMIN WITH MINERALS) TABS tablet Take 1 tablet by mouth daily. 30 tablet 1   nitroGLYCERIN (NITROSTAT) 0.4 MG SL tablet PLACE 1 TABLET UNDER TONGUE EVERY 5 MIN AS NEEDED FOR CHEST PAIN IF NO RELIEF IN15 MIN CALL 911 (MAX 3 TABS) 25 tablet 1   omeprazole (PRILOSEC) 20 MG capsule TAKE 1 CAPSULE(20 MG) BY MOUTH DAILY AS NEEDED 90 capsule 3   rosuvastatin (CRESTOR) 10 MG tablet TAKE 1 TABLET(10 MG) BY MOUTH DAILY 90 tablet 2   Skin Protectants, Misc. (EUCERIN) cream Apply 1 application. topically as needed for dry skin.     No current facility-administered medications for this visit.     Review of Systems    ***.  All other systems reviewed and are otherwise negative except as noted above.    Physical Exam    VS:  There were no vitals taken for this visit. , BMI There is no height or weight on file to calculate BMI.     GEN: Well nourished, well developed, in no acute distress. HEENT: normal. Neck: Supple, no JVD, carotid bruits, or masses. Cardiac: RRR, no murmurs, rubs, or gallops. No clubbing, cyanosis, edema.  Radials/DP/PT 2+ and equal bilaterally.  Respiratory:  Respirations regular and unlabored, clear to auscultation bilaterally. GI: Soft, nontender, nondistended, BS + x 4. MS: no deformity or atrophy. Skin: warm and dry, no rash. Neuro:  Strength and sensation are intact. Psych: Normal affect.  Accessory Clinical Findings    ECG personally reviewed by me today - *** - no acute changes.   Lab Results  Component Value Date   WBC 9.2 06/28/2021   HGB 9.3 (L) 06/29/2021   HCT 31.3 (L) 06/28/2021   MCV 81.1 06/28/2021   PLT 155 06/28/2021   Lab Results  Component Value Date   CREATININE 0.89 11/19/2021   BUN 8 11/19/2021   NA 137 11/19/2021   K 4.3  11/19/2021   CL 103 11/19/2021   CO2 27 11/19/2021   Lab Results  Component Value Date   ALT 17 06/27/2021   AST 25 06/27/2021   ALKPHOS 66 06/27/2021   BILITOT 0.7 06/27/2021   Lab Results  Component Value Date   CHOL 147 05/11/2021   HDL 62.80 05/11/2021   LDLCALC 65 05/11/2021   LDLDIRECT 116.6 01/27/2013   TRIG 98.0 05/11/2021   CHOLHDL 2 05/11/2021    Lab Results  Component Value Date   HGBA1C 6.2 05/11/2021    Assessment & Plan    1.  ***  No BP recorded.  {Refresh Note OR Click here to enter BP  :1}***   Rip Harbour, NP 08/20/2022, 6:16 PM

## 2022-08-21 ENCOUNTER — Encounter: Payer: Self-pay | Admitting: Nurse Practitioner

## 2022-08-21 ENCOUNTER — Other Ambulatory Visit
Admission: RE | Admit: 2022-08-21 | Discharge: 2022-08-21 | Disposition: A | Discharge status: Home / Self Care | Payer: Medicare Other | Source: Ambulatory Visit | Attending: Cardiology | Admitting: Cardiology

## 2022-08-21 ENCOUNTER — Ambulatory Visit: Payer: Medicare Other | Attending: Nurse Practitioner | Admitting: Cardiology

## 2022-08-21 VITALS — BP 110/60 | HR 94 | Ht 61.0 in | Wt 126.1 lb

## 2022-08-21 DIAGNOSIS — K219 Gastro-esophageal reflux disease without esophagitis: Secondary | ICD-10-CM | POA: Diagnosis not present

## 2022-08-21 DIAGNOSIS — Z87898 Personal history of other specified conditions: Secondary | ICD-10-CM | POA: Diagnosis not present

## 2022-08-21 DIAGNOSIS — J849 Interstitial pulmonary disease, unspecified: Secondary | ICD-10-CM | POA: Diagnosis not present

## 2022-08-21 DIAGNOSIS — I10 Essential (primary) hypertension: Secondary | ICD-10-CM

## 2022-08-21 DIAGNOSIS — I25118 Atherosclerotic heart disease of native coronary artery with other forms of angina pectoris: Secondary | ICD-10-CM | POA: Insufficient documentation

## 2022-08-21 DIAGNOSIS — E782 Mixed hyperlipidemia: Secondary | ICD-10-CM

## 2022-08-21 LAB — BASIC METABOLIC PANEL
Anion gap: 10 (ref 5–15)
BUN: 13 mg/dL (ref 8–23)
CO2: 24 mmol/L (ref 22–32)
Calcium: 7.8 mg/dL — ABNORMAL LOW (ref 8.9–10.3)
Chloride: 104 mmol/L (ref 98–111)
Creatinine, Ser: 0.97 mg/dL (ref 0.44–1.00)
GFR, Estimated: 56 mL/min — ABNORMAL LOW (ref >60)
Glucose, Bld: 103 mg/dL — ABNORMAL HIGH (ref 70–99)
Potassium: 4.4 mmol/L (ref 3.5–5.1)
Sodium: 138 mmol/L (ref 135–145)

## 2022-08-21 LAB — CBC
HCT: 32.4 % — ABNORMAL LOW (ref 36.0–46.0)
Hemoglobin: 10 g/dL — ABNORMAL LOW (ref 12.0–15.0)
MCH: 24.2 pg — ABNORMAL LOW (ref 26.0–34.0)
MCHC: 30.9 g/dL (ref 30.0–36.0)
MCV: 78.5 fL — ABNORMAL LOW (ref 80.0–100.0)
Platelets: 376 10*3/uL (ref 150–400)
RBC: 4.13 MIL/uL (ref 3.87–5.11)
RDW: 17.3 % — ABNORMAL HIGH (ref 11.5–15.5)
WBC: 8.4 10*3/uL (ref 4.0–10.5)
nRBC: 0 % (ref 0.0–0.2)

## 2022-08-21 LAB — TSH: TSH: 1.407 u[IU]/mL (ref 0.350–4.500)

## 2022-08-21 MED ORDER — OMEPRAZOLE 20 MG PO CPDR: 20.0000 mg | DELAYED_RELEASE_CAPSULE | Freq: Two times a day (BID) | ORAL | 1 refills | Status: DC

## 2022-08-21 NOTE — Patient Instructions (Signed)
Medication Instructions:  INCREASE the Omeprazole to 20 mg twice daily  You can take over the counter Mylanta for indigestion  *If you need a refill on your cardiac medications before your next appointment, please call your pharmacy*   Lab Work: Your provider would like for you to have following labs drawn: BMET, CBC and TSH.   Please go to the [facility] entrance and check in at the front desk.  You do not need an appointment.  They are open from 7am-6 pm.   If you have labs (blood work) drawn today and your tests are completely normal, you will receive your results only by: MyChart Message (if you have MyChart) OR A paper copy in the mail If you have any lab test that is abnormal or we need to change your treatment, we will call you to review the results.   Testing/Procedures: Your provider has ordered a Lexiscan Myoview Stress test. This will take place at [facility]. Please report to the [facility] medical mall entrance. The volunteers at the first desk will direct you where to go.  [facility] MYOVIEW  Your provider has ordered a Stress Test with nuclear imaging. The purpose of this test is to evaluate the blood supply to your heart muscle. This procedure is referred to as a "Non-Invasive Stress Test." This is because other than having an IV started in your vein, nothing is inserted or "invades" your body. Cardiac stress tests are done to find areas of poor blood flow to the heart by determining the extent of coronary artery disease (CAD). Some patients exercise on a treadmill, which naturally increases the blood flow to your heart, while others who are unable to walk on a treadmill due to physical limitations will have a pharmacologic/chemical stress agent called Lexiscan . This medicine will mimic walking on a treadmill by temporarily increasing your coronary blood flow.   Please note: these test may take anywhere between 2-4 hours to complete  How to prepare for your Myoview test:  Nothing  to eat for 6 hours prior to the test No caffeine for 24 hours prior to test No smoking 24 hours prior to test. Your medication may be taken with water.  If your doctor stopped a medication because of this test, do not take that medication. Ladies, please do not wear dresses.  Skirts or pants are appropriate. Please wear a short sleeve shirt. No perfume, cologne or lotion. Wear comfortable walking shoes. No heels!   PLEASE NOTIFY THE OFFICE AT LEAST 24 HOURS IN ADVANCE IF YOU ARE UNABLE TO KEEP YOUR APPOINTMENT.  [phone] AND  PLEASE NOTIFY NUCLEAR MEDICINE AT [facility] AT LEAST 24 HOURS IN ADVANCE IF YOU ARE UNABLE TO KEEP YOUR APPOINTMENT. [phone]    Follow-Up: At [facility], you and your health needs are our priority.  As part of our continuing mission to provide you with exceptional heart care, we have created designated Provider Care Teams.  These Care Teams include your primary Cardiologist (physician) and Advanced Practice Providers (APPs -  Physician Assistants and Nurse Practitioners) who all work together to provide you with the care you need, when you need it.  We recommend signing up for the patient portal called "MyChart".  Sign up information is provided on this After Visit Summary.  MyChart is used to connect with patients for Virtual Visits (Telemedicine).  Patients are able to view lab/test results, encounter notes, upcoming appointments, etc.  Non-urgent messages can be sent to your provider as well.  To learn more about what you can do with MyChart, go to [email or web address].    Your next appointment:   3-4 week(s)  Provider:   You may see Julien Nordmann, MD or one of the following Advanced Practice Providers on your designated Care Team:   Nicolasa Ducking, NP Eula Listen, PA-C Cadence Fransico Michael, PA-C Charlsie Quest, NP

## 2022-08-23 ENCOUNTER — Encounter: Payer: Self-pay | Admitting: Cardiology

## 2022-08-23 ENCOUNTER — Telehealth: Payer: Self-pay | Admitting: Internal Medicine

## 2022-08-23 NOTE — Telephone Encounter (Signed)
[phone] is Dewayne Hatch PT's Daughter  Calling to go over results of test. CT Scan.  They got a letter.  Also, PT was supposed to have had an inhaler but it was too much. Has a PA been started. Please address this with daughter as well.

## 2022-08-23 NOTE — Progress Notes (Signed)
Received an epic chat from Lindell Spar, RT on 08/23/22 that Ms. Guzy no longer wanted to proceed with her Steffanie Dunn given her age.  Order canceled.

## 2022-08-26 ENCOUNTER — Ambulatory Visit: Payer: Medicare Other

## 2022-08-26 ENCOUNTER — Other Ambulatory Visit (HOSPITAL_COMMUNITY): Payer: Self-pay

## 2022-08-26 NOTE — Telephone Encounter (Signed)
Patient has a deductible to meet before any prices go down for her inhalers.

## 2022-08-27 NOTE — Telephone Encounter (Signed)
Spoke to patient and relayed below message. She is questioning how much the deductible is.  PA team, can you guys assist with this?

## 2022-08-28 NOTE — Telephone Encounter (Signed)
Patient would have to reach out to insurance for exact numbers and more information on her plan.

## 2022-08-28 NOTE — Telephone Encounter (Signed)
Spoke to to daughter and relayed the message from our prior auth team. Nothing further needed.

## 2022-09-09 NOTE — Progress Notes (Unsigned)
Cardiology Office Note  Date:  09/10/2022   ID:  Jenna Young, DOB 09-Apr-1931, MRN [id number]  PCP:  Judy Pimple, MD   Chief Complaint  Patient presents with   3-4 week follow up     Patient c/o chest pain/indigestion that comes and goes. Medications reviewed by the patient verbally.     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 who follows up today for her hypertension, near syncope/syncope,  stable angina  LOV 1/24 Seen by one of our providers June 2024 Reported having some chest discomfort Omeprazole increased up to 20 twice daily Myoview ordered but not completed, she declined  On further discussion today reports most of her symptoms are in the evening after dinner she ate Horseradish/onions, had severe reaction approximately 3 weeks ago with burning in her chest, symptoms of the evening have persisted since that time worse when bending over, sometimes worse when laying in bed, using more pillows to sit up  Chest pain better with mylanta Reports Mylanta works better than nitro for relief of her pain  Family has been living with her recently and confirms the above that Mylanta makes her symptoms better Son and daughter live nearby  [place] Crestor 10  EKG personally reviewed by myself on todays visit EKG Interpretation Date/Time:  Tuesday September 10 2022 15:07:56 EDT Ventricular Rate:  88 PR Interval:  118 QRS Duration:  138 QT Interval:  404 QTC Calculation: 488 R Axis:   -28  Text Interpretation: Sinus rhythm with occasional Premature ventricular complexes Left bundle branch block When compared with ECG of 28-Jun-2021 09:13, Premature ventricular complexes are now Present Confirmed by Julien Nordmann [id number]) on 09/10/2022 3:39:21 PM    Several episodes of near syncope/syncope in 2021  NSTEMI 05/2019 cardiac catheterization showing occluded RCA with collaterals from left to right  Long hx of anemia, HGB 11 Iron infusions x2 Follows up with heme  Syncope 2020  on Saturday, was in the garden, 11 AM Reports that she was carrying some pot plants outside, started to feel dizzy Made her way inside but was dizzy, finally sat down inside Sister presents with her today reports that she had syncope per the son who was there with her Had bowel  incontinence Did not hurt herself  06/13/2019, reports that she had episode of syncope Ate some toast, then went shopping Maybe had some tea when she was out on the road Came back home, family was there, immediately started cooking Was in the kitchen when she felt weird, saw black spots, had to sit down Became unresponsive on the couch Family later on the floor Could not find pulse Blood pressure 61/40s, pulse ? EMTs arrived, she came too  Chronic arm weakness started in March 2020 Stop the statin on her own symptoms did not get better Arm discomfort when moving the garbage can, raising her arms for long periods.  They feel tired  Prior episode of syncope while at the beauty parlor Had hair done, got up to a when she felt lightheaded and passed out  She did not fall or sustain any injuries from this event.   When EMS arrived patient's blood glucose was 137 and her blood pressure was 74/43 for which she received 500 cc of fluid.  In the hospital, TNT 2.48  started on heparin, medical management No cardiac catheterization performed as numbers were trending down and she was asymptomatic Etiology of the events unclear whether this was vasovagal, arrhythmia,  Lab work did not appear to be indicative of dehydration  hospital Admission for NSTEMI , 02/10/2017 D/c 02/13/2017  Cath Significant mid RCA disease with sequential, calcified 80 and 95% stenoses. Element of thrombus is  likely also present. 90% ostial stenosis involving small OM1 branch. Mildly reduced left ventricular contraction with inferior hypokinesis (LVEF ~45%). Mildly elevated left ventricular filling pressure. Unsuccessful PCI to mid RCA due to inability to cross the mid RCA stenosis despite aggressive buddy wire support.  Recommendation : if she has unstable angina symptoms could consider PCI with atherectomy to mid RCA   Echo 02/2017 Systolic function was normal. The   estimated ejection fraction was in the range of 50% to 55%.   Hypokinesis of the inferior myocardium.  diagnosis of DCIS, had a biopsy bx Completed XRT   previously on amlodipine but wanted to change secondary to leg edema Started on bystolic Blood pressure continue to run high Started on HCTZ every other day  could not afford Bystolic long term   was changed to clonidine.  Presented to the emergency room December 31 2015 with near syncope , dehydration  "Fell asleep in the sun " while she was on a swing set, was difficult to arouse, was taken inside by family, had large bowel movement   in the emergency room heart rate 62 bpm , sodium down to 129, creatinine elevated Notes indicate she was only taking clonidine once a day   PMH:   has a past medical history of Allergic rhinitis, Anemia, Anxiety, CAD (coronary artery disease), Chronic HFrEF (heart failure with mid-range ejection fraction) (HCC), GERD (gastroesophageal reflux disease), History of radiation therapy (04/29/16- 05/24/16), Hyperlipidemia, Hypertension, Interstitial lung disease (HCC), Ischemic cardiomyopathy, Osteopenia, Osteoporosis, Personal history of radiation therapy (2018), and Syncope.  PSH:    Past Surgical History:  Procedure Laterality Date   APPENDECTOMY     BREAST BIOPSY Left 11/29/2008   BREAST BIOPSY Left 01/26/2016   malignant   BREAST EXCISIONAL BIOPSY Left 12/26/2008   BREAST LUMPECTOMY Left 02/27/2016   BREAST LUMPECTOMY Left 04/01/2016    BREAST LUMPECTOMY WITH RADIOACTIVE SEED LOCALIZATION Left 02/27/2016   Procedure: LEFT BREAST LUMPECTOMY WITH RADIOACTIVE SEED LOCALIZATION;  Surgeon: Chevis Pretty III, MD;  Location: [facility];  Service: General;  Laterality: Left;   BREAST SURGERY  10/10   benign biopsy  negative   CESAREAN SECTION     CHOLECYSTECTOMY     CORONARY STENT INTERVENTION N/A 02/11/2017   Procedure: CORONARY STENT INTERVENTION;  Surgeon: Yvonne Kendall, MD;  Location: [facility];  Service: Cardiovascular;  Laterality: N/A;   FEMUR FRACTURE SURGERY Right 2006   HIP ARTHROPLASTY Left 06/28/2021   Procedure: ARTHROPLASTY BIPOLAR HIP (HEMIARTHROPLASTY);  Surgeon: Ross Marcus, MD;  Location: [facility];  Service: Orthopedics;  Laterality: Left;   LEFT HEART CATH AND CORONARY ANGIOGRAPHY N/A 02/11/2017   Procedure: LEFT HEART CATH AND CORONARY ANGIOGRAPHY;  Surgeon: Yvonne Kendall, MD;  Location: [facility];  Service: Cardiovascular;  Laterality: N/A;   LEFT HEART CATH AND CORONARY ANGIOGRAPHY N/A 05/21/2019   Procedure: LEFT HEART CATH AND CORONARY ANGIOGRAPHY;  Surgeon: Iran Ouch, MD;  Location: [facility];  Service: Cardiovascular;  Laterality: N/A;   OVARIAN CYST SURGERY     RE-EXCISION OF BREAST LUMPECTOMY Left 04/01/2016  Procedure: RE-EXCISION OF LEFT BREAST MEDIAL MARGIN;  Surgeon: Chevis Pretty III, MD;  Location: [facility];  Service: General;  Laterality: Left;    Current Outpatient Medications  Medication Sig Dispense Refill   carvedilol (COREG) 3.125 MG tablet TAKE 1 TABLET(3.125 MG) BY MOUTH TWICE DAILY 60 tablet 11   Cholecalciferol (VITAMIN D) 2000 UNITS CAPS Take 1 capsule by mouth daily.     clopidogrel (PLAVIX) 75 MG tablet TAKE 1 TABLET(75 MG) BY MOUTH DAILY WITH BREAKFAST, crush pill if unable to swallow 90 tablet 3   cyanocobalamin (,VITAMIN B-12,) 1000 MCG/ML injection Inject 1,000 mcg into the muscle every 30 (thirty) days.      docusate sodium (COLACE) 100 MG capsule Take 2 capsules (200 mg total) by mouth 2 (two) times daily. 10 capsule 0   losartan (COZAAR) 100 MG tablet TAKE 1 TABLET(100 MG) BY MOUTH DAILY 90 tablet 3   meclizine (ANTIVERT) 25 MG tablet Take 25 mg by mouth 2 (two) times daily as needed.     Multiple Vitamin (MULTIVITAMIN WITH MINERALS) TABS tablet Take 1 tablet by mouth daily. 30 tablet 1   nitroGLYCERIN (NITROSTAT) 0.4 MG SL tablet PLACE 1 TABLET UNDER TONGUE EVERY 5 MIN AS NEEDED FOR CHEST PAIN IF NO RELIEF IN15 MIN CALL 911 (MAX 3 TABS) 25 tablet 1   omeprazole (PRILOSEC) 20 MG capsule Take 1 capsule (20 mg total) by mouth 2 (two) times daily before a meal. 180 capsule 1   rosuvastatin (CRESTOR) 10 MG tablet TAKE 1 TABLET(10 MG) BY MOUTH DAILY 90 tablet 2   Skin Protectants, Misc. (EUCERIN) cream Apply 1 application. topically as needed for dry skin.     fluticasone-salmeterol (ADVAIR HFA) 115-21 MCG/ACT inhaler Inhale 2 puffs into the lungs 2 (two) times daily. (Patient not taking: Reported on 08/21/2022) 1 each 5   fluticasone-salmeterol (ADVAIR HFA) 115-21 MCG/ACT inhaler Inhale 2 puffs into the lungs 2 (two) times daily. (Patient not taking: Reported on 08/21/2022) 1 each 12   No current facility-administered medications for this visit.     Allergies:   Buspirone hcl, Codeine, Hydrochlorothiazide, Iron, Isosorbide nitrate, Lansoprazole, Minocycline hcl, Oxycodone, Penicillins, Phenergan [promethazine hcl], Ramipril, Ranexa [ranolazine], Sulfa antibiotics, and Rosuvastatin   Social History:  The patient  reports that she quit smoking about 54 years ago. Her smoking use included cigarettes. She has a 10.00 pack-year smoking history. She has never used smokeless tobacco. She reports that she does not drink alcohol and does not use drugs.   Family History:   family history includes Cancer in her maternal aunt; Diabetes in her maternal aunt; Heart disease in her father; Hypertension in her mother;  Macular degeneration in her sister.   Review of Systems  HENT: Negative.    Eyes: Negative.   Respiratory: Negative.    Cardiovascular:  Positive for chest pain.  Gastrointestinal: Negative.   Genitourinary: Negative.   Musculoskeletal: Negative.   Neurological: Negative.   Psychiatric/Behavioral: Negative.    All other systems reviewed and are negative.   PHYSICAL EXAM: VS:  BP 136/60 (BP Location: Left Arm, Patient Position: Sitting, Cuff Size: Normal)   Pulse 88   Ht 5\' 1"  (1.549 m)   Wt 125 lb 4 oz (56.8 kg)   SpO2 98%   BMI 23.67 kg/m  , BMI Body mass index is 23.67 kg/m. Constitutional:  oriented to person, place, and time. No distress.  HENT:  Head: Grossly normal Eyes:  no discharge. No scleral icterus.  Neck: No JVD,  no carotid bruits  Cardiovascular: Regular rate and rhythm, no murmurs appreciated Pulmonary/Chest: Clear to auscultation bilaterally, no wheezes or rails Abdominal: Soft.  no distension.  no tenderness.  Musculoskeletal: Normal range of motion Neurological:  normal muscle tone. Coordination normal. No atrophy Skin: Skin warm and dry Psychiatric: normal affect, pleasant  Recent Labs: 08/21/2022: BUN 13; Creatinine, Ser 0.97; Hemoglobin 10.0; Platelets 376; Potassium 4.4; Sodium 138; TSH 1.407    Lipid Panel Lab Results  Component Value Date   CHOL 147 05/11/2021   HDL 62.80 05/11/2021   LDLCALC 65 05/11/2021   TRIG 98.0 05/11/2021      Wt Readings from Last 3 Encounters:  09/10/22 125 lb 4 oz (56.8 kg)  08/21/22 126 lb 2 oz (57.2 kg)  06/17/22 126 lb 3.2 oz (57.2 kg)     ASSESSMENT AND PLAN:  Coronary artery disease with stable angina catheterization with chronically occluded RCA collaterals from left to right Received seen by one of our colleagues, she declined stress test Reports symptoms of chest discomfort in the evenings coming on with bending over, laying in the bed often relieved with Mylanta which seems to work better than  nitro sublingual -Recommend she talk with Dr. Milinda Antis tomorrow concerning GERD regiment  GERD Symptoms seem to start after eating horseradish and onions 3 weeks ago Symptoms relieved with Mylanta Minimal improvement in symptoms by increasing omeprazole up to 20 twice daily Recommend she discuss with Dr. Milinda Antis to see if alternate PPI might be needed  Syncope No further epsiodes Will allow liberal blood pressure No changes to blood pressure medications Off isosorbide after prior near-syncope/syncope  Essential hypertension, benign -  Blood pressure is well controlled on today's visit. No changes made to the medications.  Pure hypercholesterolemia Continue crestor 10, numbers at goal Myalgias on Lipitor  Ductal carcinoma in situ (DCIS) of left breast Treatment is complete, radiation to the left    Total encounter time more than 40 minutes  Greater than 50% was spent in counseling and coordination of care with the patient    Orders Placed This Encounter  Procedures   EKG 12-Lead    Signed, Dossie Arbour, M.D., Ph.D. 09/10/2022  [facility] [place], [place] [phone]

## 2022-09-10 ENCOUNTER — Encounter: Payer: Self-pay | Admitting: Cardiovascular Disease

## 2022-09-10 ENCOUNTER — Ambulatory Visit: Payer: Medicare Other | Attending: Nurse Practitioner | Admitting: Cardiovascular Disease

## 2022-09-10 VITALS — BP 136/60 | HR 88 | Ht 61.0 in | Wt 125.2 lb

## 2022-09-10 DIAGNOSIS — E785 Hyperlipidemia, unspecified: Secondary | ICD-10-CM | POA: Diagnosis not present

## 2022-09-10 DIAGNOSIS — D649 Anemia, unspecified: Secondary | ICD-10-CM | POA: Diagnosis not present

## 2022-09-10 DIAGNOSIS — T466X5A Adverse effect of antihyperlipidemic and antiarteriosclerotic drugs, initial encounter: Secondary | ICD-10-CM | POA: Diagnosis not present

## 2022-09-10 DIAGNOSIS — T466X5D Adverse effect of antihyperlipidemic and antiarteriosclerotic drugs, subsequent encounter: Secondary | ICD-10-CM

## 2022-09-10 DIAGNOSIS — Z87898 Personal history of other specified conditions: Secondary | ICD-10-CM

## 2022-09-10 DIAGNOSIS — I10 Essential (primary) hypertension: Secondary | ICD-10-CM

## 2022-09-10 DIAGNOSIS — E782 Mixed hyperlipidemia: Secondary | ICD-10-CM

## 2022-09-10 DIAGNOSIS — I25118 Atherosclerotic heart disease of native coronary artery with other forms of angina pectoris: Secondary | ICD-10-CM | POA: Diagnosis not present

## 2022-09-10 DIAGNOSIS — M791 Myalgia, unspecified site: Secondary | ICD-10-CM

## 2022-09-10 DIAGNOSIS — K219 Gastro-esophageal reflux disease without esophagitis: Secondary | ICD-10-CM | POA: Diagnosis not present

## 2022-09-10 DIAGNOSIS — I5022 Chronic systolic (congestive) heart failure: Secondary | ICD-10-CM | POA: Diagnosis not present

## 2022-09-10 DIAGNOSIS — J849 Interstitial pulmonary disease, unspecified: Secondary | ICD-10-CM | POA: Diagnosis not present

## 2022-09-10 NOTE — Patient Instructions (Addendum)
Medication Instructions:  No changes  If you need a refill on your cardiac medications before your next appointment, please call your pharmacy.    Lab work: No new labs needed   Testing/Procedures: No new testing needed   Follow-Up: At [facility], you and your health needs are our priority.  As part of our continuing mission to provide you with exceptional heart care, we have created designated Provider Care Teams.  These Care Teams include your primary Cardiologist (physician) and Advanced Practice Providers (APPs -  Physician Assistants and Nurse Practitioners) who all work together to provide you with the care you need, when you need it.  You will need a follow up appointment in 6 months  Providers on your designated Care Team:   Christopher Berge, NP Ryan Dunn, PA-C Cadence Furth, PA-C  COVID-19 Vaccine Information can be found at: [email or web address] For questions related to vaccine distribution or appointments, please email [email or web address] or call [phone].   

## 2022-09-11 ENCOUNTER — Ambulatory Visit (INDEPENDENT_AMBULATORY_CARE_PROVIDER_SITE_OTHER): Payer: Medicare Other | Admitting: Family Medicine

## 2022-09-11 ENCOUNTER — Encounter: Payer: Self-pay | Admitting: Family Medicine

## 2022-09-11 VITALS — BP 110/60 | HR 81 | Temp 98.6°F | Ht 61.0 in | Wt 124.2 lb

## 2022-09-11 DIAGNOSIS — F419 Anxiety disorder, unspecified: Secondary | ICD-10-CM

## 2022-09-11 DIAGNOSIS — K219 Gastro-esophageal reflux disease without esophagitis: Secondary | ICD-10-CM

## 2022-09-11 DIAGNOSIS — I25118 Atherosclerotic heart disease of native coronary artery with other forms of angina pectoris: Secondary | ICD-10-CM | POA: Diagnosis not present

## 2022-09-11 MED ORDER — FAMOTIDINE 40 MG/5ML PO SUSR: 20.0000 mg | Freq: Two times a day (BID) | ORAL | 0 refills | Status: AC

## 2022-09-11 NOTE — Patient Instructions (Signed)
Continue omeprazole Take first dose at least 30 minutes before food/drink or other medicines  Second dose can be in the evening   I want to add generic of pepcid (famotidine)  2.5 ml mid morning and bedtime   Use a wedge to help elevate the head of your bed  Also ok to sleep in recliner   Mylanta is ok as needed   Avoid foods that trigger you    I do want you to see a GI specialist  Let me know if/when you are open to that    If symptoms worsen let us know right away  If not improving in a month let us know

## 2022-09-11 NOTE — Progress Notes (Unsigned)
Subjective:    Patient ID: Jenna Young, female    DOB: 05-11-1931, 89 y.o.   MRN: [id number]  HPI  Wt Readings from Last 3 Encounters:  09/11/22 124 lb 3.2 oz (56.3 kg)  09/10/22 125 lb 4 oz (56.8 kg)  08/21/22 126 lb 2 oz (57.2 kg)   23.47 kg/m  Vitals:   09/11/22 1126  BP: 110/60  Pulse: 81  Temp: 98.6 F (37 C)  SpO2: 98%     Pt presents for c/o anxious mood  Also GERD   Was seen with cardiology recently for CAD and HTN and past syncope   A/p as follows Coronary artery disease with stable angina catheterization with chronically occluded RCA collaterals from left to right Received seen by one of our colleagues, she declined stress test Reports symptoms of chest discomfort in the evenings coming on with bending over, laying in the bed often relieved with Mylanta which seems to work better than nitro sublingual -Recommend she talk with Dr. Milinda Antis tomorrow concerning GERD regiment   Had recently eaten horseradish and onions   Symptoms worsen when she bends over  Also lying down   Takes omeprazole 20 mg bid   Was told to take mylanta twice daily -helps a lot   She uses peppermint and thinkgs it helps   Burning feeling in center and lower chest  No stomach pain  No nausea no vomiting No diarrhea   Over a month   She feels anxious when this happens   Drinks decaf coffee  No nsaids  She stopped drinking tea and lemonade  Also stopped chocolate   Pills get stuck   Was on steroid for a month for lung issues due to mold in house  Sees pulmonary    Patient Active Problem List   Diagnosis Date Noted   Interstitial lung disease (HCC) 08/21/2022   Abnormal CXR 04/19/2022   History of hip fracture 11/19/2021   Poor balance 05/18/2021   Great toe pain, right 05/18/2021   Urinary frequency 03/15/2021   Intertrigo labialis 03/15/2021   Frequent urination 08/24/2020   History of COVID-19 08/03/2020   Dermatitis 05/03/2020   Secondary  hypertension    Iron deficiency anemia 05/12/2019   Prediabetes 04/15/2018   Encounter for screening mammogram for breast cancer 04/01/2017   H/O syncope 02/28/2017   Coronary artery disease of native artery of native heart with stable angina pectoris (HCC) 02/21/2017   History of non-ST elevation myocardial infarction (NSTEMI) 02/10/2017   History of breast cancer 02/26/2016   Varicose vein of leg 10/09/2015   Fatigue 10/09/2015   Pedal edema 10/09/2015   Heart murmur 10/09/2015   History of spinal fracture 05/07/2015   Dysthymia 09/20/2013   Encounter for Medicare annual wellness exam 02/03/2013   Viral URI with cough 03/06/2011   Vitamin D deficiency 08/19/2008   ANXIETY 01/29/2008   Essential hypertension, benign 09/25/2007   B12 deficiency 09/24/2006   Hyperlipidemia 09/24/2006   Venous (peripheral) insufficiency 09/24/2006   ALLERGIC RHINITIS 09/24/2006   GERD 09/24/2006   Osteoporosis 09/24/2006   Past Medical History:  Diagnosis Date   Allergic rhinitis    Anemia    Anxiety    CAD (coronary artery disease)    a. 02/2017 Cath: OM1 90, RCA 30p, 66m, 95d (unsuccessful PCI->Med Rx); b. 05/2019 NSTEMI/Cath: LM nl, LAD mild diff dzs, LCX mild diff dzs, OM1 100 - fills via collats from OM2, RCA 30p, 57m, 151m, RPDA fills via collats from dLAD.  Chronic HFrEF (heart failure with mid-range ejection fraction) (HCC)    a. 05/2019 Echo: EF 40-45%, mild LVH, GrI DD, nl RV fxn, mildly dil LA, triv MR/AI.   GERD (gastroesophageal reflux disease)    History of radiation therapy 04/29/16- 05/24/16   Left Breast 40.05 Gy in 15 fractions, Left Breast boost 10 Gy in 5 fractions.    Hyperlipidemia    Hypertension    treated by Dr Eulis Canner recently with syncope episode   Interstitial lung disease (HCC)    Ischemic cardiomyopathy    Osteopenia    Osteoporosis    Personal history of radiation therapy 2018   Syncope    Past Surgical History:  Procedure Laterality Date    APPENDECTOMY     BREAST BIOPSY Left 11/29/2008   BREAST BIOPSY Left 01/26/2016   malignant   BREAST EXCISIONAL BIOPSY Left 12/26/2008   BREAST LUMPECTOMY Left 02/27/2016   BREAST LUMPECTOMY Left 04/01/2016   BREAST LUMPECTOMY WITH RADIOACTIVE SEED LOCALIZATION Left 02/27/2016   Procedure: LEFT BREAST LUMPECTOMY WITH RADIOACTIVE SEED LOCALIZATION;  Surgeon: Chevis Pretty III, MD;  Location: [facility];  Service: General;  Laterality: Left;   BREAST SURGERY  10/10   benign biopsy  negative   CESAREAN SECTION     CHOLECYSTECTOMY     CORONARY STENT INTERVENTION N/A 02/11/2017   Procedure: CORONARY STENT INTERVENTION;  Surgeon: Yvonne Kendall, MD;  Location: [facility];  Service: Cardiovascular;  Laterality: N/A;   FEMUR FRACTURE SURGERY Right 2006   HIP ARTHROPLASTY Left 06/28/2021   Procedure: ARTHROPLASTY BIPOLAR HIP (HEMIARTHROPLASTY);  Surgeon: Ross Marcus, MD;  Location: [facility];  Service: Orthopedics;  Laterality: Left;   LEFT HEART CATH AND CORONARY ANGIOGRAPHY N/A 02/11/2017   Procedure: LEFT HEART CATH AND CORONARY ANGIOGRAPHY;  Surgeon: Yvonne Kendall, MD;  Location: [facility];  Service: Cardiovascular;  Laterality: N/A;   LEFT HEART CATH AND CORONARY ANGIOGRAPHY N/A 05/21/2019   Procedure: LEFT HEART CATH AND CORONARY ANGIOGRAPHY;  Surgeon: Iran Ouch, MD;  Location: [facility];  Service: Cardiovascular;  Laterality: N/A;   OVARIAN CYST SURGERY     RE-EXCISION OF BREAST LUMPECTOMY Left 04/01/2016   Procedure: RE-EXCISION OF LEFT BREAST MEDIAL MARGIN;  Surgeon: Chevis Pretty III, MD;  Location: [facility];  Service: General;  Laterality: Left;   Social History   Tobacco Use   Smoking status: Former    Packs/day: 1.00    Years: 10.00    Additional pack years: 0.00    Total pack years: 10.00    Types: Cigarettes    Quit date: 03/11/1968    Years since quitting: 54.5   Smokeless tobacco: Never  Vaping Use    Vaping Use: Never used  Substance Use Topics   Alcohol use: No    Alcohol/week: 0.0 standard drinks of alcohol   Drug use: No   Family History  Problem Relation Age of Onset   Hypertension Mother    Heart disease Father    Macular degeneration Sister    Diabetes Maternal Aunt    Cancer Maternal Aunt        throat   Allergies  Allergen Reactions   Buspirone Hcl     REACTION: HEART PALPITATIONS   Codeine     REACTION: nausea and vomiting   Hydrochlorothiazide     REACTION: syncope-possibly from dehydration   Iron     Oral iron -per pt made her "pass out"    Isosorbide Nitrate  Other (See Comments)    hypotension   Lansoprazole     REACTION: abd pain   Minocycline Hcl    Oxycodone Other (See Comments)    Hallucinations    Penicillins     REACTION: mouth numbness Has patient had a PCN reaction causing immediate rash, facial/tongue/throat swelling, SOB or lightheadedness with hypotension: No Has patient had a PCN reaction causing severe rash involving mucus membranes or skin necrosis: No Has patient had a PCN reaction that required hospitalization: No Has patient had a PCN reaction occurring within the last 10 years: No If all of the above answers are "NO", then may proceed with Cephalosporin use.   Phenergan [Promethazine Hcl] Other (See Comments)    Body shakes, hallucinations   Ramipril     Cough    Ranexa [Ranolazine]     Constipation    Sulfa Antibiotics Nausea Only   Rosuvastatin     Patient reports muscle pain, unable to take. Stopped around 07/22.   Current Outpatient Medications on File Prior to Visit  Medication Sig Dispense Refill   carvedilol (COREG) 3.125 MG tablet TAKE 1 TABLET(3.125 MG) BY MOUTH TWICE DAILY 60 tablet 11   Cholecalciferol (VITAMIN D) 2000 UNITS CAPS Take 1 capsule by mouth daily.     clopidogrel (PLAVIX) 75 MG tablet TAKE 1 TABLET(75 MG) BY MOUTH DAILY WITH BREAKFAST, crush pill if unable to swallow 90 tablet 3   cyanocobalamin (,VITAMIN  B-12,) 1000 MCG/ML injection Inject 1,000 mcg into the muscle every 30 (thirty) days.     docusate sodium (COLACE) 100 MG capsule Take 2 capsules (200 mg total) by mouth 2 (two) times daily. 10 capsule 0   losartan (COZAAR) 100 MG tablet TAKE 1 TABLET(100 MG) BY MOUTH DAILY 90 tablet 3   meclizine (ANTIVERT) 25 MG tablet Take 25 mg by mouth 2 (two) times daily as needed.     Multiple Vitamin (MULTIVITAMIN WITH MINERALS) TABS tablet Take 1 tablet by mouth daily. 30 tablet 1   nitroGLYCERIN (NITROSTAT) 0.4 MG SL tablet PLACE 1 TABLET UNDER TONGUE EVERY 5 MIN AS NEEDED FOR CHEST PAIN IF NO RELIEF IN15 MIN CALL 911 (MAX 3 TABS) 25 tablet 1   omeprazole (PRILOSEC) 20 MG capsule Take 1 capsule (20 mg total) by mouth 2 (two) times daily before a meal. 180 capsule 1   rosuvastatin (CRESTOR) 10 MG tablet TAKE 1 TABLET(10 MG) BY MOUTH DAILY 90 tablet 2   Skin Protectants, Misc. (EUCERIN) cream Apply 1 application. topically as needed for dry skin.     fluticasone-salmeterol (ADVAIR HFA) 115-21 MCG/ACT inhaler Inhale 2 puffs into the lungs 2 (two) times daily. (Patient not taking: Reported on 08/21/2022) 1 each 5   fluticasone-salmeterol (ADVAIR HFA) 115-21 MCG/ACT inhaler Inhale 2 puffs into the lungs 2 (two) times daily. (Patient not taking: Reported on 09/11/2022) 1 each 12   No current facility-administered medications on file prior to visit.    Review of Systems     Objective:   Physical Exam        Assessment & Plan:   Problem List Items Addressed This Visit   None

## 2022-09-12 NOTE — Assessment & Plan Note (Signed)
Pt is having chest discomfort and burning -worse when lying down or bending over  Reviewed cardiology notes today (cardiologist reached out to [place] yesterday)  This makes her anxious Relieved by mylanta  Already takes omeprazole 20 mg bid   Discussed diet and handout given  Suggested a wedge for bed to prop up for sleep Added pepcid 20 mg bid  She also has some pill dysphagia - feel she needs GI ref to consider EGD but she declines  Pt wants to see if this works first  Long discussion about this  Will follow up to discuss anxiety once we see how this improves

## 2022-09-12 NOTE — Assessment & Plan Note (Signed)
Reviewed last cardiology visit  Declines testing currently  Thinks chest pain is from GERD

## 2022-09-12 NOTE — Assessment & Plan Note (Signed)
The chest pain from GERD is making her feel more anxious  Plan to treat this more aggressively and follow closely   Will follow up to discuss mood din more detail if this does not improve or worsens   Encouraged good self care

## 2022-09-20 ENCOUNTER — Telehealth: Payer: Self-pay | Admitting: Student in an Organized Health Care Education/Training Program

## 2022-09-20 ENCOUNTER — Ambulatory Visit: Payer: Medicare Other | Admitting: Student in an Organized Health Care Education/Training Program

## 2022-09-20 ENCOUNTER — Ambulatory Visit: Payer: Medicare Other | Admitting: Nurse Practitioner

## 2022-09-20 NOTE — Telephone Encounter (Signed)
Pt daughter calling in w a few questions for provider about a inhaler pt couldn't afford

## 2022-09-20 NOTE — Telephone Encounter (Signed)
I spoke with the patient's daughter(DPR), she said they still have not been able to get her inhaler. The Advair was too much and the generic was $201, which is still too much. When they contacted the insurance they were told all the inhalers will be expensive because she has a $600 deductible. Is there anything else you can give her?

## 2022-09-24 ENCOUNTER — Ambulatory Visit (INDEPENDENT_AMBULATORY_CARE_PROVIDER_SITE_OTHER): Payer: Medicare Other

## 2022-09-24 DIAGNOSIS — E538 Deficiency of other specified B group vitamins: Secondary | ICD-10-CM | POA: Diagnosis not present

## 2022-09-24 MED ORDER — CYANOCOBALAMIN 1000 MCG/ML IJ SOLN
1000.0000 ug | Freq: Once | INTRAMUSCULAR | Status: AC
Start: 2022-09-24 — End: 2022-09-24
  Administered 2022-09-24: 1000 ug via INTRAMUSCULAR

## 2022-09-24 NOTE — Telephone Encounter (Signed)
Would you like to try [facility] or Trelegy?

## 2022-09-24 NOTE — Telephone Encounter (Signed)
Patient's daughter, Ann(DPR) is aware of below message and voiced her understanding.  She wanted to make Dr. Aundria Rud aware that patient is not currently taking any inhalers. She is questioning if there is a patient assistance program that would help with cost of medication.   Dr. Aundria Rud, would you like to try [employer] or [employer]?

## 2022-09-24 NOTE — Telephone Encounter (Signed)
Yes, please. That would be great!

## 2022-09-24 NOTE — Telephone Encounter (Signed)
Per Dr. Aundria Rud- can prescribe breztri and provide patient assistance forms.   Lm for patient's daughter, Ann(DPR).

## 2022-09-24 NOTE — Progress Notes (Signed)
After obtaining consent, and per orders of Dr. Milinda Antis, injection of B-12 given IM in left deltoid by Valentino Nose. Patient tolerated injection well.

## 2022-09-25 MED ORDER — BREZTRI AEROSPHERE 160-9-4.8 MCG/ACT IN AERO: 2.0000 | INHALATION_SPRAY | Freq: Two times a day (BID) | RESPIRATORY_TRACT | Status: AC

## 2022-09-25 NOTE — Telephone Encounter (Signed)
Spoke to patient's daughter, Ann(DPR). She is aware that sample of breztri and patient assistance forms have been placed up front for pickup.

## 2022-10-01 NOTE — Telephone Encounter (Signed)
Sample and form has not been pickup.   Spoke to patient for update. She stated that she would try to come by today or tomorrow.

## 2022-10-02 NOTE — Telephone Encounter (Signed)
Patient picked up sample and forms yesterday.  She will complete forms and bring them back to our office.

## 2022-10-04 NOTE — Telephone Encounter (Signed)
ATC the patient. LVM for the patient to call our office back. Checking on the form completion.

## 2022-10-07 DIAGNOSIS — Z961 Presence of intraocular lens: Secondary | ICD-10-CM | POA: Diagnosis not present

## 2022-10-07 DIAGNOSIS — H524 Presbyopia: Secondary | ICD-10-CM | POA: Diagnosis not present

## 2022-10-07 DIAGNOSIS — H16142 Punctate keratitis, left eye: Secondary | ICD-10-CM | POA: Diagnosis not present

## 2022-10-07 DIAGNOSIS — H02055 Trichiasis without entropian left lower eyelid: Secondary | ICD-10-CM | POA: Diagnosis not present

## 2022-10-07 DIAGNOSIS — H43813 Vitreous degeneration, bilateral: Secondary | ICD-10-CM | POA: Diagnosis not present

## 2022-10-07 DIAGNOSIS — H5203 Hypermetropia, bilateral: Secondary | ICD-10-CM | POA: Diagnosis not present

## 2022-10-07 DIAGNOSIS — H52223 Regular astigmatism, bilateral: Secondary | ICD-10-CM | POA: Diagnosis not present

## 2022-10-07 NOTE — Telephone Encounter (Signed)
Lm x2 for update.

## 2022-10-08 NOTE — Telephone Encounter (Signed)
Lm x3 for patient.  Will close encounter per office protocol.   

## 2022-10-17 ENCOUNTER — Ambulatory Visit: Payer: Medicare Other

## 2022-10-17 ENCOUNTER — Ambulatory Visit: Payer: Medicare Other | Admitting: Family Medicine

## 2022-10-18 ENCOUNTER — Ambulatory Visit: Payer: Medicare Other | Admitting: Family Medicine

## 2022-10-18 ENCOUNTER — Encounter: Payer: Self-pay | Admitting: Family Medicine

## 2022-10-18 VITALS — BP 125/65 | HR 78 | Temp 97.6°F | Ht 61.0 in | Wt 124.4 lb

## 2022-10-18 DIAGNOSIS — J069 Acute upper respiratory infection, unspecified: Secondary | ICD-10-CM

## 2022-10-18 DIAGNOSIS — E538 Deficiency of other specified B group vitamins: Secondary | ICD-10-CM | POA: Diagnosis not present

## 2022-10-18 LAB — POC COVID19 BINAXNOW: SARS Coronavirus 2 Ag: NEGATIVE

## 2022-10-18 NOTE — Patient Instructions (Addendum)
I think you have a head cold   You can try claritin for runny nose and drip Lots of fluids and rest  Watch for fever or increased cough or headache- let us know  Covid test is negative   Update if not starting to improve in a week or if worsening    If any severe symptoms like trouble breathing go to the ER

## 2022-10-18 NOTE — Assessment & Plan Note (Signed)
Fairly mild  Nasal symptoms  Covid negative  Reassuring exam  Reviewed symptoms care= see AVS Update if not starting to improve in a week or if worsening  Call back and Er precautions noted in detail today   Handout given

## 2022-10-18 NOTE — Assessment & Plan Note (Signed)
Not due for B12 shot until after 8/16 Will schedule nurse visit

## 2022-10-18 NOTE — Progress Notes (Signed)
Subjective:    Patient ID: Jenna Young, female    DOB: [birth year], 89 y.o.   MRN: [id number]  HPI  Wt Readings from Last 3 Encounters:  10/18/22 124 lb 6 oz (56.4 kg)  09/11/22 124 lb 3.2 oz (56.3 kg)  09/10/22 125 lb 4 oz (56.8 kg)   23.50 kg/m  Vitals:   10/18/22 1109 10/18/22 1126  BP: (!) 148/80 125/65  Pulse: 78   Temp: 97.6 F (36.4 C)   SpO2: 94%     Pt presents for uri symptoms  She has a history of interstitial lung disease   Very tired for a week  Getting a little better   A lot of nasal congestion  Mucous is clear  Not a lot of cough  Breathing if fine  No fever   No ear or throat pain  No headache No facial pain   No n/vd  Appetite is not great   No aches or chills Just weak and tired   Exposed to sick people at church    Covid testing today    Over the counter  Took some robitussin    Negative covid test today  Results for orders placed or performed in visit on 10/18/22  POC COVID-19 BinaxNow  Result Value Ref Range   SARS Coronavirus 2 Ag Negative Negative     Patient Active Problem List   Diagnosis Date Noted   Interstitial lung disease (HCC) 08/21/2022   Abnormal CXR 04/19/2022   History of hip fracture 11/19/2021   Poor balance 05/18/2021   Great toe pain, right 05/18/2021   Urinary frequency 03/15/2021   Intertrigo labialis 03/15/2021   Frequent urination 08/24/2020   History of COVID-19 08/03/2020   Dermatitis 05/03/2020   Secondary hypertension    Iron deficiency anemia 05/12/2019   Prediabetes 04/15/2018   Encounter for screening mammogram for breast cancer 04/01/2017   H/O syncope 02/28/2017   Coronary artery disease of native artery of native heart with stable angina pectoris (HCC) 02/21/2017   History of non-ST elevation myocardial infarction (NSTEMI) 02/10/2017   History of breast cancer 02/26/2016   Varicose vein of leg 10/09/2015   Fatigue 10/09/2015   Pedal edema 10/09/2015   Heart murmur  10/09/2015   History of spinal fracture 05/07/2015   Dysthymia 09/20/2013   Encounter for Medicare annual wellness exam 02/03/2013   Viral URI with cough 03/06/2011   Vitamin D deficiency 08/19/2008   Anxious mood 01/29/2008   Essential hypertension, benign 09/25/2007   B12 deficiency 09/24/2006   Hyperlipidemia 09/24/2006   Venous (peripheral) insufficiency 09/24/2006   ALLERGIC RHINITIS 09/24/2006   GERD 09/24/2006   Osteoporosis 09/24/2006   Past Medical History:  Diagnosis Date   Allergic rhinitis    Anemia    Anxiety    CAD (coronary artery disease)    a. 02/2017 Cath: OM1 90, RCA 30p, 22m, 95d (unsuccessful PCI->Med Rx); b. 05/2019 NSTEMI/Cath: LM nl, LAD mild diff dzs, LCX mild diff dzs, OM1 100 - fills via collats from OM2, RCA 30p, 33m, 134m, RPDA fills via collats from dLAD.   Chronic HFrEF (heart failure with mid-range ejection fraction) (HCC)    a. 05/2019 Echo: EF 40-45%, mild LVH, GrI DD, nl RV fxn, mildly dil LA, triv MR/AI.   GERD (gastroesophageal reflux disease)    History of radiation therapy 04/29/16- 05/24/16   Left Breast 40.05 Gy in 15 fractions, Left Breast boost 10 Gy in 5 fractions.    Hyperlipidemia  Hypertension    treated by Dr Eulis Canner recently with syncope episode   Interstitial lung disease (HCC)    Ischemic cardiomyopathy    Osteopenia    Osteoporosis    Personal history of radiation therapy 2018   Syncope    Past Surgical History:  Procedure Laterality Date   APPENDECTOMY     BREAST BIOPSY Left 11/29/2008   BREAST BIOPSY Left 01/26/2016   malignant   BREAST EXCISIONAL BIOPSY Left 12/26/2008   BREAST LUMPECTOMY Left 02/27/2016   BREAST LUMPECTOMY Left 04/01/2016   BREAST LUMPECTOMY WITH RADIOACTIVE SEED LOCALIZATION Left 02/27/2016   Procedure: LEFT BREAST LUMPECTOMY WITH RADIOACTIVE SEED LOCALIZATION;  Surgeon: Chevis Pretty III, MD;  Location: [facility];  Service: General;  Laterality: Left;   BREAST SURGERY  10/10    benign biopsy  negative   CESAREAN SECTION     CHOLECYSTECTOMY     CORONARY STENT INTERVENTION N/A 02/11/2017   Procedure: CORONARY STENT INTERVENTION;  Surgeon: Yvonne Kendall, MD;  Location: [facility];  Service: Cardiovascular;  Laterality: N/A;   FEMUR FRACTURE SURGERY Right 2006   HIP ARTHROPLASTY Left 06/28/2021   Procedure: ARTHROPLASTY BIPOLAR HIP (HEMIARTHROPLASTY);  Surgeon: Ross Marcus, MD;  Location: [facility];  Service: Orthopedics;  Laterality: Left;   LEFT HEART CATH AND CORONARY ANGIOGRAPHY N/A 02/11/2017   Procedure: LEFT HEART CATH AND CORONARY ANGIOGRAPHY;  Surgeon: Yvonne Kendall, MD;  Location: [facility];  Service: Cardiovascular;  Laterality: N/A;   LEFT HEART CATH AND CORONARY ANGIOGRAPHY N/A 05/21/2019   Procedure: LEFT HEART CATH AND CORONARY ANGIOGRAPHY;  Surgeon: Iran Ouch, MD;  Location: [facility];  Service: Cardiovascular;  Laterality: N/A;   OVARIAN CYST SURGERY     RE-EXCISION OF BREAST LUMPECTOMY Left 04/01/2016   Procedure: RE-EXCISION OF LEFT BREAST MEDIAL MARGIN;  Surgeon: Chevis Pretty III, MD;  Location: [facility];  Service: General;  Laterality: Left;   Social History   Tobacco Use   Smoking status: Former    Current packs/day: 0.00    Average packs/day: 1 pack/day for 10.0 years (10.0 ttl pk-yrs)    Types: Cigarettes    Start date: 03/11/1958    Quit date: 03/11/1968    Years since quitting: 54.6   Smokeless tobacco: Never  Vaping Use   Vaping status: Never Used  Substance Use Topics   Alcohol use: No    Alcohol/week: 0.0 standard drinks of alcohol   Drug use: No   Family History  Problem Relation Age of Onset   Hypertension Mother    Heart disease Father    Macular degeneration Sister    Diabetes Maternal Aunt    Cancer Maternal Aunt        throat   Allergies  Allergen Reactions   Buspirone Hcl     REACTION: HEART PALPITATIONS   Codeine     REACTION: nausea and vomiting    Hydrochlorothiazide     REACTION: syncope-possibly from dehydration   Iron     Oral iron -per pt made her "pass out"    Isosorbide Nitrate Other (See Comments)    hypotension   Lansoprazole     REACTION: abd pain   Minocycline Hcl    Oxycodone Other (See Comments)    Hallucinations    Penicillins     REACTION: mouth numbness Has patient had a PCN reaction causing immediate rash, facial/tongue/throat swelling, SOB or lightheadedness with hypotension: No Has patient had a PCN reaction causing  severe rash involving mucus membranes or skin necrosis: No Has patient had a PCN reaction that required hospitalization: No Has patient had a PCN reaction occurring within the last 10 years: No If all of the above answers are "NO", then may proceed with Cephalosporin use.   Phenergan [Promethazine Hcl] Other (See Comments)    Body shakes, hallucinations   Ramipril     Cough    Ranexa [Ranolazine]     Constipation    Sulfa Antibiotics Nausea Only   Rosuvastatin     Patient reports muscle pain, unable to take. Stopped around 07/22.   Current Outpatient Medications on File Prior to Visit  Medication Sig Dispense Refill   Budeson-Glycopyrrol-Formoterol (BREZTRI AEROSPHERE) 160-9-4.8 MCG/ACT AERO Inhale 2 puffs into the lungs in the morning and at bedtime.     carvedilol (COREG) 3.125 MG tablet TAKE 1 TABLET(3.125 MG) BY MOUTH TWICE DAILY 60 tablet 11   Cholecalciferol (VITAMIN D) 2000 UNITS CAPS Take 1 capsule by mouth daily.     clopidogrel (PLAVIX) 75 MG tablet TAKE 1 TABLET(75 MG) BY MOUTH DAILY WITH BREAKFAST, crush pill if unable to swallow 90 tablet 3   cyanocobalamin (,VITAMIN B-12,) 1000 MCG/ML injection Inject 1,000 mcg into the muscle every 30 (thirty) days.     docusate sodium (COLACE) 100 MG capsule Take 2 capsules (200 mg total) by mouth 2 (two) times daily. 10 capsule 0   famotidine (PEPCID) 40 MG/5ML suspension Take 2.5 mLs (20 mg total) by mouth 2 (two) times daily. In mid morning  and bedtime 50 mL 0   losartan (COZAAR) 100 MG tablet TAKE 1 TABLET(100 MG) BY MOUTH DAILY 90 tablet 3   meclizine (ANTIVERT) 25 MG tablet Take 25 mg by mouth 2 (two) times daily as needed.     Multiple Vitamin (MULTIVITAMIN WITH MINERALS) TABS tablet Take 1 tablet by mouth daily. 30 tablet 1   nitroGLYCERIN (NITROSTAT) 0.4 MG SL tablet PLACE 1 TABLET UNDER TONGUE EVERY 5 MIN AS NEEDED FOR CHEST PAIN IF NO RELIEF IN15 MIN CALL 911 (MAX 3 TABS) 25 tablet 1   omeprazole (PRILOSEC) 20 MG capsule Take 1 capsule (20 mg total) by mouth 2 (two) times daily before a meal. 180 capsule 1   rosuvastatin (CRESTOR) 10 MG tablet TAKE 1 TABLET(10 MG) BY MOUTH DAILY 90 tablet 2   Skin Protectants, Misc. (EUCERIN) cream Apply 1 application. topically as needed for dry skin.     No current facility-administered medications on file prior to visit.    Review of Systems  Constitutional:  Positive for fatigue. Negative for activity change, appetite change, fever and unexpected weight change.  HENT:  Positive for congestion, postnasal drip and rhinorrhea. Negative for ear pain, sinus pressure, sinus pain and sore throat.   Eyes:  Negative for pain, redness and visual disturbance.  Respiratory:  Positive for cough. Negative for chest tightness, shortness of breath and wheezing.        Very mild cough   Cardiovascular:  Negative for chest pain and palpitations.  Gastrointestinal:  Negative for abdominal pain, blood in stool, constipation and diarrhea.  Endocrine: Negative for polydipsia and polyuria.  Genitourinary:  Negative for dysuria, frequency and urgency.  Musculoskeletal:  Negative for arthralgias, back pain and myalgias.  Skin:  Negative for pallor and rash.  Allergic/Immunologic: Negative for environmental allergies.  Neurological:  Negative for dizziness, syncope and headaches.  Hematological:  Negative for adenopathy. Does not bruise/bleed easily.  Psychiatric/Behavioral:  Negative for decreased  concentration and  dysphoric mood. The patient is not nervous/anxious.        Objective:   Physical Exam Constitutional:      General: She is not in acute distress.    Appearance: Normal appearance. She is well-developed and normal weight. She is not ill-appearing, toxic-appearing or diaphoretic.     Comments: Frail appearing Tired but not toxic   HENT:     Head: Normocephalic and atraumatic.     Comments: Nares are injected and congested      Right Ear: External ear normal.     Left Ear: External ear normal.     Ears:     Comments: Hearing aides bilat    Nose: Congestion and rhinorrhea present.     Mouth/Throat:     Mouth: Mucous membranes are moist.     Pharynx: Oropharynx is clear. No oropharyngeal exudate or posterior oropharyngeal erythema.     Comments: Clear pnd  Eyes:     General:        Right eye: No discharge.        Left eye: No discharge.     Conjunctiva/sclera: Conjunctivae normal.     Pupils: Pupils are equal, round, and reactive to light.  Cardiovascular:     Rate and Rhythm: Normal rate.     Heart sounds: Normal heart sounds.  Pulmonary:     Effort: Pulmonary effort is normal. No respiratory distress.     Breath sounds: Normal breath sounds. No stridor. No wheezing, rhonchi or rales.     Comments: Good air exch No wheeze even on forced exp Chest:     Chest wall: No tenderness.  Musculoskeletal:     Cervical back: Normal range of motion and neck supple.  Lymphadenopathy:     Cervical: No cervical adenopathy.  Skin:    General: Skin is warm and dry.     Capillary Refill: Capillary refill takes less than 2 seconds.     Findings: No rash.  Neurological:     Mental Status: She is alert.     Cranial Nerves: No cranial nerve deficit.  Psychiatric:        Mood and Affect: Mood normal.           Assessment & Plan:   Problem List Items Addressed This Visit       Respiratory   Viral URI with cough - Primary    Fairly mild  Nasal symptoms  Covid  negative  Reassuring exam  Reviewed symptoms care= see AVS Update if not starting to improve in a week or if worsening  Call back and Er precautions noted in detail today   Handout given         Relevant Orders   POC COVID-19 BinaxNow (Completed)     Other   B12 deficiency    Not due for B12 shot until after 8/16 Will schedule nurse visit

## 2022-10-30 ENCOUNTER — Ambulatory Visit: Payer: Medicare Other

## 2022-10-30 DIAGNOSIS — E538 Deficiency of other specified B group vitamins: Secondary | ICD-10-CM | POA: Diagnosis not present

## 2022-10-30 MED ORDER — CYANOCOBALAMIN 1000 MCG/ML IJ SOLN
1000.0000 ug | Freq: Once | INTRAMUSCULAR | Status: AC
Start: 2022-10-30 — End: 2022-10-30
  Administered 2022-10-30: 1000 ug via INTRAMUSCULAR

## 2022-10-30 NOTE — Progress Notes (Signed)
Per orders of Dr. Roxy Manns, injection of vitamin b 12 inj given by Lewanda Rife in right deltoid. Patient tolerated injection well. Patient will make appointment for 1 month.

## 2022-11-19 ENCOUNTER — Ambulatory Visit: Payer: Medicare Other | Admitting: Student in an Organized Health Care Education/Training Program

## 2022-11-27 ENCOUNTER — Telehealth: Payer: Self-pay | Admitting: Cardiovascular Disease

## 2022-11-27 ENCOUNTER — Other Ambulatory Visit: Payer: Self-pay | Admitting: Family Medicine

## 2022-11-27 MED ORDER — CLOPIDOGREL BISULFATE 75 MG PO TABS: ORAL_TABLET | ORAL | 0 refills | Status: DC

## 2022-11-27 NOTE — Telephone Encounter (Signed)
Prescription Request  11/27/2022  LOV: 10/18/2022  What is the name of the medication or equipment? clopidogrel (PLAVIX) 75 MG tablet   Have you contacted your pharmacy to request a refill? Yes   Which pharmacy would you like this sent to?  [id number] [place], [place] - [address] AT [facility] OF [address] [place] [address] Phone: [phone] Fax: [phone]    Patient notified that their request is being sent to the clinical staff for review and that they should receive a response within 2 business days.   Please advise at Mobile [phone] (mobile)

## 2022-11-27 NOTE — Telephone Encounter (Signed)
Good Morning Elon Jester,  Please advise if ok to refill medication under Dr. Mariah Milling as the prescribing physician. Medication was last prescribed by Judy Pimple, MD. Thank you so much.

## 2022-11-27 NOTE — Telephone Encounter (Signed)
Please schedule annual follow up of chronic medical problems (annual exam) in the next 3 months  Thanks

## 2022-11-27 NOTE — Telephone Encounter (Signed)
Last filled on 11/23/21 #90 tabs/ 3 refills, pt has had a few recent acute appts but last f/u was on 09/11/22

## 2022-11-27 NOTE — Telephone Encounter (Signed)
*  STAT* If patient is at the pharmacy, call can be transferred to refill team.   1. Which medications need to be refilled? (please list name of each medication and dose if known) clopidogrel (PLAVIX) 75 MG tablet   2. Which pharmacy/location (including street and city if local pharmacy) is medication to be sent to?  [id number] - [place], [place] - [address] AT [address] OF [address]      3. Do they need a 30 day or 90 day supply? 90 day    Pt is out of medication

## 2022-11-27 NOTE — Telephone Encounter (Signed)
Called the patient to inform her that her Plavix refills has previously been prescribed by her PCP and she may have to inquire about a refill with her PCP. Patient verbalized understanding.

## 2022-11-28 DIAGNOSIS — H02055 Trichiasis without entropian left lower eyelid: Secondary | ICD-10-CM | POA: Diagnosis not present

## 2022-11-28 DIAGNOSIS — H524 Presbyopia: Secondary | ICD-10-CM | POA: Diagnosis not present

## 2022-11-28 DIAGNOSIS — H43813 Vitreous degeneration, bilateral: Secondary | ICD-10-CM | POA: Diagnosis not present

## 2022-11-28 DIAGNOSIS — H5213 Myopia, bilateral: Secondary | ICD-10-CM | POA: Diagnosis not present

## 2022-11-28 DIAGNOSIS — H52223 Regular astigmatism, bilateral: Secondary | ICD-10-CM | POA: Diagnosis not present

## 2022-11-28 DIAGNOSIS — H16142 Punctate keratitis, left eye: Secondary | ICD-10-CM | POA: Diagnosis not present

## 2022-11-28 DIAGNOSIS — H35433 Paving stone degeneration of retina, bilateral: Secondary | ICD-10-CM | POA: Diagnosis not present

## 2022-11-28 DIAGNOSIS — Z961 Presence of intraocular lens: Secondary | ICD-10-CM | POA: Diagnosis not present

## 2022-11-28 NOTE — Telephone Encounter (Signed)
Patient has been scheduled

## 2022-12-03 ENCOUNTER — Ambulatory Visit (INDEPENDENT_AMBULATORY_CARE_PROVIDER_SITE_OTHER): Payer: Medicare Other

## 2022-12-03 DIAGNOSIS — E538 Deficiency of other specified B group vitamins: Secondary | ICD-10-CM

## 2022-12-03 MED ORDER — CYANOCOBALAMIN 1000 MCG/ML IJ SOLN
1000.0000 ug | Freq: Once | INTRAMUSCULAR | Status: AC
Start: 2022-12-03 — End: 2022-12-03
  Administered 2022-12-03: 1000 ug via INTRAMUSCULAR

## 2022-12-03 NOTE — Progress Notes (Signed)
Patient presented for Vitamin B12 injection per Dr. Milinda Antis. Injection administered by Jaynee Eagles, CMA to the patient's L Deltoid. Patient voiced no concerns nor showed any signs of distress during or after injection.

## 2023-01-01 ENCOUNTER — Other Ambulatory Visit: Payer: Self-pay | Admitting: Family Medicine

## 2023-01-01 DIAGNOSIS — Z1231 Encounter for screening mammogram for malignant neoplasm of breast: Secondary | ICD-10-CM

## 2023-01-07 ENCOUNTER — Ambulatory Visit (INDEPENDENT_AMBULATORY_CARE_PROVIDER_SITE_OTHER): Payer: Medicare Other

## 2023-01-07 DIAGNOSIS — Z23 Encounter for immunization: Secondary | ICD-10-CM | POA: Diagnosis not present

## 2023-01-07 DIAGNOSIS — E538 Deficiency of other specified B group vitamins: Secondary | ICD-10-CM

## 2023-01-07 MED ORDER — CYANOCOBALAMIN 1000 MCG/ML IJ SOLN
1000.0000 ug | Freq: Once | INTRAMUSCULAR | Status: AC
Start: 2023-01-07 — End: 2023-01-07
  Administered 2023-01-07: 1000 ug via INTRAMUSCULAR

## 2023-01-07 NOTE — Progress Notes (Signed)
Patient presented for B 12 injection given by Wendie Simmer, CMA to right deltoid, patient voiced no concerns nor showed any signs of distress during injection.  Patient also received high dose flu vaccine in left deltoid.

## 2023-01-13 DIAGNOSIS — H02055 Trichiasis without entropian left lower eyelid: Secondary | ICD-10-CM | POA: Diagnosis not present

## 2023-01-17 ENCOUNTER — Ambulatory Visit
Admission: RE | Admit: 2023-01-17 | Discharge: 2023-01-17 | Disposition: A | Discharge status: Home / Self Care | Payer: Medicare Other | Source: Ambulatory Visit | Attending: Family Medicine | Admitting: Family Medicine

## 2023-01-17 DIAGNOSIS — Z1231 Encounter for screening mammogram for malignant neoplasm of breast: Secondary | ICD-10-CM | POA: Diagnosis not present

## 2023-01-17 HISTORY — DX: Malignant neoplasm of unspecified site of unspecified female breast: C50.919

## 2023-01-25 ENCOUNTER — Other Ambulatory Visit: Payer: Self-pay | Admitting: Cardiovascular Disease

## 2023-01-25 ENCOUNTER — Other Ambulatory Visit: Payer: Self-pay | Admitting: Cardiology

## 2023-01-28 NOTE — Telephone Encounter (Signed)
GERD is being treated by PCP

## 2023-02-03 DIAGNOSIS — Z961 Presence of intraocular lens: Secondary | ICD-10-CM | POA: Diagnosis not present

## 2023-02-03 DIAGNOSIS — H16142 Punctate keratitis, left eye: Secondary | ICD-10-CM | POA: Diagnosis not present

## 2023-02-03 DIAGNOSIS — H35433 Paving stone degeneration of retina, bilateral: Secondary | ICD-10-CM | POA: Diagnosis not present

## 2023-02-03 DIAGNOSIS — H02055 Trichiasis without entropian left lower eyelid: Secondary | ICD-10-CM | POA: Diagnosis not present

## 2023-02-03 DIAGNOSIS — T1512XA Foreign body in conjunctival sac, left eye, initial encounter: Secondary | ICD-10-CM | POA: Diagnosis not present

## 2023-02-03 DIAGNOSIS — H43813 Vitreous degeneration, bilateral: Secondary | ICD-10-CM | POA: Diagnosis not present

## 2023-02-17 ENCOUNTER — Telehealth: Payer: Self-pay | Admitting: Family Medicine

## 2023-02-17 DIAGNOSIS — E538 Deficiency of other specified B group vitamins: Secondary | ICD-10-CM

## 2023-02-17 DIAGNOSIS — M810 Age-related osteoporosis without current pathological fracture: Secondary | ICD-10-CM

## 2023-02-17 DIAGNOSIS — R7303 Prediabetes: Secondary | ICD-10-CM

## 2023-02-17 DIAGNOSIS — E782 Mixed hyperlipidemia: Secondary | ICD-10-CM

## 2023-02-17 DIAGNOSIS — I10 Essential (primary) hypertension: Secondary | ICD-10-CM

## 2023-02-17 DIAGNOSIS — D509 Iron deficiency anemia, unspecified: Secondary | ICD-10-CM

## 2023-02-17 DIAGNOSIS — E559 Vitamin D deficiency, unspecified: Secondary | ICD-10-CM

## 2023-02-17 NOTE — Telephone Encounter (Signed)
-----   Message from Vincenza Hews sent at 02/04/2023  2:59 PM EST ----- Regarding: Lab Wed 02/19/23 Hello,  Patient is coming in for CPE labs on Wednesday 02/19/23. Can we get orders please.   Thanks

## 2023-02-19 ENCOUNTER — Other Ambulatory Visit (INDEPENDENT_AMBULATORY_CARE_PROVIDER_SITE_OTHER): Payer: Medicare Other

## 2023-02-19 ENCOUNTER — Ambulatory Visit (INDEPENDENT_AMBULATORY_CARE_PROVIDER_SITE_OTHER): Payer: Medicare Other

## 2023-02-19 DIAGNOSIS — R7303 Prediabetes: Secondary | ICD-10-CM

## 2023-02-19 DIAGNOSIS — E538 Deficiency of other specified B group vitamins: Secondary | ICD-10-CM

## 2023-02-19 DIAGNOSIS — D509 Iron deficiency anemia, unspecified: Secondary | ICD-10-CM | POA: Diagnosis not present

## 2023-02-19 DIAGNOSIS — E559 Vitamin D deficiency, unspecified: Secondary | ICD-10-CM

## 2023-02-19 DIAGNOSIS — E782 Mixed hyperlipidemia: Secondary | ICD-10-CM

## 2023-02-19 DIAGNOSIS — I10 Essential (primary) hypertension: Secondary | ICD-10-CM | POA: Diagnosis not present

## 2023-02-19 LAB — CBC WITH DIFFERENTIAL/PLATELET
Basophils Absolute: 0 10*3/uL (ref 0.0–0.1)
Basophils Relative: 0.6 % (ref 0.0–3.0)
Eosinophils Absolute: 0.1 10*3/uL (ref 0.0–0.7)
Eosinophils Relative: 1.8 % (ref 0.0–5.0)
HCT: 29.8 % — ABNORMAL LOW (ref 36.0–46.0)
Hemoglobin: 9.5 g/dL — ABNORMAL LOW (ref 12.0–15.0)
Lymphocytes Relative: 35.5 % (ref 12.0–46.0)
Lymphs Abs: 2.3 10*3/uL (ref 0.7–4.0)
MCHC: 31.8 g/dL (ref 30.0–36.0)
MCV: 75.4 fL — ABNORMAL LOW (ref 78.0–100.0)
Monocytes Absolute: 0.6 10*3/uL (ref 0.1–1.0)
Monocytes Relative: 8.7 % (ref 3.0–12.0)
Neutro Abs: 3.5 10*3/uL (ref 1.4–7.7)
Neutrophils Relative %: 53.4 % (ref 43.0–77.0)
Platelets: 249 10*3/uL (ref 150.0–400.0)
RBC: 3.95 Mil/uL (ref 3.87–5.11)
RDW: 18.4 % — ABNORMAL HIGH (ref 11.5–15.5)
WBC: 6.5 10*3/uL (ref 4.0–10.5)

## 2023-02-19 LAB — COMPREHENSIVE METABOLIC PANEL
ALT: 7 U/L (ref 0–35)
AST: 15 U/L (ref 0–37)
Albumin: 4.1 g/dL (ref 3.5–5.2)
Alkaline Phosphatase: 75 U/L (ref 39–117)
BUN: 13 mg/dL (ref 6–23)
CO2: 26 meq/L (ref 19–32)
Calcium: 9.3 mg/dL (ref 8.4–10.5)
Chloride: 101 meq/L (ref 96–112)
Creatinine, Ser: 0.95 mg/dL (ref 0.40–1.20)
GFR: 53.27 mL/min — ABNORMAL LOW (ref >60.00)
Glucose, Bld: 96 mg/dL (ref 70–99)
Potassium: 4.4 meq/L (ref 3.5–5.1)
Sodium: 136 meq/L (ref 135–145)
Total Bilirubin: 0.5 mg/dL (ref 0.2–1.2)
Total Protein: 8.1 g/dL (ref 6.0–8.3)

## 2023-02-19 LAB — LIPID PANEL
Cholesterol: 133 mg/dL (ref 0–200)
HDL: 67.4 mg/dL (ref >39.00)
LDL Cholesterol: 51 mg/dL (ref 0–99)
NonHDL: 65.97
Total CHOL/HDL Ratio: 2
Triglycerides: 76 mg/dL (ref 0.0–149.0)
VLDL: 15.2 mg/dL (ref 0.0–40.0)

## 2023-02-19 LAB — VITAMIN B12: Vitamin B-12: >1537 pg/mL — ABNORMAL HIGH (ref 211–911)

## 2023-02-19 LAB — VITAMIN D 25 HYDROXY (VIT D DEFICIENCY, FRACTURES): VITD: 56.82 ng/mL (ref 30.00–100.00)

## 2023-02-19 LAB — TSH: TSH: 1.94 u[IU]/mL (ref 0.35–5.50)

## 2023-02-19 LAB — HEMOGLOBIN A1C: Hgb A1c MFr Bld: 6.4 % (ref 4.6–6.5)

## 2023-02-19 MED ORDER — CYANOCOBALAMIN 1000 MCG/ML IJ SOLN
1000.0000 ug | Freq: Once | INTRAMUSCULAR | Status: AC
  Administered 2023-02-19: 1000 ug via INTRAMUSCULAR

## 2023-02-19 NOTE — Progress Notes (Signed)
Per orders of Dr. Roxy Manns, injection of b12 given by Lonia Blood in left deltoid. Patient tolerated injection well. Patient will make appointment for 1 month.

## 2023-02-23 NOTE — Progress Notes (Unsigned)
Cardiology Office Note  Date:  02/24/2023   ID:  Jenna Young, DOB Aug 10, 1931, MRN [id number]  PCP:  Judy Pimple, MD   Chief Complaint  Patient presents with   6 month follow up     "Doing well." Medications reviewed by the patient verbally.     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 Mild pulm fibrosis on ct chest emphysema who follows up today for her hypertension, near syncope/syncope, stable angina  LOV 7/24 In follow-up today reports she feels well overall Rare chest pain episodes, better with taking a peppermint candy Previously took Mylanta for chest pain which relieved her symptoms Mylanta would work better than sublingual nitro Cold brings it on Rarely takes nitro, thinks they are old  Will check her blood pressure at home typically runs 130 systolic Well-controlled on last clinic visit, elevated today which she attributes to rushing in  Tolerating Crestor 10 Lipids at goal  EKG personally reviewed by myself on todays visit EKG Interpretation Date/Time:  Monday February 24 2023 14:01:17 EST Ventricular Rate:  73 PR Interval:  136 QRS Duration:  144 QT Interval:  428 QTC Calculation: 471 R Axis:   -29  Text Interpretation: Sinus rhythm with occasional Premature ventricular complexes Left bundle branch block When compared with ECG of 10-Sep-2022 15:07, No significant change was found Confirmed by Julien Nordmann [phone]) on 02/24/2023 2:06:15 PM   Several episodes of near syncope/syncope in 2021  NSTEMI 05/2019 cardiac catheterization showing occluded RCA with collaterals from left to right  Long hx of anemia, HGB 11 Iron infusions x2 Follows up with heme  Syncope 2020  on Saturday, was in the garden, 11 AM Reports that she  was carrying some pot plants outside, started to feel dizzy Made her way inside but was dizzy, finally sat down inside Sister presents with her today reports that she had syncope per the son who was there with her Had bowel  incontinence Did not hurt herself  06/13/2019, reports that she had episode of syncope Ate some toast, then went shopping Maybe had some tea when she was out on the road Came back home, family was there, immediately started cooking Was in the kitchen when she felt weird, saw black spots, had to sit down Became unresponsive on the couch Family later on the floor Could not find pulse Blood pressure 61/40s, pulse ? EMTs arrived, she came too  Chronic arm weakness started in March 2020 Stop the statin on her own symptoms did not get better Arm discomfort when moving the garbage can, raising her arms for long periods.  They feel tired  Prior episode of syncope while at the beauty parlor Had hair done, got up to a when she felt lightheaded and passed out  She did not fall or sustain any injuries from this event.   When EMS arrived patient's blood glucose was 137 and her blood pressure was 74/43 for which she received 500 cc of fluid.  In the hospital, TNT 2.48  started on heparin, medical management No cardiac catheterization performed as numbers were trending down and she was asymptomatic Etiology of the events unclear whether this was vasovagal, arrhythmia,  Lab work did not appear to be indicative of dehydration  hospital Admission for NSTEMI , 02/10/2017 D/c 02/13/2017  Cath Significant mid RCA disease with sequential, calcified 80 and 95% stenoses. Element of thrombus is likely also present. 90% ostial stenosis involving small OM1 branch. Mildly reduced left ventricular contraction with inferior hypokinesis (LVEF ~45%). Mildly elevated left ventricular filling pressure. Unsuccessful PCI to mid RCA due to inability to cross the mid RCA stenosis despite aggressive  buddy wire support.  Recommendation : if she has unstable angina symptoms could consider PCI with atherectomy to mid RCA   Echo 02/2017 Systolic function was normal. The   estimated ejection fraction was in the range of 50% to 55%.   Hypokinesis of the inferior myocardium.  diagnosis of DCIS, had a biopsy bx Completed XRT   previously on amlodipine but wanted to change secondary to leg edema Started on bystolic Blood pressure continue to run high Started on HCTZ every other day  could not afford Bystolic long term   was changed to clonidine.  Presented to the emergency room December 31 2015 with near syncope , dehydration  "Fell asleep in the sun " while she was on a swing set, was difficult to arouse, was taken inside by family, had large bowel movement   in the emergency room heart rate 62 bpm , sodium down to 129, creatinine elevated Notes indicate she was only taking clonidine once a day   PMH:   has a past medical history of Allergic rhinitis, Anemia, Anxiety, Breast cancer (HCC), CAD (coronary artery disease), Chronic HFrEF (heart failure with mid-range ejection fraction) (HCC), GERD (gastroesophageal reflux disease), History of radiation therapy (04/29/16- 05/24/16), Hyperlipidemia, Hypertension, Interstitial lung disease (HCC), Ischemic cardiomyopathy, Osteopenia, Osteoporosis, Personal history of radiation therapy (2018), and Syncope.  PSH:    Past Surgical History:  Procedure Laterality Date   APPENDECTOMY     BREAST BIOPSY Left 11/29/2008   BREAST BIOPSY Left 01/26/2016   malignant   BREAST EXCISIONAL BIOPSY Left 12/26/2008   BREAST LUMPECTOMY Left 02/27/2016   BREAST LUMPECTOMY Left 04/01/2016   BREAST LUMPECTOMY WITH RADIOACTIVE SEED LOCALIZATION Left 02/27/2016   Procedure: LEFT BREAST LUMPECTOMY WITH RADIOACTIVE SEED LOCALIZATION;  Surgeon: Chevis Pretty III, MD;  Location: [facility];  Service: General;  Laterality: Left;   BREAST SURGERY  10/10   benign  biopsy  negative   CESAREAN SECTION     CHOLECYSTECTOMY     CORONARY STENT INTERVENTION N/A 02/11/2017   Procedure: CORONARY STENT INTERVENTION;  Surgeon: Yvonne Kendall, MD;  Location: [facility];  Service: Cardiovascular;  Laterality: N/A;   FEMUR FRACTURE SURGERY Right 2006   HIP ARTHROPLASTY Left 06/28/2021   Procedure: ARTHROPLASTY BIPOLAR HIP (HEMIARTHROPLASTY);  Surgeon: Ross Marcus, MD;  Location: [facility];  Service: Orthopedics;  Laterality: Left;   LEFT HEART CATH AND CORONARY ANGIOGRAPHY N/A 02/11/2017   Procedure: LEFT HEART CATH AND CORONARY ANGIOGRAPHY;  Surgeon: Yvonne Kendall, MD;  Location: [facility];  Service: Cardiovascular;  Laterality: N/A;   LEFT HEART CATH AND CORONARY ANGIOGRAPHY N/A 05/21/2019   Procedure: LEFT HEART CATH AND CORONARY ANGIOGRAPHY;  Surgeon: Iran Ouch, MD;  Location: [facility];  Service: Cardiovascular;  Laterality: N/A;   OVARIAN CYST SURGERY     RE-EXCISION OF BREAST LUMPECTOMY Left 04/01/2016   Procedure: RE-EXCISION OF LEFT BREAST MEDIAL MARGIN;  Surgeon: Chevis Pretty III, MD;  Location: [facility];  Service: General;  Laterality: Left;    Current Outpatient Medications  Medication Sig Dispense Refill   Budeson-Glycopyrrol-Formoterol (BREZTRI AEROSPHERE) 160-9-4.8 MCG/ACT AERO Inhale 2  puffs into the lungs in the morning and at bedtime.     carvedilol (COREG) 3.125 MG tablet TAKE 1 TABLET(3.125 MG) BY MOUTH TWICE DAILY 60 tablet 11   Cholecalciferol (VITAMIN D) 2000 UNITS CAPS Take 1 capsule by mouth daily.     clopidogrel (PLAVIX) 75 MG tablet TAKE 1 TABLET(75 MG) BY MOUTH DAILY WITH BREAKFAST, crush pill if unable to swallow 90 tablet 1   cyanocobalamin (,VITAMIN B-12,) 1000 MCG/ML injection Inject 1,000 mcg into the muscle every 30 (thirty) days.     docusate sodium (COLACE) 100 MG capsule Take 2 capsules (200 mg total) by mouth 2 (two) times daily. 10 capsule 0   famotidine (PEPCID) 40  MG/5ML suspension Take 2.5 mLs (20 mg total) by mouth 2 (two) times daily. In mid morning and bedtime 50 mL 0   losartan (COZAAR) 100 MG tablet TAKE 1 TABLET(100 MG) BY MOUTH DAILY 90 tablet 3   meclizine (ANTIVERT) 25 MG tablet Take 25 mg by mouth 2 (two) times daily as needed.     Multiple Vitamin (MULTIVITAMIN WITH MINERALS) TABS tablet Take 1 tablet by mouth daily. 30 tablet 1   nitroGLYCERIN (NITROSTAT) 0.4 MG SL tablet PLACE 1 TABLET UNDER TONGUE EVERY 5 MIN AS NEEDED FOR CHEST PAIN IF NO RELIEF IN15 MIN CALL 911 (MAX 3 TABS) 25 tablet 1   omeprazole (PRILOSEC) 20 MG capsule Take 1 capsule (20 mg total) by mouth 2 (two) times daily before a meal. 180 capsule 1   rosuvastatin (CRESTOR) 10 MG tablet TAKE 1 TABLET(10 MG) BY MOUTH DAILY 90 tablet 3   Skin Protectants, Misc. (EUCERIN) cream Apply 1 application. topically as needed for dry skin.     No current facility-administered medications for this visit.     Allergies:   Buspirone hcl, Codeine, Hydrochlorothiazide, Iron, Isosorbide nitrate, Lansoprazole, Minocycline hcl, Oxycodone, Penicillins, Phenergan [promethazine hcl], Ramipril, Ranexa [ranolazine], Sulfa antibiotics, and Rosuvastatin   Social History:  The patient  reports that she quit smoking about 54 years ago. Her smoking use included cigarettes. She started smoking about 65 years ago. She has a 10 pack-year smoking history. She has never used smokeless tobacco. She reports that she does not drink alcohol and does not use drugs.   Family History:   family history includes Cancer in her maternal aunt; Diabetes in her maternal aunt; Heart disease in her father; Hypertension in her mother; Macular degeneration in her sister.   Review of Systems  HENT: Negative.    Eyes: Negative.   Respiratory: Negative.    Cardiovascular:  Positive for chest pain.  Gastrointestinal: Negative.   Genitourinary: Negative.   Musculoskeletal: Negative.   Neurological: Negative.    Psychiatric/Behavioral: Negative.    All other systems reviewed and are negative.   PHYSICAL EXAM: VS:  BP (!) 152/60 (BP Location: Left Arm, Patient Position: Sitting, Cuff Size: Normal)   Pulse 73   Ht 5\' 1"  (1.549 m)   Wt 127 lb 4 oz (57.7 kg)   BMI 24.04 kg/m  , BMI Body mass index is 24.04 kg/m. Constitutional:  oriented to person, place, and time. No distress.  HENT:  Head: Grossly normal Eyes:  no discharge. No scleral icterus.  Neck: No JVD, no carotid bruits  Cardiovascular: Regular rate and rhythm, no murmurs appreciated Pulmonary/Chest: Clear to auscultation bilaterally, no wheezes or rails Abdominal: Soft.  no distension.  no tenderness.  Musculoskeletal: Normal range of motion Neurological:  normal muscle tone. Coordination normal. No atrophy Skin: Skin warm  and dry Psychiatric: normal affect, pleasant  Recent Labs: 02/19/2023: ALT 7; BUN 13; Creatinine, Ser 0.95; Hemoglobin 9.5; Platelets 249.0; Potassium 4.4; Sodium 136; TSH 1.94    Lipid Panel Lab Results  Component Value Date   CHOL 133 02/19/2023   HDL 67.40 02/19/2023   LDLCALC 51 02/19/2023   TRIG 76.0 02/19/2023    Wt Readings from Last 3 Encounters:  02/24/23 127 lb 4 oz (57.7 kg)  10/18/22 124 lb 6 oz (56.4 kg)  09/11/22 124 lb 3.2 oz (56.3 kg)     ASSESSMENT AND PLAN:  Coronary artery disease with stable angina Prior catheterization with chronically occluded RCA collaterals from left to right Chronic stable angina symptoms Previously declined stress testing or catheterization She is happy to manage her rare episodes of chest pain with peppermints, Mylanta Rarely takes nitro  GERD Reports symptoms relatively well-controlled on Mylanta, omeprazole 20 twice daily  Syncope No further epsiodes We will avoid aggressive blood pressure management  Essential hypertension, benign -  Blood pressure at home typically 120 up to 130 Numbers elevated today rushing into the office  Pure  hypercholesterolemia Continue crestor 10, numbers at goal Myalgias on Lipitor  Ductal carcinoma in situ (DCIS) of left breast Treatment is complete, radiation to the left     Orders Placed This Encounter  Procedures   EKG 12-Lead    Signed, Dossie Arbour, M.D., Ph.D. 02/24/2023  [facility] [place], [place] [phone]

## 2023-02-24 ENCOUNTER — Encounter: Payer: Self-pay | Admitting: Cardiovascular Disease

## 2023-02-24 ENCOUNTER — Other Ambulatory Visit: Payer: Self-pay | Admitting: Family Medicine

## 2023-02-24 ENCOUNTER — Ambulatory Visit: Payer: Medicare Other | Attending: Cardiovascular Disease | Admitting: Cardiovascular Disease

## 2023-02-24 VITALS — BP 152/60 | HR 73 | Ht 61.0 in | Wt 127.2 lb

## 2023-02-24 DIAGNOSIS — D649 Anemia, unspecified: Secondary | ICD-10-CM | POA: Insufficient documentation

## 2023-02-24 DIAGNOSIS — I25118 Atherosclerotic heart disease of native coronary artery with other forms of angina pectoris: Secondary | ICD-10-CM | POA: Insufficient documentation

## 2023-02-24 DIAGNOSIS — Z87898 Personal history of other specified conditions: Secondary | ICD-10-CM | POA: Diagnosis not present

## 2023-02-24 DIAGNOSIS — I5022 Chronic systolic (congestive) heart failure: Secondary | ICD-10-CM | POA: Insufficient documentation

## 2023-02-24 DIAGNOSIS — T466X5D Adverse effect of antihyperlipidemic and antiarteriosclerotic drugs, subsequent encounter: Secondary | ICD-10-CM | POA: Diagnosis not present

## 2023-02-24 DIAGNOSIS — M791 Myalgia, unspecified site: Secondary | ICD-10-CM | POA: Diagnosis not present

## 2023-02-24 DIAGNOSIS — E782 Mixed hyperlipidemia: Secondary | ICD-10-CM | POA: Diagnosis not present

## 2023-02-24 DIAGNOSIS — J849 Interstitial pulmonary disease, unspecified: Secondary | ICD-10-CM | POA: Insufficient documentation

## 2023-02-24 DIAGNOSIS — I10 Essential (primary) hypertension: Secondary | ICD-10-CM | POA: Insufficient documentation

## 2023-02-24 DIAGNOSIS — T466X5A Adverse effect of antihyperlipidemic and antiarteriosclerotic drugs, initial encounter: Secondary | ICD-10-CM | POA: Diagnosis not present

## 2023-02-24 MED ORDER — CLOPIDOGREL BISULFATE 75 MG PO TABS: ORAL_TABLET | ORAL | 1 refills | Status: DC

## 2023-02-24 MED ORDER — NITROGLYCERIN 0.4 MG SL SUBL: SUBLINGUAL_TABLET | SUBLINGUAL | 1 refills | Status: DC

## 2023-02-24 MED ORDER — CARVEDILOL 3.125 MG PO TABS: ORAL_TABLET | ORAL | 3 refills | Status: DC

## 2023-02-24 NOTE — Patient Instructions (Signed)

## 2023-02-24 NOTE — Telephone Encounter (Signed)
Last filled 11/27/22 #90 tabs/ 0 refill  CPE is 02/26/23

## 2023-02-24 NOTE — Telephone Encounter (Signed)
Prescription Request  02/24/2023  LOV: 10/18/2022  What is the name of the medication or equipment? clopidogrel (PLAVIX) 75 MG tablet   Have you contacted your pharmacy to request a refill? No   Which pharmacy would you like this sent to?  [id number] [place], [place] - [address] AT [facility] OF [address] [place] [address] Phone: [phone] Fax: [phone]    Patient notified that their request is being sent to the clinical staff for review and that they should receive a response within 2 business days.   Please advise at Mobile [phone] (mobile)

## 2023-02-26 ENCOUNTER — Encounter: Payer: Self-pay | Admitting: Family Medicine

## 2023-02-26 ENCOUNTER — Ambulatory Visit (INDEPENDENT_AMBULATORY_CARE_PROVIDER_SITE_OTHER): Payer: Medicare Other | Admitting: Family Medicine

## 2023-02-26 VITALS — BP 132/60 | HR 83 | Temp 98.0°F | Ht 60.0 in | Wt 125.4 lb

## 2023-02-26 DIAGNOSIS — K219 Gastro-esophageal reflux disease without esophagitis: Secondary | ICD-10-CM

## 2023-02-26 DIAGNOSIS — D509 Iron deficiency anemia, unspecified: Secondary | ICD-10-CM

## 2023-02-26 DIAGNOSIS — E559 Vitamin D deficiency, unspecified: Secondary | ICD-10-CM

## 2023-02-26 DIAGNOSIS — R7303 Prediabetes: Secondary | ICD-10-CM

## 2023-02-26 DIAGNOSIS — M810 Age-related osteoporosis without current pathological fracture: Secondary | ICD-10-CM

## 2023-02-26 DIAGNOSIS — I10 Essential (primary) hypertension: Secondary | ICD-10-CM | POA: Diagnosis not present

## 2023-02-26 DIAGNOSIS — E782 Mixed hyperlipidemia: Secondary | ICD-10-CM

## 2023-02-26 DIAGNOSIS — E538 Deficiency of other specified B group vitamins: Secondary | ICD-10-CM | POA: Diagnosis not present

## 2023-02-26 MED ORDER — CYANOCOBALAMIN 1000 MCG/ML IJ SOLN: 1000.0000 ug | INTRAMUSCULAR | Status: AC

## 2023-02-26 NOTE — Assessment & Plan Note (Signed)
Pt declines dexa or treatment currently  Past hip and vert fractures  Taking vitamin D and level is in the normal range  No new falls  Discussed fall prevention, supplements and exercise for bone density

## 2023-02-26 NOTE — Assessment & Plan Note (Signed)
This is worse  Lab Results  Component Value Date   WBC 6.5 02/19/2023   HGB 9.5 (L) 02/19/2023   HCT 29.8 (L) 02/19/2023   MCV 75.4 (L) 02/19/2023   PLT 249.0 02/19/2023   Interested in iron infusion if she is a candidate Saw Dr Orlie Dakin in the past  Does not tolerate oral iron  Referral done Some fatigue

## 2023-02-26 NOTE — Assessment & Plan Note (Signed)
Lab Results  Component Value Date   HGBA1C 6.4 02/19/2023   disc imp of low glycemic diet and wt loss to prevent DM2

## 2023-02-26 NOTE — Assessment & Plan Note (Signed)
bp in fair control at this time  BP Readings from Last 1 Encounters:  02/26/23 132/60   No changes needed Most recent labs reviewed  Disc lifstyle change with low sodium diet and exercise  Off imdur due to hypotension and syncope (no problems since stopping it)  Plan to continue Losartan 100 mg daily  Cored 3.125 mg bid

## 2023-02-26 NOTE — Assessment & Plan Note (Signed)
Continues omeprazole 20 mg bid Unable to get off of it  Encouraged to avoid triggering foods   Watching B12 level

## 2023-02-26 NOTE — Assessment & Plan Note (Signed)
Lab Results  Component Value Date   VITAMINB12 >1537 (H) 02/19/2023   This is high  Will decrease frequency of B12 shots to every other month  No clinical changes

## 2023-02-26 NOTE — Assessment & Plan Note (Signed)
Disc goals for lipids and reasons to control them Rev last labs with pt Rev low sat fat diet in detail Well controlled with LDL of 51  In setting of CAD Taking crestor 10 mg daily

## 2023-02-26 NOTE — Progress Notes (Signed)
Subjective:    Patient ID: Jenna Young, female    DOB: 1931/11/25, 89 y.o.   MRN: [id number]  HPI Here for annual follow up of chronic medical problems    Wt Readings from Last 3 Encounters:  02/26/23 125 lb 6 oz (56.9 kg)  02/24/23 127 lb 4 oz (57.7 kg)  10/18/22 124 lb 6 oz (56.4 kg)   24.49 kg/m  Vitals:   02/26/23 1429 02/26/23 1502  BP: (!) 132/58 132/60  Pulse: 83   Temp: 98 F (36.7 C)   SpO2: 95%     Immunization History  Administered Date(s) Administered   Fluad Quad(high Dose 65+) 12/29/2018, 12/11/2019, 01/12/2021, 11/19/2021   Fluad Trivalent(High Dose 65+) 01/07/2023   Influenza Split 12/11/2010, 01/17/2012   Influenza Whole 01/09/2006, 12/10/2006, 12/08/2007, 12/13/2009   Influenza,inj,Quad PF,6+ Mos 12/09/2012, 12/15/2013, 12/22/2014, 12/21/2015, 12/24/2016, 01/14/2018   PFIZER(Purple Top)SARS-COV-2 Vaccination 04/05/2019, 04/26/2019, 01/12/2020   Pneumococcal Conjugate-13 02/23/2014   Pneumococcal Polysaccharide-23 12/11/2010   Td 01/10/2008    There are no preventive care reminders to display for this patient.  Feels ok  Nothing new  Has family coming in for holidays    Shingrix -declines   Tetanus shot - not utd   Mammogram  01/2023  Personal history of breast cancer  Self breast exam- no lumps   Gyn health-no changes    Colon cancer screening -out aged  Had colonsocopy 2014   Bone health  History of osteoporosis   declines treatment  Dexa declines  Falls- none since hip fracture  Fractures  past hip and spinal  Supplements -vit D  Last vitamin D Lab Results  Component Value Date   VD25OH 56.82 02/19/2023    Exercise  Has a stationary bike but needs to use it more    Mood    02/26/2023    2:33 PM 09/11/2022   11:30 AM 05/23/2022    1:23 PM 04/16/2022    3:58 PM 05/10/2021    9:04 AM  Depression screen PHQ 2/9  Decreased Interest 0 0 0 0 0  Down, Depressed, Hopeless 0 0 0 0 0  PHQ - 2 Score 0 0 0 0 0  Altered  sleeping 0 0 0 0   Tired, decreased energy 0 1 0 0   Change in appetite 0 0 0 0   Feeling bad or failure about yourself  0 0 0 0   Trouble concentrating 0 0 0 0   Moving slowly or fidgety/restless 0 0 0 0   Suicidal thoughts 0 0 0 0   PHQ-9 Score 0 1 0 0   Difficult doing work/chores Not difficult at all Not difficult at all Not difficult at all Not difficult at all    HTN with history of CAD  bp is stable today  No cp or palpitations or headaches or edema  No side effects to medicines  BP Readings from Last 3 Encounters:  02/26/23 132/60  02/24/23 (!) 152/60  10/18/22 125/65    Losartan 100 mg daily  Coreg 3.125 mg bid   Pulse Readings from Last 3 Encounters:  02/26/23 83  02/24/23 73  10/18/22 78     Lab Results  Component Value Date   NA 136 02/19/2023   K 4.4 02/19/2023   CO2 26 02/19/2023   GLUCOSE 96 02/19/2023   BUN 13 02/19/2023   CREATININE 0.95 02/19/2023   CALCIUM 9.3 02/19/2023   GFR 53.27 (L) 02/19/2023   GFRNONAA 56 (L) 08/21/2022  GERD Takes omeprazole 20 mg bid   B12 def  Lab Results  Component Value Date   VITAMINB12 >1537 (H) 02/19/2023   Gets a shot every 30 days    Iron dev anemia  Lab Results  Component Value Date   WBC 6.5 02/19/2023   HGB 9.5 (L) 02/19/2023   HCT 29.8 (L) 02/19/2023   MCV 75.4 (L) 02/19/2023   PLT 249.0 02/19/2023   Watched by hematology in past and had iron inf  Down from 10.0 hb   Has felt a little tired  Cannot tolerate oral iron    Lab Results  Component Value Date   IRON 73 05/23/2020   TIBC 368 05/23/2020   FERRITIN 60 05/23/2020     Hyperlipidemia Lab Results  Component Value Date   CHOL 133 02/19/2023   CHOL 147 05/11/2021   CHOL 184 05/03/2020   Lab Results  Component Value Date   HDL 67.40 02/19/2023   HDL 62.80 05/11/2021   HDL 59.60 05/03/2020   Lab Results  Component Value Date   LDLCALC 51 02/19/2023   LDLCALC 65 05/11/2021   LDLCALC 95 05/03/2020   Lab Results   Component Value Date   TRIG 76.0 02/19/2023   TRIG 98.0 05/11/2021   TRIG 147.0 05/03/2020   Lab Results  Component Value Date   CHOLHDL 2 02/19/2023   CHOLHDL 2 05/11/2021   CHOLHDL 3 05/03/2020   Lab Results  Component Value Date   LDLDIRECT 116.6 01/27/2013   LDLDIRECT 114.5 12/04/2010   LDLDIRECT 117.1 04/23/2010  Crestor 10 mg daily   Prediabetes Lab Results  Component Value Date   HGBA1C 6.4 02/19/2023   Not a lot of sweets  Occational pc of candy     Patient Active Problem List   Diagnosis Date Noted   Interstitial lung disease (HCC) 08/21/2022   Abnormal CXR 04/19/2022   History of hip fracture 11/19/2021   Poor balance 05/18/2021   Great toe pain, right 05/18/2021   Urinary frequency 03/15/2021   Intertrigo labialis 03/15/2021   Frequent urination 08/24/2020   History of COVID-19 08/03/2020   Dermatitis 05/03/2020   Secondary hypertension    Iron deficiency anemia 05/12/2019   Prediabetes 04/15/2018   Encounter for screening mammogram for breast cancer 04/01/2017   H/O syncope 02/28/2017   Coronary artery disease of native artery of native heart with stable angina pectoris (HCC) 02/21/2017   History of non-ST elevation myocardial infarction (NSTEMI) 02/10/2017   History of breast cancer 02/26/2016   Varicose vein of leg 10/09/2015   Fatigue 10/09/2015   Pedal edema 10/09/2015   Heart murmur 10/09/2015   History of spinal fracture 05/07/2015   Dysthymia 09/20/2013   Encounter for Medicare annual wellness exam 02/03/2013   Vitamin D deficiency 08/19/2008   Anxious mood 01/29/2008   Essential hypertension, benign 09/25/2007   B12 deficiency 09/24/2006   Hyperlipidemia 09/24/2006   Venous (peripheral) insufficiency 09/24/2006   Allergic rhinitis 09/24/2006   GERD 09/24/2006   Osteoporosis 09/24/2006   Past Medical History:  Diagnosis Date   Allergic rhinitis    Anemia    Anxiety    Breast cancer (HCC)    Left   CAD (coronary artery  disease)    a. 02/2017 Cath: OM1 90, RCA 30p, 45m, 95d (unsuccessful PCI->Med Rx); b. 05/2019 NSTEMI/Cath: LM nl, LAD mild diff dzs, LCX mild diff dzs, OM1 100 - fills via collats from OM2, RCA 30p, 71m, 132m, RPDA fills via collats from dLAD.  Chronic HFrEF (heart failure with mid-range ejection fraction) (HCC)    a. 05/2019 Echo: EF 40-45%, mild LVH, GrI DD, nl RV fxn, mildly dil LA, triv MR/AI.   GERD (gastroesophageal reflux disease)    History of radiation therapy 04/29/16- 05/24/16   Left Breast 40.05 Gy in 15 fractions, Left Breast boost 10 Gy in 5 fractions.    Hyperlipidemia    Hypertension    treated by Dr Eulis Canner recently with syncope episode   Interstitial lung disease (HCC)    Ischemic cardiomyopathy    Osteopenia    Osteoporosis    Personal history of radiation therapy 2018   Syncope    Past Surgical History:  Procedure Laterality Date   APPENDECTOMY     BREAST BIOPSY Left 11/29/2008   BREAST BIOPSY Left 01/26/2016   malignant   BREAST EXCISIONAL BIOPSY Left 12/26/2008   BREAST LUMPECTOMY Left 02/27/2016   BREAST LUMPECTOMY Left 04/01/2016   BREAST LUMPECTOMY WITH RADIOACTIVE SEED LOCALIZATION Left 02/27/2016   Procedure: LEFT BREAST LUMPECTOMY WITH RADIOACTIVE SEED LOCALIZATION;  Surgeon: Chevis Pretty III, MD;  Location: [facility];  Service: General;  Laterality: Left;   BREAST SURGERY  10/10   benign biopsy  negative   CESAREAN SECTION     CHOLECYSTECTOMY     CORONARY STENT INTERVENTION N/A 02/11/2017   Procedure: CORONARY STENT INTERVENTION;  Surgeon: Yvonne Kendall, MD;  Location: [facility];  Service: Cardiovascular;  Laterality: N/A;   FEMUR FRACTURE SURGERY Right 2006   HIP ARTHROPLASTY Left 06/28/2021   Procedure: ARTHROPLASTY BIPOLAR HIP (HEMIARTHROPLASTY);  Surgeon: Ross Marcus, MD;  Location: [facility];  Service: Orthopedics;  Laterality: Left;   LEFT HEART CATH AND CORONARY ANGIOGRAPHY N/A 02/11/2017   Procedure: LEFT HEART  CATH AND CORONARY ANGIOGRAPHY;  Surgeon: Yvonne Kendall, MD;  Location: [facility];  Service: Cardiovascular;  Laterality: N/A;   LEFT HEART CATH AND CORONARY ANGIOGRAPHY N/A 05/21/2019   Procedure: LEFT HEART CATH AND CORONARY ANGIOGRAPHY;  Surgeon: Iran Ouch, MD;  Location: [facility];  Service: Cardiovascular;  Laterality: N/A;   OVARIAN CYST SURGERY     RE-EXCISION OF BREAST LUMPECTOMY Left 04/01/2016   Procedure: RE-EXCISION OF LEFT BREAST MEDIAL MARGIN;  Surgeon: Chevis Pretty III, MD;  Location:  SURGERY CENTER;  Service: General;  Laterality: Left;   Social History   Tobacco Use   Smoking status: Former    Current packs/day: 0.00    Average packs/day: 1 pack/day for 10.0 years (10.0 ttl pk-yrs)    Types: Cigarettes    Start date: 03/11/1958    Quit date: 03/11/1968    Years since quitting: 55.0   Smokeless tobacco: Never  Vaping Use   Vaping status: Never Used  Substance Use Topics   Alcohol use: No    Alcohol/week: 0.0 standard drinks of alcohol   Drug use: No   Family History  Problem Relation Age of Onset   Hypertension Mother    Heart disease Father    Macular degeneration Sister    Diabetes Maternal Aunt    Cancer Maternal Aunt        throat   Allergies  Allergen Reactions   Buspirone Hcl     REACTION: HEART PALPITATIONS   Codeine     REACTION: nausea and vomiting   Hydrochlorothiazide     REACTION: syncope-possibly from dehydration   Iron     Oral iron -per pt made her "pass out"    Isosorbide Nitrate  Other (See Comments)    hypotension   Lansoprazole     REACTION: abd pain   Minocycline Hcl    Oxycodone Other (See Comments)    Hallucinations    Penicillins     REACTION: mouth numbness Has patient had a PCN reaction causing immediate rash, facial/tongue/throat swelling, SOB or lightheadedness with hypotension: No Has patient had a PCN reaction causing severe rash involving mucus membranes or skin necrosis: No Has  patient had a PCN reaction that required hospitalization: No Has patient had a PCN reaction occurring within the last 10 years: No If all of the above answers are "NO", then may proceed with Cephalosporin use.   Phenergan [Promethazine Hcl] Other (See Comments)    Body shakes, hallucinations   Ramipril     Cough    Ranexa [Ranolazine]     Constipation    Sulfa Antibiotics Nausea Only   Rosuvastatin     Patient reports muscle pain, unable to take. Stopped around 07/22.   Current Outpatient Medications on File Prior to Visit  Medication Sig Dispense Refill   Budeson-Glycopyrrol-Formoterol (BREZTRI AEROSPHERE) 160-9-4.8 MCG/ACT AERO Inhale 2 puffs into the lungs in the morning and at bedtime.     carvedilol (COREG) 3.125 MG tablet TAKE 1 TABLET(3.125 MG) BY MOUTH TWICE DAILY 180 tablet 3   Cholecalciferol (VITAMIN D) 2000 UNITS CAPS Take 1 capsule by mouth daily.     clopidogrel (PLAVIX) 75 MG tablet TAKE 1 TABLET(75 MG) BY MOUTH DAILY WITH BREAKFAST, crush pill if unable to swallow 90 tablet 1   docusate sodium (COLACE) 100 MG capsule Take 2 capsules (200 mg total) by mouth 2 (two) times daily. 10 capsule 0   famotidine (PEPCID) 40 MG/5ML suspension Take 2.5 mLs (20 mg total) by mouth 2 (two) times daily. In mid morning and bedtime 50 mL 0   losartan (COZAAR) 100 MG tablet TAKE 1 TABLET(100 MG) BY MOUTH DAILY 90 tablet 3   meclizine (ANTIVERT) 25 MG tablet Take 25 mg by mouth 2 (two) times daily as needed.     Multiple Vitamin (MULTIVITAMIN WITH MINERALS) TABS tablet Take 1 tablet by mouth daily. 30 tablet 1   nitroGLYCERIN (NITROSTAT) 0.4 MG SL tablet PLACE 1 TABLET UNDER TONGUE EVERY 5 MIN AS NEEDED FOR CHEST PAIN IF NO RELIEF IN15 MIN CALL 911 (MAX 3 TABS) 25 tablet 1   omeprazole (PRILOSEC) 20 MG capsule Take 1 capsule (20 mg total) by mouth 2 (two) times daily before a meal. 180 capsule 1   rosuvastatin (CRESTOR) 10 MG tablet TAKE 1 TABLET(10 MG) BY MOUTH DAILY 90 tablet 3   Skin  Protectants, Misc. (EUCERIN) cream Apply 1 application. topically as needed for dry skin.     No current facility-administered medications on file prior to visit.    Review of Systems  Constitutional:  Negative for activity change, appetite change, fatigue, fever and unexpected weight change.  HENT:  Negative for congestion, ear pain, rhinorrhea, sinus pressure and sore throat.   Eyes:  Negative for pain, redness and visual disturbance.  Respiratory:  Negative for cough, shortness of breath and wheezing.   Cardiovascular:  Negative for chest pain and palpitations.  Gastrointestinal:  Negative for abdominal pain, blood in stool, constipation and diarrhea.  Endocrine: Negative for polydipsia and polyuria.  Genitourinary:  Negative for dysuria, frequency and urgency.  Musculoskeletal:  Positive for arthralgias. Negative for back pain and myalgias.  Skin:  Negative for pallor and rash.  Allergic/Immunologic: Negative for environmental allergies.  Neurological:  Negative for dizziness, syncope and headaches.  Hematological:  Negative for adenopathy. Does not bruise/bleed easily.  Psychiatric/Behavioral:  Negative for decreased concentration and dysphoric mood. The patient is not nervous/anxious.        Objective:   Physical Exam Constitutional:      General: She is not in acute distress.    Appearance: Normal appearance. She is well-developed. She is not ill-appearing or diaphoretic.  HENT:     Head: Normocephalic and atraumatic.     Right Ear: Tympanic membrane, ear canal and external ear normal.     Left Ear: Tympanic membrane, ear canal and external ear normal.     Nose: Nose normal. No congestion.     Mouth/Throat:     Mouth: Mucous membranes are moist.     Pharynx: Oropharynx is clear. No posterior oropharyngeal erythema.  Eyes:     General: No scleral icterus.    Extraocular Movements: Extraocular movements intact.     Conjunctiva/sclera: Conjunctivae normal.     Pupils:  Pupils are equal, round, and reactive to light.  Neck:     Thyroid: No thyromegaly.     Vascular: No carotid bruit or JVD.  Cardiovascular:     Rate and Rhythm: Normal rate and regular rhythm.     Pulses: Normal pulses.     Heart sounds: Murmur heard.     No gallop.  Pulmonary:     Effort: Pulmonary effort is normal. No respiratory distress.     Breath sounds: Normal breath sounds. No wheezing.     Comments: Good air exch Chest:     Chest wall: No tenderness.  Abdominal:     General: Bowel sounds are normal. There is no distension or abdominal bruit.     Palpations: Abdomen is soft. There is no mass.     Tenderness: There is no abdominal tenderness.     Hernia: No hernia is present.  Genitourinary:    Comments: Breast exam: No mass, nodules, thickening, tenderness, bulging, retraction, inflamation, nipple discharge or skin changes noted.  No axillary or clavicular LA.      Baseline surgical changes in left breast  Musculoskeletal:        General: No tenderness. Normal range of motion.     Cervical back: Normal range of motion and neck supple. No rigidity. No muscular tenderness.     Right lower leg: No edema.     Left lower leg: No edema.     Comments: Mid  kyphosis   Lymphadenopathy:     Cervical: No cervical adenopathy.  Skin:    General: Skin is warm and dry.     Coloration: Skin is not pale.     Findings: No erythema or rash.  Neurological:     Mental Status: She is alert. Mental status is at baseline.     Cranial Nerves: No cranial nerve deficit.     Motor: No abnormal muscle tone.     Coordination: Coordination normal.     Gait: Gait normal.     Deep Tendon Reflexes: Reflexes are normal and symmetric. Reflexes normal.  Psychiatric:        Mood and Affect: Mood normal.        Cognition and Memory: Cognition and memory normal.           Assessment & Plan:   Problem List Items Addressed This Visit       Cardiovascular and Mediastinum   Essential  hypertension, benign - Primary  bp in fair control at this time  BP Readings from Last 1 Encounters:  02/26/23 132/60   No changes needed Most recent labs reviewed  Disc lifstyle change with low sodium diet and exercise  Off imdur due to hypotension and syncope (no problems since stopping it)  Plan to continue Losartan 100 mg daily  Cored 3.125 mg bid           Digestive   GERD   Continues omeprazole 20 mg bid Unable to get off of it  Encouraged to avoid triggering foods   Watching B12 level        Musculoskeletal and Integument   Osteoporosis   Pt declines dexa or treatment currently  Past hip and vert fractures  Taking vitamin D and level is in the normal range  No new falls  Discussed fall prevention, supplements and exercise for bone density           Other   Vitamin D deficiency   Last vitamin D Lab Results  Component Value Date   VD25OH 56.82 02/19/2023   Vitamin D level is therapeutic with current supplementation Disc importance of this to bone and overall health In setting of osteoporosis       Prediabetes   Lab Results  Component Value Date   HGBA1C 6.4 02/19/2023   disc imp of low glycemic diet and wt loss to prevent DM2       Iron deficiency anemia   This is worse  Lab Results  Component Value Date   WBC 6.5 02/19/2023   HGB 9.5 (L) 02/19/2023   HCT 29.8 (L) 02/19/2023   MCV 75.4 (L) 02/19/2023   PLT 249.0 02/19/2023   Interested in iron infusion if she is a candidate Saw Dr Orlie Dakin in the past  Does not tolerate oral iron  Referral done Some fatigue       Relevant Medications   cyanocobalamin (VITAMIN B12) 1000 MCG/ML injection   Other Relevant Orders   Ambulatory referral to Hematology / Oncology   Hyperlipidemia   Disc goals for lipids and reasons to control them Rev last labs with pt Rev low sat fat diet in detail Well controlled with LDL of 51  In setting of CAD Taking crestor 10 mg daily       B12 deficiency    Lab Results  Component Value Date   VITAMINB12 >1537 (H) 02/19/2023   This is high  Will decrease frequency of B12 shots to every other month  No clinical changes

## 2023-02-26 NOTE — Assessment & Plan Note (Signed)
Last vitamin D Lab Results  Component Value Date   VD25OH 56.82 02/19/2023   Vitamin D level is therapeutic with current supplementation Disc importance of this to bone and overall health In setting of osteoporosis

## 2023-02-26 NOTE — Patient Instructions (Addendum)
If you want to get up to date with a tetanus shot, ask your pharmacist   Try to stay active and strong  Use your exercise bike   You can space out your B12 shots to every other month   To prevent diabetes Try to get most of your carbohydrates from produce (with the exception of white potatoes) and whole grains Eat less bread/pasta/rice/snack foods/cereals/sweets and other items from the middle of the grocery store (processed carbs)   I put the referral in for hematology to discuss an iron infusion  Please let us know if you don't hear in 1-2 weeks   Make sure to drink enough water to prevent constipation  Eat fruits and veggies Miralax over the counter is fine

## 2023-02-27 ENCOUNTER — Encounter: Payer: Medicare Other | Admitting: Family Medicine

## 2023-03-14 ENCOUNTER — Other Ambulatory Visit: Payer: Self-pay | Admitting: *Deleted

## 2023-03-14 DIAGNOSIS — D509 Iron deficiency anemia, unspecified: Secondary | ICD-10-CM

## 2023-03-17 ENCOUNTER — Inpatient Hospital Stay: Payer: Medicare Other

## 2023-03-18 ENCOUNTER — Inpatient Hospital Stay: Payer: Medicare Other | Admitting: Oncology

## 2023-03-18 ENCOUNTER — Inpatient Hospital Stay: Payer: Medicare Other

## 2023-03-24 ENCOUNTER — Inpatient Hospital Stay: Payer: Medicare Other | Attending: Oncology

## 2023-03-24 DIAGNOSIS — Z923 Personal history of irradiation: Secondary | ICD-10-CM | POA: Insufficient documentation

## 2023-03-24 DIAGNOSIS — E538 Deficiency of other specified B group vitamins: Secondary | ICD-10-CM | POA: Diagnosis not present

## 2023-03-24 DIAGNOSIS — Z87891 Personal history of nicotine dependence: Secondary | ICD-10-CM | POA: Diagnosis not present

## 2023-03-24 DIAGNOSIS — Z86000 Personal history of in-situ neoplasm of breast: Secondary | ICD-10-CM | POA: Diagnosis not present

## 2023-03-24 DIAGNOSIS — D509 Iron deficiency anemia, unspecified: Secondary | ICD-10-CM | POA: Insufficient documentation

## 2023-03-24 LAB — CBC WITH DIFFERENTIAL (CANCER CENTER ONLY)
Abs Immature Granulocytes: 0.01 10*3/uL (ref 0.00–0.07)
Basophils Absolute: 0 10*3/uL (ref 0.0–0.1)
Basophils Relative: 1 %
Eosinophils Absolute: 0.1 10*3/uL (ref 0.0–0.5)
Eosinophils Relative: 2 %
HCT: 30.3 % — ABNORMAL LOW (ref 36.0–46.0)
Hemoglobin: 9.4 g/dL — ABNORMAL LOW (ref 12.0–15.0)
Immature Granulocytes: 0 %
Lymphocytes Relative: 43 %
Lymphs Abs: 2.8 10*3/uL (ref 0.7–4.0)
MCH: 23.6 pg — ABNORMAL LOW (ref 26.0–34.0)
MCHC: 31 g/dL (ref 30.0–36.0)
MCV: 75.9 fL — ABNORMAL LOW (ref 80.0–100.0)
Monocytes Absolute: 0.6 10*3/uL (ref 0.1–1.0)
Monocytes Relative: 10 %
Neutro Abs: 2.9 10*3/uL (ref 1.7–7.7)
Neutrophils Relative %: 44 %
Platelet Count: 246 10*3/uL (ref 150–400)
RBC: 3.99 MIL/uL (ref 3.87–5.11)
RDW: 17.5 % — ABNORMAL HIGH (ref 11.5–15.5)
WBC Count: 6.4 10*3/uL (ref 4.0–10.5)
nRBC: 0 % (ref 0.0–0.2)

## 2023-03-24 LAB — FERRITIN: Ferritin: 5 ng/mL — ABNORMAL LOW (ref 11–307)

## 2023-03-24 LAB — IRON AND TIBC
Iron: 38 ug/dL (ref 28–170)
Saturation Ratios: 7 % — ABNORMAL LOW (ref 10.4–31.8)
TIBC: 521 ug/dL — ABNORMAL HIGH (ref 250–450)
UIBC: 483 ug/dL

## 2023-03-25 ENCOUNTER — Inpatient Hospital Stay: Payer: Medicare Other

## 2023-03-25 ENCOUNTER — Inpatient Hospital Stay (HOSPITAL_BASED_OUTPATIENT_CLINIC_OR_DEPARTMENT_OTHER): Payer: Medicare Other | Admitting: Oncology

## 2023-03-25 ENCOUNTER — Encounter: Payer: Self-pay | Admitting: Oncology

## 2023-03-25 ENCOUNTER — Ambulatory Visit: Payer: Medicare Other

## 2023-03-25 VITALS — BP 151/53 | HR 77 | Resp 16

## 2023-03-25 VITALS — BP 162/61 | HR 71 | Temp 98.0°F | Resp 16 | Ht 60.0 in | Wt 122.7 lb

## 2023-03-25 DIAGNOSIS — Z923 Personal history of irradiation: Secondary | ICD-10-CM | POA: Diagnosis not present

## 2023-03-25 DIAGNOSIS — D509 Iron deficiency anemia, unspecified: Secondary | ICD-10-CM

## 2023-03-25 DIAGNOSIS — E538 Deficiency of other specified B group vitamins: Secondary | ICD-10-CM | POA: Diagnosis not present

## 2023-03-25 DIAGNOSIS — Z87891 Personal history of nicotine dependence: Secondary | ICD-10-CM | POA: Diagnosis not present

## 2023-03-25 DIAGNOSIS — Z86000 Personal history of in-situ neoplasm of breast: Secondary | ICD-10-CM | POA: Diagnosis not present

## 2023-03-25 MED ORDER — SODIUM CHLORIDE 0.9 % IV SOLN
INTRAVENOUS | Status: DC
Start: 2023-03-25 — End: 2023-03-25
  Filled 2023-03-25: qty 250

## 2023-03-25 MED ORDER — SODIUM CHLORIDE 0.9 % IV SOLN
510.0000 mg | Freq: Once | INTRAVENOUS | Status: AC
  Administered 2023-03-25: 510 mg via INTRAVENOUS
  Filled 2023-03-25: qty 510

## 2023-03-25 NOTE — Progress Notes (Signed)
 [facility]  [phone] [phone]  ID: Jenna Young OB: Mar 11, 1932  MR#: [id number]  RDW#:[id number]  Patient Care Team: Randeen Laine LABOR, MD as PCP - General Perla Evalene PARAS, MD as PCP - Cardiology (Cardiology) Jacobo Evalene PARAS, MD as Consulting Physician (Oncology) Fate Morna SAILOR, [facility] (Inactive) as Pharmacist (Pharmacist)  CHIEF COMPLAINT: Iron deficiency anemia.  INTERVAL HISTORY: Patient last seen in clinic greater than 2 years ago.  She is referred back for further evaluation and consideration of additional IV Feraheme .  She currently feels well and is asymptomatic.  She does not complain of any weakness or fatigue. She has no neurologic complaints.  She denies any recent fevers or illnesses. She has no chest pain, shortness of breath, cough, or hemoptysis.  She denies any nausea, vomiting, constipation, or diarrhea.  She has no melena or hematochezia.  She has no urinary complaints.  Patient offers no specific complaints today.  REVIEW OF SYSTEMS:   Review of Systems  Constitutional: Negative.  Negative for fever, malaise/fatigue and weight loss.  Respiratory: Negative.  Negative for cough and shortness of breath.   Cardiovascular: Negative.  Negative for chest pain and leg swelling.  Gastrointestinal: Negative.  Negative for abdominal pain, blood in stool and melena.  Genitourinary: Negative.  Negative for dysuria and hematuria.  Musculoskeletal: Negative.  Negative for back pain and myalgias.  Skin: Negative.  Negative for rash.  Neurological: Negative.  Negative for dizziness, focal weakness, weakness and headaches.  Psychiatric/Behavioral: Negative.  The patient is not nervous/anxious.     As per HPI. Otherwise, a complete review of systems is negative.  PAST MEDICAL HISTORY: Past Medical History:  Diagnosis Date   Allergic rhinitis    Anemia    Anxiety    Breast cancer (HCC)    Left   CAD (coronary artery  disease)    a. 02/2017 Cath: OM1 90, RCA 30p, 21m, 95d (unsuccessful PCI->Med Rx); b. 05/2019 NSTEMI/Cath: LM nl, LAD mild diff dzs, LCX mild diff dzs, OM1 100 - fills via collats from OM2, RCA 30p, 71m, 177m, RPDA fills via collats from dLAD.   Chronic HFrEF (heart failure with mid-range ejection fraction) (HCC)    a. 05/2019 Echo: EF 40-45%, mild LVH, GrI DD, nl RV fxn, mildly dil LA, triv MR/AI.   GERD (gastroesophageal reflux disease)    History of radiation therapy 04/29/16- 05/24/16   Left Breast 40.05 Gy in 15 fractions, Left Breast boost 10 Gy in 5 fractions.    Hyperlipidemia    Hypertension    treated by Dr Cleven recently with syncope episode   Interstitial lung disease (HCC)    Ischemic cardiomyopathy    Osteopenia    Osteoporosis    Personal history of radiation therapy 2018   Syncope     PAST SURGICAL HISTORY: Past Surgical History:  Procedure Laterality Date   APPENDECTOMY     BREAST BIOPSY Left 11/29/2008   BREAST BIOPSY Left 01/26/2016   malignant   BREAST EXCISIONAL BIOPSY Left 12/26/2008   BREAST LUMPECTOMY Left 02/27/2016   BREAST LUMPECTOMY Left 04/01/2016   BREAST LUMPECTOMY WITH RADIOACTIVE SEED LOCALIZATION Left 02/27/2016   Procedure: LEFT BREAST LUMPECTOMY WITH RADIOACTIVE SEED LOCALIZATION;  Surgeon: Deward Null III, MD;  Location: [facility];  Service: General;  Laterality: Left;   BREAST SURGERY  10/10   benign biopsy  negative   CESAREAN SECTION     CHOLECYSTECTOMY     CORONARY STENT INTERVENTION N/A 02/11/2017  Procedure: CORONARY STENT INTERVENTION;  Surgeon: Mady Bruckner, MD;  Location: [facility];  Service: Cardiovascular;  Laterality: N/A;   FEMUR FRACTURE SURGERY Right 2006   HIP ARTHROPLASTY Left 06/28/2021   Procedure: ARTHROPLASTY BIPOLAR HIP (HEMIARTHROPLASTY);  Surgeon: Rollene Cough, MD;  Location: [facility];  Service: Orthopedics;  Laterality: Left;   LEFT HEART CATH AND CORONARY ANGIOGRAPHY N/A  02/11/2017   Procedure: LEFT HEART CATH AND CORONARY ANGIOGRAPHY;  Surgeon: Mady Bruckner, MD;  Location: [facility];  Service: Cardiovascular;  Laterality: N/A;   LEFT HEART CATH AND CORONARY ANGIOGRAPHY N/A 05/21/2019   Procedure: LEFT HEART CATH AND CORONARY ANGIOGRAPHY;  Surgeon: Darron Deatrice LABOR, MD;  Location: [facility];  Service: Cardiovascular;  Laterality: N/A;   OVARIAN CYST SURGERY     RE-EXCISION OF BREAST LUMPECTOMY Left 04/01/2016   Procedure: RE-EXCISION OF LEFT BREAST MEDIAL MARGIN;  Surgeon: Deward Null III, MD;  Location:  SURGERY CENTER;  Service: General;  Laterality: Left;    FAMILY HISTORY: Family History  Problem Relation Age of Onset   Hypertension Mother    Heart disease Father    Macular degeneration Sister    Diabetes Maternal Aunt    Cancer Maternal Aunt        throat    ADVANCED DIRECTIVES (Y/N):  N  HEALTH MAINTENANCE: Social History   Tobacco Use   Smoking status: Former    Current packs/day: 0.00    Average packs/day: 1 pack/day for 10.0 years (10.0 ttl pk-yrs)    Types: Cigarettes    Start date: 03/11/1958    Quit date: 03/11/1968    Years since quitting: 55.0   Smokeless tobacco: Never  Vaping Use   Vaping status: Never Used  Substance Use Topics   Alcohol use: No    Alcohol/week: 0.0 standard drinks of alcohol   Drug use: No     Colonoscopy:  PAP:  Bone density:  Lipid panel:  Allergies  Allergen Reactions   Buspirone Hcl     REACTION: HEART PALPITATIONS   Codeine     REACTION: nausea and vomiting   Hydrochlorothiazide      REACTION: syncope-possibly from dehydration   Iron     Oral iron -per pt made her pass out    Isosorbide  Nitrate Other (See Comments)    hypotension   Lansoprazole     REACTION: abd pain   Minocycline Hcl    Oxycodone  Other (See Comments)    Hallucinations    Penicillins     REACTION: mouth numbness Has patient had a PCN reaction causing immediate rash,  facial/tongue/throat swelling, SOB or lightheadedness with hypotension: No Has patient had a PCN reaction causing severe rash involving mucus membranes or skin necrosis: No Has patient had a PCN reaction that required hospitalization: No Has patient had a PCN reaction occurring within the last 10 years: No If all of the above answers are NO, then may proceed with Cephalosporin use.   Phenergan  [Promethazine  Hcl] Other (See Comments)    Body shakes, hallucinations   Ramipril      Cough    Ranexa  [Ranolazine ]     Constipation    Sulfa Antibiotics Nausea Only   Rosuvastatin      Patient reports muscle pain, unable to take. Stopped around 07/22.    Current Outpatient Medications  Medication Sig Dispense Refill   Budeson-Glycopyrrol-Formoterol  (BREZTRI  AEROSPHERE) 160-9-4.8 MCG/ACT AERO Inhale 2 puffs into the lungs in the morning and at bedtime.     carvedilol  (  COREG ) 3.125 MG tablet TAKE 1 TABLET(3.125 MG) BY MOUTH TWICE DAILY 180 tablet 3   Cholecalciferol  (VITAMIN D ) 2000 UNITS CAPS Take 1 capsule by mouth daily.     clopidogrel  (PLAVIX ) 75 MG tablet TAKE 1 TABLET(75 MG) BY MOUTH DAILY WITH BREAKFAST, crush pill if unable to swallow 90 tablet 1   cyanocobalamin  (VITAMIN B12) 1000 MCG/ML injection Inject 1 mL (1,000 mcg total) into the muscle every 2 (two) months.     docusate sodium  (COLACE) 100 MG capsule Take 2 capsules (200 mg total) by mouth 2 (two) times daily. 10 capsule 0   famotidine  (PEPCID ) 40 MG/5ML suspension Take 2.5 mLs (20 mg total) by mouth 2 (two) times daily. In mid morning and bedtime 50 mL 0   losartan  (COZAAR ) 100 MG tablet TAKE 1 TABLET(100 MG) BY MOUTH DAILY 90 tablet 3   meclizine  (ANTIVERT ) 25 MG tablet Take 25 mg by mouth 2 (two) times daily as needed.     Multiple Vitamin (MULTIVITAMIN WITH MINERALS) TABS tablet Take 1 tablet by mouth daily. 30 tablet 1   nitroGLYCERIN  (NITROSTAT ) 0.4 MG SL tablet PLACE 1 TABLET UNDER TONGUE EVERY 5 MIN AS NEEDED FOR CHEST PAIN  IF NO RELIEF IN15 MIN CALL 911 (MAX 3 TABS) 25 tablet 1   omeprazole  (PRILOSEC) 20 MG capsule Take 1 capsule (20 mg total) by mouth 2 (two) times daily before a meal. 180 capsule 1   rosuvastatin  (CRESTOR ) 10 MG tablet TAKE 1 TABLET(10 MG) BY MOUTH DAILY 90 tablet 3   Skin Protectants, Misc. (EUCERIN) cream Apply 1 application. topically as needed for dry skin.     No current facility-administered medications for this visit.    OBJECTIVE: Vitals:   03/25/23 1303  BP: (!) 162/61  Pulse: 71  Resp: 16  Temp: 98 F (36.7 C)  SpO2: 100%     Body mass index is 23.96 kg/m.    ECOG FS:0 - Asymptomatic  General: Well-developed, well-nourished, no acute distress. Eyes: Pink conjunctiva, anicteric sclera. HEENT: Normocephalic, moist mucous membranes. Lungs: No audible wheezing or coughing. Heart: Regular rate and rhythm. Abdomen: Soft, nontender, no obvious distention. Musculoskeletal: No edema, cyanosis, or clubbing. Neuro: Alert, answering all questions appropriately. Cranial nerves grossly intact. Skin: No rashes or petechiae noted. Psych: Normal affect.  LAB RESULTS:  Lab Results  Component Value Date   NA 136 02/19/2023   K 4.4 02/19/2023   CL 101 02/19/2023   CO2 26 02/19/2023   GLUCOSE 96 02/19/2023   BUN 13 02/19/2023   CREATININE 0.95 02/19/2023   CALCIUM  9.3 02/19/2023   PROT 8.1 02/19/2023   ALBUMIN 4.1 02/19/2023   AST 15 02/19/2023   ALT 7 02/19/2023   ALKPHOS 75 02/19/2023   BILITOT 0.5 02/19/2023   GFRNONAA 56 (L) 08/21/2022   GFRAA >60 05/21/2019    Lab Results  Component Value Date   WBC 6.4 03/24/2023   NEUTROABS 2.9 03/24/2023   HGB 9.4 (L) 03/24/2023   HCT 30.3 (L) 03/24/2023   MCV 75.9 (L) 03/24/2023   PLT 246 03/24/2023   Lab Results  Component Value Date   IRON 38 03/24/2023   TIBC 521 (H) 03/24/2023   IRONPCTSAT 7 (L) 03/24/2023   Lab Results  Component Value Date   FERRITIN 5 (L) 03/24/2023    STUDIES: No results  found.  ASSESSMENT: Iron deficiency anemia.  PLAN:   Iron deficiency anemia: Patient's hemoglobin and iron stores have significantly reduced.  Previously, all of her other laboratory work  was either negative or within normal limits.  Patient last received IV Feraheme  on May 26, 2019.  Proceed with 510 mg IV Feraheme  today.  Return to clinic in 1 week for IV Feraheme  only and then in 3 months for repeat laboratory work, further evaluation, and additional treatment if necessary.   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  Final pathology was noninvasive therefore she did not require chemotherapy.  Patient did not take tamoxifen given the ER/PR status of her tumor.  Her most recent mammogram on January 17, 2023 was reported as BI-RADS 1.  Repeat in November 2025.  These are ordered by her primary care physician.   B12 deficiency: Continue monthly B12 injections with primary care as needed.  I spent a total of 30 minutes reviewing chart data, face-to-face evaluation with the patient, counseling and coordination of care as detailed above.   Patient expressed understanding and was in agreement with this plan. She also understands that She can call clinic at any time with any questions, concerns, or complaints.    Cancer Staging  History of breast cancer Staging form: Breast, AJCC 7th Edition - Clinical stage from 01/26/2016: Stage 0 (Tis (DCIS), N0, M0) - Signed by Lanny Callander, MD on 02/26/2016 Laterality: Left Tumor grade (Scarff-Bloom-Richardson system): G2 Estrogen receptor status: Negative Progesterone receptor status: Negative - Pathologic: Stage 0 (Tis (DCIS), N0, cM0) - Unsigned   Evalene JINNY Reusing, MD   03/25/2023 1:23 PM

## 2023-03-25 NOTE — Patient Instructions (Signed)
 Ferumoxytol Injection What is this medication? FERUMOXYTOL (FER ue MOX i tol) treats low levels of iron in your body (iron deficiency anemia). Iron is a mineral that plays an important role in making red blood cells, which carry oxygen from your lungs to the rest of your body. This medicine may be used for other purposes; ask your health care provider or pharmacist if you have questions. COMMON BRAND NAME(S): Feraheme What should I tell my care team before I take this medication? They need to know if you have any of these conditions: Anemia not caused by low iron levels High levels of iron in the blood Magnetic resonance imaging (MRI) test scheduled An unusual or allergic reaction to iron, other medications, foods, dyes, or preservatives Pregnant or trying to get pregnant Breastfeeding How should I use this medication? This medication is injected into a vein. It is given by your care team in a hospital or clinic setting. Talk to your care team the use of this medication in children. Special care may be needed. Overdosage: If you think you have taken too much of this medicine contact a poison control center or emergency room at once. NOTE: This medicine is only for you. Do not share this medicine with others. What if I miss a dose? It is important not to miss your dose. Call your care team if you are unable to keep an appointment. What may interact with this medication? Other iron products This list may not describe all possible interactions. Give your health care provider a list of all the medicines, herbs, non-prescription drugs, or dietary supplements you use. Also tell them if you smoke, drink alcohol, or use illegal drugs. Some items may interact with your medicine. What should I watch for while using this medication? Visit your care team regularly. Tell your care team if your symptoms do not start to get better or if they get worse. You may need blood work done while you are taking this  medication. You may need to follow a special diet. Talk to your care team. Foods that contain iron include: whole grains/cereals, dried fruits, beans, or peas, leafy green vegetables, and organ meats (liver, kidney). What side effects may I notice from receiving this medication? Side effects that you should report to your care team as soon as possible: Allergic reactions--skin rash, itching, hives, swelling of the face, lips, tongue, or throat Low blood pressure--dizziness, feeling faint or lightheaded, blurry vision Shortness of breath Side effects that usually do not require medical attention (report to your care team if they continue or are bothersome): Flushing Headache Joint pain Muscle pain Nausea Pain, redness, or irritation at injection site This list may not describe all possible side effects. Call your doctor for medical advice about side effects. You may report side effects to FDA at 1-800-FDA-1088. Where should I keep my medication? This medication is given in a hospital or clinic. It will not be stored at home. NOTE: This sheet is a summary. It may not cover all possible information. If you have questions about this medicine, talk to your doctor, pharmacist, or health care provider.  2024 Elsevier/Gold Standard (2022-08-02 00:00:00)

## 2023-04-01 ENCOUNTER — Inpatient Hospital Stay: Payer: Medicare Other

## 2023-04-01 VITALS — BP 140/53 | HR 68 | Temp 97.1°F | Resp 19

## 2023-04-01 DIAGNOSIS — D509 Iron deficiency anemia, unspecified: Secondary | ICD-10-CM | POA: Diagnosis not present

## 2023-04-01 DIAGNOSIS — E538 Deficiency of other specified B group vitamins: Secondary | ICD-10-CM | POA: Diagnosis not present

## 2023-04-01 DIAGNOSIS — Z86000 Personal history of in-situ neoplasm of breast: Secondary | ICD-10-CM | POA: Diagnosis not present

## 2023-04-01 DIAGNOSIS — Z87891 Personal history of nicotine dependence: Secondary | ICD-10-CM | POA: Diagnosis not present

## 2023-04-01 DIAGNOSIS — Z923 Personal history of irradiation: Secondary | ICD-10-CM | POA: Diagnosis not present

## 2023-04-01 MED ORDER — SODIUM CHLORIDE 0.9 % IV SOLN
510.0000 mg | Freq: Once | INTRAVENOUS | Status: AC
  Administered 2023-04-01: 510 mg via INTRAVENOUS
  Filled 2023-04-01: qty 510

## 2023-04-24 ENCOUNTER — Ambulatory Visit: Payer: Medicare Other

## 2023-04-25 DIAGNOSIS — H02055 Trichiasis without entropian left lower eyelid: Secondary | ICD-10-CM | POA: Diagnosis not present

## 2023-04-25 DIAGNOSIS — Z961 Presence of intraocular lens: Secondary | ICD-10-CM | POA: Diagnosis not present

## 2023-04-25 DIAGNOSIS — H43813 Vitreous degeneration, bilateral: Secondary | ICD-10-CM | POA: Diagnosis not present

## 2023-04-25 DIAGNOSIS — H35433 Paving stone degeneration of retina, bilateral: Secondary | ICD-10-CM | POA: Diagnosis not present

## 2023-04-29 ENCOUNTER — Ambulatory Visit (INDEPENDENT_AMBULATORY_CARE_PROVIDER_SITE_OTHER): Payer: Medicare Other

## 2023-04-29 DIAGNOSIS — E538 Deficiency of other specified B group vitamins: Secondary | ICD-10-CM | POA: Diagnosis not present

## 2023-04-29 MED ORDER — CYANOCOBALAMIN 1000 MCG/ML IJ SOLN
1000.0000 ug | Freq: Once | INTRAMUSCULAR | Status: AC
  Administered 2023-04-29: 1000 ug via INTRAMUSCULAR

## 2023-04-29 NOTE — Progress Notes (Signed)
Per orders of Dr. Roxy Manns, injection of vitamin b 12 given by Lewanda Rife in right deltoid. Patient tolerated injection well. Patient will make appointment for 2 month.

## 2023-05-13 ENCOUNTER — Telehealth: Payer: Self-pay | Admitting: Cardiovascular Disease

## 2023-05-13 MED ORDER — LOSARTAN POTASSIUM 100 MG PO TABS: ORAL_TABLET | ORAL | 3 refills | Status: DC

## 2023-05-13 NOTE — Telephone Encounter (Signed)
 Requested Prescriptions   Signed Prescriptions Disp Refills   losartan (COZAAR) 100 MG tablet 90 tablet 3    Sig: TAKE 1 TABLET(100 MG) BY MOUTH DAILY    Authorizing Provider: Antonieta Iba    Ordering User: Kendrick Fries

## 2023-05-13 NOTE — Telephone Encounter (Signed)
*  STAT* If patient is at the pharmacy, call can be transferred to refill team.   1. Which medications need to be refilled? (please list name of each medication and dose if known)   losartan (COZAAR) 100 MG tablet   2. Would you like to learn more about the convenience, safety, & potential cost savings by using the [facility]?   3. Are you open to using the [facility] (Type [facility]. ).  4. Which pharmacy/location (including street and city if local pharmacy) is medication to be sent to?  [id number] - [place], [place] - [address]   5. Do they need a 30 day or 90 day supply?   90 day  Patient stated she only has a few tablets left.

## 2023-05-20 ENCOUNTER — Other Ambulatory Visit: Payer: Self-pay | Admitting: Family Medicine

## 2023-05-20 NOTE — Telephone Encounter (Signed)
 Copied from CRM [id number]. Topic: Clinical - Medication Refill >> May 20, 2023  9:59 AM Isabell A wrote: Most Recent Primary Care Visit:  Provider: Patience Musca  Department: [facility]  Visit Type: CLINICAL SUPPORT  Date: 04/29/2023  Medication: clopidogrel (PLAVIX) 75 MG tablet  Has the patient contacted their pharmacy? No (Agent: If no, request that the patient contact the pharmacy for the refill. If patient does not wish to contact the pharmacy document the reason why and proceed with request.) (Agent: If yes, when and what did the pharmacy advise?)  Is this the correct pharmacy for this prescription? Yes If no, delete pharmacy and type the correct one.  This is the patient's preferred pharmacy:  [id number] [place], [place] - [address] AT [facility] OF [address] [place] [address] Phone: [phone] Fax: [phone]   Has the prescription been filled recently? Yes  Is the patient out of the medication? No  Has the patient been seen for an appointment in the last year OR does the patient have an upcoming appointment? Yes  Can we respond through MyChart? No  Agent: Please be advised that Rx refills may take up to 3 business days. We ask that you follow-up with your pharmacy.

## 2023-05-20 NOTE — Telephone Encounter (Signed)
 Rx filled on 02/24/23 #90 tabs/ 1 refill (to soon). Not due until June 2025  Pt just need to check with pharmacy

## 2023-05-26 ENCOUNTER — Other Ambulatory Visit: Payer: Self-pay | Admitting: Family Medicine

## 2023-05-26 MED ORDER — CLOPIDOGREL BISULFATE 75 MG PO TABS: ORAL_TABLET | ORAL | 2 refills | Status: DC

## 2023-05-26 NOTE — Telephone Encounter (Signed)
 Copied from CRM [id number]. Topic: Clinical - Medication Refill >> May 26, 2023 11:10 AM Turkey A wrote: Most Recent Primary Care Visit:  Provider: Patience Musca  Department: [facility]  Visit Type: CLINICAL SUPPORT  Date: 04/29/2023  Medication: clopidogrel (PLAVIX) 75 MG tablet  Has the patient contacted their pharmacy? No (Agent: If no, request that the patient contact the pharmacy for the refill. If patient does not wish to contact the pharmacy document the reason why and proceed with request.) (Agent: If yes, when and what did the pharmacy advise?)  Is this the correct pharmacy for this prescription? Yes If no, delete pharmacy and type the correct one.  This is the patient's preferred pharmacy:  [id number] [place], [place] - [address] AT [facility] [address] [place] [address] Phone: [phone] Fax: [phone]   Has the prescription been filled recently? No  Is the patient out of the medication? Yes  Has the patient been seen for an appointment in the last year OR does the patient have an upcoming appointment? Yes  Can we respond through MyChart? No  Agent: Please be advised that Rx refills may take up to 3 business days. We ask that you follow-up with your pharmacy.

## 2023-05-26 NOTE — Telephone Encounter (Signed)
 Last refill 02/24/23 #90 w/ 1 REFILL  LOV 02/26/23 CPE  NOV nothing scheduled

## 2023-05-28 ENCOUNTER — Ambulatory Visit (INDEPENDENT_AMBULATORY_CARE_PROVIDER_SITE_OTHER): Payer: Medicare Other

## 2023-05-28 VITALS — Ht 60.0 in | Wt 122.0 lb

## 2023-05-28 DIAGNOSIS — Z0000 Encounter for general adult medical examination without abnormal findings: Secondary | ICD-10-CM | POA: Diagnosis not present

## 2023-05-28 NOTE — Progress Notes (Signed)
 Subjective:   Jenna Young is a 89 y.o. who presents for a Medicare Wellness preventive visit.  Visit Complete: Virtual I connected with  Jenna Young on 05/28/23 by a audio enabled telemedicine application and verified that I am speaking with the correct person using two identifiers.  Patient Location: Home  Provider Location: Office/Clinic  I discussed the limitations of evaluation and management by telemedicine. The patient expressed understanding and agreed to proceed.  Vital Signs: Because this visit was a virtual/telehealth visit, some criteria may be missing or patient reported. Any vitals not documented were not able to be obtained and vitals that have been documented are patient reported.  VideoDeclined- This patient declined Librarian, academic. Therefore the visit was completed with audio only.  Persons Participating in Visit: Patient.  AWV Questionnaire: No: Patient Medicare AWV questionnaire was not completed prior to this visit.  Cardiac Risk Factors include: advanced age (>76men, >1 women);dyslipidemia;hypertension     Objective:    Today's Vitals   05/28/23 1050  Weight: 122 lb (55.3 kg)  Height: 5' (1.524 m)   Body mass index is 23.83 kg/m.     05/28/2023   10:59 AM 03/25/2023    1:12 PM 05/23/2022    1:25 PM 06/28/2021    2:53 PM 06/27/2021    9:48 PM 06/27/2021    3:16 PM 05/10/2021    9:01 AM  Advanced Directives  Does Patient Have a Medical Advance Directive? No No No No No No No  Does patient want to make changes to medical advance directive?       Yes (MAU/Ambulatory/Procedural Areas - Information given)  Would patient like information on creating a medical advance directive?  No - Patient declined No - Patient declined No - Patient declined No - Patient declined      Current Medications (verified) Outpatient Encounter Medications as of 05/28/2023  Medication Sig   Budeson-Glycopyrrol-Formoterol (BREZTRI  AEROSPHERE) 160-9-4.8 MCG/ACT AERO Inhale 2 puffs into the lungs in the morning and at bedtime.   carvedilol (COREG) 3.125 MG tablet TAKE 1 TABLET(3.125 MG) BY MOUTH TWICE DAILY   Cholecalciferol (VITAMIN D) 2000 UNITS CAPS Take 1 capsule by mouth daily.   clopidogrel (PLAVIX) 75 MG tablet TAKE 1 TABLET(75 MG) BY MOUTH DAILY WITH BREAKFAST, crush pill if unable to swallow   cyanocobalamin (VITAMIN B12) 1000 MCG/ML injection Inject 1 mL (1,000 mcg total) into the muscle every 2 (two) months.   docusate sodium (COLACE) 100 MG capsule Take 2 capsules (200 mg total) by mouth 2 (two) times daily.   famotidine (PEPCID) 40 MG/5ML suspension Take 2.5 mLs (20 mg total) by mouth 2 (two) times daily. In mid morning and bedtime   losartan (COZAAR) 100 MG tablet TAKE 1 TABLET(100 MG) BY MOUTH DAILY   meclizine (ANTIVERT) 25 MG tablet Take 25 mg by mouth 2 (two) times daily as needed.   Multiple Vitamin (MULTIVITAMIN WITH MINERALS) TABS tablet Take 1 tablet by mouth daily.   nitroGLYCERIN (NITROSTAT) 0.4 MG SL tablet PLACE 1 TABLET UNDER TONGUE EVERY 5 MIN AS NEEDED FOR CHEST PAIN IF NO RELIEF IN15 MIN CALL 911 (MAX 3 TABS)   omeprazole (PRILOSEC) 20 MG capsule Take 1 capsule (20 mg total) by mouth 2 (two) times daily before a meal.   rosuvastatin (CRESTOR) 10 MG tablet TAKE 1 TABLET(10 MG) BY MOUTH DAILY   Skin Protectants, Misc. (EUCERIN) cream Apply 1 application. topically as needed for dry skin.   No facility-administered encounter  medications on file as of 05/28/2023.    Allergies (verified) Buspirone hcl, Codeine, Hydrochlorothiazide, Iron, Isosorbide nitrate, Lansoprazole, Minocycline hcl, Oxycodone, Penicillins, Phenergan [promethazine hcl], Ramipril, Ranexa [ranolazine], Sulfa antibiotics, and Rosuvastatin   History: Past Medical History:  Diagnosis Date   Allergic rhinitis    Anemia    Anxiety    Breast cancer (HCC)    Left   CAD (coronary artery disease)    a. 02/2017 Cath: OM1 90, RCA  30p, 51m, 95d (unsuccessful PCI->Med Rx); b. 05/2019 NSTEMI/Cath: LM nl, LAD mild diff dzs, LCX mild diff dzs, OM1 100 - fills via collats from OM2, RCA 30p, 70m, 110m, RPDA fills via collats from dLAD.   Chronic HFrEF (heart failure with mid-range ejection fraction) (HCC)    a. 05/2019 Echo: EF 40-45%, mild LVH, GrI DD, nl RV fxn, mildly dil LA, triv MR/AI.   GERD (gastroesophageal reflux disease)    History of radiation therapy 04/29/16- 05/24/16   Left Breast 40.05 Gy in 15 fractions, Left Breast boost 10 Gy in 5 fractions.    Hyperlipidemia    Hypertension    treated by Dr Eulis Canner recently with syncope episode   Interstitial lung disease (HCC)    Ischemic cardiomyopathy    Osteopenia    Osteoporosis    Personal history of radiation therapy 2018   Syncope    Past Surgical History:  Procedure Laterality Date   APPENDECTOMY     BREAST BIOPSY Left 11/29/2008   BREAST BIOPSY Left 01/26/2016   malignant   BREAST EXCISIONAL BIOPSY Left 12/26/2008   BREAST LUMPECTOMY Left 02/27/2016   BREAST LUMPECTOMY Left 04/01/2016   BREAST LUMPECTOMY WITH RADIOACTIVE SEED LOCALIZATION Left 02/27/2016   Procedure: LEFT BREAST LUMPECTOMY WITH RADIOACTIVE SEED LOCALIZATION;  Surgeon: Chevis Pretty III, MD;  Location: [facility];  Service: General;  Laterality: Left;   BREAST SURGERY  10/10   benign biopsy  negative   CESAREAN SECTION     CHOLECYSTECTOMY     CORONARY STENT INTERVENTION N/A 02/11/2017   Procedure: CORONARY STENT INTERVENTION;  Surgeon: Yvonne Kendall, MD;  Location: [facility];  Service: Cardiovascular;  Laterality: N/A;   FEMUR FRACTURE SURGERY Right 2006   HIP ARTHROPLASTY Left 06/28/2021   Procedure: ARTHROPLASTY BIPOLAR HIP (HEMIARTHROPLASTY);  Surgeon: Ross Marcus, MD;  Location: [facility];  Service: Orthopedics;  Laterality: Left;   LEFT HEART CATH AND CORONARY ANGIOGRAPHY N/A 02/11/2017   Procedure: LEFT HEART CATH AND CORONARY ANGIOGRAPHY;  Surgeon:  Yvonne Kendall, MD;  Location: [facility];  Service: Cardiovascular;  Laterality: N/A;   LEFT HEART CATH AND CORONARY ANGIOGRAPHY N/A 05/21/2019   Procedure: LEFT HEART CATH AND CORONARY ANGIOGRAPHY;  Surgeon: Iran Ouch, MD;  Location: [facility];  Service: Cardiovascular;  Laterality: N/A;   OVARIAN CYST SURGERY     RE-EXCISION OF BREAST LUMPECTOMY Left 04/01/2016   Procedure: RE-EXCISION OF LEFT BREAST MEDIAL MARGIN;  Surgeon: Chevis Pretty III, MD;  Location: [facility];  Service: General;  Laterality: Left;   Family History  Problem Relation Age of Onset   Hypertension Mother    Heart disease Father    Macular degeneration Sister    Diabetes Maternal Aunt    Cancer Maternal Aunt        throat   Social History   Socioeconomic History   Marital status: Widowed    Spouse name: Not on file   Number of children: 1   Years of education: Not  on file   Highest education level: Not on file  Occupational History    Employer: RETIRED  Tobacco Use   Smoking status: Former    Current packs/day: 0.00    Average packs/day: 1 pack/day for 10.0 years (10.0 ttl pk-yrs)    Types: Cigarettes    Start date: 03/11/1958    Quit date: 03/11/1968    Years since quitting: 55.2   Smokeless tobacco: Never  Vaping Use   Vaping status: Never Used  Substance and Sexual Activity   Alcohol use: No    Alcohol/week: 0.0 standard drinks of alcohol   Drug use: No   Sexual activity: Not Currently  Other Topics Concern   Not on file  Social History Narrative   Not on file   Social Drivers of Health   Financial Resource Strain: Low Risk  (05/28/2023)   Overall Financial Resource Strain (CARDIA)    Difficulty of Paying Living Expenses: Not hard at all  Food Insecurity: No Food Insecurity (05/28/2023)   Hunger Vital Sign    Worried About Running Out of Food in the Last Year: Never true    Ran Out of Food in the Last Year: Never true  Transportation Needs: No  Transportation Needs (05/28/2023)   PRAPARE - Administrator, Civil Service (Medical): No    Lack of Transportation (Non-Medical): No  Physical Activity: Inactive (05/28/2023)   Exercise Vital Sign    Days of Exercise per Week: 0 days    Minutes of Exercise per Session: 0 min  Stress: No Stress Concern Present (05/28/2023)   [employer] of Occupational Health - Occupational Stress Questionnaire    Feeling of Stress : Not at all  Social Connections: Socially Isolated (05/28/2023)   Social Connection and Isolation Panel [NHANES]    Frequency of Communication with Friends and Family: More than three times a week    Frequency of Social Gatherings with Friends and Family: Twice a week    Attends Religious Services: Never    Database administrator or Organizations: No    Attends Banker Meetings: Never    Marital Status: Widowed    Tobacco Counseling Counseling given: Not Answered  Clinical Intake:  Pre-visit preparation completed: Yes  Pain : No/denies pain    BMI - recorded: 23.83 Nutritional Status: BMI of 19-24  Normal Nutritional Risks: None Diabetes: No  Lab Results  Component Value Date   HGBA1C 6.4 02/19/2023   HGBA1C 6.2 05/11/2021   HGBA1C 6.0 05/03/2020     How often do you need to have someone help you when you read instructions, pamphlets, or other written materials from your doctor or pharmacy?: 1 - Never  Interpreter Needed?: No  Comments: lives alone Information entered by :: B.Niquita Digioia,LPN   Activities of Daily Living     05/28/2023   11:00 AM  In your present state of health, do you have any difficulty performing the following activities:  Hearing? 0  Vision? 0  Difficulty concentrating or making decisions? 0  Walking or climbing stairs? 1  Dressing or bathing? 0  Doing errands, shopping? 0  Preparing Food and eating ? N  Using the Toilet? N  In the past six months, have you accidently leaked urine? N  Do you have  problems with loss of bowel control? N  Managing your Medications? N  Managing your Finances? N  Housekeeping or managing your Housekeeping? N    Patient Care Team: Tower, Audrie Gallus, MD as PCP -  General Antonieta Iba, MD as PCP - Cardiology (Cardiology) Jeralyn Ruths, MD as Consulting Physician (Oncology) Kathyrn Sheriff, [facility] (Inactive) as Pharmacist (Pharmacist)  Indicate any recent Medical Services you may have received from other than [facility] providers in the past year (date may be approximate).     Assessment:   This is a routine wellness examination for Charlet.  Hearing/Vision screen Hearing Screening - Comments:: Pt says her hearing is ok with hearing aids Vision Screening - Comments:: Pt says her vision is good;only readers  Dr Larence Penning   Goals Addressed             This Visit's Progress    Patient Stated   On track    05/28/23-, I will maintain and continue medications as prescribed.      Patient Stated   On track    05/24/23-Would like to continue to exercise on stationary bike     Patient Stated   On track    05/28/23-Continue to be able to walk/will start with warm weather.       Depression Screen     05/28/2023   10:55 AM 02/26/2023    2:33 PM 09/11/2022   11:30 AM 05/23/2022    1:23 PM 04/16/2022    3:58 PM 05/10/2021    9:04 AM 05/09/2020    8:14 AM  PHQ 2/9 Scores  PHQ - 2 Score 0 0 0 0 0 0 0  PHQ- 9 Score  0 1 0 0  0    Fall Risk     05/28/2023   10:52 AM 02/26/2023    2:33 PM 09/11/2022   11:31 AM 05/23/2022    1:26 PM 04/16/2022    3:58 PM  Fall Risk   Falls in the past year? 0 0 0 1 1  Number falls in past yr: 0 0 0 0 0  Comment    under orth care   Injury with Fall? 0 0 0 1 1  Comment    Fx left hip   Risk for fall due to : No Fall Risks No Fall Risks No Fall Risks History of fall(s);Impaired balance/gait;Orthopedic patient History of fall(s)  Follow up Education provided;Falls prevention discussed Falls evaluation completed Falls  evaluation completed Education provided;Falls prevention discussed;Falls evaluation completed Falls evaluation completed    MEDICARE RISK AT HOME:  Medicare Risk at Home Any stairs in or around the home?: Yes If so, are there any without handrails?: Yes Home free of loose throw rugs in walkways, pet beds, electrical cords, etc?: Yes Adequate lighting in your home to reduce risk of falls?: Yes Life alert?: No Use of a cane, walker or w/c?: Yes (cane) Grab bars in the bathroom?: No (daughter with her when showers) Shower chair or bench in shower?: Yes Elevated toilet seat or a handicapped toilet?: Yes  TIMED UP AND GO:  Was the test performed?  No  Cognitive Function: 6CIT completed    05/09/2020    8:17 AM 04/14/2019    2:15 PM 04/08/2018    9:21 AM 03/31/2017   10:40 AM 03/21/2016    8:58 AM  MMSE - Mini Mental State Exam  Orientation to time 5 5 5 5 5   Orientation to Place 5 5 5 5 5   Registration 3 3 3 3 3   Attention/ Calculation 5 5 0 0 0  Recall 3 3 3 3 1   Recall-comments     pt was unable to recall 2 of 3 words  Language-  name 2 objects   0 0 0  Language- repeat 1 1 1 1 1   Language- follow 3 step command   3 3 3   Language- read & follow direction   0 0 0  Write a sentence   0 0 0  Copy design   0 0 0  Total score   20 20 18         05/28/2023   11:01 AM 05/23/2022    1:28 PM  6CIT Screen  What Year? 0 points 0 points  What month? 0 points 0 points  What time? 0 points 0 points  Count back from 20 0 points 0 points  Months in reverse 0 points 0 points  Repeat phrase 0 points 0 points  Total Score 0 points 0 points    Immunizations Immunization History  Administered Date(s) Administered   Fluad Quad(high Dose 65+) 12/29/2018, 12/11/2019, 01/12/2021, 11/19/2021   Fluad Trivalent(High Dose 65+) 01/07/2023   Influenza Split 12/11/2010, 01/17/2012   Influenza Whole 01/09/2006, 12/10/2006, 12/08/2007, 12/13/2009   Influenza,inj,Quad PF,6+ Mos 12/09/2012, 12/15/2013,  12/22/2014, 12/21/2015, 12/24/2016, 01/14/2018   PFIZER(Purple Top)SARS-COV-2 Vaccination 04/05/2019, 04/26/2019, 01/12/2020   Pneumococcal Conjugate-13 02/23/2014   Pneumococcal Polysaccharide-23 12/11/2010   Td 01/10/2008    Screening Tests Health Maintenance  Topic Date Due   Zoster Vaccines- Shingrix (1 of 2) Never done   DTaP/Tdap/Td (2 - Tdap) 02/26/2024 (Originally 01/09/2018)   COVID-19 Vaccine (4 - 2024-25 season) 03/13/2024 (Originally 11/10/2022)   DEXA SCAN  03/21/2025 (Originally 01/11/1999)   MAMMOGRAM  01/17/2024   Medicare Annual Wellness (AWV)  05/27/2024   Pneumonia Vaccine 65+ Years old  Completed   INFLUENZA VACCINE  Completed   HPV VACCINES  Aged Out   Colonoscopy  Discontinued    Health Maintenance  Health Maintenance Due  Topic Date Due   Zoster Vaccines- Shingrix (1 of 2) Never done   Health Maintenance Items Addressed:none needed   Additional Screening:  Vision Screening: Recommended annual ophthalmology exams for early detection of glaucoma and other disorders of the eye.  Dental Screening: Recommended annual dental exams for proper oral hygiene  Community Resource Referral / Chronic Care Management: CRR required this visit?  No   CCM required this visit?  No    Plan:     I have personally reviewed and noted the following in the patient's chart:   Medical and social history Use of alcohol, tobacco or illicit drugs  Current medications and supplements including opioid prescriptions. Patient is not currently taking opioid prescriptions. Functional ability and status Nutritional status Physical activity Advanced directives List of other physicians Hospitalizations, surgeries, and ER visits in previous 12 months Vitals Screenings to include cognitive, depression, and falls Referrals and appointments  In addition, I have reviewed and discussed with patient certain preventive protocols, quality metrics, and best practice recommendations. A  written personalized care plan for preventive services as well as general preventive health recommendations were provided to patient.    Sue Lush, LPN   06/10/270   After Visit Summary: (Declined) Due to this being a telephonic visit, with patients personalized plan was offered to patient but patient Declined AVS at this time   Notes: Nothing significant to report at this time.

## 2023-05-28 NOTE — Patient Instructions (Signed)
 [name] , Thank you for taking time to come for your Medicare Wellness Visit. I appreciate your ongoing commitment to your health goals. Please review the following plan we discussed and let me know if I can assist you in the future.   Referrals/Orders/Follow-Ups/Clinician Recommendations: none  This is a list of the screening recommended for you and due dates:  Health Maintenance  Topic Date Due   Zoster (Shingles) Vaccine (1 of 2) Never done   DTaP/Tdap/Td vaccine (2 - Tdap) 02/26/2024*   COVID-19 Vaccine (4 - 2024-25 season) 03/13/2024*   DEXA scan (bone density measurement)  03/21/2025*   Mammogram  01/17/2024   Medicare Annual Wellness Visit  05/27/2024   Pneumonia Vaccine  Completed   Flu Shot  Completed   HPV Vaccine  Aged Out   Colon Cancer Screening  Discontinued  *Topic was postponed. The date shown is not the original due date.    Advanced directives: (Declined) Advance directive discussed with you today. Even though you declined this today, please call our office should you change your mind, and we can give you the proper paperwork for you to fill out.  Next Medicare Annual Wellness Visit scheduled for next year: Yes 05/28/2024 @ 10:50am televisit

## 2023-06-03 ENCOUNTER — Ambulatory Visit (INDEPENDENT_AMBULATORY_CARE_PROVIDER_SITE_OTHER): Admitting: Family Medicine

## 2023-06-03 ENCOUNTER — Encounter: Payer: Self-pay | Admitting: Family Medicine

## 2023-06-03 VITALS — BP 128/72 | HR 71 | Temp 97.9°F | Ht 60.0 in | Wt 123.0 lb

## 2023-06-03 DIAGNOSIS — J301 Allergic rhinitis due to pollen: Secondary | ICD-10-CM | POA: Diagnosis not present

## 2023-06-03 DIAGNOSIS — Z1283 Encounter for screening for malignant neoplasm of skin: Secondary | ICD-10-CM | POA: Insufficient documentation

## 2023-06-03 MED ORDER — FEXOFENADINE HCL 180 MG PO TABS: 180.0000 mg | ORAL_TABLET | Freq: Every day | ORAL | 6 refills | Status: AC

## 2023-06-03 MED ORDER — FLUTICASONE PROPIONATE 50 MCG/ACT NA SUSP: 2.0000 | Freq: Every day | NASAL | 6 refills | Status: AC

## 2023-06-03 NOTE — Assessment & Plan Note (Signed)
 Claritin not working well for her  Has used flonase in past  Sent to pharmacy  Prescription flonase 2 sp per nostril once daily  Allegra 180 mg daily  Through allergy season  May have to get over the counter  Encouraged to avoid pollen /windy days when able Update if not starting to improve in a week or if worsening  Call back and Er precautions noted in detail today

## 2023-06-03 NOTE — Progress Notes (Signed)
 Subjective:    Patient ID: Jenna Young, female    DOB: [birth year], 89 y.o.   MRN: [id number]  HPI  Wt Readings from Last 3 Encounters:  06/03/23 123 lb (55.8 kg)  05/28/23 122 lb (55.3 kg)  03/25/23 122 lb 11.2 oz (55.7 kg)   24.02 kg/m  Vitals:   06/03/23 0905  BP: 128/72  Pulse: 71  Temp: 97.9 F (36.6 C)  SpO2: 99%    Pt presents with c/o skin spot on nose  Also allergy symptoms    Had a spot on her nose Looked like a scratch Her beautician told her to check it out  Then it went away  Itched a bit when she had it   Allergy symptoms  Pnd  Hoarse voice Clear runny nose (no color to mucous)  No ear symptoms  No dizziness Eyes are itchy / watery  Clears throat a lot  Lot of sneezing  Tries not to go out   No wheeze or cough    Over the counter  Claritin      Patient Active Problem List   Diagnosis Date Noted   Skin cancer screening 06/03/2023   Interstitial lung disease (HCC) 08/21/2022   Abnormal CXR 04/19/2022   History of hip fracture 11/19/2021   Poor balance 05/18/2021   Great toe pain, right 05/18/2021   Urinary frequency 03/15/2021   Intertrigo labialis 03/15/2021   Frequent urination 08/24/2020   History of COVID-19 08/03/2020   Dermatitis 05/03/2020   Secondary hypertension    Iron deficiency anemia 05/12/2019   Prediabetes 04/15/2018   Encounter for screening mammogram for breast cancer 04/01/2017   H/O syncope 02/28/2017   Coronary artery disease of native artery of native heart with stable angina pectoris (HCC) 02/21/2017   History of non-ST elevation myocardial infarction (NSTEMI) 02/10/2017   History of breast cancer 02/26/2016   Varicose vein of leg 10/09/2015   Fatigue 10/09/2015   Pedal edema 10/09/2015   Heart murmur 10/09/2015   History of spinal fracture 05/07/2015   Dysthymia 09/20/2013   Encounter for Medicare annual wellness exam 02/03/2013   Vitamin D deficiency 08/19/2008   Anxious mood 01/29/2008    Essential hypertension, benign 09/25/2007   B12 deficiency 09/24/2006   Hyperlipidemia 09/24/2006   Venous (peripheral) insufficiency 09/24/2006   Allergic rhinitis 09/24/2006   GERD 09/24/2006   Osteoporosis 09/24/2006   Past Medical History:  Diagnosis Date   Allergic rhinitis    Anemia    Anxiety    Breast cancer (HCC)    Left   CAD (coronary artery disease)    a. 02/2017 Cath: OM1 90, RCA 30p, 20m, 95d (unsuccessful PCI->Med Rx); b. 05/2019 NSTEMI/Cath: LM nl, LAD mild diff dzs, LCX mild diff dzs, OM1 100 - fills via collats from OM2, RCA 30p, 23m, 190m, RPDA fills via collats from dLAD.   Chronic HFrEF (heart failure with mid-range ejection fraction) (HCC)    a. 05/2019 Echo: EF 40-45%, mild LVH, GrI DD, nl RV fxn, mildly dil LA, triv MR/AI.   GERD (gastroesophageal reflux disease)    History of radiation therapy 04/29/16- 05/24/16   Left Breast 40.05 Gy in 15 fractions, Left Breast boost 10 Gy in 5 fractions.    Hyperlipidemia    Hypertension    treated by Dr Eulis Canner recently with syncope episode   Interstitial lung disease (HCC)    Ischemic cardiomyopathy    Osteopenia    Osteoporosis    Personal history of radiation therapy  2018   Syncope    Past Surgical History:  Procedure Laterality Date   APPENDECTOMY     BREAST BIOPSY Left 11/29/2008   BREAST BIOPSY Left 01/26/2016   malignant   BREAST EXCISIONAL BIOPSY Left 12/26/2008   BREAST LUMPECTOMY Left 02/27/2016   BREAST LUMPECTOMY Left 04/01/2016   BREAST LUMPECTOMY WITH RADIOACTIVE SEED LOCALIZATION Left 02/27/2016   Procedure: LEFT BREAST LUMPECTOMY WITH RADIOACTIVE SEED LOCALIZATION;  Surgeon: Chevis Pretty III, MD;  Location: [facility];  Service: General;  Laterality: Left;   BREAST SURGERY  10/10   benign biopsy  negative   CESAREAN SECTION     CHOLECYSTECTOMY     CORONARY STENT INTERVENTION N/A 02/11/2017   Procedure: CORONARY STENT INTERVENTION;  Surgeon: Yvonne Kendall, MD;  Location: [facility]  INVASIVE CV LAB;  Service: Cardiovascular;  Laterality: N/A;   FEMUR FRACTURE SURGERY Right 2006   HIP ARTHROPLASTY Left 06/28/2021   Procedure: ARTHROPLASTY BIPOLAR HIP (HEMIARTHROPLASTY);  Surgeon: Ross Marcus, MD;  Location: [facility];  Service: Orthopedics;  Laterality: Left;   LEFT HEART CATH AND CORONARY ANGIOGRAPHY N/A 02/11/2017   Procedure: LEFT HEART CATH AND CORONARY ANGIOGRAPHY;  Surgeon: Yvonne Kendall, MD;  Location: [facility] INV[facility]IVE CV [facility];  Service: Cardiovascular;  Laterality: N/A;   LEFT HEART CATH AND CORONARY ANGIOGRAPHY N/A 05/21/2019   Procedure: LEFT HEART CATH AND CORONARY ANGIOGRAPHY;  Surgeon: Iran Ouch, MD;  Location: [facility] INV[facility];  Service: Cardiovascular;  Laterality: N/A;   OVARIAN CYST SURGERY     RE-EXCISION OF BREAST LUMPECTOMY Left 04/01/2016   Procedure: RE-EXCISION OF LEFT BREAST MEDIAL MARGIN;  Surgeon: Chevis Pretty III, MD;  Location: [facility];  Service: General;  Laterality: Left;   Social History   Tobacco Use   Smoking status: Former    Current packs/day: 0.00    Average packs/day: 1 pack/day for 10.0 years (10.0 ttl pk-yrs)    Types: Cigarettes    Start date: 03/11/1958    Quit date: 03/11/1968    Years since quitting: 55.2   Smokeless tobacco: Never  Vaping Use   Vaping status: Never Used  Substance Use Topics   Alcohol use: No    Alcohol/week: 0.0 standard drinks of alcohol   Drug use: No   Family History  Problem Relation Age of Onset   Hypertension Mother    Heart disease Father    Macular degeneration Sister    Diabetes Maternal Aunt    Cancer Maternal Aunt        throat   Allergies  Allergen Reactions   Buspirone Hcl     REACTION: HEART PALPITATIONS   Codeine     REACTION: nausea and vomiting   Hydrochlorothiazide     REACTION: syncope-possibly from dehydration   Iron     Oral iron -per pt made her "pass out"    Isosorbide Nitrate Other (See Comments)    hypotension   Lansoprazole      REACTION: abd pain   Minocycline Hcl    Oxycodone Other (See Comments)    Hallucinations    Penicillins     REACTION: mouth numbness Has patient had a PCN reaction causing immediate rash, facial/tongue/throat swelling, SOB or lightheadedness with hypotension: No Has patient had a PCN reaction causing severe rash involving mucus membranes or skin necrosis: No Has patient had a PCN reaction that required hospitalization: No Has patient had a PCN reaction occurring within the last 10 years: No If all of the above answers are "  NO", then may proceed with Cephalosporin use.   Phenergan [Promethazine Hcl] Other (See Comments)    Body shakes, hallucinations   Ramipril     Cough    Ranexa [Ranolazine]     Constipation    Sulfa Antibiotics Nausea Only   Rosuvastatin     Patient reports muscle pain, unable to take. Stopped around 07/22.   Current Outpatient Medications on File Prior to Visit  Medication Sig Dispense Refill   Budeson-Glycopyrrol-Formoterol (BREZTRI AEROSPHERE) 160-9-4.8 MCG/ACT AERO Inhale 2 puffs into the lungs in the morning and at bedtime.     carvedilol (COREG) 3.125 MG tablet TAKE 1 TABLET(3.125 MG) BY MOUTH TWICE DAILY 180 tablet 3   Cholecalciferol (VITAMIN D) 2000 UNITS CAPS Take 1 capsule by mouth daily.     clopidogrel (PLAVIX) 75 MG tablet TAKE 1 TABLET(75 MG) BY MOUTH DAILY WITH BREAKFAST, crush pill if unable to swallow 90 tablet 2   cyanocobalamin (VITAMIN B12) 1000 MCG/ML injection Inject 1 mL (1,000 mcg total) into the muscle every 2 (two) months.     docusate sodium (COLACE) 100 MG capsule Take 2 capsules (200 mg total) by mouth 2 (two) times daily. 10 capsule 0   famotidine (PEPCID) 40 MG/5ML suspension Take 2.5 mLs (20 mg total) by mouth 2 (two) times daily. In mid morning and bedtime 50 mL 0   losartan (COZAAR) 100 MG tablet TAKE 1 TABLET(100 MG) BY MOUTH DAILY 90 tablet 3   meclizine (ANTIVERT) 25 MG tablet Take 25 mg by mouth 2 (two) times daily as needed.      Multiple Vitamin (MULTIVITAMIN WITH MINERALS) TABS tablet Take 1 tablet by mouth daily. 30 tablet 1   nitroGLYCERIN (NITROSTAT) 0.4 MG SL tablet PLACE 1 TABLET UNDER TONGUE EVERY 5 MIN AS NEEDED FOR CHEST PAIN IF NO RELIEF IN15 MIN CALL 911 (MAX 3 TABS) 25 tablet 1   omeprazole (PRILOSEC) 20 MG capsule Take 1 capsule (20 mg total) by mouth 2 (two) times daily before a meal. 180 capsule 1   rosuvastatin (CRESTOR) 10 MG tablet TAKE 1 TABLET(10 MG) BY MOUTH DAILY 90 tablet 3   Skin Protectants, Misc. (EUCERIN) cream Apply 1 application. topically as needed for dry skin.     No current facility-administered medications on file prior to visit.    Review of Systems  Constitutional:  Positive for fatigue. Negative for activity change, appetite change, fever and unexpected weight change.  HENT:  Positive for congestion, postnasal drip, rhinorrhea and sneezing. Negative for ear pain, sinus pressure, sore throat and trouble swallowing.   Eyes:  Negative for pain, redness and visual disturbance.  Respiratory:  Negative for cough, shortness of breath and wheezing.   Cardiovascular:  Negative for chest pain and palpitations.  Gastrointestinal:  Negative for abdominal pain, blood in stool, constipation and diarrhea.  Endocrine: Negative for polydipsia and polyuria.  Genitourinary:  Negative for dysuria, frequency and urgency.  Musculoskeletal:  Negative for arthralgias, back pain and myalgias.  Skin:  Negative for pallor, rash and wound.  Allergic/Immunologic: Negative for environmental allergies.  Neurological:  Negative for dizziness, syncope and headaches.  Hematological:  Negative for adenopathy. Does not bruise/bleed easily.  Psychiatric/Behavioral:  Negative for decreased concentration and dysphoric mood. The patient is not nervous/anxious.        Objective:   Physical Exam Constitutional:      General: She is not in acute distress.    Appearance: Normal appearance. She is well-developed  and normal weight. She is not ill-appearing  or diaphoretic.  HENT:     Head: Normocephalic and atraumatic.     Ears:     Comments: Hearing aides     Mouth/Throat:     Mouth: Mucous membranes are moist.     Pharynx: Oropharynx is clear. No oropharyngeal exudate or posterior oropharyngeal erythema.     Comments: Some clear pnd Eyes:     General:        Right eye: No discharge.        Left eye: No discharge.     Conjunctiva/sclera: Conjunctivae normal.     Pupils: Pupils are equal, round, and reactive to light.  Neck:     Thyroid: No thyromegaly.     Vascular: No carotid bruit or JVD.  Cardiovascular:     Rate and Rhythm: Normal rate and regular rhythm.     Heart sounds: Normal heart sounds.     No gallop.  Pulmonary:     Effort: Pulmonary effort is normal. No respiratory distress.     Breath sounds: Normal breath sounds. No stridor. No wheezing, rhonchi or rales.  Abdominal:     General: There is no distension or abdominal bruit.     Palpations: Abdomen is soft.  Musculoskeletal:     Cervical back: Normal range of motion and neck supple.     Right lower leg: No edema.     Left lower leg: No edema.  Lymphadenopathy:     Cervical: No cervical adenopathy.  Skin:    General: Skin is warm and dry.     Coloration: Skin is not pale.     Findings: No rash.     Comments: Solar aging Diffuse sks  Some seb hyperplasia on face   Area of prior lesion on nose is clear/no lesion noted   Neurological:     Mental Status: She is alert.     Coordination: Coordination normal.     Deep Tendon Reflexes: Reflexes are normal and symmetric. Reflexes normal.  Psychiatric:        Mood and Affect: Mood normal.           Assessment & Plan:   Problem List Items Addressed This Visit       Respiratory   Allergic rhinitis - Primary   Claritin not working well for her  Has used flonase in past  Sent to pharmacy  Prescription flonase 2 sp per nostril once daily  Allegra 180 mg daily   Through allergy season  May have to get over the counter  Encouraged to avoid pollen /windy days when able Update if not starting to improve in a week or if worsening  Call back and Er precautions noted in detail today          Other   Skin cancer screening   Pt had a spot on nose that is now gone (? If it was a scratch to start) On exam - seb hyperplasia and SKs Interested in skin cancer screening /derm visit  Ref to derm/pref [place]   Discussed use of sun protection       Relevant Orders   Ambulatory referral to Dermatology

## 2023-06-03 NOTE — Patient Instructions (Addendum)
 Try allegra 180 mg once daily instead of claritin  Add flonase nasal spray once daily as directed   Avoid pollen when you can    I put the referral in for dermatology for skin cancer screening  Please let us know if you don't hear in 1-2 weeks

## 2023-06-03 NOTE — Assessment & Plan Note (Signed)
 Pt had a spot on nose that is now gone (? If it was a scratch to start) On exam - seb hyperplasia and SKs Interested in skin cancer screening /derm visit  Ref to [employer]   Discussed use of sun protection

## 2023-06-17 ENCOUNTER — Telehealth: Payer: Self-pay | Admitting: *Deleted

## 2023-06-17 NOTE — Telephone Encounter (Signed)
 Copied from CRM [id number]. Topic: Referral - Question >> Jun 17, 2023  1:18 PM Alcus Dad wrote: Reason for CRM: Patient called about dermatologist appt. Upon checking notes... it looks like patient was denied because they are no longer taking new patients. Patient was told to call back if she have not heard anything back. Please give patient a call back regarding this matter.

## 2023-06-17 NOTE — Telephone Encounter (Signed)
 Will route to PCP as an FYI and referral dpt to f/u with pt

## 2023-06-17 NOTE — Telephone Encounter (Signed)
 Referral rerouted to [facility] ([place]) and patient made aware. She is aware to contact Dr Lucretia Roers office if she does not hear from them to schedule within the next few weeks.   Nothing further needed.

## 2023-06-19 ENCOUNTER — Other Ambulatory Visit: Payer: Self-pay | Admitting: *Deleted

## 2023-06-19 DIAGNOSIS — D509 Iron deficiency anemia, unspecified: Secondary | ICD-10-CM

## 2023-06-20 ENCOUNTER — Inpatient Hospital Stay: Payer: Medicare Other | Attending: Oncology

## 2023-06-20 DIAGNOSIS — Z87891 Personal history of nicotine dependence: Secondary | ICD-10-CM | POA: Insufficient documentation

## 2023-06-20 DIAGNOSIS — Z86000 Personal history of in-situ neoplasm of breast: Secondary | ICD-10-CM | POA: Insufficient documentation

## 2023-06-20 DIAGNOSIS — E538 Deficiency of other specified B group vitamins: Secondary | ICD-10-CM | POA: Diagnosis not present

## 2023-06-20 DIAGNOSIS — Z923 Personal history of irradiation: Secondary | ICD-10-CM | POA: Insufficient documentation

## 2023-06-20 DIAGNOSIS — Z862 Personal history of diseases of the blood and blood-forming organs and certain disorders involving the immune mechanism: Secondary | ICD-10-CM | POA: Diagnosis not present

## 2023-06-20 DIAGNOSIS — D509 Iron deficiency anemia, unspecified: Secondary | ICD-10-CM

## 2023-06-20 LAB — CBC WITH DIFFERENTIAL/PLATELET
Abs Immature Granulocytes: 0.02 10*3/uL (ref 0.00–0.07)
Basophils Absolute: 0 10*3/uL (ref 0.0–0.1)
Basophils Relative: 1 %
Eosinophils Absolute: 0.1 10*3/uL (ref 0.0–0.5)
Eosinophils Relative: 2 %
HCT: 36.6 % (ref 36.0–46.0)
Hemoglobin: 11.7 g/dL — ABNORMAL LOW (ref 12.0–15.0)
Immature Granulocytes: 0 %
Lymphocytes Relative: 34 %
Lymphs Abs: 2.3 10*3/uL (ref 0.7–4.0)
MCH: 27.3 pg (ref 26.0–34.0)
MCHC: 32 g/dL (ref 30.0–36.0)
MCV: 85.3 fL (ref 80.0–100.0)
Monocytes Absolute: 0.5 10*3/uL (ref 0.1–1.0)
Monocytes Relative: 8 %
Neutro Abs: 3.7 10*3/uL (ref 1.7–7.7)
Neutrophils Relative %: 55 %
Platelets: 185 10*3/uL (ref 150–400)
RBC: 4.29 MIL/uL (ref 3.87–5.11)
RDW: 19.5 % — ABNORMAL HIGH (ref 11.5–15.5)
WBC: 6.6 10*3/uL (ref 4.0–10.5)
nRBC: 0 % (ref 0.0–0.2)

## 2023-06-20 LAB — IRON AND TIBC
Iron: 93 ug/dL (ref 28–170)
Saturation Ratios: 31 % (ref 10.4–31.8)
TIBC: 301 ug/dL (ref 250–450)
UIBC: 208 ug/dL

## 2023-06-20 LAB — FERRITIN: Ferritin: 99 ng/mL (ref 11–307)

## 2023-06-23 ENCOUNTER — Inpatient Hospital Stay (HOSPITAL_BASED_OUTPATIENT_CLINIC_OR_DEPARTMENT_OTHER): Payer: Medicare Other | Admitting: Oncology

## 2023-06-23 ENCOUNTER — Inpatient Hospital Stay: Payer: Medicare Other

## 2023-06-23 ENCOUNTER — Encounter: Payer: Self-pay | Admitting: Oncology

## 2023-06-23 VITALS — BP 156/60 | HR 69 | Temp 97.9°F | Resp 20 | Wt 124.4 lb

## 2023-06-23 DIAGNOSIS — D509 Iron deficiency anemia, unspecified: Secondary | ICD-10-CM | POA: Diagnosis not present

## 2023-06-23 DIAGNOSIS — E538 Deficiency of other specified B group vitamins: Secondary | ICD-10-CM | POA: Diagnosis not present

## 2023-06-23 DIAGNOSIS — Z87891 Personal history of nicotine dependence: Secondary | ICD-10-CM | POA: Diagnosis not present

## 2023-06-23 DIAGNOSIS — Z86000 Personal history of in-situ neoplasm of breast: Secondary | ICD-10-CM | POA: Diagnosis not present

## 2023-06-23 DIAGNOSIS — Z923 Personal history of irradiation: Secondary | ICD-10-CM | POA: Diagnosis not present

## 2023-06-23 DIAGNOSIS — Z862 Personal history of diseases of the blood and blood-forming organs and certain disorders involving the immune mechanism: Secondary | ICD-10-CM | POA: Diagnosis not present

## 2023-06-23 NOTE — Progress Notes (Signed)
 [facility]  [phone] [phone]  ID: Jenna Young OB: 1932-02-26  MR#: [id number]  OZD#:[id number]  Patient Care Team: Judy Pimple, MD as PCP - General Mariah Milling Tollie Pizza, MD as PCP - Cardiology (Cardiology) Jeralyn Ruths, MD as Consulting Physician (Oncology) Kathyrn Sheriff, [facility] (Inactive) as Pharmacist (Pharmacist) Domingo Madeira, OD (Optometry)  CHIEF COMPLAINT: Iron deficiency anemia.  INTERVAL HISTORY: Patient returns to clinic today for repeat laboratory work, further evaluation, and consideration of additional IV Feraheme.  She currently feels well and is asymptomatic.  She does not complain of any weakness or fatigue today. She has no neurologic complaints.  She denies any recent fevers or illnesses. She has no chest pain, shortness of breath, cough, or hemoptysis.  She denies any nausea, vomiting, constipation, or diarrhea.  She has no melena or hematochezia.  She has no urinary complaints.  Patient offers no specific complaints today.  REVIEW OF SYSTEMS:   Review of Systems  Constitutional: Negative.  Negative for fever, malaise/fatigue and weight loss.  Respiratory: Negative.  Negative for cough and shortness of breath.   Cardiovascular: Negative.  Negative for chest pain and leg swelling.  Gastrointestinal: Negative.  Negative for abdominal pain, blood in stool and melena.  Genitourinary: Negative.  Negative for dysuria and hematuria.  Musculoskeletal: Negative.  Negative for back pain and myalgias.  Skin: Negative.  Negative for rash.  Neurological: Negative.  Negative for dizziness, focal weakness, weakness and headaches.  Psychiatric/Behavioral: Negative.  The patient is not nervous/anxious.     As per HPI. Otherwise, a complete review of systems is negative.  PAST MEDICAL HISTORY: Past Medical History:  Diagnosis Date   Allergic rhinitis    Anemia    Anxiety    Breast cancer (HCC)    Left   CAD  (coronary artery disease)    a. 02/2017 Cath: OM1 90, RCA 30p, 85m, 95d (unsuccessful PCI->Med Rx); b. 05/2019 NSTEMI/Cath: LM nl, LAD mild diff dzs, LCX mild diff dzs, OM1 100 - fills via collats from OM2, RCA 30p, 81m, 130m, RPDA fills via collats from dLAD.   Chronic HFrEF (heart failure with mid-range ejection fraction) (HCC)    a. 05/2019 Echo: EF 40-45%, mild LVH, GrI DD, nl RV fxn, mildly dil LA, triv MR/AI.   GERD (gastroesophageal reflux disease)    History of radiation therapy 04/29/16- 05/24/16   Left Breast 40.05 Gy in 15 fractions, Left Breast boost 10 Gy in 5 fractions.    Hyperlipidemia    Hypertension    treated by Dr Eulis Canner recently with syncope episode   Interstitial lung disease (HCC)    Ischemic cardiomyopathy    Osteopenia    Osteoporosis    Personal history of radiation therapy 2018   Syncope     PAST SURGICAL HISTORY: Past Surgical History:  Procedure Laterality Date   APPENDECTOMY     BREAST BIOPSY Left 11/29/2008   BREAST BIOPSY Left 01/26/2016   malignant   BREAST EXCISIONAL BIOPSY Left 12/26/2008   BREAST LUMPECTOMY Left 02/27/2016   BREAST LUMPECTOMY Left 04/01/2016   BREAST LUMPECTOMY WITH RADIOACTIVE SEED LOCALIZATION Left 02/27/2016   Procedure: LEFT BREAST LUMPECTOMY WITH RADIOACTIVE SEED LOCALIZATION;  Surgeon: Chevis Pretty III, MD;  Location: [facility];  Service: General;  Laterality: Left;   BREAST SURGERY  10/10   benign biopsy  negative   CESAREAN SECTION     CHOLECYSTECTOMY     CORONARY STENT INTERVENTION N/A 02/11/2017  Procedure: CORONARY STENT INTERVENTION;  Surgeon: Yvonne Kendall, MD;  Location: [facility];  Service: Cardiovascular;  Laterality: N/A;   FEMUR FRACTURE SURGERY Right 2006   HIP ARTHROPLASTY Left 06/28/2021   Procedure: ARTHROPLASTY BIPOLAR HIP (HEMIARTHROPLASTY);  Surgeon: Ross Marcus, MD;  Location: [facility];  Service: Orthopedics;  Laterality: Left;   LEFT HEART CATH AND CORONARY  ANGIOGRAPHY N/A 02/11/2017   Procedure: LEFT HEART CATH AND CORONARY ANGIOGRAPHY;  Surgeon: Yvonne Kendall, MD;  Location: [facility];  Service: Cardiovascular;  Laterality: N/A;   LEFT HEART CATH AND CORONARY ANGIOGRAPHY N/A 05/21/2019   Procedure: LEFT HEART CATH AND CORONARY ANGIOGRAPHY;  Surgeon: Iran Ouch, MD;  Location: [facility];  Service: Cardiovascular;  Laterality: N/A;   OVARIAN CYST SURGERY     RE-EXCISION OF BREAST LUMPECTOMY Left 04/01/2016   Procedure: RE-EXCISION OF LEFT BREAST MEDIAL MARGIN;  Surgeon: Chevis Pretty III, MD;  Location: [facility];  Service: General;  Laterality: Left;    FAMILY HISTORY: Family History  Problem Relation Age of Onset   Hypertension Mother    Heart disease Father    Macular degeneration Sister    Diabetes Maternal Aunt    Cancer Maternal Aunt        throat    ADVANCED DIRECTIVES (Y/N):  N  HEALTH MAINTENANCE: Social History   Tobacco Use   Smoking status: Former    Current packs/day: 0.00    Average packs/day: 1 pack/day for 10.0 years (10.0 ttl pk-yrs)    Types: Cigarettes    Start date: 03/11/1958    Quit date: 03/11/1968    Years since quitting: 55.3   Smokeless tobacco: Never  Vaping Use   Vaping status: Never Used  Substance Use Topics   Alcohol use: No    Alcohol/week: 0.0 standard drinks of alcohol   Drug use: No     Colonoscopy:  PAP:  Bone density:  Lipid panel:  Allergies  Allergen Reactions   Buspirone Hcl     REACTION: HEART PALPITATIONS   Codeine     REACTION: nausea and vomiting   Hydrochlorothiazide     REACTION: syncope-possibly from dehydration   Iron     Oral iron -per pt made her "pass out"    Isosorbide Nitrate Other (See Comments)    hypotension   Lansoprazole     REACTION: abd pain   Minocycline Hcl    Oxycodone Other (See Comments)    Hallucinations    Penicillins     REACTION: mouth numbness Has patient had a PCN reaction causing immediate rash,  facial/tongue/throat swelling, SOB or lightheadedness with hypotension: No Has patient had a PCN reaction causing severe rash involving mucus membranes or skin necrosis: No Has patient had a PCN reaction that required hospitalization: No Has patient had a PCN reaction occurring within the last 10 years: No If all of the above answers are "NO", then may proceed with Cephalosporin use.   Phenergan [Promethazine Hcl] Other (See Comments)    Body shakes, hallucinations   Ramipril     Cough    Ranexa [Ranolazine]     Constipation    Sulfa Antibiotics Nausea Only   Rosuvastatin     Patient reports muscle pain, unable to take. Stopped around 07/22.    Current Outpatient Medications  Medication Sig Dispense Refill   Budeson-Glycopyrrol-Formoterol (BREZTRI AEROSPHERE) 160-9-4.8 MCG/ACT AERO Inhale 2 puffs into the lungs in the morning and at bedtime.     carvedilol (  COREG) 3.125 MG tablet TAKE 1 TABLET(3.125 MG) BY MOUTH TWICE DAILY 180 tablet 3   Cholecalciferol (VITAMIN D) 2000 UNITS CAPS Take 1 capsule by mouth daily.     clopidogrel (PLAVIX) 75 MG tablet TAKE 1 TABLET(75 MG) BY MOUTH DAILY WITH BREAKFAST, crush pill if unable to swallow 90 tablet 2   cyanocobalamin (VITAMIN B12) 1000 MCG/ML injection Inject 1 mL (1,000 mcg total) into the muscle every 2 (two) months.     docusate sodium (COLACE) 100 MG capsule Take 2 capsules (200 mg total) by mouth 2 (two) times daily. 10 capsule 0   famotidine (PEPCID) 40 MG/5ML suspension Take 2.5 mLs (20 mg total) by mouth 2 (two) times daily. In mid morning and bedtime 50 mL 0   fexofenadine (ALLEGRA ALLERGY) 180 MG tablet Take 1 tablet (180 mg total) by mouth daily. 30 tablet 6   fluticasone (FLONASE) 50 MCG/ACT nasal spray Place 2 sprays into both nostrils daily. 16 g 6   losartan (COZAAR) 100 MG tablet TAKE 1 TABLET(100 MG) BY MOUTH DAILY 90 tablet 3   meclizine (ANTIVERT) 25 MG tablet Take 25 mg by mouth 2 (two) times daily as needed.     Multiple  Vitamin (MULTIVITAMIN WITH MINERALS) TABS tablet Take 1 tablet by mouth daily. 30 tablet 1   nitroGLYCERIN (NITROSTAT) 0.4 MG SL tablet PLACE 1 TABLET UNDER TONGUE EVERY 5 MIN AS NEEDED FOR CHEST PAIN IF NO RELIEF IN15 MIN CALL 911 (MAX 3 TABS) 25 tablet 1   omeprazole (PRILOSEC) 20 MG capsule Take 1 capsule (20 mg total) by mouth 2 (two) times daily before a meal. 180 capsule 1   rosuvastatin (CRESTOR) 10 MG tablet TAKE 1 TABLET(10 MG) BY MOUTH DAILY 90 tablet 3   Skin Protectants, Misc. (EUCERIN) cream Apply 1 application. topically as needed for dry skin.     No current facility-administered medications for this visit.    OBJECTIVE: Vitals:   06/23/23 1306  BP: (!) 156/60  Pulse: 69  Resp: 20  Temp: 97.9 F (36.6 C)  SpO2: 100%     Body mass index is 24.3 kg/m.    ECOG FS:0 - Asymptomatic  General: Well-developed, well-nourished, no acute distress. Eyes: Pink conjunctiva, anicteric sclera. HEENT: Normocephalic, moist mucous membranes. Lungs: No audible wheezing or coughing. Heart: Regular rate and rhythm. Abdomen: Soft, nontender, no obvious distention. Musculoskeletal: No edema, cyanosis, or clubbing. Neuro: Alert, answering all questions appropriately. Cranial nerves grossly intact. Skin: No rashes or petechiae noted. Psych: Normal affect.  LAB RESULTS:  Lab Results  Component Value Date   NA 136 02/19/2023   K 4.4 02/19/2023   CL 101 02/19/2023   CO2 26 02/19/2023   GLUCOSE 96 02/19/2023   BUN 13 02/19/2023   CREATININE 0.95 02/19/2023   CALCIUM 9.3 02/19/2023   PROT 8.1 02/19/2023   ALBUMIN 4.1 02/19/2023   AST 15 02/19/2023   ALT 7 02/19/2023   ALKPHOS 75 02/19/2023   BILITOT 0.5 02/19/2023   GFRNONAA 56 (L) 08/21/2022   GFRAA >60 05/21/2019    Lab Results  Component Value Date   WBC 6.6 06/20/2023   NEUTROABS 3.7 06/20/2023   HGB 11.7 (L) 06/20/2023   HCT 36.6 06/20/2023   MCV 85.3 06/20/2023   PLT 185 06/20/2023   Lab Results  Component Value  Date   IRON 93 06/20/2023   TIBC 301 06/20/2023   IRONPCTSAT 31 06/20/2023   Lab Results  Component Value Date   FERRITIN 99 06/20/2023  STUDIES: No results found.  ASSESSMENT: Iron deficiency anemia.  PLAN:   Iron deficiency anemia: Essentially resolved.  Patient's hemoglobin is nearly within normal limits at 11.7.  Iron panel was completely within normal limits.  Previously, all of her other laboratory work was either negative or within normal limits.  Patient last received IV Feraheme on April 01, 2023.  No further intervention is needed.  Return to clinic in 4 months with repeat laboratory work, further evaluation, and additional treatment if necessary.   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  Final pathology was noninvasive therefore she did not require chemotherapy.  Patient did not take tamoxifen given the ER/PR status of her tumor.  Her most recent mammogram on January 17, 2023 was reported as BI-RADS 1.  Repeat in November 2025.  These are ordered by her primary care physician.   B12 deficiency: Continue monthly B12 injections with primary care as needed.  Patient last received treatment on April 29, 2023.  I spent a total of 20 minutes reviewing chart data, face-to-face evaluation with the patient, counseling and coordination of care as detailed above.    Patient expressed understanding and was in agreement with this plan. She also understands that She can call clinic at any time with any questions, concerns, or complaints.    Cancer Staging  History of breast cancer Staging form: Breast, AJCC 7th Edition - Clinical stage from 01/26/2016: Stage 0 (Tis (DCIS), N0, M0) - Signed by Sonja [place], MD on 02/26/2016 Laterality: Left Tumor grade (Scarff-Bloom-Richardson system): G2 Estrogen receptor status: Negative Progesterone receptor status: Negative - Pathologic: Stage 0 (Tis (DCIS),  N0, cM0) - Unsigned   Shellie Dials, MD   06/23/2023 3:16 PM

## 2023-06-26 DIAGNOSIS — Z961 Presence of intraocular lens: Secondary | ICD-10-CM | POA: Diagnosis not present

## 2023-06-26 DIAGNOSIS — H43813 Vitreous degeneration, bilateral: Secondary | ICD-10-CM | POA: Diagnosis not present

## 2023-06-26 DIAGNOSIS — H35433 Paving stone degeneration of retina, bilateral: Secondary | ICD-10-CM | POA: Diagnosis not present

## 2023-06-26 DIAGNOSIS — H02055 Trichiasis without entropian left lower eyelid: Secondary | ICD-10-CM | POA: Diagnosis not present

## 2023-07-01 ENCOUNTER — Ambulatory Visit (INDEPENDENT_AMBULATORY_CARE_PROVIDER_SITE_OTHER): Payer: Medicare Other | Admitting: *Deleted

## 2023-07-01 DIAGNOSIS — E538 Deficiency of other specified B group vitamins: Secondary | ICD-10-CM

## 2023-07-01 MED ORDER — CYANOCOBALAMIN 1000 MCG/ML IJ SOLN
1000.0000 ug | Freq: Once | INTRAMUSCULAR | Status: AC
  Administered 2023-07-01: 1000 ug via INTRAMUSCULAR

## 2023-07-01 NOTE — Progress Notes (Signed)
 Per orders of Dr. Malissa Se, injection of Vitamin B12 given Left Deltoid by Corinthia Dickinson. Patient tolerated injection well. Patient will make appointment for 2 month.

## 2023-07-17 DIAGNOSIS — Z961 Presence of intraocular lens: Secondary | ICD-10-CM | POA: Diagnosis not present

## 2023-07-17 DIAGNOSIS — H43813 Vitreous degeneration, bilateral: Secondary | ICD-10-CM | POA: Diagnosis not present

## 2023-07-17 DIAGNOSIS — H04123 Dry eye syndrome of bilateral lacrimal glands: Secondary | ICD-10-CM | POA: Diagnosis not present

## 2023-07-17 DIAGNOSIS — H35433 Paving stone degeneration of retina, bilateral: Secondary | ICD-10-CM | POA: Diagnosis not present

## 2023-09-02 ENCOUNTER — Ambulatory Visit (INDEPENDENT_AMBULATORY_CARE_PROVIDER_SITE_OTHER)

## 2023-09-02 DIAGNOSIS — E538 Deficiency of other specified B group vitamins: Secondary | ICD-10-CM | POA: Diagnosis not present

## 2023-09-02 MED ORDER — CYANOCOBALAMIN 1000 MCG/ML IJ SOLN
1000.0000 ug | Freq: Once | INTRAMUSCULAR | Status: AC
  Administered 2023-09-02: 1000 ug via INTRAMUSCULAR

## 2023-09-02 NOTE — Progress Notes (Signed)
 Per orders of Dr. Roxy Manns, injection of vitamin b 12 given by Lewanda Rife in right deltoid. Patient tolerated injection well. Patient will make appointment for 2 month.

## 2023-09-15 DIAGNOSIS — H43813 Vitreous degeneration, bilateral: Secondary | ICD-10-CM | POA: Diagnosis not present

## 2023-09-15 DIAGNOSIS — H02052 Trichiasis without entropian right lower eyelid: Secondary | ICD-10-CM | POA: Diagnosis not present

## 2023-09-15 DIAGNOSIS — H16142 Punctate keratitis, left eye: Secondary | ICD-10-CM | POA: Diagnosis not present

## 2023-09-15 DIAGNOSIS — H35433 Paving stone degeneration of retina, bilateral: Secondary | ICD-10-CM | POA: Diagnosis not present

## 2023-09-15 DIAGNOSIS — H02055 Trichiasis without entropian left lower eyelid: Secondary | ICD-10-CM | POA: Diagnosis not present

## 2023-09-15 DIAGNOSIS — Z961 Presence of intraocular lens: Secondary | ICD-10-CM | POA: Diagnosis not present

## 2023-09-30 DIAGNOSIS — D2261 Melanocytic nevi of right upper limb, including shoulder: Secondary | ICD-10-CM | POA: Diagnosis not present

## 2023-09-30 DIAGNOSIS — L821 Other seborrheic keratosis: Secondary | ICD-10-CM | POA: Diagnosis not present

## 2023-09-30 DIAGNOSIS — D225 Melanocytic nevi of trunk: Secondary | ICD-10-CM | POA: Diagnosis not present

## 2023-09-30 DIAGNOSIS — D2262 Melanocytic nevi of left upper limb, including shoulder: Secondary | ICD-10-CM | POA: Diagnosis not present

## 2023-09-30 DIAGNOSIS — L570 Actinic keratosis: Secondary | ICD-10-CM | POA: Diagnosis not present

## 2023-10-27 ENCOUNTER — Inpatient Hospital Stay: Attending: Oncology

## 2023-10-27 ENCOUNTER — Other Ambulatory Visit: Payer: Self-pay | Admitting: *Deleted

## 2023-10-27 DIAGNOSIS — D509 Iron deficiency anemia, unspecified: Secondary | ICD-10-CM | POA: Insufficient documentation

## 2023-10-27 DIAGNOSIS — I10 Essential (primary) hypertension: Secondary | ICD-10-CM | POA: Diagnosis not present

## 2023-10-27 DIAGNOSIS — Z87891 Personal history of nicotine dependence: Secondary | ICD-10-CM | POA: Diagnosis not present

## 2023-10-27 DIAGNOSIS — Z79899 Other long term (current) drug therapy: Secondary | ICD-10-CM | POA: Diagnosis not present

## 2023-10-27 DIAGNOSIS — E538 Deficiency of other specified B group vitamins: Secondary | ICD-10-CM | POA: Insufficient documentation

## 2023-10-27 DIAGNOSIS — Z86000 Personal history of in-situ neoplasm of breast: Secondary | ICD-10-CM | POA: Insufficient documentation

## 2023-10-27 LAB — CBC WITH DIFFERENTIAL/PLATELET
Abs Immature Granulocytes: 0.02 K/uL (ref 0.00–0.07)
Basophils Absolute: 0 K/uL (ref 0.0–0.1)
Basophils Relative: 1 %
Eosinophils Absolute: 0.1 K/uL (ref 0.0–0.5)
Eosinophils Relative: 2 %
HCT: 34.1 % — ABNORMAL LOW (ref 36.0–46.0)
Hemoglobin: 11.1 g/dL — ABNORMAL LOW (ref 12.0–15.0)
Immature Granulocytes: 0 %
Lymphocytes Relative: 32 %
Lymphs Abs: 2.1 K/uL (ref 0.7–4.0)
MCH: 27.8 pg (ref 26.0–34.0)
MCHC: 32.6 g/dL (ref 30.0–36.0)
MCV: 85.5 fL (ref 80.0–100.0)
Monocytes Absolute: 0.6 K/uL (ref 0.1–1.0)
Monocytes Relative: 9 %
Neutro Abs: 3.8 K/uL (ref 1.7–7.7)
Neutrophils Relative %: 56 %
Platelets: 217 K/uL (ref 150–400)
RBC: 3.99 MIL/uL (ref 3.87–5.11)
RDW: 15.1 % (ref 11.5–15.5)
WBC: 6.7 K/uL (ref 4.0–10.5)
nRBC: 0 % (ref 0.0–0.2)

## 2023-10-27 LAB — IRON AND TIBC
Iron: 58 ug/dL (ref 28–170)
Saturation Ratios: 17 % (ref 10.4–31.8)
TIBC: 344 ug/dL (ref 250–450)
UIBC: 286 ug/dL

## 2023-10-27 LAB — FERRITIN: Ferritin: 66 ng/mL (ref 11–307)

## 2023-10-28 ENCOUNTER — Inpatient Hospital Stay (HOSPITAL_BASED_OUTPATIENT_CLINIC_OR_DEPARTMENT_OTHER): Admitting: Oncology

## 2023-10-28 ENCOUNTER — Encounter: Payer: Self-pay | Admitting: Oncology

## 2023-10-28 ENCOUNTER — Inpatient Hospital Stay

## 2023-10-28 VITALS — BP 162/85 | HR 79 | Temp 97.3°F | Resp 20 | Wt 122.5 lb

## 2023-10-28 DIAGNOSIS — Z79899 Other long term (current) drug therapy: Secondary | ICD-10-CM | POA: Diagnosis not present

## 2023-10-28 DIAGNOSIS — D509 Iron deficiency anemia, unspecified: Secondary | ICD-10-CM | POA: Diagnosis not present

## 2023-10-28 DIAGNOSIS — I10 Essential (primary) hypertension: Secondary | ICD-10-CM | POA: Diagnosis not present

## 2023-10-28 DIAGNOSIS — E538 Deficiency of other specified B group vitamins: Secondary | ICD-10-CM | POA: Diagnosis not present

## 2023-10-28 DIAGNOSIS — Z87891 Personal history of nicotine dependence: Secondary | ICD-10-CM | POA: Diagnosis not present

## 2023-10-28 DIAGNOSIS — Z86000 Personal history of in-situ neoplasm of breast: Secondary | ICD-10-CM | POA: Diagnosis not present

## 2023-10-28 NOTE — Progress Notes (Signed)
 [facility]  Telephone:(336) [phone] Fax:(336) [phone]  ID: Karn Derk OB: 1931/06/12  MR#: [id number]  RDW#:[id number]  Patient Care Team: Randeen Laine LABOR, MD as PCP - General Perla Evalene PARAS, MD as PCP - Cardiology (Cardiology) Jacobo Evalene PARAS, MD as Consulting Physician (Oncology) Fate Morna SAILOR, [facility] (Inactive) as Pharmacist (Pharmacist) Laurice Francis NOVAK, OD (Optometry)  CHIEF COMPLAINT: Iron deficiency anemia.  INTERVAL HISTORY: Patient returns to clinic today for repeat laboratory work, further evaluation, and consideration of additional IV Feraheme .  She continues to feel well and remains asymptomatic.  She does not complain of any weakness or fatigue.  She has no neurologic complaints.  She denies any recent fevers or illnesses. She has no chest pain, shortness of breath, cough, or hemoptysis.  She denies any nausea, vomiting, constipation, or diarrhea.  She has no melena or hematochezia.  She has no urinary complaints.  Patient offers no specific complaints today.  REVIEW OF SYSTEMS:   Review of Systems  Constitutional: Negative.  Negative for fever, malaise/fatigue and weight loss.  Respiratory: Negative.  Negative for cough and shortness of breath.   Cardiovascular: Negative.  Negative for chest pain and leg swelling.  Gastrointestinal: Negative.  Negative for abdominal pain, blood in stool and melena.  Genitourinary: Negative.  Negative for dysuria and hematuria.  Musculoskeletal: Negative.  Negative for back pain and myalgias.  Skin: Negative.  Negative for rash.  Neurological: Negative.  Negative for dizziness, focal weakness, weakness and headaches.  Psychiatric/Behavioral: Negative.  The patient is not nervous/anxious.     As per HPI. Otherwise, a complete review of systems is negative.  PAST MEDICAL HISTORY: Past Medical History:  Diagnosis Date   Allergic rhinitis    Anemia    Anxiety    Breast cancer (HCC)    Left   CAD  (coronary artery disease)    a. 02/2017 Cath: OM1 90, RCA 30p, 47m, 95d (unsuccessful PCI->Med Rx); b. 05/2019 NSTEMI/Cath: LM nl, LAD mild diff dzs, LCX mild diff dzs, OM1 100 - fills via collats from OM2, RCA 30p, 33m, 191m, RPDA fills via collats from dLAD.   Chronic HFrEF (heart failure with mid-range ejection fraction) (HCC)    a. 05/2019 Echo: EF 40-45%, mild LVH, GrI DD, nl RV fxn, mildly dil LA, triv MR/AI.   GERD (gastroesophageal reflux disease)    History of radiation therapy 04/29/16- 05/24/16   Left Breast 40.05 Gy in 15 fractions, Left Breast boost 10 Gy in 5 fractions.    Hyperlipidemia    Hypertension    treated by Dr Cleven recently with syncope episode   Interstitial lung disease (HCC)    Ischemic cardiomyopathy    Osteopenia    Osteoporosis    Personal history of radiation therapy 2018   Syncope     PAST SURGICAL HISTORY: Past Surgical History:  Procedure Laterality Date   APPENDECTOMY     BREAST BIOPSY Left 11/29/2008   BREAST BIOPSY Left 01/26/2016   malignant   BREAST EXCISIONAL BIOPSY Left 12/26/2008   BREAST LUMPECTOMY Left 02/27/2016   BREAST LUMPECTOMY Left 04/01/2016   BREAST LUMPECTOMY WITH RADIOACTIVE SEED LOCALIZATION Left 02/27/2016   Procedure: LEFT BREAST LUMPECTOMY WITH RADIOACTIVE SEED LOCALIZATION;  Surgeon: Deward Null III, MD;  Location: [facility];  Service: General;  Laterality: Left;   BREAST SURGERY  10/10   benign biopsy  negative   CESAREAN SECTION     CHOLECYSTECTOMY     CORONARY STENT INTERVENTION N/A 02/11/2017  Procedure: CORONARY STENT INTERVENTION;  Surgeon: Mady Bruckner, MD;  Location: [facility];  Service: Cardiovascular;  Laterality: N/A;   FEMUR FRACTURE SURGERY Right 2006   HIP ARTHROPLASTY Left 06/28/2021   Procedure: ARTHROPLASTY BIPOLAR HIP (HEMIARTHROPLASTY);  Surgeon: Rollene Cough, MD;  Location: [facility];  Service: Orthopedics;  Laterality: Left;   LEFT HEART CATH AND CORONARY  ANGIOGRAPHY N/A 02/11/2017   Procedure: LEFT HEART CATH AND CORONARY ANGIOGRAPHY;  Surgeon: Mady Bruckner, MD;  Location: [facility];  Service: Cardiovascular;  Laterality: N/A;   LEFT HEART CATH AND CORONARY ANGIOGRAPHY N/A 05/21/2019   Procedure: LEFT HEART CATH AND CORONARY ANGIOGRAPHY;  Surgeon: Darron Deatrice LABOR, MD;  Location: [facility];  Service: Cardiovascular;  Laterality: N/A;   OVARIAN CYST SURGERY     RE-EXCISION OF BREAST LUMPECTOMY Left 04/01/2016   Procedure: RE-EXCISION OF LEFT BREAST MEDIAL MARGIN;  Surgeon: Deward Null III, MD;  Location: [facility];  Service: General;  Laterality: Left;    FAMILY HISTORY: Family History  Problem Relation Age of Onset   Hypertension Mother    Heart disease Father    Macular degeneration Sister    Diabetes Maternal Aunt    Cancer Maternal Aunt        throat    ADVANCED DIRECTIVES (Y/N):  N  HEALTH MAINTENANCE: Social History   Tobacco Use   Smoking status: Former    Current packs/day: 0.00    Average packs/day: 1 pack/day for 10.0 years (10.0 ttl pk-yrs)    Types: Cigarettes    Start date: 03/11/1958    Quit date: 03/11/1968    Years since quitting: 55.6   Smokeless tobacco: Never  Vaping Use   Vaping status: Never Used  Substance Use Topics   Alcohol use: No    Alcohol/week: 0.0 standard drinks of alcohol   Drug use: No     Colonoscopy:  PAP:  Bone density:  Lipid panel:  Allergies  Allergen Reactions   Buspirone Hcl     REACTION: HEART PALPITATIONS   Codeine     REACTION: nausea and vomiting   Hydrochlorothiazide      REACTION: syncope-possibly from dehydration   Iron     Oral iron -per pt made her pass out    Isosorbide  Nitrate Other (See Comments)    hypotension   Lansoprazole     REACTION: abd pain   Minocycline Hcl    Oxycodone  Other (See Comments)    Hallucinations    Penicillins     REACTION: mouth numbness Has patient had a PCN reaction causing immediate rash,  facial/tongue/throat swelling, SOB or lightheadedness with hypotension: No Has patient had a PCN reaction causing severe rash involving mucus membranes or skin necrosis: No Has patient had a PCN reaction that required hospitalization: No Has patient had a PCN reaction occurring within the last 10 years: No If all of the above answers are NO, then may proceed with Cephalosporin use.   Phenergan  [Promethazine  Hcl] Other (See Comments)    Body shakes, hallucinations   Ramipril      Cough    Ranexa  [Ranolazine ]     Constipation    Sulfa Antibiotics Nausea Only   Rosuvastatin      Patient reports muscle pain, unable to take. Stopped around 07/22.    Current Outpatient Medications  Medication Sig Dispense Refill   Budeson-Glycopyrrol-Formoterol  (BREZTRI  AEROSPHERE) 160-9-4.8 MCG/ACT AERO Inhale 2 puffs into the lungs in the morning and at bedtime.     carvedilol  (  COREG ) 3.125 MG tablet TAKE 1 TABLET(3.125 MG) BY MOUTH TWICE DAILY 180 tablet 3   Cholecalciferol  (VITAMIN D ) 2000 UNITS CAPS Take 1 capsule by mouth daily.     clopidogrel  (PLAVIX ) 75 MG tablet TAKE 1 TABLET(75 MG) BY MOUTH DAILY WITH BREAKFAST, crush pill if unable to swallow 90 tablet 2   cyanocobalamin  (VITAMIN B12) 1000 MCG/ML injection Inject 1 mL (1,000 mcg total) into the muscle every 2 (two) months.     docusate sodium  (COLACE) 100 MG capsule Take 2 capsules (200 mg total) by mouth 2 (two) times daily. 10 capsule 0   famotidine  (PEPCID ) 40 MG/5ML suspension Take 2.5 mLs (20 mg total) by mouth 2 (two) times daily. In mid morning and bedtime 50 mL 0   fexofenadine  (ALLEGRA  ALLERGY) 180 MG tablet Take 1 tablet (180 mg total) by mouth daily. 30 tablet 6   fluticasone  (FLONASE ) 50 MCG/ACT nasal spray Place 2 sprays into both nostrils daily. 16 g 6   losartan  (COZAAR ) 100 MG tablet TAKE 1 TABLET(100 MG) BY MOUTH DAILY 90 tablet 3   meclizine  (ANTIVERT ) 25 MG tablet Take 25 mg by mouth 2 (two) times daily as needed.     Multiple  Vitamin (MULTIVITAMIN WITH MINERALS) TABS tablet Take 1 tablet by mouth daily. 30 tablet 1   nitroGLYCERIN  (NITROSTAT ) 0.4 MG SL tablet PLACE 1 TABLET UNDER TONGUE EVERY 5 MIN AS NEEDED FOR CHEST PAIN IF NO RELIEF IN15 MIN CALL 911 (MAX 3 TABS) 25 tablet 1   omeprazole  (PRILOSEC) 20 MG capsule Take 1 capsule (20 mg total) by mouth 2 (two) times daily before a meal. 180 capsule 1   rosuvastatin  (CRESTOR ) 10 MG tablet TAKE 1 TABLET(10 MG) BY MOUTH DAILY 90 tablet 3   Skin Protectants, Misc. (EUCERIN) cream Apply 1 application. topically as needed for dry skin.     No current facility-administered medications for this visit.    OBJECTIVE: Vitals:   10/28/23 1333  BP: (!) 162/85  Pulse: 79  Resp: 20  Temp: (!) 97.3 F (36.3 C)  SpO2: 100%      Body mass index is 23.92 kg/m.    ECOG FS:0 - Asymptomatic  General: Well-developed, well-nourished, no acute distress. Eyes: Pink conjunctiva, anicteric sclera. HEENT: Normocephalic, moist mucous membranes. Lungs: No audible wheezing or coughing. Heart: Regular rate and rhythm. Abdomen: Soft, nontender, no obvious distention. Musculoskeletal: No edema, cyanosis, or clubbing. Neuro: Alert, answering all questions appropriately. Cranial nerves grossly intact. Skin: No rashes or petechiae noted. Psych: Normal affect.   LAB RESULTS:  Lab Results  Component Value Date   NA 136 02/19/2023   K 4.4 02/19/2023   CL 101 02/19/2023   CO2 26 02/19/2023   GLUCOSE 96 02/19/2023   BUN 13 02/19/2023   CREATININE 0.95 02/19/2023   CALCIUM  9.3 02/19/2023   PROT 8.1 02/19/2023   ALBUMIN 4.1 02/19/2023   AST 15 02/19/2023   ALT 7 02/19/2023   ALKPHOS 75 02/19/2023   BILITOT 0.5 02/19/2023   GFRNONAA 56 (L) 08/21/2022   GFRAA >60 05/21/2019    Lab Results  Component Value Date   WBC 6.7 10/27/2023   NEUTROABS 3.8 10/27/2023   HGB 11.1 (L) 10/27/2023   HCT 34.1 (L) 10/27/2023   MCV 85.5 10/27/2023   PLT 217 10/27/2023   Lab Results   Component Value Date   IRON 58 10/27/2023   TIBC 344 10/27/2023   IRONPCTSAT 17 10/27/2023   Lab Results  Component Value Date   FERRITIN  66 10/27/2023    STUDIES: No results found.  ASSESSMENT: Iron deficiency anemia.  PLAN:   Iron deficiency anemia: Patient's hemoglobin remains mildly decreased at 11.1, but her iron stores continue to be within normal limits.  Previously, all of her other laboratory work was either negative or within normal limits.  Patient last received IV Feraheme  on April 01, 2023.  She does not require additional treatment today.  No further intervention is needed.  Return to clinic in 4 months with repeat laboratory work, further evaluation, and consideration of additional treatment if necessary. 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  Final pathology was noninvasive therefore she did not require chemotherapy.  Patient did not take tamoxifen given the ER/PR status of her tumor.  Her most recent mammogram on January 17, 2023 was reported as BI-RADS 1.  Repeat in November 2025.  These are ordered by her primary care physician.   B12 deficiency: Continue monthly B12 injections with primary care as needed.  Patient last received treatment on September 02, 2023. Hypertension: Patient's blood pressure is moderately elevated today.  Continue monitoring and treatment per primary care.   Patient expressed understanding and was in agreement with this plan. She also understands that She can call clinic at any time with any questions, concerns, or complaints.    Cancer Staging  History of breast cancer Staging form: Breast, AJCC 7th Edition - Clinical stage from 01/26/2016: Stage 0 (Tis (DCIS), N0, M0) - Signed by Lanny Callander, MD on 02/26/2016 Laterality: Left Tumor grade (Scarff-Bloom-Richardson system): G2 Estrogen receptor status: Negative Progesterone receptor status:  Negative - Pathologic: Stage 0 (Tis (DCIS), N0, cM0) - Unsigned   Evalene JINNY Reusing, MD   11/01/2023 8:01 AM

## 2023-11-01 ENCOUNTER — Encounter: Payer: Self-pay | Admitting: Oncology

## 2023-11-05 ENCOUNTER — Ambulatory Visit (INDEPENDENT_AMBULATORY_CARE_PROVIDER_SITE_OTHER)

## 2023-11-05 DIAGNOSIS — E538 Deficiency of other specified B group vitamins: Secondary | ICD-10-CM

## 2023-11-05 MED ORDER — CYANOCOBALAMIN 1000 MCG/ML IJ SOLN
1000.0000 ug | Freq: Once | INTRAMUSCULAR | Status: AC
  Administered 2023-11-05: 1000 ug via INTRAMUSCULAR

## 2023-11-05 NOTE — Progress Notes (Signed)
Per orders of Dr. Roxy Manns, injection of vitamin b 12 given by Lewanda Rife in left deltoid. Patient tolerated injection well. Patient will make appointment for 2 month.

## 2023-11-11 ENCOUNTER — Other Ambulatory Visit: Payer: Self-pay | Admitting: Cardiology

## 2023-11-17 ENCOUNTER — Telehealth: Payer: Self-pay | Admitting: Oncology

## 2023-11-17 NOTE — Telephone Encounter (Signed)
 Dr Georgina will be out of the office the week of christmas. I called the pt no answer and I was unable to leave a vm so I called the pt daughter Shona and notified her of the new appt dates/times. Also I'm sending out a appt reminder in the mail today.

## 2023-11-19 DIAGNOSIS — H04123 Dry eye syndrome of bilateral lacrimal glands: Secondary | ICD-10-CM | POA: Diagnosis not present

## 2023-11-19 DIAGNOSIS — H43813 Vitreous degeneration, bilateral: Secondary | ICD-10-CM | POA: Diagnosis not present

## 2023-11-19 DIAGNOSIS — H02052 Trichiasis without entropian right lower eyelid: Secondary | ICD-10-CM | POA: Diagnosis not present

## 2023-11-19 DIAGNOSIS — H35433 Paving stone degeneration of retina, bilateral: Secondary | ICD-10-CM | POA: Diagnosis not present

## 2023-11-19 DIAGNOSIS — Z961 Presence of intraocular lens: Secondary | ICD-10-CM | POA: Diagnosis not present

## 2023-11-19 DIAGNOSIS — H16142 Punctate keratitis, left eye: Secondary | ICD-10-CM | POA: Diagnosis not present

## 2023-11-19 DIAGNOSIS — H02055 Trichiasis without entropian left lower eyelid: Secondary | ICD-10-CM | POA: Diagnosis not present

## 2023-12-09 ENCOUNTER — Ambulatory Visit

## 2023-12-18 ENCOUNTER — Telehealth: Payer: Self-pay | Admitting: Cardiovascular Disease

## 2023-12-18 NOTE — Telephone Encounter (Signed)
 Returned pt's daughter phone call in regards to pt having chest and neck pain; daughter states that she has her mother at her home in [place] and that she is doing fine.  She feels her neck pain is from sleeping on different pillows and states that she had slight chest pain on Monday evening getting ready for bed, but it's the same pain that was discussed with Dr. Gollan at her last appointment.  They have an appointment for her annual check up with Dr. Gollan on 02/27/2024 and states that they do not need anything today.  Denies need for going to ED.  Pt (in background) verbalized she is fine.  All questions answered.

## 2023-12-18 NOTE — Telephone Encounter (Signed)
 Pt c/o of Chest Pain: STAT if active (IN THIS MOMENT) CP, including tightness, pressure, jaw pain, shoulder/upper arm/back pain, SOB, nausea, and vomiting.  1. Are you having CP right now (tightness, pressure, or discomfort)?  No   2. Are you experiencing any other symptoms (ex. SOB, nausea, vomiting, sweating)?  Neck pain   3. How long have you been experiencing CP?  Has been going on a while. Daughter says it was discussed during last visit (02/2023). Hasn't become any worse or more frequent.  4. Is your CP continuous or coming and going?  Coming and going--mainly occurs with minimal exertion or when getting ready for bed  5. Have you taken Nitroglycerin ? Daughter says patient takes nitroglycerin  when she has CP

## 2024-01-07 ENCOUNTER — Ambulatory Visit (INDEPENDENT_AMBULATORY_CARE_PROVIDER_SITE_OTHER)

## 2024-01-07 DIAGNOSIS — E538 Deficiency of other specified B group vitamins: Secondary | ICD-10-CM

## 2024-01-07 DIAGNOSIS — Z23 Encounter for immunization: Secondary | ICD-10-CM

## 2024-01-07 MED ORDER — CYANOCOBALAMIN 1000 MCG/ML IJ SOLN
1000.0000 ug | Freq: Once | INTRAMUSCULAR | Status: AC
  Administered 2024-01-07: 1000 ug via INTRAMUSCULAR

## 2024-01-07 NOTE — Progress Notes (Signed)
 Per orders of Dr. Laine Balls, injection of B-12 given by Delores, Lanora Reveron D in right deltoid. Patient tolerated injection well. Patient will make appointment for 2 month.

## 2024-01-12 ENCOUNTER — Other Ambulatory Visit: Payer: Self-pay | Admitting: Family Medicine

## 2024-01-12 DIAGNOSIS — Z1231 Encounter for screening mammogram for malignant neoplasm of breast: Secondary | ICD-10-CM

## 2024-01-24 ENCOUNTER — Other Ambulatory Visit: Payer: Self-pay | Admitting: Cardiovascular Disease

## 2024-01-26 DIAGNOSIS — H35433 Paving stone degeneration of retina, bilateral: Secondary | ICD-10-CM | POA: Diagnosis not present

## 2024-01-26 DIAGNOSIS — H02052 Trichiasis without entropian right lower eyelid: Secondary | ICD-10-CM | POA: Diagnosis not present

## 2024-01-26 DIAGNOSIS — Z961 Presence of intraocular lens: Secondary | ICD-10-CM | POA: Diagnosis not present

## 2024-01-26 DIAGNOSIS — H02055 Trichiasis without entropian left lower eyelid: Secondary | ICD-10-CM | POA: Diagnosis not present

## 2024-01-26 DIAGNOSIS — H04123 Dry eye syndrome of bilateral lacrimal glands: Secondary | ICD-10-CM | POA: Diagnosis not present

## 2024-01-26 DIAGNOSIS — H16142 Punctate keratitis, left eye: Secondary | ICD-10-CM | POA: Diagnosis not present

## 2024-01-26 DIAGNOSIS — H43813 Vitreous degeneration, bilateral: Secondary | ICD-10-CM | POA: Diagnosis not present

## 2024-02-05 ENCOUNTER — Other Ambulatory Visit: Payer: Self-pay

## 2024-02-05 ENCOUNTER — Encounter: Payer: Self-pay | Admitting: Medical Oncology

## 2024-02-05 ENCOUNTER — Emergency Department
Admission: EM | Admit: 2024-02-05 | Discharge: 2024-02-05 | Disposition: A | Discharge status: Home / Self Care | Attending: Emergency Medicine | Admitting: Emergency Medicine

## 2024-02-05 DIAGNOSIS — E871 Hypo-osmolality and hyponatremia: Secondary | ICD-10-CM | POA: Diagnosis not present

## 2024-02-05 DIAGNOSIS — I959 Hypotension, unspecified: Secondary | ICD-10-CM | POA: Diagnosis not present

## 2024-02-05 DIAGNOSIS — R197 Diarrhea, unspecified: Secondary | ICD-10-CM | POA: Diagnosis not present

## 2024-02-05 DIAGNOSIS — R55 Syncope and collapse: Secondary | ICD-10-CM | POA: Diagnosis not present

## 2024-02-05 LAB — COMPREHENSIVE METABOLIC PANEL WITH GFR
ALT: 9 U/L (ref 0–44)
AST: 18 U/L (ref 15–41)
Albumin: 3.9 g/dL (ref 3.5–5.0)
Alkaline Phosphatase: 68 U/L (ref 38–126)
Anion gap: 12 (ref 5–15)
BUN: 11 mg/dL (ref 8–23)
CO2: 22 mmol/L (ref 22–32)
Calcium: 9.1 mg/dL (ref 8.9–10.3)
Chloride: 97 mmol/L — ABNORMAL LOW (ref 98–111)
Creatinine, Ser: 0.91 mg/dL (ref 0.44–1.00)
GFR, Estimated: 60 mL/min — ABNORMAL LOW (ref >60)
Glucose, Bld: 105 mg/dL — ABNORMAL HIGH (ref 70–99)
Potassium: 4.3 mmol/L (ref 3.5–5.1)
Sodium: 131 mmol/L — ABNORMAL LOW (ref 135–145)
Total Bilirubin: 0.5 mg/dL (ref 0.0–1.2)
Total Protein: 7.5 g/dL (ref 6.5–8.1)

## 2024-02-05 LAB — URINALYSIS, ROUTINE W REFLEX MICROSCOPIC
Bilirubin Urine: NEGATIVE
Glucose, UA: NEGATIVE mg/dL
Hgb urine dipstick: NEGATIVE
Ketones, ur: NEGATIVE mg/dL
Nitrite: NEGATIVE
Protein, ur: NEGATIVE mg/dL
Specific Gravity, Urine: 1.006 (ref 1.005–1.030)
pH: 7 (ref 5.0–8.0)

## 2024-02-05 LAB — CBC
HCT: 32.7 % — ABNORMAL LOW (ref 36.0–46.0)
Hemoglobin: 10.5 g/dL — ABNORMAL LOW (ref 12.0–15.0)
MCH: 26.9 pg (ref 26.0–34.0)
MCHC: 32.1 g/dL (ref 30.0–36.0)
MCV: 83.8 fL (ref 80.0–100.0)
Platelets: 206 K/uL (ref 150–400)
RBC: 3.9 MIL/uL (ref 3.87–5.11)
RDW: 15.8 % — ABNORMAL HIGH (ref 11.5–15.5)
WBC: 7.8 K/uL (ref 4.0–10.5)
nRBC: 0 % (ref 0.0–0.2)

## 2024-02-05 LAB — TROPONIN T, HIGH SENSITIVITY
Troponin T High Sensitivity: 26 ng/L — ABNORMAL HIGH (ref 0–19)
Troponin T High Sensitivity: 24 ng/L — ABNORMAL HIGH (ref 0–19)

## 2024-02-05 MED ORDER — SODIUM CHLORIDE 0.9 % IV BOLUS
500.0000 mL | Freq: Once | INTRAVENOUS | Status: AC
  Administered 2024-02-05: 500 mL via INTRAVENOUS

## 2024-02-05 NOTE — Discharge Instructions (Signed)
 You were seen in the emergency department after passing out (syncope). Please arrange follow-up with your primary care doctor and cardiologist for further evaluation. Return to the ER immediately if you develop chest pain, shortness of breath, it feels like your heart is racing, repeated episodes, or other new or concerning symptoms.

## 2024-02-05 NOTE — ED Notes (Signed)
 Called CCMD to initiate cardiac monitoring.

## 2024-02-05 NOTE — ED Triage Notes (Signed)
 Pt from home via ems- pt was eating with family when she had a syncopal episode, did not fall to ground, pt reports standing and cooking all day. A/O x 4. Initial BP was 102 systolic when pt was stood BP dropped to 81SBP. Pt denies pain. States that she just feels a little weak all over.

## 2024-02-05 NOTE — ED Provider Notes (Signed)
 [facility] Provider Note    Event Date/Time   First MD Initiated Contact with Patient 02/05/24 1433     (approximate)   History   Near Syncope   HPI  Jenna Young is a 89 year old female presenting to the ER following a syncopal episode.  Patient was eating with her family when she felt lightheaded and had a witnessed syncopal episode.  Did not strike her head.  Event was witnessed by family.  Denies any chest pain or shortness of breath.  Does report that she has had a few episodes of diarrhea over the past few days which she thinks is related to the medication she is taking.  Had been cooking this morning and does not think that she had drink much fluid.  No abdominal pain or vomiting.  With EMS initial systolic of 102, decreased to 81 when standing.  Received about 300 cc of IV fluid and feels improved following this.      Physical Exam   Triage Vital Signs: ED Triage Vitals  Encounter Vitals Group     BP 02/05/24 1431 (!) 161/72     Girls Systolic BP Percentile --      Girls Diastolic BP Percentile --      Boys Systolic BP Percentile --      Boys Diastolic BP Percentile --      Pulse Rate 02/05/24 1428 75     Resp 02/05/24 1428 17     Temp 02/05/24 1428 98.3 F (36.8 C)     Temp Source 02/05/24 1428 Oral     SpO2 02/05/24 1428 96 %     Weight 02/05/24 1429 123 lb (55.8 kg)     Height 02/05/24 1429 5' (1.524 m)     Head Circumference --      Peak Flow --      Pain Score 02/05/24 1429 0     Pain Loc --      Pain Education --      Exclude from Growth Chart --     Most recent vital signs: Vitals:   02/05/24 1630 02/05/24 1700  BP: (!) 153/58 (!) 171/69  Pulse: 75   Resp: 17 18  Temp:    SpO2: 97%      General: Awake, interactive  CV:  Good peripheral perfusion Resp:  Unlabored respirations, lungs clear to auscultation Abd:  Nondistended.  Soft, nontender to palpation Neuro:  Symmetric facial movement, fluid speech, 5 out  of 5 strength of the bilateral upper and lower extremities with normal sensation   ED Results / Procedures / Treatments   Labs (all labs ordered are listed, but only abnormal results are displayed) Labs Reviewed  COMPREHENSIVE METABOLIC PANEL WITH GFR - Abnormal; Notable for the following components:      Result Value   Sodium 131 (*)    Chloride 97 (*)    Glucose, Bld 105 (*)    GFR, Estimated 60 (*)    All other components within normal limits  CBC - Abnormal; Notable for the following components:   Hemoglobin 10.5 (*)    HCT 32.7 (*)    RDW 15.8 (*)    All other components within normal limits  URINALYSIS, ROUTINE W REFLEX MICROSCOPIC - Abnormal; Notable for the following components:   Color, Urine YELLOW (*)    APPearance HAZY (*)    Leukocytes,Ua SMALL (*)    Bacteria, UA RARE (*)    All other components within normal limits  TROPONIN T, HIGH SENSITIVITY - Abnormal; Notable for the following components:   Troponin T High Sensitivity 26 (*)    All other components within normal limits  TROPONIN T, HIGH SENSITIVITY - Abnormal; Notable for the following components:   Troponin T High Sensitivity 24 (*)    All other components within normal limits     EKG EKG independently reviewed and interpreted by myself demonstrates:  EKG demonstrates sinus rhythm at a rate of 71, PR 135, QRS 148, QTc 479, left bundle branch block present, artifact present Repeat EKG obtained at 1452 demonstrating sinus rhythm at a rate of 71, PR 140, QRS 154, nonspecific ST changes, no STEMI  RADIOLOGY Imaging independently reviewed and interpreted by myself demonstrates:   Formal Radiology Read:  No results found.  PROCEDURES:  Critical Care performed: No  Procedures   MEDICATIONS ORDERED IN ED: Medications  sodium chloride  0.9 % bolus 500 mL (0 mLs Intravenous Stopped 02/05/24 1714)     IMPRESSION / MDM / ASSESSMENT AND PLAN / ED COURSE  I reviewed the triage vital signs and the  nursing notes.  Differential diagnosis includes, but is not limited to, arrhythmia, anemia, ACS, dehydration, medication adverse effect, orthostasis  Patient's presentation is most consistent with acute presentation with potential threat to life or bodily function.  89 year old female presenting to the emergency department for evaluation following a witnessed syncopal episode.  No cardiopulmonary symptoms.  Positive orthostatics per EMS, suspect possible dehydration from decreased fluid intake and recent GI losses.  Will obtain labs, treat with IV fluids.  Clinical Course as of 02/05/24 1745  Thu Feb 05, 2024  1658 CBC with mild anemia, similar to prior.  CMP with mild hyponatremia.  Troponin mildly elevated at 26, no recent prior for comparison.  Will obtain repeat to ensure not uptrending.  Patient remains without complaint including no chest pain. [NR]  1743 Repeat troponin stable at 24.  Patient remains without cardiopulmonary complaints.  Considered and discussed admission but patient does prefer discharge home which I do think is reasonable.  Lower suspicion cardiac syncope.  She will follow-up as an outpatient for further evaluation.  Strict return precautions provided.  Patient discharged in stable condition. [NR]    Clinical Course User Index [NR] Levander Slate, MD     FINAL CLINICAL IMPRESSION(S) / ED DIAGNOSES   Final diagnoses:  Syncope and collapse     Rx / DC Orders   ED Discharge Orders     None        Note:  This document was prepared using Dragon voice recognition software and may include unintentional dictation errors.   Levander Slate, MD 02/05/24 [phone]

## 2024-02-10 ENCOUNTER — Other Ambulatory Visit: Payer: Self-pay | Admitting: Family Medicine

## 2024-02-10 NOTE — Telephone Encounter (Unsigned)
 Copied from CRM #[id number]. Topic: Clinical - Medication Refill >> Feb 10, 2024 10:47 AM Burnard DEL wrote: Medication: clopidogrel  (PLAVIX ) 75 MG tablet   Has the patient contacted their pharmacy? No (Agent: If no, request that the patient contact the pharmacy for the refill. If patient does not wish to contact the pharmacy document the reason why and proceed with request.) (Agent: If yes, when and what did the pharmacy advise?)  This is the patient's preferred pharmacy:  [id number] [place], [place] - [address] AT [facility] OF [address] [place] [address] Phone: [phone] Fax: [phone]  Is this the correct pharmacy for this prescription? Yes If no, delete pharmacy and type the correct one.   Has the prescription been filled recently? No  Is the patient out of the medication? No(few left)  Has the patient been seen for an appointment in the last year OR does the patient have an upcoming appointment? Yes  Can we respond through MyChart? No  Agent: Please be advised that Rx refills may take up to 3 business days. We ask that you follow-up with your pharmacy.

## 2024-02-11 MED ORDER — CLOPIDOGREL BISULFATE 75 MG PO TABS: ORAL_TABLET | ORAL | 0 refills | Status: DC

## 2024-02-17 ENCOUNTER — Encounter

## 2024-02-24 NOTE — Progress Notes (Unsigned)
 Cardiology Office Note  Date:  02/27/2024   ID:  Jenna Young, DOB Aug 28, 1931, MRN [id number]  PCP:  Randeen Laine LABOR, MD   Chief Complaint  Patient presents with   12 month follow up     Patient c/o chest discomfort that comes and goes.     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  witnessed by family.  She had reported episodes of diarrhea prior to event In addition to her blood pressure medications, Was given IV fluids with improved blood pressure  Since then has been feeling well with no complications No further diarrhea episodes  Continues to have rare short episodes of sharp chest pain Sometimes takes nitro, reports symptoms are not frequent, not associated with exertion, unchanged over the past several years  Tolerating Crestor  10  EKG personally reviewed by myself on todays visit EKG Interpretation Date/Time:  Friday February 27 2024 16:29:54 EST Ventricular Rate:  74 PR Interval:  140 QRS Duration:  148 QT Interval:  432 QTC Calculation: 479 R Axis:   -31  Text Interpretation: Normal sinus rhythm Left axis deviation Left bundle branch block When compared with ECG of 05-Feb-2024 14:52, No significant change was found Confirmed by Perla Lye [id number]) on 02/27/2024 4:50:33 PM   Several episodes of near syncope/syncope in 2021  NSTEMI 05/2019 cardiac catheterization showing occluded RCA with collaterals from left to right  Long hx of anemia,  HGB 11 Iron infusions x2 Follows up with heme  Syncope 2020  on Saturday, was in the garden, 11 AM Reports that she was carrying some pot plants outside, started to feel dizzy Made her way inside but was dizzy, finally sat down inside Sister presents with her today reports that she had syncope per the son who was there with her Had bowel  incontinence Did not hurt herself  06/13/2019, reports that she had episode of syncope Ate some toast, then went shopping Maybe had some tea when she was out on the road Came back home, family was there, immediately started cooking Was in the kitchen when she felt weird, saw black spots, had to sit down Became unresponsive on the couch Family later on the floor Could not find pulse Blood pressure 61/40s, pulse ? EMTs arrived, she came too  Chronic arm weakness started in March 2020 Stop the statin on her own symptoms did not get better Arm discomfort when moving the garbage can, raising her arms for long periods.  They feel tired  Prior episode of syncope while at the beauty parlor Had hair done, got up to a when she felt lightheaded and passed out  She did not fall or sustain any injuries from this event.   When EMS arrived patient's blood glucose was 137 and her blood pressure was 74/43 for which she received 500 cc of fluid.  In the hospital, TNT 2.48  started on heparin , medical management No cardiac catheterization performed as numbers were trending down and she was asymptomatic Etiology of the events unclear whether this  was vasovagal, arrhythmia,  Lab work did not appear to be indicative of dehydration  hospital Admission for NSTEMI , 02/10/2017 D/c 02/13/2017  Cath Significant mid RCA disease with sequential, calcified 80 and 95% stenoses. Element of thrombus is likely also present. 90% ostial stenosis involving small OM1 branch. Mildly reduced left ventricular contraction with inferior hypokinesis (LVEF ~45%). Mildly elevated left  ventricular filling pressure. Unsuccessful PCI to mid RCA due to inability to cross the mid RCA stenosis despite aggressive buddy wire support.  Recommendation : if she has unstable angina symptoms could consider PCI with atherectomy to mid RCA   Echo 02/2017 Systolic function was normal. The   estimated ejection fraction was in the range of 50% to 55%.   Hypokinesis of the inferior myocardium.  diagnosis of DCIS, had a biopsy bx Completed XRT   previously on amlodipine  but wanted to change secondary to leg edema Started on bystolic  Blood pressure continue to run high Started on HCTZ every other day  could not afford Bystolic  long term   was changed to clonidine .  Presented to the emergency room December 31 2015 with near syncope , dehydration  Fell asleep in the sun  while she was on a swing set, was difficult to arouse, was taken inside by family, had large bowel movement   in the emergency room heart rate 62 bpm , sodium down to 129, creatinine elevated Notes indicate she was only taking clonidine  once a day   PMH:   has a past medical history of Allergic rhinitis, Anemia, Anxiety, Breast cancer (HCC), CAD (coronary artery disease), Chronic HFrEF (heart failure with mid-range ejection fraction) (HCC), GERD (gastroesophageal reflux disease), History of radiation therapy (04/29/16- 05/24/16), Hyperlipidemia, Hypertension, Interstitial lung disease (HCC), Ischemic cardiomyopathy, Osteopenia, Osteoporosis, Personal history of radiation therapy (2018), and Syncope.  PSH:    Past Surgical History:  Procedure Laterality Date   APPENDECTOMY     BREAST BIOPSY Left 11/29/2008   BREAST BIOPSY Left 01/26/2016   malignant   BREAST EXCISIONAL BIOPSY Left 12/26/2008   BREAST LUMPECTOMY Left 02/27/2016   BREAST LUMPECTOMY Left 04/01/2016   BREAST LUMPECTOMY WITH RADIOACTIVE SEED LOCALIZATION Left 02/27/2016   Procedure: LEFT BREAST LUMPECTOMY WITH RADIOACTIVE SEED LOCALIZATION;  Surgeon: Deward Null III, MD;  Location: [facility];  Service: General;  Laterality: Left;   BREAST SURGERY  10/10   benign biopsy  negative   CESAREAN SECTION     CHOLECYSTECTOMY     CORONARY STENT INTERVENTION N/A 02/11/2017   Procedure: CORONARY STENT INTERVENTION;  Surgeon: Mady Bruckner, MD;  Location: [facility];  Service: Cardiovascular;  Laterality: N/A;   FEMUR FRACTURE SURGERY Right 2006   HIP ARTHROPLASTY Left 06/28/2021   Procedure: ARTHROPLASTY BIPOLAR HIP (HEMIARTHROPLASTY);  Surgeon: Rollene Cough, MD;  Location: [facility];  Service: Orthopedics;  Laterality: Left;   LEFT HEART CATH AND CORONARY ANGIOGRAPHY N/A 02/11/2017   Procedure: LEFT HEART CATH AND CORONARY ANGIOGRAPHY;  Surgeon: Mady Bruckner, MD;  Location: [facility];  Service: Cardiovascular;  Laterality: N/A;   LEFT HEART CATH AND CORONARY ANGIOGRAPHY N/A 05/21/2019   Procedure: LEFT HEART CATH AND CORONARY ANGIOGRAPHY;  Surgeon: Darron Deatrice LABOR, MD;  Location: [facility];  Service: Cardiovascular;  Laterality: N/A;   OVARIAN CYST SURGERY     RE-EXCISION OF BREAST LUMPECTOMY Left 04/01/2016   Procedure: RE-EXCISION OF LEFT BREAST MEDIAL MARGIN;  Surgeon: Deward Null III, MD;  Location: [facility];  Service: General;  Laterality: Left;    Current Outpatient Medications  Medication Sig Dispense Refill   Budeson-Glycopyrrol-Formoterol  (BREZTRI  AEROSPHERE) 160-9-4.8 MCG/ACT AERO Inhale 2 puffs into the lungs in the morning and at bedtime.     carvedilol  (COREG ) 3.125 MG tablet TAKE 1 TABLET(3.125 MG) BY MOUTH TWICE DAILY 180 tablet 3   Cholecalciferol  (VITAMIN D ) 2000 UNITS CAPS Take 1 capsule by mouth daily.     clopidogrel  (PLAVIX ) 75 MG tablet TAKE 1 TABLET(75 MG) BY MOUTH DAILY WITH BREAKFAST, crush pill if unable to swallow 90 tablet 0   cyanocobalamin  (VITAMIN B12) 1000 MCG/ML injection Inject 1 mL (1,000 mcg total) into the muscle every 2 (two) months.     docusate  sodium (COLACE) 100 MG capsule Take 2 capsules (200 mg total) by mouth 2 (two) times daily. 10 capsule 0   famotidine  (PEPCID ) 40 MG/5ML suspension Take 2.5 mLs (20 mg total) by mouth 2 (two) times daily. In mid morning and bedtime 50 mL 0   fexofenadine  (ALLEGRA  ALLERGY) 180 MG tablet Take 1 tablet (180 mg total) by mouth daily. 30 tablet 6   fluticasone  (FLONASE ) 50 MCG/ACT nasal spray Place 2 sprays into both nostrils daily. 16 g 6   losartan  (COZAAR ) 100 MG tablet TAKE 1 TABLET(100 MG) BY MOUTH DAILY 90 tablet 3   meclizine  (ANTIVERT ) 25 MG tablet Take 25 mg by mouth 2 (two) times daily as needed.     Multiple Vitamin (MULTIVITAMIN WITH MINERALS) TABS tablet Take 1 tablet by mouth daily. 30 tablet 1   nitroGLYCERIN  (NITROSTAT ) 0.4 MG SL tablet PLACE 1 TABLET UNDER TONGUE EVERY 5 MIN AS NEEDED FOR CHEST PAIN IF NO RELIEF IN15 MIN CALL 911 (MAX 3 TABS) 25 tablet 1   omeprazole  (PRILOSEC) 20 MG capsule TAKE 1 CAPSULE(20 MG) BY MOUTH TWICE DAILY BEFORE A MEAL 180 capsule 1   rosuvastatin  (CRESTOR ) 10 MG tablet TAKE 1 TABLET(10 MG) BY MOUTH DAILY 90 tablet 3   Skin Protectants, Misc. (EUCERIN) cream Apply 1 application. topically as needed for dry skin.     No current facility-administered medications for this visit.     Allergies:   Buspirone hcl, Codeine, Hydrochlorothiazide , Iron, Isosorbide  nitrate, Lansoprazole, Minocycline hcl, Oxycodone , Penicillins, Phenergan  [promethazine  hcl], Ramipril , Ranexa  [ranolazine ], Sulfa antibiotics, and Rosuvastatin    Social History:  The patient  reports that she quit smoking about 56 years ago. Her smoking use included cigarettes. She started smoking about 66 years ago. She has a 10 pack-year smoking history. She has never used smokeless tobacco. She reports that she does not drink alcohol and does not use drugs.   Family History:   family history includes Cancer in her maternal aunt; Diabetes in her maternal aunt; Heart disease in her father; Hypertension in  her mother; Macular degeneration in her sister.   Review of Systems  HENT: Negative.    Eyes: Negative.   Respiratory: Negative.    Cardiovascular:  Positive for chest pain.  Gastrointestinal: Negative.   Genitourinary: Negative.   Musculoskeletal: Negative.   Neurological: Negative.   Psychiatric/Behavioral: Negative.    All other systems reviewed and are negative.   PHYSICAL EXAM: VS:  BP (!) 138/58 (BP Location: Left Arm, Patient Position: Sitting, Cuff Size: Normal)   Pulse 74   Ht 5' (1.524 m)   Wt 121 lb (54.9 kg)   SpO2 96%   BMI 23.63 kg/m  , BMI Body mass index is 23.63 kg/m. Constitutional:  oriented to person, place, and time. No distress.  HENT:  Head: Grossly normal Eyes:  no discharge. No scleral icterus.  Neck: No JVD, no carotid bruits  Cardiovascular: Regular rate and rhythm, no murmurs appreciated Pulmonary/Chest: Clear to auscultation bilaterally, no wheezes or rails Abdominal: Soft.  no distension.  no tenderness.  Musculoskeletal: Normal range of motion Neurological:  normal muscle tone. Coordination normal. No atrophy Skin: Skin warm and dry Psychiatric: normal affect, pleasant  Recent Labs: 02/05/2024: ALT 9; BUN 11; Creatinine, Ser 0.91; Hemoglobin 10.5; Platelets 206; Potassium 4.3; Sodium 131    Lipid Panel Lab Results  Component Value Date   CHOL 133 02/19/2023   HDL 67.40 02/19/2023   LDLCALC 51 02/19/2023   TRIG 76.0 02/19/2023    Wt Readings from Last 3 Encounters:  02/27/24 121 lb (54.9 kg)  02/05/24 123 lb (55.8 kg)  10/28/23 122 lb 8 oz (55.6 kg)     ASSESSMENT AND PLAN:  Coronary artery disease with stable angina Prior catheterization with chronically occluded RCA collaterals from left to right Chronic stable angina symptoms Previously declined stress testing or catheterization -Rare episodes of chest pain unchanged from prior visits, typically resolved with taking peppermint, Mylanta, rare sublingual  nitroglycerin   GERD Reports symptoms controlled, typically takes Mylanta, omeprazole  20 twice daily  Syncope Recent episode as well as prior episodes dating back several years Most recent episode of syncope in the setting of diarrhea, dehydration - Recommend we avoid overaggressive management of blood pressure - Recommend if she does have diarrhea spells that she holds her carvedilol  and losartan  until GI symptoms improve  Essential hypertension, benign -  Blood pressure is well controlled on today's visit. No changes made to the medications.  Pure hypercholesterolemia Continue crestor  10, numbers at goal Myalgias on Lipitor  Ductal carcinoma in situ (DCIS) of left breast Treatment is complete, radiation to the left   Orders Placed This Encounter  Procedures   EKG 12-Lead    Signed, Velinda Lunger, M.D., Ph.D. 02/27/2024  [facility] [place], [place] [phone]

## 2024-02-27 ENCOUNTER — Encounter: Payer: Self-pay | Admitting: Cardiovascular Disease

## 2024-02-27 ENCOUNTER — Ambulatory Visit: Attending: Cardiovascular Disease | Admitting: Cardiovascular Disease

## 2024-02-27 VITALS — BP 138/58 | HR 74 | Ht 60.0 in | Wt 121.0 lb

## 2024-02-27 DIAGNOSIS — Z87898 Personal history of other specified conditions: Secondary | ICD-10-CM | POA: Diagnosis not present

## 2024-02-27 DIAGNOSIS — I25118 Atherosclerotic heart disease of native coronary artery with other forms of angina pectoris: Secondary | ICD-10-CM | POA: Insufficient documentation

## 2024-02-27 DIAGNOSIS — M791 Myalgia, unspecified site: Secondary | ICD-10-CM | POA: Diagnosis not present

## 2024-02-27 DIAGNOSIS — T466X5S Adverse effect of antihyperlipidemic and antiarteriosclerotic drugs, sequela: Secondary | ICD-10-CM | POA: Diagnosis not present

## 2024-02-27 DIAGNOSIS — E782 Mixed hyperlipidemia: Secondary | ICD-10-CM | POA: Insufficient documentation

## 2024-02-27 DIAGNOSIS — I10 Essential (primary) hypertension: Secondary | ICD-10-CM | POA: Diagnosis not present

## 2024-02-27 DIAGNOSIS — J849 Interstitial pulmonary disease, unspecified: Secondary | ICD-10-CM | POA: Insufficient documentation

## 2024-02-27 DIAGNOSIS — T466X5A Adverse effect of antihyperlipidemic and antiarteriosclerotic drugs, initial encounter: Secondary | ICD-10-CM | POA: Insufficient documentation

## 2024-02-27 DIAGNOSIS — I5022 Chronic systolic (congestive) heart failure: Secondary | ICD-10-CM | POA: Insufficient documentation

## 2024-02-27 MED ORDER — NITROGLYCERIN 0.4 MG SL SUBL: SUBLINGUAL_TABLET | SUBLINGUAL | 4 refills | Status: DC

## 2024-02-27 MED ORDER — LOSARTAN POTASSIUM 100 MG PO TABS: ORAL_TABLET | ORAL | 3 refills | Status: AC

## 2024-02-27 MED ORDER — CLOPIDOGREL BISULFATE 75 MG PO TABS: ORAL_TABLET | ORAL | 4 refills | Status: AC

## 2024-02-27 MED ORDER — ROSUVASTATIN CALCIUM 10 MG PO TABS: ORAL_TABLET | ORAL | 3 refills | Status: AC

## 2024-02-27 MED ORDER — CARVEDILOL 3.125 MG PO TABS: ORAL_TABLET | ORAL | 3 refills | Status: AC

## 2024-02-27 NOTE — Patient Instructions (Signed)

## 2024-03-01 ENCOUNTER — Other Ambulatory Visit

## 2024-03-02 ENCOUNTER — Ambulatory Visit

## 2024-03-02 ENCOUNTER — Ambulatory Visit: Admitting: Oncology

## 2024-03-08 ENCOUNTER — Inpatient Hospital Stay: Attending: Oncology

## 2024-03-08 DIAGNOSIS — E538 Deficiency of other specified B group vitamins: Secondary | ICD-10-CM | POA: Insufficient documentation

## 2024-03-08 DIAGNOSIS — Z87891 Personal history of nicotine dependence: Secondary | ICD-10-CM | POA: Diagnosis not present

## 2024-03-08 DIAGNOSIS — Z86000 Personal history of in-situ neoplasm of breast: Secondary | ICD-10-CM | POA: Diagnosis not present

## 2024-03-08 DIAGNOSIS — M810 Age-related osteoporosis without current pathological fracture: Secondary | ICD-10-CM | POA: Insufficient documentation

## 2024-03-08 DIAGNOSIS — Z923 Personal history of irradiation: Secondary | ICD-10-CM | POA: Diagnosis not present

## 2024-03-08 DIAGNOSIS — D509 Iron deficiency anemia, unspecified: Secondary | ICD-10-CM | POA: Insufficient documentation

## 2024-03-08 LAB — CBC WITH DIFFERENTIAL/PLATELET
Abs Immature Granulocytes: 0.03 K/uL (ref 0.00–0.07)
Basophils Absolute: 0 K/uL (ref 0.0–0.1)
Basophils Relative: 1 %
Eosinophils Absolute: 0.1 K/uL (ref 0.0–0.5)
Eosinophils Relative: 1 %
HCT: 35.1 % — ABNORMAL LOW (ref 36.0–46.0)
Hemoglobin: 11.5 g/dL — ABNORMAL LOW (ref 12.0–15.0)
Immature Granulocytes: 0 %
Lymphocytes Relative: 27 %
Lymphs Abs: 2.1 K/uL (ref 0.7–4.0)
MCH: 26.9 pg (ref 26.0–34.0)
MCHC: 32.8 g/dL (ref 30.0–36.0)
MCV: 82.2 fL (ref 80.0–100.0)
Monocytes Absolute: 0.6 K/uL (ref 0.1–1.0)
Monocytes Relative: 8 %
Neutro Abs: 4.8 K/uL (ref 1.7–7.7)
Neutrophils Relative %: 63 %
Platelets: 238 K/uL (ref 150–400)
RBC: 4.27 MIL/uL (ref 3.87–5.11)
RDW: 15.6 % — ABNORMAL HIGH (ref 11.5–15.5)
WBC: 7.7 K/uL (ref 4.0–10.5)
nRBC: 0 % (ref 0.0–0.2)

## 2024-03-08 LAB — FERRITIN: Ferritin: 117 ng/mL (ref 11–307)

## 2024-03-08 LAB — IRON AND TIBC
Iron: 83 ug/dL (ref 28–170)
Saturation Ratios: 25 % (ref 10.4–31.8)
TIBC: 328 ug/dL (ref 250–450)
UIBC: 245 ug/dL

## 2024-03-09 ENCOUNTER — Inpatient Hospital Stay

## 2024-03-09 ENCOUNTER — Inpatient Hospital Stay (HOSPITAL_BASED_OUTPATIENT_CLINIC_OR_DEPARTMENT_OTHER): Admitting: Oncology

## 2024-03-09 ENCOUNTER — Encounter: Payer: Self-pay | Admitting: Oncology

## 2024-03-09 VITALS — BP 154/56 | HR 62 | Temp 98.2°F | Ht 60.0 in | Wt 120.0 lb

## 2024-03-09 DIAGNOSIS — D509 Iron deficiency anemia, unspecified: Secondary | ICD-10-CM | POA: Diagnosis not present

## 2024-03-09 NOTE — Progress Notes (Signed)
No Venofer today.

## 2024-03-09 NOTE — Progress Notes (Signed)
 " [facility]  [phone] [phone]  ID: Jenna Young OB: [birth year]  MR#: [id number]  RDW#:[id number]  Patient Care Team: Randeen Laine LABOR, MD as PCP - General Perla Evalene PARAS, MD as PCP - Cardiology (Cardiology) Jacobo Evalene PARAS, MD as Consulting Physician (Oncology) Fate Morna SAILOR, [facility] (Inactive) as Pharmacist (Pharmacist) Laurice Francis NOVAK, OD (Optometry)  CHIEF COMPLAINT: Iron deficiency anemia.  INTERVAL HISTORY: Patient returns to clinic today for repeat laboratory, further evaluation, and consideration of additional IV Feraheme .  She continues to feel well and remains asymptomatic.  She does not complain of any weakness or fatigue.  She had a fall over the Thanksgiving holiday secondary to diarrhea and dehydration, but otherwise has felt well.  She has no neurologic complaints.  She denies any recent fevers or illnesses. She has no chest pain, shortness of breath, cough, or hemoptysis.  She denies any nausea, vomiting, constipation, or diarrhea.  She has no melena or hematochezia.  She has no urinary complaints.  Patient offers no specific complaints today.  REVIEW OF SYSTEMS:   Review of Systems  Constitutional: Negative.  Negative for fever, malaise/fatigue and weight loss.  Respiratory: Negative.  Negative for cough and shortness of breath.   Cardiovascular: Negative.  Negative for chest pain and leg swelling.  Gastrointestinal: Negative.  Negative for abdominal pain, blood in stool and melena.  Genitourinary: Negative.  Negative for dysuria and hematuria.  Musculoskeletal: Negative.  Negative for back pain and myalgias.  Skin: Negative.  Negative for rash.  Neurological: Negative.  Negative for dizziness, focal weakness, weakness and headaches.  Psychiatric/Behavioral: Negative.  The patient is not nervous/anxious.     As per HPI. Otherwise, a complete review of systems is negative.  PAST MEDICAL HISTORY: Past Medical  History:  Diagnosis Date   Allergic rhinitis    Anemia    Anxiety    Breast cancer (HCC)    Left   CAD (coronary artery disease)    a. 02/2017 Cath: OM1 90, RCA 30p, 66m, 95d (unsuccessful PCI->Med Rx); b. 05/2019 NSTEMI/Cath: LM nl, LAD mild diff dzs, LCX mild diff dzs, OM1 100 - fills via collats from OM2, RCA 30p, 5m, 150m, RPDA fills via collats from dLAD.   Chronic HFrEF (heart failure with mid-range ejection fraction) (HCC)    a. 05/2019 Echo: EF 40-45%, mild LVH, GrI DD, nl RV fxn, mildly dil LA, triv MR/AI.   GERD (gastroesophageal reflux disease)    History of radiation therapy 04/29/16- 05/24/16   Left Breast 40.05 Gy in 15 fractions, Left Breast boost 10 Gy in 5 fractions.    Hyperlipidemia    Hypertension    treated by Dr Cleven recently with syncope episode   Interstitial lung disease (HCC)    Ischemic cardiomyopathy    Osteopenia    Osteoporosis    Personal history of radiation therapy 2018   Syncope     PAST SURGICAL HISTORY: Past Surgical History:  Procedure Laterality Date   APPENDECTOMY     BREAST BIOPSY Left 11/29/2008   BREAST BIOPSY Left 01/26/2016   malignant   BREAST EXCISIONAL BIOPSY Left 12/26/2008   BREAST LUMPECTOMY Left 02/27/2016   BREAST LUMPECTOMY Left 04/01/2016   BREAST LUMPECTOMY WITH RADIOACTIVE SEED LOCALIZATION Left 02/27/2016   Procedure: LEFT BREAST LUMPECTOMY WITH RADIOACTIVE SEED LOCALIZATION;  Surgeon: Deward Null III, MD;  Location: [facility];  Service: General;  Laterality: Left;   BREAST SURGERY  10/10   benign biopsy  negative  CESAREAN SECTION     CHOLECYSTECTOMY     CORONARY STENT INTERVENTION N/A 02/11/2017   Procedure: CORONARY STENT INTERVENTION;  Surgeon: Mady Bruckner, MD;  Location: [facility];  Service: Cardiovascular;  Laterality: N/A;   FEMUR FRACTURE SURGERY Right 2006   HIP ARTHROPLASTY Left 06/28/2021   Procedure: ARTHROPLASTY BIPOLAR HIP (HEMIARTHROPLASTY);  Surgeon: Rollene Cough, MD;  Location: [facility];  Service: Orthopedics;  Laterality: Left;   LEFT HEART CATH AND CORONARY ANGIOGRAPHY N/A 02/11/2017   Procedure: LEFT HEART CATH AND CORONARY ANGIOGRAPHY;  Surgeon: Mady Bruckner, MD;  Location: [facility];  Service: Cardiovascular;  Laterality: N/A;   LEFT HEART CATH AND CORONARY ANGIOGRAPHY N/A 05/21/2019   Procedure: LEFT HEART CATH AND CORONARY ANGIOGRAPHY;  Surgeon: Darron Deatrice LABOR, MD;  Location: [facility];  Service: Cardiovascular;  Laterality: N/A;   OVARIAN CYST SURGERY     RE-EXCISION OF BREAST LUMPECTOMY Left 04/01/2016   Procedure: RE-EXCISION OF LEFT BREAST MEDIAL MARGIN;  Surgeon: Deward Null III, MD;  Location: [facility];  Service: General;  Laterality: Left;    FAMILY HISTORY: Family History  Problem Relation Age of Onset   Hypertension Mother    Heart disease Father    Macular degeneration Sister    Diabetes Maternal Aunt    Cancer Maternal Aunt        throat    ADVANCED DIRECTIVES (Y/N):  N  HEALTH MAINTENANCE: Social History   Tobacco Use   Smoking status: Former    Current packs/day: 0.00    Average packs/day: 1 pack/day for 10.0 years (10.0 ttl pk-yrs)    Types: Cigarettes    Start date: 03/11/1958    Quit date: 03/11/1968    Years since quitting: 56.0   Smokeless tobacco: Never  Vaping Use   Vaping status: Never Used  Substance Use Topics   Alcohol use: No    Alcohol/week: 0.0 standard drinks of alcohol   Drug use: No     Colonoscopy:  PAP:  Bone density:  Lipid panel:  Allergies  Allergen Reactions   Buspirone Hcl     REACTION: HEART PALPITATIONS   Codeine     REACTION: nausea and vomiting   Hydrochlorothiazide      REACTION: syncope-possibly from dehydration   Iron     Oral iron -per pt made her pass out    Isosorbide  Nitrate Other (See Comments)    hypotension   Lansoprazole     REACTION: abd pain   Minocycline Hcl    Oxycodone  Other (See Comments)     Hallucinations    Penicillins     REACTION: mouth numbness Has patient had a PCN reaction causing immediate rash, facial/tongue/throat swelling, SOB or lightheadedness with hypotension: No Has patient had a PCN reaction causing severe rash involving mucus membranes or skin necrosis: No Has patient had a PCN reaction that required hospitalization: No Has patient had a PCN reaction occurring within the last 10 years: No If all of the above answers are NO, then may proceed with Cephalosporin use.   Phenergan  [Promethazine  Hcl] Other (See Comments)    Body shakes, hallucinations   Ramipril      Cough    Ranexa  [Ranolazine ]     Constipation    Sulfa Antibiotics Nausea Only   Rosuvastatin      Patient reports muscle pain, unable to take. Stopped around 07/22.    Current Outpatient Medications  Medication Sig Dispense Refill   aspirin  EC 81 MG tablet  Take 81 mg by mouth daily. Swallow whole.     Cholecalciferol  (VITAMIN D ) 2000 UNITS CAPS Take 1 capsule by mouth daily.     clopidogrel  (PLAVIX ) 75 MG tablet TAKE 1 TABLET(75 MG) BY MOUTH DAILY WITH BREAKFAST, crush pill if unable to swallow 90 tablet 4   cyanocobalamin  (VITAMIN B12) 1000 MCG/ML injection Inject 1 mL (1,000 mcg total) into the muscle every 2 (two) months.     docusate sodium  (COLACE) 100 MG capsule Take 2 capsules (200 mg total) by mouth 2 (two) times daily. 10 capsule 0   famotidine  (PEPCID ) 40 MG/5ML suspension Take 2.5 mLs (20 mg total) by mouth 2 (two) times daily. In mid morning and bedtime 50 mL 0   fexofenadine  (ALLEGRA  ALLERGY) 180 MG tablet Take 1 tablet (180 mg total) by mouth daily. 30 tablet 6   fluticasone  (FLONASE ) 50 MCG/ACT nasal spray Place 2 sprays into both nostrils daily. 16 g 6   losartan  (COZAAR ) 100 MG tablet TAKE 1 TABLET(100 MG) BY MOUTH DAILY 90 tablet 3   meclizine  (ANTIVERT ) 25 MG tablet Take 25 mg by mouth 2 (two) times daily as needed.     Multiple Vitamin (MULTIVITAMIN WITH MINERALS) TABS tablet  Take 1 tablet by mouth daily. 30 tablet 1   nitroGLYCERIN  (NITROSTAT ) 0.4 MG SL tablet PLACE 1 TABLET UNDER TONGUE EVERY 5 MIN AS NEEDED FOR CHEST PAIN IF NO RELIEF IN15 MIN CALL 911 (MAX 3 TABS) 25 tablet 4   omeprazole  (PRILOSEC) 20 MG capsule TAKE 1 CAPSULE(20 MG) BY MOUTH TWICE DAILY BEFORE A MEAL 180 capsule 1   rosuvastatin  (CRESTOR ) 10 MG tablet TAKE 1 TABLET(10 MG) BY MOUTH DAILY 90 tablet 3   Skin Protectants, Misc. (EUCERIN) cream Apply 1 application. topically as needed for dry skin.     Budeson-Glycopyrrol-Formoterol  (BREZTRI  AEROSPHERE) 160-9-4.8 MCG/ACT AERO Inhale 2 puffs into the lungs in the morning and at bedtime. (Patient not taking: Reported on 03/09/2024)     carvedilol  (COREG ) 3.125 MG tablet TAKE 1 TABLET(3.125 MG) BY MOUTH TWICE DAILY (Patient not taking: Reported on 03/09/2024) 180 tablet 3   No current facility-administered medications for this visit.    OBJECTIVE: Vitals:   03/09/24 1307 03/09/24 1320  BP: (!) 157/61 (!) 154/56  Pulse: 62   Temp: 98.2 F (36.8 C)   SpO2: 100%       Body mass index is 23.44 kg/m.    ECOG FS:0 - Asymptomatic  General: Well-developed, well-nourished, no acute distress. Eyes: Pink conjunctiva, anicteric sclera. HEENT: Normocephalic, moist mucous membranes. Lungs: No audible wheezing or coughing. Heart: Regular rate and rhythm. Abdomen: Soft, nontender, no obvious distention. Musculoskeletal: No edema, cyanosis, or clubbing. Neuro: Alert, answering all questions appropriately. Cranial nerves grossly intact. Skin: No rashes or petechiae noted. Psych: Normal affect.  LAB RESULTS:  Lab Results  Component Value Date   NA 131 (L) 02/05/2024   K 4.3 02/05/2024   CL 97 (L) 02/05/2024   CO2 22 02/05/2024   GLUCOSE 105 (H) 02/05/2024   BUN 11 02/05/2024   CREATININE 0.91 02/05/2024   CALCIUM  9.1 02/05/2024   PROT 7.5 02/05/2024   ALBUMIN 3.9 02/05/2024   AST 18 02/05/2024   ALT 9 02/05/2024   ALKPHOS 68 02/05/2024    BILITOT 0.5 02/05/2024   GFRNONAA 60 (L) 02/05/2024   GFRAA >60 05/21/2019    Lab Results  Component Value Date   WBC 7.7 03/08/2024   NEUTROABS 4.8 03/08/2024   HGB 11.5 (L) 03/08/2024  HCT 35.1 (L) 03/08/2024   MCV 82.2 03/08/2024   PLT 238 03/08/2024   Lab Results  Component Value Date   IRON 83 03/08/2024   TIBC 328 03/08/2024   IRONPCTSAT 25 03/08/2024   Lab Results  Component Value Date   FERRITIN 117 03/08/2024    STUDIES: No results found.  ASSESSMENT: Iron deficiency anemia.  PLAN:   Iron deficiency anemia: Patient's hemoglobin remains mildly decreased, but essentially stable at 11.5.  Iron stores continue to be within normal limits. Previously, all of her other laboratory work was either negative or within normal limits.  Patient last received IV Feraheme  on April 01, 2023.  She does not require additional treatment today.  Return to clinic in 4 months with repeat laboratory, further evaluation, and continuation of treatment if needed.  If patient's laboratory work remains stable at that time, can consider discharging from clinic.   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  Final pathology was noninvasive therefore she did not require chemotherapy.  Patient did not take tamoxifen given the ER/PR status of her tumor.  Her most recent mammogram on January 17, 2023 was reported as BI-RADS 1.  Repeat in November 2025.  These are ordered by her primary care physician.   B12 deficiency: Continue monthly B12 injections with primary care as needed.  Patient last received treatment on January 07, 2024. Hypertension: Chronic and unchanged.  Patient blood pressure remains moderately elevated.  Continue monitoring and treatment per primary care.   Patient expressed understanding and was in agreement with this plan. She also understands that She can call clinic at any time with any  questions, concerns, or complaints.    Cancer Staging  History of breast cancer Staging form: Breast, AJCC 7th Edition - Clinical stage from 01/26/2016: Stage 0 (Tis (DCIS), N0, M0) - Signed by Lanny Callander, MD on 02/26/2016 Laterality: Left Tumor grade (Scarff-Bloom-Richardson system): G2 Estrogen receptor status: Negative Progesterone receptor status: Negative - Pathologic: Stage 0 (Tis (DCIS), N0, cM0) - Unsigned   Evalene JINNY Reusing, MD   03/09/2024 1:45 PM     "

## 2024-03-10 ENCOUNTER — Encounter

## 2024-04-08 ENCOUNTER — Ambulatory Visit

## 2024-04-08 DIAGNOSIS — E538 Deficiency of other specified B group vitamins: Secondary | ICD-10-CM | POA: Diagnosis not present

## 2024-04-08 MED ORDER — CYANOCOBALAMIN 1000 MCG/ML IJ SOLN
1000.0000 ug | Freq: Once | INTRAMUSCULAR | Status: AC
  Administered 2024-04-08: 1000 ug via INTRAMUSCULAR

## 2024-04-08 NOTE — Progress Notes (Signed)
Per orders of Dr. Roxy Manns, injection of vitamin b 12 given by Lewanda Rife in left deltoid. Patient tolerated injection well. Patient will make appointment for 2 month.

## 2024-04-12 ENCOUNTER — Other Ambulatory Visit: Payer: Self-pay | Admitting: Cardiovascular Disease

## 2024-04-14 NOTE — Telephone Encounter (Signed)
 Refill sent

## 2024-05-28 ENCOUNTER — Ambulatory Visit

## 2024-06-03 ENCOUNTER — Ambulatory Visit

## 2024-07-05 ENCOUNTER — Inpatient Hospital Stay

## 2024-07-06 ENCOUNTER — Inpatient Hospital Stay

## 2024-07-06 ENCOUNTER — Inpatient Hospital Stay: Admitting: Oncology
